# Patient Record
Sex: Male | Born: 1970
Health system: Southern US, Community
[De-identification: ages and names within clinical notes are randomized; demographics above are authoritative.]

## PROBLEM LIST (undated history)

## (undated) DIAGNOSIS — Z923 Personal history of irradiation: Secondary | ICD-10-CM

## (undated) DIAGNOSIS — C7951 Secondary malignant neoplasm of bone: Secondary | ICD-10-CM

## (undated) DIAGNOSIS — R200 Anesthesia of skin: Secondary | ICD-10-CM

## (undated) DIAGNOSIS — Z9221 Personal history of antineoplastic chemotherapy: Secondary | ICD-10-CM

## (undated) DIAGNOSIS — C9000 Multiple myeloma not having achieved remission: Secondary | ICD-10-CM

## (undated) HISTORY — PX: ANTERIOR CRUCIATE LIGAMENT REPAIR: SHX115

---

## 2001-03-02 ENCOUNTER — Encounter: Payer: Self-pay | Admitting: General Practice

## 2001-03-02 ENCOUNTER — Encounter: Admission: RE | Admit: 2001-03-02 | Discharge: 2001-03-02 | Payer: Self-pay | Admitting: General Practice

## 2007-06-12 ENCOUNTER — Emergency Department (HOSPITAL_COMMUNITY): Admission: EM | Admit: 2007-06-12 | Discharge: 2007-06-12 | Payer: Self-pay | Admitting: Emergency Medicine

## 2008-07-05 ENCOUNTER — Ambulatory Visit: Admission: RE | Admit: 2008-07-05 | Discharge: 2008-08-12 | Payer: Self-pay | Admitting: Orthopaedic Surgery

## 2015-06-23 ENCOUNTER — Encounter (HOSPITAL_COMMUNITY): Payer: Self-pay | Admitting: *Deleted

## 2015-06-23 ENCOUNTER — Emergency Department (HOSPITAL_COMMUNITY): Payer: Self-pay

## 2015-06-23 ENCOUNTER — Inpatient Hospital Stay (HOSPITAL_COMMUNITY)
Admission: EM | Admit: 2015-06-23 | Discharge: 2015-06-28 | DRG: 478 | Disposition: A | Discharge status: Home / Self Care | Payer: Self-pay | Attending: Internal Medicine | Admitting: Internal Medicine

## 2015-06-23 DIAGNOSIS — M545 Low back pain, unspecified: Secondary | ICD-10-CM

## 2015-06-23 DIAGNOSIS — M8458XA Pathological fracture in neoplastic disease, other specified site, initial encounter for fracture: Secondary | ICD-10-CM | POA: Diagnosis present

## 2015-06-23 DIAGNOSIS — M549 Dorsalgia, unspecified: Secondary | ICD-10-CM

## 2015-06-23 DIAGNOSIS — Z79899 Other long term (current) drug therapy: Secondary | ICD-10-CM

## 2015-06-23 DIAGNOSIS — D630 Anemia in neoplastic disease: Secondary | ICD-10-CM | POA: Diagnosis present

## 2015-06-23 DIAGNOSIS — D649 Anemia, unspecified: Secondary | ICD-10-CM | POA: Diagnosis present

## 2015-06-23 DIAGNOSIS — R270 Ataxia, unspecified: Secondary | ICD-10-CM | POA: Diagnosis present

## 2015-06-23 DIAGNOSIS — T380X5A Adverse effect of glucocorticoids and synthetic analogues, initial encounter: Secondary | ICD-10-CM | POA: Diagnosis present

## 2015-06-23 DIAGNOSIS — G9520 Unspecified cord compression: Secondary | ICD-10-CM | POA: Diagnosis present

## 2015-06-23 DIAGNOSIS — D72829 Elevated white blood cell count, unspecified: Secondary | ICD-10-CM | POA: Diagnosis present

## 2015-06-23 DIAGNOSIS — C7951 Secondary malignant neoplasm of bone: Principal | ICD-10-CM | POA: Diagnosis present

## 2015-06-23 DIAGNOSIS — D492 Neoplasm of unspecified behavior of bone, soft tissue, and skin: Secondary | ICD-10-CM | POA: Diagnosis present

## 2015-06-23 DIAGNOSIS — K5900 Constipation, unspecified: Secondary | ICD-10-CM | POA: Diagnosis not present

## 2015-06-23 DIAGNOSIS — D638 Anemia in other chronic diseases classified elsewhere: Secondary | ICD-10-CM | POA: Diagnosis present

## 2015-06-23 DIAGNOSIS — M4804 Spinal stenosis, thoracic region: Secondary | ICD-10-CM | POA: Diagnosis present

## 2015-06-23 DIAGNOSIS — Z87891 Personal history of nicotine dependence: Secondary | ICD-10-CM

## 2015-06-23 MED ORDER — GABAPENTIN 300 MG PO CAPS
300.0000 mg | ORAL_CAPSULE | Freq: Once | ORAL | Status: AC
  Administered 2015-06-23: 300 mg via ORAL
  Filled 2015-06-23: qty 1
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
## 2015-06-23 NOTE — ED Notes (Signed)
Pt states he is NOT claustrophobic
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
## 2015-06-23 NOTE — ED Notes (Signed)
Pt returned from MRI °

## 2015-06-24 ENCOUNTER — Emergency Department (HOSPITAL_COMMUNITY): Payer: Self-pay

## 2015-06-24 ENCOUNTER — Observation Stay (HOSPITAL_COMMUNITY): Payer: Self-pay

## 2015-06-24 ENCOUNTER — Emergency Department (HOSPITAL_COMMUNITY): Payer: No Typology Code available for payment source

## 2015-06-24 ENCOUNTER — Encounter (HOSPITAL_COMMUNITY): Payer: Self-pay | Admitting: Family Medicine

## 2015-06-24 DIAGNOSIS — D630 Anemia in neoplastic disease: Secondary | ICD-10-CM | POA: Diagnosis present

## 2015-06-24 DIAGNOSIS — D72829 Elevated white blood cell count, unspecified: Secondary | ICD-10-CM | POA: Diagnosis present

## 2015-06-24 DIAGNOSIS — C7951 Secondary malignant neoplasm of bone: Secondary | ICD-10-CM | POA: Diagnosis present

## 2015-06-24 DIAGNOSIS — D492 Neoplasm of unspecified behavior of bone, soft tissue, and skin: Secondary | ICD-10-CM

## 2015-06-24 LAB — COMPREHENSIVE METABOLIC PANEL
ALBUMIN: 4.2 g/dL (ref 3.5–5.0)
ALK PHOS: 40 U/L (ref 38–126)
ALT: 18 U/L (ref 17–63)
ANION GAP: 8 (ref 5–15)
AST: 24 U/L (ref 15–41)
BUN: 13 mg/dL (ref 6–20)
CALCIUM: 9.9 mg/dL (ref 8.9–10.3)
CO2: 23 mmol/L (ref 22–32)
Chloride: 109 mmol/L (ref 101–111)
Creatinine, Ser: 0.96 mg/dL (ref 0.61–1.24)
GFR calc Af Amer: >60 mL/min (ref >60)
GFR calc non Af Amer: >60 mL/min (ref >60)
GLUCOSE: 99 mg/dL (ref 65–99)
Potassium: 4.1 mmol/L (ref 3.5–5.1)
SODIUM: 140 mmol/L (ref 135–145)
Total Bilirubin: 1 mg/dL (ref 0.3–1.2)
Total Protein: 6.7 g/dL (ref 6.5–8.1)

## 2015-06-24 LAB — APTT: APTT: 28 s (ref 24–37)

## 2015-06-24 LAB — CBC WITH DIFFERENTIAL/PLATELET
BASOS PCT: 0 %
Basophils Absolute: 0 10*3/uL (ref 0.0–0.1)
Eosinophils Absolute: 0 10*3/uL (ref 0.0–0.7)
Eosinophils Relative: 0 %
HEMATOCRIT: 31.3 % — AB (ref 39.0–52.0)
Hemoglobin: 10.6 g/dL — ABNORMAL LOW (ref 13.0–17.0)
Lymphocytes Relative: 19 %
Lymphs Abs: 2.3 10*3/uL (ref 0.7–4.0)
MCH: 30.5 pg (ref 26.0–34.0)
MCHC: 33.9 g/dL (ref 30.0–36.0)
MCV: 90.2 fL (ref 78.0–100.0)
MONO ABS: 1.3 10*3/uL — AB (ref 0.1–1.0)
MONOS PCT: 10 %
NEUTROS ABS: 9 10*3/uL — AB (ref 1.7–7.7)
Neutrophils Relative %: 71 %
Platelets: 215 10*3/uL (ref 150–400)
RBC: 3.47 MIL/uL — ABNORMAL LOW (ref 4.22–5.81)
RDW: 13.6 % (ref 11.5–15.5)
WBC: 12.6 10*3/uL — ABNORMAL HIGH (ref 4.0–10.5)

## 2015-06-24 LAB — IRON AND TIBC
IRON: 116 ug/dL (ref 45–182)
SATURATION RATIOS: 45 % — AB (ref 17.9–39.5)
TIBC: 256 ug/dL (ref 250–450)
UIBC: 140 ug/dL

## 2015-06-24 LAB — FERRITIN: FERRITIN: 307 ng/mL (ref 24–336)

## 2015-06-24 LAB — PROTIME-INR
INR: 1.14 (ref 0.00–1.49)
Prothrombin Time: 14.8 seconds (ref 11.6–15.2)

## 2015-06-24 LAB — RETICULOCYTES
RBC.: 3.3 MIL/uL — ABNORMAL LOW (ref 4.22–5.81)
RETIC CT PCT: 1.2 % (ref 0.4–3.1)
Retic Count, Absolute: 39.6 10*3/uL (ref 19.0–186.0)

## 2015-06-24 LAB — LACTATE DEHYDROGENASE: LDH: 187 U/L (ref 98–192)

## 2015-06-24 LAB — PSA: PSA: 0.78 ng/mL (ref 0.00–4.00)

## 2015-06-24 LAB — ABO/RH: ABO/RH(D): A POS

## 2015-06-24 MED ORDER — DIATRIZOATE MEGLUMINE & SODIUM 66-10 % PO SOLN
ORAL | Status: AC
  Filled 2015-06-24: qty 30

## 2015-06-24 MED ORDER — OXYCODONE HCL 5 MG PO TABS
5.0000 mg | ORAL_TABLET | ORAL | Status: DC | PRN
  Administered 2015-06-24 (×2): 5 mg via ORAL
  Filled 2015-06-24 (×3): qty 1

## 2015-06-24 MED ORDER — DEXAMETHASONE SODIUM PHOSPHATE 4 MG/ML IJ SOLN
4.0000 mg | Freq: Four times a day (QID) | INTRAMUSCULAR | Status: DC
  Administered 2015-06-24 – 2015-06-28 (×15): 4 mg via INTRAVENOUS
  Filled 2015-06-24 (×16): qty 1

## 2015-06-24 MED ORDER — ACETAMINOPHEN 650 MG RE SUPP: 650.0000 mg | Freq: Four times a day (QID) | RECTAL | Status: DC | PRN

## 2015-06-24 MED ORDER — GADOBENATE DIMEGLUMINE 529 MG/ML IV SOLN
20.0000 mL | Freq: Once | INTRAVENOUS | Status: AC | PRN
  Administered 2015-06-24: 20 mL via INTRAVENOUS

## 2015-06-24 MED ORDER — ENOXAPARIN SODIUM 40 MG/0.4ML ~~LOC~~ SOLN
40.0000 mg | SUBCUTANEOUS | Status: DC
  Administered 2015-06-26 – 2015-06-28 (×3): 40 mg via SUBCUTANEOUS
  Filled 2015-06-24 (×3): qty 0.4

## 2015-06-24 MED ORDER — ACETAMINOPHEN 325 MG PO TABS: 650.0000 mg | ORAL_TABLET | Freq: Four times a day (QID) | ORAL | Status: DC | PRN

## 2015-06-24 MED ORDER — HYDROMORPHONE HCL 1 MG/ML IJ SOLN
1.0000 mg | INTRAMUSCULAR | Status: AC | PRN
  Administered 2015-06-24: 1 mg via INTRAVENOUS
  Filled 2015-06-24: qty 1

## 2015-06-24 MED ORDER — ONDANSETRON HCL 4 MG/2ML IJ SOLN: 4.0000 mg | Freq: Three times a day (TID) | INTRAMUSCULAR | Status: AC | PRN

## 2015-06-24 MED ORDER — IOPAMIDOL (ISOVUE-300) INJECTION 61%
INTRAVENOUS | Status: AC
  Administered 2015-06-24: 100 mL
  Filled 2015-06-24: qty 100

## 2015-06-24 MED ORDER — DEXAMETHASONE SODIUM PHOSPHATE 4 MG/ML IJ SOLN
4.0000 mg | Freq: Two times a day (BID) | INTRAMUSCULAR | Status: DC
  Administered 2015-06-24: 4 mg via INTRAVENOUS
  Filled 2015-06-24: qty 1

## 2015-06-24 MED ORDER — IBUPROFEN 200 MG PO TABS: 600.0000 mg | ORAL_TABLET | Freq: Four times a day (QID) | ORAL | Status: DC | PRN

## 2015-06-24 MED ORDER — SENNOSIDES-DOCUSATE SODIUM 8.6-50 MG PO TABS
1.0000 | ORAL_TABLET | Freq: Every evening | ORAL | Status: DC | PRN
  Administered 2015-06-27: 1 via ORAL
  Filled 2015-06-24: qty 1

## 2015-06-24 NOTE — Progress Notes (Addendum)
Patient ID: Bradley Hunt, male   DOB: Apr 07, 1970, 45 y.o.   MRN: [id number]  PROGRESS NOTE    Bradley Hunt  OFV:[id number] DOB: 04-Nov-1970 DOA: 06/23/2015  PCP: Barbette Merino, MD   Brief Narrative:  45 y.o. male with no signficant past medical history who presented to [facility] with back pain, ataxia and leg numbness for past 1 month prior to this admission. Patient has been seen by primary care physician who prescribed NSAIDs and exercise but the pain has gotten progressively worse to the point where patient could not ambulate normally and it interfered with his sleep. Patient reported numbness in the trunk area, abdomen and has had fecal incontinence. His primary care physician subsequently prescribed Decadron and referred him to ER for evaluation.  Findings on MRI suggestive of osseous metastatic disease with pathologic fracture in T6. Neurosurgery consulted by ED physician. We also place consult for interventional radiology if there is a possibility to do bone marrow biopsy to establish primary diagnosis.  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 - Will order SPEP, UPEP - Based on MRI multiple metastatic lesions in the bones with pathologic fracture in T6 area  - Because of the pain and because of neurological compromise specifically numbness in the truncal area and reports of fecal incontinence we will use Decadron 4 mg IV every 12 hours - Appreciate neurosurgery consult and recommendations - Obtain consult with interventional radiology to see if bone marrow biopsy possible - Continue pain management efforts - Addendum 1:45 pm - spoke with dr. Kathyrn Sheriff, fair degree of spinal cord compression so laminectomy better and no need for IR so canceled consult for IR bone marrow  biopsy; also increased decadron to Q 6 hours regimen    DVT prophylaxis: Lovenox subcutaneous Code Status: full code  Family Communication: Family not at the bedside Disposition Plan: Home once patient evaluated by neurosurgery and physical therapy   Consultants:   Neurosurgery   Procedures:   None   Antimicrobials:   None    Subjective: Other than pain patient feels pretty okay. His pain is relatively controlled with current analgesia.  Objective: Filed Vitals:   06/24/15 0530 06/24/15 0545 06/24/15 0615 06/24/15 0651  BP: 125/84 125/86 148/97 137/85  Pulse: 54 56 58 57  Temp:    97.8 F (36.6 C)  TempSrc:    Oral  Resp:      SpO2: 100% 100% 100% 100%   No intake or output data in the 24 hours ending 06/24/15 0909 There were no vitals filed for this visit.  Examination:  General exam: Appears calm and comfortable  Respiratory system: Clear to auscultation. Respiratory effort normal. Cardiovascular system: S1 & S2 heard, RRR. No JVD, murmurs, rubs, gallops or clicks. No pedal edema. Gastrointestinal system: Abdomen is nondistended, soft and nontender. No organomegaly or masses felt. Normal bowel sounds heard. Central nervous system: Alert and oriented. No focal neurological deficits. Able to lift upper and lower extremities, no sensation loss in UE or LE; he does have numbness in the trunk area, abdomen. Extremities: Symmetric 5 x 5 power. Skin: No rashes, lesions or ulcers Psychiatry: Judgement and insight appear normal. Mood & affect appropriate.   Data Reviewed: I have personally reviewed following labs and imaging studies  CBC:  Recent Labs Lab 06/24/15 0437  WBC 12.6*  NEUTROABS 9.0*  HGB 10.6*  HCT 31.3*  MCV 90.2  PLT 509   Basic Metabolic Panel:  Recent Labs Lab 06/24/15 0437  NA 140  K 4.1  CL 109  CO2 23  GLUCOSE 99  BUN 13  CREATININE 0.96  CALCIUM 9.9   GFR: CrCl cannot be calculated (Unknown ideal weight.). Liver Function  Tests:  Recent Labs Lab 06/24/15 0437  AST 24  ALT 18  ALKPHOS 40  BILITOT 1.0  PROT 6.7  ALBUMIN 4.2   No results for input(s): LIPASE, AMYLASE in the last 168 hours. No results for input(s): AMMONIA in the last 168 hours. Coagulation Profile: No results for input(s): INR, PROTIME in the last 168 hours. Cardiac Enzymes: No results for input(s): CKTOTAL, CKMB, CKMBINDEX, TROPONINI in the last 168 hours. BNP (last 3 results) No results for input(s): PROBNP in the last 8760 hours. HbA1C: No results for input(s): HGBA1C in the last 72 hours. CBG: No results for input(s): GLUCAP in the last 168 hours. Lipid Profile: No results for input(s): CHOL, HDL, LDLCALC, TRIG, CHOLHDL, LDLDIRECT in the last 72 hours. Thyroid Function Tests: No results for input(s): TSH, T4TOTAL, FREET4, T3FREE, THYROIDAB in the last 72 hours. Anemia Panel: No results for input(s): VITAMINB12, FOLATE, FERRITIN, TIBC, IRON, RETICCTPCT in the last 72 hours. Urine analysis: No results found for: COLORURINE, APPEARANCEUR, LABSPEC, PHURINE, GLUCOSEU, HGBUR, BILIRUBINUR, KETONESUR, PROTEINUR, UROBILINOGEN, NITRITE, LEUKOCYTESUR Sepsis Labs: _0 (procalcitonin:4,lacticidven:4)   )No results found for this or any previous visit (from the past 240 hour(s)).    Radiology Studies: Mr Thoracic Spine W Wo Contrast 06/24/2015  ADDENDUM REPORT: 06/24/2015 07:56 ADDENDUM: Upon further review, there is a probable additional discrete lesion within the right posterior aspect of the T1 vertebral body measuring approximately 15 mm (series 3, image 7). Additional lesion within the right superior aspect of the T11 vertebral body measures approximately 17 mm (series 3, image 7). Probable tiny 7 mm lesion within the posterior aspect of T8 (series 3, image 8). No associated pathologic fracture or extra osseous extension of tumor with these additional lesions. In fact, heterogeneous marrow signal intensity seen on axial merge  sequences is highly concerning for diffuse osseous metastases even though these are not discretely delineated on STIR and post-contrast sequences. Probable involvement of the bilateral ribs noted. Benign hemangioma and with mild chronic height loss noted at T4.   06/24/2015  1. Abnormal appearance of the T6 vertebral body, highly suspicious for possible osseous metastasis. Associated pathologic fracture with up to 30% height loss. There is associated abnormal soft tissue density within the ventral epidural space, concerning for epidural tumor. Associated fairly severe canal stenosis. Question mild edema within the thoracic spinal cord related to compression at this level. 2. Question additional lesion involving the T9 vertebral body as above. No other definite focal lesions within the thoracic spine. Electronically Signed: By: Jeannine Boga M.D. On: 06/24/2015 03:41   Mr Lumbar Spine W Wo Contrast 06/24/2015  1. Focal osseous lesion with abnormal enhancement involving the right pedicle of L3, suspicious for possible osseous metastasis given the findings in the thoracic spine. Question additional focal lesion within the right iliac wing as above. No pathologic fracture. 2. Right subarticular disc protrusion at L4-5, impinging upon the right L5 nerve root in the right lateral recess. 3. Shallow central disc protrusion at L5-S1 without stenosis or neural impingement.     Scheduled Meds: . diatrizoate meglumine-sodium      . enoxaparin (LOVENOX) injection  40 mg Subcutaneous Q24H  . iopamidol       Continuous Infusions:      Time spent: 25 minutes  Greater than 50% of the time spent on counseling and coordinating the care.   Leisa Lenz, MD [facility] Pager [phone]  If 7PM-7AM, please contact night-coverage [email or web address] Password [email or web address] 06/24/2015, 9:09 AM

## 2015-06-24 NOTE — Consult Note (Signed)
CC:  Chief Complaint  Patient presents with  . Back Pain    HPI: Bradley Hunt is a 45 y.o. male sent to the ED by his PCP after recent outpatient MRI demonstrated T6 bony lesion with spinal cord compression. His history begins about two months ago when he began experiencing some "tightness" around his chest just below the nipple level, with decreased sensation below this area down into the abdomen and legs. Over the last two weeks he says symptoms have significantly worsened. He began to have difficulty with his balance, unable to walk in a straight line. He does report some back pain, although it is not exacerbated by movement nor is it his primary complaint. He reports two episodes in which he felt urgency to defecate but couldn't hold it. He does not report any urinary/bladder symptoms of urgency or frequency. He has no significant medical history.  PMH: History reviewed. No pertinent past medical history.  PSH: Past Surgical History  Procedure Laterality Date  . Anterior cruciate ligament repair      SH: Social History  Substance Use Topics  . Smoking status: Former Research scientist (life sciences)  . Smokeless tobacco: None  . Alcohol Use: No    MEDS: Prior to Admission medications   Medication Sig Start Date End Date Taking? Authorizing Provider  dexamethasone (DECADRON) 2 MG tablet Take 6 mg by mouth daily.   Yes Historical Provider, MD  ibuprofen (ADVIL,MOTRIN) 200 MG tablet Take 200-400 mg by mouth every 6 (six) hours as needed for moderate pain.   Yes Historical Provider, MD    ALLERGY: No Known Allergies  ROS: ROS  NEUROLOGIC EXAM: Awake, alert, oriented Memory and concentration grossly intact Speech fluent, appropriate CN grossly intact Motor exam: Upper Extremities Deltoid Bicep Tricep Grip  Right 5/5 5/5 5/5 5/5  Left 5/5 5/5 5/5 5/5   Lower Extremity IP Quad PF DF EHL  Right 5/5 5/5 5/5 5/5 5/5  Left 4+/5 5/5 5/5 5/5 5/5   Sensation decreased to LT below T6  bilaterally  IMGAING: MRI T spine demonstrates mass involving the body of T6 with extension into the ventral epidural space and resultant severe spinal cord compression with bright T2 signal change within the cord.  CT chest also demonstrates lytic appearance of the T6 vertebral body with possible extension into the posterior elements.  IMPRESSION: - 45 y.o. 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 The likely diagnosis of metastatic disease was discussed. I did recommend surgical decompression given the degree of compression and the need for diagnosis. Risks of surgery were discussed including spinal cord injury leading to worsening weakness/paralysis, bladder dysfunction, bleeding, infection, csf leak. The patient appeared to understand our discussion and is willing to proceed. All questions were answered.

## 2015-06-24 NOTE — H&P (Signed)
History and Physical  Patient Name: Bradley Hunt     [id number]    DOB: [birth year]    DOA: 06/23/2015 PCP: Barbette Merino, MD   Patient coming from: Home  Chief Complaint: Ataxia and back pain  HPI: Bradley Hunt is a 45 y.o. male with no signficant past medical history who presents with back pain and ataxia and leg numbness.  The patient was in his usual health until about 1 month ago when he developed upper back pain, radiating around and "tight all across my chest" associated with some numbness, difficulty falling asleep.  He saw his PCP who prescribed NSAIDs and exercise (he has a history of some back pain that has been treated with injections in the past), but the pain was still severe, and in fact progressed.  Now over the last two weeks, he has had ataxia, his co-workers have noticed he "walks like I'm drunk", he has developed leg weakness and numbness to the trunk bilaterally and across the abdomen, anterior and inner thighs, and he has had fecal incontinence.  His PCP prescribed dexamethasone yesterday and today told him to go to the ER for an MRI.  ED course: -Afebrile, hemodynamically stable -Na 140, K 4.1, Cr 0.96, WBC 12.6K, Hgb 10.6 normocytic -An MRI of the thoracic and lumbar spine showed T6 compression fracture with height loss and severe canal stenosis, multiple foci of abnormal enhancement concerning for osseous metastasis  The case was discussed with Dr. Kathyrn Sheriff who recommended Medicine admission for cancer of unknown primary workup.     Review of Systems:  Pt complains of back pain, leg weakness, ataxia, fecal incontinence, numbness. Pt denies any weight loss, cough, night sweats.  All other systems negative except as just noted or noted in the history of present illness.    History reviewed. No pertinent past medical history.  Past Surgical History  Procedure Laterality Date  . Anterior cruciate ligament repair      Social History: Patient lives with his  wife and three children ages 38, 86, and 3.  The patient walks unassisted.  He is a Administrator.  He was born in [place], moved here at age 52.  Former smoker.    No Known Allergies  Family history: family history is negative for Cancer.  Prior to Admission medications   Medication Sig Start Date End Date Taking? Authorizing Provider  dexamethasone (DECADRON) 2 MG tablet Take 6 mg by mouth daily.   Yes Historical Provider, MD  ibuprofen (ADVIL,MOTRIN) 200 MG tablet Take 200-400 mg by mouth every 6 (six) hours as needed for moderate pain.   Yes Historical Provider, MD       Physical Exam: BP 148/97 mmHg  Pulse 58  Temp(Src) 98.3 F (36.8 C) (Oral)  Resp 18  SpO2 100% General appearance: Well-developed, adult male, alert and in no acute distress.   Eyes: Anicteric, conjunctiva pink, lids and lashes normal.     ENT: No nasal deformity, discharge, or epistaxis.  OP moist without lesions.  No thyromegaly. Skin: Warm and dry.  No suspicious rashes or lesions. Cardiac: RRR, nl S1-S2, no murmurs appreciated.  Respiratory: Normal respiratory rate and rhythm.  CTAB without rales or wheezes. Abdomen: Abdomen soft without rigidity.  No TTP. No ascites, distension.   MSK: No deformities or effusions. Neuro: Cranial nerves normal.  Numbness to light touch over abdomen, anterior thighs.  Patellar reflexes 2_ and symmetric.  Sensorium intact and responding to questions, attention normal.  Speech is fluent.  Upper extremity  strength 5/5 and symmetric, lower extremity strength feels same to me but patient feels weakness.   Psych: Behavior appropriate.  Affect normal.  No evidence of aural or visual hallucinations or delusions.       Labs on Admission:  I have personally reviewed following labs and imaging studies: CBC:  Recent Labs Lab 06/24/15 0437  WBC 12.6*  NEUTROABS 9.0*  HGB 10.6*  HCT 31.3*  MCV 90.2  PLT 123456   Basic Metabolic Panel:  Recent Labs Lab 06/24/15 0437  NA 140  K  4.1  CL 109  CO2 23  GLUCOSE 99  BUN 13  CREATININE 0.96  CALCIUM 9.9   GFR: CrCl cannot be calculated (Unknown ideal weight.). Liver Function Tests:  Recent Labs Lab 06/24/15 0437  AST 24  ALT 18  ALKPHOS 40  BILITOT 1.0  PROT 6.7  ALBUMIN 4.2    Radiological Exams on Admission: Personally reviewed: Mr Thoracic Spine W Wo Contrast  06/24/2015  CLINICAL DATA:  Initial evaluation for acute back pain. EXAM: MRI THORACIC SPINE WITHOUT AND WITH CONTRAST TECHNIQUE: Multiplanar and multiecho pulse sequences of the thoracic spine were obtained without and with intravenous contrast. CONTRAST:  13mL MULTIHANCE GADOBENATE DIMEGLUMINE 529 MG/ML IV SOLN COMPARISON:  None available. FINDINGS: Vertebral bodies are normally aligned with preservation of the normal thoracic kyphosis. No listhesis or malalignment. There is abnormal height loss with heterogeneity and involving the T6 vertebral body. Abnormal posterior convex bowing of the posterior vertebral body contour more with abnormal soft tissue density within the ventral epidural space. This enhances mildly on post gadolinium sequence. Finding suspicious for possible osseous metastasis with associated pathologic fracture. There is associated 30% height loss. Abnormal soft tissue density within the ventral epidural space abuts the thoracic spinal cord and results in moderate to severe canal stenosis. Thecal sac measures approximately 8-9 mm in AP diameter at this level. Probable extension of tumor and to the posterior elements of T6, most evident within the right pedicle and lamina. Extension of tumor into the T5-6 neural foramina, particularly on the right (series 3, image 7). There is suggestion of possible additional ovoid lesion within the T9 vertebral body (series 3, image 11). This measures approximately 15 mm and spans the entirety of the vertebral body. No other definite osseous lesions identified. Possible hemangioma noted within the T4  vertebral body. Question of faint edema/myelomalacia within the thoracic spinal cord at the level of T6 related to compression (Series 3, image 10). Signal intensity within the thoracic spinal cord is otherwise normal. Paraspinous soft tissues demonstrate no other acute abnormality. Partially visualized lungs are clear. No significant degenerative changes within the thoracic spine. No focal disc herniation. No other significant stenosis. IMPRESSION: 1. Abnormal appearance of the T6 vertebral body, highly suspicious for possible osseous metastasis. Associated pathologic fracture with up to 30% height loss. There is associated abnormal soft tissue density within the ventral epidural space, concerning for epidural tumor. Associated fairly severe canal stenosis. Question mild edema within the thoracic spinal cord related to compression at this level. 2. Question additional lesion involving the T9 vertebral body as above. No other definite focal lesions within the thoracic spine. Electronically Signed   By: Jeannine Boga M.D.   On: 06/24/2015 03:41   Mr Lumbar Spine W Wo Contrast  06/24/2015  CLINICAL DATA:  Initial evaluation for acute back pain. EXAM: MRI LUMBAR SPINE WITHOUT AND WITH CONTRAST TECHNIQUE: Multiplanar and multiecho pulse sequences of the lumbar spine were obtained without and with  intravenous contrast. CONTRAST:  20 cc of MultiHance. COMPARISON:  Prior study from 06/18/2011. FINDINGS: Vertebral bodies are normally aligned with preservation of the normal lumbar lordosis. Vertebral body heights are well preserved. No fracture or malalignment. There is abnormal STIR signal intensity with enhancement involving the right pedicle of L3 (series 3, image 4), concerning for possible focal osseous lesion. This is not seen on previous MRI from 2013. Possible additional lesion within the right iliac wing adjacent to the SI joint (series 4, image 34). No other focal osseous lesions. Signal intensity within  the vertebral body bone marrow otherwise within normal limits. Vertebral body heights are preserved. No acute or chronic fracture. Degenerative endplate Schmorl's node present at the superior endplate of L5. Conus medullaris terminates at the L2 level. Signal intensity within the visualized cord is normal. Nerve roots of the cauda equina within normal limits. Paraspinous soft tissues demonstrate no acute abnormality. L1-2: No significant disc bulge or focal disc herniation. No stenosis. L2-3:  No disc herniation or significant disc bulge.  No stenosis. L3-4: No disc herniation are significant disc bulge. No significant stenosis. L4-5: Right subarticular disc protrusion encroaching upon the right lateral recess, impinging upon the transiting L5 nerve root (series 14, image 29). Moderate lateral recess stenosis with mild canal narrowing. Foramina remain widely patent. L5-S1: Shallow central disc protrusion with associated annular fissure. No significant stenosis or neural impingement. Foramina remain patent. IMPRESSION: 1. Focal osseous lesion with abnormal enhancement involving the right pedicle of L3, suspicious for possible osseous metastasis given the findings in the thoracic spine. Question additional focal lesion within the right iliac wing as above. No pathologic fracture. 2. Right subarticular disc protrusion at L4-5, impinging upon the right L5 nerve root in the right lateral recess. 3. Shallow central disc protrusion at L5-S1 without stenosis or neural impingement. Electronically Signed   By: Jeannine Boga M.D.   On: 06/24/2015 03:53       Assessment/Plan 1. Vertebral compression fracture with concern for bony metastasis: No family history of cancer and no localizing symptoms.  Anemia non-specific.    -CT chest abdomen and pelvis with IV contrast ordered -PSA ordered -Consult to Neurosurgery -Ibuprofen, acetaminophen, oxycodone, or hydromorphone PRN for pain   2. Leukocytosis:    Presumed reactive.  3. Anemia, unspecified type:  -Check iron studies, reticulocytes, SPEP    DVT prophylaxis: Lovenox  Code Status: FULL  Family Communication: None present  Disposition Plan: Anticipate evaluation by Neurosurgery today and disposition based on their plan.  CT imaging and lab work up pending. Consults called: Neurosurgery Admission status: Observation for now, med surg bed   Medical decision making: Patient seen at 6:20 AM on 06/24/2015.  The patient was discussed with Arlean Hopping, PA-C. What exists of the patient's chart was reviewed in depth.  Clinical condition: stable.        Edwin Dada [facility] Pager [phone]

## 2015-06-25 ENCOUNTER — Inpatient Hospital Stay (HOSPITAL_COMMUNITY): Payer: No Typology Code available for payment source

## 2015-06-25 ENCOUNTER — Inpatient Hospital Stay (HOSPITAL_COMMUNITY): Payer: Self-pay | Admitting: Anesthesiology

## 2015-06-25 ENCOUNTER — Encounter (HOSPITAL_COMMUNITY)
Admission: EM | Disposition: A | Discharge status: Home / Self Care | Payer: Self-pay | Source: Home / Self Care | Attending: Internal Medicine

## 2015-06-25 ENCOUNTER — Inpatient Hospital Stay (HOSPITAL_COMMUNITY): Payer: No Typology Code available for payment source | Admitting: Anesthesiology

## 2015-06-25 DIAGNOSIS — M8448XS Pathological fracture, other site, sequela: Secondary | ICD-10-CM

## 2015-06-25 DIAGNOSIS — G9520 Unspecified cord compression: Secondary | ICD-10-CM

## 2015-06-25 DIAGNOSIS — C9000 Multiple myeloma not having achieved remission: Secondary | ICD-10-CM

## 2015-06-25 HISTORY — PX: LAMINECTOMY: SHX219

## 2015-06-25 HISTORY — DX: Multiple myeloma not having achieved remission: C90.00

## 2015-06-25 LAB — POCT I-STAT 4, (NA,K, GLUC, HGB,HCT)
GLUCOSE: 123 mg/dL — AB (ref 65–99)
HEMATOCRIT: 25 % — AB (ref 39.0–52.0)
Hemoglobin: 8.5 g/dL — ABNORMAL LOW (ref 13.0–17.0)
POTASSIUM: 4.2 mmol/L (ref 3.5–5.1)
Sodium: 142 mmol/L (ref 135–145)

## 2015-06-25 LAB — SURGICAL PCR SCREEN
MRSA, PCR: NEGATIVE
STAPHYLOCOCCUS AUREUS: POSITIVE — AB

## 2015-06-25 LAB — PREPARE RBC (CROSSMATCH)

## 2015-06-25 SURGERY — LUMBAR LAMINECTOMY FOR TUMOR
Anesthesia: General | Site: Back

## 2015-06-25 MED ORDER — LACTATED RINGERS IV SOLN
INTRAVENOUS | Status: DC | PRN
  Administered 2015-06-25 (×3): via INTRAVENOUS

## 2015-06-25 MED ORDER — OXYCODONE HCL 5 MG PO TABS: 5.0000 mg | ORAL_TABLET | Freq: Once | ORAL | Status: DC | PRN

## 2015-06-25 MED ORDER — SUGAMMADEX SODIUM 500 MG/5ML IV SOLN
INTRAVENOUS | Status: AC
  Filled 2015-06-25: qty 5

## 2015-06-25 MED ORDER — MIDAZOLAM HCL 2 MG/2ML IJ SOLN
INTRAMUSCULAR | Status: DC | PRN
  Administered 2015-06-25: 2 mg via INTRAVENOUS

## 2015-06-25 MED ORDER — BUPIVACAINE HCL 0.5 % IJ SOLN
INTRAMUSCULAR | Status: DC | PRN
  Administered 2015-06-25: 2.5 mL

## 2015-06-25 MED ORDER — ONDANSETRON HCL 4 MG/2ML IJ SOLN
INTRAMUSCULAR | Status: AC
  Filled 2015-06-25: qty 2

## 2015-06-25 MED ORDER — CHLORHEXIDINE GLUCONATE CLOTH 2 % EX PADS
6.0000 | MEDICATED_PAD | Freq: Every day | CUTANEOUS | Status: DC
Start: 2015-06-25 — End: 2015-06-28
  Administered 2015-06-26 – 2015-06-28 (×3): 6 via TOPICAL

## 2015-06-25 MED ORDER — CEFAZOLIN SODIUM-DEXTROSE 2-4 GM/100ML-% IV SOLN
INTRAVENOUS | Status: AC
  Administered 2015-06-25: 2 g via INTRAVENOUS
  Filled 2015-06-25: qty 100

## 2015-06-25 MED ORDER — FENTANYL CITRATE (PF) 250 MCG/5ML IJ SOLN
INTRAMUSCULAR | Status: AC
  Filled 2015-06-25: qty 5

## 2015-06-25 MED ORDER — 0.9 % SODIUM CHLORIDE (POUR BTL) OPTIME
TOPICAL | Status: DC | PRN
  Administered 2015-06-25: 1000 mL

## 2015-06-25 MED ORDER — DEXAMETHASONE SODIUM PHOSPHATE 10 MG/ML IJ SOLN
INTRAMUSCULAR | Status: AC
  Filled 2015-06-25: qty 1

## 2015-06-25 MED ORDER — ONDANSETRON HCL 4 MG/2ML IJ SOLN: 4.0000 mg | Freq: Once | INTRAMUSCULAR | Status: DC | PRN

## 2015-06-25 MED ORDER — LIDOCAINE HCL (CARDIAC) 20 MG/ML IV SOLN
INTRAVENOUS | Status: DC | PRN
  Administered 2015-06-25: 40 mg via INTRATRACHEAL

## 2015-06-25 MED ORDER — ROCURONIUM BROMIDE 100 MG/10ML IV SOLN
INTRAVENOUS | Status: DC | PRN
  Administered 2015-06-25 (×2): 50 mg via INTRAVENOUS
  Administered 2015-06-25: 25 mg via INTRAVENOUS
  Administered 2015-06-25: 50 mg via INTRAVENOUS

## 2015-06-25 MED ORDER — THROMBIN 20000 UNITS EX SOLR
CUTANEOUS | Status: DC | PRN
  Administered 2015-06-25: 20 mL via TOPICAL

## 2015-06-25 MED ORDER — OXYCODONE HCL 5 MG/5ML PO SOLN: 5.0000 mg | Freq: Once | ORAL | Status: DC | PRN

## 2015-06-25 MED ORDER — PROPOFOL 10 MG/ML IV BOLUS
INTRAVENOUS | Status: DC | PRN
  Administered 2015-06-25: 200 mg via INTRAVENOUS

## 2015-06-25 MED ORDER — MORPHINE SULFATE (PF) 2 MG/ML IV SOLN
2.0000 mg | INTRAVENOUS | Status: DC | PRN
  Administered 2015-06-25 (×2): 4 mg via INTRAVENOUS
  Administered 2015-06-25: 2 mg via INTRAVENOUS
  Administered 2015-06-26 (×4): 4 mg via INTRAVENOUS
  Administered 2015-06-27 (×2): 2 mg via INTRAVENOUS
  Filled 2015-06-25 (×7): qty 2
  Filled 2015-06-25 (×2): qty 1

## 2015-06-25 MED ORDER — LIDOCAINE 2% (20 MG/ML) 5 ML SYRINGE
INTRAMUSCULAR | Status: AC
  Filled 2015-06-25: qty 10

## 2015-06-25 MED ORDER — DEXAMETHASONE SODIUM PHOSPHATE 10 MG/ML IJ SOLN
INTRAMUSCULAR | Status: DC | PRN
  Administered 2015-06-25: 10 mg via INTRAVENOUS

## 2015-06-25 MED ORDER — PROPOFOL 10 MG/ML IV BOLUS
INTRAVENOUS | Status: AC
  Filled 2015-06-25: qty 20

## 2015-06-25 MED ORDER — SODIUM CHLORIDE 0.9 % IV SOLN
INTRAVENOUS | Status: DC | PRN
  Administered 2015-06-25: 14:00:00 via INTRAVENOUS

## 2015-06-25 MED ORDER — FENTANYL CITRATE (PF) 250 MCG/5ML IJ SOLN
INTRAMUSCULAR | Status: DC | PRN
  Administered 2015-06-25 (×2): 50 ug via INTRAVENOUS
  Administered 2015-06-25: 100 ug via INTRAVENOUS
  Administered 2015-06-25: 200 ug via INTRAVENOUS
  Administered 2015-06-25: 150 ug via INTRAVENOUS
  Administered 2015-06-25: 100 ug via INTRAVENOUS
  Administered 2015-06-25 (×2): 50 ug via INTRAVENOUS
  Administered 2015-06-25 (×2): 100 ug via INTRAVENOUS

## 2015-06-25 MED ORDER — ROCURONIUM BROMIDE 50 MG/5ML IV SOLN
INTRAVENOUS | Status: AC
  Filled 2015-06-25: qty 2

## 2015-06-25 MED ORDER — THROMBIN 5000 UNITS EX SOLR
OROMUCOSAL | Status: DC | PRN
  Administered 2015-06-25: 5 mL via TOPICAL

## 2015-06-25 MED ORDER — SUGAMMADEX SODIUM 500 MG/5ML IV SOLN
INTRAVENOUS | Status: DC | PRN
  Administered 2015-06-25: 400 mg via INTRAVENOUS

## 2015-06-25 MED ORDER — BACITRACIN 50000 UNITS IM SOLR
INTRAMUSCULAR | Status: DC | PRN
  Administered 2015-06-25: 500 mL

## 2015-06-25 MED ORDER — ROCURONIUM BROMIDE 50 MG/5ML IV SOLN
INTRAVENOUS | Status: AC
  Filled 2015-06-25: qty 1

## 2015-06-25 MED ORDER — LIDOCAINE-EPINEPHRINE 1 %-1:100000 IJ SOLN
INTRAMUSCULAR | Status: DC | PRN
  Administered 2015-06-25: 2.5 mL via INTRADERMAL

## 2015-06-25 MED ORDER — ONDANSETRON HCL 4 MG/2ML IJ SOLN
INTRAMUSCULAR | Status: DC | PRN
  Administered 2015-06-25: 4 mg via INTRAVENOUS

## 2015-06-25 MED ORDER — MUPIROCIN 2 % EX OINT
1.0000 | TOPICAL_OINTMENT | Freq: Two times a day (BID) | CUTANEOUS | Status: DC
Start: 2015-06-25 — End: 2015-06-28
  Administered 2015-06-25 – 2015-06-28 (×5): 1 via NASAL
  Filled 2015-06-25: qty 22

## 2015-06-25 MED ORDER — MIDAZOLAM HCL 2 MG/2ML IJ SOLN
INTRAMUSCULAR | Status: AC
  Filled 2015-06-25: qty 2

## 2015-06-25 MED ORDER — HYDROMORPHONE HCL 1 MG/ML IJ SOLN: 0.2500 mg | INTRAMUSCULAR | Status: DC | PRN

## 2015-06-25 SURGICAL SUPPLY — 74 items
APL SKNCLS STERI-STRIP NONHPOA (GAUZE/BANDAGES/DRESSINGS)
BAG DECANTER FOR FLEXI CONT (MISCELLANEOUS) ×3 IMPLANT
BENZOIN TINCTURE PRP APPL 2/3 (GAUZE/BANDAGES/DRESSINGS) IMPLANT
BLADE SURG 11 STRL SS (BLADE) ×3 IMPLANT
BLADE ULTRA TIP 2M (BLADE) IMPLANT
BRUSH SCRUB EZ 1% IODOPHOR (MISCELLANEOUS) ×3 IMPLANT
BUR ACRON 5.0MM COATED (BURR) IMPLANT
BUR MATCHSTICK NEURO 3.0 LAGG (BURR) IMPLANT
CANISTER SUCT 3000ML PPV (MISCELLANEOUS) ×3 IMPLANT
CLIP TI MEDIUM 6 (CLIP) IMPLANT
CLOSURE WOUND 1/4X4 (GAUZE/BANDAGES/DRESSINGS)
COVER MAYO STAND STRL (DRAPES) IMPLANT
DRAPE C-ARM 42X72 X-RAY (DRAPES) ×4 IMPLANT
DRAPE LAPAROTOMY 100X72X124 (DRAPES) IMPLANT
DRAPE MICROSCOPE LEICA (MISCELLANEOUS) ×2 IMPLANT
DRAPE POUCH INSTRU U-SHP 10X18 (DRAPES) ×3 IMPLANT
DRSG EMULSION OIL 3X3 NADH (GAUZE/BANDAGES/DRESSINGS) IMPLANT
DRSG OPSITE POSTOP 4X8 (GAUZE/BANDAGES/DRESSINGS) ×2 IMPLANT
ELECT REM PT RETURN 9FT ADLT (ELECTROSURGICAL) ×3
ELECTRODE REM PT RTRN 9FT ADLT (ELECTROSURGICAL) ×1 IMPLANT
GAUZE SPONGE 4X4 12PLY STRL (GAUZE/BANDAGES/DRESSINGS) IMPLANT
GAUZE SPONGE 4X4 16PLY XRAY LF (GAUZE/BANDAGES/DRESSINGS) IMPLANT
GLOVE BIO SURGEON STRL SZ 6.5 (GLOVE) ×1 IMPLANT
GLOVE BIO SURGEON STRL SZ7 (GLOVE) ×4 IMPLANT
GLOVE BIO SURGEONS STRL SZ 6.5 (GLOVE) ×1
GLOVE BIOGEL PI IND STRL 7.5 (GLOVE) ×1 IMPLANT
GLOVE BIOGEL PI INDICATOR 7.5 (GLOVE) ×2
GLOVE ECLIPSE 7.0 STRL STRAW (GLOVE) ×5 IMPLANT
GLOVE ECLIPSE 7.5 STRL STRAW (GLOVE) ×2 IMPLANT
GLOVE EXAM NITRILE LRG STRL (GLOVE) IMPLANT
GLOVE EXAM NITRILE MD LF STRL (GLOVE) IMPLANT
GLOVE EXAM NITRILE XL STR (GLOVE) IMPLANT
GLOVE EXAM NITRILE XS STR PU (GLOVE) IMPLANT
GLOVE INDICATOR 8.0 STRL GRN (GLOVE) ×2 IMPLANT
GOWN STRL REUS W/ TWL LRG LVL3 (GOWN DISPOSABLE) IMPLANT
GOWN STRL REUS W/ TWL XL LVL3 (GOWN DISPOSABLE) IMPLANT
GOWN STRL REUS W/TWL 2XL LVL3 (GOWN DISPOSABLE) IMPLANT
GOWN STRL REUS W/TWL LRG LVL3 (GOWN DISPOSABLE) ×6
GOWN STRL REUS W/TWL XL LVL3 (GOWN DISPOSABLE) ×3
HEMOSTAT POWDER KIT SURGIFOAM (HEMOSTASIS) ×3 IMPLANT
HEMOSTAT SURGICEL 2X14 (HEMOSTASIS) IMPLANT
KIT BASIN OR (CUSTOM PROCEDURE TRAY) ×3 IMPLANT
KIT ROOM TURNOVER OR (KITS) ×3 IMPLANT
LIQUID BAND (GAUZE/BANDAGES/DRESSINGS) ×2 IMPLANT
NDL SPNL 18GX3.5 QUINCKE PK (NEEDLE) ×2 IMPLANT
NDL SPNL 20GX3.5 QUINCKE YW (NEEDLE) IMPLANT
NDL SPNL 22GX3.5 QUINCKE BK (NEEDLE) IMPLANT
NEEDLE HYPO 22GX1.5 SAFETY (NEEDLE) ×3 IMPLANT
NEEDLE SPNL 18GX3.5 QUINCKE PK (NEEDLE) ×6 IMPLANT
NEEDLE SPNL 20GX3.5 QUINCKE YW (NEEDLE) ×3 IMPLANT
NEEDLE SPNL 22GX3.5 QUINCKE BK (NEEDLE) ×3 IMPLANT
NS IRRIG 1000ML POUR BTL (IV SOLUTION) ×3 IMPLANT
PACK LAMINECTOMY NEURO (CUSTOM PROCEDURE TRAY) ×3 IMPLANT
PAD ARMBOARD 7.5X6 YLW CONV (MISCELLANEOUS) ×9 IMPLANT
PATTIES SURGICAL .25X.25 (GAUZE/BANDAGES/DRESSINGS) IMPLANT
PATTIES SURGICAL .5 X3 (DISPOSABLE) ×3 IMPLANT
PATTIES SURGICAL 1/4 X 3 (GAUZE/BANDAGES/DRESSINGS) ×3 IMPLANT
RUBBERBAND STERILE (MISCELLANEOUS) ×4 IMPLANT
SEALER BIPOLAR AQUA 2.3 (INSTRUMENTS) ×2 IMPLANT
SPECIMEN JAR SMALL (MISCELLANEOUS) ×4 IMPLANT
SPONGE LAP 4X18 X RAY DECT (DISPOSABLE) IMPLANT
SPONGE NEURO XRAY DETECT 1X3 (DISPOSABLE) IMPLANT
SPONGE SURGIFOAM ABS GEL 100 (HEMOSTASIS) ×3 IMPLANT
STRIP CLOSURE SKIN 1/4X4 (GAUZE/BANDAGES/DRESSINGS) IMPLANT
SUT PROLENE 6 0 BV (SUTURE) IMPLANT
SUT VIC AB 0 CT1 18XCR BRD8 (SUTURE) ×1 IMPLANT
SUT VIC AB 0 CT1 8-18 (SUTURE) ×9
SUT VIC AB 2-0 CP2 18 (SUTURE) ×1 IMPLANT
SUT VICRYL 3-0 RB1 18 ABS (SUTURE) ×4 IMPLANT
TIP SONASTAR STD MISONIX 1.9 (TRAY / TRAY PROCEDURE) IMPLANT
TOWEL OR 17X24 6PK STRL BLUE (TOWEL DISPOSABLE) ×3 IMPLANT
TOWEL OR 17X26 10 PK STRL BLUE (TOWEL DISPOSABLE) ×3 IMPLANT
TRAY FOLEY W/METER SILVER 16FR (SET/KITS/TRAYS/PACK) IMPLANT
WATER STERILE IRR 1000ML POUR (IV SOLUTION) ×3 IMPLANT

## 2015-06-25 NOTE — Anesthesia Preprocedure Evaluation (Addendum)
Anesthesia Evaluation   Patient awake    Reviewed: Allergy & Precautions, NPO status , Patient's Chart, lab work & pertinent test results  Airway Mallampati: III  TM Distance: >3 FB Neck ROM: Full    Dental  (+) Teeth Intact, Dental Advisory Given   Pulmonary former smoker,    breath sounds clear to auscultation       Cardiovascular  Rhythm:Regular Rate:Normal     Neuro/Psych    GI/Hepatic   Endo/Other    Renal/GU      Musculoskeletal   Abdominal   Peds  Hematology   Anesthesia Other Findings   Reproductive/Obstetrics                           Anesthesia Physical Anesthesia Plan  ASA: III  Anesthesia Plan: General   Post-op Pain Management:    Induction: Intravenous  Airway Management Planned: Oral ETT  Additional Equipment: Arterial line  Intra-op Plan:   Post-operative Plan:   Informed Consent: I have reviewed the patients History and Physical, chart, labs and discussed the procedure including the risks, benefits and alternatives for the proposed anesthesia with the patient or authorized representative who has indicated his/her understanding and acceptance.   Dental advisory given  Plan Discussed with: CRNA and Anesthesiologist  Anesthesia Plan Comments:         Anesthesia Quick Evaluation

## 2015-06-25 NOTE — Transfer of Care (Signed)
Immediate Anesthesia Transfer of Care Note  Patient: [facility]  Procedure(s) Performed: Procedure(s): Thoracic six LAMINECTOMY RESECTION FOR TUMOR (N/A)  Patient Location: PACU  Anesthesia Type:General  Level of Consciousness: awake  Airway & Oxygen Therapy: Patient Spontanous Breathing and Patient connected to nasal cannula oxygen  Post-op Assessment: Report given to RN and Post -op Vital signs reviewed and stable  Post vital signs: Reviewed and stable  Last Vitals:  Filed Vitals:   06/25/15 1014 06/25/15 1432  BP: 130/86 125/81  Pulse: 77 74  Temp: 36.7 C 36.4 C  Resp: 16 10    Last Pain:  Filed Vitals:   06/25/15 1439  PainSc: 0-No pain         Complications: No apparent anesthesia complications

## 2015-06-25 NOTE — Progress Notes (Signed)
PT Cancellation Note  Patient Details Name: Bradley Hunt MRN: [id number] DOB: Dec 03, 1970   Cancelled Treatment:    Reason Eval/Treat Not Completed: Patient not medically ready, just returned from surgery, will evaluate tomorrow.    [place], [place] 06/25/2015, 3:45 PM

## 2015-06-25 NOTE — Progress Notes (Signed)
Pt for  24 urine collection will end at 1000 on a container with ice, also for back surgery with consent, NPO midnight, CHG and PCR done, given 1x pain meds oxycodone before midnight and relieved, no s/s of distress noted.

## 2015-06-25 NOTE — Anesthesia Procedure Notes (Signed)
Procedure Name: Intubation Date/Time: 06/25/2015 11:29 AM Performed by: Marinda Elk A Pre-anesthesia Checklist: Patient identified, Emergency Drugs available, Suction available, Patient being monitored and Timeout performed Patient Re-evaluated:Patient Re-evaluated prior to inductionOxygen Delivery Method: Circle System Utilized and Circle system utilized Preoxygenation: Pre-oxygenation with 100% oxygen Intubation Type: IV induction Ventilation: Mask ventilation without difficulty Laryngoscope Size: Mac and 3 Grade View: Grade III Tube type: Oral Tube size: 8.0 mm Number of attempts: 1 Airway Equipment and Method: Stylet Placement Confirmation: ETT inserted through vocal cords under direct vision,  positive ETCO2 and breath sounds checked- equal and bilateral Secured at: 22 cm Tube secured with: Tape Dental Injury: Teeth and Oropharynx as per pre-operative assessment

## 2015-06-25 NOTE — Anesthesia Postprocedure Evaluation (Signed)
Anesthesia Post Note  Patient: Bradley Hunt  Procedure(s) Performed: Procedure(s) (LRB): Thoracic six LAMINECTOMY RESECTION FOR TUMOR (N/A)  Patient location during evaluation: PACU Anesthesia Type: General Level of consciousness: awake, awake and alert and oriented Pain management: pain level controlled Vital Signs Assessment: post-procedure vital signs reviewed and stable Respiratory status: spontaneous breathing, respiratory function stable and nonlabored ventilation Cardiovascular status: blood pressure returned to baseline Postop Assessment: no headache Anesthetic complications: no    Last Vitals:  Filed Vitals:   06/25/15 1515 06/25/15 1537  BP:  120/71  Pulse: 69 66  Temp:  36.8 C  Resp: 10 12    Last Pain:  Filed Vitals:   06/25/15 1609  PainSc: 8                  Rakesha Dalporto COKER

## 2015-06-25 NOTE — Progress Notes (Addendum)
Patient ID: Bradley Hunt, male   DOB: [birth year], 45 y.o.   MRN: [id number]  PROGRESS NOTE    Bradley Hunt  [id number] DOB: 1970-11-05 DOA: 06/23/2015  PCP: Barbette Merino, MD   Brief Narrative:  45 y.o. male with no signficant past medical history who presented to [facility] with back pain, ataxia and leg numbness for past 1 month prior to this admission. Patient has been seen by primary care physician who prescribed NSAIDs and exercise but the pain has gotten progressively worse to the point where patient could not ambulate normally and it interfered with his sleep. Patient reported numbness in the trunk area, abdomen and has had fecal incontinence. His primary care physician subsequently prescribed Decadron and referred him to ER for evaluation.  Findings on MRI suggestive of osseous metastatic disease with pathologic fracture in T6. Neurosurgery has seen the pt in consultation.   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 - SPEP, UPEP - pending  - PSA WNL - Based on MRI multiple metastatic lesions in the bones with pathologic fracture in T6 area  - Seen by neurosurgery and due to significant cord compression Decadron started 06/24/2015 4 mg IV every 6 hours - Neurosurgery plans T6 laminectomy, bilateral transpediuclar approach for decompression. Biopsy can be obtained that way. - Appreciate their recommendations - Continue pain management efforts - Physical therapy once patient able to participate    DVT prophylaxis: Lovenox subcutaneous Code Status: full code  Family Communication: Family not at the bedside Disposition Plan: Home likely by 5/30 depending on if we have a diagnosis and if pain is better and if pt can ambulate     Consultants:   Neurosurgery   Procedures:   None   Antimicrobials:   None    Subjective: Says pain is little better this am.   Objective: Filed Vitals:   06/24/15 1050 06/24/15 1521 06/24/15 2142 06/25/15 0610  BP:  140/88 127/68 114/80  Pulse:  58 71 58  Temp:  98 F (36.7 C) 98.4 F (36.9 C) 98.1 F (36.7 C)  TempSrc:   Oral Oral  Resp:  18 16 16   Height: 6' 3"  (1.905 m)     Weight: 100.245 kg (221 lb)     SpO2:  100% 100% 100%    Intake/Output Summary (Last 24 hours) at 06/25/15 0756 Last data filed at 06/25/15 0600  Gross per 24 hour  Intake    480 ml  Output    400 ml  Net     80 ml   Filed Weights   06/24/15 1050  Weight: 100.245 kg (221 lb)    Examination:  General exam: Appears in no acute distress  Respiratory system: Respiratory effort normal. No wheezing Cardiovascular system: S1 & S2 appreciated, Rate controlled. No JVD Gastrointestinal system: (+) BS, non tender abdomen  Central nervous system: numbness in the trunk area, abdomen but per pt better Extremities: No edema, palpable pulses. Skin: warm and dry skin  Psychiatry: Mood & affect appropriate.   Data Reviewed: I have personally reviewed following labs and imaging studies  CBC:  Recent Labs Lab 06/24/15 0437  WBC 12.6*  NEUTROABS 9.0*  HGB 10.6*  HCT 31.3*  MCV 90.2  PLT 035   Basic Metabolic Panel:  Recent Labs Lab 06/24/15  0437  NA 140  K 4.1  CL 109  CO2 23  GLUCOSE 99  BUN 13  CREATININE 0.96  CALCIUM 9.9   GFR: Estimated Creatinine Clearance: 116.1 mL/min (by C-G formula based on Cr of 0.96). Liver Function Tests:  Recent Labs Lab 06/24/15 0437  AST 24  ALT 18  ALKPHOS 40  BILITOT 1.0  PROT 6.7  ALBUMIN 4.2   No results for input(s): LIPASE, AMYLASE in the last 168 hours. No results for input(s): AMMONIA in the last 168 hours. Coagulation Profile:  Recent Labs Lab 06/24/15 1541  INR 1.14   Cardiac Enzymes: No results for input(s):  CKTOTAL, CKMB, CKMBINDEX, TROPONINI in the last 168 hours. BNP (last 3 results) No results for input(s): PROBNP in the last 8760 hours. HbA1C: No results for input(s): HGBA1C in the last 72 hours. CBG: No results for input(s): GLUCAP in the last 168 hours. Lipid Profile: No results for input(s): CHOL, HDL, LDLCALC, TRIG, CHOLHDL, LDLDIRECT in the last 72 hours. Thyroid Function Tests: No results for input(s): TSH, T4TOTAL, FREET4, T3FREE, THYROIDAB in the last 72 hours. Anemia Panel:  Recent Labs  06/24/15 1159  FERRITIN 307  TIBC 256  IRON 116  RETICCTPCT 1.2   Urine analysis: No results found for: COLORURINE, APPEARANCEUR, LABSPEC, PHURINE, GLUCOSEU, HGBUR, BILIRUBINUR, KETONESUR, PROTEINUR, UROBILINOGEN, NITRITE, LEUKOCYTESUR Sepsis Labs: @LABRCNTIP (procalcitonin:4,lacticidven:4)  Recent Results (from the past 240 hour(s))  Surgical pcr screen     Status: Abnormal   Collection Time: 06/24/15 11:53 PM  Result Value Ref Range Status   MRSA, PCR NEGATIVE NEGATIVE Final   Staphylococcus aureus POSITIVE (A) NEGATIVE Final    Comment:        The Xpert SA Assay (FDA approved for NASAL specimens in patients over 59 years of age), is one component of a comprehensive surveillance program.  Test performance has been validated by [facility] for patients greater than or equal to 39 year old. It is not intended to diagnose infection nor to guide or monitor treatment.       Radiology Studies: Mr Thoracic Spine W Wo Contrast 06/24/2015  ADDENDUM REPORT: 06/24/2015 07:56 ADDENDUM: Upon further review, there is a probable additional discrete lesion within the right posterior aspect of the T1 vertebral body measuring approximately 15 mm (series 3, image 7). Additional lesion within the right superior aspect of the T11 vertebral body measures approximately 17 mm (series 3, image 7). Probable tiny 7 mm lesion within the posterior aspect of T8 (series 3, image 8). No associated  pathologic fracture or extra osseous extension of tumor with these additional lesions. In fact, heterogeneous marrow signal intensity seen on axial merge sequences is highly concerning for diffuse osseous metastases even though these are not discretely delineated on STIR and post-contrast sequences. Probable involvement of the bilateral ribs noted. Benign hemangioma and with mild chronic height loss noted at T4.   06/24/2015  1. Abnormal appearance of the T6 vertebral body, highly suspicious for possible osseous metastasis. Associated pathologic fracture with up to 30% height loss. There is associated abnormal soft tissue density within the ventral epidural space, concerning for epidural tumor. Associated fairly severe canal stenosis. Question mild edema within the thoracic spinal cord related to compression at this level. 2. Question additional lesion involving the T9 vertebral body as above. No other definite focal lesions within the thoracic spine. Electronically Signed: By: Jeannine Boga M.D. On: 06/24/2015 03:41   Mr Lumbar Spine W Wo Contrast 06/24/2015  1. Focal osseous lesion with  abnormal enhancement involving the right pedicle of L3, suspicious for possible osseous metastasis given the findings in the thoracic spine. Question additional focal lesion within the right iliac wing as above. No pathologic fracture. 2. Right subarticular disc protrusion at L4-5, impinging upon the right L5 nerve root in the right lateral recess. 3. Shallow central disc protrusion at L5-S1 without stenosis or neural impingement.     Scheduled Meds: . Chlorhexidine Gluconate Cloth  6 each Topical Daily  . dexamethasone  4 mg Intravenous Q6H  . enoxaparin (LOVENOX) injection  40 mg Subcutaneous Q24H  . mupirocin ointment  1 application Nasal BID   Continuous Infusions:    LOS: 1 day   Time spent: 15 minutes  Greater than 50% of the time spent on counseling and coordinating the care.   Leisa Lenz,  MD [facility] Pager [phone]  If 7PM-7AM, please contact night-coverage [email or web address] Password [email or web address] 06/25/2015, 7:56 AM

## 2015-06-25 NOTE — Progress Notes (Signed)
No issues overnight. Pt has no new c/o.  EXAM:  BP 130/86 mmHg  Pulse 77  Temp(Src) 98.1 F (36.7 C) (Oral)  Resp 16  Ht 6\' 3"  (1.905 m)  Wt 100.245 kg (221 lb)  BMI 27.62 kg/m2  SpO2 100%  Awake, alert, oriented  Speech fluent, appropriate  CN grossly intact  5/5 BUE/BLE   IMPRESSION:  45 y.o. male with osseous lesions, likely mets with no known primary. Has significant spinal cord compression with myelopathy, no symptoms of instability to necessitate instrumented stabilization.  PLAN: - Proceed with T6 laminectomy, resection of tumor for decompression/diagnosis  I have reviewed the procedure in detail with the patient. Risks and benefits were reviewed. All questions were answered.

## 2015-06-25 NOTE — Op Note (Signed)
PREOP DIAGNOSIS:  1. Thoracic myelopathy 2. T6 tumor with spinal stenosis  POSTOP DIAGNOSIS: Same  PROCEDURE: 1. T6 laminectomy, bilateral transpedicular approach for resection of tumor, decompression of thecal sac 2. Use of microscope for microdissection  SURGEON: Dr. Consuella Lose, MD  ASSISTANT: Dr. Jovita Gamma, MD  ANESTHESIA: General Endotracheal  EBL: 1200cc  SPECIMENS: T6 tumor for permanent pathology  DRAINS: None  COMPLICATIONS: none immediate  CONDITION: Stable to PCAU  HISTORY: Bradley Hunt is a 45 y.o. male who initially presented to the emergency department after outpatient MRI demonstrated multiple bony lesions including one at T6 with spinal cord compression. He has been having difficulty walking for several weeks with a sensory level at approximately T6. With these findings, surgical decompression and biopsy was indicated. Risks and benefits were discussed in detail. After all questions were answered, consent was obtained.  PROCEDURE IN DETAIL: After informed consent was obtained and witnessed, the patient was brought to the operating room. After induction of general anesthesia, the patient was positioned on the operative table in the prone position with all pressure points meticulously padded. The skin of the low back was then prepped and draped in the usual sterile fashion.  Under fluoroscopy, the correct levels were identified and marked out on the skin, and after timeout was conducted, the skin was infiltrated with local anesthetic. Skin incision was then made sharply and Bovie electrocautery was used to dissect the subcutaneous tissue until the thoracodorsal fascia was identified. The fascia was then incised using Bovie electrocautery and the lamina at the T5, T6, and T7 levels was identified and dissection was carried out in the subperiosteal plane. Self-retaining retractor was then placed, and intraoperative fluoro was used to confirm we were at the  correct levels.  Using a high-speed drill, laminectomy at T6 was completed. The ligamentum flavum was then identified and removed. Combination of drill and rongeurs was used to remove the T6 inferior articulating process bilaterally. The pedicles at T6 were then drilled down flush with the vertebral body. The right > left pedicle was noted to be infiltrated with soft, tan-purple tumor which was collected and sent for permanent pathology.  After removal of the pedicles, the ventral epidural space was decompressed using a combination of ball tip dissectors and downgoing currettes. Good decompression in the ventral space was confirmed with a long ball-tip dissector.  Hemostasis was then secured using a combination of morcellized Gelfoam and thrombin and bipolar electrocautery. The wound is irrigated with copious amounts of antibiotic saline irrigation.Self-retaining retractor was then removed, and the wound is closed in layers using a combination of interrupted 0 Vicryl and 3-0 Vicryl stitches. The skin was closed using standard skin glue.  At the end of the case all sponge, needle, cottonoid, and instrument counts were correct. The patient was then transferred to the stretcher and taken to the postanesthesia care unit in stable hemodynamic condition.

## 2015-06-26 ENCOUNTER — Encounter (HOSPITAL_COMMUNITY): Payer: Self-pay | Admitting: Neurosurgery

## 2015-06-26 MED ORDER — HYDROCODONE-ACETAMINOPHEN 5-325 MG PO TABS
1.0000 | ORAL_TABLET | ORAL | Status: DC | PRN
  Administered 2015-06-26: 2 via ORAL
  Administered 2015-06-26 – 2015-06-27 (×5): 1 via ORAL
  Administered 2015-06-28: 2 via ORAL
  Filled 2015-06-26: qty 2
  Filled 2015-06-26 (×2): qty 1
  Filled 2015-06-26: qty 2
  Filled 2015-06-26: qty 1
  Filled 2015-06-26: qty 2
  Filled 2015-06-26: qty 1

## 2015-06-26 NOTE — Progress Notes (Signed)
Filed Vitals:   06/25/15 1537 06/25/15 2114 06/26/15 0018 06/26/15 0520  BP: 120/71 101/52 118/60 107/59  Pulse: 66 69 64 64  Temp: 98.2 F (36.8 C) 98.9 F (37.2 C) 98 F (36.7 C) 98.6 F (37 C)  TempSrc: Oral Oral  Oral  Resp: 12 16 18 16   Height:      Weight:      SpO2: 99% 100% 100% 100%    CBC  Recent Labs  06/24/15 0437 06/25/15 1333  WBC 12.6*  --   HGB 10.6* 8.5*  HCT 31.3* 25.0*  PLT 215  --    BMET  Recent Labs  06/24/15 0437 06/25/15 1333  NA 140 142  K 4.1 4.2  CL 109  --   CO2 23  --   GLUCOSE 99 123*  BUN 13  --   CREATININE 0.96  --   CALCIUM 9.9  --     Patient with pain in the midthoracic region, similar to prior to surgery, but without other complaints. Strength in lower extremities is good. Patient's nurse reports that the dressing is clean and dry. Pathology pending. To be seen by PT and OT.  Plan: Continue to progress through postoperative recovery. Awaiting pathology which will guide recommendation for further treatment.  Hosie Spangle, MD 06/26/2015, 10:48 AM

## 2015-06-26 NOTE — Evaluation (Signed)
Occupational Therapy Evaluation Patient Details Name: Treyvaughn Deruyter MRN: [id number] DOB: 06-Apr-1970 Today's Date: 06/26/2015    History of Present Illness Harwell Torian is a 45 y.o. male who initially presented to the emergency department after outpatient MRI demonstrated multiple bony lesions including one at T6 with spinal cord compression. He has been having difficulty walking for several weeks with a sensory level at approximately T6. With these findings, surgical decompression and biopsy was indicated.    Clinical Impression   This 45 yo male admitted and underwent above presents to acute OT with deficits below (see OT problem list) thus affecting his PLOF of independent with basic and IADLs. He will benefit from acute OT without need for follow up.    Follow Up Recommendations  No OT follow up    Equipment Recommendations  3 in 1 bedside comode;Other (comment) (RW--tall)       Precautions / Restrictions Precautions Precautions: Fall;Back Precaution Booklet Issued: Yes (comment) Precaution Comments: reviewed precautions as guidelines Restrictions Weight Bearing Restrictions: No      Mobility Bed Mobility Overal bed mobility: Needs Assistance Bed Mobility: Rolling;Sidelying to Sit Rolling: Min assist Sidelying to sit: Min guard       General bed mobility comments: VCs for sequencing and technique  Transfers Overall transfer level: Needs assistance Equipment used: Rolling walker (2 wheeled) Transfers: Sit to/from Stand Sit to Stand: Min guard              Balance Overall balance assessment: Needs assistance Sitting-balance support: Feet supported;No upper extremity supported Sitting balance-Leahy Scale: Good     Standing balance support: Bilateral upper extremity supported Standing balance-Leahy Scale: Poor Standing balance comment: reliant on RW at this time                            ADL Overall ADL's : Needs  assistance/impaired Eating/Feeding: Independent;Sitting   Grooming: Set up;Sitting   Upper Body Bathing: Set up;Sitting   Lower Body Bathing: Minimal assistance (min guard sit<>stand)   Upper Body Dressing : Set up;Sitting   Lower Body Dressing: Moderate assistance (min guard sit<>stand)   Toilet Transfer: Min guard;Ambulation;RW (bed>out door and down hall>sit in recliner behind him)   Toileting- Water quality scientist and Hygiene: Min guard;Sit to/from stand                         Pertinent Vitals/Pain Pain Assessment: 0-10 Pain Score: 8  Pain Location: mid to upper back Pain Descriptors / Indicators: Tightness Pain Intervention(s): Monitored during session;Repositioned     Hand Dominance Right   Extremity/Trunk Assessment Upper Extremity Assessment Upper Extremity Assessment: Overall WFL for tasks assessed           Communication Communication Communication: No difficulties;Prefers language other than [place] (from [place] but [place] good, speak slowly, basic language)   Cognition Arousal/Alertness: Awake/alert Behavior During Therapy: WFL for tasks assessed/performed Overall Cognitive Status: Within Functional Limits for tasks assessed                                Home Living Family/patient expects to be discharged to:: Private residence Living Arrangements: Spouse/significant other;Children Available Help at Discharge: Family;Available 24 hours/day Type of Home: Apartment Home Access: Level entry     Home Layout: One level     Bathroom Shower/Tub: Teacher, early years/pre: Standard     Home  Equipment: None          Prior Functioning/Environment Level of Independence: Independent        Comments: truck driver    OT Diagnosis: Generalized weakness;Acute pain   OT Problem List: Decreased range of motion;Impaired balance (sitting and/or standing);Decreased knowledge of use of DME or AE;Decreased knowledge of  precautions;Pain   OT Treatment/Interventions: Self-care/ADL training;Patient/family education;Balance training;DME and/or AE instruction    OT Goals(Current goals can be found in the care plan section) Acute Rehab OT Goals Patient Stated Goal: to work on walking OT Goal Formulation: With patient Time For Goal Achievement: 07/03/15 Potential to Achieve Goals: Good  OT Frequency: Min 2X/week           Co-evaluation PT/OT/SLP Co-Evaluation/Treatment: Yes Reason for Co-Treatment: For patient/therapist safety PT goals addressed during session: Mobility/safety with mobility;Balance;Proper use of DME OT goals addressed during session: ADL's and self-care;Strengthening/ROM      End of Session Equipment Utilized During Treatment: Gait belt;Rolling walker Nurse Communication:  (NT mobility staus; pt requests pain meds and nurse in to give during session)  Activity Tolerance: Patient tolerated treatment well Patient left: in chair;with call bell/phone within reach;with family/visitor present   Time: 1020-1055 OT Time Calculation (min): 35 min Charges:  OT General Charges $OT Visit: 1 Procedure OT Evaluation $OT Eval Moderate Complexity: 1 Procedure  Almon Register [id number] 06/26/2015, 11:06 AM

## 2015-06-26 NOTE — Evaluation (Signed)
Physical Therapy Evaluation Patient Details Name: Bradley Hunt MRN: [id number] DOB: July 13, 1970 Today's Date: 06/26/2015   History of Present Illness  Bradley Hunt is a 45 y.o. male who initially presented to the emergency department after outpatient MRI demonstrated multiple bony lesions including one at T6 with spinal cord compression. He has been having difficulty walking for several weeks with a sensory level at approximately T6. With these findings, surgical decompression and biopsy was indicated.   Clinical Impression  Pt admitted with above diagnosis. Pt currently with functional limitations due to the deficits listed below (see PT Problem List). Pt ambulated 100' with RW and min A. Pt will benefit from skilled PT to increase their independence and safety with mobility to allow discharge to the venue listed below.       Follow Up Recommendations No PT follow up    Equipment Recommendations  Rolling walker with 5" wheels    Recommendations for Other Services       Precautions / Restrictions Precautions Precautions: Fall;Back Precaution Booklet Issued: Yes (comment) Precaution Comments: reviewed precautions as guidelines Restrictions Weight Bearing Restrictions: No      Mobility  Bed Mobility Overal bed mobility: Needs Assistance Bed Mobility: Rolling;Sidelying to Sit Rolling: Min assist Sidelying to sit: Min guard       General bed mobility comments: VCs for sequencing and technique, increased time needed and use of rail  Transfers Overall transfer level: Needs assistance Equipment used: Rolling walker (2 wheeled) Transfers: Sit to/from Stand Sit to Stand: Min guard         General transfer comment: vc's for hand placement and min-guard for safety. Pt dizzy with position changes but improved within 2 mins of sitting then standing.   Ambulation/Gait Ambulation/Gait assistance: Min assist Ambulation Distance (Feet): 100 Feet Assistive device: Rolling walker  (2 wheeled) Gait Pattern/deviations: Step-through pattern Gait velocity: decreased, guarded Gait velocity interpretation: Below normal speed for age/gender General Gait Details: pt did not have ataxic pattern that he had been having before surgery. RW helpful for stability and pain control, but gait improved with increased distance  Stairs            Wheelchair Mobility    Modified Rankin (Stroke Patients Only)       Balance Overall balance assessment: Needs assistance Sitting-balance support: Feet supported Sitting balance-Leahy Scale: Good     Standing balance support: Bilateral upper extremity supported Standing balance-Leahy Scale: Poor Standing balance comment: currently reliant on RW                             Pertinent Vitals/Pain Pain Assessment: 0-10 Pain Score: 8  Pain Location: mid to upper back and tightness around chest Pain Descriptors / Indicators: Tightness Pain Intervention(s): RN gave pain meds during session;Limited activity within patient's tolerance;Monitored during session    Home Living Family/patient expects to be discharged to:: Private residence Living Arrangements: Spouse/significant other;Children Available Help at Discharge: Family;Available 24 hours/day Type of Home: Apartment Home Access: Level entry     Home Layout: One level Home Equipment: None      Prior Function Level of Independence: Independent         Comments: truck driver     Hand Dominance   Dominant Hand: Right    Extremity/Trunk Assessment   Upper Extremity Assessment: Defer to OT evaluation           Lower Extremity Assessment: RLE deficits/detail;LLE deficits/detail RLE Deficits / Details: pt  has tightness RLE > LLE (difficulty crossing right ankle over left knee) but strength equal bilaterally and only slightly decreased from his normal 4+/5 throughout. Pt reports sensory changes bottom of bilateral feet LLE Deficits / Details: see  REL note  Cervical / Trunk Assessment: Normal  Communication   Communication: No difficulties;Prefers language other than English  Cognition Arousal/Alertness: Awake/alert Behavior During Therapy: WFL for tasks assessed/performed Overall Cognitive Status: Within Functional Limits for tasks assessed                      General Comments General comments (skin integrity, edema, etc.): discussed proper seating and activity level upon return home    Exercises General Exercises - Lower Extremity Ankle Circles/Pumps: AROM;Both;15 reps;Seated      Assessment/Plan    PT Assessment Patient needs continued PT services  PT Diagnosis Difficulty walking;Acute pain;Abnormality of gait   PT Problem List Decreased strength;Decreased activity tolerance;Decreased mobility;Decreased balance;Decreased knowledge of use of DME;Decreased knowledge of precautions;Pain;Impaired sensation  PT Treatment Interventions DME instruction;Gait training;Functional mobility training;Therapeutic activities;Therapeutic exercise;Balance training;Patient/family education   PT Goals (Current goals can be found in the Care Plan section) Acute Rehab PT Goals Patient Stated Goal: to work on walking PT Goal Formulation: With patient Time For Goal Achievement: 07/03/15 Potential to Achieve Goals: Good    Frequency Min 5X/week   Barriers to discharge        Co-evaluation PT/OT/SLP Co-Evaluation/Treatment: Yes Reason for Co-Treatment: For patient/therapist safety PT goals addressed during session: Mobility/safety with mobility;Balance;Proper use of DME OT goals addressed during session: ADL's and self-care;Strengthening/ROM       End of Session Equipment Utilized During Treatment: Gait belt Activity Tolerance: Patient tolerated treatment well Patient left: in chair;with family/visitor present;with call bell/phone within reach Nurse Communication: Mobility status;Other (comment) (ambulate again today)          Time: [id number] PT Time Calculation (min) (ACUTE ONLY): 35 min   Charges:   PT Evaluation $PT Eval Moderate Complexity: 1 Procedure     PT G Codes:       Leighton Roach, PT  Acute Rehab Services  [place], [place] 06/26/2015, 11:17 AM

## 2015-06-26 NOTE — Progress Notes (Signed)
Patient ID: Jayan Raymundo, male   DOB: August 20, 1970, 45 y.o.   MRN: [id number]  PROGRESS NOTE    Braylan Faul  YME:[id number] DOB: 09-Nov-1970 DOA: 06/23/2015  PCP: Barbette Merino, MD   Brief Narrative:  45 y.o. male with no signficant past medical history who presented to [facility] with back pain, ataxia and leg numbness for past 1 month prior to this admission. Patient has been seen by primary care physician who prescribed NSAIDs and exercise but the pain has gotten progressively worse to the point where patient could not ambulate normally and it interfered with his sleep. Patient reported numbness in the trunk area, abdomen and has had fecal incontinence. His primary care physician subsequently prescribed Decadron and referred him to ER for evaluation.  Findings on MRI suggestive of osseous metastatic disease with pathologic fracture in T6. Neurosurgery has seen the pt in consultation. Pt is s/p T6 laminectomy, bilateral transpedicular approach for resection of tumor, decompression of thecal sac on 06/25/2015.  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 Kathyrn Sheriff   Procedures:  T6 laminectomy, bilateral transpedicular approach for resection of tumor, decompression of thecal sac - 06/25/2015  Antimicrobials:   None      Subjective: Pain is bearable but says pain med dont last long enough.  Objective: Filed Vitals:   06/25/15 1537 06/25/15 2114 06/26/15 0018 06/26/15 0520  BP: 120/71 101/52 118/60 107/59  Pulse: 66 69 64 64  Temp: 98.2 F (36.8 C) 98.9 F (37.2 C) 98 F (36.7 C) 98.6 F (37 C)  TempSrc: Oral Oral  Oral  Resp: 12 16 18 16   Height:      Weight:      SpO2: 99% 100% 100% 100%    Intake/Output Summary (Last 24 hours) at 06/26/15 1015 Last data filed at 06/26/15 0900  Gross per 24 hour  Intake   3135 ml  Output   3275 ml  Net   -140 ml   Filed Weights   06/24/15 1050  Weight: 100.245 kg (221 lb)    Examination:  General exam: Calm, no distress   Respiratory system: Respiratory effort normal, no wheezing . Cardiovascular system: S1 & S2 (+), Rate controlled.  Gastrointestinal system: (+) BS, non tender  Central nervous system: numbness  along truncal and abdominal area  Extremities: no edema, palpable pulses  Skin: warm, dry Psychiatry: Normal mood and behavior   Data Reviewed: I have personally reviewed following labs and imaging studies  CBC:  Recent Labs Lab 06/24/15 0437 06/25/15 1333  WBC 12.6*  --   NEUTROABS 9.0*  --   HGB 10.6* 8.5*  HCT 31.3* 25.0*  MCV 90.2  --   PLT 215  --    Basic Metabolic Panel:  Recent Labs Lab 06/24/15 0437 06/25/15 1333  NA 140 142  K 4.1 4.2  CL 109  --   CO2  23  --   GLUCOSE 99 123*  BUN 13  --   CREATININE 0.96  --   CALCIUM 9.9  --    GFR: Estimated Creatinine Clearance: 116.1 mL/min (by C-G formula based on Cr of 0.96). Liver Function Tests:  Recent Labs Lab 06/24/15 0437  AST 24  ALT 18  ALKPHOS 40  BILITOT 1.0  PROT 6.7  ALBUMIN 4.2   No results for input(s): LIPASE, AMYLASE in the last 168 hours. No results for input(s): AMMONIA in the last 168 hours. Coagulation Profile:  Recent Labs Lab 06/24/15 1541  INR 1.14   Cardiac Enzymes: No results for input(s): CKTOTAL, CKMB,  CKMBINDEX, TROPONINI in the last 168 hours. BNP (last 3 results) No results for input(s): PROBNP in the last 8760 hours. HbA1C: No results for input(s): HGBA1C in the last 72 hours. CBG: No results for input(s): GLUCAP in the last 168 hours. Lipid Profile: No results for input(s): CHOL, HDL, LDLCALC, TRIG, CHOLHDL, LDLDIRECT in the last 72 hours. Thyroid Function Tests: No results for input(s): TSH, T4TOTAL, FREET4, T3FREE, THYROIDAB in the last 72 hours. Anemia Panel:  Recent Labs  06/24/15 1159  FERRITIN 307  TIBC 256  IRON 116  RETICCTPCT 1.2   Urine analysis: No results found for: COLORURINE, APPEARANCEUR, LABSPEC, PHURINE, GLUCOSEU, HGBUR, BILIRUBINUR, KETONESUR, PROTEINUR, UROBILINOGEN, NITRITE, LEUKOCYTESUR Sepsis Labs: @LABRCNTIP (procalcitonin:4,lacticidven:4)   Recent Results (from the past 240 hour(s))  Surgical pcr screen     Status: Abnormal   Collection Time: 06/24/15 11:53 PM  Result Value Ref Range Status   MRSA, PCR NEGATIVE NEGATIVE Final   Staphylococcus aureus POSITIVE (A) NEGATIVE Final      Radiology Studies: Ct Chest W Contrast 06/24/2015  1. Multiple lytic lesions involving the thoracolumbar spine and pelvis as can be seen with multiple myeloma versus metastatic disease. Destructive bone lesion in the T6 vertebral body with impression on the thecal sac and approximately 15% height loss. Electronically Signed   By: Kathreen Devoid   On: 06/24/2015 13:12   Mr Thoracic Spine W Wo Contrast 06/24/2015  ADDENDUM REPORT: 06/24/2015 07:56 ADDENDUM: Upon further review, there is a probable additional discrete lesion within the right posterior aspect of the T1 vertebral body measuring approximately 15 mm (series 3, image 7). Additional lesion within the right superior aspect of the T11 vertebral body measures approximately 17 mm (series 3, image 7). Probable tiny 7 mm lesion within the posterior aspect of T8 (series 3, image 8). No associated pathologic fracture or  extra osseous extension of tumor with these additional lesions. In fact, heterogeneous marrow signal intensity seen on axial merge sequences is highly concerning for diffuse osseous metastases even though these are not discretely delineated on STIR and post-contrast sequences. Probable involvement of the bilateral ribs noted. Benign hemangioma and with mild chronic height loss noted at T4. Electronically Signed   By: Jeannine Boga M.D.   On: 06/24/2015 07:56 06/24/2015  1. Abnormal appearance of the T6 vertebral body, highly suspicious for possible osseous metastasis. Associated pathologic fracture with up to 30% height loss. There is associated abnormal soft tissue density within the ventral epidural space, concerning for epidural tumor. Associated fairly severe canal stenosis. Question mild edema within the thoracic spinal cord related to compression at this level. 2. Question additional lesion involving the T9 vertebral body as above. No other definite focal lesions within the thoracic spine. Electronically Signed: By: Jeannine Boga M.D. On: 06/24/2015 03:41   Mr Lumbar Spine W Wo Contrast 06/24/2015  1. Focal osseous lesion with abnormal enhancement involving the right pedicle of L3, suspicious for possible osseous metastasis given the findings in the thoracic spine. Question additional focal lesion within the right iliac wing as above. No pathologic fracture. 2. Right subarticular disc protrusion at L4-5, impinging upon the right L5 nerve root in the right lateral recess. 3. Shallow central disc protrusion at L5-S1 without stenosis or neural impingement. Electronically Signed   By: Jeannine Boga M.D.   On: 06/24/2015 03:53   Ct Abdomen Pelvis W Contrast 06/24/2015  1. Multiple lytic lesions involving the thoracolumbar spine and pelvis as can be seen with multiple myeloma versus metastatic disease. Destructive bone lesion in the T6 vertebral body with impression on the thecal sac and  approximately 15% height loss. Electronically Signed   By: Kathreen Devoid   On: 06/24/2015 13:12   Dg C-arm 1-60 Min-no Report 06/25/2015  CLINICAL DATA: T6 Laminectomy C-ARM 1-60 MINUTES Fluoroscopy was utilized by the requesting physician.  No radiographic interpretation.      Scheduled Meds: . Chlorhexidine Gluconate Cloth  6 each Topical Daily  . dexamethasone  4 mg Intravenous Q6H  . enoxaparin (LOVENOX) injection  40 mg Subcutaneous Q24H  . mupirocin ointment  1 application Nasal BID   Continuous Infusions:    LOS: 2 days    Time spent: 15 minutes  Greater than 50% of the time spent on counseling and coordinating the care.   Leisa Lenz, MD [facility] Pager [phone]  If 7PM-7AM, please contact night-coverage [email or web address] Password [email or web address] 06/26/2015, 10:15 AM

## 2015-06-27 LAB — PROTEIN ELECTROPHORESIS, SERUM
A/G Ratio: 1.6 (ref 0.7–1.7)
ALBUMIN ELP: 3.8 g/dL (ref 2.9–4.4)
ALPHA-1-GLOBULIN: 0.2 g/dL (ref 0.0–0.4)
Alpha-2-Globulin: 0.7 g/dL (ref 0.4–1.0)
BETA GLOBULIN: 0.8 g/dL (ref 0.7–1.3)
GAMMA GLOBULIN: 0.6 g/dL (ref 0.4–1.8)
Globulin, Total: 2.4 g/dL (ref 2.2–3.9)
M-Spike, %: 0.2 g/dL — ABNORMAL HIGH
Total Protein ELP: 6.2 g/dL (ref 6.0–8.5)

## 2015-06-27 NOTE — Progress Notes (Signed)
Occupational Therapy Treatment and Discharge Patient Details Name: Bradley Hunt MRN: [id number] DOB: 11/15/1970 Today's Date: 06/27/2015    History of present illness Bradley Hunt is a 45 y.o. male who initially presented to the emergency department after outpatient MRI demonstrated multiple bony lesions including one at T6 with spinal cord compression. He has been having difficulty walking for several weeks with a sensory level at approximately T6. With these findings, surgical decompression and biopsy was indicated.    OT comments  This 44 yo male admitted and underwent above presents to acute OT with all education completed with pt and wife. Wife will A him prn for basic ADLs. No further OT needed, we will sign off.  Follow Up Recommendations  No OT follow up    Equipment Recommendations  3 in 1 bedside comode;Other (comment) (tall RW)       Precautions / Restrictions Precautions Precautions: Fall;Back Precaution Comments: reviewed precautions as guidelines Restrictions Weight Bearing Restrictions: No       Mobility Bed Mobility Overal bed mobility: Needs Assistance Bed Mobility: Rolling;Sidelying to Sit Rolling: Supervision Sidelying to sit: Supervision       General bed mobility comments: VCs for technique without use of rail and HOB flat  Transfers Overall transfer level: Needs assistance Equipment used: Rolling walker (2 wheeled) Transfers: Sit to/from Stand Sit to Stand: Supervision         General transfer comment: Pt ambulated entire unit with RW and S        ADL Overall ADL's : Needs assistance/impaired       Grooming Details (indicate cue type and reason): educated pt on safer way to brush teeth than trying to bend over the sink due to this may irritate his sx site--use a cup to spit into             Lower Body Dressing: Minimal assistance (needs A to don socks (was not interested in sock aid--wife will A) was interested in reacher for  doffing socks and picking items up off of floor)   Toilet Transfer: Supervision/safety;Ambulation;BSC (over toilet)   Toileting- Clothing Manipulation and Hygiene: Supervision/safety;Sit to/from stand   Tub/ Shower Transfer: Supervision/safety;Ambulation;Rolling walker;3 in 1                      Cognition   Behavior During Therapy: WFL for tasks assessed/performed Overall Cognitive Status: Within Functional Limits for tasks assessed                                    Pertinent Vitals/ Pain       Pain Assessment: 0-10 Pain Score: 7  ("but not that bad") Pain Location: mid to upper back tightness around chest and feet to half way up calves Pain Descriptors / Indicators: Tightness Pain Intervention(s): Monitored during session;Repositioned         Frequency Min 2X/week     Progress Toward Goals  OT Goals(current goals can now be found in the care plan section)  Progress towards OT goals: Goals met/education completed, patient discharged from [facility] Discharge plan remains appropriate       End of Session Equipment Utilized During Treatment: Rolling walker   Activity Tolerance Patient tolerated treatment well   Patient Left in chair;with call bell/phone within reach;with family/visitor present           Time: [id number] OT Time Calculation (min):  41 min  Charges: OT General Charges $OT Visit: 1 Procedure OT Treatments $Self Care/Home Management : 38-52 mins  Almon Register [phone] 06/27/2015, 12:22 PM

## 2015-06-27 NOTE — Progress Notes (Signed)
No issues overnight. Pt reports improvement in LE "tightness" Still has some numbness around his ches/abd. Walking better.  EXAM:  BP 142/89 mmHg  Pulse 72  Temp(Src) 98 F (36.7 C) (Oral)  Resp 16  Ht 6\' 3"  (1.905 m)  Wt 100.245 kg (221 lb)  BMI 27.62 kg/m2  SpO2 100%  Awake, alert, oriented  Speech fluent, appropriate  CN grossly intact  5/5 BUE/BLE   IMPRESSION:  45 y.o. male s/p thoracic lami for decompression/biopsy of tumor, doing well  PLAN: - Cont PT/OT - Await biopsy results for further treatment

## 2015-06-27 NOTE — Progress Notes (Addendum)
Patient ID: Cortland Crehan, male   DOB: [birth year], 45 y.o.   MRN: [id number]  PROGRESS NOTE    Lytle Malburg  [id number] DOB: August 20, 1970 DOA: 06/23/2015  PCP: Barbette Merino, MD   Brief Narrative:  45 y.o. male with no signficant past medical history who presented to [facility] with back pain, ataxia and leg numbness for past 1 month prior to this admission. Patient has been seen by primary care physician who prescribed NSAIDs and exercise but the pain has gotten progressively worse to the point where patient could not ambulate normally and it interfered with his sleep. Patient reported numbness in the trunk area, abdomen and has had fecal incontinence. His primary care physician subsequently prescribed Decadron and referred him to ER for evaluation.  Findings on MRI suggestive of osseous metastatic disease with pathologic fracture in T6. Neurosurgery has seen the pt in consultation. Pt is s/p T6 laminectomy, bilateral transpedicular approach for resection of tumor, decompression of thecal sac on 06/25/2015.  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 He still requires intermitted IV doses of morphine. - Continue decadron 4 mg IV Q 6 hours  - Physical therapy - no PT follow up required; rolling walker ordered - Also, called new pt coordinator for pt to be able to follow up in cancer center once diagnosis establish, left message about pt information  and my call back numebr    Anemia of chronic disease - Likely from malignancy - Hemoglobin 8.5 - Check CBC in am    DVT prophylaxis: Lovenox subcutaneous Code Status: full code  Family Communication: Family not at the bedside Disposition Plan: Home once pain better controlled.   Consultants:   Neurosurgery, Dr. Francena Hanly with new pt coordinator in cancer center - left message about pt information -   Procedures:   T6 laminectomy, bilateral transpedicular approach for resection of tumor, decompression of thecal sac - 06/25/2015  Antimicrobials:   None    Subjective: Pt reports being weak and having still lots of pain with trying to ambulate.   Objective: Filed Vitals:   06/26/15 0520 06/26/15 2032 06/27/15 0604 06/27/15 1300  BP: 107/59 127/65 116/74 142/89  Pulse: 64 71 66 72  Temp: 98.6 F (37 C) 98.5 F (36.9 C) 99 F (37.2 C) 98 F (36.7 C)  TempSrc: Oral Oral Oral Oral  Resp: _0 Height:      Weight:      SpO2: 100% 99% 100% 100%    Intake/Output Summary (Last 24 hours) at 06/27/15 1451 Last data filed at 06/27/15 5643  Gross per 24 hour  Intake    480 ml  Output   1300 ml  Net   -820 ml   Filed Weights   06/24/15 1050  Weight: 100.245 kg (221 lb)    Examination:  General exam: No acute distress  Respiratory system: No wheezing or rhonchi. Cardiovascular system: S1 & S2 appreciated, RRR Gastrointestinal system: (+) BS, non tender, non  distended  Central nervous system: improving numbness along truncal and abdominal area otherwise no focal findings Extremities: no LE edema, palpable pulses  Skin: no lesions or ulcers  Psychiatry: Normal mood, no agitation and no restlessness    Data Reviewed: I have personally reviewed following labs and imaging studies  CBC:  Recent Labs Lab 06/24/15 0437 06/25/15 1333  WBC 12.6*  --   NEUTROABS 9.0*  --   HGB 10.6* 8.5*  HCT 31.3* 25.0*  MCV 90.2  --   PLT 215  --    Basic  Metabolic Panel:  Recent Labs Lab 06/24/15 0437 06/25/15 1333  NA 140 142  K 4.1 4.2  CL 109  --   CO2 23  --   GLUCOSE 99 123*  BUN 13  --   CREATININE 0.96  --   CALCIUM 9.9  --    GFR: Estimated Creatinine Clearance: 116.1 mL/min (by C-G formula based on Cr of 0.96). Liver Function Tests:  Recent Labs Lab 06/24/15 0437  AST 24  ALT 18  ALKPHOS 40  BILITOT 1.0  PROT 6.7  ALBUMIN 4.2   No results for input(s): LIPASE, AMYLASE in the last 168 hours. No results for input(s): AMMONIA in the last 168 hours. Coagulation Profile:  Recent Labs Lab 06/24/15 1541  INR 1.14   Cardiac Enzymes: No results for input(s): CKTOTAL, CKMB, CKMBINDEX, TROPONINI in the last 168 hours. BNP (last 3 results) No results for input(s): PROBNP in the last 8760 hours. HbA1C: No results for input(s): HGBA1C in the last 72 hours. CBG: No results for input(s): GLUCAP in the last 168 hours. Lipid Profile: No results for input(s): CHOL, HDL, LDLCALC, TRIG, CHOLHDL, LDLDIRECT in the last 72 hours. Thyroid Function Tests: No results for input(s): TSH, T4TOTAL, FREET4, T3FREE, THYROIDAB in the last 72 hours. Anemia Panel: No results for input(s): VITAMINB12, FOLATE, FERRITIN, TIBC, IRON, RETICCTPCT in the last 72 hours. Urine analysis: No results found for: COLORURINE, APPEARANCEUR, [place], [place], GLUCOSEU, HGBUR, BILIRUBINUR, KETONESUR, PROTEINUR, UROBILINOGEN, NITRITE, LEUKOCYTESUR Sepsis Labs: _0 (procalcitonin:4,lacticidven:4)   Recent Results (from the past 240 hour(s))  Surgical pcr screen     Status: Abnormal   Collection Time: 06/24/15 11:53 PM  Result Value Ref Range Status   MRSA, PCR NEGATIVE NEGATIVE Final   Staphylococcus aureus POSITIVE (A) NEGATIVE Final      Radiology Studies: Ct Chest W Contrast 06/24/2015  1. Multiple lytic lesions involving the thoracolumbar spine and pelvis as can be seen with multiple myeloma versus metastatic disease. Destructive bone  lesion in the T6 vertebral body with impression on the thecal sac and approximately 15% height loss. Electronically Signed   By: Kathreen Devoid   On: 06/24/2015 13:12   Mr Thoracic Spine W Wo Contrast 06/24/2015  ADDENDUM REPORT: 06/24/2015 07:56 ADDENDUM: Upon further review, there is a probable additional discrete lesion within the right posterior aspect of the T1 vertebral body measuring approximately 15 mm (series 3, image 7). Additional lesion within the right superior aspect of the T11 vertebral body measures approximately 17 mm (series 3, image 7). Probable tiny 7 mm lesion within the posterior aspect of T8 (series 3, image 8). No associated pathologic fracture or extra osseous extension of tumor with these additional lesions. In fact, heterogeneous marrow signal intensity seen on axial merge sequences is highly concerning for diffuse osseous metastases even though these are not discretely delineated on STIR and post-contrast sequences. Probable involvement of the bilateral ribs noted. Benign hemangioma and with mild chronic height  loss noted at T4. Electronically Signed   By: Jeannine Boga M.D.   On: 06/24/2015 07:56 06/24/2015  1. Abnormal appearance of the T6 vertebral body, highly suspicious for possible osseous metastasis. Associated pathologic fracture with up to 30% height loss. There is associated abnormal soft tissue density within the ventral epidural space, concerning for epidural tumor. Associated fairly severe canal stenosis. Question mild edema within the thoracic spinal cord related to compression at this level. 2. Question additional lesion involving the T9 vertebral body as above. No other definite focal lesions within the thoracic spine. Electronically Signed: By: Jeannine Boga M.D. On: 06/24/2015 03:41   Mr Lumbar Spine W Wo Contrast 06/24/2015   1. Focal osseous lesion with abnormal enhancement involving the right pedicle of L3, suspicious for possible osseous metastasis  given the findings in the thoracic spine. Question additional focal lesion within the right iliac wing as above. No pathologic fracture. 2. Right subarticular disc protrusion at L4-5, impinging upon the right L5 nerve root in the right lateral recess. 3. Shallow central disc protrusion at L5-S1 without stenosis or neural impingement. Electronically Signed   By: Jeannine Boga M.D.   On: 06/24/2015 03:53   Ct Abdomen Pelvis W Contrast 06/24/2015  1. Multiple lytic lesions involving the thoracolumbar spine and pelvis as can be seen with multiple myeloma versus metastatic disease. Destructive bone lesion in the T6 vertebral body with impression on the thecal sac and approximately 15% height loss. Electronically Signed   By: Kathreen Devoid   On: 06/24/2015 13:12   Dg C-arm 1-60 Min-no Report 06/25/2015  CLINICAL DATA: T6 Laminectomy C-ARM 1-60 MINUTES Fluoroscopy was utilized by the requesting physician.  No radiographic interpretation.      Scheduled Meds: . Chlorhexidine Gluconate Cloth  6 each Topical Daily  . dexamethasone  4 mg Intravenous Q6H  . enoxaparin (LOVENOX) injection  40 mg Subcutaneous Q24H  . mupirocin ointment  1 application Nasal BID   Continuous Infusions:    LOS: 3 days    Time spent: 15 minutes  Greater than 50% of the time spent on counseling and coordinating the care.   Leisa Lenz, MD [facility] Pager [phone]  If 7PM-7AM, please contact night-coverage [email or web address] Password [email or web address] 06/27/2015, 2:51 PM

## 2015-06-27 NOTE — Progress Notes (Signed)
Physical Therapy Treatment Patient Details Name: Bradley Hunt MRN: [id number] DOB: [birth year] Today's Date: 06/27/2015    History of Present Illness Bradley Hunt is a 45 y.o. male who initially presented to the emergency department after outpatient MRI demonstrated multiple bony lesions including one at T6 with spinal cord compression. He has been having difficulty walking for several weeks with a sensory level at approximately T6. With these findings, surgical decompression and biopsy was indicated.     PT Comments    Pt presents with improved mobility.  Will see for one more visit to practice bed mobility, recalling spinal precautions and possible progression of gait without device.    Follow Up Recommendations  No PT follow up     Equipment Recommendations  Rolling walker with 5" wheels    Recommendations for Other Services       Precautions / Restrictions Precautions Precautions: Fall;Back Precaution Booklet Issued: Yes (comment) Precaution Comments: reviewed precautions as guidelines, pt able to recall 1/3 required cues for recall.  Pt reports he knows them.   Restrictions Weight Bearing Restrictions: No    Mobility  Bed Mobility Overal bed mobility: Needs Assistance Bed Mobility: Rolling;Sidelying to Sit Rolling: Supervision Sidelying to sit: Supervision       General bed mobility comments: VCs for technique without use of rail and HOB flat.  Cues to log roll and avoid twisting.    Transfers Overall transfer level: Modified independent Equipment used: Rolling walker (2 wheeled) Transfers: Sit to/from Stand Sit to Stand: Modified independent (Device/Increase time)         General transfer comment: Pt performed with good technique, no cues needed.    Ambulation/Gait Ambulation/Gait assistance: Modified independent (Device/Increase time) Ambulation Distance (Feet): 600 Feet Assistive device: Rolling walker (2 wheeled) Gait Pattern/deviations: Step-through  pattern;WFL(Within Functional Limits) Gait velocity: decreased, guarded   General Gait Details: Improved pattern noted, no cues needed.     Stairs Stairs:  (Pt reports he does not have stairs.  )          Wheelchair Mobility    Modified Rankin (Stroke Patients Only)       Balance     Sitting balance-Leahy Scale: Good       Standing balance-Leahy Scale: Good                      Cognition Arousal/Alertness: Awake/alert Behavior During Therapy: WFL for tasks assessed/performed Overall Cognitive Status: Within Functional Limits for tasks assessed                      Exercises      General Comments        Pertinent Vitals/Pain Pain Assessment: 0-10 Pain Score: 5  Pain Location: generalized back and leg pain Pain Descriptors / Indicators: Tightness Pain Intervention(s): Monitored during session;Repositioned    Home Living                      Prior Function            PT Goals (current goals can now be found in the care plan section) Acute Rehab PT Goals Patient Stated Goal: to work on walking Potential to Achieve Goals: Good    Frequency  Min 5X/week    PT Plan Current plan remains appropriate    Co-evaluation             End of Session Equipment Utilized During Treatment: Gait belt Activity Tolerance: Patient tolerated  treatment well Patient left: in chair;with family/visitor present;with call bell/phone within reach     Time: 1240-1255 PT Time Calculation (min) (ACUTE ONLY): 15 min  Charges:  $Gait Training: 8-22 mins                    G Codes:      Cristela Blue 07/14/15, 1:07 PM  Governor Rooks, PTA pager [phone]

## 2015-06-28 DIAGNOSIS — C7951 Secondary malignant neoplasm of bone: Principal | ICD-10-CM

## 2015-06-28 DIAGNOSIS — D649 Anemia, unspecified: Secondary | ICD-10-CM

## 2015-06-28 DIAGNOSIS — K5900 Constipation, unspecified: Secondary | ICD-10-CM

## 2015-06-28 DIAGNOSIS — D72829 Elevated white blood cell count, unspecified: Secondary | ICD-10-CM

## 2015-06-28 LAB — UIFE/LIGHT CHAINS/TP QN, 24-HR UR
% BETA, Urine: 1.7 %
ALPHA 1 URINE: 0.2 %
Albumin, U: 2.7 %
Alpha 2, Urine: 0.9 %
Free Kappa/Lambda Ratio: <0.01 — ABNORMAL LOW (ref 2.04–10.37)
Free Lt Chn Excr Rate: 25.2 mg/L — ABNORMAL HIGH (ref 1.35–24.19)
GAMMA GLOBULIN URINE: 94.5 %
M-SPIKE %, URINE: 91.2 % — AB
TOTAL PROTEIN, URINE-UPE24: 341.9 mg/dL
Time: 24 hours
VOLUME, URINE-UPE24: 2500 mL

## 2015-06-28 LAB — BASIC METABOLIC PANEL
ANION GAP: 5 (ref 5–15)
BUN: 16 mg/dL (ref 6–20)
CO2: 29 mmol/L (ref 22–32)
Calcium: 9.5 mg/dL (ref 8.9–10.3)
Chloride: 103 mmol/L (ref 101–111)
Creatinine, Ser: 0.86 mg/dL (ref 0.61–1.24)
GFR calc non Af Amer: >60 mL/min (ref >60)
Glucose, Bld: 122 mg/dL — ABNORMAL HIGH (ref 65–99)
Potassium: 4.7 mmol/L (ref 3.5–5.1)
SODIUM: 137 mmol/L (ref 135–145)

## 2015-06-28 LAB — TYPE AND SCREEN
ABO/RH(D): A POS
ANTIBODY SCREEN: NEGATIVE
UNIT DIVISION: 0
Unit division: 0

## 2015-06-28 LAB — CBC
HCT: 30 % — ABNORMAL LOW (ref 39.0–52.0)
Hemoglobin: 9.9 g/dL — ABNORMAL LOW (ref 13.0–17.0)
MCH: 29.8 pg (ref 26.0–34.0)
MCHC: 33 g/dL (ref 30.0–36.0)
MCV: 90.4 fL (ref 78.0–100.0)
PLATELETS: 232 10*3/uL (ref 150–400)
RBC: 3.32 MIL/uL — ABNORMAL LOW (ref 4.22–5.81)
RDW: 13.9 % (ref 11.5–15.5)
WBC: 12.8 10*3/uL — AB (ref 4.0–10.5)

## 2015-06-28 MED ORDER — HYDROCODONE-ACETAMINOPHEN 5-325 MG PO TABS: 1.0000 | ORAL_TABLET | ORAL | Status: DC | PRN

## 2015-06-28 MED ORDER — DEXAMETHASONE 4 MG PO TABS: 4.0000 mg | ORAL_TABLET | Freq: Four times a day (QID) | ORAL | Status: DC

## 2015-06-28 MED ORDER — SENNOSIDES-DOCUSATE SODIUM 8.6-50 MG PO TABS: 1.0000 | ORAL_TABLET | Freq: Every evening | ORAL | Status: DC | PRN

## 2015-06-28 MED ORDER — BISACODYL 10 MG RE SUPP: 10.0000 mg | Freq: Once | RECTAL | Status: DC

## 2015-06-28 MED ORDER — DEXAMETHASONE 4 MG PO TABS: ORAL_TABLET | ORAL | Status: DC

## 2015-06-28 NOTE — Progress Notes (Signed)
Physical Therapy Treatment Patient Details Name: Bradley Hunt MRN: [id number] DOB: [birth year] Today's Date: 06/28/2015    History of Present Illness Bradley Hunt is a 45 y.o. male who initially presented to the emergency department after outpatient MRI demonstrated multiple bony lesions including one at T6 with spinal cord compression. He has been having difficulty walking for several weeks with a sensory level at approximately T6. With these findings, surgical decompression and biopsy was indicated.     PT Comments    Pt performed bed mobility with decreased assist and able to demonstrate correct technique after cueing.  Pt ambulated without AD and required min guard assistance.  Will inform supervising PT of pt progress and need to d/c from services at this time.    Follow Up Recommendations  No PT follow up     Equipment Recommendations  Rolling walker with 5" wheels    Recommendations for Other Services       Precautions / Restrictions Precautions Precautions: Fall;Back Precaution Booklet Issued: Yes (comment) Precaution Comments: reviewed precautions as guidelines, pt able to recall 2/3 remains to require cues for no twisting, but quickly reports oh yeah  Restrictions Weight Bearing Restrictions: No    Mobility  Bed Mobility Overal bed mobility: Needs Assistance Bed Mobility: Rolling;Sidelying to Sit Rolling: Supervision Sidelying to sit: Supervision       General bed mobility comments: VCs for technique without use of rail and HOB flat.  Cues to log roll and avoid twisting.  Pt performed x1 with cueing and then again without cueing and able to demonstrate correct technique without twisting.    Transfers Overall transfer level: Modified independent Equipment used: Rolling walker (2 wheeled) Transfers: Sit to/from Stand Sit to Stand: Modified independent (Device/Increase time)         General transfer comment: Pt performed with good technique, no cues needed.     Ambulation/Gait Ambulation/Gait assistance: Min guard Ambulation Distance (Feet): 150 Feet Assistive device: None Gait Pattern/deviations: Step-through pattern;WFL(Within Functional Limits) Gait velocity: decreased, guarded   General Gait Details: Cues for increasing B stride length and increased reciprocal armswing to improve balance.   Stairs            Wheelchair Mobility    Modified Rankin (Stroke Patients Only)       Balance                                    Cognition Arousal/Alertness: Awake/alert Behavior During Therapy: WFL for tasks assessed/performed Overall Cognitive Status: Within Functional Limits for tasks assessed                      Exercises      General Comments        Pertinent Vitals/Pain Pain Assessment: 0-10 Pain Score: 5  Pain Location: generalized leg and back pain.   Pain Descriptors / Indicators: Tightness Pain Intervention(s): Monitored during session;Repositioned    Home Living                      Prior Function            PT Goals (current goals can now be found in the care plan section) Acute Rehab PT Goals Patient Stated Goal: to work on walking Potential to Achieve Goals: Good Progress towards PT goals: Progressing toward goals    Frequency  Min 5X/week    PT Plan Current  plan remains appropriate    Co-evaluation             End of Session Equipment Utilized During Treatment: Gait belt Activity Tolerance: Patient tolerated treatment well Patient left: in chair;with family/visitor present;with call bell/phone within reach     Time: 1154-1204 PT Time Calculation (min) (ACUTE ONLY): 10 min  Charges:  $Therapeutic Activity: 8-22 mins                    G Codes:      Cristela Blue 07-10-15, 12:12 PM Governor Rooks, PTA pager [phone]

## 2015-06-28 NOTE — Progress Notes (Signed)
No issues overnight. No significant c/o x constipation.  EXAM:  BP 124/75 mmHg  Pulse 63  Temp(Src) 97.9 F (36.6 C) (Oral)  Resp 18  Ht 6\' 3"  (1.905 m)  Wt 100.245 kg (221 lb)  BMI 27.62 kg/m2  SpO2 100%  Awake, alert, oriented  Speech fluent, appropriate  CN grossly intact  5/5 BUE/BLE  Wound c/d/i  IMPRESSION:  45 y.o. male s/p thoracic tumor decompression. Pathology pending.  PLAN: - Is stable for d/c from neurosurgical standpoint - Further treatment pending pathology results - Will need f/u in my office in 2 weeks.

## 2015-06-28 NOTE — Progress Notes (Signed)
PROGRESS NOTE    Bradley Hunt  TKW:[id number] DOB: 1970/08/14 DOA: 06/23/2015 PCP: Barbette Merino, MD    Brief Narrative:  45 y.o. male with no signficant past medical history who presented to [facility] with back pain, ataxia and leg numbness for past 1 month prior to this admission. Patient has been seen by primary care physician who prescribed NSAIDs and exercise but the pain has gotten progressively worse to the point where patient could not ambulate normally and it interfered with his sleep. Patient reported numbness in the trunk area, abdomen and has had fecal incontinence. His primary care physician subsequently prescribed Decadron and referred him to ER for evaluation.  Findings on MRI suggestive of osseous metastatic disease with pathologic fracture in T6. Neurosurgery has seen the pt in consultation. Pt is s/p T6 laminectomy, bilateral transpedicular approach for resection of tumor, decompression of thecal sac on 06/25/2015.  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 He still requires intermitted IV doses of morphine. -Continue decadron 4 mg IV Q 6 hours . Spoke with neurosurgery, will taper over one week. -Physical therapy - no PT follow up required; rolling walker ordered -Patient will need to follow up with neurosurgery, Dr. Kathyrn Sheriff, in 2 weeks. -Pending results of pathology, patient will need to follow up with oncology.  Anemia of chronic disease -Likely from  malignancy -Hemoglobin 9.9 -Continue to monitor CBC  Constipation -Likely secondary to pain meds -Will give senokot and prune juice -Continue to monitor  DVT Prophylaxis  Lovenox  Code Status: Full  Family Communication: None at bedside.  Disposition Plan: Admitted.   Consultants Neurosurgery, Dr. Kathyrn Sheriff  Procedures  T6 laminectomy, bilateral transpedicular approach for resection of tumor, decompression of thecal sac - 06/25/2015  Antibiotics   Anti-infectives    Start     Dose/Rate Route Frequency Ordered Stop   06/25/15 1215  bacitracin 50,000 Units in sodium chloride irrigation 0.9 % 500 mL irrigation  Status:  Discontinued       As needed 06/25/15 1238 06/25/15 1426   06/25/15 1032  ceFAZolin (ANCEF) 2-4 GM/100ML-% IVPB    Comments:  Marinda Elk   : cabinet override      06/25/15 1032 06/25/15 1132      Subjective:   [name] seen and examined today.  Patient states he still has pain however feels it is getting mildly better. Patient does complain of constipation this morning. Denies any chest pain, shortness of breath, abdominal pain, nausea or vomiting.  Objective:   Filed Vitals:   06/27/15 0604 06/27/15 1300 06/27/15 2019 06/28/15 0500  BP: 116/74 142/89 136/73 124/75  Pulse: 66 72 72 63  Temp: 99 F (37.2 C) 98 F (36.7 C) 97.8 F (36.6 C) 97.9 F (36.6 C)  TempSrc: Oral Oral Oral Oral  Resp: 16 16 18 18   Height:      Weight:      SpO2: 100% 100% 100% 100%    Intake/Output Summary (Last 24 hours) at 06/28/15 1357 Last data filed at 06/28/15 0800  Gross per 24 hour  Intake    240 ml  Output      0 ml  Net    240 ml   Filed Weights   06/24/15 1050  Weight: 100.245 kg (221 lb)    Exam  General: Well developed, well nourished, NAD, appears stated age  69: NCAT,mucous membranes moist.   Cardiovascular: S1 S2 auscultated, no murmurs, RRR  Respiratory: Clear to auscultation bilaterally   Abdomen: Soft, nontender,  nondistended, + bowel sounds  Extremities: warm dry without cyanosis clubbing or edema  Neuro: AAOx3, nonfocal  Psych: Normal affect and demeanor   Data Reviewed: I have personally reviewed following labs and imaging studies  CBC:  Recent Labs Lab 06/24/15 0437 06/25/15 1333 06/28/15 0539  WBC 12.6*  --  12.8*  NEUTROABS 9.0*  --   --   HGB 10.6* 8.5* 9.9*  HCT 31.3* 25.0* 30.0*  MCV 90.2  --  90.4  PLT 215  --  226   Basic Metabolic Panel:  Recent Labs Lab 06/24/15 0437 06/25/15 1333 06/28/15 0539  NA 140 142 137  K 4.1 4.2 4.7  CL 109  --  103  CO2 23  --  29  GLUCOSE 99 123* 122*  BUN 13  --  16  CREATININE 0.96  --  0.86  CALCIUM 9.9  --  9.5   GFR: Estimated Creatinine Clearance: 129.6 mL/min (by C-G formula based on Cr of 0.86). Liver Function Tests:  Recent Labs Lab 06/24/15 0437  AST 24  ALT 18  ALKPHOS 40  BILITOT 1.0  PROT 6.7  ALBUMIN 4.2   No results for input(s): LIPASE, AMYLASE in the last 168 hours. No results for input(s): AMMONIA in the last 168 hours. Coagulation Profile:  Recent Labs Lab 06/24/15 1541  INR 1.14   Cardiac Enzymes: No results for input(s): CKTOTAL, CKMB, CKMBINDEX, TROPONINI in the last 168 hours. BNP (last 3 results) No results for input(s): PROBNP in the last 8760 hours. HbA1C: No results for input(s): HGBA1C in the last 72 hours. CBG: No results for input(s): GLUCAP in the last 168 hours. Lipid Profile: No results for input(s): CHOL, HDL, LDLCALC, TRIG, CHOLHDL, LDLDIRECT in the last 72 hours. Thyroid Function Tests: No results for input(s): TSH, T4TOTAL, FREET4, T3FREE, THYROIDAB in the last 72 hours. Anemia Panel: No results for input(s): VITAMINB12, FOLATE, FERRITIN, TIBC, IRON, RETICCTPCT in the last 72 hours. Urine analysis: No results found for: COLORURINE, APPEARANCEUR, LABSPEC, PHURINE, GLUCOSEU, HGBUR, BILIRUBINUR, KETONESUR, PROTEINUR, UROBILINOGEN, NITRITE, LEUKOCYTESUR Sepsis  Labs: @LABRCNTIP (procalcitonin:4,lacticidven:4)  ) Recent Results (from the past 240 hour(s))  Surgical pcr screen     Status: Abnormal   Collection Time: 06/24/15 11:53 PM  Result Value Ref Range Status   MRSA, PCR NEGATIVE NEGATIVE Final   Staphylococcus aureus POSITIVE (A) NEGATIVE Final    Comment:        The Xpert SA Assay (FDA approved for NASAL specimens in patients over 21 years of age), is one component of a comprehensive surveillance program.  Test performance has been validated by [facility] for patients greater than or equal to 39 year old. It is not intended to diagnose infection nor to guide or monitor treatment.       Radiology Studies: No results found.   Scheduled Meds: . Chlorhexidine Gluconate Cloth  6 each Topical Daily  . dexamethasone  4 mg Intravenous Q6H  . enoxaparin (LOVENOX) injection  40 mg Subcutaneous Q24H  . mupirocin ointment  1 application Nasal BID   Continuous  Infusions:    LOS: 4 days   Time Spent in minutes   30 minutes  Kaitelyn Jamison D.O. on 06/28/2015 at 1:57 PM  Between 7am to 7pm - Pager - [phone]  After 7pm go to [email or web address] - password [email or web address]  And look for the night coverage person covering for me after hours  [facility] Office  [phone]

## 2015-06-28 NOTE — Discharge Instructions (Signed)

## 2015-06-28 NOTE — Discharge Summary (Signed)
Physician Discharge Summary  Bradley Hunt YKD:[id number] DOB: 11-Dec-1970 DOA: 06/23/2015  PCP: Barbette Merino, MD  Admit date: 06/23/2015 Discharge date: 06/28/2015  Time spent: 45 minutes  Recommendations for Outpatient Follow-up:  Patient will be discharged to home.  Patient will need to follow up with primary care provider within one week of discharge, repeat CBC in one week. Follow up with Dr. Kathyrn Sheriff, neurosurgery in 2 weeks. Patient should continue medications as prescribed.  Patient should follow a regular diet.   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 Myrene Buddy Divante Hunt is a 45 y.o. male with no signficant past medical history who presents with back pain and ataxia and leg numbness.  The patient was in his usual health until about 1 month ago when he developed upper back pain, radiating around and "tight all across my chest" associated with some numbness, difficulty falling asleep. He saw his PCP who prescribed NSAIDs and exercise (he has a history of some back pain that has been treated with injections in the past), but the pain was still severe, and in fact progressed.  Now over the last two weeks, he has had ataxia, his co-workers have noticed he "walks like I'm drunk", he has developed leg weakness and numbness to the trunk bilaterally and across the abdomen, anterior and inner thighs, and he has had fecal incontinence. 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 He still requires intermitted IV doses of morphine. -Placed on decadron 4 mg IV Q 6 hours . Spoke with neurosurgery, will taper over one week. -Physical therapy - no PT follow up required; rolling walker ordered -Patient will need to follow up with neurosurgery, Dr. Kathyrn Sheriff, in 2 weeks. -Pending results of pathology, patient will need to follow up with oncology.  Anemia of chronic disease -Likely from malignancy -Hemoglobin 9.9  Constipation -Likely secondary to pain meds -Resolved  Leukocytosis -Likely secondary to steroids -Repeat CBC in 1 week  Consultants Neurosurgery, Dr. Kathyrn Sheriff  Procedures  T6 laminectomy, bilateral transpedicular approach for resection of tumor, decompression of thecal sac - 06/25/2015  Discharge Exam: Filed Vitals:   06/27/15 2019 06/28/15 0500  BP: 136/73 124/75  Pulse: 72 63  Temp: 97.8 F (36.6 C) 97.9 F (36.6 C)  Resp: 18 18   Exam  General: Well developed, well nourished, NAD, appears stated age  HEENT: NCAT,mucous membranes moist.   Cardiovascular: S1 S2 auscultated, no murmurs, RRR  Respiratory: Clear to auscultation bilaterally  Abdomen: Soft, nontender, nondistended, + bowel sounds  Extremities: warm dry without cyanosis clubbing or edema  Neuro: AAOx3, nonfocal  Psych: Normal affect and demeanor  Discharge Instructions      Discharge Instructions    Discharge instructions  Complete by:  As directed   Patient will be discharged to home.  Patient will need to follow up with primary care provider within one week of discharge, repeat CBC in one  week. Follow up with Dr. Kathyrn Sheriff, neurosurgery in 2 weeks. Patient should continue medications as prescribed.  Patient should follow a regular diet.            Medication List    STOP taking these medications        ibuprofen 200 MG tablet  Commonly known as:  ADVIL,MOTRIN      TAKE these medications        dexamethasone 4 MG tablet  Commonly known as:  DECADRON  Take one tablet four times per day for 2 days, then take one tablet three times per day for 2 days, then take one tablet twice daily for 2 days, then take one tablet daily for 2 days.     HYDROcodone-acetaminophen 5-325 MG tablet  Commonly known as:  NORCO/VICODIN  Take 1-2 tablets by mouth every 4 (four) hours as needed for moderate pain.     senna-docusate 8.6-50 MG tablet  Commonly known as:  Senokot-S  Take 1 tablet by mouth at bedtime as needed for mild constipation.       No Known Allergies Follow-up Information    Follow up with NUNDKUMAR, NEELESH, C, MD In 2 weeks.   Specialty:  Neurosurgery   Contact information:   [address] [place] [address] [phone]       Schedule an appointment as soon as possible for a visit with Barbette Merino, MD.   Specialty:  Internal Medicine   Why:  Hospital follow up   Contact information:   [place] [place] [address] [phone]       Follow up with Eilleen Kempf., MD. Schedule an appointment as soon as possible for a visit in 2 weeks.   Specialty:  Oncology   Contact information:   [address] [place] [address] [phone]        The results of significant diagnostics from this hospitalization (including imaging, microbiology, ancillary and laboratory) are listed below for reference.    Significant Diagnostic Studies: Ct Chest W Contrast  06/24/2015  CLINICAL DATA:  Chest tightness from the chest to the lower legs. Back pain. EXAM: CT CHEST, ABDOMEN AND PELVIS WITH CONTRAST TECHNIQUE: Multidetector CT  imaging of the chest, abdomen and pelvis was performed following the standard protocol with IV contrast. CONTRAST:  100 mL 1 ISOVUE-300 IOPAMIDOL (ISOVUE-300) INJECTION 61% COMPARISON:  None. FINDINGS: CT CHEST FINDINGS Mediastinum/Lymph Nodes: No masses or pathologically enlarged lymph nodes identified on this un-enhanced exam. Normal heart size. No pericardial effusion. Normal thoracic aorta. Lungs/Pleura: No focal consolidation, pleural effusion or pneumothorax. Musculoskeletal: Destructive bone lesion in the T6 vertebral body with impression on the thecal sac and approximately 15% height loss. The bone lesion extends into pedicles bilaterally. There is a bone lesion in the T1 vertebral body. There is a left T4 vertebral body lesion extending into the left pedicle. There is a left T4 spinous process bone lesion. There is a small bone lesion in the left T3 pedicle. There is a small bone lesion in the inferior T10 vertebral body. There is a lytic bone lesion in the T11 vertebral body and right T11 pedicle. There is a small bone lesion in the left inferior T11 facet. There is a bone lesion in the left T12 facet. Other: Mild retroareolar fibroglandular  tissue as can be seen with mild gynecomastia. CT ABDOMEN PELVIS FINDINGS Hepatobiliary: Normal liver.  Normal gallbladder. Pancreas: Normal. Spleen: Normal. Adrenals/Urinary Tract: No evidence of urolithiasis or hydronephrosis. No definite mass visualized on this un-enhanced exam. Stomach/Bowel: No evidence of obstruction, inflammatory process, or abnormal fluid collections. Normal appendix. Vascular/Lymphatic: No pathologically enlarged lymph nodes. No evidence of abdominal aortic aneurysm. Reproductive: No mass or other significant abnormality. Other: None. Musculoskeletal: There is a possible left L1 vertebral body lesion extending into the pedicle. There is a right L3 vertebral body lesion extending into the pedicle and facet. There is a small bone lesion in the  right superior acetabulum. There is a bone lesion in the left anteromedial acetabulum. There is a small bone lesion in the left ilium. IMPRESSION: 1. Multiple lytic lesions involving the thoracolumbar spine and pelvis as can be seen with multiple myeloma versus metastatic disease. Destructive bone lesion in the T6 vertebral body with impression on the thecal sac and approximately 15% height loss. Electronically Signed   By: Kathreen Devoid   On: 06/24/2015 13:12   Mr Thoracic Spine W Wo Contrast  06/24/2015  ADDENDUM REPORT: 06/24/2015 07:56 ADDENDUM: Upon further review, there is a probable additional discrete lesion within the right posterior aspect of the T1 vertebral body measuring approximately 15 mm (series 3, image 7). Additional lesion within the right superior aspect of the T11 vertebral body measures approximately 17 mm (series 3, image 7). Probable tiny 7 mm lesion within the posterior aspect of T8 (series 3, image 8). No associated pathologic fracture or extra osseous extension of tumor with these additional lesions. In fact, heterogeneous marrow signal intensity seen on axial merge sequences is highly concerning for diffuse osseous metastases even though these are not discretely delineated on STIR and post-contrast sequences. Probable involvement of the bilateral ribs noted. Benign hemangioma and with mild chronic height loss noted at T4. Electronically Signed   By: Jeannine Boga M.D.   On: 06/24/2015 07:56  06/24/2015  CLINICAL DATA:  Initial evaluation for acute back pain. EXAM: MRI THORACIC SPINE WITHOUT AND WITH CONTRAST TECHNIQUE: Multiplanar and multiecho pulse sequences of the thoracic spine were obtained without and with intravenous contrast. CONTRAST:  28m MULTIHANCE GADOBENATE DIMEGLUMINE 529 MG/ML IV SOLN COMPARISON:  None available. FINDINGS: Vertebral bodies are normally aligned with preservation of the normal thoracic kyphosis. No listhesis or malalignment. There is abnormal  height loss with heterogeneity and involving the T6 vertebral body. Abnormal posterior convex bowing of the posterior vertebral body contour more with abnormal soft tissue density within the ventral epidural space. This enhances mildly on post gadolinium sequence. Finding suspicious for possible osseous metastasis with associated pathologic fracture. There is associated 30% height loss. Abnormal soft tissue density within the ventral epidural space abuts the thoracic spinal cord and results in moderate to severe canal stenosis. Thecal sac measures approximately 8-9 mm in AP diameter at this level. Probable extension of tumor and to the posterior elements of T6, most evident within the right pedicle and lamina. Extension of tumor into the T5-6 neural foramina, particularly on the right (series 3, image 7). There is suggestion of possible additional ovoid lesion within the T9 vertebral body (series 3, image 11). This measures approximately 15 mm and spans the entirety of the vertebral body. No other definite osseous lesions identified. Possible hemangioma noted within the T4 vertebral body. Question of faint edema/myelomalacia within the thoracic spinal cord at the level of T6 related to compression (Series 3, image 10).  Signal intensity within the thoracic spinal cord is otherwise normal. Paraspinous soft tissues demonstrate no other acute abnormality. Partially visualized lungs are clear. No significant degenerative changes within the thoracic spine. No focal disc herniation. No other significant stenosis. IMPRESSION: 1. Abnormal appearance of the T6 vertebral body, highly suspicious for possible osseous metastasis. Associated pathologic fracture with up to 30% height loss. There is associated abnormal soft tissue density within the ventral epidural space, concerning for epidural tumor. Associated fairly severe canal stenosis. Question mild edema within the thoracic spinal cord related to compression at this level.  2. Question additional lesion involving the T9 vertebral body as above. No other definite focal lesions within the thoracic spine. Electronically Signed: By: Jeannine Boga M.D. On: 06/24/2015 03:41   Mr Lumbar Spine W Wo Contrast  06/24/2015  CLINICAL DATA:  Initial evaluation for acute back pain. EXAM: MRI LUMBAR SPINE WITHOUT AND WITH CONTRAST TECHNIQUE: Multiplanar and multiecho pulse sequences of the lumbar spine were obtained without and with intravenous contrast. CONTRAST:  20 cc of MultiHance. COMPARISON:  Prior study from 06/18/2011. FINDINGS: Vertebral bodies are normally aligned with preservation of the normal lumbar lordosis. Vertebral body heights are well preserved. No fracture or malalignment. There is abnormal STIR signal intensity with enhancement involving the right pedicle of L3 (series 3, image 4), concerning for possible focal osseous lesion. This is not seen on previous MRI from 2013. Possible additional lesion within the right iliac wing adjacent to the SI joint (series 4, image 34). No other focal osseous lesions. Signal intensity within the vertebral body bone marrow otherwise within normal limits. Vertebral body heights are preserved. No acute or chronic fracture. Degenerative endplate Schmorl's node present at the superior endplate of L5. Conus medullaris terminates at the L2 level. Signal intensity within the visualized cord is normal. Nerve roots of the cauda equina within normal limits. Paraspinous soft tissues demonstrate no acute abnormality. L1-2: No significant disc bulge or focal disc herniation. No stenosis. L2-3:  No disc herniation or significant disc bulge.  No stenosis. L3-4: No disc herniation are significant disc bulge. No significant stenosis. L4-5: Right subarticular disc protrusion encroaching upon the right lateral recess, impinging upon the transiting L5 nerve root (series 14, image 29). Moderate lateral recess stenosis with mild canal narrowing. Foramina  remain widely patent. L5-S1: Shallow central disc protrusion with associated annular fissure. No significant stenosis or neural impingement. Foramina remain patent. IMPRESSION: 1. Focal osseous lesion with abnormal enhancement involving the right pedicle of L3, suspicious for possible osseous metastasis given the findings in the thoracic spine. Question additional focal lesion within the right iliac wing as above. No pathologic fracture. 2. Right subarticular disc protrusion at L4-5, impinging upon the right L5 nerve root in the right lateral recess. 3. Shallow central disc protrusion at L5-S1 without stenosis or neural impingement. Electronically Signed   By: Jeannine Boga M.D.   On: 06/24/2015 03:53   Ct Abdomen Pelvis W Contrast  06/24/2015  CLINICAL DATA:  Chest tightness from the chest to the lower legs. Back pain. EXAM: CT CHEST, ABDOMEN AND PELVIS WITH CONTRAST TECHNIQUE: Multidetector CT imaging of the chest, abdomen and pelvis was performed following the standard protocol with IV contrast. CONTRAST:  100 mL 1 ISOVUE-300 IOPAMIDOL (ISOVUE-300) INJECTION 61% COMPARISON:  None. FINDINGS: CT CHEST FINDINGS Mediastinum/Lymph Nodes: No masses or pathologically enlarged lymph nodes identified on this un-enhanced exam. Normal heart size. No pericardial effusion. Normal thoracic aorta. Lungs/Pleura: No focal consolidation, pleural effusion or pneumothorax. Musculoskeletal:  Destructive bone lesion in the T6 vertebral body with impression on the thecal sac and approximately 15% height loss. The bone lesion extends into pedicles bilaterally. There is a bone lesion in the T1 vertebral body. There is a left T4 vertebral body lesion extending into the left pedicle. There is a left T4 spinous process bone lesion. There is a small bone lesion in the left T3 pedicle. There is a small bone lesion in the inferior T10 vertebral body. There is a lytic bone lesion in the T11 vertebral body and right T11 pedicle. There  is a small bone lesion in the left inferior T11 facet. There is a bone lesion in the left T12 facet. Other: Mild retroareolar fibroglandular tissue as can be seen with mild gynecomastia. CT ABDOMEN PELVIS FINDINGS Hepatobiliary: Normal liver.  Normal gallbladder. Pancreas: Normal. Spleen: Normal. Adrenals/Urinary Tract: No evidence of urolithiasis or hydronephrosis. No definite mass visualized on this un-enhanced exam. Stomach/Bowel: No evidence of obstruction, inflammatory process, or abnormal fluid collections. Normal appendix. Vascular/Lymphatic: No pathologically enlarged lymph nodes. No evidence of abdominal aortic aneurysm. Reproductive: No mass or other significant abnormality. Other: None. Musculoskeletal: There is a possible left L1 vertebral body lesion extending into the pedicle. There is a right L3 vertebral body lesion extending into the pedicle and facet. There is a small bone lesion in the right superior acetabulum. There is a bone lesion in the left anteromedial acetabulum. There is a small bone lesion in the left ilium. IMPRESSION: 1. Multiple lytic lesions involving the thoracolumbar spine and pelvis as can be seen with multiple myeloma versus metastatic disease. Destructive bone lesion in the T6 vertebral body with impression on the thecal sac and approximately 15% height loss. Electronically Signed   By: Kathreen Devoid   On: 06/24/2015 13:12   Dg C-arm 1-60 Min-no Report  06/25/2015  CLINICAL DATA: T6 Laminectomy C-ARM 1-60 MINUTES Fluoroscopy was utilized by the requesting physician.  No radiographic interpretation.    Microbiology: Recent Results (from the past 240 hour(s))  Surgical pcr screen     Status: Abnormal   Collection Time: 06/24/15 11:53 PM  Result Value Ref Range Status   MRSA, PCR NEGATIVE NEGATIVE Final   Staphylococcus aureus POSITIVE (A) NEGATIVE Final    Comment:        The Xpert SA Assay (FDA approved for NASAL specimens in patients over 10 years of age), is one  component of a comprehensive surveillance program.  Test performance has been validated by [facility] for patients greater than or equal to 103 year old. It is not intended to diagnose infection nor to guide or monitor treatment.      Labs: Basic Metabolic Panel:  Recent Labs Lab 06/24/15 0437 06/25/15 1333 06/28/15 0539  NA 140 142 137  K 4.1 4.2 4.7  CL 109  --  103  CO2 23  --  29  GLUCOSE 99 123* 122*  BUN 13  --  16  CREATININE 0.96  --  0.86  CALCIUM 9.9  --  9.5   Liver Function Tests:  Recent Labs Lab 06/24/15 0437  AST 24  ALT 18  ALKPHOS 40  BILITOT 1.0  PROT 6.7  ALBUMIN 4.2   No results for input(s): LIPASE, AMYLASE in the last 168 hours. No results for input(s): AMMONIA in the last 168 hours. CBC:  Recent Labs Lab 06/24/15 0437 06/25/15 1333 06/28/15 0539  WBC 12.6*  --  12.8*  NEUTROABS 9.0*  --   --  HGB 10.6* 8.5* 9.9*  HCT 31.3* 25.0* 30.0*  MCV 90.2  --  90.4  PLT 215  --  232   Cardiac Enzymes: No results for input(s): CKTOTAL, CKMB, CKMBINDEX, TROPONINI in the last 168 hours. BNP: BNP (last 3 results) No results for input(s): BNP in the last 8760 hours.  ProBNP (last 3 results) No results for input(s): PROBNP in the last 8760 hours.  CBG: No results for input(s): GLUCAP in the last 168 hours.     SignedCristal Ford  [facility] 06/28/2015, 3:14 PM

## 2015-06-29 ENCOUNTER — Telehealth: Payer: Self-pay | Admitting: Hematology

## 2015-06-29 NOTE — Telephone Encounter (Signed)
Lt mess regarding new pt appt.  °

## 2015-07-14 ENCOUNTER — Telehealth: Payer: Self-pay | Admitting: *Deleted

## 2015-07-14 NOTE — Telephone Encounter (Signed)
Per HIM I have sacheduled appts. She will contact patient

## 2015-07-18 ENCOUNTER — Encounter: Payer: Self-pay | Admitting: Hematology and Oncology

## 2015-07-18 ENCOUNTER — Telehealth: Payer: Self-pay | Admitting: Hematology and Oncology

## 2015-07-18 ENCOUNTER — Ambulatory Visit (HOSPITAL_BASED_OUTPATIENT_CLINIC_OR_DEPARTMENT_OTHER): Payer: Self-pay | Admitting: Hematology and Oncology

## 2015-07-18 VITALS — BP 147/86 | HR 77 | Temp 98.0°F | Resp 18 | Ht 75.0 in | Wt 223.6 lb

## 2015-07-18 DIAGNOSIS — G9529 Other cord compression: Secondary | ICD-10-CM

## 2015-07-18 DIAGNOSIS — C7951 Secondary malignant neoplasm of bone: Secondary | ICD-10-CM | POA: Insufficient documentation

## 2015-07-18 DIAGNOSIS — C9001 Multiple myeloma in remission: Secondary | ICD-10-CM | POA: Insufficient documentation

## 2015-07-18 DIAGNOSIS — C9002 Multiple myeloma in relapse: Secondary | ICD-10-CM | POA: Insufficient documentation

## 2015-07-18 DIAGNOSIS — G9520 Unspecified cord compression: Secondary | ICD-10-CM

## 2015-07-18 DIAGNOSIS — C9000 Multiple myeloma not having achieved remission: Secondary | ICD-10-CM

## 2015-07-18 DIAGNOSIS — D630 Anemia in neoplastic disease: Secondary | ICD-10-CM

## 2015-07-18 NOTE — Assessment & Plan Note (Signed)
This is likely anemia of chronic disease. The patient denies recent history of bleeding such as epistaxis, hematuria or hematochezia. He is asymptomatic from the anemia. We will observe for now.  He does not require transfusion now.   

## 2015-07-18 NOTE — Assessment & Plan Note (Signed)
I reinforced the importance of complete blood work, 24-hour urine study, skeletal survey, staging bone marrow aspirate and biopsy before we can proceed with treatment. The patient has no medical insurance and I will get him assistance to complete application for medical insurance. I will order sedated bone marrow biopsy on Thursday. I will see him back next week to review test results with plan to start him on Velcade-based therapy on 07/31/2015. I will get him chemotherapy education class.

## 2015-07-18 NOTE — Progress Notes (Signed)
Met with patient in my office with financial concerns whom was referred by Cameo(RN). Patient is uninsured. Patient has not been able to work in a month and does not receive any disability from the job. Reviewed notes on hospital side from recent admission. Patient was screened and seen by financial counselor and was found ineligible for Medicaid but was given a Warehouse manager. Asked patient if he received the application and he states yes but had not completed yet. I went over the application again with the patient and explained the documents needed. Also advised patient proof of income would be needed to apply for drug replacement assistance as well for treatment. Advised patient he would automatically receive a 55% discount for being uninsured but the [facility] FAA would determine if he is eligible for more coverage. Patient verbalized understanding. Advised patient he may leave the documents up front with Ms. Wilma on Thurs 6/22 when he comes for lab. She will give them to me when I come in. Gave patient another application. Patient has my card for any additional financial questions or concerns.

## 2015-07-18 NOTE — Progress Notes (Signed)
Bradley Hunt CONSULT NOTE  Patient Care Team: [name], MD as PCP - General (Internal Medicine)  CHIEF COMPLAINTS/PURPOSE OF CONSULTATION:  Multiple myeloma  HISTORY OF PRESENTING ILLNESS:  Bradley Hunt 45 y.o. male is here because of recent diagnosis of multiple myeloma. He presented with gait ataxia and severe back pain recently and was hospitalized after presentation with minor cord compression require surgical intervention I review his records extensively and summarized as follows:   Multiple myeloma not having achieved remission ([place])   06/23/2015 - 06/28/2015 Hospital Admission The patient was admitted to the hospital due to gait ataxia and back pain. He was subsequently found to have cord compression underwent surgery and was discharged home   06/24/2015 Imaging Abnormal appearance of the T6 vertebral body, highly suspicious for possible osseous metastasis. Associated pathologic fracture withup to 30% height loss. There is associated abnormal soft tissue density within the ventral epidural space,   06/24/2015 Imaging MRI lumbar: Focal osseous lesion with abnormal enhancement involving the right pedicle of L3, suspicious for possible osseous metastasisgiven the findings in the thoracic spine. Question additional focal lesion within the right iliac wing as above.     06/25/2015 Pathology Results Accession: [id number] bone biopsy come from plasma cell neoplasm.   06/25/2015 Surgery He had T6 laminectomy, bilateral transpedicular approach for resection of tumor, decompression of thecal sac and microdissection   Currently, he has minor back pain and tolerated ibuprofen well Patient denies history of recurrent infection or atypical infections such as shingles of meningitis. Denies chills, night sweats, anorexia or abnormal weight loss. He complained of paresthesia with sensation of tightness and tingling sensation in the lower extremities. He denies recent falls. He denies  weakness or incontinence  MEDICAL HISTORY:  History reviewed. No pertinent past medical history.  SURGICAL HISTORY: Past Surgical History  Procedure Laterality Date  . Anterior cruciate ligament repair    . Laminectomy N/A 06/25/2015    Procedure: Thoracic six LAMINECTOMY RESECTION FOR TUMOR;  Surgeon: Consuella Lose, MD;  Location: [place];  Service: Neurosurgery;  Laterality: N/A;    SOCIAL HISTORY: Social History   Social History  . Marital Status: Single    Spouse Name: N/A  . Number of Children: N/A  . Years of Education: N/A   Occupational History  . Not on file.   Social History Main Topics  . Smoking status: Former Research scientist (life sciences)  . Smokeless tobacco: Never Used  . Alcohol Use: No  . Drug Use: No  . Sexual Activity: Yes     Comment: friend Amadou next of kin. 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 Not on file   Social History Narrative    FAMILY HISTORY: Family History  Problem Relation Age of Onset  . Cancer Neg Hx     ALLERGIES:  has No Known Allergies.  MEDICATIONS:  Current Outpatient Prescriptions  Medication Sig Dispense Refill  . HYDROcodone-acetaminophen (NORCO/VICODIN) 5-325 MG tablet Take 1-2 tablets by mouth every 4 (four) hours as needed for moderate pain. 30 tablet 0  . ibuprofen (ADVIL,MOTRIN) 800 MG tablet Take 800 mg by mouth every 8 (eight) hours as needed.    . senna-docusate (SENOKOT-S) 8.6-50 MG tablet Take 1 tablet by mouth at bedtime as needed for mild constipation. 30 tablet 0   No current facility-administered medications for this visit.    REVIEW OF SYSTEMS:   Eyes: Denies blurriness of vision, double vision or watery eyes Ears, nose, mouth, throat,  and face: Denies mucositis or sore throat Respiratory: Denies cough, dyspnea or wheezes Cardiovascular: Denies palpitation, chest discomfort or lower extremity swelling Gastrointestinal:  Denies nausea, heartburn or change in bowel habits Skin: Denies  abnormal skin rashes Lymphatics: Denies new lymphadenopathy or easy bruising Behavioral/Psych: Mood is stable, no new changes  All other systems were reviewed with the patient and are negative.  PHYSICAL EXAMINATION: ECOG PERFORMANCE STATUS: 1 - Symptomatic but completely ambulatory  Filed Vitals:   07/18/15 1531  BP: 147/86  Pulse: 77  Temp: 98 F (36.7 C)  Resp: 18   Filed Weights   07/18/15 1531  Weight: 223 lb 9.6 oz (101.424 kg)    GENERAL:alert, no distress and comfortable SKIN: skin color, texture, turgor are normal, no rashes or significant lesions EYES: normal, conjunctiva are pink and non-injected, sclera clear OROPHARYNX:no exudate, no erythema and lips, buccal mucosa, and tongue normal  NECK: supple, thyroid normal size, non-tender, without nodularity LYMPH:  no palpable lymphadenopathy in the cervical, axillary or inguinal LUNGS: clear to auscultation and percussion with normal breathing effort HEART: regular rate & rhythm and no murmurs and no lower extremity edema ABDOMEN:abdomen soft, non-tender and normal bowel sounds Musculoskeletal:no cyanosis of digits and no clubbing . Well-healed surgical scar on his back PSYCH: alert & oriented x 3 with fluent speech NEURO: No focal neurological deficit. He has very mild gait ataxia. He has mild sensory deficit affecting both legs  LABORATORY DATA:  I have reviewed the data as listed Lab Results  Component Value Date   WBC 12.8* 06/28/2015   HGB 9.9* 06/28/2015   HCT 30.0* 06/28/2015   MCV 90.4 06/28/2015   PLT 232 06/28/2015    RADIOGRAPHIC STUDIES:I reviewed imaging study with the patient I have personally reviewed the radiological images as listed and agreed with the findings in the report. Ct Chest W Contrast  06/24/2015  CLINICAL DATA:  Chest tightness from the chest to the lower legs. Back pain. EXAM: CT CHEST, ABDOMEN AND PELVIS WITH CONTRAST TECHNIQUE: Multidetector CT imaging of the chest, abdomen and  pelvis was performed following the standard protocol with IV contrast. CONTRAST:  100 mL 1 ISOVUE-300 IOPAMIDOL (ISOVUE-300) INJECTION 61% COMPARISON:  None. FINDINGS: CT CHEST FINDINGS Mediastinum/Lymph Nodes: No masses or pathologically enlarged lymph nodes identified on this un-enhanced exam. Normal heart size. No pericardial effusion. Normal thoracic aorta. Lungs/Pleura: No focal consolidation, pleural effusion or pneumothorax. Musculoskeletal: Destructive bone lesion in the T6 vertebral body with impression on the thecal sac and approximately 15% height loss. The bone lesion extends into pedicles bilaterally. There is a bone lesion in the T1 vertebral body. There is a left T4 vertebral body lesion extending into the left pedicle. There is a left T4 spinous process bone lesion. There is a small bone lesion in the left T3 pedicle. There is a small bone lesion in the inferior T10 vertebral body. There is a lytic bone lesion in the T11 vertebral body and right T11 pedicle. There is a small bone lesion in the left inferior T11 facet. There is a bone lesion in the left T12 facet. Other: Mild retroareolar fibroglandular tissue as can be seen with mild gynecomastia. CT ABDOMEN PELVIS FINDINGS Hepatobiliary: Normal liver.  Normal gallbladder. Pancreas: Normal. Spleen: Normal. Adrenals/Urinary Tract: No evidence of urolithiasis or hydronephrosis. No definite mass visualized on this un-enhanced exam. Stomach/Bowel: No evidence of obstruction, inflammatory process, or abnormal fluid collections. Normal appendix. Vascular/Lymphatic: No pathologically enlarged lymph nodes. No evidence of abdominal aortic aneurysm. Reproductive:  No mass or other significant abnormality. Other: None. Musculoskeletal: There is a possible left L1 vertebral body lesion extending into the pedicle. There is a right L3 vertebral body lesion extending into the pedicle and facet. There is a small bone lesion in the right superior acetabulum. There is  a bone lesion in the left anteromedial acetabulum. There is a small bone lesion in the left ilium. IMPRESSION: 1. Multiple lytic lesions involving the thoracolumbar spine and pelvis as can be seen with multiple myeloma versus metastatic disease. Destructive bone lesion in the T6 vertebral body with impression on the thecal sac and approximately 15% height loss. Electronically Signed   By: Kathreen Devoid   On: 06/24/2015 13:12   Mr Thoracic Spine W Wo Contrast  06/24/2015  ADDENDUM REPORT: 06/24/2015 07:56 ADDENDUM: Upon further review, there is a probable additional discrete lesion within the right posterior aspect of the T1 vertebral body measuring approximately 15 mm (series 3, image 7). Additional lesion within the right superior aspect of the T11 vertebral body measures approximately 17 mm (series 3, image 7). Probable tiny 7 mm lesion within the posterior aspect of T8 (series 3, image 8). No associated pathologic fracture or extra osseous extension of tumor with these additional lesions. In fact, heterogeneous marrow signal intensity seen on axial merge sequences is highly concerning for diffuse osseous metastases even though these are not discretely delineated on STIR and post-contrast sequences. Probable involvement of the bilateral ribs noted. Benign hemangioma and with mild chronic height loss noted at T4. Electronically Signed   By: Jeannine Boga M.D.   On: 06/24/2015 07:56  06/24/2015  CLINICAL DATA:  Initial evaluation for acute back pain. EXAM: MRI THORACIC SPINE WITHOUT AND WITH CONTRAST TECHNIQUE: Multiplanar and multiecho pulse sequences of the thoracic spine were obtained without and with intravenous contrast. CONTRAST:  54m MULTIHANCE GADOBENATE DIMEGLUMINE 529 MG/ML IV SOLN COMPARISON:  None available. FINDINGS: Vertebral bodies are normally aligned with preservation of the normal thoracic kyphosis. No listhesis or malalignment. There is abnormal height loss with heterogeneity and  involving the T6 vertebral body. Abnormal posterior convex bowing of the posterior vertebral body contour more with abnormal soft tissue density within the ventral epidural space. This enhances mildly on post gadolinium sequence. Finding suspicious for possible osseous metastasis with associated pathologic fracture. There is associated 30% height loss. Abnormal soft tissue density within the ventral epidural space abuts the thoracic spinal cord and results in moderate to severe canal stenosis. Thecal sac measures approximately 8-9 mm in AP diameter at this level. Probable extension of tumor and to the posterior elements of T6, most evident within the right pedicle and lamina. Extension of tumor into the T5-6 neural foramina, particularly on the right (series 3, image 7). There is suggestion of possible additional ovoid lesion within the T9 vertebral body (series 3, image 11). This measures approximately 15 mm and spans the entirety of the vertebral body. No other definite osseous lesions identified. Possible hemangioma noted within the T4 vertebral body. Question of faint edema/myelomalacia within the thoracic spinal cord at the level of T6 related to compression (Series 3, image 10). Signal intensity within the thoracic spinal cord is otherwise normal. Paraspinous soft tissues demonstrate no other acute abnormality. Partially visualized lungs are clear. No significant degenerative changes within the thoracic spine. No focal disc herniation. No other significant stenosis. IMPRESSION: 1. Abnormal appearance of the T6 vertebral body, highly suspicious for possible osseous metastasis. Associated pathologic fracture with up to 30% height loss. There  is associated abnormal soft tissue density within the ventral epidural space, concerning for epidural tumor. Associated fairly severe canal stenosis. Question mild edema within the thoracic spinal cord related to compression at this level. 2. Question additional lesion  involving the T9 vertebral body as above. No other definite focal lesions within the thoracic spine. Electronically Signed: By: Jeannine Boga M.D. On: 06/24/2015 03:41   Mr Lumbar Spine W Wo Contrast  06/24/2015  CLINICAL DATA:  Initial evaluation for acute back pain. EXAM: MRI LUMBAR SPINE WITHOUT AND WITH CONTRAST TECHNIQUE: Multiplanar and multiecho pulse sequences of the lumbar spine were obtained without and with intravenous contrast. CONTRAST:  20 cc of MultiHance. COMPARISON:  Prior study from 06/18/2011. FINDINGS: Vertebral bodies are normally aligned with preservation of the normal lumbar lordosis. Vertebral body heights are well preserved. No fracture or malalignment. There is abnormal STIR signal intensity with enhancement involving the right pedicle of L3 (series 3, image 4), concerning for possible focal osseous lesion. This is not seen on previous MRI from 2013. Possible additional lesion within the right iliac wing adjacent to the SI joint (series 4, image 34). No other focal osseous lesions. Signal intensity within the vertebral body bone marrow otherwise within normal limits. Vertebral body heights are preserved. No acute or chronic fracture. Degenerative endplate Schmorl's node present at the superior endplate of L5. Conus medullaris terminates at the L2 level. Signal intensity within the visualized cord is normal. Nerve roots of the cauda equina within normal limits. Paraspinous soft tissues demonstrate no acute abnormality. L1-2: No significant disc bulge or focal disc herniation. No stenosis. L2-3:  No disc herniation or significant disc bulge.  No stenosis. L3-4: No disc herniation are significant disc bulge. No significant stenosis. L4-5: Right subarticular disc protrusion encroaching upon the right lateral recess, impinging upon the transiting L5 nerve root (series 14, image 29). Moderate lateral recess stenosis with mild canal narrowing. Foramina remain widely patent. L5-S1:  Shallow central disc protrusion with associated annular fissure. No significant stenosis or neural impingement. Foramina remain patent. IMPRESSION: 1. Focal osseous lesion with abnormal enhancement involving the right pedicle of L3, suspicious for possible osseous metastasis given the findings in the thoracic spine. Question additional focal lesion within the right iliac wing as above. No pathologic fracture. 2. Right subarticular disc protrusion at L4-5, impinging upon the right L5 nerve root in the right lateral recess. 3. Shallow central disc protrusion at L5-S1 without stenosis or neural impingement. Electronically Signed   By: Jeannine Boga M.D.   On: 06/24/2015 03:53   Ct Abdomen Pelvis W Contrast  06/24/2015  CLINICAL DATA:  Chest tightness from the chest to the lower legs. Back pain. EXAM: CT CHEST, ABDOMEN AND PELVIS WITH CONTRAST TECHNIQUE: Multidetector CT imaging of the chest, abdomen and pelvis was performed following the standard protocol with IV contrast. CONTRAST:  100 mL 1 ISOVUE-300 IOPAMIDOL (ISOVUE-300) INJECTION 61% COMPARISON:  None. FINDINGS: CT CHEST FINDINGS Mediastinum/Lymph Nodes: No masses or pathologically enlarged lymph nodes identified on this un-enhanced exam. Normal heart size. No pericardial effusion. Normal thoracic aorta. Lungs/Pleura: No focal consolidation, pleural effusion or pneumothorax. Musculoskeletal: Destructive bone lesion in the T6 vertebral body with impression on the thecal sac and approximately 15% height loss. The bone lesion extends into pedicles bilaterally. There is a bone lesion in the T1 vertebral body. There is a left T4 vertebral body lesion extending into the left pedicle. There is a left T4 spinous process bone lesion. There is a small bone lesion  in the left T3 pedicle. There is a small bone lesion in the inferior T10 vertebral body. There is a lytic bone lesion in the T11 vertebral body and right T11 pedicle. There is a small bone lesion in the  left inferior T11 facet. There is a bone lesion in the left T12 facet. Other: Mild retroareolar fibroglandular tissue as can be seen with mild gynecomastia. CT ABDOMEN PELVIS FINDINGS Hepatobiliary: Normal liver.  Normal gallbladder. Pancreas: Normal. Spleen: Normal. Adrenals/Urinary Tract: No evidence of urolithiasis or hydronephrosis. No definite mass visualized on this un-enhanced exam. Stomach/Bowel: No evidence of obstruction, inflammatory process, or abnormal fluid collections. Normal appendix. Vascular/Lymphatic: No pathologically enlarged lymph nodes. No evidence of abdominal aortic aneurysm. Reproductive: No mass or other significant abnormality. Other: None. Musculoskeletal: There is a possible left L1 vertebral body lesion extending into the pedicle. There is a right L3 vertebral body lesion extending into the pedicle and facet. There is a small bone lesion in the right superior acetabulum. There is a bone lesion in the left anteromedial acetabulum. There is a small bone lesion in the left ilium. IMPRESSION: 1. Multiple lytic lesions involving the thoracolumbar spine and pelvis as can be seen with multiple myeloma versus metastatic disease. Destructive bone lesion in the T6 vertebral body with impression on the thecal sac and approximately 15% height loss. Electronically Signed   By: Kathreen Devoid   On: 06/24/2015 13:12   Dg C-arm 1-60 Min-no Report  06/25/2015  CLINICAL DATA: T6 Laminectomy C-ARM 1-60 MINUTES Fluoroscopy was utilized by the requesting physician.  No radiographic interpretation.    ASSESSMENT & PLAN:   Multiple myeloma not having achieved remission ([place]) I reinforced the importance of complete blood work, 24-hour urine study, skeletal survey, staging bone marrow aspirate and biopsy before we can proceed with treatment. The patient has no medical insurance and I will get him assistance to complete application for medical insurance. I will order sedated bone marrow biopsy on  Thursday. I will see him back next week to review test results with plan to start him on Velcade-based therapy on 07/31/2015. I will get him chemotherapy education class.  Anemia in neoplastic disease This is likely anemia of chronic disease. The patient denies recent history of bleeding such as epistaxis, hematuria or hematochezia. He is asymptomatic from the anemia. We will observe for now.  He does not require transfusion now.    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 He is relatively asymptomatic apart from some paresthesia which I suspect is related to minor cord compression at the time of diagnosis. He would benefit from possible radiation treatment The patient is overwhelmed with medical information I am recommending return evaluation next week for further discussion of plan of care     Orders Placed This Encounter  Procedures  . DG Bone Survey Met    Standing Status: Future     Number of Occurrences:      Standing Expiration Date: 09/16/2016    Order Specific Question:  Reason for Exam (SYMPTOM  OR DIAGNOSIS REQUIRED)    Answer:  staging myeloma    Order Specific Question:  Preferred imaging location?    Answer:  [facility]  . UPEP/UIFE/Light Chains/TP, 24-Hr Ur    Standing Status: Future     Number of Occurrences:      Standing Expiration Date: 08/21/2016    All questions were answered. The patient knows to call the  clinic with any problems, questions or concerns. I spent 55 minutes counseling the patient face to face. The total time spent in the appointment was 60 minutes and more than 50% was on counseling.     [facility], [place], MD 07/18/2015 4:26 PM

## 2015-07-18 NOTE — Telephone Encounter (Signed)
Gave pt cal & avs °

## 2015-07-18 NOTE — Assessment & Plan Note (Signed)
He has healed well from recent surgery. He is relatively asymptomatic apart from some paresthesia which I suspect is related to minor cord compression at the time of diagnosis. He would benefit from possible radiation treatment The patient is overwhelmed with medical information I am recommending return evaluation next week for further discussion of plan of care

## 2015-07-18 NOTE — Telephone Encounter (Signed)
cld & sspoke to pt and adv Dr Alvy Bimler wanted to know if patient could come in @3  instead of 3:15-pt agreeded

## 2015-07-20 ENCOUNTER — Other Ambulatory Visit: Payer: Self-pay

## 2015-07-20 ENCOUNTER — Encounter: Payer: Self-pay | Admitting: Hematology and Oncology

## 2015-07-20 ENCOUNTER — Encounter (HOSPITAL_COMMUNITY)
Admission: RE | Admit: 2015-07-20 | Discharge: 2015-07-20 | Disposition: A | Discharge status: Home / Self Care | Payer: Self-pay | Source: Ambulatory Visit | Attending: Hematology and Oncology | Admitting: Hematology and Oncology

## 2015-07-20 ENCOUNTER — Encounter (HOSPITAL_COMMUNITY): Payer: Self-pay

## 2015-07-20 ENCOUNTER — Telehealth: Payer: Self-pay | Admitting: *Deleted

## 2015-07-20 ENCOUNTER — Ambulatory Visit (HOSPITAL_COMMUNITY)
Admission: RE | Admit: 2015-07-20 | Discharge: 2015-07-20 | Disposition: A | Discharge status: Home / Self Care | Payer: No Typology Code available for payment source | Source: Ambulatory Visit | Attending: Hematology and Oncology | Admitting: Hematology and Oncology

## 2015-07-20 VITALS — BP 147/93 | HR 77 | Temp 98.1°F | Resp 22

## 2015-07-20 DIAGNOSIS — R938 Abnormal findings on diagnostic imaging of other specified body structures: Secondary | ICD-10-CM | POA: Insufficient documentation

## 2015-07-20 DIAGNOSIS — C9000 Multiple myeloma not having achieved remission: Secondary | ICD-10-CM

## 2015-07-20 DIAGNOSIS — D649 Anemia, unspecified: Secondary | ICD-10-CM | POA: Insufficient documentation

## 2015-07-20 LAB — CBC WITH DIFFERENTIAL/PLATELET
BASOS ABS: 0 10*3/uL (ref 0.0–0.1)
Basophils Relative: 0 %
Eosinophils Absolute: 0.1 10*3/uL (ref 0.0–0.7)
Eosinophils Relative: 2 %
HEMATOCRIT: 28.9 % — AB (ref 39.0–52.0)
HEMOGLOBIN: 10.1 g/dL — AB (ref 13.0–17.0)
LYMPHS PCT: 29 %
Lymphs Abs: 1.8 10*3/uL (ref 0.7–4.0)
MCH: 31.3 pg (ref 26.0–34.0)
MCHC: 34.9 g/dL (ref 30.0–36.0)
MCV: 89.5 fL (ref 78.0–100.0)
Monocytes Absolute: 0.5 10*3/uL (ref 0.1–1.0)
Monocytes Relative: 7 %
NEUTROS ABS: 3.8 10*3/uL (ref 1.7–7.7)
NEUTROS PCT: 62 %
Platelets: 162 10*3/uL (ref 150–400)
RBC: 3.23 MIL/uL — AB (ref 4.22–5.81)
RDW: 14.2 % (ref 11.5–15.5)
WBC: 6.2 10*3/uL (ref 4.0–10.5)

## 2015-07-20 LAB — COMPREHENSIVE METABOLIC PANEL
ALBUMIN: 4.4 g/dL (ref 3.5–5.0)
ALT: 21 U/L (ref 17–63)
AST: 22 U/L (ref 15–41)
Alkaline Phosphatase: 50 U/L (ref 38–126)
Anion gap: 6 (ref 5–15)
BILIRUBIN TOTAL: 1.5 mg/dL — AB (ref 0.3–1.2)
BUN: 23 mg/dL — AB (ref 6–20)
CHLORIDE: 110 mmol/L (ref 101–111)
CO2: 24 mmol/L (ref 22–32)
CREATININE: 1.64 mg/dL — AB (ref 0.61–1.24)
Calcium: 9.2 mg/dL (ref 8.9–10.3)
GFR calc Af Amer: 56 mL/min — ABNORMAL LOW (ref >60)
GFR, EST NON AFRICAN AMERICAN: 49 mL/min — AB (ref >60)
GLUCOSE: 105 mg/dL — AB (ref 65–99)
Potassium: 4 mmol/L (ref 3.5–5.1)
Sodium: 140 mmol/L (ref 135–145)
TOTAL PROTEIN: 6.6 g/dL (ref 6.5–8.1)

## 2015-07-20 LAB — BONE MARROW EXAM

## 2015-07-20 LAB — URIC ACID: URIC ACID, SERUM: 7.2 mg/dL (ref 4.4–7.6)

## 2015-07-20 MED ORDER — FENTANYL CITRATE (PF) 100 MCG/2ML IJ SOLN
INTRAMUSCULAR | Status: AC | PRN
  Administered 2015-07-20 (×2): 25 ug via INTRAVENOUS

## 2015-07-20 MED ORDER — MIDAZOLAM HCL 5 MG/5ML IJ SOLN
INTRAMUSCULAR | Status: AC | PRN
  Administered 2015-07-20 (×3): 1 mg via INTRAVENOUS
  Administered 2015-07-20: 2 mg via INTRAVENOUS

## 2015-07-20 MED ORDER — SODIUM CHLORIDE 0.9 % IV SOLN
250.0000 mL | INTRAVENOUS | Status: DC
  Administered 2015-07-20: 250 mL via INTRAVENOUS

## 2015-07-20 MED ORDER — MIDAZOLAM HCL 2 MG/2ML IJ SOLN
INTRAMUSCULAR | Status: AC | PRN
  Administered 2015-07-20: 1 mg via INTRAVENOUS

## 2015-07-20 MED ORDER — FENTANYL CITRATE (PF) 100 MCG/2ML IJ SOLN
100.0000 ug | Freq: Once | INTRAMUSCULAR | Status: DC
  Filled 2015-07-20: qty 2

## 2015-07-20 MED ORDER — MIDAZOLAM HCL 5 MG/ML IJ SOLN
10.0000 mg | Freq: Once | INTRAMUSCULAR | Status: DC
  Filled 2015-07-20: qty 2

## 2015-07-20 NOTE — Sedation Documentation (Addendum)
Patient rolled supine. Pressure was held manually to bx site while patient was prone.He was unable to roll over even w help due to sedation immediatley after procedure

## 2015-07-20 NOTE — Progress Notes (Signed)
Patient brought [employer] [employer] [facility] FAA and some supporting documents. Additional documentation needed. Will hold for 1 week pending other info, then send to business office to be processed. Income verification also given to Raquel for possible drug replacement for Velcade.

## 2015-07-20 NOTE — Sedation Documentation (Addendum)
BX site C, D, I

## 2015-07-20 NOTE — Sedation Documentation (Addendum)
Asst up to void. Still unsteady on feet. BX site remains C, D, I

## 2015-07-20 NOTE — Discharge Instructions (Signed)
Bone Marrow Aspiration and Bone Marrow Biopsy, Care After Refer to this sheet in the next few weeks. These instructions provide you with information about caring for yourself after your procedure. Your health care provider may also give you more specific instructions. Your treatment has been planned according to current medical practices, but problems sometimes occur. Call your health care provider if you have any problems or questions after your procedure. WHAT TO EXPECT AFTER THE PROCEDURE After your procedure, it is common to have:  Soreness or tenderness around the puncture site.  Bruising. HOME CARE INSTRUCTIONS  Take medicines only as directed by your health care provider.  Follow your health care provider's instructions about:  May showr tomorrow and remove dressing.  If bleeding occurs apply pressure x 15 minutes, if continues , call MD on call.  Check your puncture site every day for signs of infection. Watch for:  Redness, swelling, or pain.  Fluid, blood, or pus.  Return to your normal activities as directed by your health care provider.  Keep all follow-up visits as directed by your health care provider. This is important. SEEK MEDICAL CARE IF:  You have a fever.  You have uncontrollable bleeding.  You have redness, swelling, or pain at the site of your puncture.  You have fluid, blood, or pus coming from your puncture site.   This information is not intended to replace advice given to you by your health care provider. Make sure you discuss any questions you have with your health care provider.   Document Released: 08/03/2004 Document Revised: 05/31/2014 Document Reviewed: 01/05/2014 Elsevier Interactive Patient Education 2016 [place] Anesthesia, Adult, Care After Refer to this sheet in the next few weeks. These instructions provide you with information on caring for yourself after your procedure.  Your health care provider may also give you more specific instructions. Your treatment has been planned according to current medical practices, but problems sometimes occur. Call your health care provider if you have any problems or questions after your procedure. WHAT TO EXPECT AFTER THE PROCEDURE After the procedure, it is typical to experience:  Sleepiness.  Nausea and vomiting. HOME CARE INSTRUCTIONS  For the first 24 hours after general anesthesia:  Have a responsible person with you.  Do not drive a car. If you are alone, do not take public transportation.  Do not drink alcohol.  Do not take medicine that has not been prescribed by your health care provider.  Do not sign important papers or make important decisions.  You may resume a normal diet and activities as directed by your health care provider.  If you have questions or problems that seem related to general anesthesia, call the hospital and ask for the anesthetist or anesthesiologist on call. SEEK MEDICAL CARE IF:  You have nausea and vomiting that continue the day after anesthesia.  You develop a rash. SEEK IMMEDIATE MEDICAL CARE IF:   You have difficulty breathing.  You have chest pain.  You have any allergic problems.   This information is not intended to replace advice given to you by your health care provider. Make sure you discuss any questions you have with your health care provider.   Document Released: 04/22/2000 Document Revised: 02/04/2014 Document Reviewed:  05/15/2011 Elsevier Interactive Patient Education [employer].

## 2015-07-20 NOTE — Sedation Documentation (Addendum)
MD at bedside. 

## 2015-07-20 NOTE — Procedures (Signed)
Brief examination was performed. ENT: adequate airway clearance Heart: regular rate and rhythm.No Murmurs Lungs: clear to auscultation, no wheezes, normal respiratory effort  American Society of Anesthesiologists ASA scale 1  Mallampati Score of 2  Bone Marrow Biopsy and Aspiration Procedure Note   Informed consent was obtained and potential risks including bleeding, infection and pain were reviewed with the patient. I verified that the patient has been fasting since midnight.  The patient's name, date of birth, identification, consent and allergies were verified prior to the start of procedure and time out was performed.  A total of 6 mg of IV Versed and 50 mcg of IV fentanyl were given.  The right posterior iliac crest was chosen as the site of biopsy.  The skin was prepped with Betadine solution.   8 cc of 1% lidocaine was used to provide local anaesthesia.   10 cc of bone marrow aspirate was obtained followed by 1 inch biopsy.   The procedure was tolerated well and there were no complications.  The patient was stable at the end of the procedure.  Specimens sent for flow cytometry, cytogenetics and additional studies.  

## 2015-07-20 NOTE — Telephone Encounter (Signed)
Instructed pt to drink more water,  Informed him of Dr. Calton Dach message.  He verbalized understanding.

## 2015-07-20 NOTE — Sedation Documentation (Addendum)
Ambulated in room. Steady on feet w/o help/ Spoke w Joy in rad. OK to do Bone Survey now.  BX SITE REMAINS c, d, i

## 2015-07-20 NOTE — Telephone Encounter (Signed)
-----   Message from [name], MD sent at 07/20/2015 10:12 AM EDT ----- Regarding: drink more water Labs showed mild dehydration Please encourage more PO intake  ----- Message -----    From: Lab In [place]: 07/20/2015   8:06 AM      To: [name], MD

## 2015-07-21 LAB — BETA 2 MICROGLOBULIN, SERUM: BETA 2 MICROGLOBULIN: 3.1 mg/L — AB (ref 0.6–2.4)

## 2015-07-21 LAB — KAPPA/LAMBDA LIGHT CHAINS
KAPPA FREE LGHT CHN: 8.1 mg/L (ref 3.3–19.4)
KAPPA, LAMDA LIGHT CHAIN RATIO: 0.01 — AB (ref 0.26–1.65)
LAMDA FREE LIGHT CHAINS: 1152.2 mg/L — AB (ref 5.7–26.3)

## 2015-07-21 LAB — VITAMIN D 25 HYDROXY (VIT D DEFICIENCY, FRACTURES): Vit D, 25-Hydroxy: 17.7 ng/mL — ABNORMAL LOW (ref 30.0–100.0)

## 2015-07-24 ENCOUNTER — Encounter: Payer: Self-pay | Admitting: Hematology and Oncology

## 2015-07-24 NOTE — Progress Notes (Signed)
possible asst for velcade. I left for dr. Lanice Schwab to sign and the pateint will sign at 07/27/15 visit.

## 2015-07-25 ENCOUNTER — Encounter: Payer: Self-pay | Admitting: Hematology and Oncology

## 2015-07-25 LAB — UPEP/UIFE/LIGHT CHAINS/TP, 24-HR UR
% BETA, URINE: 3.8 %
ALBUMIN, U: 12.1 %
ALPHA 1 URINE: 0.7 %
ALPHA-2-GLOBULIN, U: 2.1 %
Free Kappa Lt Chains,Ur: 15.1 mg/L (ref 1.35–24.19)
Free Lambda Lt Chains,Ur: 7060 mg/L — ABNORMAL HIGH (ref 0.24–6.66)
GAMMA GLOBULIN URINE: 81.3 %
Kappa/Lambda Ratio,U: <0.01 — ABNORMAL LOW (ref 2.04–10.37)
M-SPIKE, %: 78.7 % — ABNORMAL HIGH
M-Spike, mg/24 hr: 1828 mg/24 hr — ABNORMAL HIGH
PROTEIN UR: 101 mg/dL
Prot,24hr calculated: 2323 mg/24 hr — ABNORMAL HIGH (ref 30–150)

## 2015-07-25 LAB — MULTIPLE MYELOMA PANEL, SERUM
ALBUMIN/GLOB SERPL: 1.8 — AB (ref 0.7–1.7)
Albumin SerPl Elph-Mcnc: 3.9 g/dL (ref 2.9–4.4)
Alpha 1: 0.2 g/dL (ref 0.0–0.4)
Alpha2 Glob SerPl Elph-Mcnc: 0.5 g/dL (ref 0.4–1.0)
B-Globulin SerPl Elph-Mcnc: 0.9 g/dL (ref 0.7–1.3)
Gamma Glob SerPl Elph-Mcnc: 0.6 g/dL (ref 0.4–1.8)
Globulin, Total: 2.2 g/dL (ref 2.2–3.9)
IGM, SERUM: 11 mg/dL — AB (ref 20–172)
IgA: 17 mg/dL — ABNORMAL LOW (ref 90–386)
IgG (Immunoglobin G), Serum: 511 mg/dL — ABNORMAL LOW (ref 700–1600)
M Protein SerPl Elph-Mcnc: 0.2 g/dL — ABNORMAL HIGH
TOTAL PROTEIN ELP: 6.1 g/dL (ref 6.0–8.5)

## 2015-07-25 NOTE — Progress Notes (Signed)
Patient came in on 07/24/15 to discuss [place] and wasn't able to get additional documentation. Advised patient I would send over to business office what I had and if any additional information is needed, they will send him correspondence. Placed application in interoffice mail. Patient verbalized understanding. Also advised patient income information was given to Raquel for drug replacement. She will need him to sign application on Thurs when he comes in.Patient has my card for any additional financial questions or concerns.

## 2015-07-27 ENCOUNTER — Encounter: Payer: Self-pay | Admitting: *Deleted

## 2015-07-27 ENCOUNTER — Encounter: Payer: Self-pay | Admitting: Hematology and Oncology

## 2015-07-27 ENCOUNTER — Other Ambulatory Visit: Payer: Self-pay

## 2015-07-27 ENCOUNTER — Ambulatory Visit (HOSPITAL_BASED_OUTPATIENT_CLINIC_OR_DEPARTMENT_OTHER): Payer: Self-pay | Admitting: Hematology and Oncology

## 2015-07-27 ENCOUNTER — Telehealth: Payer: Self-pay | Admitting: Hematology and Oncology

## 2015-07-27 VITALS — BP 122/80 | HR 69 | Temp 98.1°F | Resp 18 | Ht 75.0 in | Wt 220.4 lb

## 2015-07-27 DIAGNOSIS — G9520 Unspecified cord compression: Secondary | ICD-10-CM

## 2015-07-27 DIAGNOSIS — R7989 Other specified abnormal findings of blood chemistry: Secondary | ICD-10-CM

## 2015-07-27 DIAGNOSIS — C7951 Secondary malignant neoplasm of bone: Secondary | ICD-10-CM

## 2015-07-27 DIAGNOSIS — C9000 Multiple myeloma not having achieved remission: Secondary | ICD-10-CM

## 2015-07-27 DIAGNOSIS — D630 Anemia in neoplastic disease: Secondary | ICD-10-CM

## 2015-07-27 DIAGNOSIS — R748 Abnormal levels of other serum enzymes: Secondary | ICD-10-CM

## 2015-07-27 DIAGNOSIS — G9529 Other cord compression: Secondary | ICD-10-CM

## 2015-07-27 LAB — TISSUE HYBRIDIZATION (BONE MARROW)-NCBH

## 2015-07-27 LAB — CHROMOSOME ANALYSIS, BONE MARROW

## 2015-07-27 MED ORDER — HYDROCODONE-ACETAMINOPHEN 5-325 MG PO TABS
1.0000 | ORAL_TABLET | Freq: Four times a day (QID) | ORAL | Status: DC | PRN
Start: 2015-07-27 — End: 2015-09-11

## 2015-07-27 MED ORDER — ONDANSETRON HCL 8 MG PO TABS: 8.0000 mg | ORAL_TABLET | Freq: Three times a day (TID) | ORAL | Status: DC | PRN

## 2015-07-27 MED ORDER — VITAMIN D 50 MCG (2000 UT) PO TABS: 2000.0000 [IU] | ORAL_TABLET | Freq: Every day | ORAL | Status: DC

## 2015-07-27 MED ORDER — DEXAMETHASONE 4 MG PO TABS: ORAL_TABLET | ORAL | Status: DC

## 2015-07-27 MED ORDER — ACYCLOVIR 400 MG PO TABS: 400.0000 mg | ORAL_TABLET | Freq: Two times a day (BID) | ORAL | Status: DC

## 2015-07-27 MED ORDER — PROCHLORPERAZINE MALEATE 10 MG PO TABS: 10.0000 mg | ORAL_TABLET | Freq: Four times a day (QID) | ORAL | Status: DC | PRN

## 2015-07-27 MED FILL — HYDROCODON-APAP 5-325: 5-325 | 22 days supply | Qty: 90 | Fill #0

## 2015-07-27 MED FILL — ACYCLOVIR 400 MG TABLET: 400 | 30 days supply | Qty: 60 | Fill #0

## 2015-07-27 MED FILL — DEXAMETHASONE 4 MG TABLET: 4 | 28 days supply | Qty: 40 | Fill #0

## 2015-07-27 MED FILL — ONDANSETRON HCL 8 MG TABLET: 8 | 10 days supply | Qty: 30 | Fill #0

## 2015-07-27 NOTE — Progress Notes (Signed)
possible asst for velcade. I left for dr. Lanice Schwab to sign and the pateint will sign at 07/27/15 visit.-faxed [phone]

## 2015-07-27 NOTE — Telephone Encounter (Signed)
Gave patient appointment schedule for July including 7/12 appointment with Dr. Lisbeth Renshaw.

## 2015-07-28 DIAGNOSIS — R7989 Other specified abnormal findings of blood chemistry: Secondary | ICD-10-CM | POA: Insufficient documentation

## 2015-07-28 NOTE — Progress Notes (Signed)
[facility] OFFICE PROGRESS NOTE  Patient Care Team: [name], MD as PCP - General (Internal Medicine)  SUMMARY OF ONCOLOGIC HISTORY:   Multiple myeloma not having achieved remission ([place])   06/23/2015 - 06/28/2015 Hospital Admission The patient was admitted to the hospital due to gait ataxia and back pain. He was subsequently found to have cord compression underwent surgery and was discharged home   06/24/2015 Imaging Abnormal appearance of the T6 vertebral body, highly suspicious for possible osseous metastasis. Associated pathologic fracture withup to 30% height loss. There is associated abnormal soft tissue density within the ventral epidural space,   06/24/2015 Imaging MRI lumbar: Focal osseous lesion with abnormal enhancement involving the right pedicle of L3, suspicious for possible osseous metastasisgiven the findings in the thoracic spine. Question additional focal lesion within the right iliac wing as above.     06/25/2015 Pathology Results Accession: [id number] bone biopsy come from plasma cell neoplasm.   06/25/2015 Surgery He had T6 laminectomy, bilateral transpedicular approach for resection of tumor, decompression of thecal sac and microdissection   07/20/2015 Bone Marrow Biopsy BM biopsy showed 50% involvement    INTERVAL HISTORY: Please see below for problem oriented charting. He returns to review test results. He complained of bilateral lower extremity numbness. Denies weakness. He complained of persistent pain on his back. He has some mild reduced appetite. Denies nausea or constipation  REVIEW OF SYSTEMS:   Constitutional: Denies fevers, chills or abnormal weight loss Eyes: Denies blurriness of vision Ears, nose, mouth, throat, and face: Denies mucositis or sore throat Respiratory: Denies cough, dyspnea or wheezes Cardiovascular: Denies palpitation, chest discomfort or lower extremity swelling Gastrointestinal:  Denies nausea, heartburn or change in  bowel habits Skin: Denies abnormal skin rashes Lymphatics: Denies new lymphadenopathy or easy bruising Neurological:Denies numbness, tingling or new weaknesses Behavioral/Psych: Mood is stable, no new changes  All other systems were reviewed with the patient and are negative.  I have reviewed the past medical history, past surgical history, social history and family history with the patient and they are unchanged from previous note.  ALLERGIES:  has No Known Allergies.  MEDICATIONS:  Current Outpatient Prescriptions  Medication Sig Dispense Refill  . HYDROcodone-acetaminophen (NORCO/VICODIN) 5-325 MG tablet Take 1 tablet by mouth every 6 (six) hours as needed for moderate pain. 90 tablet 0  . ibuprofen (ADVIL,MOTRIN) 800 MG tablet Take 800 mg by mouth every 8 (eight) hours as needed.    . senna-docusate (SENOKOT-S) 8.6-50 MG tablet Take 1 tablet by mouth at bedtime as needed for mild constipation. 30 tablet 0  . acyclovir (ZOVIRAX) 400 MG tablet Take 1 tablet (400 mg total) by mouth 2 (two) times daily. 60 tablet 3  . Cholecalciferol (VITAMIN D) 2000 units tablet Take 1 tablet (2,000 Units total) by mouth daily. 30 tablet 11  . dexamethasone (DECADRON) 4 MG tablet Take 10 tablets (40 mg) every week on Mondays 40 tablet 3  . ondansetron (ZOFRAN) 8 MG tablet Take 1 tablet (8 mg total) by mouth every 8 (eight) hours as needed (Nausea or vomiting). 30 tablet 1  . prochlorperazine (COMPAZINE) 10 MG tablet Take 1 tablet (10 mg total) by mouth every 6 (six) hours as needed (Nausea or vomiting). 30 tablet 1   No current facility-administered medications for this visit.    PHYSICAL EXAMINATION: ECOG PERFORMANCE STATUS: 1 - Symptomatic but completely ambulatory  Filed Vitals:   07/27/15 1354  BP: 122/80  Pulse: 69  Temp: 98.1 F (36.7 C)  Resp: 18   Filed Weights   07/27/15 1354  Weight: 220 lb 6.4 oz (99.973 kg)    GENERAL:alert, no distress and comfortable SKIN: skin color,  texture, turgor are normal, no rashes or significant lesions EYES: normal, Conjunctiva are pink and non-injected, sclera clear OROPHARYNX:no exudate, no erythema and lips, buccal mucosa, and tongue normal  NECK: supple, thyroid normal size, non-tender, without nodularity LYMPH:  no palpable lymphadenopathy in the cervical, axillary or inguinal LUNGS: clear to auscultation and percussion with normal breathing effort HEART: regular rate & rhythm and no murmurs and no lower extremity edema ABDOMEN:abdomen soft, non-tender and normal bowel sounds Musculoskeletal:no cyanosis of digits and no clubbing  NEURO: alert & oriented x 3 with fluent speech, no focal motor/sensory deficits  LABORATORY DATA:  I have reviewed the data as listed    Component Value Date/Time   NA 140 07/20/2015 0755   K 4.0 07/20/2015 0755   CL 110 07/20/2015 0755   CO2 24 07/20/2015 0755   GLUCOSE 105* 07/20/2015 0755   BUN 23* 07/20/2015 0755   CREATININE 1.64* 07/20/2015 0755   CALCIUM 9.2 07/20/2015 0755   PROT 6.6 07/20/2015 0755   ALBUMIN 4.4 07/20/2015 0755   AST 22 07/20/2015 0755   ALT 21 07/20/2015 0755   ALKPHOS 50 07/20/2015 0755   BILITOT 1.5* 07/20/2015 0755   GFRNONAA 49* 07/20/2015 0755   GFRAA 56* 07/20/2015 0755    No results found for: SPEP, UPEP  Lab Results  Component Value Date   WBC 6.2 07/20/2015   NEUTROABS 3.8 07/20/2015   HGB 10.1* 07/20/2015   HCT 28.9* 07/20/2015   MCV 89.5 07/20/2015   PLT 162 07/20/2015      Chemistry      Component Value Date/Time   NA 140 07/20/2015 0755   K 4.0 07/20/2015 0755   CL 110 07/20/2015 0755   CO2 24 07/20/2015 0755   BUN 23* 07/20/2015 0755   CREATININE 1.64* 07/20/2015 0755      Component Value Date/Time   CALCIUM 9.2 07/20/2015 0755   ALKPHOS 50 07/20/2015 0755   AST 22 07/20/2015 0755   ALT 21 07/20/2015 0755   BILITOT 1.5* 07/20/2015 0755       RADIOGRAPHIC STUDIES:I reviewed the skeletal survey I have personally reviewed  the radiological images as listed and agreed with the findings in the report.    ASSESSMENT & PLAN:  Multiple myeloma not having achieved remission ([place]) This patient had limited educational level tostand the scope of his disease. He understand that the disease is noncurable and treatment is generally palliative. However, conventional standard chemotherapy have high response rates with high chances of inducing remission. He would be a prime candidate to undergo autologous bone marrow transplant after induction chemotherapy. I recommend palliative radiation therapy to recent surgical site to reduce the risk of local disease progression causing cord compression. The patient is poor with no insurance coverage. He does not know how to navigate the system and does not have a Education officer, museum to help him sign up for Medicaid. I am not able to give him triple therapy. He will not be able to get lenalidomide in his situation with no insurance coverage whatsoever. I will start him on weekly dexamethasone along with Velcade on days 1, 4, 8 and 11. I will cover him with acyclovir. He is instructed to take vitamin D supplement. I will get social worker to help him apply for disability and insurance coverage. I will see him back  prior to week to therapy for assessment of toxicity. Due to poor sulcal circumstances, he does not have a dentist to get dental clearance prior to the Zometa.  Anemia in neoplastic disease This is likely anemia of chronic disease. The patient denies recent history of bleeding such as epistaxis, hematuria or hematochezia. He is asymptomatic from the anemia. We will observe for now.  He does not require transfusion now.    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 Clinically, he is relatively asymptomatic. He has some altered sensation down his legs but nothing to suggest imminent  cord compression. He has no appreciable weakness in both lower extremities  Elevated serum creatinine He has recent elevated serum creatinine due to dehydration. I recommend increased oral fluid intake as tolerated.   Orders Placed This Encounter  Procedures  . CBC with Differential    Standing Status: Standing     Number of Occurrences: 20     Standing Expiration Date: 07/27/2016  . Comprehensive metabolic panel    Standing Status: Standing     Number of Occurrences: 20     Standing Expiration Date: 07/27/2016  . Ambulatory referral to Radiation Oncology    Referral Priority:  Routine    Referral Type:  Consultation    Referral Reason:  Specialty Services Required    Requested Specialty:  Radiation Oncology    Number of Visits Requested:  1   All questions were answered. The patient knows to call the clinic with any problems, questions or concerns. No barriers to learning was detected. I spent 25 minutes counseling the patient face to face. The total time spent in the appointment was 40 minutes and more than 50% was on counseling and review of test results     [facility], [place], MD 07/28/2015 7:47 AM

## 2015-07-28 NOTE — Assessment & Plan Note (Signed)
This is likely anemia of chronic disease. The patient denies recent history of bleeding such as epistaxis, hematuria or hematochezia. He is asymptomatic from the anemia. We will observe for now.  He does not require transfusion now.   

## 2015-07-28 NOTE — Assessment & Plan Note (Signed)
I will consult radiation oncology for palliative radiation therapy. Clinically, he is relatively asymptomatic. He has some altered sensation down his legs but nothing to suggest imminent cord compression. He has no appreciable weakness in both lower extremities

## 2015-07-28 NOTE — Assessment & Plan Note (Signed)
He has recent elevated serum creatinine due to dehydration. I recommend increased oral fluid intake as tolerated.

## 2015-07-28 NOTE — Assessment & Plan Note (Signed)
This patient had limited educational level tostand the scope of his disease. He understand that the disease is noncurable and treatment is generally palliative. However, conventional standard chemotherapy have high response rates with high chances of inducing remission. He would be a prime candidate to undergo autologous bone marrow transplant after induction chemotherapy. I recommend palliative radiation therapy to recent surgical site to reduce the risk of local disease progression causing cord compression. The patient is poor with no insurance coverage. He does not know how to navigate the system and does not have a Education officer, museum to help him sign up for Medicaid. I am not able to give him triple therapy. He will not be able to get lenalidomide in his situation with no insurance coverage whatsoever. I will start him on weekly dexamethasone along with Velcade on days 1, 4, 8 and 11. I will cover him with acyclovir. He is instructed to take vitamin D supplement. I will get social worker to help him apply for disability and insurance coverage. I will see him back prior to week to therapy for assessment of toxicity. Due to poor sulcal circumstances, he does not have a dentist to get dental clearance prior to the Zometa.

## 2015-07-31 ENCOUNTER — Encounter: Payer: Self-pay | Admitting: Hematology and Oncology

## 2015-07-31 ENCOUNTER — Ambulatory Visit (HOSPITAL_BASED_OUTPATIENT_CLINIC_OR_DEPARTMENT_OTHER): Payer: Medicaid Other

## 2015-07-31 VITALS — BP 138/95 | HR 79 | Temp 99.4°F | Resp 18

## 2015-07-31 DIAGNOSIS — Z5112 Encounter for antineoplastic immunotherapy: Secondary | ICD-10-CM

## 2015-07-31 DIAGNOSIS — C9000 Multiple myeloma not having achieved remission: Secondary | ICD-10-CM | POA: Diagnosis not present

## 2015-07-31 MED ORDER — PROCHLORPERAZINE MALEATE 10 MG PO TABS
ORAL_TABLET | ORAL | Status: AC
  Filled 2015-07-31: qty 1

## 2015-07-31 MED ORDER — PROCHLORPERAZINE MALEATE 10 MG PO TABS
10.0000 mg | ORAL_TABLET | Freq: Once | ORAL | Status: AC
  Administered 2015-07-31: 10 mg via ORAL

## 2015-07-31 MED ORDER — BORTEZOMIB CHEMO SQ INJECTION 3.5 MG (2.5MG/ML)
1.3000 mg/m2 | Freq: Once | INTRAMUSCULAR | Status: AC
  Administered 2015-07-31: 3 mg via SUBCUTANEOUS
  Filled 2015-07-31: qty 3

## 2015-07-31 NOTE — Progress Notes (Signed)
Per Dr Alvy Bimler, Mazie to treat with temp of 99.3

## 2015-07-31 NOTE — Progress Notes (Signed)
velcade  approved. left letter for dr. Alvy Hunt to fill out-sent to medical recds

## 2015-07-31 NOTE — Patient Instructions (Signed)
[facility] Discharge Instructions for Patients Receiving Chemotherapy  Today you received the following chemotherapy agents:  Velcade  To help prevent nausea and vomiting after your treatment, we encourage you to take your nausea medication as prescribed.   If you develop nausea and vomiting that is not controlled by your nausea medication, call the clinic.   BELOW ARE SYMPTOMS THAT SHOULD BE REPORTED IMMEDIATELY:  *FEVER GREATER THAN 100.5 F  *CHILLS WITH OR WITHOUT FEVER  NAUSEA AND VOMITING THAT IS NOT CONTROLLED WITH YOUR NAUSEA MEDICATION  *UNUSUAL SHORTNESS OF BREATH  *UNUSUAL BRUISING OR BLEEDING  TENDERNESS IN MOUTH AND THROAT WITH OR WITHOUT PRESENCE OF ULCERS  *URINARY PROBLEMS  *BOWEL PROBLEMS  UNUSUAL RASH Items with * indicate a potential emergency and should be followed up as soon as possible.  Feel free to call the clinic you have any questions or concerns. The clinic phone number is [phone].  Please show the CHEMO ALERT CARD at check-in to the Emergency Department and triage nurse.     Bortezomib injection What is this medicine? BORTEZOMIB (bor TEZ oh mib) is a medicine that targets proteins in cancer cells and stops the cancer cells from growing. It is used to treat multiple myeloma and mantle-cell lymphoma. This medicine may be used for other purposes; ask your health care provider or pharmacist if you have questions. What should I tell my health care provider before I take this medicine? They need to know if you have any of these conditions: -diabetes -heart disease -irregular heartbeat -liver disease -on hemodialysis -low blood counts, like low white blood cells, platelets, or hemoglobin -peripheral neuropathy -taking medicine for blood pressure -an unusual or allergic reaction to bortezomib, mannitol, boron, other medicines, foods, dyes, or preservatives -pregnant or trying to get pregnant -breast-feeding How should I  use this medicine? This medicine is for injection into a vein or for injection under the skin. It is given by a health care professional in a hospital or clinic setting. Talk to your pediatrician regarding the use of this medicine in children. Special care may be needed. Overdosage: If you think you have taken too much of this medicine contact a poison control center or emergency room at once. NOTE: This medicine is only for you. Do not share this medicine with others. What if I miss a dose? It is important not to miss your dose. Call your doctor or health care professional if you are unable to keep an appointment. What may interact with this medicine? This medicine may interact with the following medications: -ketoconazole -rifampin -ritonavir -St. John's Wort This list may not describe all possible interactions. Give your health care provider a list of all the medicines, herbs, non-prescription drugs, or dietary supplements you use. Also tell them if you smoke, drink alcohol, or use illegal drugs. Some items may interact with your medicine. What should I watch for while using this medicine? Visit your doctor for checks on your progress. This drug may make you feel generally unwell. This is not uncommon, as chemotherapy can affect healthy cells as well as cancer cells. Report any side effects. Continue your course of treatment even though you feel ill unless your doctor tells you to stop. You may get drowsy or dizzy. Do not drive, use machinery, or do anything that needs mental alertness until you know how this medicine affects you. Do not stand or sit up quickly, especially if you are an older patient. This reduces the risk of dizzy or   fainting spells. In some cases, you may be given additional medicines to help with side effects. Follow all directions for their use. Call your doctor or health care professional for advice if you get a fever, chills or sore throat, or other symptoms of a cold or  flu. Do not treat yourself. This drug decreases your body's ability to fight infections. Try to avoid being around people who are sick. This medicine may increase your risk to bruise or bleed. Call your doctor or health care professional if you notice any unusual bleeding. You may need blood work done while you are taking this medicine. In some patients, this medicine may cause a serious brain infection that may cause death. If you have any problems seeing, thinking, speaking, walking, or standing, tell your doctor right away. If you cannot reach your doctor, urgently seek other source of medical care. Do not become pregnant while taking this medicine. Women should inform their doctor if they wish to become pregnant or think they might be pregnant. There is a potential for serious side effects to an unborn child. Talk to your health care professional or pharmacist for more information. Do not breast-feed an infant while taking this medicine. Check with your doctor or health care professional if you get an attack of severe diarrhea, nausea and vomiting, or if you sweat a lot. The loss of too much body fluid can make it dangerous for you to take this medicine. What side effects may I notice from receiving this medicine? Side effects that you should report to your doctor or health care professional as soon as possible: -allergic reactions like skin rash, itching or hives, swelling of the face, lips, or tongue -breathing problems -changes in hearing -changes in vision -fast, irregular heartbeat -feeling faint or lightheaded, falls -pain, tingling, numbness in the hands or feet -right upper belly pain -seizures -swelling of the ankles, feet, hands -unusual bleeding or bruising -unusually weak or tired -vomiting -yellowing of the eyes or skin Side effects that usually do not require medical attention (report to your doctor or health care professional if they continue or are bothersome): -changes in  emotions or moods -constipation -diarrhea -loss of appetite -headache -irritation at site where injected -nausea This list may not describe all possible side effects. Call your doctor for medical advice about side effects. You may report side effects to FDA at 1-800-FDA-1088. Where should I keep my medicine? This drug is given in a hospital or clinic and will not be stored at home. NOTE: This sheet is a summary. It may not cover all possible information. If you have questions about this medicine, talk to your doctor, pharmacist, or health care provider.    2016, Elsevier/[employer] Standard. (2014-03-15 14:47:04)  

## 2015-08-02 ENCOUNTER — Encounter: Payer: Self-pay | Admitting: *Deleted

## 2015-08-03 ENCOUNTER — Ambulatory Visit (HOSPITAL_BASED_OUTPATIENT_CLINIC_OR_DEPARTMENT_OTHER): Payer: Medicaid Other

## 2015-08-03 VITALS — BP 126/81 | HR 81 | Temp 97.6°F | Resp 18

## 2015-08-03 DIAGNOSIS — Z5111 Encounter for antineoplastic chemotherapy: Secondary | ICD-10-CM | POA: Diagnosis not present

## 2015-08-03 DIAGNOSIS — C9000 Multiple myeloma not having achieved remission: Secondary | ICD-10-CM

## 2015-08-03 MED ORDER — BORTEZOMIB CHEMO SQ INJECTION 3.5 MG (2.5MG/ML)
1.3000 mg/m2 | Freq: Once | INTRAMUSCULAR | Status: AC
  Administered 2015-08-03: 3 mg via SUBCUTANEOUS
  Filled 2015-08-03: qty 3

## 2015-08-03 MED ORDER — PROCHLORPERAZINE MALEATE 10 MG PO TABS
ORAL_TABLET | ORAL | Status: AC
  Filled 2015-08-03: qty 1

## 2015-08-03 MED ORDER — PROCHLORPERAZINE MALEATE 10 MG PO TABS
10.0000 mg | ORAL_TABLET | Freq: Once | ORAL | Status: AC
  Administered 2015-08-03: 10 mg via ORAL

## 2015-08-03 NOTE — Patient Instructions (Signed)
Bortezomib injection What is this medicine? BORTEZOMIB (bor TEZ oh mib) is a medicine that targets proteins in cancer cells and stops the cancer cells from growing. It is used to treat multiple myeloma and mantle-cell lymphoma. This medicine may be used for other purposes; ask your health care provider or pharmacist if you have questions. What should I tell my health care provider before I take this medicine? They need to know if you have any of these conditions: -diabetes -heart disease -irregular heartbeat -liver disease -on hemodialysis -low blood counts, like low white blood cells, platelets, or hemoglobin -peripheral neuropathy -taking medicine for blood pressure -an unusual or allergic reaction to bortezomib, mannitol, boron, other medicines, foods, dyes, or preservatives -pregnant or trying to get pregnant -breast-feeding How should I use this medicine? This medicine is for injection into a vein or for injection under the skin. It is given by a health care professional in a hospital or clinic setting. Talk to your pediatrician regarding the use of this medicine in children. Special care may be needed. Overdosage: If you think you have taken too much of this medicine contact a poison control center or emergency room at once. NOTE: This medicine is only for you. Do not share this medicine with others. What if I miss a dose? It is important not to miss your dose. Call your doctor or health care professional if you are unable to keep an appointment. What may interact with this medicine? This medicine may interact with the following medications: -ketoconazole -rifampin -ritonavir -St. John's Wort This list may not describe all possible interactions. Give your health care provider a list of all the medicines, herbs, non-prescription drugs, or dietary supplements you use. Also tell them if you smoke, drink alcohol, or use illegal drugs. Some items may interact with your medicine. What  should I watch for while using this medicine? Visit your doctor for checks on your progress. This drug may make you feel generally unwell. This is not uncommon, as chemotherapy can affect healthy cells as well as cancer cells. Report any side effects. Continue your course of treatment even though you feel ill unless your doctor tells you to stop. You may get drowsy or dizzy. Do not drive, use machinery, or do anything that needs mental alertness until you know how this medicine affects you. Do not stand or sit up quickly, especially if you are an older patient. This reduces the risk of dizzy or fainting spells. In some cases, you may be given additional medicines to help with side effects. Follow all directions for their use. Call your doctor or health care professional for advice if you get a fever, chills or sore throat, or other symptoms of a cold or flu. Do not treat yourself. This drug decreases your body's ability to fight infections. Try to avoid being around people who are sick. This medicine may increase your risk to bruise or bleed. Call your doctor or health care professional if you notice any unusual bleeding. You may need blood work done while you are taking this medicine. In some patients, this medicine may cause a serious brain infection that may cause death. If you have any problems seeing, thinking, speaking, walking, or standing, tell your doctor right away. If you cannot reach your doctor, urgently seek other source of medical care. Do not become pregnant while taking this medicine. Women should inform their doctor if they wish to become pregnant or think they might be pregnant. There is a potential for serious  side effects to an unborn child. Talk to your health care professional or pharmacist for more information. Do not breast-feed an infant while taking this medicine. Check with your doctor or health care professional if you get an attack of severe diarrhea, nausea and vomiting, or if  you sweat a lot. The loss of too much body fluid can make it dangerous for you to take this medicine. What side effects may I notice from receiving this medicine? Side effects that you should report to your doctor or health care professional as soon as possible: -allergic reactions like skin rash, itching or hives, swelling of the face, lips, or tongue -breathing problems -changes in hearing -changes in vision -fast, irregular heartbeat -feeling faint or lightheaded, falls -pain, tingling, numbness in the hands or feet -right upper belly pain -seizures -swelling of the ankles, feet, hands -unusual bleeding or bruising -unusually weak or tired -vomiting -yellowing of the eyes or skin Side effects that usually do not require medical attention (report to your doctor or health care professional if they continue or are bothersome): -changes in emotions or moods -constipation -diarrhea -loss of appetite -headache -irritation at site where injected -nausea This list may not describe all possible side effects. Call your doctor for medical advice about side effects. You may report side effects to FDA at 1-800-FDA-1088. Where should I keep my medicine? This drug is given in a hospital or clinic and will not be stored at home. NOTE: This sheet is a summary. It may not cover all possible information. If you have questions about this medicine, talk to your doctor, pharmacist, or health care provider.    2016, Elsevier/Gold Standard. (2014-03-15 14:47:04)

## 2015-08-03 NOTE — Progress Notes (Signed)
Per Dr. Alvy Bimler OK for Velcade injection today with labs from 07/20/15 and CRT 1.64

## 2015-08-07 ENCOUNTER — Ambulatory Visit (HOSPITAL_BASED_OUTPATIENT_CLINIC_OR_DEPARTMENT_OTHER): Payer: Medicaid Other

## 2015-08-07 ENCOUNTER — Telehealth: Payer: Self-pay | Admitting: Hematology and Oncology

## 2015-08-07 ENCOUNTER — Encounter: Payer: Self-pay | Admitting: Hematology and Oncology

## 2015-08-07 ENCOUNTER — Ambulatory Visit (HOSPITAL_BASED_OUTPATIENT_CLINIC_OR_DEPARTMENT_OTHER): Payer: Medicaid Other | Admitting: Hematology and Oncology

## 2015-08-07 ENCOUNTER — Other Ambulatory Visit (HOSPITAL_BASED_OUTPATIENT_CLINIC_OR_DEPARTMENT_OTHER): Payer: Medicaid Other

## 2015-08-07 VITALS — BP 132/74 | HR 88 | Temp 98.4°F | Resp 18 | Wt 221.7 lb

## 2015-08-07 DIAGNOSIS — Z5112 Encounter for antineoplastic immunotherapy: Secondary | ICD-10-CM

## 2015-08-07 DIAGNOSIS — C9000 Multiple myeloma not having achieved remission: Secondary | ICD-10-CM | POA: Diagnosis not present

## 2015-08-07 DIAGNOSIS — D61810 Antineoplastic chemotherapy induced pancytopenia: Secondary | ICD-10-CM

## 2015-08-07 DIAGNOSIS — T451X5A Adverse effect of antineoplastic and immunosuppressive drugs, initial encounter: Secondary | ICD-10-CM

## 2015-08-07 DIAGNOSIS — G9520 Unspecified cord compression: Secondary | ICD-10-CM | POA: Diagnosis not present

## 2015-08-07 DIAGNOSIS — C7951 Secondary malignant neoplasm of bone: Secondary | ICD-10-CM

## 2015-08-07 DIAGNOSIS — G9529 Other cord compression: Secondary | ICD-10-CM

## 2015-08-07 LAB — CBC WITH DIFFERENTIAL/PLATELET
BASO%: 0.3 % (ref 0.0–2.0)
Basophils Absolute: 0 10*3/uL (ref 0.0–0.1)
EOS ABS: 0 10*3/uL (ref 0.0–0.5)
EOS%: 0.3 % (ref 0.0–7.0)
HEMATOCRIT: 28.9 % — AB (ref 38.4–49.9)
HEMOGLOBIN: 10 g/dL — AB (ref 13.0–17.1)
LYMPH#: 1 10*3/uL (ref 0.9–3.3)
LYMPH%: 29.7 % (ref 14.0–49.0)
MCH: 30.7 pg (ref 27.2–33.4)
MCHC: 34.6 g/dL (ref 32.0–36.0)
MCV: 88.7 fL (ref 79.3–98.0)
MONO#: 0.1 10*3/uL (ref 0.1–0.9)
MONO%: 2.1 % (ref 0.0–14.0)
NEUT#: 2.2 10*3/uL (ref 1.5–6.5)
NEUT%: 67.6 % (ref 39.0–75.0)
PLATELETS: 289 10*3/uL (ref 140–400)
RBC: 3.26 10*6/uL — ABNORMAL LOW (ref 4.20–5.82)
RDW: 12.9 % (ref 11.0–14.6)
WBC: 3.3 10*3/uL — ABNORMAL LOW (ref 4.0–10.3)
nRBC: 0 % (ref 0–0)

## 2015-08-07 LAB — COMPREHENSIVE METABOLIC PANEL
ALBUMIN: 4.2 g/dL (ref 3.5–5.0)
ALK PHOS: 53 U/L (ref 40–150)
ALT: 15 U/L (ref 0–55)
ANION GAP: 9 meq/L (ref 3–11)
AST: 15 U/L (ref 5–34)
BUN: 11.6 mg/dL (ref 7.0–26.0)
CALCIUM: 9.9 mg/dL (ref 8.4–10.4)
CHLORIDE: 107 meq/L (ref 98–109)
CO2: 26 mEq/L (ref 22–29)
CREATININE: 1.2 mg/dL (ref 0.7–1.3)
EGFR: 74 mL/min/{1.73_m2} — ABNORMAL LOW (ref >90)
Glucose: 105 mg/dl (ref 70–140)
POTASSIUM: 4.2 meq/L (ref 3.5–5.1)
Sodium: 142 mEq/L (ref 136–145)
Total Bilirubin: 0.55 mg/dL (ref 0.20–1.20)
Total Protein: 7.1 g/dL (ref 6.4–8.3)

## 2015-08-07 LAB — URIC ACID: URIC ACID, SERUM: 6.8 mg/dL (ref 2.6–7.4)

## 2015-08-07 MED ORDER — PROCHLORPERAZINE MALEATE 10 MG PO TABS
10.0000 mg | ORAL_TABLET | Freq: Once | ORAL | Status: AC
Start: 2015-08-07 — End: 2015-08-07
  Administered 2015-08-07: 10 mg via ORAL

## 2015-08-07 MED ORDER — PROCHLORPERAZINE MALEATE 10 MG PO TABS
ORAL_TABLET | ORAL | Status: AC
  Filled 2015-08-07: qty 1

## 2015-08-07 MED ORDER — BORTEZOMIB CHEMO SQ INJECTION 3.5 MG (2.5MG/ML)
1.3000 mg/m2 | Freq: Once | INTRAMUSCULAR | Status: AC
  Administered 2015-08-07: 3 mg via SUBCUTANEOUS
  Filled 2015-08-07: qty 3

## 2015-08-07 NOTE — Patient Instructions (Signed)
[facility] Discharge Instructions for Patients Receiving Chemotherapy  Today you received the following chemotherapy agents Velcade  To help prevent nausea and vomiting after your treatment, we encourage you to take your nausea medication    If you develop nausea and vomiting that is not controlled by your nausea medication, call the clinic.   BELOW ARE SYMPTOMS THAT SHOULD BE REPORTED IMMEDIATELY:  *FEVER GREATER THAN 100.5 F  *CHILLS WITH OR WITHOUT FEVER  NAUSEA AND VOMITING THAT IS NOT CONTROLLED WITH YOUR NAUSEA MEDICATION  *UNUSUAL SHORTNESS OF BREATH  *UNUSUAL BRUISING OR BLEEDING  TENDERNESS IN MOUTH AND THROAT WITH OR WITHOUT PRESENCE OF ULCERS  *URINARY PROBLEMS  *BOWEL PROBLEMS  UNUSUAL RASH Items with * indicate a potential emergency and should be followed up as soon as possible.  Feel free to call the clinic you have any questions or concerns. The clinic phone number is [phone].  Please show the CHEMO ALERT CARD at check-in to the Emergency Department and triage nurse.   

## 2015-08-07 NOTE — Assessment & Plan Note (Signed)
This patient had limited educational level tostand the scope of his disease. He understand that the disease is noncurable and treatment is generally palliative. However, conventional standard chemotherapy have high response rates with high chances of inducing remission. He would be a prime candidate to undergo autologous bone marrow transplant after induction chemotherapy. I recommend palliative radiation therapy to recent surgical site to reduce the risk of local disease progression causing cord compression. The patient is poor with no insurance coverage. He does not know how to navigate the system and does not have a Education officer, museum to help him sign up for Medicaid. I am not able to give him triple therapy. He will not be able to get lenalidomide in his situation with no insurance coverage whatsoever. He was started on weekly pulsed dose dexamethasone 40 mg every Mondays along with Velcade on days 1, 4, 8 and 11. He is on acyclovir for antimicrobial prophylaxis He is instructed to take vitamin D supplement. I will get social worker to help him apply for disability and insurance coverage. I will see him back in 2 weeks for assessment of toxicity. Due to poor social circumstances, he does not have a dentist to get dental clearance prior to the Zometa. So far, he tolerated treatment well without any side effects. He has appointment to see radiation oncologist to discuss radiation treatment

## 2015-08-07 NOTE — Telephone Encounter (Signed)
Gave patient avs report and appointments for July and August. Dates per 7/10 pof.

## 2015-08-07 NOTE — Progress Notes (Signed)
[facility] OFFICE PROGRESS NOTE  Patient Care Team: [name], MD as PCP - General (Internal Medicine)  SUMMARY OF ONCOLOGIC HISTORY:   Multiple myeloma not having achieved remission ([place])   06/23/2015 - 06/28/2015 Hospital Admission The patient was admitted to the hospital due to gait ataxia and back pain. He was subsequently found to have cord compression underwent surgery and was discharged home   06/24/2015 Imaging Abnormal appearance of the T6 vertebral body, highly suspicious for possible osseous metastasis. Associated pathologic fracture withup to 30% height loss. There is associated abnormal soft tissue density within the ventral epidural space,   06/24/2015 Imaging MRI lumbar: Focal osseous lesion with abnormal enhancement involving the right pedicle of L3, suspicious for possible osseous metastasisgiven the findings in the thoracic spine. Question additional focal lesion within the right iliac wing as above.     06/25/2015 Pathology Results Accession: [id number] bone biopsy come from plasma cell neoplasm.   06/25/2015 Surgery He had T6 laminectomy, bilateral transpedicular approach for resection of tumor, decompression of thecal sac and microdissection   07/20/2015 Bone Marrow Biopsy BM biopsy showed 50% involvement; Cytogenetics 46XY, positive for 13q-   07/31/2015 -  Chemotherapy He started Velcade and Dex    INTERVAL HISTORY: Please see below for problem oriented charting. He is seen prior to week 2 of treatment. His back pain is under control. He had mild nausea after chemotherapy last week but no vomiting. Denies constipation Denies new focal neurological deficit He has minor skin irritation but no rash after recent treatment He complained of sensation of tightness in his lower extremities Denies recent incontinence The patient denies any recent signs or symptoms of bleeding such as spontaneous epistaxis, hematuria or hematochezia. No recent  infection  REVIEW OF SYSTEMS:   Constitutional: Denies fevers, chills or abnormal weight loss Eyes: Denies blurriness of vision Ears, nose, mouth, throat, and face: Denies mucositis or sore throat Respiratory: Denies cough, dyspnea or wheezes Cardiovascular: Denies palpitation, chest discomfort or lower extremity swelling Skin: Denies abnormal skin rashes Lymphatics: Denies new lymphadenopathy or easy bruising Behavioral/Psych: Mood is stable, no new changes  All other systems were reviewed with the patient and are negative.  I have reviewed the past medical history, past surgical history, social history and family history with the patient and they are unchanged from previous note.  ALLERGIES:  has No Known Allergies.  MEDICATIONS:  Current Outpatient Prescriptions  Medication Sig Dispense Refill  . acyclovir (ZOVIRAX) 400 MG tablet Take 1 tablet (400 mg total) by mouth 2 (two) times daily. 60 tablet 3  . Cholecalciferol (VITAMIN D) 2000 units tablet Take 1 tablet (2,000 Units total) by mouth daily. 30 tablet 11  . dexamethasone (DECADRON) 4 MG tablet Take 10 tablets (40 mg) every week on Mondays 40 tablet 3  . HYDROcodone-acetaminophen (NORCO/VICODIN) 5-325 MG tablet Take 1 tablet by mouth every 6 (six) hours as needed for moderate pain. 90 tablet 0  . ondansetron (ZOFRAN) 8 MG tablet Take 1 tablet (8 mg total) by mouth every 8 (eight) hours as needed (Nausea or vomiting). 30 tablet 1  . senna-docusate (SENOKOT-S) 8.6-50 MG tablet Take 1 tablet by mouth at bedtime as needed for mild constipation. 30 tablet 0  . prochlorperazine (COMPAZINE) 10 MG tablet Take 1 tablet (10 mg total) by mouth every 6 (six) hours as needed (Nausea or vomiting). (Patient not taking: Reported on 08/07/2015) 30 tablet 1   No current facility-administered medications for this visit.   Facility-Administered  Medications Ordered in Other Visits  Medication Dose Route Frequency Provider Last Rate Last Dose  .  bortezomib SQ (VELCADE) chemo injection 3 mg  1.3 mg/m2 (Treatment Plan Actual) Subcutaneous Once [name], MD      . prochlorperazine (COMPAZINE) tablet 10 mg  10 mg Oral Once [name], MD        PHYSICAL EXAMINATION: ECOG PERFORMANCE STATUS: 1 - Symptomatic but completely ambulatory  Filed Vitals:   08/07/15 1227  BP: 132/74  Pulse: 88  Temp: 98.4 F (36.9 C)  Resp: 18   Filed Weights   08/07/15 1227  Weight: 221 lb 11.2 oz (100.562 kg)    GENERAL:alert, no distress and comfortable SKIN: skin color, texture, turgor are normal, no rashes or significant lesions EYES: normal, Conjunctiva are pink and non-injected, sclera clear OROPHARYNX:no exudate, no erythema and lips, buccal mucosa, and tongue normal  NECK: supple, thyroid normal size, non-tender, without nodularity LYMPH:  no palpable lymphadenopathy in the cervical, axillary or inguinal LUNGS: clear to auscultation and percussion with normal breathing effort HEART: regular rate & rhythm and no murmurs and no lower extremity edema ABDOMEN:abdomen soft, non-tender and normal bowel sounds Musculoskeletal:no cyanosis of digits and no clubbing  NEURO: alert & oriented x 3 with fluent speech, no focal motor/sensory deficits  LABORATORY DATA:  I have reviewed the data as listed    Component Value Date/Time   NA 142 08/07/2015 1212   NA 140 07/20/2015 0755   K 4.2 08/07/2015 1212   K 4.0 07/20/2015 0755   CL 110 07/20/2015 0755   CO2 26 08/07/2015 1212   CO2 24 07/20/2015 0755   GLUCOSE 105 08/07/2015 1212   GLUCOSE 105* 07/20/2015 0755   BUN 11.6 08/07/2015 1212   BUN 23* 07/20/2015 0755   CREATININE 1.2 08/07/2015 1212   CREATININE 1.64* 07/20/2015 0755   CALCIUM 9.9 08/07/2015 1212   CALCIUM 9.2 07/20/2015 0755   PROT 7.1 08/07/2015 1212   PROT 6.6 07/20/2015 0755   ALBUMIN 4.2 08/07/2015 1212   ALBUMIN 4.4 07/20/2015 0755   AST 15 08/07/2015 1212   AST 22 07/20/2015 0755   ALT 15 08/07/2015 1212   ALT 21  07/20/2015 0755   ALKPHOS 53 08/07/2015 1212   ALKPHOS 50 07/20/2015 0755   BILITOT 0.55 08/07/2015 1212   BILITOT 1.5* 07/20/2015 0755   GFRNONAA 49* 07/20/2015 0755   GFRAA 56* 07/20/2015 0755    No results found for: SPEP, UPEP  Lab Results  Component Value Date   WBC 3.3* 08/07/2015   NEUTROABS 2.2 08/07/2015   HGB 10.0* 08/07/2015   HCT 28.9* 08/07/2015   MCV 88.7 08/07/2015   PLT 289 08/07/2015      Chemistry      Component Value Date/Time   NA 142 08/07/2015 1212   NA 140 07/20/2015 0755   K 4.2 08/07/2015 1212   K 4.0 07/20/2015 0755   CL 110 07/20/2015 0755   CO2 26 08/07/2015 1212   CO2 24 07/20/2015 0755   BUN 11.6 08/07/2015 1212   BUN 23* 07/20/2015 0755   CREATININE 1.2 08/07/2015 1212   CREATININE 1.64* 07/20/2015 0755      Component Value Date/Time   CALCIUM 9.9 08/07/2015 1212   CALCIUM 9.2 07/20/2015 0755   ALKPHOS 53 08/07/2015 1212   ALKPHOS 50 07/20/2015 0755   AST 15 08/07/2015 1212   AST 22 07/20/2015 0755   ALT 15 08/07/2015 1212   ALT 21 07/20/2015 0755   BILITOT 0.55  08/07/2015 1212   BILITOT 1.5* 07/20/2015 0755     ASSESSMENT & PLAN:  Multiple myeloma not having achieved remission ([place]) This patient had limited educational level tostand the scope of his disease. He understand that the disease is noncurable and treatment is generally palliative. However, conventional standard chemotherapy have high response rates with high chances of inducing remission. He would be a prime candidate to undergo autologous bone marrow transplant after induction chemotherapy. I recommend palliative radiation therapy to recent surgical site to reduce the risk of local disease progression causing cord compression. The patient is poor with no insurance coverage. He does not know how to navigate the system and does not have a Education officer, museum to help him sign up for Medicaid. I am not able to give him triple therapy. He will not be able to get lenalidomide in  his situation with no insurance coverage whatsoever. He was started on weekly pulsed dose dexamethasone 40 mg every Mondays along with Velcade on days 1, 4, 8 and 11. He is on acyclovir for antimicrobial prophylaxis He is instructed to take vitamin D supplement. I will get social worker to help him apply for disability and insurance coverage. I will see him back in 2 weeks for assessment of toxicity. Due to poor social circumstances, he does not have a dentist to get dental clearance prior to the Zometa. So far, he tolerated treatment well without any side effects. He has appointment to see radiation oncologist to discuss radiation treatment  Pancytopenia due to antineoplastic chemotherapy [facility]) This is likely due to recent treatment. The patient denies recent history of bleeding such as epistaxis, hematuria or hematochezia. He is asymptomatic from the low platelet count. I will observe for now.  I will continue the chemotherapy at current dose without dosage adjustment.  If the thrombocytopenia gets progressive worse in the future, I might have to delay his treatment or adjust the chemotherapy dose.    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 Clinically, he is relatively asymptomatic apart from sensation of tightness. He has some altered sensation down his legs but nothing to suggest imminent cord compression. He has no appreciable weakness in both lower extremities     No orders of the defined types were placed in this encounter.   All questions were answered. The patient knows to call the clinic with any problems, questions or concerns. No barriers to learning was detected. I spent 20 minutes counseling the patient face to face. The total time spent in the appointment was 25 minutes and more than 50% was on counseling and review of test results     [facility], Malika Demario, MD 08/07/2015 1:40  PM

## 2015-08-07 NOTE — Assessment & Plan Note (Signed)
This is likely due to recent treatment. The patient denies recent history of bleeding such as epistaxis, hematuria or hematochezia. He is asymptomatic from the low platelet count. I will observe for now.  I will continue the chemotherapy at current dose without dosage adjustment.  If the thrombocytopenia gets progressive worse in the future, I might have to delay his treatment or adjust the chemotherapy dose.

## 2015-08-07 NOTE — Assessment & Plan Note (Signed)
I have consulted radiation oncology for palliative radiation therapy. Clinically, he is relatively asymptomatic apart from sensation of tightness. He has some altered sensation down his legs but nothing to suggest imminent cord compression. He has no appreciable weakness in both lower extremities

## 2015-08-07 NOTE — Progress Notes (Signed)
Met with patient whom had financial concern regarding Revlimid and not having insurance. Reviewed billing notes and saw patient's application for CAFA was put on hold due to it possibly being an accident related injury. Advised patient to meet back with me on Wednesday when he comes in while I do research and see how I may be able to assist. Applied online for assistance through [place] on patient's behalf. Awaiting response from them to see if he is eligible for free medication. Patient has my card for any additional questions or concerns.

## 2015-08-08 LAB — VITAMIN D 25 HYDROXY (VIT D DEFICIENCY, FRACTURES): Vitamin D, 25-Hydroxy: 25.4 ng/mL — ABNORMAL LOW (ref 30.0–100.0)

## 2015-08-08 LAB — BETA 2 MICROGLOBULIN, SERUM: BETA 2: 2.4 mg/L (ref 0.6–2.4)

## 2015-08-08 LAB — KAPPA/LAMBDA LIGHT CHAINS
IG KAPPA FREE LIGHT CHAIN: 9 mg/L (ref 3.3–19.4)
IG LAMBDA FREE LIGHT CHAIN: 440.8 mg/L — AB (ref 5.7–26.3)
Kappa/Lambda FluidC Ratio: 0.02 — ABNORMAL LOW (ref 0.26–1.65)

## 2015-08-08 NOTE — Progress Notes (Signed)
Histology and Location of Primary Cancer:  Multiple myeloma with Bone  L3  Sites of Visceral and Bony Metastatic Disease: L3  Location(s) of Symptomatic Metastases: tightness altered sensations down his legs, cord compression  Past/Anticipated chemotherapy by medical oncology, if any:  08/07/15:Dr. Gorsuch,MD referral for palliative radiation, started dexamethasone  As well as Velcade ,f/u 2 weeks   PROCEDURE: 06/25/15: 1. T6 laminectomy, bilateral transpedicular approach for resection of tumor, decompression of thecal sac 2. Use of microscope for microdissection  SURGEON: Dr. Consuella Lose, MD  If Spine Met(s), symptoms, if any, include:  Bowel/Bladder retention or incontinence    Numbness or weakness in extremities lower extremity's  weakness  Current Decadron regimen, if applicable: 40 mg (10 tablets) every Monday                                                  Pain on a scale of 0-10 FE:ETOL pain  Mid 4/10   At times, scar on back well healed from surgery Ambulatory status? Walker? Wheelchair?: ambulatory   SAFETY ISSUES:  Prior radiation? No  Pacemaker/ICD? NO  Is the patient on methotrexate? 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.- SEE COMMENT.PERIPHERAL BLOOD:- NORMOCYTIC-NORMOCHROMIC ANEMIA  Diagnosis 06/25/15: Soft tissue mass, simple excision, Thoracic six tumor- PLASMA CELL NEOPLASM  Married, 3 children, no family hx cancer, former cigarette smoker, 1/2ppd x 8 years,quit 199, no smokeless tobacco use, no alcohol, no illicit drugs BP 915/50 mmHg  Pulse 84  Temp(Src) 98.3 F (36.8 C) (Oral)  Resp 16  Ht 6' 3"  (1.905 m)  Wt 225 lb 11.2 oz (102.377 kg)  BMI 28.21 kg/m2  SpO2 100%  Wt Readings from Last 3 Encounters:  08/09/15 225 lb 11.2 oz (102.377 kg)  08/07/15 221 lb 11.2 oz (100.562 kg)  07/27/15 220 lb 6.4 oz (99.973 kg)     Allergies:NKA

## 2015-08-09 ENCOUNTER — Encounter: Payer: Self-pay | Admitting: Radiation Oncology

## 2015-08-09 ENCOUNTER — Ambulatory Visit
Admission: RE | Admit: 2015-08-09 | Discharge: 2015-08-09 | Disposition: A | Discharge status: Home / Self Care | Payer: Self-pay | Source: Ambulatory Visit | Attending: Radiation Oncology | Admitting: Radiation Oncology

## 2015-08-09 ENCOUNTER — Encounter: Payer: Self-pay | Admitting: Hematology and Oncology

## 2015-08-09 VITALS — BP 130/73 | HR 84 | Temp 98.3°F | Resp 16 | Ht 75.0 in | Wt 225.7 lb

## 2015-08-09 DIAGNOSIS — C9000 Multiple myeloma not having achieved remission: Secondary | ICD-10-CM

## 2015-08-09 DIAGNOSIS — D492 Neoplasm of unspecified behavior of bone, soft tissue, and skin: Secondary | ICD-10-CM

## 2015-08-09 DIAGNOSIS — G9529 Other cord compression: Secondary | ICD-10-CM

## 2015-08-09 DIAGNOSIS — Z510 Encounter for antineoplastic radiation therapy: Secondary | ICD-10-CM | POA: Insufficient documentation

## 2015-08-09 DIAGNOSIS — C7951 Secondary malignant neoplasm of bone: Secondary | ICD-10-CM

## 2015-08-09 HISTORY — DX: Secondary malignant neoplasm of bone: C79.51

## 2015-08-09 HISTORY — DX: Multiple myeloma not having achieved remission: C90.00

## 2015-08-09 LAB — MULTIPLE MYELOMA PANEL, SERUM
ALBUMIN SERPL ELPH-MCNC: 3.9 g/dL (ref 2.9–4.4)
ALPHA2 GLOB SERPL ELPH-MCNC: 0.7 g/dL (ref 0.4–1.0)
Albumin/Glob SerPl: 1.6 (ref 0.7–1.7)
Alpha 1: 0.3 g/dL (ref 0.0–0.4)
B-GLOBULIN SERPL ELPH-MCNC: 1 g/dL (ref 0.7–1.3)
Gamma Glob SerPl Elph-Mcnc: 0.5 g/dL (ref 0.4–1.8)
Globulin, Total: 2.5 g/dL (ref 2.2–3.9)
IGG (IMMUNOGLOBIN G), SERUM: 560 mg/dL — AB (ref 700–1600)
IGM (IMMUNOGLOBIN M), SRM: 12 mg/dL — AB (ref 20–172)
IgA, Qn, Serum: 17 mg/dL — ABNORMAL LOW (ref 90–386)
M PROTEIN SERPL ELPH-MCNC: 0.2 g/dL — AB
TOTAL PROTEIN: 6.4 g/dL (ref 6.0–8.5)

## 2015-08-09 NOTE — Progress Notes (Signed)
Met with patient in my office to give him Medicaid application. Advised patient that a denial letter may be needed to apply for additional services such as free medication assistance through [place]. Advised him that even though application has already been submitted, this letter can be requested. Patient verbalized understanding and will bring application back to me tomorrow to fax. Also gave patient booklet on how to apply for disability. Advised patient that he may apply online or at the [place] office listed on the book and the phone number is also on the book. Patient was very appreciative and verbalized understanding.

## 2015-08-09 NOTE — Progress Notes (Signed)
Please see the Nurse Progress Note in the MD Initial Consult Encounter for this patient. 
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
## 2015-08-10 ENCOUNTER — Ambulatory Visit (HOSPITAL_BASED_OUTPATIENT_CLINIC_OR_DEPARTMENT_OTHER): Payer: Medicaid Other

## 2015-08-10 ENCOUNTER — Encounter: Payer: Self-pay | Admitting: Hematology and Oncology

## 2015-08-10 VITALS — BP 121/68 | HR 68 | Temp 98.6°F | Resp 17

## 2015-08-10 DIAGNOSIS — C9000 Multiple myeloma not having achieved remission: Secondary | ICD-10-CM | POA: Diagnosis not present

## 2015-08-10 DIAGNOSIS — Z5112 Encounter for antineoplastic immunotherapy: Secondary | ICD-10-CM

## 2015-08-10 MED ORDER — PROCHLORPERAZINE MALEATE 10 MG PO TABS
10.0000 mg | ORAL_TABLET | Freq: Once | ORAL | Status: AC
Start: 2015-08-10 — End: 2015-08-10
  Administered 2015-08-10: 10 mg via ORAL

## 2015-08-10 MED ORDER — BORTEZOMIB CHEMO SQ INJECTION 3.5 MG (2.5MG/ML)
1.3000 mg/m2 | Freq: Once | INTRAMUSCULAR | Status: AC
  Administered 2015-08-10: 3 mg via SUBCUTANEOUS
  Filled 2015-08-10: qty 3

## 2015-08-10 MED ORDER — PROCHLORPERAZINE MALEATE 10 MG PO TABS
ORAL_TABLET | ORAL | Status: AC
  Filled 2015-08-10: qty 1

## 2015-08-10 NOTE — Progress Notes (Signed)
Pt repots feeling congested and fatigue since yesterday. Dr. Alvy Bimler aware and pt educated to take an over the counter allergy medicine like claritin per Dr. Alvy Bimler, and to call clinic if symptoms worsen. Pt verbalizes understanding.

## 2015-08-10 NOTE — Progress Notes (Signed)
Received call from [employer] rep on yesterday inquiring about application submitted for drug replacement for patient. Answered questions and was told they would get back with me and patient with decision. Patient came in today to bring Medicaid application. Faxed to [place]. Fax received ok per confirmation sheet. Advised patient to be sure to comply with the [employer] rep should they contact him. Patient verbalized understanding and has my card for any additional financial questions or concerns.

## 2015-08-10 NOTE — Patient Instructions (Signed)
[facility] Discharge Instructions for Patients Receiving Chemotherapy  Today you received the following chemotherapy agents Velcade. To help prevent nausea and vomiting after your treatment, we encourage you to take your nausea medication as directed.  If you develop nausea and vomiting that is not controlled by your nausea medication, call the clinic.   BELOW ARE SYMPTOMS THAT SHOULD BE REPORTED IMMEDIATELY:  *FEVER GREATER THAN 100.5 F  *CHILLS WITH OR WITHOUT FEVER  NAUSEA AND VOMITING THAT IS NOT CONTROLLED WITH YOUR NAUSEA MEDICATION  *UNUSUAL SHORTNESS OF BREATH  *UNUSUAL BRUISING OR BLEEDING  TENDERNESS IN MOUTH AND THROAT WITH OR WITHOUT PRESENCE OF ULCERS  *URINARY PROBLEMS  *BOWEL PROBLEMS  UNUSUAL RASH Items with * indicate a potential emergency and should be followed up as soon as possible.  Feel free to call the clinic you have any questions or concerns. The clinic phone number is [phone].  Please show the CHEMO ALERT CARD at check-in to the Emergency Department and triage nurse.    

## 2015-08-11 ENCOUNTER — Telehealth: Payer: Self-pay | Admitting: *Deleted

## 2015-08-11 MED ORDER — LENALIDOMIDE 10 MG PO CAPS: 10.0000 mg | ORAL_CAPSULE | Freq: Every day | ORAL | Status: DC

## 2015-08-11 NOTE — Telephone Encounter (Signed)
Pt enrolled in [place] and Consented pt over the phone regarding risks of birth defects, reading him the Revlimid consent form.  Pt verbalized understanding and agreed to do surveys as requested.  Rx for Revlimid given to [place] in [place].

## 2015-08-14 ENCOUNTER — Telehealth: Payer: Self-pay | Admitting: Pharmacist

## 2015-08-14 ENCOUNTER — Encounter: Payer: Self-pay | Admitting: Hematology and Oncology

## 2015-08-14 ENCOUNTER — Ambulatory Visit
Admission: RE | Admit: 2015-08-14 | Discharge: 2015-08-14 | Disposition: A | Discharge status: Home / Self Care | Payer: Self-pay | Source: Ambulatory Visit | Attending: Radiation Oncology | Admitting: Radiation Oncology

## 2015-08-14 NOTE — Progress Notes (Signed)
Received email from [facility] to have RN enroll patient in [facility] on 7/14. Cameo completed and brought me RX. I emailed Debby back to confirm this has been complete. [facility] Fax received ok per confirmation sheet. Reported everything to Raquel and gave her rx and fax confirmation sheets. This will cover Revlimid for the patient.

## 2015-08-14 NOTE — Telephone Encounter (Signed)
Received Approval for Free Drug Notification for Revlimid on Oral Oncology office desk. This approval expires on 02/14/2016. Approval requested a prescription for Revlimid be faxed to the designated specialty pharmacy at [phone].   Upon receipt of the Revlimid Rx, the pharmacy will contact the patient for drug delivery. A copy of this approval letter was given to Raquel and Dr. Calton Dach RN. Shauna and Raquel have been handling this prescription and his financial case.  Raul Del, PharmD, BCPS, [facility] [phone]

## 2015-08-15 ENCOUNTER — Encounter: Payer: Self-pay | Admitting: Hematology and Oncology

## 2015-08-15 ENCOUNTER — Other Ambulatory Visit: Payer: Self-pay | Admitting: Hematology and Oncology

## 2015-08-15 ENCOUNTER — Ambulatory Visit
Admission: RE | Admit: 2015-08-15 | Discharge: 2015-08-15 | Disposition: A | Discharge status: Home / Self Care | Payer: Self-pay | Source: Ambulatory Visit | Attending: Radiation Oncology | Admitting: Radiation Oncology

## 2015-08-15 ENCOUNTER — Telehealth: Payer: Self-pay | Admitting: *Deleted

## 2015-08-15 DIAGNOSIS — C9000 Multiple myeloma not having achieved remission: Secondary | ICD-10-CM

## 2015-08-15 MED ORDER — LENALIDOMIDE 10 MG PO CAPS: ORAL_CAPSULE | ORAL | Status: DC

## 2015-08-15 NOTE — Progress Notes (Signed)
per [employer] revlimid approved for 6 months free  expires 02/14/16. I sent to medical recrds

## 2015-08-15 NOTE — Telephone Encounter (Signed)
Faxed patient medication list to [facility]

## 2015-08-15 NOTE — Telephone Encounter (Signed)
I printed and signed it for Tammi I signed one last week for Cameo, too He should start on day 1 of next cycle

## 2015-08-16 ENCOUNTER — Telehealth: Payer: Self-pay | Admitting: *Deleted

## 2015-08-16 ENCOUNTER — Encounter: Payer: Self-pay | Admitting: Pharmacist

## 2015-08-16 ENCOUNTER — Ambulatory Visit
Admission: RE | Admit: 2015-08-16 | Discharge: 2015-08-16 | Disposition: A | Discharge status: Home / Self Care | Payer: Self-pay | Source: Ambulatory Visit | Attending: Radiation Oncology | Admitting: Radiation Oncology

## 2015-08-16 NOTE — Telephone Encounter (Signed)
Pt came in with Revlimid. Was unsure when to start taking. Reviewed with pharmacist, Eliezer Lofts. Dr Alvy Bimler note indicates start with next cycle, which is July 24. Pt notified to start 7/24, take as directed. Pt verbalized understanding. [facility] reviewed all instructions with patient when they arranged delivery.

## 2015-08-16 NOTE — Progress Notes (Signed)
Sharlynn Oliphant, RN received shipment of Revlimid from [facility]. She provided drug to pt in the lobby today. Plan is to begin Revlimid ~ 08/21/15. Pt declined counseling from oral chemo navigator today. We will f/u at end of the month to determine compliance, tolerance, etc. Kennith Center, Pharm.D., CPP 08/16/2015@2 :[facility]

## 2015-08-17 ENCOUNTER — Ambulatory Visit
Admission: RE | Admit: 2015-08-17 | Discharge: 2015-08-17 | Disposition: A | Discharge status: Home / Self Care | Payer: No Typology Code available for payment source | Source: Ambulatory Visit | Attending: Radiation Oncology | Admitting: Radiation Oncology

## 2015-08-17 ENCOUNTER — Ambulatory Visit
Admission: RE | Admit: 2015-08-17 | Discharge: 2015-08-17 | Disposition: A | Discharge status: Home / Self Care | Payer: Self-pay | Source: Ambulatory Visit | Attending: Radiation Oncology | Admitting: Radiation Oncology

## 2015-08-17 ENCOUNTER — Encounter: Payer: Self-pay | Admitting: Radiation Oncology

## 2015-08-17 VITALS — BP 124/64 | HR 75 | Temp 98.4°F | Ht 75.0 in | Wt 222.4 lb

## 2015-08-17 DIAGNOSIS — C9000 Multiple myeloma not having achieved remission: Secondary | ICD-10-CM

## 2015-08-17 MED ORDER — DEXAMETHASONE 4 MG PO TABS: 4.0000 mg | ORAL_TABLET | Freq: Two times a day (BID) | ORAL | Status: DC

## 2015-08-17 MED FILL — DEXAMETHASONE 4 MG TABLET: 4 | 30 days supply | Qty: 60 | Fill #0

## 2015-08-17 NOTE — Progress Notes (Signed)
Department of Radiation Oncology  Phone:  [phone] Fax:        [phone]  Weekly Treatment Note    Name: Bradley Hunt Date: 08/17/2015 MRN: [id number] DOB: 09-15-1970   Diagnosis:     ICD-9-CM ICD-10-CM   1. Multiple myeloma not having achieved remission ([place]) 203.00 C90.00      Current dose: 10 Gy  Current fraction:4   MEDICATIONS: Current Outpatient Prescriptions  Medication Sig Dispense Refill  . acyclovir (ZOVIRAX) 400 MG tablet Take 1 tablet (400 mg total) by mouth 2 (two) times daily. 60 tablet 3  . Cholecalciferol (VITAMIN D) 2000 units tablet Take 1 tablet (2,000 Units total) by mouth daily. 30 tablet 11  . dexamethasone (DECADRON) 4 MG tablet Take 10 tablets (40 mg) every week on Mondays 40 tablet 3  . HYDROcodone-acetaminophen (NORCO/VICODIN) 5-325 MG tablet Take 1 tablet by mouth every 6 (six) hours as needed for moderate pain. 90 tablet 0  . ondansetron (ZOFRAN) 8 MG tablet Take 1 tablet (8 mg total) by mouth every 8 (eight) hours as needed (Nausea or vomiting). 30 tablet 1  . dexamethasone (DECADRON) 4 MG tablet Take 1 tablet (4 mg total) by mouth 2 (two) times daily. 60 tablet 1  . lenalidomide (REVLIMID) 10 MG capsule Take 1 capsule (10 mg total) by mouth daily. For 14 days on and then 7 days off each cycle. (Patient not taking: Reported on 08/17/2015) 14 capsule 0  . lenalidomide (REVLIMID) 10 MG capsule Take 1 capsule daily for 14 days, off 7 days (Patient not taking: Reported on 08/17/2015) 14 capsule 9  . prochlorperazine (COMPAZINE) 10 MG tablet Take 1 tablet (10 mg total) by mouth every 6 (six) hours as needed (Nausea or vomiting). (Patient not taking: Reported on 08/07/2015) 30 tablet 1  . senna-docusate (SENOKOT-S) 8.6-50 MG tablet Take 1 tablet by mouth at bedtime as needed for mild constipation. (Patient not taking: Reported on 08/09/2015) 30 tablet 0   No current facility-administered medications for this encounter.     ALLERGIES: Review of  patient's allergies indicates no known allergies.   LABORATORY DATA:  Lab Results  Component Value Date   WBC 3.3* 08/07/2015   HGB 10.0* 08/07/2015   HCT 28.9* 08/07/2015   MCV 88.7 08/07/2015   PLT 289 08/07/2015   Lab Results  Component Value Date   NA 142 08/07/2015   K 4.2 08/07/2015   CL 110 07/20/2015   CO2 26 08/07/2015   Lab Results  Component Value Date   ALT 15 08/07/2015   AST 15 08/07/2015   ALKPHOS 53 08/07/2015   BILITOT 0.55 08/07/2015     NARRATIVE: Bradley Hunt was seen today for A work in visit due to tightness in his abdominal region extending to the level of the nipple and down to his feet. The patient reports that he is been doing pretty well up until the last 2 days when he started noticing increasing abdominal tightness, and a feeling of heaviness of his lower extremities. He denies any difficulty with maintaining continence of bowel or bladder, and states that he continues to take 40 mg of dexamethasone on Mondays in anticipation of his velcade. He denies any shortness of breath or sternal chest pain. He denies any weakness in his upper extremities. He has not had any falls, blurred vision or double vision. No other complaints or verbalized.  PHYSICAL EXAMINATION: height is 6' 3"  (1.905 m) and weight is 222 lb 6.4 oz (100.88 kg). His oral temperature  is 98.4 F (36.9 C). His blood pressure is 124/64 and his pulse is 75. His oxygen saturation is 99%.     In general this is a well appearing African male in no acute distress. He's alert and oriented x4 and appropriate throughout the examination. Cardiopulmonary assessment is negative for acute distress and he exhibits normal effort. Light touch sensation is noted over the anterior thorax, upper extremities biliaterally, and lower abdomen, and lower extremities bilaterally. His walking is evaluated and without foot drop. He is walking much more carefully and slowly than last week.  ASSESSMENT: multiple myeloma  not having achieved remission with disease in the thoracic spine concerning for edema of the tumor along the spinal cord.  PLAN: I have reviewed his case with Dr. Tammi Klippel in Dr. Ida Rogue absence, and given his symptoms, Dr. Tammi Klippel has recommended proceeding with a scheduled course of steroids. We discussed the side effect profile of steroids and have recommended PPI prophylaxis. He states agreement. A new prescription for Dexamethasone 4 mg BID is provided, and I will contact Dr. Alvy Bimler to confirm her thoghts on the use of 40 mg he typically takes of Dexamethasone each Monday in leiu of his Velcade. He is given precautions to call if his symptoms acutely change. Otherwise he will proceed with treatment and come to be seen by Dr. Lisbeth Renshaw tomorrow for under treatment assessment.     Bradley Hunt, PAC

## 2015-08-17 NOTE — Progress Notes (Addendum)
Bradley Hunt has completed 4 fractions to his T spine.  He reports feeling a "tightness" that starts in his abdomen and is going all the way down his legs.  He said this started 2 days ago.  He said he has had it before but it not as bad.  He reports having nausea and takes Zofran prn.  He denies having problems urinating or with his bowels.  He had chemotherapy last week.  He reports having fatigue.  He denies having trouble swallowing or a sore throat.  He is taking decadron once a week.  BP 124/64 mmHg  Pulse 75  Temp(Src) 98.4 F (36.9 C) (Oral)  Ht 6\' 3"  (1.905 m)  Wt 222 lb 6.4 oz (100.88 kg)  BMI 27.80 kg/m2  SpO2 99%   Wt Readings from Last 3 Encounters:  08/17/15 222 lb 6.4 oz (100.88 kg)  08/09/15 225 lb 11.2 oz (102.377 kg)  08/07/15 221 lb 11.2 oz (100.562 kg)

## 2015-08-18 ENCOUNTER — Telehealth: Payer: Self-pay | Admitting: *Deleted

## 2015-08-18 ENCOUNTER — Encounter: Payer: Self-pay | Admitting: Radiation Oncology

## 2015-08-18 ENCOUNTER — Ambulatory Visit
Admission: RE | Admit: 2015-08-18 | Discharge: 2015-08-18 | Disposition: A | Discharge status: Home / Self Care | Payer: Self-pay | Source: Ambulatory Visit | Attending: Radiation Oncology | Admitting: Radiation Oncology

## 2015-08-18 ENCOUNTER — Ambulatory Visit
Admission: RE | Admit: 2015-08-18 | Discharge: 2015-08-18 | Disposition: A | Discharge status: Home / Self Care | Payer: No Typology Code available for payment source | Source: Ambulatory Visit | Attending: Radiation Oncology | Admitting: Radiation Oncology

## 2015-08-18 VITALS — BP 128/81 | HR 85 | Temp 98.7°F | Resp 20 | Wt 221.9 lb

## 2015-08-18 DIAGNOSIS — C7951 Secondary malignant neoplasm of bone: Secondary | ICD-10-CM

## 2015-08-18 MED FILL — OMEPRAZOLE DR 20 MG CAPSULE: 20 | 30 days supply | Qty: 30 | Fill #0

## 2015-08-18 NOTE — Progress Notes (Signed)
Weekly rad txs t spine 5/10 completed, post sim done, sonafine cream given, patient is aware to take decadron 4mg  bid now not the 40mg  on Mondays, his tightness in adomen to  his legs are improving since he started decadron 4mg  last night, he is asking for rx prilosec it is too respective over the counter 4/10 scale  Today better from yesterdays pain 7/10 from site of operation on  Mid back, getting fatigued BP 128/81 mmHg  Pulse 85  Temp(Src) 98.7 F (37.1 C) (Oral)  Resp 20  Wt 221 lb 14.4 oz (100.653 kg) 10:04 AM Wt Readings from Last 3 Encounters:  08/18/15 221 lb 14.4 oz (100.653 kg)  08/17/15 222 lb 6.4 oz (100.88 kg)  08/09/15 225 lb 11.2 oz (102.377 kg)

## 2015-08-18 NOTE — Telephone Encounter (Addendum)
[place] out patint, [phone], left vm for prilosec(omeprazole) take 1 20mg  tablet daily as directed, 30 tabs 1 refill, can call our office for any questions,pharmacy top call patient, , I called and informed patient just called in rx wait 1-2 hours 10:42 AM

## 2015-08-19 NOTE — Progress Notes (Signed)
  Radiation Oncology         [phone]) [phone] ________________________________  Name: Bradley Hunt MRN: [id number]  Date: 08/09/2015  DOB: January 17, 1971  SIMULATION AND TREATMENT PLANNING NOTE  DIAGNOSIS:     ICD-9-CM ICD-10-CM   1. Multiple myeloma not having achieved remission ([place]) 203.00 C90.00      Site:  Approximately T5-T10  NARRATIVE:  The patient was brought to the [facility].  Identity was confirmed.  All relevant records and images related to the planned course of therapy were reviewed.   Written consent to proceed with treatment was confirmed which was freely given after reviewing the details related to the planned course of therapy had been reviewed with the patient.  Then, the patient was set-up in a stable reproducible  supine position for radiation therapy.  CT images were obtained.  Surface markings were placed.     The CT images were loaded into the planning software.  Then the target and avoidance structures were contoured.  Treatment planning then occurred.  The radiation prescription was entered and confirmed.  A total of 2 complex treatment devices were fabricated which relate to the designed radiation treatment fields. Each of these customized fields/ complex treatment devices will be used on a daily basis during the radiation course. I have requested : 3D Simulation  I have requested a DVH of the following structures: Target volume, lungs, heart, spinal cord.   PLAN:  The patient will receive 25 Gy in 10 fractions.  ________________________________   Jodelle Gross, MD, PhD

## 2015-08-19 NOTE — Progress Notes (Signed)
Department of Radiation Oncology  Phone:  [phone] Fax:        [phone]  Weekly Treatment Note    Name: Bradley Hunt Date: 08/19/2015 MRN: [id number] DOB: March 21, 1970   Diagnosis:     ICD-9-CM ICD-10-CM   1. Bony metastasis ([place]) 198.5 C79.51      Current dose: 12.5 Gy  Current fraction: 10   MEDICATIONS: Current Outpatient Prescriptions  Medication Sig Dispense Refill  . acyclovir (ZOVIRAX) 400 MG tablet Take 1 tablet (400 mg total) by mouth 2 (two) times daily. 60 tablet 3  . Cholecalciferol (VITAMIN D) 2000 units tablet Take 1 tablet (2,000 Units total) by mouth daily. 30 tablet 11  . dexamethasone (DECADRON) 4 MG tablet Take 10 tablets (40 mg) every week on Mondays 40 tablet 3  . dexamethasone (DECADRON) 4 MG tablet Take 1 tablet (4 mg total) by mouth 2 (two) times daily. 60 tablet 1  . HYDROcodone-acetaminophen (NORCO/VICODIN) 5-325 MG tablet Take 1 tablet by mouth every 6 (six) hours as needed for moderate pain. 90 tablet 0  . lenalidomide (REVLIMID) 10 MG capsule Take 1 capsule (10 mg total) by mouth daily. For 14 days on and then 7 days off each cycle. (Patient not taking: Reported on 08/17/2015) 14 capsule 0  . lenalidomide (REVLIMID) 10 MG capsule Take 1 capsule daily for 14 days, off 7 days (Patient not taking: Reported on 08/17/2015) 14 capsule 9  . ondansetron (ZOFRAN) 8 MG tablet Take 1 tablet (8 mg total) by mouth every 8 (eight) hours as needed (Nausea or vomiting). 30 tablet 1  . prochlorperazine (COMPAZINE) 10 MG tablet Take 1 tablet (10 mg total) by mouth every 6 (six) hours as needed (Nausea or vomiting). (Patient not taking: Reported on 08/07/2015) 30 tablet 1  . senna-docusate (SENOKOT-S) 8.6-50 MG tablet Take 1 tablet by mouth at bedtime as needed for mild constipation. (Patient not taking: Reported on 08/09/2015) 30 tablet 0   No current facility-administered medications for this encounter.     ALLERGIES: Review of patient's allergies indicates  no known allergies.   LABORATORY DATA:  Lab Results  Component Value Date   WBC 3.3* 08/07/2015   HGB 10.0* 08/07/2015   HCT 28.9* 08/07/2015   MCV 88.7 08/07/2015   PLT 289 08/07/2015   Lab Results  Component Value Date   NA 142 08/07/2015   K 4.2 08/07/2015   CL 110 07/20/2015   CO2 26 08/07/2015   Lab Results  Component Value Date   ALT 15 08/07/2015   AST 15 08/07/2015   ALKPHOS 53 08/07/2015   BILITOT 0.55 08/07/2015     NARRATIVE: Khayree Barish was seen today for weekly treatment management. The chart was checked and the patient's films were reviewed.  Weekly rad txs t spine 5/10 completed, post sim done, sonafine cream given, patient is aware to take decadron 4mg  bid now not the 40mg  on Mondays, his tightness in adomen to  his legs are improving since he started decadron 4mg  last night, he is asking for rx prilosec it is too respective over the counter 4/10 scale  Today better from yesterdays pain 7/10 from site of operation on  Mid back, getting fatigued BP 128/81 mmHg  Pulse 85  Temp(Src) 98.7 F (37.1 C) (Oral)  Resp 20  Wt 221 lb 14.4 oz (100.653 kg) 12:14 PM Wt Readings from Last 3 Encounters:  08/18/15 221 lb 14.4 oz (100.653 kg)  08/17/15 222 lb 6.4 oz (100.88 kg)  08/09/15 225 lb  11.2 oz (102.377 kg)    PHYSICAL EXAMINATION: weight is 221 lb 14.4 oz (100.653 kg). His oral temperature is 98.7 F (37.1 C). His blood pressure is 128/81 and his pulse is 85. His respiration is 20.        ASSESSMENT: The patient is doing satisfactorily with treatment.  The patient states that he has begun feeling better after beginning Decadron. He is having some discomfort still in the upper back at the surgical site.  PLAN: We will continue with the patient's radiation treatment as planned.

## 2015-08-21 ENCOUNTER — Ambulatory Visit
Admission: RE | Admit: 2015-08-21 | Discharge: 2015-08-21 | Disposition: A | Discharge status: Home / Self Care | Payer: Self-pay | Source: Ambulatory Visit | Attending: Radiation Oncology | Admitting: Radiation Oncology

## 2015-08-21 ENCOUNTER — Encounter: Payer: Self-pay | Admitting: Hematology and Oncology

## 2015-08-21 ENCOUNTER — Ambulatory Visit (HOSPITAL_BASED_OUTPATIENT_CLINIC_OR_DEPARTMENT_OTHER): Payer: Medicaid Other

## 2015-08-21 ENCOUNTER — Telehealth: Payer: Self-pay | Admitting: Hematology and Oncology

## 2015-08-21 ENCOUNTER — Ambulatory Visit (HOSPITAL_BASED_OUTPATIENT_CLINIC_OR_DEPARTMENT_OTHER): Payer: Medicaid Other | Admitting: Hematology and Oncology

## 2015-08-21 ENCOUNTER — Telehealth: Payer: Self-pay | Admitting: Pharmacist

## 2015-08-21 ENCOUNTER — Other Ambulatory Visit (HOSPITAL_BASED_OUTPATIENT_CLINIC_OR_DEPARTMENT_OTHER): Payer: Medicaid Other

## 2015-08-21 VITALS — BP 128/68 | HR 78 | Temp 98.1°F | Resp 18 | Ht 75.0 in | Wt 224.8 lb

## 2015-08-21 DIAGNOSIS — Z5112 Encounter for antineoplastic immunotherapy: Secondary | ICD-10-CM | POA: Diagnosis not present

## 2015-08-21 DIAGNOSIS — C9000 Multiple myeloma not having achieved remission: Secondary | ICD-10-CM

## 2015-08-21 DIAGNOSIS — D630 Anemia in neoplastic disease: Secondary | ICD-10-CM | POA: Diagnosis not present

## 2015-08-21 DIAGNOSIS — G9529 Other cord compression: Secondary | ICD-10-CM

## 2015-08-21 DIAGNOSIS — C7951 Secondary malignant neoplasm of bone: Secondary | ICD-10-CM

## 2015-08-21 DIAGNOSIS — G9520 Unspecified cord compression: Secondary | ICD-10-CM | POA: Diagnosis not present

## 2015-08-21 LAB — COMPREHENSIVE METABOLIC PANEL
ALBUMIN: 3.9 g/dL (ref 3.5–5.0)
ALK PHOS: 44 U/L (ref 40–150)
ALT: 18 U/L (ref 0–55)
ANION GAP: 8 meq/L (ref 3–11)
AST: 11 U/L (ref 5–34)
BILIRUBIN TOTAL: 0.73 mg/dL (ref 0.20–1.20)
BUN: 15.9 mg/dL (ref 7.0–26.0)
CO2: 24 mEq/L (ref 22–29)
Calcium: 9.2 mg/dL (ref 8.4–10.4)
Chloride: 109 mEq/L (ref 98–109)
Creatinine: 1 mg/dL (ref 0.7–1.3)
Glucose: 120 mg/dl (ref 70–140)
POTASSIUM: 3.6 meq/L (ref 3.5–5.1)
Sodium: 141 mEq/L (ref 136–145)
TOTAL PROTEIN: 6.2 g/dL — AB (ref 6.4–8.3)

## 2015-08-21 LAB — CBC WITH DIFFERENTIAL/PLATELET
BASO%: 0 % (ref 0.0–2.0)
BASOS ABS: 0 10*3/uL (ref 0.0–0.1)
EOS ABS: 0 10*3/uL (ref 0.0–0.5)
EOS%: 0 % (ref 0.0–7.0)
HEMATOCRIT: 29.8 % — AB (ref 38.4–49.9)
HEMOGLOBIN: 10.4 g/dL — AB (ref 13.0–17.1)
LYMPH#: 0.9 10*3/uL (ref 0.9–3.3)
LYMPH%: 7.7 % — ABNORMAL LOW (ref 14.0–49.0)
MCH: 30.8 pg (ref 27.2–33.4)
MCHC: 34.9 g/dL (ref 32.0–36.0)
MCV: 88.2 fL (ref 79.3–98.0)
MONO#: 0.8 10*3/uL (ref 0.1–0.9)
MONO%: 6.6 % (ref 0.0–14.0)
NEUT%: 85.7 % — ABNORMAL HIGH (ref 39.0–75.0)
NEUTROS ABS: 9.9 10*3/uL — AB (ref 1.5–6.5)
Platelets: 241 10*3/uL (ref 140–400)
RBC: 3.38 10*6/uL — ABNORMAL LOW (ref 4.20–5.82)
RDW: 13.7 % (ref 11.0–14.6)
WBC: 11.6 10*3/uL — AB (ref 4.0–10.3)

## 2015-08-21 MED ORDER — PROCHLORPERAZINE MALEATE 10 MG PO TABS
10.0000 mg | ORAL_TABLET | Freq: Once | ORAL | Status: AC
  Administered 2015-08-21: 10 mg via ORAL

## 2015-08-21 MED ORDER — BORTEZOMIB CHEMO SQ INJECTION 3.5 MG (2.5MG/ML)
1.3000 mg/m2 | Freq: Once | INTRAMUSCULAR | Status: AC
  Administered 2015-08-21: 3 mg via SUBCUTANEOUS
  Filled 2015-08-21: qty 3

## 2015-08-21 MED ORDER — PROCHLORPERAZINE MALEATE 10 MG PO TABS
ORAL_TABLET | ORAL | Status: AC
  Filled 2015-08-21: qty 1

## 2015-08-21 NOTE — Progress Notes (Signed)
[facility] OFFICE PROGRESS NOTE  Patient Care Team: [name], MD as PCP - General (Internal Medicine)  SUMMARY OF ONCOLOGIC HISTORY:   Multiple myeloma not having achieved remission ([place])   06/23/2015 - 06/28/2015 Hospital Admission    The patient was admitted to the hospital due to gait ataxia and back pain. He was subsequently found to have cord compression underwent surgery and was discharged home     06/24/2015 Imaging    Abnormal appearance of the T6 vertebral body, highly suspicious for possible osseous metastasis. Associated pathologic fracture withup to 30% height loss. There is associated abnormal soft tissue density within the ventral epidural space,     06/24/2015 Imaging    MRI lumbar: Focal osseous lesion with abnormal enhancement involving the right pedicle of L3, suspicious for possible osseous metastasisgiven the findings in the thoracic spine. Question additional focal lesion within the right iliac wing as above.       06/25/2015 Pathology Results    Accession: [id number] bone biopsy come from plasma cell neoplasm.     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 He is seen prior to cycles 2 of Velcade He is started on radiation. He has persistent back pain No focal neurological deficits. He complained of tightness and numbness around his waist No worsening neuropathy No recent infection  REVIEW OF SYSTEMS:   Constitutional: Denies fevers, chills or abnormal weight loss Eyes: Denies blurriness of vision Ears, nose, mouth, throat, and face: Denies mucositis or sore throat Respiratory: Denies cough,  dyspnea or wheezes Cardiovascular: Denies palpitation, chest discomfort or lower extremity swelling Gastrointestinal:  Denies nausea, heartburn or change in bowel habits Skin: Denies abnormal skin rashes Lymphatics: Denies new lymphadenopathy or easy bruising Behavioral/Psych: Mood is stable, no new changes  All other systems were reviewed with the patient and are negative.  I have reviewed the past medical history, past surgical history, social history and family history with the patient and they are unchanged from previous note.  ALLERGIES:  has No Known Allergies.  MEDICATIONS:  Current Outpatient Prescriptions  Medication Sig Dispense Refill  . acyclovir (ZOVIRAX) 400 MG tablet Take 1 tablet (400 mg total) by mouth 2 (two) times daily. 60 tablet 3  . cholecalciferol (VITAMIN D) 1000 units tablet Take 1,000 Units by mouth daily.    Marland Kitchen dexamethasone (DECADRON) 4 MG tablet Take 1 tablet (4 mg total) by mouth 2 (two) times daily. 60 tablet 1  . HYDROcodone-acetaminophen (NORCO/VICODIN) 5-325 MG tablet Take 1 tablet by mouth every 6 (six) hours as needed for moderate pain. 90 tablet 0  . lenalidomide (REVLIMID) 10 MG capsule Take 1 capsule (10 mg total) by mouth daily. For 14 days on and then 7 days off each cycle. 14 capsule 0  . ondansetron (ZOFRAN) 8 MG tablet Take 1 tablet (8 mg total) by mouth every 8 (eight) hours as needed (Nausea or vomiting). 30 tablet 1  . senna-docusate (SENOKOT-S) 8.6-50 MG tablet Take 1 tablet by mouth at bedtime as needed for mild constipation. 30 tablet 0   No current facility-administered medications for this visit.  PHYSICAL EXAMINATION: ECOG PERFORMANCE STATUS: 1 - Symptomatic but completely ambulatory  Vitals:   08/21/15 1208  BP: 128/68  Pulse: 78  Resp: 18  Temp: 98.1 F (36.7 C)   Filed Weights   08/21/15 1208  Weight: 224 lb 12.8 oz (102 kg)    GENERAL:alert, no distress and comfortable SKIN: skin color, texture, turgor are normal,  no rashes or significant lesions EYES: normal, Conjunctiva are pink and non-injected, sclera clear OROPHARYNX:no exudate, no erythema and lips, buccal mucosa, and tongue normal  NECK: supple, thyroid normal size, non-tender, without nodularity LYMPH:  no palpable lymphadenopathy in the cervical, axillary or inguinal LUNGS: clear to auscultation and percussion with normal breathing effort HEART: regular rate & rhythm and no murmurs and no lower extremity edema ABDOMEN:abdomen soft, non-tender and normal bowel sounds Musculoskeletal:no cyanosis of digits and no clubbing  NEURO: alert & oriented x 3 with fluent speech, no focal motor/sensory deficits  LABORATORY DATA:  I have reviewed the data as listed    Component Value Date/Time   NA 141 08/21/2015 1135   K 3.6 08/21/2015 1135   CL 110 07/20/2015 0755   CO2 24 08/21/2015 1135   GLUCOSE 120 08/21/2015 1135   BUN 15.9 08/21/2015 1135   CREATININE 1.0 08/21/2015 1135   CALCIUM 9.2 08/21/2015 1135   PROT 6.2 (L) 08/21/2015 1135   ALBUMIN 3.9 08/21/2015 1135   AST 11 08/21/2015 1135   ALT 18 08/21/2015 1135   ALKPHOS 44 08/21/2015 1135   BILITOT 0.73 08/21/2015 1135   GFRNONAA 49 (L) 07/20/2015 0755   GFRAA 56 (L) 07/20/2015 0755    No results found for: SPEP, UPEP  Lab Results  Component Value Date   WBC 11.6 (H) 08/21/2015   NEUTROABS 9.9 (H) 08/21/2015   HGB 10.4 (L) 08/21/2015   HCT 29.8 (L) 08/21/2015   MCV 88.2 08/21/2015   PLT 241 08/21/2015      Chemistry      Component Value Date/Time   NA 141 08/21/2015 1135   K 3.6 08/21/2015 1135   CL 110 07/20/2015 0755   CO2 24 08/21/2015 1135   BUN 15.9 08/21/2015 1135   CREATININE 1.0 08/21/2015 1135      Component Value Date/Time   CALCIUM 9.2 08/21/2015 1135   ALKPHOS 44 08/21/2015 1135   AST 11 08/21/2015 1135   ALT 18 08/21/2015 1135   BILITOT 0.73 08/21/2015 1135      ASSESSMENT & PLAN:  Multiple myeloma not having achieved remission ([place]) This patient  had limited educational level to understand the scope of his disease. He understand that the disease is noncurable and treatment is generally palliative. However, conventional standard chemotherapy have high response rates with high chances of inducing remission. He would be a prime candidate to undergo autologous bone marrow transplant after induction chemotherapy. I recommend palliative radiation therapy to recent surgical site to reduce the risk of local disease progression causing cord compression. This was started The patient is poor with no insurance coverage. I am able to get him assistance to get Revlimid for free We will start addition of Revlimid on 08/21/15, to be taken on days 1-14 every cycle of 21 days The risks, benefits and side-effects of Revlimid are discussed and he agreed to proceed He needs to take aspirin for DVT prophylaxis He will continue Velcade on days 1, 4, 8 and 11 every 21 days He is on acyclovir for antimicrobial prophylaxis He is instructed to take vitamin D supplement. I will  get social worker to help him apply for disability and insurance coverage. I will see him back in 2-3 weeks for assessment of toxicity. Due to poor social circumstances, he does not have a dentist to get dental clearance prior to the Zometa. So far, he tolerated treatment well without any side effects.  Anemia in neoplastic disease This is likely anemia of chronic disease. The patient denies recent history of bleeding such as epistaxis, hematuria or hematochezia. He is asymptomatic from the anemia. We will observe for now.  He does not require transfusion now.    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 Clinically, he is relatively asymptomatic apart from sensation of tightness. He has some altered sensation down his legs but nothing to suggest imminent cord compression. He has no  appreciable weakness in both lower extremities He will continue radiation along with dexamethasone 4 mg BID    Bony metastasis ([place]) He has persistent back pain I recommend he continues percocet as needed I warn him risk of constipation and we discussed narcotic refill policy    Orders Placed This Encounter  Procedures  . Kappa/lambda light chains    Standing Status:   Future    Standing Expiration Date:   09/24/2016  . Multiple Myeloma Panel (SPEP&IFE w/QIG)    Standing Status:   Future    Standing Expiration Date:   09/24/2016   All questions were answered. The patient knows to call the clinic with any problems, questions or concerns. No barriers to learning was detected. I spent 25 minutes counseling the patient face to face. The total time spent in the appointment was 40 minutes and more than 50% was on counseling and review of test results     [facility] [place], MD 08/21/2015 4:03 PM

## 2015-08-21 NOTE — Telephone Encounter (Signed)
Pt will get updated sched at next apt

## 2015-08-21 NOTE — Telephone Encounter (Signed)
error 

## 2015-08-21 NOTE — Patient Instructions (Signed)
[facility] Discharge Instructions for Patients Receiving Chemotherapy  Today you received the following chemotherapy agents Velcade. To help prevent nausea and vomiting after your treatment, we encourage you to take your nausea medication as directed.  If you develop nausea and vomiting that is not controlled by your nausea medication, call the clinic.   BELOW ARE SYMPTOMS THAT SHOULD BE REPORTED IMMEDIATELY:  *FEVER GREATER THAN 100.5 F  *CHILLS WITH OR WITHOUT FEVER  NAUSEA AND VOMITING THAT IS NOT CONTROLLED WITH YOUR NAUSEA MEDICATION  *UNUSUAL SHORTNESS OF BREATH  *UNUSUAL BRUISING OR BLEEDING  TENDERNESS IN MOUTH AND THROAT WITH OR WITHOUT PRESENCE OF ULCERS  *URINARY PROBLEMS  *BOWEL PROBLEMS  UNUSUAL RASH Items with * indicate a potential emergency and should be followed up as soon as possible.  Feel free to call the clinic you have any questions or concerns. The clinic phone number is [phone].  Please show the CHEMO ALERT CARD at check-in to the Emergency Department and triage nurse.    

## 2015-08-22 ENCOUNTER — Telehealth: Payer: Self-pay | Admitting: Hematology and Oncology

## 2015-08-22 ENCOUNTER — Ambulatory Visit
Admission: RE | Admit: 2015-08-22 | Discharge: 2015-08-22 | Disposition: A | Discharge status: Home / Self Care | Payer: Self-pay | Source: Ambulatory Visit | Attending: Radiation Oncology | Admitting: Radiation Oncology

## 2015-08-22 MED FILL — ACYCLOVIR 400 MG TABLET: 400 | 30 days supply | Qty: 60 | Fill #1

## 2015-08-22 NOTE — Assessment & Plan Note (Signed)
This is likely anemia of chronic disease. The patient denies recent history of bleeding such as epistaxis, hematuria or hematochezia. He is asymptomatic from the anemia. We will observe for now.  He does not require transfusion now.   

## 2015-08-22 NOTE — Assessment & Plan Note (Signed)
I have consulted radiation oncology for palliative radiation therapy. Clinically, he is relatively asymptomatic apart from sensation of tightness. He has some altered sensation down his legs but nothing to suggest imminent cord compression. He has no appreciable weakness in both lower extremities He will continue radiation along with dexamethasone 4 mg BID

## 2015-08-22 NOTE — Assessment & Plan Note (Signed)
He has persistent back pain I recommend he continues percocet as needed I warn him risk of constipation and we discussed narcotic refill policy

## 2015-08-22 NOTE — Telephone Encounter (Signed)
Patient stopped by for schedule today and was given avs report and appointments for July and August. Appointments already on schedule per 7/24 pof.

## 2015-08-22 NOTE — Assessment & Plan Note (Addendum)
This patient had limited educational level to understand the scope of his disease. He understand that the disease is noncurable and treatment is generally palliative. However, conventional standard chemotherapy have high response rates with high chances of inducing remission. He would be a prime candidate to undergo autologous bone marrow transplant after induction chemotherapy. I recommend palliative radiation therapy to recent surgical site to reduce the risk of local disease progression causing cord compression. This was started The patient is poor with no insurance coverage. I am able to get him assistance to get Revlimid for free We will start addition of Revlimid on 08/21/15, to be taken on days 1-14 every cycle of 21 days The risks, benefits and side-effects of Revlimid are discussed and he agreed to proceed He needs to take aspirin for DVT prophylaxis He will continue Velcade on days 1, 4, 8 and 11 every 21 days He is on acyclovir for antimicrobial prophylaxis He is instructed to take vitamin D supplement. I will get social worker to help him apply for disability and insurance coverage. I will see him back in 2-3 weeks for assessment of toxicity. Due to poor social circumstances, he does not have a dentist to get dental clearance prior to the Zometa. So far, he tolerated treatment well without any side effects.

## 2015-08-23 ENCOUNTER — Ambulatory Visit
Admission: RE | Admit: 2015-08-23 | Discharge: 2015-08-23 | Disposition: A | Discharge status: Home / Self Care | Payer: Self-pay | Source: Ambulatory Visit | Attending: Radiation Oncology | Admitting: Radiation Oncology

## 2015-08-24 ENCOUNTER — Ambulatory Visit (HOSPITAL_BASED_OUTPATIENT_CLINIC_OR_DEPARTMENT_OTHER): Payer: Medicaid Other

## 2015-08-24 ENCOUNTER — Ambulatory Visit
Admission: RE | Admit: 2015-08-24 | Discharge: 2015-08-24 | Disposition: A | Discharge status: Home / Self Care | Payer: Self-pay | Source: Ambulatory Visit | Attending: Radiation Oncology | Admitting: Radiation Oncology

## 2015-08-24 VITALS — BP 131/81 | HR 64 | Temp 98.5°F | Resp 17

## 2015-08-24 DIAGNOSIS — Z5112 Encounter for antineoplastic immunotherapy: Secondary | ICD-10-CM

## 2015-08-24 DIAGNOSIS — C9000 Multiple myeloma not having achieved remission: Secondary | ICD-10-CM | POA: Diagnosis not present

## 2015-08-24 MED ORDER — DEXAMETHASONE 4 MG PO TABS
ORAL_TABLET | ORAL | Status: AC
  Filled 2015-08-24: qty 10

## 2015-08-24 MED ORDER — PROCHLORPERAZINE MALEATE 10 MG PO TABS
10.0000 mg | ORAL_TABLET | Freq: Once | ORAL | Status: AC
  Administered 2015-08-24: 10 mg via ORAL

## 2015-08-24 MED ORDER — BORTEZOMIB CHEMO SQ INJECTION 3.5 MG (2.5MG/ML)
1.3000 mg/m2 | Freq: Once | INTRAMUSCULAR | Status: AC
  Administered 2015-08-24: 3 mg via SUBCUTANEOUS
  Filled 2015-08-24: qty 3

## 2015-08-24 MED ORDER — DEXAMETHASONE 4 MG PO TABS
40.0000 mg | ORAL_TABLET | Freq: Once | ORAL | Status: AC
  Administered 2015-08-24: 4 mg via ORAL

## 2015-08-24 MED ORDER — PROCHLORPERAZINE MALEATE 10 MG PO TABS
ORAL_TABLET | ORAL | Status: AC
  Filled 2015-08-24: qty 1

## 2015-08-24 NOTE — Patient Instructions (Signed)
[facility] Discharge Instructions for Patients Receiving Chemotherapy  Today you received the following chemotherapy agents:  Velcade  To help prevent nausea and vomiting after your treatment, we encourage you to take your nausea medication as prescribed.   If you develop nausea and vomiting that is not controlled by your nausea medication, call the clinic.   BELOW ARE SYMPTOMS THAT SHOULD BE REPORTED IMMEDIATELY:  *FEVER GREATER THAN 100.5 F  *CHILLS WITH OR WITHOUT FEVER  NAUSEA AND VOMITING THAT IS NOT CONTROLLED WITH YOUR NAUSEA MEDICATION  *UNUSUAL SHORTNESS OF BREATH  *UNUSUAL BRUISING OR BLEEDING  TENDERNESS IN MOUTH AND THROAT WITH OR WITHOUT PRESENCE OF ULCERS  *URINARY PROBLEMS  *BOWEL PROBLEMS  UNUSUAL RASH Items with * indicate a potential emergency and should be followed up as soon as possible.  Feel free to call the clinic you have any questions or concerns. The clinic phone number is [phone].  Please show the CHEMO ALERT CARD at check-in to the Emergency Department and triage nurse.   

## 2015-08-25 ENCOUNTER — Ambulatory Visit
Admission: RE | Admit: 2015-08-25 | Discharge: 2015-08-25 | Disposition: A | Discharge status: Home / Self Care | Payer: No Typology Code available for payment source | Source: Ambulatory Visit | Attending: Radiation Oncology | Admitting: Radiation Oncology

## 2015-08-25 ENCOUNTER — Ambulatory Visit
Admission: RE | Admit: 2015-08-25 | Discharge: 2015-08-25 | Disposition: A | Discharge status: Home / Self Care | Payer: Self-pay | Source: Ambulatory Visit | Attending: Radiation Oncology | Admitting: Radiation Oncology

## 2015-08-25 ENCOUNTER — Encounter: Payer: Self-pay | Admitting: Radiation Oncology

## 2015-08-25 VITALS — BP 119/76 | HR 71 | Temp 97.9°F | Ht 75.0 in | Wt 223.0 lb

## 2015-08-25 DIAGNOSIS — C7951 Secondary malignant neoplasm of bone: Secondary | ICD-10-CM

## 2015-08-25 DIAGNOSIS — C9000 Multiple myeloma not having achieved remission: Secondary | ICD-10-CM

## 2015-08-25 MED ORDER — SUCRALFATE 1 G PO TABS: 1.0000 g | ORAL_TABLET | Freq: Three times a day (TID) | ORAL | 2 refills | Status: DC

## 2015-08-25 MED FILL — SUCRALFATE 1 GM TABLET: 1 | 30 days supply | Qty: 120 | Fill #0

## 2015-08-25 NOTE — Progress Notes (Signed)
Bradley Hunt completes XRT Today.  His main concern is difficulty swallowing food (studk in esophagus), with intermittent burning in the esophageal region.  Decadron Twice daily.  No signs of thrush upon oral inspection. Continues on chemotherapy - Velcade

## 2015-08-25 NOTE — Progress Notes (Signed)
Department of Radiation Oncology  Phone:  [phone] Fax:        [phone]  Weekly Treatment Note    Name: Bradley Hunt Date: 08/25/2015 MRN: [id number] DOB: [birth year]   Diagnosis:     ICD-9-CM ICD-10-CM   1. Multiple myeloma not having achieved remission ([place]) 203.00 C90.00   2. Bony metastasis ([facility]) 198.5 C79.51      Current dose: 25 Gy  Current fraction: 10   MEDICATIONS: Current Outpatient Prescriptions  Medication Sig Dispense Refill  . acyclovir (ZOVIRAX) 400 MG tablet Take 1 tablet (400 mg total) by mouth 2 (two) times daily. 60 tablet 3  . cholecalciferol (VITAMIN D) 1000 units tablet Take 1,000 Units by mouth daily.    Marland Kitchen dexamethasone (DECADRON) 4 MG tablet Take 1 tablet (4 mg total) by mouth 2 (two) times daily. 60 tablet 1  . HYDROcodone-acetaminophen (NORCO/VICODIN) 5-325 MG tablet Take 1 tablet by mouth every 6 (six) hours as needed for moderate pain. 90 tablet 0  . lenalidomide (REVLIMID) 10 MG capsule Take 1 capsule (10 mg total) by mouth daily. For 14 days on and then 7 days off each cycle. 14 capsule 0  . ondansetron (ZOFRAN) 8 MG tablet Take 1 tablet (8 mg total) by mouth every 8 (eight) hours as needed (Nausea or vomiting). 30 tablet 1  . senna-docusate (SENOKOT-S) 8.6-50 MG tablet Take 1 tablet by mouth at bedtime as needed for mild constipation. 30 tablet 0  . sucralfate (CARAFATE) 1 g tablet Take 1 tablet (1 g total) by mouth 4 (four) times daily -  with meals and at bedtime. 5 min before meals for radiation induced esophagitis 120 tablet 2   No current facility-administered medications for this encounter.      ALLERGIES: Review of patient's allergies indicates no known allergies.   LABORATORY DATA:  Lab Results  Component Value Date   WBC 11.6 (H) 08/21/2015   HGB 10.4 (L) 08/21/2015   HCT 29.8 (L) 08/21/2015   MCV 88.2 08/21/2015   PLT 241 08/21/2015   Lab Results  Component Value Date   NA 141 08/21/2015   K 3.6 08/21/2015   CL 110 07/20/2015   CO2 24 08/21/2015   Lab Results  Component Value Date   ALT 18 08/21/2015   AST 11 08/21/2015   ALKPHOS 44 08/21/2015   BILITOT 0.73 08/21/2015     NARRATIVE: Bradley Hunt was seen today for weekly treatment management. The chart was checked and the patient's films were reviewed.   Bradley Hunt completes XRT Today.  His main concern is difficulty swallowing food (stuckk in esophagus), with intermittent burning in the esophageal region.  Decadron Twice daily.  No signs of thrush upon oral inspection. Continues on chemotherapy - Velcade. The patient describes that he has some tightness in his legs.    BP 119/76 (BP Location: Right Arm, Patient Position: Sitting, Cuff Size: Normal)   Pulse 71   Temp 97.9 F (36.6 C)   Ht 6' 3"  (1.905 m)   Wt 223 lb (101.2 kg)   SpO2 100%   BMI 27.87 kg/m  11:35 AM Wt Readings from Last 3 Encounters:  08/25/15 223 lb (101.2 kg)  08/21/15 224 lb 12.8 oz (102 kg)  08/18/15 221 lb 14.4 oz (100.7 kg)    PHYSICAL EXAMINATION: height is 6' 3"  (1.905 m) and weight is 223 lb (101.2 kg). His temperature is 97.9 F (36.6 C). His blood pressure is 119/76 and his pulse is 71. His oxygen saturation  is 100%.      No thrush on exam.   ASSESSMENT: The patient is doing satisfactorily with treatment.    PLAN: The patient will follow up with Dr. Lisbeth Renshaw in one month. I will prescribe Carafate to the patient to help treat his symptomatic escophogeal pain.  I have also counseled the patient to take his Decadron one mg daily as a taper, and will resume to his normal dose next week.        This document serves as a record of services personally performed by Tyler Pita, MD. It was created on his behalf by Truddie Hidden, a trained medical scribe. The creation of this record is based on the scribe's personal observations and the provider's statements to them. This document has been checked and approved by the attending provider.

## 2015-08-28 ENCOUNTER — Other Ambulatory Visit (HOSPITAL_BASED_OUTPATIENT_CLINIC_OR_DEPARTMENT_OTHER): Payer: Medicaid Other

## 2015-08-28 ENCOUNTER — Other Ambulatory Visit: Payer: Self-pay | Admitting: Hematology and Oncology

## 2015-08-28 ENCOUNTER — Ambulatory Visit (HOSPITAL_BASED_OUTPATIENT_CLINIC_OR_DEPARTMENT_OTHER): Payer: Medicaid Other

## 2015-08-28 VITALS — BP 115/66 | HR 74 | Temp 98.0°F | Resp 16

## 2015-08-28 DIAGNOSIS — C9000 Multiple myeloma not having achieved remission: Secondary | ICD-10-CM

## 2015-08-28 DIAGNOSIS — Z5112 Encounter for antineoplastic immunotherapy: Secondary | ICD-10-CM | POA: Diagnosis not present

## 2015-08-28 LAB — COMPREHENSIVE METABOLIC PANEL
ALBUMIN: 3.4 g/dL — AB (ref 3.5–5.0)
ALK PHOS: 39 U/L — AB (ref 40–150)
ALT: 21 U/L (ref 0–55)
AST: 10 U/L (ref 5–34)
Anion Gap: 9 mEq/L (ref 3–11)
BUN: 22.1 mg/dL (ref 7.0–26.0)
CALCIUM: 9.1 mg/dL (ref 8.4–10.4)
CO2: 24 mEq/L (ref 22–29)
CREATININE: 1 mg/dL (ref 0.7–1.3)
Chloride: 107 mEq/L (ref 98–109)
EGFR: 89 mL/min/{1.73_m2} — ABNORMAL LOW (ref >90)
Glucose: 106 mg/dl (ref 70–140)
Potassium: 3.3 mEq/L — ABNORMAL LOW (ref 3.5–5.1)
Sodium: 140 mEq/L (ref 136–145)
TOTAL PROTEIN: 5.6 g/dL — AB (ref 6.4–8.3)
Total Bilirubin: 0.69 mg/dL (ref 0.20–1.20)

## 2015-08-28 LAB — CBC WITH DIFFERENTIAL/PLATELET
BASO%: 0.2 % (ref 0.0–2.0)
BASOS ABS: 0 10*3/uL (ref 0.0–0.1)
EOS%: 4.4 % (ref 0.0–7.0)
Eosinophils Absolute: 0.3 10*3/uL (ref 0.0–0.5)
HEMATOCRIT: 32.6 % — AB (ref 38.4–49.9)
HEMOGLOBIN: 11 g/dL — AB (ref 13.0–17.1)
LYMPH#: 0.8 10*3/uL — AB (ref 0.9–3.3)
LYMPH%: 12.9 % — ABNORMAL LOW (ref 14.0–49.0)
MCH: 31.1 pg (ref 27.2–33.4)
MCHC: 33.9 g/dL (ref 32.0–36.0)
MCV: 91.7 fL (ref 79.3–98.0)
MONO#: 0.7 10*3/uL (ref 0.1–0.9)
MONO%: 11.4 % (ref 0.0–14.0)
NEUT#: 4.3 10*3/uL (ref 1.5–6.5)
NEUT%: 71.1 % (ref 39.0–75.0)
Platelets: 223 10*3/uL (ref 140–400)
RBC: 3.55 10*6/uL — ABNORMAL LOW (ref 4.20–5.82)
RDW: 15.3 % — AB (ref 11.0–14.6)
WBC: 6 10*3/uL (ref 4.0–10.3)

## 2015-08-28 MED ORDER — BORTEZOMIB CHEMO SQ INJECTION 3.5 MG (2.5MG/ML)
1.3000 mg/m2 | Freq: Once | INTRAMUSCULAR | Status: AC
  Administered 2015-08-28: 3 mg via SUBCUTANEOUS
  Filled 2015-08-28: qty 3

## 2015-08-28 MED ORDER — PROCHLORPERAZINE MALEATE 10 MG PO TABS
ORAL_TABLET | ORAL | Status: AC
  Filled 2015-08-28: qty 1

## 2015-08-28 MED ORDER — PROCHLORPERAZINE MALEATE 10 MG PO TABS
10.0000 mg | ORAL_TABLET | Freq: Once | ORAL | Status: AC
  Administered 2015-08-28: 10 mg via ORAL

## 2015-08-28 NOTE — Patient Instructions (Signed)
[facility] Discharge Instructions for Patients Receiving Chemotherapy  Today you received the following chemotherapy agents:  Velcade  To help prevent nausea and vomiting after your treatment, we encourage you to take your nausea medication as prescribed.   If you develop nausea and vomiting that is not controlled by your nausea medication, call the clinic.   BELOW ARE SYMPTOMS THAT SHOULD BE REPORTED IMMEDIATELY:  *FEVER GREATER THAN 100.5 F  *CHILLS WITH OR WITHOUT FEVER  NAUSEA AND VOMITING THAT IS NOT CONTROLLED WITH YOUR NAUSEA MEDICATION  *UNUSUAL SHORTNESS OF BREATH  *UNUSUAL BRUISING OR BLEEDING  TENDERNESS IN MOUTH AND THROAT WITH OR WITHOUT PRESENCE OF ULCERS  *URINARY PROBLEMS  *BOWEL PROBLEMS  UNUSUAL RASH Items with * indicate a potential emergency and should be followed up as soon as possible.  Feel free to call the clinic you have any questions or concerns. The clinic phone number is [phone].  Please show the CHEMO ALERT CARD at check-in to the Emergency Department and triage nurse.   

## 2015-08-29 ENCOUNTER — Other Ambulatory Visit: Payer: Self-pay | Admitting: *Deleted

## 2015-08-29 LAB — KAPPA/LAMBDA LIGHT CHAINS
IG KAPPA FREE LIGHT CHAIN: 7.1 mg/L (ref 3.3–19.4)
Ig Lambda Free Light Chain: 88.7 mg/L — ABNORMAL HIGH (ref 5.7–26.3)
Kappa/Lambda FluidC Ratio: 0.08 — ABNORMAL LOW (ref 0.26–1.65)

## 2015-08-29 MED ORDER — LENALIDOMIDE 10 MG PO CAPS: 10.0000 mg | ORAL_CAPSULE | Freq: Every day | ORAL | 0 refills | Status: DC

## 2015-08-30 LAB — MULTIPLE MYELOMA PANEL, SERUM
Albumin SerPl Elph-Mcnc: 3.3 g/dL (ref 2.9–4.4)
Albumin/Glob SerPl: 1.8 — ABNORMAL HIGH (ref 0.7–1.7)
Alpha 1: 0.2 g/dL (ref 0.0–0.4)
Alpha2 Glob SerPl Elph-Mcnc: 0.7 g/dL (ref 0.4–1.0)
B-Globulin SerPl Elph-Mcnc: 0.7 g/dL (ref 0.7–1.3)
Gamma Glob SerPl Elph-Mcnc: 0.4 g/dL (ref 0.4–1.8)
Globulin, Total: 1.9 g/dL — ABNORMAL LOW (ref 2.2–3.9)
IGG (IMMUNOGLOBIN G), SERUM: 477 mg/dL — AB (ref 700–1600)
IGM (IMMUNOGLOBIN M), SRM: 16 mg/dL — AB (ref 20–172)
IgA, Qn, Serum: 15 mg/dL — ABNORMAL LOW (ref 90–386)
TOTAL PROTEIN: 5.2 g/dL — AB (ref 6.0–8.5)

## 2015-08-31 ENCOUNTER — Ambulatory Visit (HOSPITAL_BASED_OUTPATIENT_CLINIC_OR_DEPARTMENT_OTHER): Payer: Medicaid Other

## 2015-08-31 ENCOUNTER — Encounter: Payer: Self-pay | Admitting: Hematology and Oncology

## 2015-08-31 VITALS — BP 127/77 | HR 66 | Temp 98.4°F | Resp 20

## 2015-08-31 DIAGNOSIS — C9000 Multiple myeloma not having achieved remission: Secondary | ICD-10-CM | POA: Diagnosis not present

## 2015-08-31 DIAGNOSIS — Z5112 Encounter for antineoplastic immunotherapy: Secondary | ICD-10-CM | POA: Diagnosis not present

## 2015-08-31 MED ORDER — BORTEZOMIB CHEMO SQ INJECTION 3.5 MG (2.5MG/ML)
1.3000 mg/m2 | Freq: Once | INTRAMUSCULAR | Status: AC
  Administered 2015-08-31: 3 mg via SUBCUTANEOUS
  Filled 2015-08-31: qty 3

## 2015-08-31 MED ORDER — PROCHLORPERAZINE MALEATE 10 MG PO TABS
10.0000 mg | ORAL_TABLET | Freq: Once | ORAL | Status: AC
  Administered 2015-08-31: 10 mg via ORAL

## 2015-08-31 MED ORDER — PROCHLORPERAZINE MALEATE 10 MG PO TABS
ORAL_TABLET | ORAL | Status: AC
  Filled 2015-08-31: qty 1

## 2015-08-31 NOTE — Patient Instructions (Signed)
[facility] Discharge Instructions for Patients Receiving Chemotherapy  Today you received the following chemotherapy agents Velcade  To help prevent nausea and vomiting after your treatment, we encourage you to take your nausea medication    If you develop nausea and vomiting that is not controlled by your nausea medication, call the clinic.   BELOW ARE SYMPTOMS THAT SHOULD BE REPORTED IMMEDIATELY:  *FEVER GREATER THAN 100.5 F  *CHILLS WITH OR WITHOUT FEVER  NAUSEA AND VOMITING THAT IS NOT CONTROLLED WITH YOUR NAUSEA MEDICATION  *UNUSUAL SHORTNESS OF BREATH  *UNUSUAL BRUISING OR BLEEDING  TENDERNESS IN MOUTH AND THROAT WITH OR WITHOUT PRESENCE OF ULCERS  *URINARY PROBLEMS  *BOWEL PROBLEMS  UNUSUAL RASH Items with * indicate a potential emergency and should be followed up as soon as possible.  Feel free to call the clinic you have any questions or concerns. The clinic phone number is [phone].  Please show the CHEMO ALERT CARD at check-in to the Emergency Department and triage nurse.   

## 2015-08-31 NOTE — Progress Notes (Unsigned)
Patient came in to follow up on status of Medicaid. Gave patient rep form to sign giving permission for hospital financial counselor to inquire about his Medicaid through [place]. Patient signed form and I emailed to [employer].

## 2015-09-05 ENCOUNTER — Encounter (HOSPITAL_COMMUNITY): Payer: Self-pay

## 2015-09-11 ENCOUNTER — Ambulatory Visit (HOSPITAL_BASED_OUTPATIENT_CLINIC_OR_DEPARTMENT_OTHER): Payer: Medicaid Other | Admitting: Hematology and Oncology

## 2015-09-11 ENCOUNTER — Encounter: Payer: Self-pay | Admitting: Hematology and Oncology

## 2015-09-11 ENCOUNTER — Ambulatory Visit (HOSPITAL_BASED_OUTPATIENT_CLINIC_OR_DEPARTMENT_OTHER): Payer: Medicaid Other

## 2015-09-11 ENCOUNTER — Other Ambulatory Visit (HOSPITAL_BASED_OUTPATIENT_CLINIC_OR_DEPARTMENT_OTHER): Payer: Medicaid Other

## 2015-09-11 VITALS — BP 133/73 | HR 82 | Temp 98.1°F | Resp 18 | Ht 75.0 in | Wt 230.0 lb

## 2015-09-11 DIAGNOSIS — R2243 Localized swelling, mass and lump, lower limb, bilateral: Secondary | ICD-10-CM | POA: Diagnosis not present

## 2015-09-11 DIAGNOSIS — G9520 Unspecified cord compression: Secondary | ICD-10-CM | POA: Diagnosis not present

## 2015-09-11 DIAGNOSIS — D630 Anemia in neoplastic disease: Secondary | ICD-10-CM

## 2015-09-11 DIAGNOSIS — C9000 Multiple myeloma not having achieved remission: Secondary | ICD-10-CM

## 2015-09-11 DIAGNOSIS — Z5112 Encounter for antineoplastic immunotherapy: Secondary | ICD-10-CM | POA: Diagnosis not present

## 2015-09-11 DIAGNOSIS — G9529 Other cord compression: Secondary | ICD-10-CM

## 2015-09-11 DIAGNOSIS — G893 Neoplasm related pain (acute) (chronic): Secondary | ICD-10-CM

## 2015-09-11 DIAGNOSIS — C7951 Secondary malignant neoplasm of bone: Secondary | ICD-10-CM

## 2015-09-11 LAB — CBC WITH DIFFERENTIAL/PLATELET
BASO%: 0.4 % (ref 0.0–2.0)
Basophils Absolute: 0 10*3/uL (ref 0.0–0.1)
EOS ABS: 0.2 10*3/uL (ref 0.0–0.5)
EOS%: 3.6 % (ref 0.0–7.0)
HEMATOCRIT: 34.6 % — AB (ref 38.4–49.9)
HGB: 11.4 g/dL — ABNORMAL LOW (ref 13.0–17.1)
LYMPH#: 0.9 10*3/uL (ref 0.9–3.3)
LYMPH%: 17.7 % (ref 14.0–49.0)
MCH: 30.8 pg (ref 27.2–33.4)
MCHC: 33 g/dL (ref 32.0–36.0)
MCV: 93.4 fL (ref 79.3–98.0)
MONO#: 0.8 10*3/uL (ref 0.1–0.9)
MONO%: 16.3 % — ABNORMAL HIGH (ref 0.0–14.0)
NEUT%: 62 % (ref 39.0–75.0)
NEUTROS ABS: 3.1 10*3/uL (ref 1.5–6.5)
PLATELETS: 217 10*3/uL (ref 140–400)
RBC: 3.7 10*6/uL — AB (ref 4.20–5.82)
RDW: 15.1 % — ABNORMAL HIGH (ref 11.0–14.6)
WBC: 5 10*3/uL (ref 4.0–10.3)

## 2015-09-11 LAB — COMPREHENSIVE METABOLIC PANEL
ALBUMIN: 3.5 g/dL (ref 3.5–5.0)
ALK PHOS: 33 U/L — AB (ref 40–150)
ALT: 19 U/L (ref 0–55)
AST: 11 U/L (ref 5–34)
Anion Gap: 8 mEq/L (ref 3–11)
BUN: 18.2 mg/dL (ref 7.0–26.0)
CALCIUM: 9 mg/dL (ref 8.4–10.4)
CHLORIDE: 109 meq/L (ref 98–109)
CO2: 23 mEq/L (ref 22–29)
Creatinine: 1 mg/dL (ref 0.7–1.3)
Glucose: 111 mg/dl (ref 70–140)
POTASSIUM: 3.8 meq/L (ref 3.5–5.1)
SODIUM: 140 meq/L (ref 136–145)
Total Bilirubin: 0.75 mg/dL (ref 0.20–1.20)
Total Protein: 5.8 g/dL — ABNORMAL LOW (ref 6.4–8.3)

## 2015-09-11 MED ORDER — PROCHLORPERAZINE MALEATE 10 MG PO TABS
10.0000 mg | ORAL_TABLET | Freq: Once | ORAL | Status: AC
  Administered 2015-09-11: 10 mg via ORAL

## 2015-09-11 MED ORDER — PROCHLORPERAZINE MALEATE 10 MG PO TABS
ORAL_TABLET | ORAL | Status: AC
  Filled 2015-09-11: qty 1

## 2015-09-11 MED ORDER — HYDROCODONE-ACETAMINOPHEN 5-325 MG PO TABS: 1.0000 | ORAL_TABLET | Freq: Four times a day (QID) | ORAL | 0 refills | Status: DC | PRN

## 2015-09-11 MED ORDER — BORTEZOMIB CHEMO SQ INJECTION 3.5 MG (2.5MG/ML)
1.3000 mg/m2 | Freq: Once | INTRAMUSCULAR | Status: AC
  Administered 2015-09-11: 3 mg via SUBCUTANEOUS
  Filled 2015-09-11: qty 3

## 2015-09-11 MED FILL — HYDROCODON-APAP 5-325: 5-325 | 22 days supply | Qty: 90 | Fill #0

## 2015-09-11 NOTE — Assessment & Plan Note (Signed)
He has cancer associated pain. This is improving I refill his prescription today and we discussed narcotic refill policy

## 2015-09-11 NOTE — Assessment & Plan Note (Signed)
This is related to fluid retention from recent increased doses of dexamethasone. I reassured the patient. I will try to wean him off dexamethasone slowly over the next few weeks

## 2015-09-11 NOTE — Assessment & Plan Note (Signed)
He has completed palliative radiation therapy. Clinically, he is relatively asymptomatic apart from sensation of tightness. He has some altered sensation down his legs but nothing to suggest imminent cord compression. He has no appreciable weakness in both lower extremities I will start to reduce dexamethasone to pulsed dose 20 mg once a week

## 2015-09-11 NOTE — Progress Notes (Signed)
[facility] OFFICE PROGRESS NOTE  Patient Care Team: [name], MD as PCP - General (Internal Medicine)  SUMMARY OF ONCOLOGIC HISTORY:   Multiple myeloma not having achieved remission ([place])   06/23/2015 - 06/28/2015 Hospital Admission    The patient was admitted to the hospital due to gait ataxia and back pain. He was subsequently found to have cord compression underwent surgery and was discharged home     06/24/2015 Imaging    Abnormal appearance of the T6 vertebral body, highly suspicious for possible osseous metastasis. Associated pathologic fracture withup to 30% height loss. There is associated abnormal soft tissue density within the ventral epidural space,     06/24/2015 Imaging    MRI lumbar: Focal osseous lesion with abnormal enhancement involving the right pedicle of L3, suspicious for possible osseous metastasisgiven the findings in the thoracic spine. Question additional focal lesion within the right iliac wing as above.       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 He returns for follow-up. He tolerated radiation therapy well. The tightness and numbness down his waist is improving. He had no recent fall or new neurological deficit. He has some leg swelling and has gained a lot of weight since we increased the dose of dexamethasone. The patient denies any recent signs or symptoms of bleeding such as spontaneous epistaxis, hematuria or hematochezia. His back pain is improving and he is taking less pain  medicine  REVIEW OF SYSTEMS:   Constitutional: Denies fevers, chills or abnormal weight loss Eyes: Denies blurriness of vision Ears, nose, mouth, throat, and face: Denies mucositis or sore throat Respiratory: Denies cough, dyspnea or wheezes Cardiovascular: Denies palpitation, chest discomfort  Gastrointestinal:  Denies nausea, heartburn or change in bowel habits Skin: Denies abnormal skin rashes Lymphatics: Denies new lymphadenopathy or easy bruising Neurological:Denies numbness, tingling or new weaknesses Behavioral/Psych: Mood is stable, no new changes  All other systems were reviewed with the patient and are negative.  I have reviewed the past medical history, past surgical history, social history and family history with the patient and they are unchanged from previous note.  ALLERGIES:  has No Known Allergies.  MEDICATIONS:  Current Outpatient Prescriptions  Medication Sig Dispense Refill  . acyclovir (ZOVIRAX) 400 MG tablet Take 1 tablet (400 mg total) by mouth 2 (two) times daily. 60 tablet 3  . cholecalciferol (VITAMIN D) 1000 units tablet Take 1,000 Units by mouth daily.    Marland Kitchen dexamethasone (DECADRON) 4 MG tablet Take 1 tablet (4 mg total) by mouth 2 (two) times daily. 60 tablet 1  . HYDROcodone-acetaminophen (NORCO/VICODIN) 5-325 MG tablet Take 1 tablet by mouth every 6 (six) hours as needed for moderate pain. 90 tablet 0  . lenalidomide (REVLIMID) 10 MG capsule Take 1 capsule (10 mg total) by mouth daily. For 14 days on and then 7 days off each cycle. 14 capsule 0  . ondansetron (ZOFRAN) 8 MG tablet Take 1 tablet (8 mg total) by mouth every 8 (eight) hours  as needed (Nausea or vomiting). 30 tablet 1  . senna-docusate (SENOKOT-S) 8.6-50 MG tablet Take 1 tablet by mouth at bedtime as needed for mild constipation. 30 tablet 0  . sucralfate (CARAFATE) 1 g tablet Take 1 tablet (1 g total) by mouth 4 (four) times daily -  with meals and at bedtime. 5 min before meals for radiation  induced esophagitis 120 tablet 2   No current facility-administered medications for this visit.    Facility-Administered Medications Ordered in Other Visits  Medication Dose Route Frequency Provider Last Rate Last Dose  . bortezomib SQ (VELCADE) chemo injection 3 mg  1.3 mg/m2 (Treatment Plan Recorded) Subcutaneous Once [name], MD        PHYSICAL EXAMINATION: ECOG PERFORMANCE STATUS: 1 - Symptomatic but completely ambulatory  Vitals:   09/11/15 1140  BP: 133/73  Pulse: 82  Resp: 18  Temp: 98.1 F (36.7 C)   Filed Weights   09/11/15 1140  Weight: 230 lb (104.3 kg)    GENERAL:alert, no distress and comfortable SKIN: skin color, texture, turgor are normal, no rashes or significant lesions EYES: normal, Conjunctiva are pink and non-injected, sclera clear OROPHARYNX:no exudate, no erythema and lips, buccal mucosa, and tongue normal  NECK: supple, thyroid normal size, non-tender, without nodularity LYMPH:  no palpable lymphadenopathy in the cervical, axillary or inguinal LUNGS: clear to auscultation and percussion with normal breathing effort HEART: regular rate & rhythm and no murmurs with mild lower extremity edema ABDOMEN:abdomen soft, non-tender and normal bowel sounds Musculoskeletal:no cyanosis of digits and no clubbing  NEURO: alert & oriented x 3 with fluent speech, no focal motor/sensory deficits  LABORATORY DATA:  I have reviewed the data as listed    Component Value Date/Time   NA 140 09/11/2015 1130   K 3.8 09/11/2015 1130   CL 110 07/20/2015 0755   CO2 23 09/11/2015 1130   GLUCOSE 111 09/11/2015 1130   BUN 18.2 09/11/2015 1130   CREATININE 1.0 09/11/2015 1130   CALCIUM 9.0 09/11/2015 1130   PROT 5.8 (L) 09/11/2015 1130   ALBUMIN 3.5 09/11/2015 1130   AST 11 09/11/2015 1130   ALT 19 09/11/2015 1130   ALKPHOS 33 (L) 09/11/2015 1130   BILITOT 0.75 09/11/2015 1130   GFRNONAA 49 (L) 07/20/2015 0755   GFRAA 56 (L) 07/20/2015 0755    No results found  for: SPEP, UPEP  Lab Results  Component Value Date   WBC 5.0 09/11/2015   NEUTROABS 3.1 09/11/2015   HGB 11.4 (L) 09/11/2015   HCT 34.6 (L) 09/11/2015   MCV 93.4 09/11/2015   PLT 217 09/11/2015      Chemistry      Component Value Date/Time   NA 140 09/11/2015 1130   K 3.8 09/11/2015 1130   CL 110 07/20/2015 0755   CO2 23 09/11/2015 1130   BUN 18.2 09/11/2015 1130   CREATININE 1.0 09/11/2015 1130      Component Value Date/Time   CALCIUM 9.0 09/11/2015 1130   ALKPHOS 33 (L) 09/11/2015 1130   AST 11 09/11/2015 1130   ALT 19 09/11/2015 1130   BILITOT 0.75 09/11/2015 1130      ASSESSMENT & PLAN:  Multiple myeloma not having achieved remission [facility]) He has completed recent radiation treatment. He tolerates chemotherapy well without major side effects. Recent myeloma panel showed improved disease control. He will complete cycle 3 treatment this month and I will recheck myeloma panel again before I see him back next month. I'm not able to refer him  for bone marrow transplant evaluation until the patient has obtained insurance coverage. He will continue calcium with vitamin D He will take aspirin for DVT prophylaxis He has not been prescribed Zometa due to inability to get dental clearance due to lack of insurance I will reduce dexamethasone to 20 mg once a week on Tuesdays  Anemia in neoplastic disease This is likely anemia of chronic disease. The patient denies recent history of bleeding such as epistaxis, hematuria or hematochezia. He is asymptomatic from the anemia. We will observe for now.  He does not require transfusion now.    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 Clinically, he is relatively asymptomatic apart from sensation of tightness. He has some altered sensation down his legs but nothing to suggest imminent cord compression. He has no appreciable weakness in both lower  extremities I will start to reduce dexamethasone to pulsed dose 20 mg once a week    Cancer associated pain He has cancer associated pain. This is improving I refill his prescription today and we discussed narcotic refill policy  Localized swelling of both lower legs This is related to fluid retention from recent increased doses of dexamethasone. I reassured the patient. I will try to wean him off dexamethasone slowly over the next few weeks   Orders Placed This Encounter  Procedures  . Kappa/lambda light chains    Standing Status:   Future    Standing Expiration Date:   10/15/2016  . Multiple Myeloma Panel (SPEP&IFE w/QIG)    Standing Status:   Future    Standing Expiration Date:   10/15/2016   All questions were answered. The patient knows to call the clinic with any problems, questions or concerns. No barriers to learning was detected. I spent 25 minutes counseling the patient face to face. The total time spent in the appointment was 30 minutes and more than 50% was on counseling and review of test results     [facility], [place], MD 09/11/2015 12:57 PM

## 2015-09-11 NOTE — Assessment & Plan Note (Signed)
He has completed recent radiation treatment. He tolerates chemotherapy well without major side effects. Recent myeloma panel showed improved disease control. He will complete cycle 3 treatment this month and I will recheck myeloma panel again before I see him back next month. I'm not able to refer him for bone marrow transplant evaluation until the patient has obtained insurance coverage. He will continue calcium with vitamin D He will take aspirin for DVT prophylaxis He has not been prescribed Zometa due to inability to get dental clearance due to lack of insurance I will reduce dexamethasone to 20 mg once a week on Tuesdays

## 2015-09-11 NOTE — Assessment & Plan Note (Signed)
This is likely anemia of chronic disease. The patient denies recent history of bleeding such as epistaxis, hematuria or hematochezia. He is asymptomatic from the anemia. We will observe for now.  He does not require transfusion now.   

## 2015-09-11 NOTE — Progress Notes (Signed)
RN came over to check status of insurance for patient. Advised her I would contact [facility] on hospital side to find out if a status is update is available on pending Medicaid. Called [name]([facility]) on hospital side to get an update on Medicaid. [name] states she would send an email to DSS to inquire due to it being difficult to reach someone via telephone over there. She will let me know what she finds out. Called patient to follow up if he has received a letter from [place] concerning Medicaid. Patient states he has not and was going to come and see me when he is done with infusion. Will advise RN and doctor of status when provided to me.

## 2015-09-11 NOTE — Patient Instructions (Signed)
[facility] Discharge Instructions for Patients Receiving Chemotherapy  Today you received the following chemotherapy agents:  Velcade  To help prevent nausea and vomiting after your treatment, we encourage you to take your nausea medication as ordered per MD.   If you develop nausea and vomiting that is not controlled by your nausea medication, call the clinic.   BELOW ARE SYMPTOMS THAT SHOULD BE REPORTED IMMEDIATELY:  *FEVER GREATER THAN 100.5 F  *CHILLS WITH OR WITHOUT FEVER  NAUSEA AND VOMITING THAT IS NOT CONTROLLED WITH YOUR NAUSEA MEDICATION  *UNUSUAL SHORTNESS OF BREATH  *UNUSUAL BRUISING OR BLEEDING  TENDERNESS IN MOUTH AND THROAT WITH OR WITHOUT PRESENCE OF ULCERS  *URINARY PROBLEMS  *BOWEL PROBLEMS  UNUSUAL RASH Items with * indicate a potential emergency and should be followed up as soon as possible.  Feel free to call the clinic you have any questions or concerns. The clinic phone number is [phone].  Please show the CHEMO ALERT CARD at check-in to the Emergency Department and triage nurse.   

## 2015-09-12 ENCOUNTER — Encounter: Payer: Self-pay | Admitting: *Deleted

## 2015-09-12 NOTE — Progress Notes (Signed)
[employer] sent message that patients Revlimid will be filled by [facility].

## 2015-09-14 ENCOUNTER — Ambulatory Visit (HOSPITAL_BASED_OUTPATIENT_CLINIC_OR_DEPARTMENT_OTHER): Payer: Medicaid Other

## 2015-09-14 ENCOUNTER — Telehealth: Payer: Self-pay | Admitting: Hematology and Oncology

## 2015-09-14 VITALS — BP 126/73 | HR 73 | Temp 98.5°F | Resp 18

## 2015-09-14 DIAGNOSIS — Z5112 Encounter for antineoplastic immunotherapy: Secondary | ICD-10-CM

## 2015-09-14 DIAGNOSIS — C9000 Multiple myeloma not having achieved remission: Secondary | ICD-10-CM

## 2015-09-14 MED ORDER — PROCHLORPERAZINE MALEATE 10 MG PO TABS
10.0000 mg | ORAL_TABLET | Freq: Once | ORAL | Status: AC
  Administered 2015-09-14: 10 mg via ORAL

## 2015-09-14 MED ORDER — PROCHLORPERAZINE MALEATE 10 MG PO TABS
ORAL_TABLET | ORAL | Status: AC
  Filled 2015-09-14: qty 1

## 2015-09-14 MED ORDER — BORTEZOMIB CHEMO SQ INJECTION 3.5 MG (2.5MG/ML)
1.3000 mg/m2 | Freq: Once | INTRAMUSCULAR | Status: AC
  Administered 2015-09-14: 3 mg via SUBCUTANEOUS
  Filled 2015-09-14: qty 3

## 2015-09-14 NOTE — Telephone Encounter (Signed)
CALLED PT [facility]M [facility]. L/M. APPT LTR AND SCHD MAILED.

## 2015-09-14 NOTE — Patient Instructions (Signed)
[facility] Discharge Instructions for Patients Receiving Chemotherapy  Today you received the following chemotherapy agents:  Velcade  To help prevent nausea and vomiting after your treatment, we encourage you to take your nausea medication as ordered per MD.   If you develop nausea and vomiting that is not controlled by your nausea medication, call the clinic.   BELOW ARE SYMPTOMS THAT SHOULD BE REPORTED IMMEDIATELY:  *FEVER GREATER THAN 100.5 F  *CHILLS WITH OR WITHOUT FEVER  NAUSEA AND VOMITING THAT IS NOT CONTROLLED WITH YOUR NAUSEA MEDICATION  *UNUSUAL SHORTNESS OF BREATH  *UNUSUAL BRUISING OR BLEEDING  TENDERNESS IN MOUTH AND THROAT WITH OR WITHOUT PRESENCE OF ULCERS  *URINARY PROBLEMS  *BOWEL PROBLEMS  UNUSUAL RASH Items with * indicate a potential emergency and should be followed up as soon as possible.  Feel free to call the clinic you have any questions or concerns. The clinic phone number is [phone].  Please show the CHEMO ALERT CARD at check-in to the Emergency Department and triage nurse.   

## 2015-09-18 ENCOUNTER — Encounter: Payer: Self-pay | Admitting: Hematology and Oncology

## 2015-09-18 ENCOUNTER — Ambulatory Visit (HOSPITAL_BASED_OUTPATIENT_CLINIC_OR_DEPARTMENT_OTHER): Payer: Medicaid Other

## 2015-09-18 ENCOUNTER — Other Ambulatory Visit (HOSPITAL_BASED_OUTPATIENT_CLINIC_OR_DEPARTMENT_OTHER): Payer: Medicaid Other

## 2015-09-18 VITALS — BP 133/93 | HR 105 | Temp 98.4°F | Resp 18

## 2015-09-18 DIAGNOSIS — Z5112 Encounter for antineoplastic immunotherapy: Secondary | ICD-10-CM

## 2015-09-18 DIAGNOSIS — C9000 Multiple myeloma not having achieved remission: Secondary | ICD-10-CM

## 2015-09-18 LAB — CBC WITH DIFFERENTIAL/PLATELET
BASO%: 0.7 % (ref 0.0–2.0)
BASOS ABS: 0 10*3/uL (ref 0.0–0.1)
EOS%: 6.1 % (ref 0.0–7.0)
Eosinophils Absolute: 0.3 10*3/uL (ref 0.0–0.5)
HCT: 37.7 % — ABNORMAL LOW (ref 38.4–49.9)
HEMOGLOBIN: 12.4 g/dL — AB (ref 13.0–17.1)
LYMPH%: 12.6 % — AB (ref 14.0–49.0)
MCH: 30.5 pg (ref 27.2–33.4)
MCHC: 32.8 g/dL (ref 32.0–36.0)
MCV: 93.1 fL (ref 79.3–98.0)
MONO#: 0.4 10*3/uL (ref 0.1–0.9)
MONO%: 7.8 % (ref 0.0–14.0)
NEUT%: 72.8 % (ref 39.0–75.0)
NEUTROS ABS: 3.3 10*3/uL (ref 1.5–6.5)
Platelets: 173 10*3/uL (ref 140–400)
RBC: 4.05 10*6/uL — AB (ref 4.20–5.82)
RDW: 14.7 % — AB (ref 11.0–14.6)
WBC: 4.5 10*3/uL (ref 4.0–10.3)
lymph#: 0.6 10*3/uL — ABNORMAL LOW (ref 0.9–3.3)

## 2015-09-18 LAB — COMPREHENSIVE METABOLIC PANEL
ALBUMIN: 3.5 g/dL (ref 3.5–5.0)
ALK PHOS: 46 U/L (ref 40–150)
ALT: 23 U/L (ref 0–55)
ANION GAP: 8 meq/L (ref 3–11)
AST: 15 U/L (ref 5–34)
BILIRUBIN TOTAL: 0.68 mg/dL (ref 0.20–1.20)
BUN: 9 mg/dL (ref 7.0–26.0)
CO2: 26 meq/L (ref 22–29)
CREATININE: 1.1 mg/dL (ref 0.7–1.3)
Calcium: 9.2 mg/dL (ref 8.4–10.4)
Chloride: 106 mEq/L (ref 98–109)
EGFR: 83 mL/min/{1.73_m2} — ABNORMAL LOW (ref >90)
Glucose: 82 mg/dl (ref 70–140)
Potassium: 3.5 mEq/L (ref 3.5–5.1)
Sodium: 140 mEq/L (ref 136–145)
TOTAL PROTEIN: 6.4 g/dL (ref 6.4–8.3)

## 2015-09-18 MED ORDER — PROCHLORPERAZINE MALEATE 10 MG PO TABS
10.0000 mg | ORAL_TABLET | Freq: Once | ORAL | Status: AC
  Administered 2015-09-18: 10 mg via ORAL

## 2015-09-18 MED ORDER — BORTEZOMIB CHEMO SQ INJECTION 3.5 MG (2.5MG/ML)
1.3000 mg/m2 | Freq: Once | INTRAMUSCULAR | Status: AC
  Administered 2015-09-18: 3 mg via SUBCUTANEOUS
  Filled 2015-09-18: qty 3

## 2015-09-18 MED ORDER — PROCHLORPERAZINE MALEATE 10 MG PO TABS
ORAL_TABLET | ORAL | Status: AC
  Filled 2015-09-18: qty 1

## 2015-09-18 NOTE — Progress Notes (Signed)
Patient states he received a letter from DSS(he left in his car) regarding his Medicaid and information he needs to submit. Patient asked if he can go to DSS to turn in information. Advised him that he could and his caseworker's name and number should be on the letter to be sure the documents get to the right person. Patient verbalized understanding.

## 2015-09-18 NOTE — Progress Notes (Signed)
Pt complaint of sinus drainage, HA, and fever symptoms over the weekend. Dr. Alvy Bimler called and okay to administer tx today. No fever upon assessment. Also complained of pain at injection site from last week. Another RN Elmyra Ricks) to perform administration for pt comfort. Pt tolerated tx well. No additional symptoms experienced. Pt encouraged to take tylenol at home for symptom management of HA. AVS printed. Pt verbalized understanding.

## 2015-09-18 NOTE — Patient Instructions (Signed)
[facility] Discharge [facility] for Patients Receiving Chemotherapy  Today you received the following chemotherapy agents:  Velcade  To help prevent nausea and vomiting after your treatment, we encourage you to take your nausea medication as ordered per MD.   If you develop nausea and vomiting that is not controlled by your nausea medication, call the clinic.   BELOW ARE SYMPTOMS THAT SHOULD BE REPORTED IMMEDIATELY:  *FEVER GREATER THAN 100.5 F  *CHILLS WITH OR WITHOUT FEVER  NAUSEA AND VOMITING THAT IS NOT CONTROLLED WITH YOUR NAUSEA MEDICATION  *UNUSUAL SHORTNESS OF BREATH  *UNUSUAL BRUISING OR BLEEDING  TENDERNESS IN MOUTH AND THROAT WITH OR WITHOUT PRESENCE OF ULCERS  *URINARY PROBLEMS  *BOWEL PROBLEMS  UNUSUAL RASH Items with * indicate a potential emergency and should be followed up as soon as possible.  Feel free to call the clinic you have any questions or concerns. The clinic phone number is [phone].  Please show the CHEMO ALERT CARD at check-in to the Emergency Department and triage nurse.   

## 2015-09-20 MED FILL — ACYCLOVIR 400 MG TABLET: 400 | 30 days supply | Qty: 60 | Fill #2

## 2015-09-21 ENCOUNTER — Ambulatory Visit (HOSPITAL_BASED_OUTPATIENT_CLINIC_OR_DEPARTMENT_OTHER): Payer: Medicaid Other

## 2015-09-21 VITALS — BP 123/84 | HR 93 | Temp 98.5°F | Resp 17

## 2015-09-21 DIAGNOSIS — C9000 Multiple myeloma not having achieved remission: Secondary | ICD-10-CM | POA: Diagnosis not present

## 2015-09-21 DIAGNOSIS — Z5111 Encounter for antineoplastic chemotherapy: Secondary | ICD-10-CM | POA: Diagnosis present

## 2015-09-21 MED ORDER — PROCHLORPERAZINE MALEATE 10 MG PO TABS
10.0000 mg | ORAL_TABLET | Freq: Once | ORAL | Status: AC
  Administered 2015-09-21: 10 mg via ORAL

## 2015-09-21 MED ORDER — BORTEZOMIB CHEMO SQ INJECTION 3.5 MG (2.5MG/ML)
1.3000 mg/m2 | Freq: Once | INTRAMUSCULAR | Status: AC
  Administered 2015-09-21: 3 mg via SUBCUTANEOUS
  Filled 2015-09-21: qty 3

## 2015-09-21 MED ORDER — PROCHLORPERAZINE MALEATE 10 MG PO TABS
ORAL_TABLET | ORAL | Status: AC
  Filled 2015-09-21: qty 1

## 2015-09-21 NOTE — Patient Instructions (Signed)
[facility] Discharge Instructions for Patients Receiving Chemotherapy  Today you received the following chemotherapy agents:  Velcade  To help prevent nausea and vomiting after your treatment, we encourage you to take your nausea medication as ordered per MD.   If you develop nausea and vomiting that is not controlled by your nausea medication, call the clinic.   BELOW ARE SYMPTOMS THAT SHOULD BE REPORTED IMMEDIATELY:  *FEVER GREATER THAN 100.5 F  *CHILLS WITH OR WITHOUT FEVER  NAUSEA AND VOMITING THAT IS NOT CONTROLLED WITH YOUR NAUSEA MEDICATION  *UNUSUAL SHORTNESS OF BREATH  *UNUSUAL BRUISING OR BLEEDING  TENDERNESS IN MOUTH AND THROAT WITH OR WITHOUT PRESENCE OF ULCERS  *URINARY PROBLEMS  *BOWEL PROBLEMS  UNUSUAL RASH Items with * indicate a potential emergency and should be followed up as soon as possible.  Feel free to call the clinic you have any questions or concerns. The clinic phone number is [phone].  Please show the CHEMO ALERT CARD at check-in to the Emergency Department and triage nurse.   

## 2015-09-26 ENCOUNTER — Other Ambulatory Visit: Payer: Self-pay | Admitting: *Deleted

## 2015-09-26 MED ORDER — LENALIDOMIDE 10 MG PO CAPS: 10.0000 mg | ORAL_CAPSULE | Freq: Every day | ORAL | 0 refills | Status: DC

## 2015-09-29 ENCOUNTER — Other Ambulatory Visit: Payer: Self-pay | Admitting: *Deleted

## 2015-10-03 ENCOUNTER — Ambulatory Visit
Admission: RE | Admit: 2015-10-03 | Discharge: 2015-10-03 | Disposition: A | Discharge status: Home / Self Care | Payer: Medicaid Other | Source: Ambulatory Visit | Attending: Radiation Oncology | Admitting: Radiation Oncology

## 2015-10-03 ENCOUNTER — Encounter: Payer: Self-pay | Admitting: Radiation Oncology

## 2015-10-03 ENCOUNTER — Encounter: Payer: Self-pay | Admitting: *Deleted

## 2015-10-03 ENCOUNTER — Ambulatory Visit (HOSPITAL_BASED_OUTPATIENT_CLINIC_OR_DEPARTMENT_OTHER): Payer: Medicaid Other

## 2015-10-03 ENCOUNTER — Other Ambulatory Visit (HOSPITAL_BASED_OUTPATIENT_CLINIC_OR_DEPARTMENT_OTHER): Payer: Medicaid Other

## 2015-10-03 ENCOUNTER — Other Ambulatory Visit: Payer: Self-pay | Admitting: Hematology and Oncology

## 2015-10-03 VITALS — BP 137/89 | HR 82 | Temp 98.1°F | Ht 75.0 in | Wt 225.6 lb

## 2015-10-03 DIAGNOSIS — M5384 Other specified dorsopathies, thoracic region: Secondary | ICD-10-CM | POA: Diagnosis present

## 2015-10-03 DIAGNOSIS — Z5112 Encounter for antineoplastic immunotherapy: Secondary | ICD-10-CM

## 2015-10-03 DIAGNOSIS — C9000 Multiple myeloma not having achieved remission: Secondary | ICD-10-CM | POA: Insufficient documentation

## 2015-10-03 DIAGNOSIS — C7951 Secondary malignant neoplasm of bone: Secondary | ICD-10-CM

## 2015-10-03 DIAGNOSIS — G9529 Other cord compression: Secondary | ICD-10-CM

## 2015-10-03 LAB — CBC WITH DIFFERENTIAL/PLATELET
BASO%: 1.4 % (ref 0.0–2.0)
BASOS ABS: 0.1 10*3/uL (ref 0.0–0.1)
EOS ABS: 0.2 10*3/uL (ref 0.0–0.5)
EOS%: 2.9 % (ref 0.0–7.0)
HEMATOCRIT: 38.4 % (ref 38.4–49.9)
HEMOGLOBIN: 12.5 g/dL — AB (ref 13.0–17.1)
LYMPH#: 0.9 10*3/uL (ref 0.9–3.3)
LYMPH%: 15.8 % (ref 14.0–49.0)
MCH: 29.3 pg (ref 27.2–33.4)
MCHC: 32.6 g/dL (ref 32.0–36.0)
MCV: 90 fL (ref 79.3–98.0)
MONO#: 0.5 10*3/uL (ref 0.1–0.9)
MONO%: 8.1 % (ref 0.0–14.0)
NEUT#: 4 10*3/uL (ref 1.5–6.5)
NEUT%: 71.8 % (ref 39.0–75.0)
Platelets: 329 10*3/uL (ref 140–400)
RBC: 4.27 10*6/uL (ref 4.20–5.82)
RDW: 15 % — AB (ref 11.0–14.6)
WBC: 5.6 10*3/uL (ref 4.0–10.3)

## 2015-10-03 LAB — COMPREHENSIVE METABOLIC PANEL
ALBUMIN: 3.7 g/dL (ref 3.5–5.0)
ALK PHOS: 53 U/L (ref 40–150)
ALT: 16 U/L (ref 0–55)
AST: 16 U/L (ref 5–34)
Anion Gap: 9 mEq/L (ref 3–11)
BILIRUBIN TOTAL: 0.49 mg/dL (ref 0.20–1.20)
BUN: 14.2 mg/dL (ref 7.0–26.0)
CALCIUM: 9.6 mg/dL (ref 8.4–10.4)
CO2: 24 mEq/L (ref 22–29)
Chloride: 109 mEq/L (ref 98–109)
Creatinine: 1 mg/dL (ref 0.7–1.3)
EGFR: 88 mL/min/{1.73_m2} — AB (ref >90)
Glucose: 95 mg/dl (ref 70–140)
POTASSIUM: 4 meq/L (ref 3.5–5.1)
Sodium: 142 mEq/L (ref 136–145)
TOTAL PROTEIN: 6.8 g/dL (ref 6.4–8.3)

## 2015-10-03 MED ORDER — BORTEZOMIB CHEMO SQ INJECTION 3.5 MG (2.5MG/ML)
1.3000 mg/m2 | Freq: Once | INTRAMUSCULAR | Status: AC
  Administered 2015-10-03: 3 mg via SUBCUTANEOUS
  Filled 2015-10-03: qty 3

## 2015-10-03 MED ORDER — PROCHLORPERAZINE MALEATE 10 MG PO TABS
ORAL_TABLET | ORAL | Status: AC
  Filled 2015-10-03: qty 1

## 2015-10-03 MED ORDER — PROCHLORPERAZINE MALEATE 10 MG PO TABS
10.0000 mg | ORAL_TABLET | Freq: Once | ORAL | Status: AC
  Administered 2015-10-03: 10 mg via ORAL

## 2015-10-03 NOTE — Progress Notes (Addendum)
Bradley Hunt reports that he continues on Decadron 20 mg po once weekly. He reports tightness in abdomen. Intermittent heaviness in lower extremities and numbness of his feet. He reports napping during the day.   BP 137/89 (BP Location: Right Arm, Patient Position: Sitting, Cuff Size: Normal)   Pulse 82   Temp 98.1 F (36.7 C) (Oral)   Ht 6\' 3"  (1.905 m)   Wt 225 lb 9.6 oz (102.3 kg)   BMI 28.20 kg/m    Wt Readings from Last 3 Encounters:  10/03/15 225 lb 9.6 oz (102.3 kg)  09/11/15 230 lb (104.3 kg)  08/25/15 223 lb (101.2 kg)

## 2015-10-03 NOTE — Progress Notes (Signed)
[place] for Mr. Bachtell he had not signed in for today.  Asked at the front desk to be called and send Mr. Angle down to the Radiation oncology area.  Called his home and left a message there was not answer left a message for him to call back to [phone].

## 2015-10-03 NOTE — Progress Notes (Signed)
Radiation Oncology         [phone]) [phone] ________________________________  Name: Bradley Hunt MRN: [id number]  Date: 10/03/2015  DOB: [birth year]  Post Treatment Note  CC: Barbette Merino, MD  [name], MD  Diagnosis:   Stage IIA Multiple Myeloma not having achieved remission with disease in the thoracic spine.  Interval Since Last Radiation:  5 weeks   08/14/15-08/25/15: 25 Gy to the thoracic spine T8-11 in 10 fractions  Narrative:  The patient returns today for routine follow-up. The patient was found during the first week of treatment to have intermittent weakness and decreased sensation in his bilateral lower extremities and burning sensation along the T11 dermatome and hence was started on Dexamethasone 4 mg BID. He otherwise did well and has noticed pain improvement since completing treatment.                           On review of systems, the patient states he is doing well overall. He continues at times to have episodes of heaviness in his lower extremities and a pulling cramping sensation in his lower abdomen. He states these episodes are becoming less and less frequent. He is taking hydrocodone when this occurs, but he states that he is not hesitate pain medicine in a few days. He is pleased with this. He is in the process of being reassessed to go back to work, and continues with systemic therapy with Dr.Gorsuch. He denies any progressive back pain, loss of control of bowel or bladder function, no nurse tingling of his extremities. He denies any rectal pain. He is eating well and states normal bowel and bladder activity. No other complaints or verbalized.  ALLERGIES:  has No Known Allergies.  Meds: Current Outpatient Prescriptions  Medication Sig Dispense Refill  . acyclovir (ZOVIRAX) 400 MG tablet Take 1 tablet (400 mg total) by mouth 2 (two) times daily. 60 tablet 3  . cholecalciferol (VITAMIN D) 1000 units tablet Take 1,000 Units by mouth daily.    Marland Kitchen dexamethasone (DECADRON) 4  MG tablet Take 1 tablet (4 mg total) by mouth 2 (two) times daily. (Patient taking differently: Take 4 mg by mouth once a week. ) 60 tablet 1  . HYDROcodone-acetaminophen (NORCO/VICODIN) 5-325 MG tablet Take 1 tablet by mouth every 6 (six) hours as needed for moderate pain. 90 tablet 0  . lenalidomide (REVLIMID) 10 MG capsule Take 1 capsule (10 mg total) by mouth daily. For 14 days on and then 7 days off each cycle. (Patient not taking: Reported on 10/03/2015) 14 capsule 0  . ondansetron (ZOFRAN) 8 MG tablet Take 1 tablet (8 mg total) by mouth every 8 (eight) hours as needed (Nausea or vomiting). (Patient not taking: Reported on 10/03/2015) 30 tablet 1  . senna-docusate (SENOKOT-S) 8.6-50 MG tablet Take 1 tablet by mouth at bedtime as needed for mild constipation. (Patient not taking: Reported on 10/03/2015) 30 tablet 0   No current facility-administered medications for this encounter.     Physical Findings:  height is 6' 3"  (1.905 m) and weight is 225 lb 9.6 oz (102.3 kg). His oral temperature is 98.1 F (36.7 C). His blood pressure is 137/89 and his pulse is 82.  In general this is a well appearing African male in no acute distress. He's alert and oriented x4 and appropriate throughout the examination. Cardiopulmonary assessment is negative for acute distress and he exhibits normal effort. Is anterior and posterior skin is intact without evidence of desquamation.  A well-healed laminectomy scar is noted along the T5 through 6 region.  Lab Findings: Lab Results  Component Value Date   WBC 4.5 09/18/2015   HGB 12.4 (L) 09/18/2015   HCT 37.7 (L) 09/18/2015   MCV 93.1 09/18/2015   PLT 173 09/18/2015     Radiographic Findings: No results found.  Impression/Plan: 1. Stage IIA Multiple Myeloma not having achieved remission. The patient continues systemic therapy under the care of Dr. Alvy Bimler. We will plan to be available as needed moving forward for his care. 2. Thoracic metastases with symptoms of  intermittent cord compression. The patient has completed treatment and did not require any surgical decompression during treatment. He did well with radiotherapy and tolerated dexamethasone. Now that he is off daily dexamethasone, he will continue to be followed by Dr.Gorsuch. We will be happy to see him back in the future if he has additional needs for radiotherapy.     Carola Rhine, PAC

## 2015-10-03 NOTE — Patient Instructions (Signed)
[facility] Discharge Instructions for Patients Receiving Chemotherapy  Today you received the following chemotherapy agents:  Velcade  To help prevent nausea and vomiting after your treatment, we encourage you to take your nausea medication as ordered per MD.   If you develop nausea and vomiting that is not controlled by your nausea medication, call the clinic.   BELOW ARE SYMPTOMS THAT SHOULD BE REPORTED IMMEDIATELY:  *FEVER GREATER THAN 100.5 F  *CHILLS WITH OR WITHOUT FEVER  NAUSEA AND VOMITING THAT IS NOT CONTROLLED WITH YOUR NAUSEA MEDICATION  *UNUSUAL SHORTNESS OF BREATH  *UNUSUAL BRUISING OR BLEEDING  TENDERNESS IN MOUTH AND THROAT WITH OR WITHOUT PRESENCE OF ULCERS  *URINARY PROBLEMS  *BOWEL PROBLEMS  UNUSUAL RASH Items with * indicate a potential emergency and should be followed up as soon as possible.  Feel free to call the clinic you have any questions or concerns. The clinic phone number is [phone].  Please show the CHEMO ALERT CARD at check-in to the Emergency Department and triage nurse.   

## 2015-10-04 LAB — KAPPA/LAMBDA LIGHT CHAINS
IG KAPPA FREE LIGHT CHAIN: 8.7 mg/L (ref 3.3–19.4)
IG LAMBDA FREE LIGHT CHAIN: 170.1 mg/L — AB (ref 5.7–26.3)
KAPPA/LAMBDA FLC RATIO: 0.05 — AB (ref 0.26–1.65)

## 2015-10-04 NOTE — Progress Notes (Signed)
  Radiation Oncology         [phone] ________________________________  Name: Bradley Hunt MRN: [id number]  Date: 08/25/2015  DOB: [birth year]  End of Treatment Note  Diagnosis:   Multiple myeloma     Indication for treatment::  palliative       Radiation treatment dates:   08/14/2015 through 08/25/2015  Site/dose:   The patient was treated to the T5-T10 vertebral bodies. The patient was treated with a 2 field 3-D conformal technique.  Narrative: The patient tolerated radiation treatment relatively well.     Plan: The patient has completed radiation treatment. The patient will return to radiation oncology clinic for routine followup in one month. I advised the patient to call or return sooner if they have any questions or concerns related to their recovery or treatment. ________________________________  Jodelle Gross, M.D., Ph.D.

## 2015-10-06 ENCOUNTER — Ambulatory Visit (HOSPITAL_BASED_OUTPATIENT_CLINIC_OR_DEPARTMENT_OTHER): Payer: Medicaid Other

## 2015-10-06 ENCOUNTER — Other Ambulatory Visit: Payer: Self-pay

## 2015-10-06 VITALS — BP 131/89 | HR 80 | Temp 98.6°F | Resp 18

## 2015-10-06 DIAGNOSIS — C9000 Multiple myeloma not having achieved remission: Secondary | ICD-10-CM

## 2015-10-06 DIAGNOSIS — Z5112 Encounter for antineoplastic immunotherapy: Secondary | ICD-10-CM

## 2015-10-06 LAB — MULTIPLE MYELOMA PANEL, SERUM
Albumin SerPl Elph-Mcnc: 3.7 g/dL (ref 2.9–4.4)
Albumin/Glob SerPl: 1.5 (ref 0.7–1.7)
Alpha 1: 0.2 g/dL (ref 0.0–0.4)
Alpha2 Glob SerPl Elph-Mcnc: 0.8 g/dL (ref 0.4–1.0)
B-Globulin SerPl Elph-Mcnc: 1 g/dL (ref 0.7–1.3)
Gamma Glob SerPl Elph-Mcnc: 0.5 g/dL (ref 0.4–1.8)
Globulin, Total: 2.5 g/dL (ref 2.2–3.9)
IGA/IMMUNOGLOBULIN A, SERUM: 27 mg/dL — AB (ref 90–386)
IGG (IMMUNOGLOBIN G), SERUM: 563 mg/dL — AB (ref 700–1600)
IGM (IMMUNOGLOBIN M), SRM: 31 mg/dL (ref 20–172)
TOTAL PROTEIN: 6.2 g/dL (ref 6.0–8.5)

## 2015-10-06 MED ORDER — PROCHLORPERAZINE MALEATE 10 MG PO TABS
ORAL_TABLET | ORAL | Status: AC
Start: 2015-10-06 — End: 2015-10-06
  Filled 2015-10-06: qty 1

## 2015-10-06 MED ORDER — PROCHLORPERAZINE MALEATE 10 MG PO TABS
10.0000 mg | ORAL_TABLET | Freq: Once | ORAL | Status: AC
  Administered 2015-10-06: 10 mg via ORAL

## 2015-10-06 MED ORDER — BORTEZOMIB CHEMO SQ INJECTION 3.5 MG (2.5MG/ML)
1.3000 mg/m2 | Freq: Once | INTRAMUSCULAR | Status: AC
  Administered 2015-10-06: 3 mg via SUBCUTANEOUS
  Filled 2015-10-06: qty 3

## 2015-10-06 NOTE — Patient Instructions (Signed)
[facility] Discharge [facility] for Patients Receiving Chemotherapy  Today you received the following chemotherapy agents:  Velcade  To help prevent nausea and vomiting after your treatment, we encourage you to take your nausea medication as ordered per MD.   If you develop nausea and vomiting that is not controlled by your nausea medication, call the clinic.   BELOW ARE SYMPTOMS THAT SHOULD BE REPORTED IMMEDIATELY:  *FEVER GREATER THAN 100.5 F  *CHILLS WITH OR WITHOUT FEVER  NAUSEA AND VOMITING THAT IS NOT CONTROLLED WITH YOUR NAUSEA MEDICATION  *UNUSUAL SHORTNESS OF BREATH  *UNUSUAL BRUISING OR BLEEDING  TENDERNESS IN MOUTH AND THROAT WITH OR WITHOUT PRESENCE OF ULCERS  *URINARY PROBLEMS  *BOWEL PROBLEMS  UNUSUAL RASH Items with * indicate a potential emergency and should be followed up as soon as possible.  Feel free to call the clinic you have any questions or concerns. The clinic phone number is [phone].  Please show the CHEMO ALERT CARD at check-in to the Emergency Department and triage nurse.   

## 2015-10-10 ENCOUNTER — Ambulatory Visit (HOSPITAL_BASED_OUTPATIENT_CLINIC_OR_DEPARTMENT_OTHER): Payer: Medicaid Other

## 2015-10-10 ENCOUNTER — Ambulatory Visit (HOSPITAL_BASED_OUTPATIENT_CLINIC_OR_DEPARTMENT_OTHER): Payer: Medicaid Other | Admitting: Hematology and Oncology

## 2015-10-10 ENCOUNTER — Encounter: Payer: Self-pay | Admitting: Hematology and Oncology

## 2015-10-10 ENCOUNTER — Other Ambulatory Visit (HOSPITAL_BASED_OUTPATIENT_CLINIC_OR_DEPARTMENT_OTHER): Payer: Medicaid Other

## 2015-10-10 ENCOUNTER — Telehealth: Payer: Self-pay | Admitting: *Deleted

## 2015-10-10 VITALS — BP 132/81 | HR 69 | Temp 98.4°F | Resp 18 | Wt 225.1 lb

## 2015-10-10 DIAGNOSIS — C7951 Secondary malignant neoplasm of bone: Secondary | ICD-10-CM

## 2015-10-10 DIAGNOSIS — G9520 Unspecified cord compression: Secondary | ICD-10-CM | POA: Diagnosis not present

## 2015-10-10 DIAGNOSIS — D72819 Decreased white blood cell count, unspecified: Secondary | ICD-10-CM | POA: Diagnosis not present

## 2015-10-10 DIAGNOSIS — G893 Neoplasm related pain (acute) (chronic): Secondary | ICD-10-CM | POA: Diagnosis not present

## 2015-10-10 DIAGNOSIS — Z5112 Encounter for antineoplastic immunotherapy: Secondary | ICD-10-CM | POA: Diagnosis present

## 2015-10-10 DIAGNOSIS — G9529 Other cord compression: Secondary | ICD-10-CM

## 2015-10-10 DIAGNOSIS — C9000 Multiple myeloma not having achieved remission: Secondary | ICD-10-CM

## 2015-10-10 DIAGNOSIS — D702 Other drug-induced agranulocytosis: Secondary | ICD-10-CM | POA: Insufficient documentation

## 2015-10-10 LAB — CBC WITH DIFFERENTIAL/PLATELET
BASO%: 1.2 % (ref 0.0–2.0)
Basophils Absolute: 0 10*3/uL (ref 0.0–0.1)
EOS ABS: 0.1 10*3/uL (ref 0.0–0.5)
EOS%: 2.3 % (ref 0.0–7.0)
HCT: 40.1 % (ref 38.4–49.9)
HEMOGLOBIN: 13.3 g/dL (ref 13.0–17.1)
LYMPH%: 30.6 % (ref 14.0–49.0)
MCH: 29.8 pg (ref 27.2–33.4)
MCHC: 33.1 g/dL (ref 32.0–36.0)
MCV: 90 fL (ref 79.3–98.0)
MONO#: 0.4 10*3/uL (ref 0.1–0.9)
MONO%: 11.8 % (ref 0.0–14.0)
NEUT%: 54.1 % (ref 39.0–75.0)
NEUTROS ABS: 1.9 10*3/uL (ref 1.5–6.5)
PLATELETS: 224 10*3/uL (ref 140–400)
RBC: 4.46 10*6/uL (ref 4.20–5.82)
RDW: 15 % — AB (ref 11.0–14.6)
WBC: 3.5 10*3/uL — AB (ref 4.0–10.3)
lymph#: 1.1 10*3/uL (ref 0.9–3.3)

## 2015-10-10 LAB — COMPREHENSIVE METABOLIC PANEL
ALBUMIN: 3.6 g/dL (ref 3.5–5.0)
ALK PHOS: 55 U/L (ref 40–150)
ALT: 23 U/L (ref 0–55)
AST: 17 U/L (ref 5–34)
Anion Gap: 8 mEq/L (ref 3–11)
BILIRUBIN TOTAL: 0.57 mg/dL (ref 0.20–1.20)
BUN: 9.2 mg/dL (ref 7.0–26.0)
CO2: 25 mEq/L (ref 22–29)
CREATININE: 1.1 mg/dL (ref 0.7–1.3)
Calcium: 9.2 mg/dL (ref 8.4–10.4)
Chloride: 110 mEq/L — ABNORMAL HIGH (ref 98–109)
EGFR: 83 mL/min/{1.73_m2} — AB (ref >90)
GLUCOSE: 89 mg/dL (ref 70–140)
Potassium: 3.7 mEq/L (ref 3.5–5.1)
SODIUM: 143 meq/L (ref 136–145)
TOTAL PROTEIN: 6.3 g/dL — AB (ref 6.4–8.3)

## 2015-10-10 MED ORDER — PROCHLORPERAZINE MALEATE 10 MG PO TABS
10.0000 mg | ORAL_TABLET | Freq: Once | ORAL | Status: AC
  Administered 2015-10-10: 10 mg via ORAL

## 2015-10-10 MED ORDER — BORTEZOMIB CHEMO SQ INJECTION 3.5 MG (2.5MG/ML)
1.3000 mg/m2 | Freq: Once | INTRAMUSCULAR | Status: AC
  Administered 2015-10-10: 3 mg via SUBCUTANEOUS
  Filled 2015-10-10: qty 3

## 2015-10-10 MED ORDER — PROCHLORPERAZINE MALEATE 10 MG PO TABS
ORAL_TABLET | ORAL | Status: AC
  Filled 2015-10-10: qty 1

## 2015-10-10 NOTE — Assessment & Plan Note (Signed)
This is likely due to recent treatment. The patient denies recent history of fevers, cough, chills, diarrhea or dysuria. He is asymptomatic from the leukopenia. I will observe for now.  I will continue the chemotherapy at current dose without dosage adjustment.  If the leukopenia gets progressive worse in the future, I might have to delay his treatment or adjust the chemotherapy dose. 

## 2015-10-10 NOTE — Assessment & Plan Note (Signed)
He has completed palliative radiation therapy. Clinically, he is relatively asymptomatic apart from sensation of tightness. He has some altered sensation down his legs but nothing to suggest imminent cord compression. He has no appreciable weakness in both lower extremities I will start to reduce dexamethasone to pulsed dose 16 mg once a week

## 2015-10-10 NOTE — Assessment & Plan Note (Signed)
He has completed recent radiation treatment. He tolerates chemotherapy well without major side effects. Recent myeloma panel showed improved disease control. However, the patient misunderstood instruction and did not refilled Revlimid recently I reinforced the importance of him taking Revlimid, weekly dexamethasone and Velcade as scheduled He will complete cycle 4 treatment this month and I will recheck myeloma panel again before I see him back next month. I'm not able to refer him for bone marrow transplant evaluation until the patient has obtained insurance coverage. He will continue calcium with vitamin D He will take aspirin for DVT prophylaxis He has not been prescribed Zometa due to inability to get dental clearance due to lack of insurance I will reduce dexamethasone to 16 mg once a week on Tuesdays

## 2015-10-10 NOTE — Assessment & Plan Note (Signed)
He has cancer associated pain. This is described as tightness around his waist This is improving I refill his prescription today and we discussed narcotic refill policy

## 2015-10-10 NOTE — Telephone Encounter (Signed)
Per Dr. Alvy Bimler pt had stopped taking his Revlimid, but she did not want him to stop it.  He needs to resume as soon as possible.  He will need refill.  Last refill was sent on 8/29 to [facility].  Called pharmacy to check status of Revlimid refill.  S/w Glenard Haring who says she will need to have someone from their "[facility]" call Nurse back about Revlimid.  I gave her my direct phone number.

## 2015-10-10 NOTE — Progress Notes (Signed)
[facility] OFFICE PROGRESS NOTE  Patient Care Team: [name], MD as PCP - General (Internal Medicine)  SUMMARY OF ONCOLOGIC HISTORY:   Multiple myeloma not having achieved remission (English)   06/23/2015 - 06/28/2015 Hospital Admission    The patient was admitted to the hospital due to gait ataxia and back pain. He was subsequently found to have cord compression underwent surgery and was discharged home      06/24/2015 Imaging    Abnormal appearance of the T6 vertebral body, highly suspicious for possible osseous metastasis. Associated pathologic fracture withup to 30% height loss. There is associated abnormal soft tissue density within the ventral epidural space,      06/24/2015 Imaging    MRI lumbar: Focal osseous lesion with abnormal enhancement involving the right pedicle of L3, suspicious for possible osseous metastasisgiven the findings in the thoracic spine. Question additional focal lesion within the right iliac wing as above.        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 He is seen prior to treatment. He continues to have intermittent chest tightness around his waist radiating down to the legs. It is stable. He takes medicine periodically. He denies neuropathy. No nausea or vomiting. He recently felt he might have low-grade fever but he did not document his temperature. He is noncompliant and has not refilled his prescription Revlimid. He spoke with his caseworker recently and he still does not have  medical insurance despite filling up paperwork  REVIEW OF SYSTEMS:   Eyes: Denies blurriness of vision Ears, nose, mouth, throat, and face: Denies mucositis or sore throat Respiratory: Denies cough, dyspnea or wheezes Cardiovascular: Denies palpitation, chest discomfort or lower extremity swelling Gastrointestinal:  Denies nausea, heartburn or change in bowel habits Skin: Denies abnormal skin rashes Lymphatics: Denies new lymphadenopathy or easy bruising Neurological:Denies numbness, tingling or new weaknesses Behavioral/Psych: Mood is stable, no new changes  All other systems were reviewed with the patient and are negative.  I have reviewed the past medical history, past surgical history, social history and family history with the patient and they are unchanged from previous note.  ALLERGIES:  has No Known Allergies.  MEDICATIONS:  Current Outpatient Prescriptions  Medication Sig Dispense Refill  . acyclovir (ZOVIRAX) 400 MG tablet Take 1 tablet (400 mg total) by mouth 2 (two) times daily. 60 tablet 3  . cholecalciferol (VITAMIN D) 1000 units tablet Take 1,000 Units by mouth daily.    Marland Kitchen dexamethasone (DECADRON) 4 MG tablet Take 1 tablet (4 mg total) by mouth 2 (two) times daily. (Patient taking differently: Take 4 mg by mouth once a week. ) 60 tablet 1  . HYDROcodone-acetaminophen (NORCO/VICODIN) 5-325 MG tablet Take 1 tablet by mouth every 6 (six) hours as needed for moderate pain. 90 tablet 0  . ondansetron (ZOFRAN) 8 MG tablet Take 1 tablet (8 mg total) by mouth every 8 (eight) hours as needed (Nausea or vomiting). 30 tablet 1  . senna-docusate (SENOKOT-S) 8.6-50  MG tablet Take 1 tablet by mouth at bedtime as needed for mild constipation. 30 tablet 0  . lenalidomide (REVLIMID) 10 MG capsule Take 1 capsule (10 mg total) by mouth daily. For 14 days on and then 7 days off each cycle. (Patient not taking: Reported on 10/10/2015) 14 capsule 0   No current facility-administered medications  for this visit.     PHYSICAL EXAMINATION: ECOG PERFORMANCE STATUS: 1 - Symptomatic but completely ambulatory  Vitals:   10/10/15 1013  BP: 132/81  Pulse: 69  Resp: 18  Temp: 98.4 F (36.9 C)   Filed Weights   10/10/15 1013  Weight: 225 lb 1.6 oz (102.1 kg)    GENERAL:alert, no distress and comfortable SKIN: skin color, texture, turgor are normal, no rashes or significant lesions EYES: normal, Conjunctiva are pink and non-injected, sclera clear OROPHARYNX:no exudate, no erythema and lips, buccal mucosa, and tongue normal  NECK: supple, thyroid normal size, non-tender, without nodularity LYMPH:  no palpable lymphadenopathy in the cervical, axillary or inguinal LUNGS: clear to auscultation and percussion with normal breathing effort HEART: regular rate & rhythm and no murmurs and no lower extremity edema ABDOMEN:abdomen soft, non-tender and normal bowel sounds Musculoskeletal:no cyanosis of digits and no clubbing  NEURO: alert & oriented x 3 with fluent speech, no focal motor/sensory deficits  LABORATORY DATA:  I have reviewed the data as listed    Component Value Date/Time   NA 143 10/10/2015 0953   K 3.7 10/10/2015 0953   CL 110 07/20/2015 0755   CO2 25 10/10/2015 0953   GLUCOSE 89 10/10/2015 0953   BUN 9.2 10/10/2015 0953   CREATININE 1.1 10/10/2015 0953   CALCIUM 9.2 10/10/2015 0953   PROT 6.3 (L) 10/10/2015 0953   ALBUMIN 3.6 10/10/2015 0953   AST 17 10/10/2015 0953   ALT 23 10/10/2015 0953   ALKPHOS 55 10/10/2015 0953   BILITOT 0.57 10/10/2015 0953   GFRNONAA 49 (L) 07/20/2015 0755   GFRAA 56 (L) 07/20/2015 0755    No results found for: SPEP, UPEP  Lab Results  Component Value Date   WBC 3.5 (L) 10/10/2015   NEUTROABS 1.9 10/10/2015   HGB 13.3 10/10/2015   HCT 40.1 10/10/2015   MCV 90.0 10/10/2015   PLT 224 10/10/2015      Chemistry      Component Value Date/Time   NA 143 10/10/2015 0953   K 3.7 10/10/2015 0953   CL 110 07/20/2015 0755   CO2  25 10/10/2015 0953   BUN 9.2 10/10/2015 0953   CREATININE 1.1 10/10/2015 0953      Component Value Date/Time   CALCIUM 9.2 10/10/2015 0953   ALKPHOS 55 10/10/2015 0953   AST 17 10/10/2015 0953   ALT 23 10/10/2015 0953   BILITOT 0.57 10/10/2015 0953     ASSESSMENT & PLAN:  Multiple myeloma not having achieved remission ([place]) He has completed recent radiation treatment. He tolerates chemotherapy well without major side effects. Recent myeloma panel showed improved disease control. However, the patient misunderstood instruction and did not refilled Revlimid recently I reinforced the importance of him taking Revlimid, weekly dexamethasone and Velcade as scheduled He will complete cycle 4 treatment this month and I will recheck myeloma panel again before I see him back next month. I'm not able to refer him for bone marrow transplant evaluation until the patient has obtained insurance coverage. He will continue calcium with vitamin D He will take aspirin for DVT prophylaxis He has not been prescribed Zometa due  to inability to get dental clearance due to lack of insurance I will reduce dexamethasone to 16 mg once a week on Tuesdays  Cancer associated pain He has cancer associated pain. 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 Clinically, he is relatively asymptomatic apart from sensation of tightness. He has some altered sensation down his legs but nothing to suggest imminent cord compression. He has no appreciable weakness in both lower extremities I will start to reduce dexamethasone to pulsed dose 16 mg once a week  Drug-induced leukopenia (HCC) This is likely due to recent treatment. The patient denies recent history of fevers, cough, chills, diarrhea or dysuria.  He is asymptomatic from the leukopenia. I will observe for now.  I will continue the chemotherapy at current dose without dosage adjustment.  If the leukopenia gets progressive worse in the future, I might have to delay his treatment or adjust the chemotherapy dose.     Orders Placed This Encounter  Procedures  . Kappa/lambda light chains    Standing Status:   Future    Standing Expiration Date:   11/13/2016  . Multiple Myeloma Panel (SPEP&IFE w/QIG)    Standing Status:   Future    Standing Expiration Date:   11/13/2016   All questions were answered. The patient knows to call the clinic with any problems, questions or concerns. No barriers to learning was detected. I spent 25 minutes counseling the patient face to face. The total time spent in the appointment was 30 minutes and more than 50% was on counseling and review of test results     [facility], [place], MD 10/10/2015 1:43 PM

## 2015-10-10 NOTE — Patient Instructions (Signed)
[facility] Discharge Instructions for Patients Receiving Chemotherapy  Today you received the following chemotherapy agents Velcade. To help prevent nausea and vomiting after your treatment, we encourage you to take your nausea medication as directed.  If you develop nausea and vomiting that is not controlled by your nausea medication, call the clinic.   BELOW ARE SYMPTOMS THAT SHOULD BE REPORTED IMMEDIATELY:  *FEVER GREATER THAN 100.5 F  *CHILLS WITH OR WITHOUT FEVER  NAUSEA AND VOMITING THAT IS NOT CONTROLLED WITH YOUR NAUSEA MEDICATION  *UNUSUAL SHORTNESS OF BREATH  *UNUSUAL BRUISING OR BLEEDING  TENDERNESS IN MOUTH AND THROAT WITH OR WITHOUT PRESENCE OF ULCERS  *URINARY PROBLEMS  *BOWEL PROBLEMS  UNUSUAL RASH Items with * indicate a potential emergency and should be followed up as soon as possible.  Feel free to call the clinic you have any questions or concerns. The clinic phone number is [phone].  Please show the CHEMO ALERT CARD at check-in to the Emergency Department and triage nurse.    

## 2015-10-11 MED ORDER — LENALIDOMIDE 10 MG PO CAPS: 10.0000 mg | ORAL_CAPSULE | Freq: Every day | ORAL | 0 refills | Status: DC

## 2015-10-11 NOTE — Telephone Encounter (Signed)
[place] [facility] again to f/u on Rx for Revlimid.  S/w Bradley Hunt.  He says he does not see Rx was received by them.  Informed him it was sent electronically on 8/29 and it is noted in our system as "receipt confirmed by pharmacy" same day.  Bradley Hunt says that was in the "midst of their transisiton" and he cannot see it.  He suggests we resend Rx.  I resent Revlimid electronically and will f/u tomorrow to make sure they got it.

## 2015-10-12 MED FILL — DEXAMETHASONE 4 MG TABLET: 4 | 30 days supply | Qty: 60 | Fill #1

## 2015-10-13 ENCOUNTER — Telehealth: Payer: Self-pay | Admitting: *Deleted

## 2015-10-13 ENCOUNTER — Other Ambulatory Visit: Payer: Self-pay

## 2015-10-13 ENCOUNTER — Telehealth: Payer: Self-pay | Admitting: Hematology and Oncology

## 2015-10-13 ENCOUNTER — Ambulatory Visit (HOSPITAL_BASED_OUTPATIENT_CLINIC_OR_DEPARTMENT_OTHER): Payer: Medicaid Other

## 2015-10-13 VITALS — BP 138/92 | HR 72 | Temp 97.7°F | Resp 18

## 2015-10-13 DIAGNOSIS — C9000 Multiple myeloma not having achieved remission: Secondary | ICD-10-CM

## 2015-10-13 DIAGNOSIS — Z5112 Encounter for antineoplastic immunotherapy: Secondary | ICD-10-CM

## 2015-10-13 MED ORDER — PROCHLORPERAZINE MALEATE 10 MG PO TABS
10.0000 mg | ORAL_TABLET | Freq: Once | ORAL | Status: AC
  Administered 2015-10-13: 10 mg via ORAL

## 2015-10-13 MED ORDER — BORTEZOMIB CHEMO SQ INJECTION 3.5 MG (2.5MG/ML)
1.3000 mg/m2 | Freq: Once | INTRAMUSCULAR | Status: AC
  Administered 2015-10-13: 3 mg via SUBCUTANEOUS
  Filled 2015-10-13: qty 3

## 2015-10-13 MED ORDER — PROCHLORPERAZINE MALEATE 10 MG PO TABS
ORAL_TABLET | ORAL | Status: AC
  Filled 2015-10-13: qty 1

## 2015-10-13 NOTE — Telephone Encounter (Signed)
[facility] to f/u on Rx sent for Revlimid.   I was given a alternate phone number for the team that deals w/ Revlimid as [phone].  S/w Fara Olden and he confirmed they received Rx for Revlimid and it should be shipped out on Monday for delivery on Tuesday.  They will be contacting pt to arrange delivery.

## 2015-10-13 NOTE — Telephone Encounter (Signed)
Per the LOS I have scheduled appts and notified the scheduler 

## 2015-10-13 NOTE — Telephone Encounter (Signed)
Avs report and appt schd given per 10/10/15 los.

## 2015-10-13 NOTE — Patient Instructions (Signed)
[facility] Discharge Instructions for Patients Receiving Chemotherapy  Today you received the following chemotherapy agents Velcade. To help prevent nausea and vomiting after your treatment, we encourage you to take your nausea medication as directed.  If you develop nausea and vomiting that is not controlled by your nausea medication, call the clinic.   BELOW ARE SYMPTOMS THAT SHOULD BE REPORTED IMMEDIATELY:  *FEVER GREATER THAN 100.5 F  *CHILLS WITH OR WITHOUT FEVER  NAUSEA AND VOMITING THAT IS NOT CONTROLLED WITH YOUR NAUSEA MEDICATION  *UNUSUAL SHORTNESS OF BREATH  *UNUSUAL BRUISING OR BLEEDING  TENDERNESS IN MOUTH AND THROAT WITH OR WITHOUT PRESENCE OF ULCERS  *URINARY PROBLEMS  *BOWEL PROBLEMS  UNUSUAL RASH Items with * indicate a potential emergency and should be followed up as soon as possible.  Feel free to call the clinic you have any questions or concerns. The clinic phone number is [phone].  Please show the CHEMO ALERT CARD at check-in to the Emergency Department and triage nurse.    

## 2015-10-13 NOTE — Progress Notes (Signed)
Patient has compazine as pre-med.  He states it does not make him sleepy and he is fine to drive.

## 2015-10-19 MED FILL — ACYCLOVIR 400 MG TABLET: 400 | 30 days supply | Qty: 60 | Fill #3

## 2015-10-24 ENCOUNTER — Other Ambulatory Visit (HOSPITAL_BASED_OUTPATIENT_CLINIC_OR_DEPARTMENT_OTHER): Payer: Medicaid Other

## 2015-10-24 ENCOUNTER — Ambulatory Visit (HOSPITAL_BASED_OUTPATIENT_CLINIC_OR_DEPARTMENT_OTHER): Payer: Medicaid Other

## 2015-10-24 VITALS — BP 145/84 | HR 70 | Temp 97.8°F | Resp 18

## 2015-10-24 DIAGNOSIS — C9000 Multiple myeloma not having achieved remission: Secondary | ICD-10-CM

## 2015-10-24 DIAGNOSIS — Z5112 Encounter for antineoplastic immunotherapy: Secondary | ICD-10-CM | POA: Diagnosis present

## 2015-10-24 LAB — COMPREHENSIVE METABOLIC PANEL
ALBUMIN: 3.8 g/dL (ref 3.5–5.0)
ALK PHOS: 58 U/L (ref 40–150)
ALT: 16 U/L (ref 0–55)
AST: 15 U/L (ref 5–34)
Anion Gap: 9 mEq/L (ref 3–11)
BILIRUBIN TOTAL: 0.49 mg/dL (ref 0.20–1.20)
BUN: 9.9 mg/dL (ref 7.0–26.0)
CALCIUM: 9.2 mg/dL (ref 8.4–10.4)
CO2: 26 mEq/L (ref 22–29)
CREATININE: 1 mg/dL (ref 0.7–1.3)
Chloride: 108 mEq/L (ref 98–109)
EGFR: >90 mL/min/{1.73_m2} (ref >90)
GLUCOSE: 83 mg/dL (ref 70–140)
POTASSIUM: 3.8 meq/L (ref 3.5–5.1)
SODIUM: 143 meq/L (ref 136–145)
TOTAL PROTEIN: 6.5 g/dL (ref 6.4–8.3)

## 2015-10-24 LAB — CBC WITH DIFFERENTIAL/PLATELET
BASO%: 0.7 % (ref 0.0–2.0)
BASOS ABS: 0 10*3/uL (ref 0.0–0.1)
EOS%: 7.4 % — AB (ref 0.0–7.0)
Eosinophils Absolute: 0.3 10*3/uL (ref 0.0–0.5)
HEMATOCRIT: 37.4 % — AB (ref 38.4–49.9)
HEMOGLOBIN: 12.6 g/dL — AB (ref 13.0–17.1)
LYMPH#: 1.4 10*3/uL (ref 0.9–3.3)
LYMPH%: 29.5 % (ref 14.0–49.0)
MCH: 29.6 pg (ref 27.2–33.4)
MCHC: 33.7 g/dL (ref 32.0–36.0)
MCV: 88 fL (ref 79.3–98.0)
MONO#: 0.5 10*3/uL (ref 0.1–0.9)
MONO%: 10 % (ref 0.0–14.0)
NEUT%: 52.4 % (ref 39.0–75.0)
NEUTROS ABS: 2.4 10*3/uL (ref 1.5–6.5)
Platelets: 224 10*3/uL (ref 140–400)
RBC: 4.25 10*6/uL (ref 4.20–5.82)
RDW: 14 % (ref 11.0–14.6)
WBC: 4.6 10*3/uL (ref 4.0–10.3)

## 2015-10-24 MED ORDER — PROCHLORPERAZINE MALEATE 10 MG PO TABS
ORAL_TABLET | ORAL | Status: AC
  Filled 2015-10-24: qty 1

## 2015-10-24 MED ORDER — BORTEZOMIB CHEMO SQ INJECTION 3.5 MG (2.5MG/ML)
1.3000 mg/m2 | Freq: Once | INTRAMUSCULAR | Status: AC
  Administered 2015-10-24: 3 mg via SUBCUTANEOUS
  Filled 2015-10-24: qty 3

## 2015-10-24 MED ORDER — PROCHLORPERAZINE MALEATE 10 MG PO TABS
10.0000 mg | ORAL_TABLET | Freq: Once | ORAL | Status: AC
  Administered 2015-10-24: 10 mg via ORAL

## 2015-10-24 NOTE — Patient Instructions (Signed)
[facility] Discharge Instructions for Patients Receiving Chemotherapy  Today you received the following chemotherapy agents, Velcade  To help prevent nausea and vomiting after your treatment, we encourage you to take your nausea medication as directed.   If you develop nausea and vomiting that is not controlled by your nausea medication, call the clinic.   BELOW ARE SYMPTOMS THAT SHOULD BE REPORTED IMMEDIATELY:  *FEVER GREATER THAN 100.5 F  *CHILLS WITH OR WITHOUT FEVER  NAUSEA AND VOMITING THAT IS NOT CONTROLLED WITH YOUR NAUSEA MEDICATION  *UNUSUAL SHORTNESS OF BREATH  *UNUSUAL BRUISING OR BLEEDING  TENDERNESS IN MOUTH AND THROAT WITH OR WITHOUT PRESENCE OF ULCERS  *URINARY PROBLEMS  *BOWEL PROBLEMS  UNUSUAL RASH Items with * indicate a potential emergency and should be followed up as soon as possible.  Feel free to call the clinic you have any questions or concerns. The clinic phone number is [phone].  Please show the [place] at check-in to the Emergency Department and triage nurse.

## 2015-10-25 MED FILL — OMEPRAZOLE DR 20 MG CAPSULE: 20 | 30 days supply | Qty: 30 | Fill #1

## 2015-10-27 ENCOUNTER — Ambulatory Visit (HOSPITAL_BASED_OUTPATIENT_CLINIC_OR_DEPARTMENT_OTHER): Payer: Medicaid Other

## 2015-10-27 VITALS — BP 115/72 | HR 86 | Temp 98.7°F | Resp 18

## 2015-10-27 DIAGNOSIS — Z5112 Encounter for antineoplastic immunotherapy: Secondary | ICD-10-CM

## 2015-10-27 DIAGNOSIS — C9000 Multiple myeloma not having achieved remission: Secondary | ICD-10-CM

## 2015-10-27 MED ORDER — PROCHLORPERAZINE MALEATE 10 MG PO TABS
10.0000 mg | ORAL_TABLET | Freq: Once | ORAL | Status: AC
  Administered 2015-10-27: 10 mg via ORAL

## 2015-10-27 MED ORDER — PROCHLORPERAZINE MALEATE 10 MG PO TABS
ORAL_TABLET | ORAL | Status: AC
  Filled 2015-10-27: qty 1

## 2015-10-27 MED ORDER — BORTEZOMIB CHEMO SQ INJECTION 3.5 MG (2.5MG/ML)
1.3000 mg/m2 | Freq: Once | INTRAMUSCULAR | Status: AC
  Administered 2015-10-27: 3 mg via SUBCUTANEOUS
  Filled 2015-10-27: qty 3

## 2015-10-27 NOTE — Patient Instructions (Signed)
[facility] Discharge Instructions for Patients Receiving Chemotherapy  Today you received the following chemotherapy agents Velcade. To help prevent nausea and vomiting after your treatment, we encourage you to take your nausea medication as directed.  If you develop nausea and vomiting that is not controlled by your nausea medication, call the clinic.   BELOW ARE SYMPTOMS THAT SHOULD BE REPORTED IMMEDIATELY:  *FEVER GREATER THAN 100.5 F  *CHILLS WITH OR WITHOUT FEVER  NAUSEA AND VOMITING THAT IS NOT CONTROLLED WITH YOUR NAUSEA MEDICATION  *UNUSUAL SHORTNESS OF BREATH  *UNUSUAL BRUISING OR BLEEDING  TENDERNESS IN MOUTH AND THROAT WITH OR WITHOUT PRESENCE OF ULCERS  *URINARY PROBLEMS  *BOWEL PROBLEMS  UNUSUAL RASH Items with * indicate a potential emergency and should be followed up as soon as possible.  Feel free to call the clinic you have any questions or concerns. The clinic phone number is [phone].  Please show the CHEMO ALERT CARD at check-in to the Emergency Department and triage nurse.    

## 2015-10-31 ENCOUNTER — Other Ambulatory Visit: Payer: Self-pay | Admitting: *Deleted

## 2015-10-31 ENCOUNTER — Ambulatory Visit (HOSPITAL_BASED_OUTPATIENT_CLINIC_OR_DEPARTMENT_OTHER): Payer: Medicaid Other

## 2015-10-31 ENCOUNTER — Encounter: Payer: Self-pay | Admitting: *Deleted

## 2015-10-31 ENCOUNTER — Other Ambulatory Visit (HOSPITAL_BASED_OUTPATIENT_CLINIC_OR_DEPARTMENT_OTHER): Payer: Medicaid Other

## 2015-10-31 VITALS — BP 140/80 | HR 85 | Temp 98.1°F | Resp 18

## 2015-10-31 DIAGNOSIS — Z5112 Encounter for antineoplastic immunotherapy: Secondary | ICD-10-CM | POA: Diagnosis not present

## 2015-10-31 DIAGNOSIS — C9000 Multiple myeloma not having achieved remission: Secondary | ICD-10-CM | POA: Diagnosis not present

## 2015-10-31 LAB — COMPREHENSIVE METABOLIC PANEL
ALK PHOS: 54 U/L (ref 40–150)
ALT: 22 U/L (ref 0–55)
ANION GAP: 9 meq/L (ref 3–11)
AST: 16 U/L (ref 5–34)
Albumin: 3.7 g/dL (ref 3.5–5.0)
BILIRUBIN TOTAL: 0.6 mg/dL (ref 0.20–1.20)
BUN: 10.3 mg/dL (ref 7.0–26.0)
CO2: 26 meq/L (ref 22–29)
Calcium: 9.2 mg/dL (ref 8.4–10.4)
Chloride: 107 mEq/L (ref 98–109)
Creatinine: 1 mg/dL (ref 0.7–1.3)
EGFR: 88 mL/min/{1.73_m2} — AB (ref >90)
GLUCOSE: 79 mg/dL (ref 70–140)
POTASSIUM: 3.7 meq/L (ref 3.5–5.1)
SODIUM: 142 meq/L (ref 136–145)
TOTAL PROTEIN: 6.3 g/dL — AB (ref 6.4–8.3)

## 2015-10-31 LAB — CBC WITH DIFFERENTIAL/PLATELET
BASO%: 0.5 % (ref 0.0–2.0)
Basophils Absolute: 0 10*3/uL (ref 0.0–0.1)
EOS ABS: 0.2 10*3/uL (ref 0.0–0.5)
EOS%: 5.1 % (ref 0.0–7.0)
HCT: 39 % (ref 38.4–49.9)
HGB: 12.7 g/dL — ABNORMAL LOW (ref 13.0–17.1)
LYMPH%: 19.2 % (ref 14.0–49.0)
MCH: 29.2 pg (ref 27.2–33.4)
MCHC: 32.7 g/dL (ref 32.0–36.0)
MCV: 89.3 fL (ref 79.3–98.0)
MONO#: 0.5 10*3/uL (ref 0.1–0.9)
MONO%: 12 % (ref 0.0–14.0)
NEUT%: 63.2 % (ref 39.0–75.0)
NEUTROS ABS: 2.5 10*3/uL (ref 1.5–6.5)
PLATELETS: 181 10*3/uL (ref 140–400)
RBC: 4.36 10*6/uL (ref 4.20–5.82)
RDW: 15.1 % — ABNORMAL HIGH (ref 11.0–14.6)
WBC: 4 10*3/uL (ref 4.0–10.3)
lymph#: 0.8 10*3/uL — ABNORMAL LOW (ref 0.9–3.3)

## 2015-10-31 MED ORDER — BORTEZOMIB CHEMO SQ INJECTION 3.5 MG (2.5MG/ML)
1.3000 mg/m2 | Freq: Once | INTRAMUSCULAR | Status: AC
  Administered 2015-10-31: 3 mg via SUBCUTANEOUS
  Filled 2015-10-31: qty 3

## 2015-10-31 MED ORDER — PROCHLORPERAZINE MALEATE 10 MG PO TABS
ORAL_TABLET | ORAL | Status: AC
  Filled 2015-10-31: qty 1

## 2015-10-31 MED ORDER — PROCHLORPERAZINE MALEATE 10 MG PO TABS
10.0000 mg | ORAL_TABLET | Freq: Once | ORAL | Status: AC
  Administered 2015-10-31: 10 mg via ORAL

## 2015-10-31 NOTE — Progress Notes (Signed)
Patient complaining of mild constipation that is not being resolved with Sennacot-S.  Per Tammi, RN instructed to advise patient to also add Miralax daily and continue to take the Sennacot per box instructions and to increase fluid intake.  Also complained of some memory loss that only happened for a brief period of time and resolved on its own.  No concerns or worries at this point.

## 2015-10-31 NOTE — Patient Instructions (Signed)
[facility] Discharge Instructions for Patients Receiving Chemotherapy  Today you received the following chemotherapy agents Velcade. To help prevent nausea and vomiting after your treatment, we encourage you to take your nausea medication as directed.  If you develop nausea and vomiting that is not controlled by your nausea medication, call the clinic.   BELOW ARE SYMPTOMS THAT SHOULD BE REPORTED IMMEDIATELY:  *FEVER GREATER THAN 100.5 F  *CHILLS WITH OR WITHOUT FEVER  NAUSEA AND VOMITING THAT IS NOT CONTROLLED WITH YOUR NAUSEA MEDICATION  *UNUSUAL SHORTNESS OF BREATH  *UNUSUAL BRUISING OR BLEEDING  TENDERNESS IN MOUTH AND THROAT WITH OR WITHOUT PRESENCE OF ULCERS  *URINARY PROBLEMS  *BOWEL PROBLEMS  UNUSUAL RASH Items with * indicate a potential emergency and should be followed up as soon as possible.  Feel free to call the clinic you have any questions or concerns. The clinic phone number is [phone].  Please show the CHEMO ALERT CARD at check-in to the Emergency Department and triage nurse.    

## 2015-11-01 ENCOUNTER — Other Ambulatory Visit: Payer: Self-pay | Admitting: Hematology and Oncology

## 2015-11-01 LAB — KAPPA/LAMBDA LIGHT CHAINS
IG KAPPA FREE LIGHT CHAIN: 10.1 mg/L (ref 3.3–19.4)
IG LAMBDA FREE LIGHT CHAIN: 47.7 mg/L — AB (ref 5.7–26.3)
Kappa/Lambda FluidC Ratio: 0.21 — ABNORMAL LOW (ref 0.26–1.65)

## 2015-11-02 LAB — MULTIPLE MYELOMA PANEL, SERUM
ALBUMIN SERPL ELPH-MCNC: 3.6 g/dL (ref 2.9–4.4)
ALBUMIN/GLOB SERPL: 1.6 (ref 0.7–1.7)
ALPHA 1: 0.2 g/dL (ref 0.0–0.4)
ALPHA2 GLOB SERPL ELPH-MCNC: 0.7 g/dL (ref 0.4–1.0)
B-GLOBULIN SERPL ELPH-MCNC: 0.9 g/dL (ref 0.7–1.3)
Gamma Glob SerPl Elph-Mcnc: 0.5 g/dL (ref 0.4–1.8)
Globulin, Total: 2.3 g/dL (ref 2.2–3.9)
IGA/IMMUNOGLOBULIN A, SERUM: 21 mg/dL — AB (ref 90–386)
IGM (IMMUNOGLOBIN M), SRM: 27 mg/dL (ref 20–172)
IgG, Qn, Serum: 496 mg/dL — ABNORMAL LOW (ref 700–1600)
TOTAL PROTEIN: 5.9 g/dL — AB (ref 6.0–8.5)

## 2015-11-03 ENCOUNTER — Telehealth: Payer: Self-pay | Admitting: *Deleted

## 2015-11-03 ENCOUNTER — Ambulatory Visit (HOSPITAL_BASED_OUTPATIENT_CLINIC_OR_DEPARTMENT_OTHER): Payer: Medicaid Other | Admitting: Hematology and Oncology

## 2015-11-03 ENCOUNTER — Encounter: Payer: Self-pay | Admitting: Hematology and Oncology

## 2015-11-03 ENCOUNTER — Ambulatory Visit (HOSPITAL_BASED_OUTPATIENT_CLINIC_OR_DEPARTMENT_OTHER): Payer: Medicaid Other

## 2015-11-03 VITALS — BP 133/77 | HR 70 | Temp 98.4°F | Resp 18 | Wt 225.3 lb

## 2015-11-03 DIAGNOSIS — Z5112 Encounter for antineoplastic immunotherapy: Secondary | ICD-10-CM | POA: Diagnosis not present

## 2015-11-03 DIAGNOSIS — C9000 Multiple myeloma not having achieved remission: Secondary | ICD-10-CM

## 2015-11-03 DIAGNOSIS — K5909 Other constipation: Secondary | ICD-10-CM | POA: Diagnosis not present

## 2015-11-03 DIAGNOSIS — D630 Anemia in neoplastic disease: Secondary | ICD-10-CM

## 2015-11-03 DIAGNOSIS — G893 Neoplasm related pain (acute) (chronic): Secondary | ICD-10-CM

## 2015-11-03 MED ORDER — PROCHLORPERAZINE MALEATE 10 MG PO TABS
ORAL_TABLET | ORAL | Status: AC
  Filled 2015-11-03: qty 1

## 2015-11-03 MED ORDER — BORTEZOMIB CHEMO SQ INJECTION 3.5 MG (2.5MG/ML)
1.3000 mg/m2 | Freq: Once | INTRAMUSCULAR | Status: AC
Start: 2015-11-03 — End: 2015-11-03
  Administered 2015-11-03: 3 mg via SUBCUTANEOUS
  Filled 2015-11-03: qty 3

## 2015-11-03 MED ORDER — PROCHLORPERAZINE MALEATE 10 MG PO TABS
10.0000 mg | ORAL_TABLET | Freq: Once | ORAL | Status: AC
  Administered 2015-11-03: 10 mg via ORAL

## 2015-11-03 NOTE — Telephone Encounter (Signed)
Pt notified to keep appt with Dr Alvy Bimler. 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

## 2015-11-03 NOTE — Assessment & Plan Note (Signed)
This is likely anemia of chronic disease. The patient denies recent history of bleeding such as epistaxis, hematuria or hematochezia. He is asymptomatic from the anemia. We will observe for now.  He does not require transfusion now.   

## 2015-11-03 NOTE — Assessment & Plan Note (Addendum)
His recent blood work shows he has almost achieved complete remission with current treatment. He will continue acyclovir.  We will discontinue dexamethasone He is not able to get dental clearance and so he was not prescribed Zometa I will stop his treatment after this cycle and refer him to [facility] transplant team for discussion regarding autologous stem cell transplant

## 2015-11-03 NOTE — Assessment & Plan Note (Signed)
This has improved and he has not needed pain medication recently

## 2015-11-03 NOTE — Assessment & Plan Note (Signed)
He has chronic constipation and is not taking laxatives regularly. I recommend over-the-counter laxatives

## 2015-11-03 NOTE — Telephone Encounter (Signed)
Left message for patient to call us.  Needs to keep appt as scheduled with Dr Alvy Bimler.  RN called [facility], [facility] and [facility] BMT depts. Insurance is required prior to being scheduled.

## 2015-11-03 NOTE — Patient Instructions (Signed)
[facility] Discharge Instructions for Patients Receiving Chemotherapy  Today you received the following chemotherapy agents Velcade. To help prevent nausea and vomiting after your treatment, we encourage you to take your nausea medication as directed.  If you develop nausea and vomiting that is not controlled by your nausea medication, call the clinic.   BELOW ARE SYMPTOMS THAT SHOULD BE REPORTED IMMEDIATELY:  *FEVER GREATER THAN 100.5 F  *CHILLS WITH OR WITHOUT FEVER  NAUSEA AND VOMITING THAT IS NOT CONTROLLED WITH YOUR NAUSEA MEDICATION  *UNUSUAL SHORTNESS OF BREATH  *UNUSUAL BRUISING OR BLEEDING  TENDERNESS IN MOUTH AND THROAT WITH OR WITHOUT PRESENCE OF ULCERS  *URINARY PROBLEMS  *BOWEL PROBLEMS  UNUSUAL RASH Items with * indicate a potential emergency and should be followed up as soon as possible.  Feel free to call the clinic you have any questions or concerns. The clinic phone number is [phone].  Please show the CHEMO ALERT CARD at check-in to the Emergency Department and triage nurse.    

## 2015-11-03 NOTE — Progress Notes (Signed)
[facility] OFFICE PROGRESS NOTE  Patient Care Team: [name], MD as PCP - General (Internal Medicine)  SUMMARY OF ONCOLOGIC HISTORY:   Multiple myeloma not having achieved remission ([place])   06/23/2015 - 06/28/2015 Hospital Admission    The patient was admitted to the hospital due to gait ataxia and back pain. He was subsequently found to have cord compression underwent surgery and was discharged home      06/24/2015 Imaging    Abnormal appearance of the T6 vertebral body, highly suspicious for possible osseous metastasis. Associated pathologic fracture withup to 30% height loss. There is associated abnormal soft tissue density within the ventral epidural space,      06/24/2015 Imaging    MRI lumbar: Focal osseous lesion with abnormal enhancement involving the right pedicle of L3, suspicious for possible osseous metastasisgiven the findings in the thoracic spine. Question additional focal lesion within the right iliac wing as above.        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 Zometa is not given due to inability to get dental clearance       INTERVAL HISTORY: Please see below for problem oriented charting. He returns for further follow-up. He denies neuropathy. The tightness around his waist has improved. He has not taken any pain medicine. He has mild constipation. No nausea.  REVIEW OF SYSTEMS:   Constitutional: Denies fevers, chills or abnormal weight loss Eyes: Denies blurriness of vision Ears, nose, mouth, throat, and face: Denies mucositis or sore  throat Respiratory: Denies cough, dyspnea or wheezes Cardiovascular: Denies palpitation, chest discomfort or lower extremity swelling Gastrointestinal:  Denies nausea, heartburn or change in bowel habits Skin: Denies abnormal skin rashes Lymphatics: Denies new lymphadenopathy or easy bruising Neurological:Denies numbness, tingling or new weaknesses Behavioral/Psych: Mood is stable, no new changes  All other systems were reviewed with the patient and are negative.  I have reviewed the past medical history, past surgical history, social history and family history with the patient and they are unchanged from previous note.  ALLERGIES:  has No Known Allergies.  MEDICATIONS:  Current Outpatient Prescriptions  Medication Sig Dispense Refill  . acyclovir (ZOVIRAX) 400 MG tablet Take 1 tablet (400 mg total) by mouth 2 (two) times daily. 60 tablet 3  . cholecalciferol (VITAMIN D) 1000 units tablet Take 1,000 Units by mouth daily.    Marland Kitchen dexamethasone (DECADRON) 4 MG tablet Take 1 tablet (4 mg total) by mouth 2 (two) times daily. (Patient taking differently: Take 4 mg by mouth once a week. ) 60 tablet 1  . HYDROcodone-acetaminophen (NORCO/VICODIN) 5-325 MG tablet Take 1 tablet by mouth every 6 (six) hours as needed for moderate pain. 90 tablet 0  . lenalidomide (REVLIMID) 10 MG capsule Take 1 capsule (10 mg total) by mouth daily. For 14 days on and then 7 days off each cycle. 14 capsule 0  . ondansetron (ZOFRAN) 8 MG tablet Take 1 tablet (8 mg total) by mouth every 8 (eight) hours as needed (Nausea or vomiting). 30 tablet 1  . senna-docusate (SENOKOT-S) 8.6-50  MG tablet Take 1 tablet by mouth at bedtime as needed for mild constipation. 30 tablet 0   No current facility-administered medications for this visit.     PHYSICAL EXAMINATION: ECOG PERFORMANCE STATUS: 1 - Symptomatic but completely ambulatory  Vitals:   11/03/15 1034  BP: 133/77  Pulse: 70  Resp: 18  Temp: 98.4 F (36.9 C)   Filed  Weights   11/03/15 1034  Weight: 225 lb 4.8 oz (102.2 kg)    GENERAL:alert, no distress and comfortable SKIN: skin color, texture, turgor are normal, no rashes or significant lesions EYES: normal, Conjunctiva are pink and non-injected, sclera clear Musculoskeletal:no cyanosis of digits and no clubbing  NEURO: alert & oriented x 3 with fluent speech, no focal motor/sensory deficits  LABORATORY DATA:  I have reviewed the data as listed    Component Value Date/Time   NA 142 10/31/2015 0838   K 3.7 10/31/2015 0838   CL 110 07/20/2015 0755   CO2 26 10/31/2015 0838   GLUCOSE 79 10/31/2015 0838   BUN 10.3 10/31/2015 0838   CREATININE 1.0 10/31/2015 0838   CALCIUM 9.2 10/31/2015 0838   PROT 6.3 (L) 10/31/2015 0838   PROT 5.9 (L) 10/31/2015 0838   ALBUMIN 3.7 10/31/2015 0838   AST 16 10/31/2015 0838   ALT 22 10/31/2015 0838   ALKPHOS 54 10/31/2015 0838   BILITOT 0.60 10/31/2015 0838   GFRNONAA 49 (L) 07/20/2015 0755   GFRAA 56 (L) 07/20/2015 0755    No results found for: SPEP, UPEP  Lab Results  Component Value Date   WBC 4.0 10/31/2015   NEUTROABS 2.5 10/31/2015   HGB 12.7 (L) 10/31/2015   HCT 39.0 10/31/2015   MCV 89.3 10/31/2015   PLT 181 10/31/2015      Chemistry      Component Value Date/Time   NA 142 10/31/2015 0838   K 3.7 10/31/2015 0838   CL 110 07/20/2015 0755   CO2 26 10/31/2015 0838   BUN 10.3 10/31/2015 0838   CREATININE 1.0 10/31/2015 0838      Component Value Date/Time   CALCIUM 9.2 10/31/2015 0838   ALKPHOS 54 10/31/2015 0838   AST 16 10/31/2015 0838   ALT 22 10/31/2015 0838   BILITOT 0.60 10/31/2015 0838      ASSESSMENT & PLAN:  Multiple myeloma not having achieved remission ([facility] [place]) His recent blood work shows he has almost achieved complete remission with current treatment. He will continue acyclovir.  We will discontinue dexamethasone He is not able to get dental clearance and so he was not prescribed Zometa I will stop his treatment  after this cycle and refer him to [facility] transplant team for discussion regarding autologous stem cell transplant  Anemia in neoplastic disease This is likely anemia of chronic disease. The patient denies recent history of bleeding such as epistaxis, hematuria or hematochezia. He is asymptomatic from the anemia. We will observe for now.  He does not require transfusion now.    Cancer associated pain This has improved and he has not needed pain medication recently  Constipation, chronic He has chronic constipation and is not taking laxatives regularly. I recommend over-the-counter laxatives   Orders Placed This Encounter  Procedures  . Kappa/lambda light chains    Standing Status:   Future    Standing Expiration Date:   12/07/2016  . Multiple Myeloma Panel (SPEP&IFE w/QIG)    Standing Status:   Future    Standing Expiration Date:   12/07/2016   All questions  were answered. The patient knows to call the clinic with any problems, questions or concerns. No barriers to learning was detected. I spent 15 minutes counseling the patient face to face. The total time spent in the appointment was 20 minutes and more than 50% was on counseling and review of test results     [name], MD 11/03/2015 11:09 AM

## 2015-11-09 ENCOUNTER — Encounter: Payer: Self-pay | Admitting: Hematology and Oncology

## 2015-11-09 NOTE — Progress Notes (Unsigned)
Fax confirmation sheet received from [employer].

## 2015-11-09 NOTE — Progress Notes (Signed)
Faxed flowsheets, letter from physician, and reimbursement form to [employer]. Pending transmission status.

## 2015-11-10 ENCOUNTER — Encounter: Payer: Self-pay | Admitting: Hematology and Oncology

## 2015-11-10 ENCOUNTER — Telehealth: Payer: Self-pay | Admitting: *Deleted

## 2015-11-10 NOTE — Progress Notes (Signed)
Received call from [employer] reimbursement(Lawanda) stating the flowsheets that were faxed cut the name off and needed to be re-faxed. She also states because there were so many dates of service requested, she had to escalate to her manager and would require an explanation of why this was just now being requested. Advised Darlena and she informed me to re-fax the flowsheets and try faxing them upside down. I faxed to [employer]. I also advised Darlena that I forwarded the voicemail to her so that she may contact them with the explanation of the delay. Lawanda with [employer] also informed me that she would send a shipment out on Monday 11/13/15 for his treatment on Tuesday 11/14/15 and it will be received on Tuesday 11/14/15.

## 2015-11-10 NOTE — Telephone Encounter (Signed)
Refill request from [facility] for Revlimid.   Revlimid on Hold now per Dr. Alvy Bimler.  Waiting for referral to BMT for pt.[facility] to notify of Revlimid on hold right now.  Will send new Rx if resumed.    Called and waited over 10 minutes to speak w/ someone.  So I faxed Rx informing of med on hold.

## 2015-11-14 ENCOUNTER — Ambulatory Visit: Payer: Self-pay

## 2015-11-17 ENCOUNTER — Ambulatory Visit: Payer: Self-pay

## 2015-11-20 ENCOUNTER — Other Ambulatory Visit: Payer: Self-pay | Admitting: Hematology and Oncology

## 2015-11-20 DIAGNOSIS — C9000 Multiple myeloma not having achieved remission: Secondary | ICD-10-CM

## 2015-11-20 MED FILL — ACYCLOVIR 400 MG TABLET: 400 | 30 days supply | Qty: 60 | Fill #0

## 2015-11-21 ENCOUNTER — Ambulatory Visit: Payer: Self-pay

## 2015-11-24 ENCOUNTER — Ambulatory Visit: Payer: Self-pay

## 2015-12-05 ENCOUNTER — Encounter: Payer: Self-pay | Admitting: Hematology and Oncology

## 2015-12-05 ENCOUNTER — Telehealth: Payer: Self-pay | Admitting: Hematology and Oncology

## 2015-12-05 ENCOUNTER — Ambulatory Visit (HOSPITAL_BASED_OUTPATIENT_CLINIC_OR_DEPARTMENT_OTHER): Payer: Medicaid Other | Admitting: Hematology and Oncology

## 2015-12-05 ENCOUNTER — Other Ambulatory Visit (HOSPITAL_BASED_OUTPATIENT_CLINIC_OR_DEPARTMENT_OTHER): Payer: Medicaid Other

## 2015-12-05 ENCOUNTER — Other Ambulatory Visit: Payer: Self-pay | Admitting: *Deleted

## 2015-12-05 VITALS — BP 137/81 | HR 74 | Temp 98.4°F | Resp 18 | Ht 75.0 in | Wt 224.3 lb

## 2015-12-05 DIAGNOSIS — G9520 Unspecified cord compression: Secondary | ICD-10-CM

## 2015-12-05 DIAGNOSIS — C9000 Multiple myeloma not having achieved remission: Secondary | ICD-10-CM

## 2015-12-05 DIAGNOSIS — G893 Neoplasm related pain (acute) (chronic): Secondary | ICD-10-CM | POA: Diagnosis not present

## 2015-12-05 DIAGNOSIS — G620 Drug-induced polyneuropathy: Secondary | ICD-10-CM | POA: Insufficient documentation

## 2015-12-05 DIAGNOSIS — C7951 Secondary malignant neoplasm of bone: Secondary | ICD-10-CM

## 2015-12-05 DIAGNOSIS — G9529 Other cord compression: Secondary | ICD-10-CM

## 2015-12-05 DIAGNOSIS — T451X5A Adverse effect of antineoplastic and immunosuppressive drugs, initial encounter: Secondary | ICD-10-CM

## 2015-12-05 LAB — COMPREHENSIVE METABOLIC PANEL
ALBUMIN: 3.9 g/dL (ref 3.5–5.0)
ALT: 13 U/L (ref 0–55)
AST: 14 U/L (ref 5–34)
Alkaline Phosphatase: 56 U/L (ref 40–150)
Anion Gap: 9 mEq/L (ref 3–11)
BILIRUBIN TOTAL: 0.95 mg/dL (ref 0.20–1.20)
BUN: 12.8 mg/dL (ref 7.0–26.0)
CALCIUM: 9.5 mg/dL (ref 8.4–10.4)
CHLORIDE: 108 meq/L (ref 98–109)
CO2: 25 mEq/L (ref 22–29)
CREATININE: 1 mg/dL (ref 0.7–1.3)
EGFR: >90 mL/min/{1.73_m2} (ref >90)
Glucose: 98 mg/dl (ref 70–140)
Potassium: 3.6 mEq/L (ref 3.5–5.1)
Sodium: 142 mEq/L (ref 136–145)
TOTAL PROTEIN: 6.5 g/dL (ref 6.4–8.3)

## 2015-12-05 LAB — CBC WITH DIFFERENTIAL/PLATELET
BASO%: 0.6 % (ref 0.0–2.0)
Basophils Absolute: 0 10*3/uL (ref 0.0–0.1)
EOS%: 2.9 % (ref 0.0–7.0)
Eosinophils Absolute: 0.1 10*3/uL (ref 0.0–0.5)
HEMATOCRIT: 38.1 % — AB (ref 38.4–49.9)
HEMOGLOBIN: 13.2 g/dL (ref 13.0–17.1)
LYMPH#: 1.3 10*3/uL (ref 0.9–3.3)
LYMPH%: 37.3 % (ref 14.0–49.0)
MCH: 29.1 pg (ref 27.2–33.4)
MCHC: 34.6 g/dL (ref 32.0–36.0)
MCV: 84.1 fL (ref 79.3–98.0)
MONO#: 0.4 10*3/uL (ref 0.1–0.9)
MONO%: 11.4 % (ref 0.0–14.0)
NEUT%: 47.8 % (ref 39.0–75.0)
NEUTROS ABS: 1.6 10*3/uL (ref 1.5–6.5)
Platelets: 210 10*3/uL (ref 140–400)
RBC: 4.53 10*6/uL (ref 4.20–5.82)
RDW: 13.4 % (ref 11.0–14.6)
WBC: 3.4 10*3/uL — AB (ref 4.0–10.3)

## 2015-12-05 MED ORDER — GABAPENTIN 300 MG PO CAPS: 300.0000 mg | ORAL_CAPSULE | Freq: Three times a day (TID) | ORAL | 6 refills | Status: DC

## 2015-12-05 MED ORDER — LENALIDOMIDE 15 MG PO CAPS: 15.0000 mg | ORAL_CAPSULE | Freq: Every day | ORAL | 0 refills | Status: DC

## 2015-12-05 MED ORDER — HYDROCODONE-ACETAMINOPHEN 5-325 MG PO TABS: 1.0000 | ORAL_TABLET | Freq: Four times a day (QID) | ORAL | 0 refills | Status: DC | PRN

## 2015-12-05 MED FILL — HYDROCODON-APAP 5-325: 5-325 | 22 days supply | Qty: 90 | Fill #0

## 2015-12-05 MED FILL — GABAPENTIN 300 MG CAPSULE: 300 | 30 days supply | Qty: 90 | Fill #0

## 2015-12-05 NOTE — Telephone Encounter (Signed)
Gave patient avs report and appointments for November.  °

## 2015-12-05 NOTE — Progress Notes (Signed)
Received call from [place] to follow up on status of patient's Medicaid. Advised her that the last time I checked with the financial counselor on the hospital side, it was in review in [place]. Advised her that once we send the application over, it is out of our hands because they follow up with the patient directly. She called back and said the patient told the doctor that it was something that she didn't fill out correctly and would I reach out to the patient to follow up on this. I called patient and left a voicemail to return my call. In the meanwhile, I went over to the hospital side to ask Elta Guadeloupe whom also works for [employer] [facility] if he could review notes to determine if patient had been denied. After going through several notes which took quite some time, he was able to see where the patient was denied for Medicaid due to not being determined disabled. He explained that the patient would have to appeal the decision within 60 days of when the letter was sent. I also talked with [name](hospital side Development worker, community) who had been working with the patient as well. I updated her on the status based on the information Elta Guadeloupe could find. Elta Guadeloupe shared with [place] again from what he could tell had happened but the detailed information as far as what records were sent, he could not see. I agreed to continue to try and reach the patient to find out additional inforamation.  When returning to my desk, I attempted to reach the patient again and he answered. I advised him that I was checking to see how he was doing and on the status of his Medicaid. Patient states he could be better and was ready to have the stem cell transplant. He said that his Medicaid was denied last month and the same day he received the denial letter, he called his caseworker to appeal it. He also states he was denied for Disability and appealed that one also. He states he spoke with his caseworker at [place] today and he would receive a letter advising him of  details for the hearing and she would be there also on his behalf. Patient states the disability was denied because he doesn't think they understood what the doctor was saying because he was done with chemotherapy but it wasn't clear that he has to have the stem cell transplant. Patient is waiting for appeal details for the disability as well.  Asked patient if any information was needed from the [place] that he was made aware of and he said no. Advised patient to keep me updated with any new information he receives. Patient verbalized understanding and has my card for any additional financial questions or concerns.

## 2015-12-05 NOTE — Assessment & Plan Note (Signed)
He has completed palliative radiation therapy. Clinically, he is relatively asymptomatic apart from sensation of tightness. He has some altered sensation down his legs but nothing to suggest imminent cord compression. He has no appreciable weakness in both lower extremities I will repeat MRI for evaluation

## 2015-12-05 NOTE — Progress Notes (Signed)
[facility] OFFICE PROGRESS NOTE  Patient Care Team: [name], MD as PCP - General (Internal Medicine)  SUMMARY OF ONCOLOGIC HISTORY:   Multiple myeloma not having achieved remission ([place])   06/23/2015 - 06/28/2015 Hospital Admission    The patient was admitted to the hospital due to gait ataxia and back pain. He was subsequently found to have cord compression underwent surgery and was discharged home      06/24/2015 Imaging    Abnormal appearance of the T6 vertebral body, highly suspicious for possible osseous metastasis. Associated pathologic fracture withup to 30% height loss. There is associated abnormal soft tissue density within the ventral epidural space,      06/24/2015 Imaging    MRI lumbar: Focal osseous lesion with abnormal enhancement involving the right pedicle of L3, suspicious for possible osseous metastasisgiven the findings in the thoracic spine. Question additional focal lesion within the right iliac wing as above.        06/25/2015 Pathology Results    Accession: [id number] bone biopsy come from plasma cell neoplasm.      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 Zometa is not given due to inability to get dental clearance       INTERVAL HISTORY: Please see below for problem oriented charting. He returns for follow-up. Unfortunately, he is not able to get insurance coverage for his treatment He is not able to see dentist and is not able to see transplant physician He complained of peripheral neuropathy causing burning sensation in his legs He continues to have tightness but no new neurological deficit He denies recent infection He continues to  have persistent back pain, stable  REVIEW OF SYSTEMS:   Constitutional: Denies fevers, chills or abnormal weight loss Eyes: Denies blurriness of vision Ears, nose, mouth, throat, and face: Denies mucositis or sore throat Respiratory: Denies cough, dyspnea or wheezes Cardiovascular: Denies palpitation, chest discomfort or lower extremity swelling Gastrointestinal:  Denies nausea, heartburn or change in bowel habits Skin: Denies abnormal skin rashes Lymphatics: Denies new lymphadenopathy or easy bruising Behavioral/Psych: Mood is stable, no new changes  All other systems were reviewed with the patient and are negative.  I have reviewed the past medical history, past surgical history, social history and family history with the patient and they are unchanged from previous note.  ALLERGIES:  has No Known Allergies.  MEDICATIONS:  Current Outpatient Prescriptions  Medication Sig Dispense Refill  . acyclovir (ZOVIRAX) 400 MG tablet TAKE 1 TABLET BY MOUTH 2 TIMES DAILY 60 tablet 3  . cholecalciferol (VITAMIN D) 1000 units tablet Take 1,000 Units by mouth daily.    Marland Kitchen dexamethasone (DECADRON) 4 MG tablet Take 1 tablet (4 mg total) by mouth 2 (two) times daily. (Patient taking differently: Take 4 mg by mouth once a week. ) 60 tablet 1  . HYDROcodone-acetaminophen (NORCO/VICODIN) 5-325 MG tablet Take 1 tablet by mouth every 6 (six) hours as needed for moderate pain. 90 tablet 0  . ondansetron (ZOFRAN) 8 MG tablet Take 1 tablet (8 mg total) by mouth every 8 (eight) hours as needed (Nausea or vomiting). 30 tablet 1  . senna-docusate (SENOKOT-S) 8.6-50 MG tablet Take 1  tablet by mouth at bedtime as needed for mild constipation. 30 tablet 0  . gabapentin (NEURONTIN) 300 MG capsule Take 1 capsule (300 mg total) by mouth 3 (three) times daily. 90 capsule 6  . lenalidomide (REVLIMID) 15 MG capsule Take 1 capsule (15 mg total) by mouth daily. 21 capsule 0   No current facility-administered medications for  this visit.     PHYSICAL EXAMINATION: ECOG PERFORMANCE STATUS: 1 - Symptomatic but completely ambulatory  Vitals:   12/05/15 0923  BP: 137/81  Pulse: 74  Resp: 18  Temp: 98.4 F (36.9 C)   Filed Weights   12/05/15 0923  Weight: 224 lb 4.8 oz (101.7 kg)    GENERAL:alert, no distress and comfortable SKIN: skin color, texture, turgor are normal, no rashes or significant lesions EYES: normal, Conjunctiva are pink and non-injected, sclera clear OROPHARYNX:no exudate, no erythema and lips, buccal mucosa, and tongue normal  NECK: supple, thyroid normal size, non-tender, without nodularity LYMPH:  no palpable lymphadenopathy in the cervical, axillary or inguinal LUNGS: clear to auscultation and percussion with normal breathing effort HEART: regular rate & rhythm and no murmurs and no lower extremity edema ABDOMEN:abdomen soft, non-tender and normal bowel sounds Musculoskeletal:no cyanosis of digits and no clubbing  NEURO: alert & oriented x 3 with fluent speech, no focal motor/sensory deficits  LABORATORY DATA:  I have reviewed the data as listed    Component Value Date/Time   NA 142 12/05/2015 0903   K 3.6 12/05/2015 0903   CL 110 07/20/2015 0755   CO2 25 12/05/2015 0903   GLUCOSE 98 12/05/2015 0903   BUN 12.8 12/05/2015 0903   CREATININE 1.0 12/05/2015 0903   CALCIUM 9.5 12/05/2015 0903   PROT 6.5 12/05/2015 0903   ALBUMIN 3.9 12/05/2015 0903   AST 14 12/05/2015 0903   ALT 13 12/05/2015 0903   ALKPHOS 56 12/05/2015 0903   BILITOT 0.95 12/05/2015 0903   GFRNONAA 49 (L) 07/20/2015 0755   GFRAA 56 (L) 07/20/2015 0755    No results found for: SPEP, UPEP  Lab Results  Component Value Date   WBC 3.4 (L) 12/05/2015   NEUTROABS 1.6 12/05/2015   HGB 13.2 12/05/2015   HCT 38.1 (L) 12/05/2015   MCV 84.1 12/05/2015   PLT 210 12/05/2015      Chemistry      Component Value Date/Time   NA 142 12/05/2015 0903   K 3.6 12/05/2015 0903   CL 110 07/20/2015 0755   CO2 25  12/05/2015 0903   BUN 12.8 12/05/2015 0903   CREATININE 1.0 12/05/2015 0903      Component Value Date/Time   CALCIUM 9.5 12/05/2015 0903   ALKPHOS 56 12/05/2015 0903   AST 14 12/05/2015 0903   ALT 13 12/05/2015 0903   BILITOT 0.95 12/05/2015 0903      ASSESSMENT & PLAN:  Multiple myeloma not having achieved remission ([place]) Unfortunately, due to lack of insurance, I cannot refer him to transplant centers I will enlist the help of our financial navigator to help him apply for some form of insurance I will switch him to Revlimid maintenance treatment at 15 mg, days 1-21, rest 7 days. He will continue calcium, vitamin D, acyclovir and aspirin therapy Due to lack of insurance, he was not able to get dental clearance. 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 Clinically, he is relatively asymptomatic apart from sensation of tightness. He has some altered sensation down his legs but nothing to suggest imminent cord compression. 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 MR Lumbar Spine W Contrast    Standing Status:   Future    Standing Expiration Date:   12/04/2016    Order Specific Question:   If indicated for the ordered procedure, I authorize the administration of contrast media per Radiology protocol    Answer:   Yes    Order Specific Question:   Reason for Exam (SYMPTOM  OR  DIAGNOSIS REQUIRED)    Answer:   myeloma, compression fractures, back pain    Order Specific Question:   Preferred imaging location?    Answer:   [facility] (table limit-350 lbs)    Order Specific Question:   What is the patient's sedation requirement?    Answer:   No Sedation    Order Specific Question:   Does the patient have a pacemaker or implanted devices?    Answer:   No  . MR Thoracic Spine W Contrast    Standing Status:   Future    Standing Expiration Date:   12/04/2016    Order Specific Question:   If indicated for the ordered procedure, I authorize the administration of contrast media per Radiology protocol    Answer:   Yes    Order Specific Question:   Reason for Exam (SYMPTOM  OR DIAGNOSIS REQUIRED)    Answer:   myeloma, compression fractures, back pain    Order Specific Question:   Preferred imaging location?    Answer:   [facility] (table limit-350 lbs)    Order Specific Question:   What is the patient's sedation requirement?    Answer:   No Sedation    Order Specific Question:   Does the patient have a pacemaker or implanted devices?    Answer:   No   All questions were answered. The patient knows to call the clinic with any problems, questions or concerns. No barriers to learning was detected. I spent 25 minutes counseling the patient face to face. The total time spent in the appointment was 40 minutes and more than 50% was on counseling and review of test results     [name], MD 12/05/2015 12:01 PM

## 2015-12-05 NOTE — Assessment & Plan Note (Signed)
This is stable I refilled his prescriptions for pain medication today

## 2015-12-05 NOTE — Assessment & Plan Note (Signed)
He has severe peripheral neuropathy from Velcade I would discontinue Velcade I will start him on gabapentin for neuropathic pain and reassess in 2 weeks

## 2015-12-05 NOTE — Assessment & Plan Note (Signed)
Unfortunately, due to lack of insurance, I cannot refer him to transplant centers I will enlist the help of our financial navigator to help him apply for some form of insurance I will switch him to Revlimid maintenance treatment at 15 mg, days 1-21, rest 7 days. He will continue calcium, vitamin D, acyclovir and aspirin therapy Due to lack of insurance, he was not able to get dental clearance. I will hold off giving him Zometa With the persistent back pain, I will order MRI to evaluate further I will see him back in 2 weeks

## 2015-12-06 LAB — KAPPA/LAMBDA LIGHT CHAINS
Ig Kappa Free Light Chain: 7.2 mg/L (ref 3.3–19.4)
Ig Lambda Free Light Chain: 125.6 mg/L — ABNORMAL HIGH (ref 5.7–26.3)
Kappa/Lambda FluidC Ratio: 0.06 — ABNORMAL LOW (ref 0.26–1.65)

## 2015-12-07 ENCOUNTER — Other Ambulatory Visit: Payer: Self-pay | Admitting: Hematology and Oncology

## 2015-12-07 ENCOUNTER — Encounter: Payer: Self-pay | Admitting: Hematology and Oncology

## 2015-12-07 DIAGNOSIS — C9000 Multiple myeloma not having achieved remission: Secondary | ICD-10-CM

## 2015-12-07 DIAGNOSIS — C7951 Secondary malignant neoplasm of bone: Secondary | ICD-10-CM

## 2015-12-07 NOTE — Progress Notes (Signed)
Received letter from [place] [employer] inquiring if patient still needs assistance for Velcade. Obtained answer and signature from physician. Faxed back to [facility] Reimbursement. Fax received ok per confirmation sheet.

## 2015-12-08 LAB — MULTIPLE MYELOMA PANEL, SERUM
ALBUMIN/GLOB SERPL: 2.2 — AB (ref 0.7–1.7)
ALPHA2 GLOB SERPL ELPH-MCNC: 0.6 g/dL (ref 0.4–1.0)
Albumin SerPl Elph-Mcnc: 4.2 g/dL (ref 2.9–4.4)
Alpha 1: 0.1 g/dL (ref 0.0–0.4)
B-GLOBULIN SERPL ELPH-MCNC: 0.9 g/dL (ref 0.7–1.3)
GAMMA GLOB SERPL ELPH-MCNC: 0.5 g/dL (ref 0.4–1.8)
GLOBULIN, TOTAL: 2 g/dL — AB (ref 2.2–3.9)
IGG (IMMUNOGLOBIN G), SERUM: 595 mg/dL — AB (ref 700–1600)
IgA, Qn, Serum: 27 mg/dL — ABNORMAL LOW (ref 90–386)
IgM, Qn, Serum: 25 mg/dL (ref 20–172)
TOTAL PROTEIN: 6.2 g/dL (ref 6.0–8.5)

## 2015-12-18 ENCOUNTER — Ambulatory Visit (HOSPITAL_COMMUNITY)
Admission: RE | Admit: 2015-12-18 | Discharge: 2015-12-18 | Disposition: A | Discharge status: Home / Self Care | Payer: Medicaid Other | Source: Ambulatory Visit | Attending: Hematology and Oncology | Admitting: Hematology and Oncology

## 2015-12-18 DIAGNOSIS — C7951 Secondary malignant neoplasm of bone: Secondary | ICD-10-CM

## 2015-12-18 DIAGNOSIS — C9000 Multiple myeloma not having achieved remission: Secondary | ICD-10-CM

## 2015-12-18 MED ORDER — GADOBENATE DIMEGLUMINE 529 MG/ML IV SOLN
20.0000 mL | Freq: Once | INTRAVENOUS | Status: AC | PRN
  Administered 2015-12-18: 20 mL via INTRAVENOUS

## 2015-12-19 ENCOUNTER — Other Ambulatory Visit (HOSPITAL_BASED_OUTPATIENT_CLINIC_OR_DEPARTMENT_OTHER): Payer: Medicaid Other

## 2015-12-19 ENCOUNTER — Telehealth: Payer: Self-pay | Admitting: Hematology and Oncology

## 2015-12-19 ENCOUNTER — Encounter: Payer: Self-pay | Admitting: Hematology and Oncology

## 2015-12-19 ENCOUNTER — Ambulatory Visit (HOSPITAL_BASED_OUTPATIENT_CLINIC_OR_DEPARTMENT_OTHER): Payer: Medicaid Other | Admitting: Hematology and Oncology

## 2015-12-19 VITALS — BP 134/78 | HR 71 | Temp 98.1°F | Resp 18 | Ht 75.0 in | Wt 224.2 lb

## 2015-12-19 DIAGNOSIS — T451X5A Adverse effect of antineoplastic and immunosuppressive drugs, initial encounter: Secondary | ICD-10-CM

## 2015-12-19 DIAGNOSIS — C9000 Multiple myeloma not having achieved remission: Secondary | ICD-10-CM

## 2015-12-19 DIAGNOSIS — G620 Drug-induced polyneuropathy: Secondary | ICD-10-CM | POA: Diagnosis not present

## 2015-12-19 DIAGNOSIS — D61810 Antineoplastic chemotherapy induced pancytopenia: Secondary | ICD-10-CM

## 2015-12-19 DIAGNOSIS — G893 Neoplasm related pain (acute) (chronic): Secondary | ICD-10-CM | POA: Diagnosis not present

## 2015-12-19 DIAGNOSIS — K5909 Other constipation: Secondary | ICD-10-CM

## 2015-12-19 LAB — CBC WITH DIFFERENTIAL/PLATELET
BASO%: 0.5 % (ref 0.0–2.0)
BASOS ABS: 0 10*3/uL (ref 0.0–0.1)
EOS ABS: 0.2 10*3/uL (ref 0.0–0.5)
EOS%: 5.2 % (ref 0.0–7.0)
HCT: 40 % (ref 38.4–49.9)
HGB: 12.9 g/dL — ABNORMAL LOW (ref 13.0–17.1)
LYMPH%: 21.8 % (ref 14.0–49.0)
MCH: 28.2 pg (ref 27.2–33.4)
MCHC: 32.2 g/dL (ref 32.0–36.0)
MCV: 87.7 fL (ref 79.3–98.0)
MONO#: 0.4 10*3/uL (ref 0.1–0.9)
MONO%: 11.7 % (ref 0.0–14.0)
NEUT#: 2.3 10*3/uL (ref 1.5–6.5)
NEUT%: 60.8 % (ref 39.0–75.0)
Platelets: 198 10*3/uL (ref 140–400)
RBC: 4.56 10*6/uL (ref 4.20–5.82)
RDW: 14.3 % (ref 11.0–14.6)
WBC: 3.8 10*3/uL — AB (ref 4.0–10.3)
lymph#: 0.8 10*3/uL — ABNORMAL LOW (ref 0.9–3.3)

## 2015-12-19 LAB — COMPREHENSIVE METABOLIC PANEL
ALK PHOS: 50 U/L (ref 40–150)
ALT: 12 U/L (ref 0–55)
AST: 13 U/L (ref 5–34)
Albumin: 4 g/dL (ref 3.5–5.0)
Anion Gap: 7 mEq/L (ref 3–11)
BUN: 11.4 mg/dL (ref 7.0–26.0)
CHLORIDE: 105 meq/L (ref 98–109)
CO2: 29 meq/L (ref 22–29)
Calcium: 9.5 mg/dL (ref 8.4–10.4)
Creatinine: 0.9 mg/dL (ref 0.7–1.3)
GLUCOSE: 90 mg/dL (ref 70–140)
POTASSIUM: 3.7 meq/L (ref 3.5–5.1)
SODIUM: 141 meq/L (ref 136–145)
Total Bilirubin: 0.83 mg/dL (ref 0.20–1.20)
Total Protein: 6.6 g/dL (ref 6.4–8.3)

## 2015-12-19 MED ORDER — GABAPENTIN 300 MG PO CAPS: 600.0000 mg | ORAL_CAPSULE | Freq: Three times a day (TID) | ORAL | 6 refills | Status: DC

## 2015-12-19 MED ORDER — HYDROCODONE-ACETAMINOPHEN 5-325 MG PO TABS: 1.0000 | ORAL_TABLET | Freq: Three times a day (TID) | ORAL | 0 refills | Status: DC | PRN

## 2015-12-19 MED FILL — GABAPENTIN 300 MG CAPSULE: 300 | 15 days supply | Qty: 90 | Fill #0

## 2015-12-19 NOTE — Assessment & Plan Note (Signed)
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

## 2015-12-19 NOTE — Assessment & Plan Note (Signed)
Unfortunately, due to lack of insurance, I cannot refer him to transplant centers I will enlist the help of our financial navigator to help him apply for some form of insurance For now, he will continue Revlimid maintenance treatment at 15 mg, days 1-21, rest 7 days. He will continue calcium, vitamin D, acyclovir and aspirin therapy Due to lack of insurance, he was not able to get dental clearance. I will hold off giving him Zometa His back pain is stable I will get his MRI scan review at the next [facility] oncology tumor board I will see him back next month for toxicity review

## 2015-12-19 NOTE — Telephone Encounter (Signed)
Appointments scheduled per 12/19/15 los. A copy of the AVS report and appointment schedule, was given to patient, per 12/19/15 los. °

## 2015-12-19 NOTE — Assessment & Plan Note (Signed)
This is likely due to recent treatment. The patient denies recent history of fevers, cough, chills, diarrhea or dysuria. He is asymptomatic from the leukopenia and anemia. I will observe for now.  I will continue the chemotherapy at current dose without dosage adjustment.  If the leukopenia gets progressive worse in the future, I might have to delay his treatment or adjust the chemotherapy dose.

## 2015-12-19 NOTE — Progress Notes (Signed)
[place] OFFICE PROGRESS NOTE  Patient Care Team: [name], MD as PCP - General (Internal Medicine)  SUMMARY OF ONCOLOGIC HISTORY:   Multiple myeloma not having achieved remission ([place])   06/23/2015 - 06/28/2015 Hospital Admission    The patient was admitted to the hospital due to gait ataxia and back pain. He was subsequently found to have cord compression underwent surgery and was discharged home      06/24/2015 Imaging    Abnormal appearance of the T6 vertebral body, highly suspicious for possible osseous metastasis. Associated pathologic fracture withup to 30% height loss. There is associated abnormal soft tissue density within the ventral epidural space,      06/24/2015 Imaging    MRI lumbar: Focal osseous lesion with abnormal enhancement involving the right pedicle of L3, suspicious for possible osseous metastasisgiven the findings in the thoracic spine. Question additional focal lesion within the right iliac wing as above.        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 Zometa is not given due to inability to get dental clearance      12/14/2015 -  Chemotherapy    He is started on maintenance treatment with Revlimid only      12/18/2015 Imaging    MRI thoracic and lumbar spine showed numerous enhancing foci throughout the thoracic and lumbar spine with several new small foci in the lumbar spine in comparison with prior MRI compatible with metastatic disease. Stable loss of height of the T3, T4, and T6 vertebral bodies and new postsurgical changes related  to T6 laminectomy. No significant epidural disease or evidence for cord compression. No abnormal enhancement of the spinal cord or cauda equina.       INTERVAL HISTORY: Please see below for problem oriented charting. He returns for follow-up. He continues to have back pain and neuropathic pain. He has some mild constipation. Denies recent fever or chills. No recent new neurological deficit or weakness. He is still not able to get Medicaid application and the funds to help him pay for Revlimid is running out  REVIEW OF SYSTEMS:   Constitutional: Denies fevers, chills or abnormal weight loss Eyes: Denies blurriness of vision Ears, nose, mouth, throat, and face: Denies mucositis or sore throat Respiratory: Denies cough, dyspnea or wheezes Cardiovascular: Denies palpitation, chest discomfort or lower extremity swelling Gastrointestinal:  Denies nausea, heartburn or change in bowel habits Skin: Denies abnormal skin rashes Lymphatics: Denies new lymphadenopathy or easy bruising Neurological:Denies numbness, tingling or new weaknesses Behavioral/Psych: Mood is stable, no new changes  All other systems were reviewed with the patient and are negative.  I have reviewed the past medical history, past surgical history, social history and family history with the patient and they are unchanged from previous note.  ALLERGIES:  has No Known Allergies.  MEDICATIONS:  Current Outpatient Prescriptions  Medication Sig Dispense Refill  . acyclovir (ZOVIRAX) 400 MG tablet TAKE 1 TABLET BY MOUTH 2 TIMES DAILY 60 tablet 3  . cholecalciferol (VITAMIN D) 1000 units tablet Take 1,000 Units by mouth daily.    Marland Kitchen  dexamethasone (DECADRON) 4 MG tablet Take 1 tablet (4 mg total) by mouth 2 (two) times daily. (Patient taking differently: Take 4 mg by mouth once a week. ) 60 tablet 1  . gabapentin (NEURONTIN) 300 MG capsule Take 2 capsules (600 mg total) by mouth 3 (three) times daily. 90 capsule 6  .  HYDROcodone-acetaminophen (NORCO/VICODIN) 5-325 MG tablet Take 1 tablet by mouth 3 (three) times daily as needed for moderate pain. 90 tablet 0  . lenalidomide (REVLIMID) 15 MG capsule Take 1 capsule (15 mg total) by mouth daily. Take for 21 days, then 7 days off 21 capsule 0  . senna-docusate (SENOKOT-S) 8.6-50 MG tablet Take 1 tablet by mouth at bedtime as needed for mild constipation. 30 tablet 0  . ondansetron (ZOFRAN) 8 MG tablet Take 1 tablet (8 mg total) by mouth every 8 (eight) hours as needed (Nausea or vomiting). (Patient not taking: Reported on 12/19/2015) 30 tablet 1   No current facility-administered medications for this visit.     PHYSICAL EXAMINATION: ECOG PERFORMANCE STATUS: 1 - Symptomatic but completely ambulatory  Vitals:   12/19/15 1006  BP: 134/78  Pulse: 71  Resp: 18  Temp: 98.1 F (36.7 C)   Filed Weights   12/19/15 1006  Weight: 224 lb 3.2 oz (101.7 kg)    GENERAL:alert, no distress and comfortable SKIN: skin color, texture, turgor are normal, no rashes or significant lesions EYES: normal, Conjunctiva are pink and non-injected, sclera clear OROPHARYNX:no exudate, no erythema and lips, buccal mucosa, and tongue normal  NECK: supple, thyroid normal size, non-tender, without nodularity LYMPH:  no palpable lymphadenopathy in the cervical, axillary or inguinal LUNGS: clear to auscultation and percussion with normal breathing effort HEART: regular rate & rhythm and no murmurs and no lower extremity edema ABDOMEN:abdomen soft, non-tender and normal bowel sounds Musculoskeletal:no cyanosis of digits and no clubbing  NEURO: alert & oriented x 3 with fluent speech, no focal motor/sensory deficits  LABORATORY DATA:  I have reviewed the data as listed    Component Value Date/Time   NA 141 12/19/2015 1114   K 3.7 12/19/2015 1114   CL 110 07/20/2015 0755   CO2 29 12/19/2015 1114   GLUCOSE 90 12/19/2015 1114   BUN 11.4 12/19/2015 1114   CREATININE 0.9 12/19/2015  1114   CALCIUM 9.5 12/19/2015 1114   PROT 6.6 12/19/2015 1114   ALBUMIN 4.0 12/19/2015 1114   AST 13 12/19/2015 1114   ALT 12 12/19/2015 1114   ALKPHOS 50 12/19/2015 1114   BILITOT 0.83 12/19/2015 1114   GFRNONAA 49 (L) 07/20/2015 0755   GFRAA 56 (L) 07/20/2015 0755    No results found for: SPEP, UPEP  Lab Results  Component Value Date   WBC 3.8 (L) 12/19/2015   NEUTROABS 2.3 12/19/2015   HGB 12.9 (L) 12/19/2015   HCT 40.0 12/19/2015   MCV 87.7 12/19/2015   PLT 198 12/19/2015      Chemistry      Component Value Date/Time   NA 141 12/19/2015 1114   K 3.7 12/19/2015 1114   CL 110 07/20/2015 0755   CO2 29 12/19/2015 1114   BUN 11.4 12/19/2015 1114   CREATININE 0.9 12/19/2015 1114      Component Value Date/Time   CALCIUM 9.5 12/19/2015 1114   ALKPHOS 50 12/19/2015 1114   AST 13 12/19/2015 1114   ALT 12 12/19/2015 1114   BILITOT 0.83 12/19/2015 1114       RADIOGRAPHIC STUDIES: I have personally reviewed the radiological images  as listed and agreed with the findings in the report. Mr Thoracic Spine W Wo Contrast  Result Date: 12/18/2015 CLINICAL DATA:  45 y/o M; myeloma with back pain and history of compression fractures for follow-up. EXAM: MRI THORACIC AND LUMBAR SPINE WITHOUT AND WITH CONTRAST TECHNIQUE: Multiplanar and multiecho pulse sequences of the thoracic and lumbar spine were obtained without and with intravenous contrast. CONTRAST:  101m MULTIHANCE GADOBENATE DIMEGLUMINE 529 MG/ML IV SOLN COMPARISON:  06/24/2015 CT of chest, abdomen and pelvis. 06/23/2015 lumbar spine MRI. FINDINGS: MRI THORACIC SPINE FINDINGS Alignment: Normal thoracic kyphosis with slight exaggeration at T6 due to vertebral body compression deformity. No listhesis. Vertebrae: There are diffuse enhancing osseous metastasis throughout the thoracic spine and visualized ribs with the largest lesions at the T4, T6, and T10 levels. Status post T6 laminectomy. There is T3 superior endplate deformity,  mild loss of height of the T4 vertebral body, moderate anterior compression deformity at T6, unchanged in comparison with prior CT. T6 metastasis slightly widened but right pedicle with effacement of right anterior thecal sac. Otherwise no significant epidural disease. No cord compression. Pedicle expansion of the T6 vertebral body also partially effaces the T6-7 neural foramina bilaterally. The left T9 superior facet metastasis partially effaces the left T8-9 neural foramen. Cord:  No abnormal cord signal or enhancement. Paraspinal and other soft tissues: Postsurgical changes in the paraspinal muscles and overlying soft tissues of the T6 level without a discrete fluid collection. Disc levels: No high-grade canal stenosis or foraminal narrowing. MRI LUMBAR SPINE FINDINGS Segmentation:  Standard. Alignment:  Physiologic. Vertebrae: Right L3 pedicle enhancing lesion likely metastasis is stable. In comparison with the prior study there are several new subcentimeter enhancing foci throughout the lumbar spine and visualized sacrum likely representing metastatic disease. There is no loss of vertebral body height or evidence for fracture. Conus medullaris: Extends to the L1-2 level and appears normal. No abnormal enhancement of colon is or cauda equina. Paraspinal and other soft tissues: Negative. Disc levels: L1-2: No significant disc displacement, foraminal narrowing, or canal stenosis. L2-3: No significant disc displacement, foraminal narrowing, or canal stenosis. L3-4: Small disc bulge without significant foraminal narrowing or canal stenosis. L4-5: Moderate disc bulge and right subarticular disc protrusion. Mild right-greater-than-left foraminal narrowing. Effacement of lateral recesses with contact upon bilateral descending L5 nerve roots greater on the right. Mild-to-moderate canal stenosis. L5-S1: Disc bulge eccentric to the right, small central protrusion, and central annular fissure. Moderate right and mild left  foraminal narrowing. No significant canal stenosis. IMPRESSION: 1. Numerous enhancing foci throughout the thoracic and lumbar spine with several new small foci in the lumbar spine in comparison with prior MRI compatible with metastatic disease. 2. Stable loss of height of the T3, T4, and T6 vertebral bodies and new postsurgical changes related to T6 laminectomy. 3. No significant epidural disease or evidence for cord compression. 4. No abnormal enhancement of the spinal cord or cauda equina. Electronically Signed   By: LKristine GarbeM.D.   On: 12/18/2015 14:35   Mr Lumbar Spine W Wo Contrast  Result Date: 12/18/2015 CLINICAL DATA:  45y/o M; myeloma with back pain and history of compression fractures for follow-up. EXAM: MRI THORACIC AND LUMBAR SPINE WITHOUT AND WITH CONTRAST TECHNIQUE: Multiplanar and multiecho pulse sequences of the thoracic and lumbar spine were obtained without and with intravenous contrast. CONTRAST:  255mMULTIHANCE GADOBENATE DIMEGLUMINE 529 MG/ML IV SOLN COMPARISON:  06/24/2015 CT of chest, abdomen and pelvis. 06/23/2015 lumbar spine MRI. FINDINGS: MRI THORACIC  SPINE FINDINGS Alignment: Normal thoracic kyphosis with slight exaggeration at T6 due to vertebral body compression deformity. No listhesis. Vertebrae: There are diffuse enhancing osseous metastasis throughout the thoracic spine and visualized ribs with the largest lesions at the T4, T6, and T10 levels. Status post T6 laminectomy. There is T3 superior endplate deformity, mild loss of height of the T4 vertebral body, moderate anterior compression deformity at T6, unchanged in comparison with prior CT. T6 metastasis slightly widened but right pedicle with effacement of right anterior thecal sac. Otherwise no significant epidural disease. No cord compression. Pedicle expansion of the T6 vertebral body also partially effaces the T6-7 neural foramina bilaterally. The left T9 superior facet metastasis partially effaces the  left T8-9 neural foramen. Cord:  No abnormal cord signal or enhancement. Paraspinal and other soft tissues: Postsurgical changes in the paraspinal muscles and overlying soft tissues of the T6 level without a discrete fluid collection. Disc levels: No high-grade canal stenosis or foraminal narrowing. MRI LUMBAR SPINE FINDINGS Segmentation:  Standard. Alignment:  Physiologic. Vertebrae: Right L3 pedicle enhancing lesion likely metastasis is stable. In comparison with the prior study there are several new subcentimeter enhancing foci throughout the lumbar spine and visualized sacrum likely representing metastatic disease. There is no loss of vertebral body height or evidence for fracture. Conus medullaris: Extends to the L1-2 level and appears normal. No abnormal enhancement of colon is or cauda equina. Paraspinal and other soft tissues: Negative. Disc levels: L1-2: No significant disc displacement, foraminal narrowing, or canal stenosis. L2-3: No significant disc displacement, foraminal narrowing, or canal stenosis. L3-4: Small disc bulge without significant foraminal narrowing or canal stenosis. L4-5: Moderate disc bulge and right subarticular disc protrusion. Mild right-greater-than-left foraminal narrowing. Effacement of lateral recesses with contact upon bilateral descending L5 nerve roots greater on the right. Mild-to-moderate canal stenosis. L5-S1: Disc bulge eccentric to the right, small central protrusion, and central annular fissure. Moderate right and mild left foraminal narrowing. No significant canal stenosis. IMPRESSION: 1. Numerous enhancing foci throughout the thoracic and lumbar spine with several new small foci in the lumbar spine in comparison with prior MRI compatible with metastatic disease. 2. Stable loss of height of the T3, T4, and T6 vertebral bodies and new postsurgical changes related to T6 laminectomy. 3. No significant epidural disease or evidence for cord compression. 4. No abnormal  enhancement of the spinal cord or cauda equina. Electronically Signed   By: Kristine Garbe M.D.   On: 12/18/2015 14:35     ASSESSMENT & PLAN:  Multiple myeloma not having achieved remission ([place]) Unfortunately, due to lack of insurance, I cannot refer him to transplant centers I will enlist the help of our financial navigator to help him apply for some form of insurance For now, he will continue Revlimid maintenance treatment at 15 mg, days 1-21, rest 7 days. He will continue calcium, vitamin D, acyclovir and aspirin therapy Due to lack of insurance, he was not able to get dental clearance. I will hold off giving him Zometa His back pain is stable I will get his MRI scan review at the next CNS oncology tumor board I will see him back next month for toxicity review  Pancytopenia due to antineoplastic chemotherapy [facility]) This is likely due to recent treatment. The patient denies recent history of fevers, cough, chills, diarrhea or dysuria. He is asymptomatic from the leukopenia and anemia. I will observe for now.  I will continue the chemotherapy at current dose without dosage adjustment.  If the leukopenia  gets progressive worse in the future, I might have to delay his treatment or adjust the chemotherapy dose.    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 I recommend over-the-counter laxatives   Orders Placed This Encounter  Procedures  . Kappa/lambda light chains    Standing Status:   Future    Standing Expiration Date:   01/22/2017  . Multiple Myeloma Panel (SPEP&IFE w/QIG)     Standing Status:   Future    Standing Expiration Date:   01/22/2017   All questions were answered. The patient knows to call the clinic with any problems, questions or concerns. No barriers to learning was detected. I spent 25 minutes counseling the patient face to face. The total time spent in the appointment was 30 minutes and more than 50% was on counseling and review of test results     [name], MD 12/19/2015 12:52 PM

## 2015-12-19 NOTE — Assessment & Plan Note (Signed)
This is stable I refilled his prescriptions for pain medication today We discussed narcotic refill policy

## 2015-12-19 NOTE — Assessment & Plan Note (Signed)
He has chronic constipation and is not taking laxatives regularly. I recommend over-the-counter laxatives

## 2015-12-25 MED FILL — ACYCLOVIR 400 MG TABLET: 400 | 30 days supply | Qty: 60 | Fill #1

## 2016-01-03 ENCOUNTER — Other Ambulatory Visit: Payer: Self-pay | Admitting: *Deleted

## 2016-01-03 MED ORDER — LENALIDOMIDE 15 MG PO CAPS: 15.0000 mg | ORAL_CAPSULE | Freq: Every day | ORAL | 0 refills | Status: DC

## 2016-01-11 ENCOUNTER — Encounter: Payer: Self-pay | Admitting: Hematology and Oncology

## 2016-01-11 ENCOUNTER — Ambulatory Visit (HOSPITAL_BASED_OUTPATIENT_CLINIC_OR_DEPARTMENT_OTHER): Payer: Medicaid Other | Admitting: Hematology and Oncology

## 2016-01-11 ENCOUNTER — Telehealth: Payer: Self-pay | Admitting: Hematology and Oncology

## 2016-01-11 ENCOUNTER — Other Ambulatory Visit (HOSPITAL_BASED_OUTPATIENT_CLINIC_OR_DEPARTMENT_OTHER): Payer: Medicaid Other

## 2016-01-11 VITALS — BP 143/94 | HR 66 | Temp 98.2°F | Resp 18 | Ht 75.0 in | Wt 223.7 lb

## 2016-01-11 DIAGNOSIS — G9529 Other cord compression: Secondary | ICD-10-CM

## 2016-01-11 DIAGNOSIS — G620 Drug-induced polyneuropathy: Secondary | ICD-10-CM | POA: Diagnosis not present

## 2016-01-11 DIAGNOSIS — G893 Neoplasm related pain (acute) (chronic): Secondary | ICD-10-CM | POA: Diagnosis not present

## 2016-01-11 DIAGNOSIS — C9000 Multiple myeloma not having achieved remission: Secondary | ICD-10-CM

## 2016-01-11 DIAGNOSIS — K5909 Other constipation: Secondary | ICD-10-CM | POA: Diagnosis not present

## 2016-01-11 DIAGNOSIS — T451X5A Adverse effect of antineoplastic and immunosuppressive drugs, initial encounter: Secondary | ICD-10-CM

## 2016-01-11 DIAGNOSIS — C7951 Secondary malignant neoplasm of bone: Secondary | ICD-10-CM

## 2016-01-11 DIAGNOSIS — G9520 Unspecified cord compression: Secondary | ICD-10-CM | POA: Diagnosis not present

## 2016-01-11 LAB — CBC WITH DIFFERENTIAL/PLATELET
BASO%: 1.6 % (ref 0.0–2.0)
BASOS ABS: 0.1 10*3/uL (ref 0.0–0.1)
EOS%: 2.8 % (ref 0.0–7.0)
Eosinophils Absolute: 0.1 10*3/uL (ref 0.0–0.5)
HCT: 36.9 % — ABNORMAL LOW (ref 38.4–49.9)
HEMOGLOBIN: 12.8 g/dL — AB (ref 13.0–17.1)
LYMPH#: 1.8 10*3/uL (ref 0.9–3.3)
LYMPH%: 35.9 % (ref 14.0–49.0)
MCH: 29.4 pg (ref 27.2–33.4)
MCHC: 34.7 g/dL (ref 32.0–36.0)
MCV: 84.6 fL (ref 79.3–98.0)
MONO#: 0.6 10*3/uL (ref 0.1–0.9)
MONO%: 11.4 % (ref 0.0–14.0)
NEUT#: 2.4 10*3/uL (ref 1.5–6.5)
NEUT%: 48.3 % (ref 39.0–75.0)
Platelets: 231 10*3/uL (ref 140–400)
RBC: 4.36 10*6/uL (ref 4.20–5.82)
RDW: 13.5 % (ref 11.0–14.6)
WBC: 5 10*3/uL (ref 4.0–10.3)

## 2016-01-11 LAB — COMPREHENSIVE METABOLIC PANEL
ALT: 18 U/L (ref 0–55)
AST: 15 U/L (ref 5–34)
Albumin: 3.8 g/dL (ref 3.5–5.0)
Alkaline Phosphatase: 54 U/L (ref 40–150)
Anion Gap: 8 mEq/L (ref 3–11)
BUN: 12.3 mg/dL (ref 7.0–26.0)
CHLORIDE: 108 meq/L (ref 98–109)
CO2: 26 mEq/L (ref 22–29)
CREATININE: 1 mg/dL (ref 0.7–1.3)
Calcium: 9.4 mg/dL (ref 8.4–10.4)
EGFR: 86 mL/min/{1.73_m2} — ABNORMAL LOW (ref >90)
GLUCOSE: 88 mg/dL (ref 70–140)
POTASSIUM: 3.4 meq/L — AB (ref 3.5–5.1)
SODIUM: 142 meq/L (ref 136–145)
Total Bilirubin: 0.87 mg/dL (ref 0.20–1.20)
Total Protein: 6.7 g/dL (ref 6.4–8.3)

## 2016-01-11 MED ORDER — HYDROCODONE-ACETAMINOPHEN 5-325 MG PO TABS: 1.0000 | ORAL_TABLET | Freq: Three times a day (TID) | ORAL | 0 refills | Status: DC | PRN

## 2016-01-11 NOTE — Assessment & Plan Note (Signed)
He has completed palliative radiation therapy. Clinically, he is relatively asymptomatic apart from sensation of tightness. He has some altered sensation down his legs but nothing to suggest imminent cord compression. He has no appreciable weakness in both lower extremities His recent MRI is reviewed with the patient. His case was discussed at the most recent CNS oncology tumor board. Continue close observation and monitoring.

## 2016-01-11 NOTE — Assessment & Plan Note (Signed)
Unfortunately, due to lack of insurance, I cannot refer him to transplant centers I will enlist the help of our financial navigator to help him apply for some form of insurance For now, he will continue Revlimid maintenance treatment at 15 mg, days 1-21, rest 7 days. He will continue calcium, vitamin D, acyclovir and aspirin therapy Due to lack of insurance, he was not able to get dental clearance. I will hold off giving him Zometa His back pain is stable MRI is stable

## 2016-01-11 NOTE — Progress Notes (Signed)
[place] OFFICE PROGRESS NOTE  Patient Care Team: [name], MD as PCP - General (Internal Medicine)  SUMMARY OF ONCOLOGIC HISTORY:   Multiple myeloma not having achieved remission ([place])   06/23/2015 - 06/28/2015 Hospital Admission    The patient was admitted to the hospital due to gait ataxia and back pain. He was subsequently found to have cord compression underwent surgery and was discharged home      06/24/2015 Imaging    Abnormal appearance of the T6 vertebral body, highly suspicious for possible osseous metastasis. Associated pathologic fracture withup to 30% height loss. There is associated abnormal soft tissue density within the ventral epidural space,      06/24/2015 Imaging    MRI lumbar: Focal osseous lesion with abnormal enhancement involving the right pedicle of L3, suspicious for possible osseous metastasisgiven the findings in the thoracic spine. Question additional focal lesion within the right iliac wing as above.        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 Zometa is not given due to inability to get dental clearance      12/14/2015 -  Chemotherapy    He is started on maintenance treatment with Revlimid only      12/18/2015 Imaging    MRI thoracic and lumbar spine showed numerous enhancing foci throughout the thoracic and lumbar spine with several new small foci in the lumbar spine in comparison with prior MRI compatible with metastatic disease. Stable loss of height of the T3, T4, and T6 vertebral bodies and new postsurgical changes related  to T6 laminectomy. No significant epidural disease or evidence for cord compression. No abnormal enhancement of the spinal cord or cauda equina.       INTERVAL HISTORY: Please see below for problem oriented charting. He returns for further follow-up. He denies recent infection. He continues to have peripheral neuropathy pain and persistent back pain. He takes narcotic prescription twice a day. He has chronic constipation. He has not seen a dentist yet. Disability application is still pending. He denies new neurological deficits He is compliant taking Revlimid as prescribed  REVIEW OF SYSTEMS:   Constitutional: Denies fevers, chills or abnormal weight loss Eyes: Denies blurriness of vision Ears, nose, mouth, throat, and face: Denies mucositis or sore throat Respiratory: Denies cough, dyspnea or wheezes Cardiovascular: Denies palpitation, chest discomfort or lower extremity swelling Gastrointestinal:  Denies nausea, heartburn or change in bowel habits Skin: Denies abnormal skin rashes Lymphatics: Denies new lymphadenopathy or easy bruising Behavioral/Psych: Mood is stable, no new changes  All other systems were reviewed with the patient and are negative.  I have reviewed the past medical history, past surgical history, social history and family history with the patient and they are unchanged from previous note.  ALLERGIES:  has No Known Allergies.  MEDICATIONS:  Current Outpatient Prescriptions  Medication Sig Dispense Refill  . acyclovir (ZOVIRAX) 400 MG tablet TAKE 1 TABLET BY MOUTH 2 TIMES DAILY 60 tablet 3  . cholecalciferol (VITAMIN D) 1000 units tablet Take 1,000 Units by mouth daily.    Marland Kitchen  gabapentin (NEURONTIN) 300 MG capsule Take 2 capsules (600 mg total) by mouth 3 (three) times daily. 90 capsule 6  . HYDROcodone-acetaminophen (NORCO/VICODIN) 5-325 MG tablet Take 1 tablet by mouth 3 (three) times daily as needed for moderate pain. 90 tablet 0  . lenalidomide (REVLIMID)  15 MG capsule Take 1 capsule (15 mg total) by mouth daily. Take for 21 days, then 7 days off 21 capsule 0  . ondansetron (ZOFRAN) 8 MG tablet Take 1 tablet (8 mg total) by mouth every 8 (eight) hours as needed (Nausea or vomiting). 30 tablet 1  . senna-docusate (SENOKOT-S) 8.6-50 MG tablet Take 1 tablet by mouth at bedtime as needed for mild constipation. 30 tablet 0   No current facility-administered medications for this visit.     PHYSICAL EXAMINATION: ECOG PERFORMANCE STATUS: 1 - Symptomatic but completely ambulatory  Vitals:   01/11/16 1255  BP: (!) 143/94  Pulse: 66  Resp: 18  Temp: 98.2 F (36.8 C)   Filed Weights   01/11/16 1255  Weight: 223 lb 11.2 oz (101.5 kg)    GENERAL:alert, no distress and comfortable SKIN: skin color, texture, turgor are normal, no rashes or significant lesions EYES: normal, Conjunctiva are pink and non-injected, sclera clear OROPHARYNX:no exudate, no erythema and lips, buccal mucosa, and tongue normal  NECK: supple, thyroid normal size, non-tender, without nodularity LYMPH:  no palpable lymphadenopathy in the cervical, axillary or inguinal LUNGS: clear to auscultation and percussion with normal breathing effort HEART: regular rate & rhythm and no murmurs and no lower extremity edema ABDOMEN:abdomen soft, non-tender and normal bowel sounds Musculoskeletal:no cyanosis of digits and no clubbing  NEURO: alert & oriented x 3 with fluent speech, no focal motor/sensory deficits  LABORATORY DATA:  I have reviewed the data as listed    Component Value Date/Time   NA 142 01/11/2016 1243   K 3.4 (L) 01/11/2016 1243   CL 110 07/20/2015 0755   CO2 26 01/11/2016 1243   GLUCOSE 88 01/11/2016 1243   BUN 12.3 01/11/2016 1243   CREATININE 1.0 01/11/2016 1243   CALCIUM 9.4 01/11/2016 1243   PROT 6.7 01/11/2016 1243   ALBUMIN 3.8 01/11/2016 1243   AST 15 01/11/2016 1243   ALT 18 01/11/2016 1243   ALKPHOS 54 01/11/2016 1243   BILITOT 0.87 01/11/2016  1243   GFRNONAA 49 (L) 07/20/2015 0755   GFRAA 56 (L) 07/20/2015 0755    No results found for: SPEP, UPEP  Lab Results  Component Value Date   WBC 5.0 01/11/2016   NEUTROABS 2.4 01/11/2016   HGB 12.8 (L) 01/11/2016   HCT 36.9 (L) 01/11/2016   MCV 84.6 01/11/2016   PLT 231 01/11/2016      Chemistry      Component Value Date/Time   NA 142 01/11/2016 1243   K 3.4 (L) 01/11/2016 1243   CL 110 07/20/2015 0755   CO2 26 01/11/2016 1243   BUN 12.3 01/11/2016 1243   CREATININE 1.0 01/11/2016 1243      Component Value Date/Time   CALCIUM 9.4 01/11/2016 1243   ALKPHOS 54 01/11/2016 1243   AST 15 01/11/2016 1243   ALT 18 01/11/2016 1243   BILITOT 0.87 01/11/2016 1243       RADIOGRAPHIC STUDIES: I have personally reviewed the radiological images as listed and agreed with the findings in the report. Mr Thoracic Spine W Wo Contrast  Result Date: 12/18/2015 CLINICAL DATA:  45 y/o M; myeloma with back pain and history of compression fractures for follow-up.  EXAM: MRI THORACIC AND LUMBAR SPINE WITHOUT AND WITH CONTRAST TECHNIQUE: Multiplanar and multiecho pulse sequences of the thoracic and lumbar spine were obtained without and with intravenous contrast. CONTRAST:  70m MULTIHANCE GADOBENATE DIMEGLUMINE 529 MG/ML IV SOLN COMPARISON:  06/24/2015 CT of chest, abdomen and pelvis. 06/23/2015 lumbar spine MRI. FINDINGS: MRI THORACIC SPINE FINDINGS Alignment: Normal thoracic kyphosis with slight exaggeration at T6 due to vertebral body compression deformity. No listhesis. Vertebrae: There are diffuse enhancing osseous metastasis throughout the thoracic spine and visualized ribs with the largest lesions at the T4, T6, and T10 levels. Status post T6 laminectomy. There is T3 superior endplate deformity, mild loss of height of the T4 vertebral body, moderate anterior compression deformity at T6, unchanged in comparison with prior CT. T6 metastasis slightly widened but right pedicle with effacement of  right anterior thecal sac. Otherwise no significant epidural disease. No cord compression. Pedicle expansion of the T6 vertebral body also partially effaces the T6-7 neural foramina bilaterally. The left T9 superior facet metastasis partially effaces the left T8-9 neural foramen. Cord:  No abnormal cord signal or enhancement. Paraspinal and other soft tissues: Postsurgical changes in the paraspinal muscles and overlying soft tissues of the T6 level without a discrete fluid collection. Disc levels: No high-grade canal stenosis or foraminal narrowing. MRI LUMBAR SPINE FINDINGS Segmentation:  Standard. Alignment:  Physiologic. Vertebrae: Right L3 pedicle enhancing lesion likely metastasis is stable. In comparison with the prior study there are several new subcentimeter enhancing foci throughout the lumbar spine and visualized sacrum likely representing metastatic disease. There is no loss of vertebral body height or evidence for fracture. Conus medullaris: Extends to the L1-2 level and appears normal. No abnormal enhancement of colon is or cauda equina. Paraspinal and other soft tissues: Negative. Disc levels: L1-2: No significant disc displacement, foraminal narrowing, or canal stenosis. L2-3: No significant disc displacement, foraminal narrowing, or canal stenosis. L3-4: Small disc bulge without significant foraminal narrowing or canal stenosis. L4-5: Moderate disc bulge and right subarticular disc protrusion. Mild right-greater-than-left foraminal narrowing. Effacement of lateral recesses with contact upon bilateral descending L5 nerve roots greater on the right. Mild-to-moderate canal stenosis. L5-S1: Disc bulge eccentric to the right, small central protrusion, and central annular fissure. Moderate right and mild left foraminal narrowing. No significant canal stenosis. IMPRESSION: 1. Numerous enhancing foci throughout the thoracic and lumbar spine with several new small foci in the lumbar spine in comparison with  prior MRI compatible with metastatic disease. 2. Stable loss of height of the T3, T4, and T6 vertebral bodies and new postsurgical changes related to T6 laminectomy. 3. No significant epidural disease or evidence for cord compression. 4. No abnormal enhancement of the spinal cord or cauda equina. Electronically Signed   By: LKristine GarbeM.D.   On: 12/18/2015 14:35   Mr Lumbar Spine W Wo Contrast  Result Date: 12/18/2015 CLINICAL DATA:  45y/o M; myeloma with back pain and history of compression fractures for follow-up. EXAM: MRI THORACIC AND LUMBAR SPINE WITHOUT AND WITH CONTRAST TECHNIQUE: Multiplanar and multiecho pulse sequences of the thoracic and lumbar spine were obtained without and with intravenous contrast. CONTRAST:  251mMULTIHANCE GADOBENATE DIMEGLUMINE 529 MG/ML IV SOLN COMPARISON:  06/24/2015 CT of chest, abdomen and pelvis. 06/23/2015 lumbar spine MRI. FINDINGS: MRI THORACIC SPINE FINDINGS Alignment: Normal thoracic kyphosis with slight exaggeration at T6 due to vertebral body compression deformity. No listhesis. Vertebrae: There are diffuse enhancing osseous metastasis throughout the thoracic spine and visualized ribs with the largest lesions  at the T4, T6, and T10 levels. Status post T6 laminectomy. There is T3 superior endplate deformity, mild loss of height of the T4 vertebral body, moderate anterior compression deformity at T6, unchanged in comparison with prior CT. T6 metastasis slightly widened but right pedicle with effacement of right anterior thecal sac. Otherwise no significant epidural disease. No cord compression. Pedicle expansion of the T6 vertebral body also partially effaces the T6-7 neural foramina bilaterally. The left T9 superior facet metastasis partially effaces the left T8-9 neural foramen. Cord:  No abnormal cord signal or enhancement. Paraspinal and other soft tissues: Postsurgical changes in the paraspinal muscles and overlying soft tissues of the T6 level  without a discrete fluid collection. Disc levels: No high-grade canal stenosis or foraminal narrowing. MRI LUMBAR SPINE FINDINGS Segmentation:  Standard. Alignment:  Physiologic. Vertebrae: Right L3 pedicle enhancing lesion likely metastasis is stable. In comparison with the prior study there are several new subcentimeter enhancing foci throughout the lumbar spine and visualized sacrum likely representing metastatic disease. There is no loss of vertebral body height or evidence for fracture. Conus medullaris: Extends to the L1-2 level and appears normal. No abnormal enhancement of colon is or cauda equina. Paraspinal and other soft tissues: Negative. Disc levels: L1-2: No significant disc displacement, foraminal narrowing, or canal stenosis. L2-3: No significant disc displacement, foraminal narrowing, or canal stenosis. L3-4: Small disc bulge without significant foraminal narrowing or canal stenosis. L4-5: Moderate disc bulge and right subarticular disc protrusion. Mild right-greater-than-left foraminal narrowing. Effacement of lateral recesses with contact upon bilateral descending L5 nerve roots greater on the right. Mild-to-moderate canal stenosis. L5-S1: Disc bulge eccentric to the right, small central protrusion, and central annular fissure. Moderate right and mild left foraminal narrowing. No significant canal stenosis. IMPRESSION: 1. Numerous enhancing foci throughout the thoracic and lumbar spine with several new small foci in the lumbar spine in comparison with prior MRI compatible with metastatic disease. 2. Stable loss of height of the T3, T4, and T6 vertebral bodies and new postsurgical changes related to T6 laminectomy. 3. No significant epidural disease or evidence for cord compression. 4. No abnormal enhancement of the spinal cord or cauda equina. Electronically Signed   By: Kristine Garbe M.D.   On: 12/18/2015 14:35    ASSESSMENT & PLAN:  Multiple myeloma not having achieved remission  ([place]) Unfortunately, due to lack of insurance, I cannot refer him to transplant centers I will enlist the help of our financial navigator to help him apply for some form of insurance For now, he will continue Revlimid maintenance treatment at 15 mg, days 1-21, rest 7 days. He will continue calcium, vitamin D, acyclovir and aspirin therapy Due to lack of insurance, he was not able to get dental clearance. 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 Clinically, he is relatively asymptomatic apart from sensation of tightness. He has some altered sensation down his legs but nothing to suggest imminent cord compression. He has no appreciable weakness in both lower extremities His recent MRI is reviewed with the patient. His case was discussed at the most recent CNS oncology tumor board. Continue close observation and monitoring.  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 Continue the same dose for now  Cancer associated pain This  is stable I refilled his prescriptions for pain medication today We discussed narcotic refill policy  Constipation, chronic He has chronic constipation and is not taking laxatives regularly. I recommend over-the-counter laxatives   Orders Placed This Encounter  Procedures  . Kappa/lambda light chains    Standing Status:   Future    Standing Expiration Date:   02/14/2017  . Multiple Myeloma Panel (SPEP&IFE w/QIG)    Standing Status:   Future    Standing Expiration Date:   02/14/2017   All questions were answered. The patient knows to call the clinic with any problems, questions or concerns. No barriers to learning was detected. I spent 20 minutes counseling the patient face to face.  The total time spent in the appointment was 30 minutes and more than 50% was on counseling and review of test results     [name], MD 01/11/2016 1:50 PM

## 2016-01-11 NOTE — Assessment & Plan Note (Addendum)
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 Continue the same dose for now 

## 2016-01-11 NOTE — Telephone Encounter (Signed)
Appointments scheduled per 01/11/16 los. Patient was given a copy of the AVS report and appointment schedule, per 01/11/16 los. °

## 2016-01-11 NOTE — Assessment & Plan Note (Signed)
He has chronic constipation and is not taking laxatives regularly. I recommend over-the-counter laxatives

## 2016-01-11 NOTE — Assessment & Plan Note (Signed)
This is stable I refilled his prescriptions for pain medication today We discussed narcotic refill policy

## 2016-01-13 LAB — KAPPA/LAMBDA LIGHT CHAINS
Ig Kappa Free Light Chain: 10 mg/L (ref 3.3–19.4)
Ig Lambda Free Light Chain: 160.8 mg/L — ABNORMAL HIGH (ref 5.7–26.3)
KAPPA/LAMBDA FLC RATIO: 0.06 — AB (ref 0.26–1.65)

## 2016-01-16 LAB — MULTIPLE MYELOMA PANEL, SERUM
ALBUMIN SERPL ELPH-MCNC: 3.9 g/dL (ref 2.9–4.4)
ALBUMIN/GLOB SERPL: 1.8 — AB (ref 0.7–1.7)
ALPHA 1: 0.2 g/dL (ref 0.0–0.4)
ALPHA2 GLOB SERPL ELPH-MCNC: 0.6 g/dL (ref 0.4–1.0)
B-GLOBULIN SERPL ELPH-MCNC: 0.8 g/dL (ref 0.7–1.3)
GAMMA GLOB SERPL ELPH-MCNC: 0.5 g/dL (ref 0.4–1.8)
GLOBULIN, TOTAL: 2.2 g/dL (ref 2.2–3.9)
IGA/IMMUNOGLOBULIN A, SERUM: 53 mg/dL — AB (ref 90–386)
IGG (IMMUNOGLOBIN G), SERUM: 736 mg/dL (ref 700–1600)
IgM, Qn, Serum: 32 mg/dL (ref 20–172)
Total Protein: 6.1 g/dL (ref 6.0–8.5)

## 2016-01-17 ENCOUNTER — Telehealth: Payer: Self-pay | Admitting: Hematology and Oncology

## 2016-01-17 NOTE — Telephone Encounter (Signed)
I spoke with the patient over the telephone. His serum light chain has started to go up. I warned him potential signs of disease progression. If the repeat set of blood work next month confirm disease progression, I will consider switching him to other formal treatment. He understands the implication of the abnormal test results.-

## 2016-01-19 ENCOUNTER — Telehealth: Payer: Self-pay | Admitting: Pharmacist

## 2016-01-19 NOTE — Telephone Encounter (Signed)
Oral Chemotherapy Pharmacist Encounter  Received notification from [place] Patient Assistance Program that it was time to re-enroll patient into their program for 2018 to continue to receive free Revlomid from the manufacturer. Application completed and faxed to [employer] P[employer] at [phone]  This encounter will continue to be updated until final determination.  Oral Oncology Clinic will continue to follow.   Johny Drilling, PharmD, BCPS, BCOP 01/19/2016  3:02 PM Oral Oncology Clinic [phone]

## 2016-01-23 MED FILL — HYDROCODON-APAP 5-325: 5-325 | 30 days supply | Qty: 90 | Fill #0

## 2016-01-23 MED FILL — ACYCLOVIR 400 MG TABLET: 400 | 30 days supply | Qty: 60 | Fill #2

## 2016-01-24 ENCOUNTER — Encounter: Payer: Self-pay | Admitting: Hematology and Oncology

## 2016-01-24 NOTE — Progress Notes (Signed)
Patient came in office today to bring Medicaid and Social Security denial letters. Called [name](Financial Resource Specialist counselor on hospital side to explain to patient the denial and appeal process. She  throroughly explained to the patient what to expect and what is needed from him. I will have physician write letter exlpaining his situation for him to take with him to hearing. I will contact patient when letter is ready for him to pick up. Patient verbalized understanding and has my card for any additional financial questions or concerns.

## 2016-01-31 ENCOUNTER — Other Ambulatory Visit: Payer: Self-pay | Admitting: *Deleted

## 2016-01-31 MED ORDER — LENALIDOMIDE 15 MG PO CAPS: 15.0000 mg | ORAL_CAPSULE | Freq: Every day | ORAL | 0 refills | Status: DC

## 2016-02-12 ENCOUNTER — Other Ambulatory Visit (HOSPITAL_BASED_OUTPATIENT_CLINIC_OR_DEPARTMENT_OTHER): Payer: Medicaid Other

## 2016-02-12 DIAGNOSIS — C9000 Multiple myeloma not having achieved remission: Secondary | ICD-10-CM

## 2016-02-12 LAB — COMPREHENSIVE METABOLIC PANEL
ALT: 22 U/L (ref 0–55)
AST: 18 U/L (ref 5–34)
Albumin: 4 g/dL (ref 3.5–5.0)
Alkaline Phosphatase: 53 U/L (ref 40–150)
Anion Gap: 9 mEq/L (ref 3–11)
BUN: 18.5 mg/dL (ref 7.0–26.0)
CHLORIDE: 107 meq/L (ref 98–109)
CO2: 26 mEq/L (ref 22–29)
Calcium: 9.8 mg/dL (ref 8.4–10.4)
Creatinine: 1 mg/dL (ref 0.7–1.3)
EGFR: >90 mL/min/{1.73_m2} (ref >90)
GLUCOSE: 75 mg/dL (ref 70–140)
POTASSIUM: 3.9 meq/L (ref 3.5–5.1)
SODIUM: 142 meq/L (ref 136–145)
Total Bilirubin: 0.7 mg/dL (ref 0.20–1.20)
Total Protein: 6.7 g/dL (ref 6.4–8.3)

## 2016-02-12 LAB — CBC WITH DIFFERENTIAL/PLATELET
BASO%: 1.9 % (ref 0.0–2.0)
Basophils Absolute: 0.1 10*3/uL (ref 0.0–0.1)
EOS%: 7.7 % — ABNORMAL HIGH (ref 0.0–7.0)
Eosinophils Absolute: 0.3 10*3/uL (ref 0.0–0.5)
HCT: 37.8 % — ABNORMAL LOW (ref 38.4–49.9)
HGB: 13 g/dL (ref 13.0–17.1)
LYMPH%: 27.4 % (ref 14.0–49.0)
MCH: 29.5 pg (ref 27.2–33.4)
MCHC: 34.4 g/dL (ref 32.0–36.0)
MCV: 85.7 fL (ref 79.3–98.0)
MONO#: 0.6 10*3/uL (ref 0.1–0.9)
MONO%: 13.8 % (ref 0.0–14.0)
NEUT#: 2 10*3/uL (ref 1.5–6.5)
NEUT%: 49.2 % (ref 39.0–75.0)
Platelets: 225 10*3/uL (ref 140–400)
RBC: 4.41 10*6/uL (ref 4.20–5.82)
RDW: 13.8 % (ref 11.0–14.6)
WBC: 4.1 10*3/uL (ref 4.0–10.3)
lymph#: 1.1 10*3/uL (ref 0.9–3.3)

## 2016-02-13 LAB — KAPPA/LAMBDA LIGHT CHAINS
IG LAMBDA FREE LIGHT CHAIN: 160.3 mg/L — AB (ref 5.7–26.3)
Ig Kappa Free Light Chain: 14.9 mg/L (ref 3.3–19.4)
Kappa/Lambda FluidC Ratio: 0.09 — ABNORMAL LOW (ref 0.26–1.65)

## 2016-02-14 LAB — MULTIPLE MYELOMA PANEL, SERUM
Albumin SerPl Elph-Mcnc: 3.8 g/dL (ref 2.9–4.4)
Albumin/Glob SerPl: 1.5 (ref 0.7–1.7)
Alpha 1: 0.2 g/dL (ref 0.0–0.4)
Alpha2 Glob SerPl Elph-Mcnc: 0.6 g/dL (ref 0.4–1.0)
B-GLOBULIN SERPL ELPH-MCNC: 0.9 g/dL (ref 0.7–1.3)
Gamma Glob SerPl Elph-Mcnc: 0.9 g/dL (ref 0.4–1.8)
Globulin, Total: 2.6 g/dL (ref 2.2–3.9)
IGM (IMMUNOGLOBIN M), SRM: 30 mg/dL (ref 20–172)
IgA, Qn, Serum: 82 mg/dL — ABNORMAL LOW (ref 90–386)
IgG, Qn, Serum: 893 mg/dL (ref 700–1600)
TOTAL PROTEIN: 6.4 g/dL (ref 6.0–8.5)

## 2016-02-16 ENCOUNTER — Telehealth: Payer: Self-pay

## 2016-02-16 NOTE — Telephone Encounter (Signed)
Received fax from [employer] Patient Support that patient has been approved thru 08/12/16 for assistance.  Thank you  Henreitta Leber, PharmD Oral Oncology Navigation Clinic

## 2016-02-19 ENCOUNTER — Encounter: Payer: Self-pay | Admitting: Hematology and Oncology

## 2016-02-19 ENCOUNTER — Other Ambulatory Visit: Payer: Self-pay | Admitting: *Deleted

## 2016-02-19 ENCOUNTER — Ambulatory Visit (HOSPITAL_BASED_OUTPATIENT_CLINIC_OR_DEPARTMENT_OTHER): Payer: Medicaid Other | Admitting: Hematology and Oncology

## 2016-02-19 ENCOUNTER — Telehealth: Payer: Self-pay | Admitting: Hematology and Oncology

## 2016-02-19 VITALS — BP 131/73 | HR 75 | Temp 98.1°F | Resp 18 | Ht 75.0 in | Wt 224.1 lb

## 2016-02-19 DIAGNOSIS — G9529 Other cord compression: Secondary | ICD-10-CM

## 2016-02-19 DIAGNOSIS — G893 Neoplasm related pain (acute) (chronic): Secondary | ICD-10-CM | POA: Diagnosis not present

## 2016-02-19 DIAGNOSIS — C9000 Multiple myeloma not having achieved remission: Secondary | ICD-10-CM

## 2016-02-19 DIAGNOSIS — G620 Drug-induced polyneuropathy: Secondary | ICD-10-CM | POA: Diagnosis not present

## 2016-02-19 DIAGNOSIS — G9520 Unspecified cord compression: Secondary | ICD-10-CM | POA: Diagnosis not present

## 2016-02-19 DIAGNOSIS — T451X5A Adverse effect of antineoplastic and immunosuppressive drugs, initial encounter: Secondary | ICD-10-CM

## 2016-02-19 DIAGNOSIS — C7951 Secondary malignant neoplasm of bone: Secondary | ICD-10-CM

## 2016-02-19 MED ORDER — LENALIDOMIDE 15 MG PO CAPS: 15.0000 mg | ORAL_CAPSULE | Freq: Every day | ORAL | 0 refills | Status: DC

## 2016-02-19 NOTE — Assessment & Plan Note (Signed)
This is stable He will continue pain medication as prescribed 

## 2016-02-19 NOTE — Assessment & Plan Note (Signed)
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 Continue the same dose for now 

## 2016-02-19 NOTE — Telephone Encounter (Signed)
Appointments scheduled per 1/22 LOS. Patient given AVS report and calendars with future scheduled appointments.  °

## 2016-02-19 NOTE — Progress Notes (Signed)
Received email from [facility] w/ [employer] of patient continued approval for Revlimid for another 6 months until 08/12/16 for drug replacement. Forwarded email to drug replacement communication team and Arbie Cookey in pharmacy. Copy placed to be scanned by HIM.

## 2016-02-19 NOTE — Telephone Encounter (Signed)
Oral Chemotherapy Pharmacist Encounter  Received fax from [employer] Patient Support that patient has been approved thru 08/12/16 for assistance.  [employer] Patient support phone number for questions [phone]. [phone] for patient support specialist Mellody Life.  Oral Oncology Clinic will sign off at this time. Please let us know if we can be of assistance in the future.  Johny Drilling, PharmD, BCPS, BCOP 02/19/2016  2:10 PM Oral Oncology Clinic [phone]

## 2016-02-19 NOTE — Assessment & Plan Note (Signed)
He has completed palliative radiation therapy. Clinically, he is relatively asymptomatic apart from sensation of tightness. He has some altered sensation down his legs but nothing to suggest imminent cord compression. He has no appreciable weakness in both lower extremities His recent MRI is reviewed with the patient. His case was discussed at the most recent CNS oncology tumor board. Continue close observation and monitoring. I plan to repeat MRI in 6 months, due May 2018

## 2016-02-19 NOTE — Progress Notes (Signed)
Patient came in today to bring updated letter for his Medicaid. Per letter decision was reversed, and patient is found eligible for disability Medicaid beginning May 2017.   I went to meet with [name](Financial Counselor) on hospital side to make sure I understood letter correctly. She confirmed and attempted to locate id number for patient but it was not available as of yet. [name] will let me know once this is done. I communicated via staff message to physician and advised I would follow up once number is available in system and or patient receives a card so that she may assist further.  Patient has my card for any additional financial questions or concerns. Copy of letter will be sent to HIM to be scanned.

## 2016-02-19 NOTE — Assessment & Plan Note (Addendum)
Unfortunately, due to lack of insurance, I cannot refer him to transplant centers The patient recently has Medicaid approval He will call me as soon as he had the information He would need dental clearance to proceed with Zometa once he has insurance His last set of blood work shows stable disease He tolerated chemotherapy well.  For now, he will continue Revlimid at 15 mg, days 1-21, rest 7 days. He will continue calcium, vitamin D, acyclovir and aspirin therapy If the next set of blood work showed no evidence of improvement, I plan to modify his treatment by adding Kyprolis The patient remain disabled from his cancer and not able to work. I wrote a letter to support that

## 2016-02-19 NOTE — Progress Notes (Signed)
[facility] OFF[facility] PROGRESS NOTE  Patient Care Team: [name], MD as PCP - General (Internal Medicine)  SUMMARY OF ONCOLOGIC HISTORY:   Multiple myeloma not having achieved remission ([place])   06/23/2015 - 06/28/2015 Hospital Admission    The patient was admitted to the hospital due to gait ataxia and back pain. He was subsequently found to have cord compression underwent surgery and was discharged home      06/24/2015 Imaging    Abnormal appearance of the T6 vertebral body, highly suspicious for possible osseous metastasis. Associated pathologic fracture withup to 30% height loss. There is associated abnormal soft tissue density within the ventral epidural space,      06/24/2015 Imaging    MRI lumbar: Focal osseous lesion with abnormal enhancement involving the right pedicle of L3, suspicious for possible osseous metastasisgiven the findings in the thoracic spine. Question additional focal lesion within the right iliac wing as above.        06/25/2015 Pathology Results    Accession: [id number] bone biopsy come from plasma cell neoplasm.      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 Zometa is not given due to inability to get dental clearance      12/14/2015 -  Chemotherapy    He is started on maintenance treatment with Revlimid only      12/18/2015 Imaging    MRI thoracic and lumbar spine showed numerous enhancing foci throughout the thoracic and lumbar spine with several new small foci in the lumbar spine in comparison with prior MRI compatible with metastatic disease. Stable loss of height of the T3, T4, and T6 vertebral bodies and new postsurgical changes related  to T6 laminectomy. No significant epidural disease or evidence for cord compression. No abnormal enhancement of the spinal cord or cauda equina.       INTERVAL HISTORY: Please see below for problem oriented charting. He returns today for further follow-up He denies recent infection He continues to have peripheral neuropathy affecting his lower extremities but denies new neurological deficit He brought with him several different letters and it appears that the patient finally has approval from Medicaid His cancer pain is stable with current prescription pain medicine  REVIEW OF SYSTEMS:   Constitutional: Denies fevers, chills or abnormal weight loss Eyes: Denies blurriness of vision Ears, nose, mouth, throat, and face: Denies mucositis or sore throat Respiratory: Denies cough, dyspnea or wheezes Cardiovascular: Denies palpitation, chest discomfort or lower extremity swelling Gastrointestinal:  Denies nausea, heartburn or change in bowel habits Skin: Denies abnormal skin rashes Lymphatics: Denies new lymphadenopathy or easy bruising Neurological:Denies numbness, tingling or new weaknesses Behavioral/Psych: Mood is stable, no new changes  All other systems were reviewed with the patient and are negative.  I have reviewed the past medical history, past surgical history, social history and family history with the patient and they are unchanged from previous note.  ALLERGIES:  has No Known Allergies.  MEDICATIONS:  Current Outpatient Prescriptions  Medication Sig Dispense Refill  . acyclovir (ZOVIRAX) 400 MG tablet TAKE 1 TABLET BY MOUTH 2 TIMES DAILY 60 tablet 3  . aspirin EC 81 MG tablet Take 81 mg by mouth  daily.    . cholecalciferol (VITAMIN D) 1000 units tablet Take 1,000 Units by mouth daily.    Marland Kitchen gabapentin (NEURONTIN) 300 MG capsule Take 2 capsules (600 mg total) by mouth 3 (three) times daily. 90 capsule 6  . HYDROcodone-acetaminophen (NORCO/VICODIN) 5-325 MG tablet Take 1  tablet by mouth 3 (three) times daily as needed for moderate pain. 90 tablet 0  . lenalidomide (REVLIMID) 15 MG capsule Take 1 capsule (15 mg total) by mouth daily. Take for 21 days, then 7 days off 21 capsule 0  . ondansetron (ZOFRAN) 8 MG tablet Take 1 tablet (8 mg total) by mouth every 8 (eight) hours as needed (Nausea or vomiting). 30 tablet 1  . senna-docusate (SENOKOT-S) 8.6-50 MG tablet Take 1 tablet by mouth at bedtime as needed for mild constipation. 30 tablet 0   No current facility-administered medications for this visit.     PHYSICAL EXAMINATION: ECOG PERFORMANCE STATUS: 1 - Symptomatic but completely ambulatory  Vitals:   02/19/16 1041  BP: 131/73  Pulse: 75  Resp: 18  Temp: 98.1 F (36.7 C)   Filed Weights   02/19/16 1041  Weight: 224 lb 1.6 oz (101.7 kg)    GENERAL:alert, no distress and comfortable SKIN: skin color, texture, turgor are normal, no rashes or significant lesions EYES: normal, Conjunctiva are pink and non-injected, sclera clear OROPHARYNX:no exudate, no erythema and lips, buccal mucosa, and tongue normal  NECK: supple, thyroid normal size, non-tender, without nodularity LYMPH:  no palpable lymphadenopathy in the cervical, axillary or inguinal LUNGS: clear to auscultation and percussion with normal breathing effort HEART: regular rate & rhythm and no murmurs and no lower extremity edema ABDOMEN:abdomen soft, non-tender and normal bowel sounds Musculoskeletal:no cyanosis of digits and no clubbing  NEURO: alert & oriented x 3 with fluent speech, no focal motor/sensory deficits  LABORATORY DATA:  I have reviewed the data as listed    Component Value Date/Time   NA 142 02/12/2016 1054   K 3.9 02/12/2016 1054   CL 110 07/20/2015 0755   CO2 26 02/12/2016 1054   GLUCOSE 75 02/12/2016 1054   BUN 18.5 02/12/2016 1054   CREATININE 1.0 02/12/2016 1054   CALCIUM 9.8 02/12/2016 1054   PROT 6.4 02/12/2016 1054   PROT 6.7 02/12/2016 1054   ALBUMIN 4.0  02/12/2016 1054   AST 18 02/12/2016 1054   ALT 22 02/12/2016 1054   ALKPHOS 53 02/12/2016 1054   BILITOT 0.70 02/12/2016 1054   GFRNONAA 49 (L) 07/20/2015 0755   GFRAA 56 (L) 07/20/2015 0755    No results found for: SPEP, UPEP  Lab Results  Component Value Date   WBC 4.1 02/12/2016   NEUTROABS 2.0 02/12/2016   HGB 13.0 02/12/2016   HCT 37.8 (L) 02/12/2016   MCV 85.7 02/12/2016   PLT 225 02/12/2016      Chemistry      Component Value Date/Time   NA 142 02/12/2016 1054   K 3.9 02/12/2016 1054   CL 110 07/20/2015 0755   CO2 26 02/12/2016 1054   BUN 18.5 02/12/2016 1054   CREATININE 1.0 02/12/2016 1054      Component Value Date/Time   CALCIUM 9.8 02/12/2016 1054   ALKPHOS 53 02/12/2016 1054   AST 18 02/12/2016 1054   ALT 22 02/12/2016 1054   BILITOT 0.70 02/12/2016 1054      ASSESSMENT & PLAN:  Multiple myeloma not having achieved remission ([place]) Unfortunately, due to lack of insurance, I cannot refer him to transplant centers  The patient recently has Medicaid approval He will call me as soon as he had the information He would need dental clearance to proceed with Zometa once he has insurance His last set of blood work shows stable disease He tolerated chemotherapy well.  For now, he will continue Revlimid at 15 mg, days 1-21, rest 7 days. He will continue calcium, vitamin D, acyclovir and aspirin therapy If the next set of blood work showed no evidence of improvement, I plan to modify his treatment by adding Kyprolis The patient remain disabled from his cancer and not able to work. 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 Clinically, he is relatively asymptomatic apart from sensation of tightness. He has some altered sensation down his legs but nothing to suggest imminent cord compression. He has no appreciable weakness in both lower extremities His recent MRI is reviewed with the patient. His case was discussed at the most recent CNS oncology tumor board. Continue close observation and monitoring. I plan to repeat MRI in 6 months, due May 2018   Orders Placed This Encounter  Procedures  . Kappa/lambda light chains    Standing Status:   Future    Standing Expiration Date:   03/25/2017  . Multiple Myeloma Panel (SPEP&IFE w/QIG)    Standing Status:   Future    Standing Expiration Date:   03/25/2017   All questions were answered. The patient knows to call the clinic with any problems, questions or concerns. No barriers to learning was detected. I spent 25 minutes counseling the patient face to face. The total time spent in the appointment was 30 minutes and more than 50% was on counseling and review of test results     [name], MD 02/19/2016 11:26 AM

## 2016-02-21 ENCOUNTER — Other Ambulatory Visit: Payer: Self-pay

## 2016-02-21 MED ORDER — LENALIDOMIDE 15 MG PO CAPS: 15.0000 mg | ORAL_CAPSULE | Freq: Every day | ORAL | 0 refills | Status: DC

## 2016-02-21 NOTE — Telephone Encounter (Signed)
Received call from [facility] requesting a new authorization number for Revlimid rx. New auth # obtained and resubmitted script electronically.

## 2016-02-22 MED FILL — ACYCLOVIR 400 MG TABLET: 400 | 30 days supply | Qty: 60 | Fill #3

## 2016-03-11 ENCOUNTER — Other Ambulatory Visit (HOSPITAL_BASED_OUTPATIENT_CLINIC_OR_DEPARTMENT_OTHER): Payer: Medicaid Other

## 2016-03-11 DIAGNOSIS — C9000 Multiple myeloma not having achieved remission: Secondary | ICD-10-CM | POA: Diagnosis present

## 2016-03-11 LAB — CBC WITH DIFFERENTIAL/PLATELET
BASO%: 0.2 % (ref 0.0–2.0)
Basophils Absolute: 0 10*3/uL (ref 0.0–0.1)
EOS ABS: 0.2 10*3/uL (ref 0.0–0.5)
EOS%: 4.5 % (ref 0.0–7.0)
HCT: 39.8 % (ref 38.4–49.9)
HGB: 13.6 g/dL (ref 13.0–17.1)
LYMPH%: 26.8 % (ref 14.0–49.0)
MCH: 30.4 pg (ref 27.2–33.4)
MCHC: 34.2 g/dL (ref 32.0–36.0)
MCV: 89.1 fL (ref 79.3–98.0)
MONO#: 0.6 10*3/uL (ref 0.1–0.9)
MONO%: 15.7 % — AB (ref 0.0–14.0)
NEUT%: 52.8 % (ref 39.0–75.0)
NEUTROS ABS: 1.9 10*3/uL (ref 1.5–6.5)
Platelets: 201 10*3/uL (ref 140–400)
RBC: 4.46 10*6/uL (ref 4.20–5.82)
RDW: 14.6 % (ref 11.0–14.6)
WBC: 3.6 10*3/uL — AB (ref 4.0–10.3)
lymph#: 1 10*3/uL (ref 0.9–3.3)

## 2016-03-11 LAB — COMPREHENSIVE METABOLIC PANEL
ALT: 19 U/L (ref 0–55)
ANION GAP: 7 meq/L (ref 3–11)
AST: 17 U/L (ref 5–34)
Albumin: 4 g/dL (ref 3.5–5.0)
Alkaline Phosphatase: 53 U/L (ref 40–150)
BUN: 15.4 mg/dL (ref 7.0–26.0)
CHLORIDE: 107 meq/L (ref 98–109)
CO2: 29 meq/L (ref 22–29)
Calcium: 9.7 mg/dL (ref 8.4–10.4)
Creatinine: 1.1 mg/dL (ref 0.7–1.3)
EGFR: 81 mL/min/{1.73_m2} — AB (ref >90)
GLUCOSE: 104 mg/dL (ref 70–140)
POTASSIUM: 3.8 meq/L (ref 3.5–5.1)
SODIUM: 142 meq/L (ref 136–145)
Total Bilirubin: 0.98 mg/dL (ref 0.20–1.20)
Total Protein: 6.9 g/dL (ref 6.4–8.3)

## 2016-03-12 LAB — KAPPA/LAMBDA LIGHT CHAINS
IG LAMBDA FREE LIGHT CHAIN: 148.6 mg/L — AB (ref 5.7–26.3)
Ig Kappa Free Light Chain: 15.7 mg/L (ref 3.3–19.4)
KAPPA/LAMBDA FLC RATIO: 0.11 — AB (ref 0.26–1.65)

## 2016-03-14 LAB — MULTIPLE MYELOMA PANEL, SERUM
ALBUMIN/GLOB SERPL: 1.5 (ref 0.7–1.7)
ALPHA 1: 0.2 g/dL (ref 0.0–0.4)
Albumin SerPl Elph-Mcnc: 3.8 g/dL (ref 2.9–4.4)
Alpha2 Glob SerPl Elph-Mcnc: 0.6 g/dL (ref 0.4–1.0)
B-Globulin SerPl Elph-Mcnc: 0.9 g/dL (ref 0.7–1.3)
Gamma Glob SerPl Elph-Mcnc: 0.9 g/dL (ref 0.4–1.8)
Globulin, Total: 2.7 g/dL (ref 2.2–3.9)
IGA/IMMUNOGLOBULIN A, SERUM: 74 mg/dL — AB (ref 90–386)
IGM (IMMUNOGLOBIN M), SRM: 36 mg/dL (ref 20–172)
IgG, Qn, Serum: 959 mg/dL (ref 700–1600)
Total Protein: 6.5 g/dL (ref 6.0–8.5)

## 2016-03-18 ENCOUNTER — Telehealth: Payer: Self-pay | Admitting: Hematology and Oncology

## 2016-03-18 ENCOUNTER — Telehealth: Payer: Self-pay | Admitting: *Deleted

## 2016-03-18 ENCOUNTER — Encounter: Payer: Self-pay | Admitting: Hematology and Oncology

## 2016-03-18 ENCOUNTER — Other Ambulatory Visit (HOSPITAL_COMMUNITY)
Admission: RE | Admit: 2016-03-18 | Discharge: 2016-03-18 | Disposition: A | Discharge status: Home / Self Care | Payer: Medicaid Other | Source: Ambulatory Visit | Attending: Hematology and Oncology | Admitting: Hematology and Oncology

## 2016-03-18 ENCOUNTER — Other Ambulatory Visit: Payer: Self-pay | Admitting: *Deleted

## 2016-03-18 ENCOUNTER — Ambulatory Visit (HOSPITAL_BASED_OUTPATIENT_CLINIC_OR_DEPARTMENT_OTHER): Payer: Medicaid Other | Admitting: Hematology and Oncology

## 2016-03-18 ENCOUNTER — Ambulatory Visit: Payer: Medicaid Other

## 2016-03-18 DIAGNOSIS — G893 Neoplasm related pain (acute) (chronic): Secondary | ICD-10-CM

## 2016-03-18 DIAGNOSIS — C9000 Multiple myeloma not having achieved remission: Secondary | ICD-10-CM

## 2016-03-18 DIAGNOSIS — G620 Drug-induced polyneuropathy: Secondary | ICD-10-CM

## 2016-03-18 DIAGNOSIS — J101 Influenza due to other identified influenza virus with other respiratory manifestations: Secondary | ICD-10-CM

## 2016-03-18 DIAGNOSIS — C7951 Secondary malignant neoplasm of bone: Secondary | ICD-10-CM

## 2016-03-18 DIAGNOSIS — T451X5A Adverse effect of antineoplastic and immunosuppressive drugs, initial encounter: Secondary | ICD-10-CM

## 2016-03-18 DIAGNOSIS — G9520 Unspecified cord compression: Secondary | ICD-10-CM | POA: Diagnosis not present

## 2016-03-18 DIAGNOSIS — G9529 Other cord compression: Secondary | ICD-10-CM

## 2016-03-18 LAB — INFLUENZA PANEL BY PCR (TYPE A & B)
INFLAPCR: POSITIVE — AB
Influenza B By PCR: NEGATIVE

## 2016-03-18 MED ORDER — HYDROCODONE-ACETAMINOPHEN 5-325 MG PO TABS: 1.0000 | ORAL_TABLET | Freq: Three times a day (TID) | ORAL | 0 refills | Status: DC | PRN

## 2016-03-18 MED ORDER — HYDROCODONE-HOMATROPINE 5-1.5 MG/5ML PO SYRP
5.0000 mL | ORAL_SOLUTION | Freq: Four times a day (QID) | ORAL | 0 refills | Status: DC | PRN
Start: 2016-03-18 — End: 2016-05-10

## 2016-03-18 MED ORDER — ACYCLOVIR 400 MG PO TABS
400.0000 mg | ORAL_TABLET | Freq: Two times a day (BID) | ORAL | 11 refills | Status: DC
Start: 2016-03-18 — End: 2017-04-14

## 2016-03-18 MED ORDER — LENALIDOMIDE 20 MG PO CAPS: 20.0000 mg | ORAL_CAPSULE | Freq: Every day | ORAL | 0 refills | Status: DC

## 2016-03-18 MED ORDER — OSELTAMIVIR PHOSPHATE 75 MG PO CAPS: 75.0000 mg | ORAL_CAPSULE | Freq: Two times a day (BID) | ORAL | 0 refills | Status: DC

## 2016-03-18 MED FILL — HYDROCODON-APAP 5-325: 5-325 | 30 days supply | Qty: 90 | Fill #0

## 2016-03-18 MED FILL — ACYCLOVIR 400 MG TABLET: 400 | 30 days supply | Qty: 60 | Fill #0

## 2016-03-18 MED FILL — HYDROCODONE-HOMATROPINE SYR: 5-1.5 | 6 days supply | Qty: 120 | Fill #0

## 2016-03-18 NOTE — Assessment & Plan Note (Signed)
This is stable He will continue pain medication as prescribed I refill his prescription pain medicine today

## 2016-03-18 NOTE — Telephone Encounter (Signed)
-----   Message from [name], MD sent at 03/18/2016 12:34 PM EST ----- Regarding: positive for flu Please prescribe 75 mg BID PO tamiflu for 5 days, no refills ----- Message ----- From: Interface, Lab In [place] Sent: 03/18/2016  12:22 PM To: [name], MD

## 2016-03-18 NOTE — Assessment & Plan Note (Signed)
He has completed palliative radiation therapy. Clinically, he is relatively asymptomatic apart from sensation of tightness. He has some altered sensation down his legs but nothing to suggest imminent cord compression. He has no appreciable weakness in both lower extremities His recent MRI is reviewed with the patient. Continue close observation and monitoring. I plan to repeat MRI in 6 months, due May 2018

## 2016-03-18 NOTE — Telephone Encounter (Signed)
Appointments scheduled per 2/19 LOS. Patient given AVS report and calendars with future scheduled appointments. °

## 2016-03-18 NOTE — Progress Notes (Signed)
[facility] OFFICE PROGRESS NOTE  Patient Care Team: [name], MD as PCP - General (Internal Medicine)  SUMMARY OF ONCOLOGIC HISTORY:   Multiple myeloma not having achieved remission ([place])   06/23/2015 - 06/28/2015 Hospital Admission    The patient was admitted to the hospital due to gait ataxia and back pain. He was subsequently found to have cord compression underwent surgery and was discharged home      06/24/2015 Imaging    Abnormal appearance of the T6 vertebral body, highly suspicious for possible osseous metastasis. Associated pathologic fracture withup to 30% height loss. There is associated abnormal soft tissue density within the ventral epidural space,      06/24/2015 Imaging    MRI lumbar: Focal osseous lesion with abnormal enhancement involving the right pedicle of L3, suspicious for possible osseous metastasisgiven the findings in the thoracic spine. Question additional focal lesion within the right iliac wing as above.        06/25/2015 Pathology Results    Accession: [id number] bone biopsy come from plasma cell neoplasm.      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 Zometa is not given due to inability to get dental clearance      12/14/2015 -  Chemotherapy    He is started on maintenance treatment with Revlimid only      12/18/2015 Imaging    MRI thoracic and lumbar spine showed numerous enhancing foci throughout the thoracic and lumbar spine with several new small foci in the lumbar spine in comparison with prior MRI compatible with metastatic disease. Stable loss of height of the T3, T4, and T6 vertebral bodies and new postsurgical changes related  to T6 laminectomy. No significant epidural disease or evidence for cord compression. No abnormal enhancement of the spinal cord or cauda equina.       INTERVAL HISTORY: Please see below for problem oriented charting. He is seen today for further follow-up and review of test results. He has Medicaid approval. He continues to complain of intermittent back pain, well controlled with current prescription pain medicine and gabapentin. He complained of new onset of cough, nasal congestion, sinus drainage and mild fever.  Has mild sore throat. Appetite is stable.  No recent weight loss.  REVIEW OF SYSTEMS:   Eyes: Denies blurriness of vision Ears, nose, mouth, throat, and face: Denies mucositis  Cardiovascular: Denies palpitation, chest discomfort or lower extremity swelling Gastrointestinal:  Denies nausea, heartburn or change in bowel habits Skin: Denies abnormal skin rashes Lymphatics: Denies new lymphadenopathy or easy bruising Neurological:Denies numbness, tingling or new weaknesses Behavioral/Psych: Mood is stable, no new changes  All other systems were reviewed with the patient and are negative.  I have reviewed the past medical history, past surgical history, social history and family history with the patient and they are unchanged from previous note.  ALLERGIES:  has No Known Allergies.  MEDICATIONS:  Current Outpatient Prescriptions  Medication Sig Dispense Refill  . acyclovir (ZOVIRAX) 400 MG tablet Take 1 tablet (400 mg total) by mouth 2 (two) times daily. 60 tablet 11  . aspirin EC 81 MG tablet Take 81 mg by mouth daily.    Marland Kitchen  cholecalciferol (VITAMIN D) 1000 units tablet Take 1,000 Units by mouth daily.    Marland Kitchen gabapentin (NEURONTIN) 300 MG capsule Take 2 capsules (600 mg total) by mouth 3 (three) times daily. 90 capsule 6  . HYDROcodone-acetaminophen (NORCO/VICODIN) 5-325 MG tablet Take 1 tablet by mouth 3 (three) times daily as needed for moderate pain. 90 tablet 0  .  HYDROcodone-homatropine (HYCODAN) 5-1.5 MG/5ML syrup Take 5 mLs by mouth every 6 (six) hours as needed for cough. 120 mL 0  . lenalidomide (REVLIMID) 20 MG capsule Take 1 capsule (20 mg total) by mouth daily. Take for 21 days, then 7 days off 21 capsule 0  . ondansetron (ZOFRAN) 8 MG tablet Take 1 tablet (8 mg total) by mouth every 8 (eight) hours as needed (Nausea or vomiting). (Patient not taking: Reported on 03/18/2016) 30 tablet 1  . oseltamivir (TAMIFLU) 75 MG capsule Take 1 capsule (75 mg total) by mouth 2 (two) times daily. 10 capsule 0  . senna-docusate (SENOKOT-S) 8.6-50 MG tablet Take 1 tablet by mouth at bedtime as needed for mild constipation. (Patient not taking: Reported on 03/18/2016) 30 tablet 0   No current facility-administered medications for this visit.     PHYSICAL EXAMINATION: ECOG PERFORMANCE STATUS: 1 - Symptomatic but completely ambulatory  Vitals:   03/18/16 1032  BP: (!) 143/76  Pulse: 97  Resp: 18  Temp: 98.9 F (37.2 C)   Filed Weights   03/18/16 1032  Weight: 222 lb 6.4 oz (100.9 kg)    GENERAL:alert, no distress and comfortable SKIN: skin color, texture, turgor are normal, no rashes or significant lesions EYES: normal, Conjunctiva are pink and non-injected, sclera clear OROPHARYNX:no exudate, no erythema and lips, buccal mucosa, and tongue normal  NECK: supple, thyroid normal size, non-tender, without nodularity LYMPH:  no palpable lymphadenopathy in the cervical, axillary or inguinal LUNGS: clear to auscultation and percussion with normal breathing effort HEART: regular rate & rhythm and no murmurs and no lower extremity edema ABDOMEN:abdomen soft, non-tender and normal bowel sounds Musculoskeletal:no cyanosis of digits and no clubbing  NEURO: alert & oriented x 3 with fluent speech, no focal motor/sensory deficits  LABORATORY DATA:  I have reviewed the data as listed    Component Value Date/Time   NA 142 03/11/2016 1038   K 3.8 03/11/2016 1038    CL 110 07/20/2015 0755   CO2 29 03/11/2016 1038   GLUCOSE 104 03/11/2016 1038   BUN 15.4 03/11/2016 1038   CREATININE 1.1 03/11/2016 1038   CALCIUM 9.7 03/11/2016 1038   PROT 6.5 03/11/2016 1038   PROT 6.9 03/11/2016 1038   ALBUMIN 4.0 03/11/2016 1038   AST 17 03/11/2016 1038   ALT 19 03/11/2016 1038   ALKPHOS 53 03/11/2016 1038   BILITOT 0.98 03/11/2016 1038   GFRNONAA 49 (L) 07/20/2015 0755   GFRAA 56 (L) 07/20/2015 0755    No results found for: SPEP, UPEP  Lab Results  Component Value Date   WBC 3.6 (L) 03/11/2016   NEUTROABS 1.9 03/11/2016   HGB 13.6 03/11/2016   HCT 39.8 03/11/2016   MCV 89.1 03/11/2016   PLT 201 03/11/2016      Chemistry      Component Value Date/Time   NA 142 03/11/2016 1038   K 3.8 03/11/2016 1038   CL 110 07/20/2015 0755   CO2 29 03/11/2016 1038   BUN 15.4 03/11/2016 1038   CREATININE 1.1 03/11/2016 1038      Component Value Date/Time   CALCIUM 9.7 03/11/2016 1038  ALKPHOS 53 03/11/2016 1038   AST 17 03/11/2016 1038   ALT 19 03/11/2016 1038   BILITOT 0.98 03/11/2016 1038       ASSESSMENT & PLAN:  Multiple myeloma not having achieved remission [facility]) The patient recently has Medicaid approval He would need dental clearance to proceed with Zometa ; I have placed referral for him to see our medical dentist today His last set of blood work shows stable disease He tolerated chemotherapy well.  I plan to increase the dose of Revlimid to 20 mg, days 1-21, rest 7 days He will continue calcium, vitamin D, acyclovir and aspirin therapy I will refer him to [facility] for transplant evaluation  Influenza A with respiratory manifestations The patient presented with flulike illness. He was recommended droplet isolation. At the time of dictation, influenza panel came back positive for influenza A. He was prescribed prescription Tamiflu for treatment. 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 Continue the same dose for now  Spinal cord compression due to malignant neoplasm metastatic to spine [facility]) He has completed palliative radiation therapy. Clinically, he is relatively asymptomatic apart from sensation of tightness. He has some altered sensation down his legs but nothing to suggest imminent cord compression. He has no appreciable weakness in both lower extremities His recent MRI is reviewed with the patient. Continue close observation and monitoring. I plan to repeat MRI in 6 months, due May 2018   Orders Placed This Encounter  Procedures  . Influenza A and B    Standing Status:   Future    Standing Expiration Date:   03/18/2017  . Kappa/lambda light chains    Standing Status:   Future    Standing Expiration Date:   04/22/2017  . Multiple Myeloma Panel (SPEP&IFE w/QIG)    Standing Status:   Future    Standing Expiration Date:   04/22/2017  . Ambulatory referral to Dentistry    Referral Priority:   Routine    Referral Type:   Consultation    Referral Reason:   Specialty Services Required    Requested Specialty:   Dental General Practice    Number of Visits Requested:   1   All questions were answered. The patient knows to call the clinic with any problems, questions or concerns. No barriers to learning was detected. I spent 30 minutes counseling the patient face to face. The total time spent in the appointment was 40 minutes and more than 50% was on counseling and review of test results     [name], MD 03/18/2016 1:31 PM

## 2016-03-18 NOTE — Telephone Encounter (Signed)
Pt notified of results. Tamiflu to be sent to [place]

## 2016-03-18 NOTE — Assessment & Plan Note (Signed)
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 Continue the same dose for now 

## 2016-03-18 NOTE — Assessment & Plan Note (Signed)
The patient presented with flulike illness. He was recommended droplet isolation. At the time of dictation, influenza panel came back positive for influenza A. He was prescribed prescription Tamiflu for treatment. I also provided prescription of cough syrup to take as needed

## 2016-03-18 NOTE — Assessment & Plan Note (Signed)
The patient recently has Medicaid approval He would need dental clearance to proceed with Zometa ; I have placed referral for him to see our medical dentist today His last set of blood work shows stable disease He tolerated chemotherapy well.  I plan to increase the dose of Revlimid to 20 mg, days 1-21, rest 7 days He will continue calcium, vitamin D, acyclovir and aspirin therapy I will refer him to [facility] for transplant evaluation

## 2016-03-19 ENCOUNTER — Telehealth: Payer: Self-pay | Admitting: *Deleted

## 2016-03-19 ENCOUNTER — Other Ambulatory Visit: Payer: Self-pay | Admitting: *Deleted

## 2016-03-19 MED ORDER — LENALIDOMIDE 20 MG PO CAPS: 20.0000 mg | ORAL_CAPSULE | Freq: Every day | ORAL | 0 refills | Status: DC

## 2016-03-19 NOTE — Telephone Encounter (Signed)
Referral called to [facility]- Dr Norma Fredrickson for BMT for myeloma.  Appt made with Dr Norma Fredrickson on 3/21@ 0745  Pt notified

## 2016-03-20 ENCOUNTER — Ambulatory Visit (HOSPITAL_COMMUNITY): Payer: Self-pay | Admitting: Dentistry

## 2016-03-20 ENCOUNTER — Encounter (HOSPITAL_COMMUNITY): Payer: Self-pay | Admitting: Dentistry

## 2016-03-20 VITALS — BP 132/69 | HR 69 | Temp 98.3°F

## 2016-03-20 DIAGNOSIS — C9000 Multiple myeloma not having achieved remission: Secondary | ICD-10-CM

## 2016-03-20 DIAGNOSIS — Z01818 Encounter for other preprocedural examination: Secondary | ICD-10-CM

## 2016-03-20 DIAGNOSIS — K0530 Chronic periodontitis, unspecified: Secondary | ICD-10-CM

## 2016-03-20 DIAGNOSIS — M270 Developmental disorders of jaws: Secondary | ICD-10-CM

## 2016-03-20 DIAGNOSIS — K030 Excessive attrition of teeth: Secondary | ICD-10-CM

## 2016-03-20 DIAGNOSIS — C7951 Secondary malignant neoplasm of bone: Secondary | ICD-10-CM

## 2016-03-20 DIAGNOSIS — M264 Malocclusion, unspecified: Secondary | ICD-10-CM

## 2016-03-20 DIAGNOSIS — K083 Retained dental root: Secondary | ICD-10-CM

## 2016-03-20 DIAGNOSIS — K029 Dental caries, unspecified: Secondary | ICD-10-CM

## 2016-03-20 DIAGNOSIS — K06010 Localized gingival recession, unspecified: Secondary | ICD-10-CM

## 2016-03-20 DIAGNOSIS — K036 Deposits [accretions] on teeth: Secondary | ICD-10-CM

## 2016-03-20 DIAGNOSIS — K031 Abrasion of teeth: Secondary | ICD-10-CM

## 2016-03-20 DIAGNOSIS — K08409 Partial loss of teeth, unspecified cause, unspecified class: Secondary | ICD-10-CM

## 2016-03-20 DIAGNOSIS — K045 Chronic apical periodontitis: Secondary | ICD-10-CM

## 2016-03-20 NOTE — Patient Instructions (Signed)
Patient to follow-up with Dr. Benson Norway for oral surgery consultation for 04/01/2016. Patient is in the process of being referred for primary dental care with a dentist that takes the dental Medicaid insurance.  Dr. Enrique Sack

## 2016-03-20 NOTE — Progress Notes (Signed)
DENTAL CONSULTATION  Date of Consultation:  03/20/2016 Patient Name:   Shah Insley Date of Birth:   14-Oct-1970 Medical Record Number: [id number]  VITALS: BP 132/69 (BP Location: Left Arm)   Pulse 69   Temp 98.3 F (36.8 C) (Oral)   CHIEF COMPLAINT: Patient is referred by Dr. Alvy Bimler for a dental consultation.   HPI: Hassen Bruun is a 46 year old male with history of multiple myeloma and bone metastases. Patient is currently taking Revlimid maintenance therapy. Patient with anticipated use of IV Zometa. Patient is now seen as part of a pre-Zometa dental protocol examination.  Patient currently denies any acute toothaches, swellings, or abscesses-today.  Patient did have a history of upper right molar #1 and premolar #4 that were hurting him about 2-3 weeks ago. Patient describes sharp pain that lasted hours to days. Induration intensity of 6 out of 10 but is currently 0 out of 10 today. Patient has not seen a dentist "a long time".  Patient thinks was about 2007 when he had a dental cleaning. 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 Patient had a root canal on the lower left molar #18 approximately 2002. This tooth has since broken down and the root canal therapy is now exposed to the oral environment. Patient denies having any partial dentures.   PROBLEM LIST: Patient Active Problem List   Diagnosis Date Noted  . Influenza A with respiratory manifestations 03/18/2016  . Peripheral neuropathy due to chemotherapy ([place]) 12/05/2015  . Constipation, chronic 11/03/2015  . Drug-induced leukopenia ([place]) 10/10/2015  . Cancer associated pain 09/11/2015  . Localized swelling of both lower legs 09/11/2015  . Pancytopenia due to antineoplastic chemotherapy ([place]) 08/07/2015  . Elevated serum creatinine 07/28/2015  . Multiple myeloma not having achieved remission ([place]) 07/18/2015  . 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 Leukocytosis 06/24/2015  . 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 Bony metastasis ([place]) 06/24/2015    PMH: Past Medical History:  Diagnosis Date  . Bone metastases ([place])    T spine and L spine  . Multiple myeloma not having achieved remission ([place]) 06/25/15    PSH: Past Surgical History:  Procedure Laterality Date  . ANTERIOR CRUCIATE LIGAMENT REPAIR    . LAMINECTOMY N/A 06/25/2015   Procedure: Thoracic six LAMINECTOMY RESECTION FOR TUMOR;  Surgeon: Consuella Lose, MD;  Location: [place] [facility];  Service: Neurosurgery;  Laterality: N/A;    ALLERGIES: No Known Allergies  MEDICATIONS: Current Outpatient Prescriptions  Medication Sig Dispense Refill  . acyclovir (ZOVIRAX) 400 MG tablet Take 1 tablet (400 mg total) by mouth 2 (two) times daily. 60 tablet 11  . aspirin EC 81 MG tablet Take 81 mg by mouth daily.    . cholecalciferol (VITAMIN D) 1000 units tablet Take 1,000 Units by mouth daily.    Marland Kitchen gabapentin (NEURONTIN) 300 MG capsule Take 2 capsules (600 mg total) by mouth 3 (three) times daily. 90 capsule 6  . HYDROcodone-acetaminophen (NORCO/VICODIN) 5-325 MG tablet Take 1 tablet by mouth 3 (three) times daily as needed for moderate pain. 90 tablet 0  . HYDROcodone-homatropine (HYCODAN) 5-1.5 MG/5ML syrup Take 5 mLs by mouth every 6 (six) hours as needed for cough. 120 mL 0  . lenalidomide (REVLIMID) 20 MG capsule Take 1 capsule (20 mg total) by mouth daily. Take for 21 days, then 7 days off 21 capsule  0  . ondansetron (ZOFRAN) 8 MG tablet Take 1 tablet (8 mg total) by mouth every 8 (eight) hours as needed (Nausea or vomiting). (Patient not taking: Reported on 03/18/2016) 30 tablet 1  . oseltamivir (TAMIFLU) 75 MG capsule Take 1 capsule (75 mg total) by mouth 2 (two) times daily. 10 capsule 0  . senna-docusate (SENOKOT-S) 8.6-50 MG tablet Take 1 tablet by mouth at bedtime as needed for mild  constipation. (Patient not taking: Reported on 03/18/2016) 30 tablet 0   No current facility-administered medications for this visit.     LABS: Lab Results  Component Value Date   WBC 3.6 (L) 03/11/2016   HGB 13.6 03/11/2016   HCT 39.8 03/11/2016   MCV 89.1 03/11/2016   PLT 201 03/11/2016      Component Value Date/Time   NA 142 03/11/2016 1038   K 3.8 03/11/2016 1038   CL 110 07/20/2015 0755   CO2 29 03/11/2016 1038   GLUCOSE 104 03/11/2016 1038   BUN 15.4 03/11/2016 1038   CREATININE 1.1 03/11/2016 1038   CALCIUM 9.7 03/11/2016 1038   GFRNONAA 49 (L) 07/20/2015 0755   GFRAA 56 (L) 07/20/2015 0755   Lab Results  Component Value Date   INR 1.14 06/24/2015   No results found for: PTT  SOCIAL HISTORY: Social History   Social History  . Marital status: Single    Spouse name: N/A  . Number of children: N/A  . Years of education: N/A   Occupational History  . Not on file.   Social History Main Topics  . Smoking status: Former Research scientist (life sciences)  . Smokeless tobacco: Never Used  . Alcohol use No  . Drug use: No  . Sexual activity: Yes     Comment: friend Amadou next of kin. 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 Not on file   Social History Narrative  . No narrative on file    FAMILY HISTORY: Family History  Problem Relation Age of Onset  . Cancer Neg Hx     REVIEW OF SYSTEMS: Reviewed With the patient has per history of present illness.  Psych: The patient Has dental phobia.  DENTAL HISTORY: CHIEF COMPLAINT: Patient is referred by Dr. Alvy Bimler for a dental consultation.   HPI: Westly Hinnant is a 46 year old male with history of multiple myeloma and bone metastases. Patient is currently taking Revlimid maintenance therapy. Patient with anticipated use of IV Zometa. Patient is now seen as part of a pre-Zometa dental protocol examination.  Patient currently denies any acute toothaches, swellings, or abscesses-today.  Patient did have a history  of upper right molar #1 and premolar #4 that were hurting him about 2-3 weeks ago. Patient describes sharp pain that lasted hours to days. Induration intensity of 6 out of 10 but is currently 0 out of 10 today. Patient has not seen a dentist "a long time".  Patient thinks was about 2007 when he had a dental cleaning. 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 Patient had a root canal on the lower left molar #18 approximately 2002. This tooth has since broken down and the root canal therapy is now exposed to the oral environment. Patient denies having any partial dentures.   DENTAL EXAMINATION: GENERAL: Patient is a well-developed, well-nourished male in no acute distress.  HEAD AND NECK:There is no neck lymphadenopathy. The patient denies acute TMJ symptoms.  INTRAORAL EXAM: The patient has  normal saliva. The patient has a mid palatal torus. There is no evidence of oral abscess formation. Patient has evidence of bilateral cheek biting.  DENTITION: Patient is missing tooth numbers 2, 19, and 30. Tooth #18 is present as retained root segments. PERIODONTAL: The patient has chronic periodontitis with plaque and calculus accumulations, gingival recession, and incipient to moderate bone loss. DENTAL CARIES/SUBOPTIMAL RESTORATIONS: The patient has multiple dental caries noted . Extensive caries into the pulp is noted on tooth numbers 1 and 4.  ENDODONTIC:Patient with a history of acute pulpitis symptoms. Patient has periapical pathology and radiolucency associated with tooth numbers 1 and possibly #4. The patient has had a previous root canal therapy associated with tooth #18 and that is now exposed to the oral environment. The patient has multiple radiolucencies associated with the mandibular anterior teeth and premolars. 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  CROWN AND  BRIDGE: No crown or bridge restorations.  PROSTHODONTIC:No history of partial dentures.  OCCLUSION:Patient has a poor occlusal scheme secondary to multiple missing teeth, retained root segments, and lack replacement of all missing teeth with dental prostheses.   RADIOGRAPHIC INTERPRETATION: Orthopantogram was taken and supplemented with a full series of dental radiographs. Patient has missing tooth numbers 2, 19, and 30. There are retained root segments in the area tooth #18. There is a previous root canal therapy associated with the roots of tooth #18. There is a periapical radiolucency associated with the apex of tooth #1. There is incipient to moderate bone loss noted. Radiographic calculus is noted. 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  ASSESSMENTS: 1. Multiple Myeloma with bone metastases 2. Pre-Zometa dental protocol examination 3. Chronic apical periodontitis 4. Retained root segment 5. Dental caries 6. Chronic periodontitis of bone loss 7. Gingival recession 8. Accretions 9. Multiple missing teeth 10. Poor occlusal scheme and malocclusion 11. 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 I discussed the risks, benefits, and complications of various treatment options with the patient in relationship to his medical and dental conditions, anticipated Zometa therapy, and risk for drug-induced osteonecrosis of the jaw in the future.  We discussed various treatment options to include no treatment, multiple extractions with alveoloplasty, pre-prosthetic surgery as indicated, periodontal therapy, dental restorations, root canal therapy, crown and bridge therapy, implant therapy, and replacement of missing teeth as indicated.  We also discussed referral to an oral surgeon for the dental  extractions, referral to an endodontist for root canal therapy, and referral to a new primary dentist for the periodontal therapy and dental restorations and continued follow-up. The patient currently wishes to proceed with referral to Dr. Frederik Schmidt for oral surgical treatment. Patient is scheduled for consultation for 04/01/2016 to discuss extraction of tooth numbers 1, 4, 16, 17, 18, and 32. The patient also agrees to be referred to new primary dentist for initial periodontal therapy and dental restorations and continued general care needs. Referral is currently being coordinated. Patient refused referral for root canal therapy with an endodontist at this time. Patient hopefully will be able to start Zometa therapy approximately 4-6 weeks after the dental extraction procedures.   2. Discussion of findings with medical team and coordination of future medical and dental care as needed.  I spent in excess of  120 minutes during the conduct of this consultation and >50% of this time involved direct  face-to-face encounter for counseling and/or coordination of the patient's care.    Lenn Cal, DDS

## 2016-03-21 DIAGNOSIS — K036 Deposits [accretions] on teeth: Secondary | ICD-10-CM

## 2016-03-21 DIAGNOSIS — K029 Dental caries, unspecified: Secondary | ICD-10-CM

## 2016-03-21 DIAGNOSIS — K08409 Partial loss of teeth, unspecified cause, unspecified class: Secondary | ICD-10-CM

## 2016-03-21 DIAGNOSIS — K0530 Chronic periodontitis, unspecified: Secondary | ICD-10-CM | POA: Diagnosis not present

## 2016-03-21 DIAGNOSIS — K083 Retained dental root: Secondary | ICD-10-CM | POA: Diagnosis not present

## 2016-03-21 DIAGNOSIS — K045 Chronic apical periodontitis: Secondary | ICD-10-CM

## 2016-03-21 DIAGNOSIS — M264 Malocclusion, unspecified: Secondary | ICD-10-CM

## 2016-04-11 ENCOUNTER — Other Ambulatory Visit (HOSPITAL_BASED_OUTPATIENT_CLINIC_OR_DEPARTMENT_OTHER): Payer: Medicaid Other

## 2016-04-11 DIAGNOSIS — C9000 Multiple myeloma not having achieved remission: Secondary | ICD-10-CM | POA: Diagnosis not present

## 2016-04-11 LAB — COMPREHENSIVE METABOLIC PANEL
ALT: 17 U/L (ref 0–55)
AST: 14 U/L (ref 5–34)
Albumin: 3.9 g/dL (ref 3.5–5.0)
Alkaline Phosphatase: 53 U/L (ref 40–150)
Anion Gap: 7 mEq/L (ref 3–11)
BILIRUBIN TOTAL: 0.67 mg/dL (ref 0.20–1.20)
BUN: 13.7 mg/dL (ref 7.0–26.0)
CO2: 28 meq/L (ref 22–29)
Calcium: 9.6 mg/dL (ref 8.4–10.4)
Chloride: 107 mEq/L (ref 98–109)
Creatinine: 1 mg/dL (ref 0.7–1.3)
EGFR: 85 mL/min/{1.73_m2} — AB (ref >90)
GLUCOSE: 70 mg/dL (ref 70–140)
Potassium: 3.8 mEq/L (ref 3.5–5.1)
SODIUM: 141 meq/L (ref 136–145)
TOTAL PROTEIN: 6.8 g/dL (ref 6.4–8.3)

## 2016-04-11 LAB — CBC WITH DIFFERENTIAL/PLATELET
BASO%: 2 % (ref 0.0–2.0)
Basophils Absolute: 0.1 10*3/uL (ref 0.0–0.1)
EOS%: 8.5 % — AB (ref 0.0–7.0)
Eosinophils Absolute: 0.3 10*3/uL (ref 0.0–0.5)
HCT: 40.4 % (ref 38.4–49.9)
HGB: 13.6 g/dL (ref 13.0–17.1)
LYMPH%: 29.3 % (ref 14.0–49.0)
MCH: 30 pg (ref 27.2–33.4)
MCHC: 33.6 g/dL (ref 32.0–36.0)
MCV: 89.4 fL (ref 79.3–98.0)
MONO#: 0.3 10*3/uL (ref 0.1–0.9)
MONO%: 10.6 % (ref 0.0–14.0)
NEUT%: 49.6 % (ref 39.0–75.0)
NEUTROS ABS: 1.6 10*3/uL (ref 1.5–6.5)
Platelets: 186 10*3/uL (ref 140–400)
RBC: 4.52 10*6/uL (ref 4.20–5.82)
RDW: 14.4 % (ref 11.0–14.6)
WBC: 3.3 10*3/uL — ABNORMAL LOW (ref 4.0–10.3)
lymph#: 1 10*3/uL (ref 0.9–3.3)

## 2016-04-12 LAB — KAPPA/LAMBDA LIGHT CHAINS
IG LAMBDA FREE LIGHT CHAIN: 105.6 mg/L — AB (ref 5.7–26.3)
Ig Kappa Free Light Chain: 15.5 mg/L (ref 3.3–19.4)
KAPPA/LAMBDA FLC RATIO: 0.15 — AB (ref 0.26–1.65)

## 2016-04-15 LAB — MULTIPLE MYELOMA PANEL, SERUM
ALBUMIN SERPL ELPH-MCNC: 3.7 g/dL (ref 2.9–4.4)
ALPHA 1: 0.2 g/dL (ref 0.0–0.4)
ALPHA2 GLOB SERPL ELPH-MCNC: 0.7 g/dL (ref 0.4–1.0)
Albumin/Glob SerPl: 1.4 (ref 0.7–1.7)
B-GLOBULIN SERPL ELPH-MCNC: 0.9 g/dL (ref 0.7–1.3)
GAMMA GLOB SERPL ELPH-MCNC: 1 g/dL (ref 0.4–1.8)
GLOBULIN, TOTAL: 2.8 g/dL (ref 2.2–3.9)
IGG (IMMUNOGLOBIN G), SERUM: 985 mg/dL (ref 700–1600)
IgA, Qn, Serum: 80 mg/dL — ABNORMAL LOW (ref 90–386)
IgM, Qn, Serum: 35 mg/dL (ref 20–172)
TOTAL PROTEIN: 6.5 g/dL (ref 6.0–8.5)

## 2016-04-18 ENCOUNTER — Ambulatory Visit (HOSPITAL_BASED_OUTPATIENT_CLINIC_OR_DEPARTMENT_OTHER): Payer: Medicaid Other | Admitting: Hematology and Oncology

## 2016-04-18 ENCOUNTER — Telehealth: Payer: Self-pay | Admitting: Hematology and Oncology

## 2016-04-18 ENCOUNTER — Encounter: Payer: Self-pay | Admitting: Hematology and Oncology

## 2016-04-18 DIAGNOSIS — C9000 Multiple myeloma not having achieved remission: Secondary | ICD-10-CM | POA: Diagnosis present

## 2016-04-18 DIAGNOSIS — G620 Drug-induced polyneuropathy: Secondary | ICD-10-CM

## 2016-04-18 DIAGNOSIS — D702 Other drug-induced agranulocytosis: Secondary | ICD-10-CM | POA: Diagnosis not present

## 2016-04-18 DIAGNOSIS — T451X5A Adverse effect of antineoplastic and immunosuppressive drugs, initial encounter: Secondary | ICD-10-CM

## 2016-04-18 DIAGNOSIS — G893 Neoplasm related pain (acute) (chronic): Secondary | ICD-10-CM | POA: Diagnosis not present

## 2016-04-18 MED ORDER — CALCIUM CARBONATE ANTACID 500 MG PO CHEW: 1.0000 | CHEWABLE_TABLET | Freq: Two times a day (BID) | ORAL | 9 refills | Status: DC

## 2016-04-18 NOTE — Telephone Encounter (Signed)
Gave patient avs report and appointments for March. Per 3/22 los no rtn visit.

## 2016-04-18 NOTE — Assessment & Plan Note (Signed)
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 Continue the same dose for now

## 2016-04-18 NOTE — Assessment & Plan Note (Signed)
This is likely due to recent treatment. The patient denies recent history of fevers, cough, chills, diarrhea or dysuria. He is asymptomatic from the leukopenia. I will observe for now.  I will continue the chemotherapy at current dose without dosage adjustment.  If the leukopenia gets progressive worse in the future, I might have to delay his treatment or adjust the chemotherapy dose. 

## 2016-04-18 NOTE — Progress Notes (Signed)
[facility] OFFICE PROGRESS NOTE  Patient Care Team: Rometta [name], MD as PCP - General (Internal Medicine)  SUMMARY OF ONCOLOGIC HISTORY:   Multiple myeloma not having achieved remission (HCC)   06/23/2015 - 06/28/2015 Hospital Admission    The patient was admitted to the hospital due to gait ataxia and back pain. He was subsequently found to have cord compression underwent surgery and was discharged home      06/24/2015 Imaging    Abnormal appearance of the T6 vertebral body, highly suspicious for possible osseous metastasis. Associated pathologic fracture withup to 30% height loss. There is associated abnormal soft tissue density within the ventral epidural space,      06/24/2015 Imaging    MRI lumbar: Focal osseous lesion with abnormal enhancement involving the right pedicle of L3, suspicious for possible osseous metastasisgiven the findings in the thoracic spine. Question additional focal lesion within the right iliac wing as above.        06/25/2015 Pathology Results    Accession: [id number] bone biopsy come from plasma cell neoplasm.      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 Zometa is not given due to inability to get dental clearance      12/14/2015 -  Chemotherapy    He is started on maintenance treatment with Revlimid only      12/18/2015 Imaging    MRI thoracic and lumbar spine showed numerous enhancing foci throughout the thoracic and lumbar spine with several new small foci in the lumbar spine in comparison with prior MRI compatible with metastatic disease. Stable loss of height of the T3, T4, and T6 vertebral bodies and new postsurgical changes related  to T6 laminectomy. No significant epidural disease or evidence for cord compression. No abnormal enhancement of the spinal cord or cauda equina.       INTERVAL HISTORY: Please see below for problem oriented charting. He returns for further follow-up. He denies major pain He takes pain medicine regularly.  He denies worsening neuropathy. He has fully recovered from recent influenza infection.  No recent fever or chills. He had recent dental extraction 2 weeks ago.  His jaws are healing well. He saw transplant physician yesterday at [facility] with plan to proceed with bone marrow transplant in the near future  REVIEW OF SYSTEMS:   Constitutional: Denies fevers, chills or abnormal weight loss Eyes: Denies blurriness of vision Ears, nose, mouth, throat, and face: Denies mucositis or sore throat Respiratory: Denies cough, dyspnea or wheezes Cardiovascular: Denies palpitation, chest discomfort or lower extremity swelling Gastrointestinal:  Denies nausea, heartburn or change in bowel habits Skin: Denies abnormal skin rashes Lymphatics: Denies new lymphadenopathy or easy bruising Neurological:Denies numbness, tingling or new weaknesses Behavioral/Psych: Mood is stable, no new changes  All other systems were reviewed with the patient and are negative.  I have reviewed the past medical history, past surgical history, social history and family history with the patient and they are unchanged from previous note.  ALLERGIES:  has No Known Allergies.  MEDICATIONS:  Current Outpatient Prescriptions  Medication Sig Dispense Refill  . acyclovir (ZOVIRAX) 400 MG tablet Take 1 tablet (400 mg total)  by mouth 2 (two) times daily. 60 tablet 11  . aspirin EC 81 MG tablet Take 81 mg by mouth daily.    . calcium carbonate (TUMS) 500 MG chewable tablet Chew 1 tablet (200 mg of elemental calcium total) by mouth 2 (two) times daily. 60 tablet 9  . cholecalciferol (VITAMIN D) 1000  units tablet Take 1,000 Units by mouth daily.    Marland Kitchen gabapentin (NEURONTIN) 300 MG capsule Take 2 capsules (600 mg total) by mouth 3 (three) times daily. (Patient not taking: Reported on 03/20/2016) 90 capsule 6  . HYDROcodone-acetaminophen (NORCO/VICODIN) 5-325 MG tablet Take 1 tablet by mouth 3 (three) times daily as needed for moderate pain. 90 tablet 0  . HYDROcodone-homatropine (HYCODAN) 5-1.5 MG/5ML syrup Take 5 mLs by mouth every 6 (six) hours as needed for cough. (Patient not taking: Reported on 03/20/2016) 120 mL 0  . lenalidomide (REVLIMID) 20 MG capsule Take 1 capsule (20 mg total) by mouth daily. Take for 21 days, then 7 days off 21 capsule 0  . ondansetron (ZOFRAN) 8 MG tablet Take 1 tablet (8 mg total) by mouth every 8 (eight) hours as needed (Nausea or vomiting). (Patient not taking: Reported on 03/18/2016) 30 tablet 1  . senna-docusate (SENOKOT-S) 8.6-50 MG tablet Take 1 tablet by mouth at bedtime as needed for mild constipation. (Patient not taking: Reported on 03/18/2016) 30 tablet 0   No current facility-administered medications for this visit.     PHYSICAL EXAMINATION: ECOG PERFORMANCE STATUS: 1 - Symptomatic but completely ambulatory  Vitals:   04/18/16 1135  BP: 121/76  Pulse: 71  Resp: 18  Temp: 98.4 F (36.9 C)   Filed Weights   04/18/16 1135  Weight: 221 lb 8 oz (100.5 kg)    GENERAL:alert, no distress and comfortable SKIN: skin color, texture, turgor are normal, no rashes or significant lesions EYES: normal, Conjunctiva are pink and non-injected, sclera clear OROPHARYNX:no exudate, no erythema and lips, buccal mucosa, and tongue normal  NECK: supple, thyroid normal size, non-tender, without nodularity LYMPH:  no palpable lymphadenopathy in the cervical, axillary or inguinal LUNGS: clear to auscultation and percussion with normal breathing effort HEART: regular rate & rhythm and no murmurs and no lower extremity edema ABDOMEN:abdomen soft, non-tender and normal  bowel sounds Musculoskeletal:no cyanosis of digits and no clubbing  NEURO: alert & oriented x 3 with fluent speech, no focal motor/sensory deficits  LABORATORY DATA:  I have reviewed the data as listed    Component Value Date/Time   NA 141 04/11/2016 1104   K 3.8 04/11/2016 1104   CL 110 07/20/2015 0755   CO2 28 04/11/2016 1104   GLUCOSE 70 04/11/2016 1104   BUN 13.7 04/11/2016 1104   CREATININE 1.0 04/11/2016 1104   CALCIUM 9.6 04/11/2016 1104   PROT 6.8 04/11/2016 1104   PROT 6.5 04/11/2016 1104   ALBUMIN 3.9 04/11/2016 1104   AST 14 04/11/2016 1104   ALT 17 04/11/2016 1104   ALKPHOS 53 04/11/2016 1104   BILITOT 0.67 04/11/2016 1104   GFRNONAA 49 (L) 07/20/2015 0755   GFRAA 56 (L) 07/20/2015 0755    No results found for: SPEP, UPEP  Lab Results  Component Value Date   WBC 3.3 (L) 04/11/2016   NEUTROABS 1.6 04/11/2016   HGB 13.6 04/11/2016   HCT 40.4 04/11/2016   MCV 89.4 04/11/2016   PLT 186 04/11/2016      Chemistry      Component Value Date/Time   NA 141 04/11/2016 1104  K 3.8 04/11/2016 1104   CL 110 07/20/2015 0755   CO2 28 04/11/2016 1104   BUN 13.7 04/11/2016 1104   CREATININE 1.0 04/11/2016 1104      Component Value Date/Time   CALCIUM 9.6 04/11/2016 1104   ALKPHOS 53 04/11/2016 1104   AST 14 04/11/2016 1104   ALT 17 04/11/2016 1104   BILITOT 0.67 04/11/2016 1104       ASSESSMENT & PLAN:  Multiple myeloma not having achieved remission ([place]) I reviewed recent myeloma panel with him. His blood work show PGPR He has seen transplant team at [facility] with plan for bone marrow transplant soon. He had recent dental extraction and it appears to be healing well. I will start him on Zometa next week. I recommend vitamin D and calcium supplement. I plan to see him after his transplant is complete  The patient is aware that he will need maintenance treatment with Revlimid in the future.   Drug-induced leukopenia  ([place]) This is likely due to recent treatment. The patient denies recent history of fevers, cough, chills, diarrhea or dysuria. He is asymptomatic from the leukopenia. I will observe for now.  I will continue the chemotherapy at current dose without dosage adjustment.  If the leukopenia gets progressive worse in the future, I might have to delay his treatment or adjust the chemotherapy dose.    Cancer associated pain This is stable He will continue pain medication as prescribed  Peripheral neuropathy due to chemotherapy [facility]) He has severe peripheral neuropathy from Velcade The dose of gabapentin is increased and he complained of mild sedation. Continue the same dose for now   No orders of the defined types were placed in this encounter.  All questions were answered. The patient knows to call the clinic with any problems, questions or concerns. No barriers to learning was detected. I spent 25 minutes counseling the patient face to face. The total time spent in the appointment was 30 minutes and more than 50% was on counseling and review of test results     [name], MD 04/18/2016 1:43 PM

## 2016-04-18 NOTE — Assessment & Plan Note (Signed)
This is stable He will continue pain medication as prescribed

## 2016-04-18 NOTE — Assessment & Plan Note (Signed)
I reviewed recent myeloma panel with him. His blood work show PGPR He has seen transplant team at [facility] with plan for bone marrow transplant soon. He had recent dental extraction and it appears to be healing well. I will start him on Zometa next week. I recommend vitamin D and calcium supplement. I plan to see him after his transplant is complete  The patient is aware that he will need maintenance treatment with Revlimid in the future.

## 2016-04-26 ENCOUNTER — Ambulatory Visit (HOSPITAL_BASED_OUTPATIENT_CLINIC_OR_DEPARTMENT_OTHER): Payer: Medicaid Other

## 2016-04-26 VITALS — BP 128/78 | HR 69 | Temp 98.7°F | Resp 18

## 2016-04-26 DIAGNOSIS — C9000 Multiple myeloma not having achieved remission: Secondary | ICD-10-CM

## 2016-04-26 MED ORDER — SODIUM CHLORIDE 0.9 % IV SOLN
Freq: Once | INTRAVENOUS | Status: AC
  Administered 2016-04-26: 10:00:00 via INTRAVENOUS

## 2016-04-26 MED ORDER — ZOLEDRONIC ACID 4 MG/100ML IV SOLN
4.0000 mg | Freq: Once | INTRAVENOUS | Status: AC
  Administered 2016-04-26: 4 mg via INTRAVENOUS
  Filled 2016-04-26: qty 100

## 2016-04-26 MED FILL — ACYCLOVIR 400 MG TABLET: 400 | 30 days supply | Qty: 60 | Fill #1

## 2016-04-26 NOTE — Patient Instructions (Signed)

## 2016-04-29 ENCOUNTER — Telehealth: Payer: Self-pay

## 2016-04-29 NOTE — Telephone Encounter (Signed)
Patient left message. Called him back. He states that he received Zometa for the first time Friday. Starting Friday night he started having a fever, he did not check to see what it was, a headache, stomach pain, muscle tightness and pain, and fatigue. Fever was gone Saturday morning. But today he is still having a headache, muscle tightness with  pain, and fatigue. Headache gets better with Advil, then it comes back.

## 2016-04-29 NOTE — Telephone Encounter (Signed)
Given below message, verbalized understanding. 

## 2016-04-29 NOTE — Telephone Encounter (Signed)
I recommend Advil 400 mg TID PO with food for 2 days and claritin 10 mg daily for 3-5 days

## 2016-05-02 ENCOUNTER — Telehealth: Payer: Self-pay

## 2016-05-02 NOTE — Telephone Encounter (Signed)
Wells Guiles at [facility] called to say patient was seen today. They will proceed and patient will not need any further therapy before transplant.

## 2016-05-08 ENCOUNTER — Telehealth: Payer: Self-pay | Admitting: *Deleted

## 2016-05-08 NOTE — Telephone Encounter (Signed)
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 Dr Norma Fredrickson wants him to have more treatment before proceeding with BMT. Will call back when he gives guidelines.

## 2016-05-09 ENCOUNTER — Telehealth: Payer: Self-pay | Admitting: Hematology and Oncology

## 2016-05-09 ENCOUNTER — Other Ambulatory Visit: Payer: Self-pay | Admitting: Hematology and Oncology

## 2016-05-09 DIAGNOSIS — C9000 Multiple myeloma not having achieved remission: Secondary | ICD-10-CM

## 2016-05-09 NOTE — Telephone Encounter (Signed)
Called patient to inform him of next scheduled appointments. Also mailed out new schedule.

## 2016-05-09 NOTE — Progress Notes (Signed)
START ON PATHWAY REGIMEN - Multiple Myeloma     A cycle is every 28 days:     Dexamethasone      Cyclophosphamide      Carfilzomib      Carfilzomib      Carfilzomib   **Always confirm dose/schedule in your pharmacy ordering system**    Patient Characteristics: Relapsed / Refractory, All Lines of Therapy R-ISS Staging: III Disease Classification: Relapsed Line of Therapy: Second Line  Intent of Therapy: Non-Curative / Palliative Intent, Discussed with Patient

## 2016-05-10 ENCOUNTER — Encounter: Payer: Self-pay | Admitting: Hematology and Oncology

## 2016-05-10 ENCOUNTER — Ambulatory Visit (HOSPITAL_BASED_OUTPATIENT_CLINIC_OR_DEPARTMENT_OTHER): Payer: Medicaid Other | Admitting: Hematology and Oncology

## 2016-05-10 ENCOUNTER — Telehealth: Payer: Self-pay | Admitting: Hematology and Oncology

## 2016-05-10 VITALS — BP 140/69 | HR 89 | Temp 98.5°F | Resp 18 | Ht 75.0 in | Wt 220.7 lb

## 2016-05-10 DIAGNOSIS — C9000 Multiple myeloma not having achieved remission: Secondary | ICD-10-CM | POA: Diagnosis not present

## 2016-05-10 DIAGNOSIS — Z7189 Other specified counseling: Secondary | ICD-10-CM

## 2016-05-10 DIAGNOSIS — G620 Drug-induced polyneuropathy: Secondary | ICD-10-CM

## 2016-05-10 DIAGNOSIS — T451X5A Adverse effect of antineoplastic and immunosuppressive drugs, initial encounter: Secondary | ICD-10-CM

## 2016-05-10 DIAGNOSIS — G893 Neoplasm related pain (acute) (chronic): Secondary | ICD-10-CM

## 2016-05-10 MED ORDER — ONDANSETRON HCL 8 MG PO TABS: 8.0000 mg | ORAL_TABLET | Freq: Three times a day (TID) | ORAL | 1 refills | Status: DC | PRN

## 2016-05-10 MED ORDER — HYDROCODONE-ACETAMINOPHEN 5-325 MG PO TABS: 1.0000 | ORAL_TABLET | Freq: Three times a day (TID) | ORAL | 0 refills | Status: DC | PRN

## 2016-05-10 MED ORDER — PROCHLORPERAZINE MALEATE 10 MG PO TABS: 10.0000 mg | ORAL_TABLET | Freq: Four times a day (QID) | ORAL | 1 refills | Status: DC | PRN

## 2016-05-10 MED ORDER — LIDOCAINE-PRILOCAINE 2.5-2.5 % EX CREA: TOPICAL_CREAM | CUTANEOUS | 3 refills | Status: DC

## 2016-05-10 MED FILL — PROCHLORPERAZINE 10 MG TAB: 10 | 7 days supply | Qty: 30 | Fill #0

## 2016-05-10 MED FILL — LIDOCAINE-PRILOCAINE CREAM: 2.5-2.5 | 10 days supply | Qty: 30 | Fill #0

## 2016-05-10 MED FILL — HYDROCODON-APAP 5-325: 5-325 | 30 days supply | Qty: 90 | Fill #0

## 2016-05-10 MED FILL — ONDANSETRON HCL 8 MG TABLET: 8 | 10 days supply | Qty: 30 | Fill #0

## 2016-05-10 NOTE — Patient Instructions (Signed)
Carfilzomib injection What is this medicine? CARFILZOMIB (kar FILZ oh mib) targets a specific protein within cancer cells and stops the cancer cells from growing. It is used to treat multiple myeloma. This medicine may be used for other purposes; ask your health care provider or pharmacist if you have questions. COMMON BRAND NAME(S): KYPROLIS What should I tell my health care provider before I take this medicine? They need to know if you have any of these conditions: -heart disease -history of blood clots -irregular heartbeat -kidney disease -liver disease -lung or breathing disease -an unusual or allergic reaction to carfilzomib, or other medicines, foods, dyes, or preservatives -pregnant or trying to get pregnant -breast-feeding How should I use this medicine? This medicine is for injection or infusion into a vein. It is given by a health care professional in a hospital or clinic setting. Talk to your pediatrician regarding the use of this medicine in children. Special care may be needed. Overdosage: If you think you have taken too much of this medicine contact a poison control center or emergency room at once. NOTE: This medicine is only for you. Do not share this medicine with others. What if I miss a dose? It is important not to miss your dose. Call your doctor or health care professional if you are unable to keep an appointment. What may interact with this medicine? Interactions are not expected. Give your health care provider a list of all the medicines, herbs, non-prescription drugs, or dietary supplements you use. Also tell them if you smoke, drink alcohol, or use illegal drugs. Some items may interact with your medicine. This list may not describe all possible interactions. Give your health care provider a list of all the medicines, herbs, non-prescription drugs, or dietary supplements you use. Also tell them if you smoke, drink alcohol, or use illegal drugs. Some items may  interact with your medicine. What should I watch for while using this medicine? Your condition will be monitored carefully while you are receiving this medicine. Report any side effects. Continue your course of treatment even though you feel ill unless your doctor tells you to stop. You may need blood work done while you are taking this medicine. Do not become pregnant while taking this medicine or for at least 30 days after stopping it. Women should inform their doctor if they wish to become pregnant or think they might be pregnant. There is a potential for serious side effects to an unborn child. Men should not father a child while taking this medicine and for 90 days after stopping it. Talk to your health care professional or pharmacist for more information. Do not breast-feed an infant while taking this medicine. Check with your doctor or health care professional if you get an attack of severe diarrhea, nausea and vomiting, or if you sweat a lot. The loss of too much body fluid can make it dangerous for you to take this medicine. You may get dizzy. Do not drive, use machinery, or do anything that needs mental alertness until you know how this medicine affects you. Do not stand or sit up quickly, especially if you are an older patient. This reduces the risk of dizzy or fainting spells. What side effects may I notice from receiving this medicine? Side effects that you should report to your doctor or health care professional as soon as possible: -allergic reactions like skin rash, itching or hives, swelling of the face, lips, or tongue -confusion -dizziness -feeling faint or lightheaded -fever or chills -  palpitations -seizures -signs and symptoms of bleeding such as bloody or black, tarry stools; red or dark-brown urine; spitting up blood or brown material that looks like coffee grounds; red spots on the skin; unusual bruising or bleeding including from the eye, gums, or nose -signs and symptoms of  a blood clot such as breathing problems; changes in vision; chest pain; severe, sudden headache; pain, swelling, warmth in the leg; trouble speaking; sudden numbness or weakness of the face, arm or leg -signs and symptoms of kidney injury like trouble passing urine or change in the amount of urine -signs and symptoms of liver injury like dark yellow or brown urine; general ill feeling or flu-like symptoms; light-colored stools; loss of appetite; nausea; right upper belly pain; unusually weak or tired; yellowing of the eyes or skin Side effects that usually do not require medical attention (report to your doctor or health care professional if they continue or are bothersome): -back pain -cough -diarrhea -headache -muscle cramps -vomiting This list may not describe all possible side effects. Call your doctor for medical advice about side effects. You may report side effects to FDA at 1-800-FDA-1088. Where should I keep my medicine? This drug is given in a hospital or clinic and will not be stored at home. NOTE: This sheet is a summary. It may not cover all possible information. If you have questions about this medicine, talk to your doctor, pharmacist, or health care provider.  2018 Elsevier/Gold Standard (2015-02-16 13:39:23)  Cyclophosphamide injection What is this medicine? CYCLOPHOSPHAMIDE (sye kloe FOSS fa mide) is a chemotherapy drug. It slows the growth of cancer cells. This medicine is used to treat many types of cancer like lymphoma, myeloma, leukemia, breast cancer, and ovarian cancer, to name a few. This medicine may be used for other purposes; ask your health care provider or pharmacist if you have questions. COMMON BRAND NAME(S): Cytoxan, Neosar What should I tell my health care provider before I take this medicine? They need to know if you have any of these conditions: -blood disorders -history of other chemotherapy -infection -kidney disease -liver disease -recent or ongoing  radiation therapy -tumors in the bone marrow -an unusual or allergic reaction to cyclophosphamide, other chemotherapy, other medicines, foods, dyes, or preservatives -pregnant or trying to get pregnant -breast-feeding How should I use this medicine? This drug is usually given as an injection into a vein or muscle or by infusion into a vein. It is administered in a hospital or clinic by a specially trained health care professional. Talk to your pediatrician regarding the use of this medicine in children. Special care may be needed. Overdosage: If you think you have taken too much of this medicine contact a poison control center or emergency room at once. NOTE: This medicine is only for you. Do not share this medicine with others. What if I miss a dose? It is important not to miss your dose. Call your doctor or health care professional if you are unable to keep an appointment. What may interact with this medicine? This medicine may interact with the following medications: -amiodarone -amphotericin B -azathioprine -certain antiviral medicines for HIV or AIDS such as protease inhibitors (e.g., indinavir, ritonavir) and zidovudine -certain blood pressure medications such as benazepril, captopril, enalapril, fosinopril, lisinopril, moexipril, monopril, perindopril, quinapril, ramipril, trandolapril -certain cancer medications such as anthracyclines (e.g., daunorubicin, doxorubicin), busulfan, cytarabine, paclitaxel, pentostatin, tamoxifen, trastuzumab -certain diuretics such as chlorothiazide, chlorthalidone, hydrochlorothiazide, indapamide, metolazone -certain medicines that treat or prevent blood clots like warfarin -certain   muscle relaxants such as succinylcholine -cyclosporine -etanercept -indomethacin -medicines to increase blood counts like filgrastim, pegfilgrastim, sargramostim -medicines used as general anesthesia -metronidazole -natalizumab This list may not describe all possible  interactions. Give your health care provider a list of all the medicines, herbs, non-prescription drugs, or dietary supplements you use. Also tell them if you smoke, drink alcohol, or use illegal drugs. Some items may interact with your medicine. What should I watch for while using this medicine? Visit your doctor for checks on your progress. This drug may make you feel generally unwell. This is not uncommon, as chemotherapy can affect healthy cells as well as cancer cells. Report any side effects. Continue your course of treatment even though you feel ill unless your doctor tells you to stop. Drink water or other fluids as directed. Urinate often, even at night. In some cases, you may be given additional medicines to help with side effects. Follow all directions for their use. Call your doctor or health care professional for advice if you get a fever, chills or sore throat, or other symptoms of a cold or flu. Do not treat yourself. This drug decreases your body's ability to fight infections. Try to avoid being around people who are sick. This medicine may increase your risk to bruise or bleed. Call your doctor or health care professional if you notice any unusual bleeding. Be careful brushing and flossing your teeth or using a toothpick because you may get an infection or bleed more easily. If you have any dental work done, tell your dentist you are receiving this medicine. You may get drowsy or dizzy. Do not drive, use machinery, or do anything that needs mental alertness until you know how this medicine affects you. Do not become pregnant while taking this medicine or for 1 year after stopping it. Women should inform their doctor if they wish to become pregnant or think they might be pregnant. Men should not father a child while taking this medicine and for 4 months after stopping it. There is a potential for serious side effects to an unborn child. Talk to your health care professional or pharmacist for  more information. Do not breast-feed an infant while taking this medicine. This medicine may interfere with the ability to have a child. This medicine has caused ovarian failure in some women. This medicine has caused reduced sperm counts in some men. You should talk with your doctor or health care professional if you are concerned about your fertility. If you are going to have surgery, tell your doctor or health care professional that you have taken this medicine. What side effects may I notice from receiving this medicine? Side effects that you should report to your doctor or health care professional as soon as possible: -allergic reactions like skin rash, itching or hives, swelling of the face, lips, or tongue -low blood counts - this medicine may decrease the number of white blood cells, red blood cells and platelets. You may be at increased risk for infections and bleeding. -signs of infection - fever or chills, cough, sore throat, pain or difficulty passing urine -signs of decreased platelets or bleeding - bruising, pinpoint red spots on the skin, black, tarry stools, blood in the urine -signs of decreased red blood cells - unusually weak or tired, fainting spells, lightheadedness -breathing problems -dark urine -dizziness -palpitations -swelling of the ankles, feet, hands -trouble passing urine or change in the amount of urine -weight gain -yellowing of the eyes or skin Side effects that   usually do not require medical attention (report to your doctor or health care professional if they continue or are bothersome): -changes in nail or skin color -hair loss -missed menstrual periods -mouth sores -nausea, vomiting This list may not describe all possible side effects. Call your doctor for medical advice about side effects. You may report side effects to FDA at 1-800-FDA-1088. Where should I keep my medicine? This drug is given in a hospital or clinic and will not be stored at  home. NOTE: This sheet is a summary. It may not cover all possible information. If you have questions about this medicine, talk to your doctor, pharmacist, or health care provider.  2018 Elsevier/Gold Standard (2011-11-29 16:22:58)  

## 2016-05-10 NOTE — Assessment & Plan Note (Signed)
Unfortunately, recent bone marrow biopsy at [facility] show persistent disease, at least 20% plasma cells Per discussion with his transplant physician, we will proceed with second line treatment. The treatment decision is based on the publication below: We discussed the role of chemotherapy. The intent is for palliative.   Carfilzomib, cyclophosphamide, and dexamethasone in patients with newly diagnosed multiple myeloma: a multicenter, phase 2 study Claudie Leach, Erick Blinks Petrucci, Geronimo Running, [place], Velva Harman, [place], [employer], Luana Boc[place], [place], [place], [place], [place], Freida Busman [place], Pieter [place]  Blood 2014 [phone]; doi: [email or web address]   This multicenter, open-label phase 2 trial determined the safety and efficacy of carfilzomib, a novel and irreversible proteasome inhibitor, in combination with cyclophosphamide and dexamethasone (CCyd) in patients with newly diagnosed multiple myeloma (NDMM) ?47 years of age or who were ineligible for autologous stem cell transplantation. Patients (N = 58) received CCyd for up to 9 28-day cycles, followed by maintenance with carfilzomib until progression or intolerance.   After a median of 9 CCyd induction cycles (range 1-9), 95% of patients achieved at least a partial response, 71% achieved at least a very good partial response, 49% achieved at least a near complete response, and 20% achieved stringent complete response.  After a median follow-up of 18 months, the 2-year progression-free survival and overall survival rates were 76% and 87%, respectively. The most frequent grade 3 to 5 toxicities were neutropenia (20%), anemia (11%), and cardiopulmonary adverse events (7%). Peripheral neuropathy was limited  to grades 1 and 2 (9%). Fourteen percent of patients discontinued treatment because of adverse events, and 21% of patients required carfilzomib dose reductions. In summary, results showed high complete response rates and a good safety profile  After much discussion regarding the risks, benefit, side effects of treatment the patient agreed to proceed with the plan of care. I would get port placement I will order baseline echocardiogram in cardiac markers The plan would be to start him on first dose of treatment on April 24 I will see him prior to second week of treatment He would get myeloma panel drawn on a monthly basis

## 2016-05-10 NOTE — Assessment & Plan Note (Signed)
The patient is aware he has incurable disease and treatment is strictly palliative. 

## 2016-05-10 NOTE — Assessment & Plan Note (Signed)
This is stable He will continue pain medication as prescribed

## 2016-05-10 NOTE — Telephone Encounter (Signed)
Per 4/13 los. - no additional appts needed, just print schedule.

## 2016-05-10 NOTE — Progress Notes (Signed)
[place] OFFICE PROGRESS NOTE  Patient Care Team: [name], MD as PCP - General (Internal Medicine)  SUMMARY OF ONCOLOGIC HISTORY:   Multiple myeloma not having achieved remission ([place])   06/23/2015 - 06/28/2015 Hospital Admission    The patient was admitted to the hospital due to gait ataxia and back pain. He was subsequently found to have cord compression underwent surgery and was discharged home      06/24/2015 Imaging    Abnormal appearance of the T6 vertebral body, highly suspicious for possible osseous metastasis. Associated pathologic fracture withup to 30% height loss. There is associated abnormal soft tissue density within the ventral epidural space,      06/24/2015 Imaging    MRI lumbar: Focal osseous lesion with abnormal enhancement involving the right pedicle of L3, suspicious for possible osseous metastasisgiven the findings in the thoracic spine. Question additional focal lesion within the right iliac wing as above.        06/25/2015 Pathology Results    Accession: [id number] bone biopsy come from plasma cell neoplasm.      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 Zometa is not given due to inability to get dental clearance      12/14/2015 - 04/24/2016 Chemotherapy    He is started on maintenance treatment with Revlimid only      12/18/2015 Imaging    MRI thoracic and lumbar spine showed numerous enhancing foci throughout the thoracic and lumbar spine with several new small foci in the lumbar spine in comparison with prior MRI compatible with metastatic disease. Stable loss of height of the T3, T4, and T6 vertebral bodies and new postsurgical changes  related to T6 laminectomy. No significant epidural disease or evidence for cord compression. No abnormal enhancement of the spinal cord or cauda equina.      05/02/2016 Bone Marrow Biopsy    Outside bone marrow biopsy showed 20% myeloma involvement       INTERVAL HISTORY: Please see below for problem oriented charting. He returns today to review recommendation to switch treatment due to persistent myeloma cells in his bone marrow biopsy He continues to have chronic back pain and peripheral neuropathy, stable on current prescription pain medicine and gabapentin He complained of recent myalgias and arthralgias with Zometa He also complained of recent pain from bone marrow biopsy and requests future bone marrow biopsies to be done here He denies recent infection  REVIEW OF SYSTEMS:   Constitutional: Denies fevers, chills or abnormal weight loss Eyes: Denies blurriness of vision Ears, nose, mouth, throat, and face: Denies mucositis or sore throat Respiratory: Denies cough, dyspnea or wheezes Cardiovascular: Denies palpitation, chest discomfort or lower extremity swelling Gastrointestinal:  Denies nausea, heartburn or change in bowel habits Skin: Denies abnormal skin rashes Lymphatics: Denies new lymphadenopathy or easy bruising Neurological:Denies numbness, tingling or new weaknesses Behavioral/Psych: Mood is stable, no new changes  All other systems were reviewed with the patient and are negative.  I have reviewed the past medical history, past surgical history, social history and family history with the patient and they are unchanged from previous note.  ALLERGIES:  has No Known Allergies.  MEDICATIONS:  Current  Outpatient Prescriptions  Medication Sig Dispense Refill  . acyclovir (ZOVIRAX) 400 MG tablet Take 1 tablet (400 mg total) by mouth 2 (two) times daily. 60 tablet 11  . aspirin EC 81 MG tablet Take 81 mg by mouth daily.    . calcium carbonate (TUMS) 500 MG chewable tablet  Chew 1 tablet (200 mg of elemental calcium total) by mouth 2 (two) times daily. 60 tablet 9  . cholecalciferol (VITAMIN D) 1000 units tablet Take 1,000 Units by mouth daily.    Marland Kitchen gabapentin (NEURONTIN) 300 MG capsule Take 2 capsules (600 mg total) by mouth 3 (three) times daily. 90 capsule 6  . HYDROcodone-acetaminophen (NORCO/VICODIN) 5-325 MG tablet Take 1 tablet by mouth 3 (three) times daily as needed for moderate pain. 90 tablet 0  . ondansetron (ZOFRAN) 8 MG tablet Take by mouth.    . lidocaine-prilocaine (EMLA) cream Apply to affected area once 30 g 3  . ondansetron (ZOFRAN) 8 MG tablet Take 1 tablet (8 mg total) by mouth every 8 (eight) hours as needed for refractory nausea / vomiting. 30 tablet 1  . prochlorperazine (COMPAZINE) 10 MG tablet Take 1 tablet (10 mg total) by mouth every 6 (six) hours as needed (Nausea or vomiting). 30 tablet 1  . senna-docusate (SENOKOT-S) 8.6-50 MG tablet Take 1 tablet by mouth at bedtime as needed for mild constipation. (Patient not taking: Reported on 03/18/2016) 30 tablet 0   No current facility-administered medications for this visit.     PHYSICAL EXAMINATION: ECOG PERFORMANCE STATUS: 0 - Asymptomatic  Vitals:   05/10/16 1228  BP: 140/69  Pulse: 89  Resp: 18  Temp: 98.5 F (36.9 C)   Filed Weights   05/10/16 1228  Weight: 220 lb 11.2 oz (100.1 kg)    GENERAL:alert, no distress and comfortable SKIN: skin color, texture, turgor are normal, no rashes or significant lesions EYES: normal, Conjunctiva are pink and non-injected, sclera clear OROPHARYNX:no exudate, no erythema and lips, buccal mucosa, and tongue normal  NECK: supple, thyroid normal size, non-tender, without nodularity LYMPH:  no palpable lymphadenopathy in the cervical, axillary or inguinal LUNGS: clear to auscultation and percussion with normal breathing effort HEART: regular rate & rhythm and no murmurs and no lower extremity edema ABDOMEN:abdomen soft, non-tender and normal  bowel sounds Musculoskeletal:no cyanosis of digits and no clubbing  NEURO: alert & oriented x 3 with fluent speech, no focal motor/sensory deficits  LABORATORY DATA:  I have reviewed the data as listed    Component Value Date/Time   NA 141 04/11/2016 1104   K 3.8 04/11/2016 1104   CL 110 07/20/2015 0755   CO2 28 04/11/2016 1104   GLUCOSE 70 04/11/2016 1104   BUN 13.7 04/11/2016 1104   CREATININE 1.0 04/11/2016 1104   CALCIUM 9.6 04/11/2016 1104   PROT 6.8 04/11/2016 1104   PROT 6.5 04/11/2016 1104   ALBUMIN 3.9 04/11/2016 1104   AST 14 04/11/2016 1104   ALT 17 04/11/2016 1104   ALKPHOS 53 04/11/2016 1104   BILITOT 0.67 04/11/2016 1104   GFRNONAA 49 (L) 07/20/2015 0755   GFRAA 56 (L) 07/20/2015 0755    No results found for: SPEP, UPEP  Lab Results  Component Value Date   WBC 3.3 (L) 04/11/2016   NEUTROABS 1.6 04/11/2016   HGB 13.6 04/11/2016   HCT 40.4 04/11/2016   MCV 89.4 04/11/2016   PLT 186 04/11/2016      Chemistry      Component Value Date/Time   NA 141 04/11/2016  1104   K 3.8 04/11/2016 1104   CL 110 07/20/2015 0755   CO2 28 04/11/2016 1104   BUN 13.7 04/11/2016 1104   CREATININE 1.0 04/11/2016 1104      Component Value Date/Time   CALCIUM 9.6 04/11/2016 1104   ALKPHOS 53 04/11/2016 1104   AST 14 04/11/2016 1104   ALT 17 04/11/2016 1104   BILITOT 0.67 04/11/2016 1104      ASSESSMENT & PLAN:  Multiple myeloma not having achieved remission ([place]) Unfortunately, recent bone [facility] at [facility] show persistent disease, at least 20% plasma cells Per discussion with his transplant physician, we will proceed with second line treatment. The treatment decision is based on the publication below: We discussed the role of chemotherapy. The intent is for palliative.   Carfilzomib, cyclophosphamide, and dexamethasone in patients with newly diagnosed multiple myeloma: a multicenter, phase 2 study Claudie Leach, Erick Blinks Petrucci, Geronimo Running, Edgar Conticello, Velva Harman, [place] Magarotto, [employer], Luana Boccadifuoco, [place], [place], [place], [place], Freida Busman [place], Pieter Bushnell  Blood 2014 [phone]; doi: [employer]   This multicenter, open-label phase 2 trial determined the safety and efficacy of carfilzomib, a novel and irreversible proteasome inhibitor, in combination with cyclophosphamide and dexamethasone (CCyd) in patients with newly diagnosed multiple myeloma (NDMM) ?47 years of age or who were ineligible for autologous stem cell transplantation. Patients (N = 58) received CCyd for up to 9 28-day cycles, followed by maintenance with carfilzomib until progression or intolerance.   After a median of 9 CCyd induction cycles (range 1-9), 95% of patients achieved at least a partial response, 71% achieved at least a very good partial response, 49% achieved at least a near complete response, and 20% achieved stringent complete response.  After a median follow-up of 18 months, the 2-year progression-free survival and overall survival rates were 76% and 87%, respectively. The most frequent grade 3 to 5 toxicities were neutropenia (20%), anemia (11%), and cardiopulmonary adverse events (7%). Peripheral neuropathy was limited to grades 1 and 2 (9%). Fourteen percent of patients discontinued treatment because of adverse events, and 21% of patients required carfilzomib dose reductions. In summary, results showed high complete response rates and a good safety profile  After much discussion regarding the risks, benefit, side effects of treatment the patient agreed to proceed with the plan of care. 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 Continue the same dose for now  Goals of care, counseling/discussion The patient is aware he has incurable disease and treatment is strictly palliative.   Orders Placed This Encounter  Procedures  . IR FLUORO GUIDE PORT INSERTION RIGHT    Standing Status:   Future    Standing Expiration Date:   07/10/2017    Order Specific Question:   Reason for Exam (SYMPTOM  OR DIAGNOSIS REQUIRED)    Answer:   need port for chemo    Order Specific Question:  Preferred Imaging Location?    Answer:   [facility]  . CBC with Differential    Standing Status:   Standing    Number of Occurrences:   20    Standing Expiration Date:   05/11/2017  . Comprehensive metabolic panel    Standing Status:   Standing    Number of Occurrences:   20    Standing Expiration Date:   05/11/2017  . Troponin I    Standing Status:   Future    Standing Expiration Date:   05/10/2017  . BNP (Brain natriuretic peptide)    Standing Status:   Future    Standing Expiration Date:   05/10/2017  . ECHOCARDIOGRAM LIMITED    Standing Status:   Future    Standing Expiration Date:   08/09/2017    Order Specific Question:   Where should this test be performed    Answer:   [facility]    Order Specific Question:   Complete or Limited study?    Answer:   Limited    Order Specific Question:   Does the patient have a known history of hypersensitivity to Perflutren (Chief Technology Officer for echocardiograms - CHECK ALLERGIES)    Answer:   No    Order Specific Question:   ADMINISTER PERFLUTERN    Answer:   ADMINISTER PERFLUTREN     Order Specific Question:   Expected Date:    Answer:   1 week    Order Specific Question:   Reason for exam-Echo    Answer:   Chemo  V67.2 / Z09   All questions were answered. The patient knows to call the clinic with any problems, questions or concerns. No barriers to learning was detected. I spent 30 minutes counseling the patient face to face. The total time spent in the appointment was 40 minutes and more than 50% was on counseling and review of test results     [name], MD 05/10/2016 1:08 PM

## 2016-05-10 NOTE — Assessment & Plan Note (Signed)
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 Continue the same dose for now 

## 2016-05-15 ENCOUNTER — Other Ambulatory Visit: Payer: Self-pay | Admitting: Physician Assistant

## 2016-05-15 ENCOUNTER — Other Ambulatory Visit: Payer: Self-pay | Admitting: Hematology and Oncology

## 2016-05-15 ENCOUNTER — Ambulatory Visit (HOSPITAL_COMMUNITY)
Admission: RE | Admit: 2016-05-15 | Discharge: 2016-05-15 | Disposition: A | Discharge status: Home / Self Care | Payer: Medicaid Other | Source: Ambulatory Visit | Attending: Hematology and Oncology | Admitting: Hematology and Oncology

## 2016-05-15 DIAGNOSIS — I083 Combined rheumatic disorders of mitral, aortic and tricuspid valves: Secondary | ICD-10-CM | POA: Diagnosis not present

## 2016-05-15 DIAGNOSIS — C9000 Multiple myeloma not having achieved remission: Secondary | ICD-10-CM | POA: Insufficient documentation

## 2016-05-15 MED ORDER — PERFLUTREN LIPID MICROSPHERE: 1.0000 mL | INTRAVENOUS | Status: AC | PRN

## 2016-05-15 NOTE — Progress Notes (Signed)
  Echocardiogram 2D Echocardiogram has been performed.  Tresa Res 05/15/2016, 10:54 AM

## 2016-05-16 ENCOUNTER — Ambulatory Visit (HOSPITAL_COMMUNITY)
Admission: RE | Admit: 2016-05-16 | Discharge: 2016-05-16 | Disposition: A | Discharge status: Home / Self Care | Payer: Medicaid Other | Source: Ambulatory Visit | Attending: Hematology and Oncology | Admitting: Hematology and Oncology

## 2016-05-16 ENCOUNTER — Other Ambulatory Visit: Payer: Self-pay | Admitting: Hematology and Oncology

## 2016-05-16 ENCOUNTER — Encounter (HOSPITAL_COMMUNITY): Payer: Self-pay | Admitting: Interventional Radiology

## 2016-05-16 DIAGNOSIS — Z87891 Personal history of nicotine dependence: Secondary | ICD-10-CM | POA: Insufficient documentation

## 2016-05-16 DIAGNOSIS — C9000 Multiple myeloma not having achieved remission: Secondary | ICD-10-CM | POA: Insufficient documentation

## 2016-05-16 DIAGNOSIS — Z7982 Long term (current) use of aspirin: Secondary | ICD-10-CM | POA: Insufficient documentation

## 2016-05-16 DIAGNOSIS — C7951 Secondary malignant neoplasm of bone: Secondary | ICD-10-CM | POA: Insufficient documentation

## 2016-05-16 HISTORY — PX: IR US GUIDE VASC ACCESS RIGHT: IMG2390

## 2016-05-16 HISTORY — PX: IR FLUORO GUIDE PORT INSERTION RIGHT: IMG5741

## 2016-05-16 LAB — PROTIME-INR
INR: 1.01
Prothrombin Time: 13.3 seconds (ref 11.4–15.2)

## 2016-05-16 LAB — CBC
HEMATOCRIT: 38.4 % — AB (ref 39.0–52.0)
HEMOGLOBIN: 13 g/dL (ref 13.0–17.0)
MCH: 29.7 pg (ref 26.0–34.0)
MCHC: 33.9 g/dL (ref 30.0–36.0)
MCV: 87.9 fL (ref 78.0–100.0)
Platelets: 219 10*3/uL (ref 150–400)
RBC: 4.37 MIL/uL (ref 4.22–5.81)
RDW: 13.5 % (ref 11.5–15.5)
WBC: 4.4 10*3/uL (ref 4.0–10.5)

## 2016-05-16 LAB — APTT: aPTT: 32 seconds (ref 24–36)

## 2016-05-16 MED ORDER — MIDAZOLAM HCL 2 MG/2ML IJ SOLN
INTRAMUSCULAR | Status: AC | PRN
  Administered 2016-05-16: 0.5 mg via INTRAVENOUS
  Administered 2016-05-16: 1 mg via INTRAVENOUS
  Administered 2016-05-16 (×3): 0.5 mg via INTRAVENOUS
  Administered 2016-05-16: 1 mg via INTRAVENOUS

## 2016-05-16 MED ORDER — LIDOCAINE HCL 1 % IJ SOLN
INTRAMUSCULAR | Status: AC | PRN
  Administered 2016-05-16: 10 mL

## 2016-05-16 MED ORDER — SODIUM CHLORIDE 0.9 % IV SOLN: INTRAVENOUS | Status: DC

## 2016-05-16 MED ORDER — CEFAZOLIN SODIUM-DEXTROSE 2-4 GM/100ML-% IV SOLN
INTRAVENOUS | Status: AC
  Filled 2016-05-16: qty 100

## 2016-05-16 MED ORDER — CEFAZOLIN SODIUM-DEXTROSE 2-4 GM/100ML-% IV SOLN
2.0000 g | Freq: Once | INTRAVENOUS | Status: AC
  Administered 2016-05-16: 2 g via INTRAVENOUS

## 2016-05-16 MED ORDER — HEPARIN SOD (PORK) LOCK FLUSH 100 UNIT/ML IV SOLN
INTRAVENOUS | Status: AC
  Filled 2016-05-16: qty 5

## 2016-05-16 MED ORDER — FENTANYL CITRATE (PF) 100 MCG/2ML IJ SOLN
INTRAMUSCULAR | Status: AC
  Filled 2016-05-16: qty 4

## 2016-05-16 MED ORDER — MIDAZOLAM HCL 2 MG/2ML IJ SOLN
INTRAMUSCULAR | Status: AC
  Filled 2016-05-16: qty 4

## 2016-05-16 MED ORDER — LIDOCAINE HCL 1 % IJ SOLN
INTRAMUSCULAR | Status: AC
  Filled 2016-05-16: qty 20

## 2016-05-16 MED ORDER — FENTANYL CITRATE (PF) 100 MCG/2ML IJ SOLN
INTRAMUSCULAR | Status: AC | PRN
  Administered 2016-05-16: 50 ug via INTRAVENOUS
  Administered 2016-05-16 (×2): 25 ug via INTRAVENOUS

## 2016-05-16 MED ORDER — SODIUM CHLORIDE 0.9 % IV SOLN
INTRAVENOUS | Status: AC | PRN
  Administered 2016-05-16: 10 mL/h via INTRAVENOUS

## 2016-05-16 NOTE — Discharge Instructions (Addendum)
Implanted Port Home Guide °An implanted port is a type of central line that is placed under the skin. Central lines are used to provide IV access when treatment or nutrition needs to be given through a person’s veins. Implanted ports are used for long-term IV access. An implanted port may be placed because: °· You need IV medicine that would be irritating to the small veins in your hands or arms. °· You need long-term IV medicines, such as antibiotics. °· You need IV nutrition for a long period. °· You need frequent blood draws for lab tests. °· You need dialysis. °Implanted ports are usually placed in the chest area, but they can also be placed in the upper arm, the abdomen, or the leg. An implanted port has two main parts: °· Reservoir. The reservoir is round and will appear as a small, raised area under your skin. The reservoir is the part where a needle is inserted to give medicines or draw blood. °· Catheter. The catheter is a thin, flexible tube that extends from the reservoir. The catheter is placed into a large vein. Medicine that is inserted into the reservoir goes into the catheter and then into the vein. °How will I care for my incision site? °Do not get the incision site wet. Bathe or shower as directed by your health care provider. °How is my port accessed? °Special steps must be taken to access the port: °· Before the port is accessed, a numbing cream can be placed on the skin. This helps numb the skin over the port site. °· Your health care provider uses a sterile technique to access the port. °¨ Your health care provider must put on a mask and sterile gloves. °¨ The skin over your port is cleaned carefully with an antiseptic and allowed to dry. °¨ The port is gently pinched between sterile gloves, and a needle is inserted into the port. °· Only "non-coring" port needles should be used to access the port. Once the port is accessed, a blood return should be checked. This helps ensure that the port is  in the vein and is not clogged. °· If your port needs to remain accessed for a constant infusion, a clear (transparent) bandage will be placed over the needle site. The bandage and needle will need to be changed every week, or as directed by your health care provider. °· Keep the bandage covering the needle clean and dry. Do not get it wet. Follow your health care provider’s instructions on how to take a shower or bath while the port is accessed. °· If your port does not need to stay accessed, no bandage is needed over the port. °What is flushing? °Flushing helps keep the port from getting clogged. Follow your health care provider’s instructions on how and when to flush the port. Ports are usually flushed with saline solution or a medicine called heparin. The need for flushing will depend on how the port is used. °· If the port is used for intermittent medicines or blood draws, the port will need to be flushed: °¨ After medicines have been given. °¨ After blood has been drawn. °¨ As part of routine maintenance. °· If a constant infusion is running, the port may not need to be flushed. °How long will my port stay implanted? °The port can stay in for as long as your health care provider thinks it is needed. When it is time for the port to come out, surgery will be done to remove it.   The procedure is similar to the one performed when the port was put in. °When should I seek immediate medical care? °When you have an implanted port, you should seek immediate medical care if: °· You notice a bad smell coming from the incision site. °· You have swelling, redness, or drainage at the incision site. °· You have more swelling or pain at the port site or the surrounding area. °· You have a fever that is not controlled with medicine. °This information is not intended to replace advice given to you by your health care provider. Make sure you discuss any questions you have with your health care provider. °Document Released:  01/14/2005 Document Revised: 06/22/2015 Document Reviewed: 09/21/2012 °Elsevier Interactive Patient Education © 2017 [employer] °Moderate Conscious Sedation, Adult, Care After °These instructions provide you with information about caring for yourself after your procedure. Your health care provider may also give you more specific instructions. Your treatment has been planned according to current medical practices, but problems sometimes occur. Call your health care provider if you have any problems or questions after your procedure. °What can I expect after the procedure? °After your procedure, it is common: °· To feel sleepy for several hours. °· To feel clumsy and have poor balance for several hours. °· To have poor judgment for several hours. °· To vomit if you eat too soon. °Follow these instructions at home: °For at least 24 hours after the procedure:  ° °· Do not: °¨ Participate in activities where you could fall or become injured. °¨ Drive. °¨ Use heavy machinery. °¨ Drink alcohol. °¨ Take sleeping pills or medicines that cause drowsiness. °¨ Make important decisions or sign legal documents. °¨ Take care of children on your own. °· Rest. °Eating and drinking  °· Follow the diet recommended by your health care provider. °· If you vomit: °¨ Drink water, juice, or soup when you can drink without vomiting. °¨ Make sure you have little or no nausea before eating solid foods. °General instructions  °· Have a responsible adult stay with you until you are awake and alert. °· Take over-the-counter and prescription medicines only as told by your health care provider. °· If you smoke, do not smoke without supervision. °· Keep all follow-up visits as told by your health care provider. This is important. °Contact a health care provider if: °· You keep feeling nauseous or you keep vomiting. °· You feel light-headed. °· You develop a rash. °· You have a fever. °Get help right away if: °· You have trouble breathing. °This  information is not intended to replace advice given to you by your health care provider. Make sure you discuss any questions you have with your health care provider. °Document Released: 11/04/2012 Document Revised: 06/19/2015 Document Reviewed: 05/06/2015 °Elsevier Interactive Patient Education © 2017 [employer] ° °

## 2016-05-16 NOTE — H&P (Signed)
Chief Complaint: Patient was seen in consultation today for multiple myeloma  Referring Physician(s):  [name]  Supervising Physician: Marybelle Killings  Patient Status: [facility] - Out-pt  History of Present Illness: Bradley Hunt is a 46 y.o. male with past medical history of multiple myeloma now with persistent and metastatic disease who has plans for palliative chemotherapy.  IR consulted for Port-A-Cath placement at the request of Dr. Alvy Bimler.   Patient has been NPO.  He does not take blood thinners.  He has been in his usual state of health.   Past Medical History:  Diagnosis Date  . Bone metastases ([place])    T spine and L spine  . Multiple myeloma not having achieved remission ([place]) 06/25/15    Past Surgical History:  Procedure Laterality Date  . ANTERIOR CRUCIATE LIGAMENT REPAIR    . LAMINECTOMY N/A 06/25/2015   Procedure: Thoracic six LAMINECTOMY RESECTION FOR TUMOR;  Surgeon: Consuella Lose, MD;  Location: [facility];  Service: Neurosurgery;  Laterality: N/A;    Allergies: Patient has no known allergies.  Medications: Prior to Admission medications   Medication Sig Start Date End Date Taking? Authorizing Provider  acyclovir (ZOVIRAX) 400 MG tablet Take 1 tablet (400 mg total) by mouth 2 (two) times daily. 03/18/16  Yes [name], MD  aspirin EC 81 MG tablet Take 81 mg by mouth daily.   Yes Historical Provider, MD  calcium carbonate (TUMS) 500 MG chewable tablet Chew 1 tablet (200 mg of elemental calcium total) by mouth 2 (two) times daily. 04/18/16  Yes [name], MD  cholecalciferol (VITAMIN D) 1000 units tablet Take 1,000 Units by mouth daily.   Yes Historical Provider, MD  gabapentin (NEURONTIN) 300 MG capsule Take 2 capsules (600 mg total) by mouth 3 (three) times daily. Patient taking differently: Take 600 mg by mouth at bedtime as needed (nerve pain).  12/19/15  Yes [name], MD  HYDROcodone-acetaminophen (NORCO/VICODIN) 5-325 MG tablet Take 1 tablet by  mouth 3 (three) times daily as needed for moderate pain. 05/10/16  Yes [name], MD  senna-docusate (SENOKOT-S) 8.6-50 MG tablet Take 1 tablet by mouth at bedtime as needed for mild constipation. 06/28/15  Yes Maryann Mikhail, DO  lidocaine-prilocaine (EMLA) cream Apply to affected area once 05/10/16   [name], MD  ondansetron (ZOFRAN) 8 MG tablet Take 1 tablet (8 mg total) by mouth every 8 (eight) hours as needed for refractory nausea / vomiting. 05/10/16   [name], MD  prochlorperazine (COMPAZINE) 10 MG tablet Take 1 tablet (10 mg total) by mouth every 6 (six) hours as needed (Nausea or vomiting). 05/10/16   [name], MD     Family History  Problem Relation Age of Onset  . Cancer Neg Hx     Social History   Social History  . Marital status: Single    Spouse name: N/A  . Number of children: 1  . Years of education: N/A   Social History Main Topics  . Smoking status: Former Smoker    Quit date: 01/28/1997  . Smokeless tobacco: Never Used  . Alcohol use No  . Drug use: No  . Sexual activity: Yes     Comment: friend Amadou next of kin. 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 Not on file   Social History Narrative  . No narrative on file    Review of Systems  Constitutional: Negative for fatigue and fever.  Respiratory: Negative for cough and  shortness of breath.   Cardiovascular: Negative for chest pain.  Psychiatric/Behavioral: Negative for behavioral problems and confusion.    Vital Signs: BP (!) 131/96   Pulse 67   Resp 16   Ht 6' 2"  (1.88 m)   Wt 220 lb (99.8 kg)   SpO2 100%   BMI 28.25 kg/m   Physical Exam  Constitutional: He is oriented to person, place, and time. He appears well-developed.  Cardiovascular: Normal rate and regular rhythm.   Pulmonary/Chest: Effort normal and breath sounds normal. No respiratory distress.  Neurological: He is alert and oriented to person, place, and time.  Skin: Skin is warm.  Psychiatric: He has a  normal mood and affect. His behavior is normal. Judgment and thought content normal.  Nursing note and vitals reviewed.   Mallampati Score:  MD Evaluation Airway: WNL Heart: WNL Abdomen: WNL Chest/ Lungs: WNL ASA  Classification: 3 Mallampati/Airway Score: Two  Imaging: No results found.  Labs:  CBC:  Recent Labs  02/12/16 1054 03/11/16 1038 04/11/16 1104 05/16/16 0800  WBC 4.1 3.6* 3.3* 4.4  HGB 13.0 13.6 13.6 13.0  HCT 37.8* 39.8 40.4 38.4*  PLT 225 201 186 219    COAGS:  Recent Labs  06/24/15 1541 05/16/16 0800  INR 1.14 1.01  APTT 28 32    BMP:  Recent Labs  06/24/15 0437  06/28/15 0539 07/20/15 0755  01/11/16 1243 02/12/16 1054 03/11/16 1038 04/11/16 1104  NA 140  < > 137 140  < > 142 142 142 141  K 4.1  < > 4.7 4.0  < > 3.4* 3.9 3.8 3.8  CL 109  --  103 110  --   --   --   --   --   CO2 23  --  29 24  < > 26 26 29 28   GLUCOSE 99  < > 122* 105*  < > 88 75 104 70  BUN 13  --  16 23*  < > 12.3 18.5 15.4 13.7  CALCIUM 9.9  --  9.5 9.2  < > 9.4 9.8 9.7 9.6  CREATININE 0.96  --  0.86 1.64*  < > 1.0 1.0 1.1 1.0  GFRNONAA >60  --  >60 49*  --   --   --   --   --   GFRAA >60  --  >60 56*  --   --   --   --   --   < > = values in this interval not displayed.  LIVER FUNCTION TESTS:  Recent Labs  01/11/16 1243 02/12/16 1054  03/11/16 1038 04/11/16 1104  BILITOT 0.87 0.70  --  0.98 0.67  AST 15 18  --  17 14  ALT 18 22  --  19 17  ALKPHOS 54 53  --  53 53  PROT 6.7  6.1 6.7  < > 6.9  6.5 6.8  6.5  ALBUMIN 3.8 4.0  --  4.0 3.9  < > = values in this interval not displayed.  TUMOR MARKERS: No results for input(s): AFPTM, CEA, CA199, CHROMGRNA in the last 8760 hours.  Assessment and Plan: Patient with history of multiple myeloma presents for Port-A-Cath placement at the request of Dr. Alvy Bimler.  He has been NPO.  He does not take blood thinners.  He has been in his usual state of health.  Risks and Benefits discussed with the patient  including, but not limited to bleeding, infection, pneumothorax, or fibrin sheath development and need for  additional procedures. All of the patient's questions were answered, patient is agreeable to proceed. Consent signed and in chart.  Thank you for this interesting consult.  I greatly enjoyed meeting [facility] and look forward to participating in their care.  A copy of this report was sent to the requesting provider on this date.  Electronically Signed: Docia Barrier 05/16/2016, 8:58 AM   I spent a total of  30 Minutes   in face to face in clinical consultation, greater than 50% of which was counseling/coordinating care for multiple myeloma

## 2016-05-16 NOTE — Procedures (Signed)
RIJV PAC SVC RA EBL 0 Comp 0 

## 2016-05-21 ENCOUNTER — Ambulatory Visit: Payer: Medicaid Other

## 2016-05-21 ENCOUNTER — Ambulatory Visit (HOSPITAL_BASED_OUTPATIENT_CLINIC_OR_DEPARTMENT_OTHER): Payer: Medicaid Other

## 2016-05-21 ENCOUNTER — Other Ambulatory Visit (HOSPITAL_BASED_OUTPATIENT_CLINIC_OR_DEPARTMENT_OTHER): Payer: Medicaid Other

## 2016-05-21 ENCOUNTER — Other Ambulatory Visit: Payer: Self-pay | Admitting: Hematology and Oncology

## 2016-05-21 VITALS — BP 133/88 | HR 67 | Temp 98.1°F | Resp 18

## 2016-05-21 DIAGNOSIS — C9000 Multiple myeloma not having achieved remission: Secondary | ICD-10-CM

## 2016-05-21 DIAGNOSIS — Z5112 Encounter for antineoplastic immunotherapy: Secondary | ICD-10-CM | POA: Diagnosis not present

## 2016-05-21 DIAGNOSIS — Z5111 Encounter for antineoplastic chemotherapy: Secondary | ICD-10-CM

## 2016-05-21 LAB — COMPREHENSIVE METABOLIC PANEL
ALT: 18 U/L (ref 0–55)
AST: 16 U/L (ref 5–34)
Albumin: 3.9 g/dL (ref 3.5–5.0)
Alkaline Phosphatase: 55 U/L (ref 40–150)
Anion Gap: 6 mEq/L (ref 3–11)
BUN: 14.5 mg/dL (ref 7.0–26.0)
CHLORIDE: 109 meq/L (ref 98–109)
CO2: 26 mEq/L (ref 22–29)
CREATININE: 1 mg/dL (ref 0.7–1.3)
Calcium: 9.6 mg/dL (ref 8.4–10.4)
EGFR: >90 mL/min/{1.73_m2} (ref >90)
Glucose: 110 mg/dl (ref 70–140)
Potassium: 4 mEq/L (ref 3.5–5.1)
SODIUM: 142 meq/L (ref 136–145)
TOTAL PROTEIN: 6.7 g/dL (ref 6.4–8.3)
Total Bilirubin: 0.79 mg/dL (ref 0.20–1.20)

## 2016-05-21 LAB — CBC WITH DIFFERENTIAL/PLATELET
BASO%: 0.5 % (ref 0.0–2.0)
BASOS ABS: 0 10*3/uL (ref 0.0–0.1)
EOS%: 3 % (ref 0.0–7.0)
Eosinophils Absolute: 0.1 10*3/uL (ref 0.0–0.5)
HEMATOCRIT: 37.9 % — AB (ref 38.4–49.9)
HEMOGLOBIN: 13 g/dL (ref 13.0–17.1)
LYMPH#: 1.4 10*3/uL (ref 0.9–3.3)
LYMPH%: 35.4 % (ref 14.0–49.0)
MCH: 29.7 pg (ref 27.2–33.4)
MCHC: 34.3 g/dL (ref 32.0–36.0)
MCV: 86.7 fL (ref 79.3–98.0)
MONO#: 0.3 10*3/uL (ref 0.1–0.9)
MONO%: 7.3 % (ref 0.0–14.0)
NEUT%: 53.8 % (ref 39.0–75.0)
NEUTROS ABS: 2.1 10*3/uL (ref 1.5–6.5)
Platelets: 185 10*3/uL (ref 140–400)
RBC: 4.37 10*6/uL (ref 4.20–5.82)
RDW: 13.8 % (ref 11.0–14.6)
WBC: 4 10*3/uL (ref 4.0–10.3)

## 2016-05-21 MED ORDER — HEPARIN SOD (PORK) LOCK FLUSH 100 UNIT/ML IV SOLN
500.0000 [IU] | Freq: Once | INTRAVENOUS | Status: AC | PRN
  Administered 2016-05-21: 500 [IU]
  Filled 2016-05-21: qty 5

## 2016-05-21 MED ORDER — SODIUM CHLORIDE 0.9 % IV SOLN
300.0000 mg/m2 | Freq: Once | INTRAVENOUS | Status: AC
  Administered 2016-05-21: 700 mg via INTRAVENOUS
  Filled 2016-05-21: qty 35

## 2016-05-21 MED ORDER — SODIUM CHLORIDE 0.9% FLUSH
10.0000 mL | INTRAVENOUS | Status: DC | PRN
  Administered 2016-05-21: 10 mL
  Filled 2016-05-21: qty 10

## 2016-05-21 MED ORDER — DEXTROSE 5 % IV SOLN
20.0000 mg/m2 | Freq: Once | INTRAVENOUS | Status: AC
  Administered 2016-05-21: 44 mg via INTRAVENOUS
  Filled 2016-05-21: qty 22

## 2016-05-21 MED ORDER — DEXAMETHASONE SODIUM PHOSPHATE 10 MG/ML IJ SOLN
INTRAMUSCULAR | Status: AC
  Filled 2016-05-21: qty 1

## 2016-05-21 MED ORDER — DEXAMETHASONE SODIUM PHOSPHATE 100 MG/10ML IJ SOLN: 10.0000 mg | Freq: Once | INTRAMUSCULAR | Status: DC

## 2016-05-21 MED ORDER — SODIUM CHLORIDE 0.9 % IV SOLN
Freq: Once | INTRAVENOUS | Status: AC
  Administered 2016-05-21: 15:00:00 via INTRAVENOUS

## 2016-05-21 MED ORDER — PALONOSETRON HCL INJECTION 0.25 MG/5ML
INTRAVENOUS | Status: AC
  Filled 2016-05-21: qty 5

## 2016-05-21 MED ORDER — DEXAMETHASONE SODIUM PHOSPHATE 10 MG/ML IJ SOLN
10.0000 mg | Freq: Once | INTRAMUSCULAR | Status: AC
  Administered 2016-05-21: 10 mg via INTRAVENOUS

## 2016-05-21 MED ORDER — SODIUM CHLORIDE 0.9% FLUSH
10.0000 mL | Freq: Once | INTRAVENOUS | Status: AC
  Administered 2016-05-21: 10 mL
  Filled 2016-05-21: qty 10

## 2016-05-21 MED ORDER — DEXAMETHASONE 4 MG PO TABS: ORAL_TABLET | ORAL | 9 refills | Status: DC

## 2016-05-21 MED ORDER — PALONOSETRON HCL INJECTION 0.25 MG/5ML
0.2500 mg | Freq: Once | INTRAVENOUS | Status: AC
  Administered 2016-05-21: 0.25 mg via INTRAVENOUS

## 2016-05-21 MED FILL — DEXAMETHASONE 4 MG TABLET: 4 | 28 days supply | Qty: 20 | Fill #0

## 2016-05-21 NOTE — Patient Instructions (Signed)
[facility] Discharge Instructions for Patients Receiving Chemotherapy  Today you received the following chemotherapy agents: Cytoxan and Kyprolis   To help prevent nausea and vomiting after your treatment, we encourage you to take your nausea medication as directed.    If you develop nausea and vomiting that is not controlled by your nausea medication, call the clinic.   BELOW ARE SYMPTOMS THAT SHOULD BE REPORTED IMMEDIATELY:  *FEVER GREATER THAN 100.5 F  *CHILLS WITH OR WITHOUT FEVER  NAUSEA AND VOMITING THAT IS NOT CONTROLLED WITH YOUR NAUSEA MEDICATION  *UNUSUAL SHORTNESS OF BREATH  *UNUSUAL BRUISING OR BLEEDING  TENDERNESS IN MOUTH AND THROAT WITH OR WITHOUT PRESENCE OF ULCERS  *URINARY PROBLEMS  *BOWEL PROBLEMS  UNUSUAL RASH Items with * indicate a potential emergency and should be followed up as soon as possible.  Feel free to call the clinic you have any questions or concerns. The clinic phone number is [phone].  Please show the [facility] at check-in to the Emergency Department and triage nurse.  Cyclophosphamide injection (Cytoxan) What is this medicine? CYCLOPHOSPHAMIDE (sye kloe FOSS fa mide) is a chemotherapy drug. It slows the growth of cancer cells. This medicine is used to treat many types of cancer like lymphoma, myeloma, leukemia, breast cancer, and ovarian cancer, to name a few. This medicine may be used for other purposes; ask your health care provider or pharmacist if you have questions. COMMON BRAND NAME(S): Cytoxan, Neosar What should I tell my health care provider before I take this medicine? They need to know if you have any of these conditions: -blood disorders -history of other chemotherapy -infection -kidney disease -liver disease -recent or ongoing radiation therapy -tumors in the bone marrow -an unusual or allergic reaction to cyclophosphamide, other chemotherapy, other medicines, foods, dyes, or  preservatives -pregnant or trying to get pregnant -breast-feeding How should I use this medicine? This drug is usually given as an injection into a vein or muscle or by infusion into a vein. It is administered in a hospital or clinic by a specially trained health care professional. Talk to your pediatrician regarding the use of this medicine in children. Special care may be needed. Overdosage: If you think you have taken too much of this medicine contact a poison control center or emergency room at once. NOTE: This medicine is only for you. Do not share this medicine with others. What if I miss a dose? It is important not to miss your dose. Call your doctor or health care professional if you are unable to keep an appointment. What may interact with this medicine? This medicine may interact with the following medications: -amiodarone -amphotericin B -azathioprine -certain antiviral medicines for HIV or AIDS such as protease inhibitors (e.g., indinavir, ritonavir) and zidovudine -certain blood pressure medications such as benazepril, captopril, enalapril, fosinopril, lisinopril, moexipril, monopril, perindopril, quinapril, ramipril, trandolapril -certain cancer medications such as anthracyclines (e.g., daunorubicin, doxorubicin), busulfan, cytarabine, paclitaxel, pentostatin, tamoxifen, trastuzumab -certain diuretics such as chlorothiazide, chlorthalidone, hydrochlorothiazide, indapamide, metolazone -certain medicines that treat or prevent blood clots like warfarin -certain muscle relaxants such as succinylcholine -cyclosporine -etanercept -indomethacin -medicines to increase blood counts like filgrastim, pegfilgrastim, sargramostim -medicines used as general anesthesia -metronidazole -natalizumab This list may not describe all possible interactions. Give your health care provider a list of all the medicines, herbs, non-prescription drugs, or dietary supplements you use. Also tell them if  you smoke, drink alcohol, or use illegal drugs. Some items may interact with your medicine. What should  I watch for while using this medicine? Visit your doctor for checks on your progress. This drug may make you feel generally unwell. This is not uncommon, as chemotherapy can affect healthy cells as well as cancer cells. Report any side effects. Continue your course of treatment even though you feel ill unless your doctor tells you to stop. Drink water or other fluids as directed. Urinate often, even at night. In some cases, you may be given additional medicines to help with side effects. Follow all directions for their use. Call your doctor or health care professional for advice if you get a fever, chills or sore throat, or other symptoms of a cold or flu. Do not treat yourself. This drug decreases your body's ability to fight infections. Try to avoid being around people who are sick. This medicine may increase your risk to bruise or bleed. Call your doctor or health care professional if you notice any unusual bleeding. Be careful brushing and flossing your teeth or using a toothpick because you may get an infection or bleed more easily. If you have any dental work done, tell your dentist you are receiving this medicine. You may get drowsy or dizzy. Do not drive, use machinery, or do anything that needs mental alertness until you know how this medicine affects you. Do not become pregnant while taking this medicine or for 1 year after stopping it. Women should inform their doctor if they wish to become pregnant or think they might be pregnant. Men should not father a child while taking this medicine and for 4 months after stopping it. There is a potential for serious side effects to an unborn child. Talk to your health care professional or pharmacist for more information. Do not breast-feed an infant while taking this medicine. This medicine may interfere with the ability to have a child. This medicine  has caused ovarian failure in some women. This medicine has caused reduced sperm counts in some men. You should talk with your doctor or health care professional if you are concerned about your fertility. If you are going to have surgery, tell your doctor or health care professional that you have taken this medicine. What side effects may I notice from receiving this medicine? Side effects that you should report to your doctor or health care professional as soon as possible: -allergic reactions like skin rash, itching or hives, swelling of the face, lips, or tongue -low blood counts - this medicine may decrease the number of white blood cells, red blood cells and platelets. You may be at increased risk for infections and bleeding. -signs of infection - fever or chills, cough, sore throat, pain or difficulty passing urine -signs of decreased platelets or bleeding - bruising, pinpoint red spots on the skin, black, tarry stools, blood in the urine -signs of decreased red blood cells - unusually weak or tired, fainting spells, lightheadedness -breathing problems -dark urine -dizziness -palpitations -swelling of the ankles, feet, hands -trouble passing urine or change in the amount of urine -weight gain -yellowing of the eyes or skin Side effects that usually do not require medical attention (report to your doctor or health care professional if they continue or are bothersome): -changes in nail or skin color -hair loss -missed menstrual periods -mouth sores -nausea, vomiting This list may not describe all possible side effects. Call your doctor for medical advice about side effects. You may report side effects to FDA at 1-800-FDA-1088. Where should I keep my medicine? This drug is given in  a hospital or clinic and will not be stored at home. NOTE: This sheet is a summary. It may not cover all possible information. If you have questions about this medicine, talk to your doctor, pharmacist, or  health care provider.  2018 Elsevier/Gold Standard (2011-11-29 16:22:58)  Carfilzomib injection (Kyprolis) What is this medicine? CARFILZOMIB (kar FILZ oh mib) targets a specific protein within cancer cells and stops the cancer cells from growing. It is used to treat multiple myeloma. This medicine may be used for other purposes; ask your health care provider or pharmacist if you have questions. COMMON BRAND NAME(S): KYPROLIS What should I tell my health care provider before I take this medicine? They need to know if you have any of these conditions: -heart disease -history of blood clots -irregular heartbeat -kidney disease -liver disease -lung or breathing disease -an unusual or allergic reaction to carfilzomib, or other medicines, foods, dyes, or preservatives -pregnant or trying to get pregnant -breast-feeding How should I use this medicine? This medicine is for injection or infusion into a vein. It is given by a health care professional in a hospital or clinic setting. Talk to your pediatrician regarding the use of this medicine in children. Special care may be needed. Overdosage: If you think you have taken too much of this medicine contact a poison control center or emergency room at once. NOTE: This medicine is only for you. Do not share this medicine with others. What if I miss a dose? It is important not to miss your dose. Call your doctor or health care professional if you are unable to keep an appointment. What may interact with this medicine? Interactions are not expected. Give your health care provider a list of all the medicines, herbs, non-prescription drugs, or dietary supplements you use. Also tell them if you smoke, drink alcohol, or use illegal drugs. Some items may interact with your medicine. This list may not describe all possible interactions. Give your health care provider a list of all the medicines, herbs, non-prescription drugs, or dietary supplements you  use. Also tell them if you smoke, drink alcohol, or use illegal drugs. Some items may interact with your medicine. What should I watch for while using this medicine? Your condition will be monitored carefully while you are receiving this medicine. Report any side effects. Continue your course of treatment even though you feel ill unless your doctor tells you to stop. You may need blood work done while you are taking this medicine. Do not become pregnant while taking this medicine or for at least 30 days after stopping it. Women should inform their doctor if they wish to become pregnant or think they might be pregnant. There is a potential for serious side effects to an unborn child. Men should not father a child while taking this medicine and for 90 days after stopping it. Talk to your health care professional or pharmacist for more information. Do not breast-feed an infant while taking this medicine. Check with your doctor or health care professional if you get an attack of severe diarrhea, nausea and vomiting, or if you sweat a lot. The loss of too much body fluid can make it dangerous for you to take this medicine. You may get dizzy. Do not drive, use machinery, or do anything that needs mental alertness until you know how this medicine affects you. Do not stand or sit up quickly, especially if you are an older patient. This reduces the risk of dizzy or fainting spells. What side  effects may I notice from receiving this medicine? Side effects that you should report to your doctor or health care professional as soon as possible: -allergic reactions like skin rash, itching or hives, swelling of the face, lips, or tongue -confusion -dizziness -feeling faint or lightheaded -fever or chills -palpitations -seizures -signs and symptoms of bleeding such as bloody or black, tarry stools; red or dark-brown urine; spitting up blood or brown material that looks like coffee grounds; red spots on the skin;  unusual bruising or bleeding including from the eye, gums, or nose -signs and symptoms of a blood clot such as breathing problems; changes in vision; chest pain; severe, sudden headache; pain, swelling, warmth in the leg; trouble speaking; sudden numbness or weakness of the face, arm or leg -signs and symptoms of kidney injury like trouble passing urine or change in the amount of urine -signs and symptoms of liver injury like dark yellow or brown urine; general ill feeling or flu-like symptoms; light-colored stools; loss of appetite; nausea; right upper belly pain; unusually weak or tired; yellowing of the eyes or skin Side effects that usually do not require medical attention (report to your doctor or health care professional if they continue or are bothersome): -back pain -cough -diarrhea -headache -muscle cramps -vomiting This list may not describe all possible side effects. Call your doctor for medical advice about side effects. You may report side effects to FDA at 1-800-FDA-1088. Where should I keep my medicine? This drug is given in a hospital or clinic and will not be stored at home. NOTE: This sheet is a summary. It may not cover all possible information. If you have questions about this medicine, talk to your doctor, pharmacist, or health care provider.  2018 Elsevier/Gold Standard (2015-02-16 13:39:23)

## 2016-05-21 NOTE — Progress Notes (Signed)
Patient tolerated treatment well. Discharge instructions reviewed with patient, patient verbalized understanding. Patient stable upon discharge.

## 2016-05-22 ENCOUNTER — Telehealth: Payer: Self-pay

## 2016-05-22 ENCOUNTER — Ambulatory Visit (HOSPITAL_BASED_OUTPATIENT_CLINIC_OR_DEPARTMENT_OTHER): Payer: Medicaid Other

## 2016-05-22 VITALS — BP 150/85 | HR 62 | Temp 98.2°F | Resp 18

## 2016-05-22 DIAGNOSIS — Z5112 Encounter for antineoplastic immunotherapy: Secondary | ICD-10-CM | POA: Diagnosis not present

## 2016-05-22 DIAGNOSIS — C9000 Multiple myeloma not having achieved remission: Secondary | ICD-10-CM

## 2016-05-22 LAB — KAPPA/LAMBDA LIGHT CHAINS
IG KAPPA FREE LIGHT CHAIN: 13.2 mg/L (ref 3.3–19.4)
IG LAMBDA FREE LIGHT CHAIN: 119.3 mg/L — AB (ref 5.7–26.3)
KAPPA/LAMBDA FLC RATIO: 0.11 — AB (ref 0.26–1.65)

## 2016-05-22 LAB — TROPONIN I: Troponin I: <0.01 ng/mL (ref 0.00–0.04)

## 2016-05-22 LAB — BRAIN NATRIURETIC PEPTIDE: BNP: 12.6 pg/mL (ref 0.0–100.0)

## 2016-05-22 MED ORDER — CARFILZOMIB CHEMO INJECTION 60 MG
20.0000 mg/m2 | Freq: Once | INTRAVENOUS | Status: AC
  Administered 2016-05-22: 44 mg via INTRAVENOUS
  Filled 2016-05-22: qty 22

## 2016-05-22 MED ORDER — DEXAMETHASONE SODIUM PHOSPHATE 10 MG/ML IJ SOLN
10.0000 mg | Freq: Once | INTRAMUSCULAR | Status: AC
  Administered 2016-05-22: 10 mg via INTRAVENOUS

## 2016-05-22 MED ORDER — HEPARIN SOD (PORK) LOCK FLUSH 100 UNIT/ML IV SOLN
500.0000 [IU] | Freq: Once | INTRAVENOUS | Status: AC | PRN
  Administered 2016-05-22: 500 [IU]
  Filled 2016-05-22: qty 5

## 2016-05-22 MED ORDER — SODIUM CHLORIDE 0.9 % IV SOLN: Freq: Once | INTRAVENOUS | Status: AC

## 2016-05-22 MED ORDER — DEXAMETHASONE SODIUM PHOSPHATE 10 MG/ML IJ SOLN
INTRAMUSCULAR | Status: AC
  Filled 2016-05-22: qty 1

## 2016-05-22 MED ORDER — SODIUM CHLORIDE 0.9% FLUSH
10.0000 mL | INTRAVENOUS | Status: DC | PRN
  Administered 2016-05-22: 10 mL
  Filled 2016-05-22: qty 10

## 2016-05-22 MED ORDER — SODIUM CHLORIDE 0.9 % IV SOLN
Freq: Once | INTRAVENOUS | Status: AC
  Administered 2016-05-22: 12:00:00 via INTRAVENOUS

## 2016-05-22 NOTE — Telephone Encounter (Signed)
Called to see how patient was doing after treatment yesterday. States that he has a slight headache today and he has taken hydrocodone and he feels better. Complaining of heavy sensation in legs but that he has that at times. Instructed to call for any problems.

## 2016-05-22 NOTE — Telephone Encounter (Signed)
-----   Message from Beatriz Chancellor, RN sent at 05/21/2016  6:15 PM EDT ----- Regarding: Dr. Harlon Ditty Follow Up Call Patient of Dr. Alvy Bimler first time Kyprolis and Cytoxan, patient tolerated well.

## 2016-05-22 NOTE — Patient Instructions (Signed)
[facility] Discharge Instructions for Patients Receiving Chemotherapy  Today you received the following chemotherapy agents:Kyprolis   To help prevent nausea and vomiting after your treatment, we encourage you to take your nausea medication as directed.    If you develop nausea and vomiting that is not controlled by your nausea medication, call the clinic.   BELOW ARE SYMPTOMS THAT SHOULD BE REPORTED IMMEDIATELY:  *FEVER GREATER THAN 100.5 F  *CHILLS WITH OR WITHOUT FEVER  NAUSEA AND VOMITING THAT IS NOT CONTROLLED WITH YOUR NAUSEA MEDICATION  *UNUSUAL SHORTNESS OF BREATH  *UNUSUAL BRUISING OR BLEEDING  TENDERNESS IN MOUTH AND THROAT WITH OR WITHOUT PRESENCE OF ULCERS  *URINARY PROBLEMS  *BOWEL PROBLEMS  UNUSUAL RASH Items with * indicate a potential emergency and should be followed up as soon as possible.  Feel free to call the clinic you have any questions or concerns. The clinic phone number is [phone].  Please show the [place] at check-in to the Emergency Department and triage nurse.  Carfilzomib injection (Kyprolis) What is this medicine? CARFILZOMIB (kar FILZ oh mib) targets a specific protein within cancer cells and stops the cancer cells from growing. It is used to treat multiple myeloma. This medicine may be used for other purposes; ask your health care provider or pharmacist if you have questions. COMMON BRAND NAME(S): KYPROLIS What should I tell my health care provider before I take this medicine? They need to know if you have any of these conditions: -heart disease -history of blood clots -irregular heartbeat -kidney disease -liver disease -lung or breathing disease -an unusual or allergic reaction to carfilzomib, or other medicines, foods, dyes, or preservatives -pregnant or trying to get pregnant -breast-feeding How should I use this medicine? This medicine is for injection or infusion into a vein. It is given by a  health care professional in a hospital or clinic setting. Talk to your pediatrician regarding the use of this medicine in children. Special care may be needed. Overdosage: If you think you have taken too much of this medicine contact a poison control center or emergency room at once. NOTE: This medicine is only for you. Do not share this medicine with others. What if I miss a dose? It is important not to miss your dose. Call your doctor or health care professional if you are unable to keep an appointment. What may interact with this medicine? Interactions are not expected. Give your health care provider a list of all the medicines, herbs, non-prescription drugs, or dietary supplements you use. Also tell them if you smoke, drink alcohol, or use illegal drugs. Some items may interact with your medicine. This list may not describe all possible interactions. Give your health care provider a list of all the medicines, herbs, non-prescription drugs, or dietary supplements you use. Also tell them if you smoke, drink alcohol, or use illegal drugs. Some items may interact with your medicine. What should I watch for while using this medicine? Your condition will be monitored carefully while you are receiving this medicine. Report any side effects. Continue your course of treatment even though you feel ill unless your doctor tells you to stop. You may need blood work done while you are taking this medicine. Do not become pregnant while taking this medicine or for at least 30 days after stopping it. Women should inform their doctor if they wish to become pregnant or think they might be pregnant. There is a potential for serious side effects to an unborn  child. Men should not father a child while taking this medicine and for 90 days after stopping it. Talk to your health care professional or pharmacist for more information. Do not breast-feed an infant while taking this medicine. Check with your doctor or health  care professional if you get an attack of severe diarrhea, nausea and vomiting, or if you sweat a lot. The loss of too much body fluid can make it dangerous for you to take this medicine. You may get dizzy. Do not drive, use machinery, or do anything that needs mental alertness until you know how this medicine affects you. Do not stand or sit up quickly, especially if you are an older patient. This reduces the risk of dizzy or fainting spells. What side effects may I notice from receiving this medicine? Side effects that you should report to your doctor or health care professional as soon as possible: -allergic reactions like skin rash, itching or hives, swelling of the face, lips, or tongue -confusion -dizziness -feeling faint or lightheaded -fever or chills -palpitations -seizures -signs and symptoms of bleeding such as bloody or black, tarry stools; red or dark-brown urine; spitting up blood or brown material that looks like coffee grounds; red spots on the skin; unusual bruising or bleeding including from the eye, gums, or nose -signs and symptoms of a blood clot such as breathing problems; changes in vision; chest pain; severe, sudden headache; pain, swelling, warmth in the leg; trouble speaking; sudden numbness or weakness of the face, arm or leg -signs and symptoms of kidney injury like trouble passing urine or change in the amount of urine -signs and symptoms of liver injury like dark yellow or brown urine; general ill feeling or flu-like symptoms; light-colored stools; loss of appetite; nausea; right upper belly pain; unusually weak or tired; yellowing of the eyes or skin Side effects that usually do not require medical attention (report to your doctor or health care professional if they continue or are bothersome): -back pain -cough -diarrhea -headache -muscle cramps -vomiting This list may not describe all possible side effects. Call your doctor for medical advice about side effects.  You may report side effects to FDA at 1-800-FDA-1088. Where should I keep my medicine? This drug is given in a hospital or clinic and will not be stored at home. NOTE: This sheet is a summary. It may not cover all possible information. If you have questions about this medicine, talk to your doctor, pharmacist, or health care provider.  2018 Elsevier/Gold Standard (2015-02-16 13:39:23)

## 2016-05-27 LAB — MULTIPLE MYELOMA PANEL, SERUM
ALBUMIN/GLOB SERPL: 1.6 (ref 0.7–1.7)
ALPHA2 GLOB SERPL ELPH-MCNC: 0.6 g/dL (ref 0.4–1.0)
Albumin SerPl Elph-Mcnc: 3.9 g/dL (ref 2.9–4.4)
Alpha 1: 0.2 g/dL (ref 0.0–0.4)
B-GLOBULIN SERPL ELPH-MCNC: 0.9 g/dL (ref 0.7–1.3)
GAMMA GLOB SERPL ELPH-MCNC: 0.9 g/dL (ref 0.4–1.8)
GLOBULIN, TOTAL: 2.6 g/dL (ref 2.2–3.9)
IgA, Qn, Serum: 95 mg/dL (ref 90–386)
IgG, Qn, Serum: 1003 mg/dL (ref 700–1600)
IgM, Qn, Serum: 36 mg/dL (ref 20–172)
TOTAL PROTEIN: 6.5 g/dL (ref 6.0–8.5)

## 2016-05-28 ENCOUNTER — Ambulatory Visit (HOSPITAL_BASED_OUTPATIENT_CLINIC_OR_DEPARTMENT_OTHER): Payer: Medicaid Other | Admitting: Hematology and Oncology

## 2016-05-28 ENCOUNTER — Ambulatory Visit: Payer: Medicaid Other

## 2016-05-28 ENCOUNTER — Ambulatory Visit (HOSPITAL_BASED_OUTPATIENT_CLINIC_OR_DEPARTMENT_OTHER): Payer: Medicaid Other

## 2016-05-28 ENCOUNTER — Ambulatory Visit: Payer: Self-pay

## 2016-05-28 ENCOUNTER — Other Ambulatory Visit: Payer: Self-pay | Admitting: Medical Oncology

## 2016-05-28 ENCOUNTER — Telehealth: Payer: Self-pay | Admitting: Hematology and Oncology

## 2016-05-28 DIAGNOSIS — Z5111 Encounter for antineoplastic chemotherapy: Secondary | ICD-10-CM

## 2016-05-28 DIAGNOSIS — G620 Drug-induced polyneuropathy: Secondary | ICD-10-CM

## 2016-05-28 DIAGNOSIS — Z5112 Encounter for antineoplastic immunotherapy: Secondary | ICD-10-CM

## 2016-05-28 DIAGNOSIS — C9000 Multiple myeloma not having achieved remission: Secondary | ICD-10-CM

## 2016-05-28 DIAGNOSIS — T451X5A Adverse effect of antineoplastic and immunosuppressive drugs, initial encounter: Secondary | ICD-10-CM

## 2016-05-28 DIAGNOSIS — G893 Neoplasm related pain (acute) (chronic): Secondary | ICD-10-CM | POA: Diagnosis not present

## 2016-05-28 LAB — COMPREHENSIVE METABOLIC PANEL
ALBUMIN: 3.7 g/dL (ref 3.5–5.0)
ALT: 23 U/L (ref 0–55)
AST: 14 U/L (ref 5–34)
Alkaline Phosphatase: 44 U/L (ref 40–150)
Anion Gap: 7 mEq/L (ref 3–11)
BILIRUBIN TOTAL: 0.58 mg/dL (ref 0.20–1.20)
BUN: 29.7 mg/dL — AB (ref 7.0–26.0)
CALCIUM: 9.3 mg/dL (ref 8.4–10.4)
CHLORIDE: 112 meq/L — AB (ref 98–109)
CO2: 25 mEq/L (ref 22–29)
CREATININE: 1.1 mg/dL (ref 0.7–1.3)
EGFR: 81 mL/min/{1.73_m2} — ABNORMAL LOW (ref >90)
Glucose: 141 mg/dl — ABNORMAL HIGH (ref 70–140)
Potassium: 3.7 mEq/L (ref 3.5–5.1)
Sodium: 143 mEq/L (ref 136–145)
TOTAL PROTEIN: 6.2 g/dL — AB (ref 6.4–8.3)

## 2016-05-28 LAB — CBC WITH DIFFERENTIAL/PLATELET
BASO%: 0.3 % (ref 0.0–2.0)
Basophils Absolute: 0 10*3/uL (ref 0.0–0.1)
EOS%: 1.1 % (ref 0.0–7.0)
Eosinophils Absolute: 0.1 10*3/uL (ref 0.0–0.5)
HEMATOCRIT: 40.9 % (ref 38.4–49.9)
HEMOGLOBIN: 13.7 g/dL (ref 13.0–17.1)
LYMPH#: 2.3 10*3/uL (ref 0.9–3.3)
LYMPH%: 31.8 % (ref 14.0–49.0)
MCH: 30.5 pg (ref 27.2–33.4)
MCHC: 33.5 g/dL (ref 32.0–36.0)
MCV: 90.9 fL (ref 79.3–98.0)
MONO#: 0.6 10*3/uL (ref 0.1–0.9)
MONO%: 9 % (ref 0.0–14.0)
NEUT%: 57.8 % (ref 39.0–75.0)
NEUTROS ABS: 4.1 10*3/uL (ref 1.5–6.5)
PLATELETS: 174 10*3/uL (ref 140–400)
RBC: 4.5 10*6/uL (ref 4.20–5.82)
RDW: 15.4 % — AB (ref 11.0–14.6)
WBC: 7.2 10*3/uL (ref 4.0–10.3)

## 2016-05-28 MED ORDER — DEXAMETHASONE SODIUM PHOSPHATE 10 MG/ML IJ SOLN
INTRAMUSCULAR | Status: AC
  Filled 2016-05-28: qty 1

## 2016-05-28 MED ORDER — SODIUM CHLORIDE 0.9 % IV SOLN
300.0000 mg/m2 | Freq: Once | INTRAVENOUS | Status: AC
  Administered 2016-05-28: 700 mg via INTRAVENOUS
  Filled 2016-05-28: qty 35

## 2016-05-28 MED ORDER — HYDROCODONE-ACETAMINOPHEN 5-325 MG PO TABS: 1.0000 | ORAL_TABLET | Freq: Three times a day (TID) | ORAL | 0 refills | Status: DC | PRN

## 2016-05-28 MED ORDER — DEXTROSE 5 % IV SOLN
27.0000 mg/m2 | Freq: Once | INTRAVENOUS | Status: AC
  Administered 2016-05-28: 60 mg via INTRAVENOUS
  Filled 2016-05-28: qty 30

## 2016-05-28 MED ORDER — DEXAMETHASONE SODIUM PHOSPHATE 10 MG/ML IJ SOLN
10.0000 mg | Freq: Once | INTRAMUSCULAR | Status: AC
Start: 2016-05-28 — End: 2016-05-28
  Administered 2016-05-28: 10 mg via INTRAVENOUS

## 2016-05-28 MED ORDER — HEPARIN SOD (PORK) LOCK FLUSH 100 UNIT/ML IV SOLN
500.0000 [IU] | Freq: Once | INTRAVENOUS | Status: AC | PRN
  Administered 2016-05-28: 500 [IU]
  Filled 2016-05-28: qty 5

## 2016-05-28 MED ORDER — PALONOSETRON HCL INJECTION 0.25 MG/5ML
0.2500 mg | Freq: Once | INTRAVENOUS | Status: AC
  Administered 2016-05-28: 0.25 mg via INTRAVENOUS

## 2016-05-28 MED ORDER — PALONOSETRON HCL INJECTION 0.25 MG/5ML
INTRAVENOUS | Status: AC
  Filled 2016-05-28: qty 5

## 2016-05-28 MED ORDER — SODIUM CHLORIDE 0.9% FLUSH
10.0000 mL | INTRAVENOUS | Status: DC | PRN
  Administered 2016-05-28: 10 mL
  Filled 2016-05-28: qty 10

## 2016-05-28 MED ORDER — SODIUM CHLORIDE 0.9 % IV SOLN
Freq: Once | INTRAVENOUS | Status: AC
  Administered 2016-05-28: 13:00:00 via INTRAVENOUS

## 2016-05-28 NOTE — Telephone Encounter (Signed)
Scheduled appt per 5/1 los. Patient called back to treatment before scheduling finished - patient will pick up new schedule in treatment area. 5/29 f/u not scheduled due to availability of MD - sent message to MD

## 2016-05-28 NOTE — Patient Instructions (Signed)
[facility] Discharge Instructions for Patients Receiving Chemotherapy  Today you received the following chemotherapy agents Kyprolis and cytoxan To help prevent nausea and vomiting after your treatment, we encourage you to take your nausea medication as prescribwed  If you develop nausea and vomiting that is not controlled by your nausea medication, call the clinic.   BELOW ARE SYMPTOMS THAT SHOULD BE REPORTED IMMEDIATELY:  *FEVER GREATER THAN 100.5 F  *CHILLS WITH OR WITHOUT FEVER  NAUSEA AND VOMITING THAT IS NOT CONTROLLED WITH YOUR NAUSEA MEDICATION  *UNUSUAL SHORTNESS OF BREATH  *UNUSUAL BRUISING OR BLEEDING  TENDERNESS IN MOUTH AND THROAT WITH OR WITHOUT PRESENCE OF ULCERS  *URINARY PROBLEMS  *BOWEL PROBLEMS  UNUSUAL RASH Items with * indicate a potential emergency and should be followed up as soon as possible.  Feel free to call the clinic you have any questions or concerns. The clinic phone number is [phone].  Please show the [facility] at check-in to the Emergency Department and triage nurse.

## 2016-05-29 ENCOUNTER — Ambulatory Visit (HOSPITAL_BASED_OUTPATIENT_CLINIC_OR_DEPARTMENT_OTHER): Payer: Medicaid Other

## 2016-05-29 VITALS — BP 137/90 | HR 75 | Temp 98.0°F | Resp 18

## 2016-05-29 DIAGNOSIS — Z5112 Encounter for antineoplastic immunotherapy: Secondary | ICD-10-CM

## 2016-05-29 DIAGNOSIS — C9000 Multiple myeloma not having achieved remission: Secondary | ICD-10-CM | POA: Diagnosis not present

## 2016-05-29 MED ORDER — SODIUM CHLORIDE 0.9 % IV SOLN
Freq: Once | INTRAVENOUS | Status: AC
Start: 2016-05-29 — End: 2016-05-29
  Administered 2016-05-29: 11:00:00 via INTRAVENOUS

## 2016-05-29 MED ORDER — DEXAMETHASONE SODIUM PHOSPHATE 10 MG/ML IJ SOLN
10.0000 mg | Freq: Once | INTRAMUSCULAR | Status: AC
  Administered 2016-05-29: 10 mg via INTRAVENOUS

## 2016-05-29 MED ORDER — DEXAMETHASONE SODIUM PHOSPHATE 10 MG/ML IJ SOLN
INTRAMUSCULAR | Status: AC
  Filled 2016-05-29: qty 1

## 2016-05-29 MED ORDER — SODIUM CHLORIDE 0.9 % IV SOLN
Freq: Once | INTRAVENOUS | Status: AC
  Administered 2016-05-29: 10:00:00 via INTRAVENOUS

## 2016-05-29 MED ORDER — SODIUM CHLORIDE 0.9% FLUSH
10.0000 mL | INTRAVENOUS | Status: DC | PRN
  Administered 2016-05-29: 10 mL
  Filled 2016-05-29: qty 10

## 2016-05-29 MED ORDER — HEPARIN SOD (PORK) LOCK FLUSH 100 UNIT/ML IV SOLN
500.0000 [IU] | Freq: Once | INTRAVENOUS | Status: AC | PRN
  Administered 2016-05-29: 500 [IU]
  Filled 2016-05-29: qty 5

## 2016-05-29 MED ORDER — CARFILZOMIB CHEMO INJECTION 60 MG
27.0000 mg/m2 | Freq: Once | INTRAVENOUS | Status: AC
  Administered 2016-05-29: 60 mg via INTRAVENOUS
  Filled 2016-05-29: qty 30

## 2016-05-29 NOTE — Patient Instructions (Signed)
[facility] Discharge Instructions for Patients Receiving Chemotherapy  Today you received the following chemotherapy agents: Kyprolis. To help prevent nausea and vomiting after your treatment, we encourage you to take your nausea medication as prescribwed  If you develop nausea and vomiting that is not controlled by your nausea medication, call the clinic.   BELOW ARE SYMPTOMS THAT SHOULD BE REPORTED IMMEDIATELY:  *FEVER GREATER THAN 100.5 F  *CHILLS WITH OR WITHOUT FEVER  NAUSEA AND VOMITING THAT IS NOT CONTROLLED WITH YOUR NAUSEA MEDICATION  *UNUSUAL SHORTNESS OF BREATH  *UNUSUAL BRUISING OR BLEEDING  TENDERNESS IN MOUTH AND THROAT WITH OR WITHOUT PRESENCE OF ULCERS  *URINARY PROBLEMS  *BOWEL PROBLEMS  UNUSUAL RASH Items with * indicate a potential emergency and should be followed up as soon as possible.  Feel free to call the clinic you have any questions or concerns. The clinic phone number is [phone].  Please show the [place] [facility] at check-in to the Emergency Department and triage nurse.

## 2016-05-30 ENCOUNTER — Encounter: Payer: Self-pay | Admitting: Hematology and Oncology

## 2016-05-30 ENCOUNTER — Ambulatory Visit: Payer: Self-pay

## 2016-05-30 MED FILL — ACYCLOVIR 400 MG TABLET: 400 | 30 days supply | Qty: 60 | Fill #2

## 2016-05-30 NOTE — Assessment & Plan Note (Signed)
This is stable He will continue pain medication as prescribed

## 2016-05-30 NOTE — Assessment & Plan Note (Addendum)
He tolerated treatment well except for excessive fatigue He has gained some weight which I think is related to steroid related side effects We will proceed with treatment without dose adjustment I plan to repeat myeloma panel once a month He will continue calcium with vitamin D and acyclovir for antiviral prophylaxis We will continue Zometa every 3 months, next due in June 2018

## 2016-05-30 NOTE — Progress Notes (Signed)
[facility] OFFICE PROGRESS NOTE  Patient Care Team: [name], MD as PCP - General (Internal Medicine)  SUMMARY OF ONCOLOGIC HISTORY:   Multiple myeloma not having achieved remission ([place])   06/23/2015 - 06/28/2015 Hospital Admission    The patient was admitted to the hospital due to gait ataxia and back pain. He was subsequently found to have cord compression underwent surgery and was discharged home      06/24/2015 Imaging    Abnormal appearance of the T6 vertebral body, highly suspicious for possible osseous metastasis. Associated pathologic fracture withup to 30% height loss. There is associated abnormal soft tissue density within the ventral epidural space,      06/24/2015 Imaging    MRI lumbar: Focal osseous lesion with abnormal enhancement involving the right pedicle of L3, suspicious for possible osseous metastasisgiven the findings in the thoracic spine. Question additional focal lesion within the right iliac wing as above.        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 Zometa is not given due to inability to get dental clearance      12/14/2015 - 04/24/2016 Chemotherapy    He is started on maintenance treatment with Revlimid only      12/18/2015 Imaging    MRI thoracic and lumbar spine showed numerous enhancing foci throughout the thoracic and lumbar spine with several new small foci in the lumbar spine in comparison with prior MRI compatible with metastatic disease. Stable loss of height of the T3, T4, and T6 vertebral bodies and new postsurgical changes  related to T6 laminectomy. No significant epidural disease or evidence for cord compression. No abnormal enhancement of the spinal cord or cauda equina.      05/02/2016 Bone Marrow Biopsy    Outside bone marrow biopsy showed 20% myeloma involvement      05/16/2016 Procedure    Successful placement of a right internal jugular approach power injectable Port-A-Cath. The catheter is ready for immediate use.      05/21/2016 -  Chemotherapy    He received Kyprolis, Cytoxan and dexamethasone        INTERVAL HISTORY: Please see below for problem oriented charting. He returns for further follow-up He denies worsening neuropathy from treatment He has  significant fatigue with recent treatment Denies mouth sores, nausea or vomiting His back pain is a little worse recently, stable on current prescription pain medicine Denies recent infection  REVIEW OF SYSTEMS:   Constitutional: Denies fevers, chills or abnormal weight loss Eyes: Denies blurriness of vision Ears, nose, mouth, throat, and face: Denies mucositis or sore throat Respiratory: Denies cough, dyspnea or wheezes Cardiovascular: Denies palpitation, chest discomfort or lower extremity swelling Gastrointestinal:  Denies nausea, heartburn or change in bowel habits Skin: Denies abnormal skin rashes Lymphatics: Denies new lymphadenopathy or easy bruising Neurological:Denies numbness, tingling or new weaknesses Behavioral/Psych: Mood is stable, no new changes  All other systems were reviewed with the patient and are negative.  I have reviewed the past medical history, past surgical history, social history and family history with the  patient and they are unchanged from previous note.  ALLERGIES:  has No Known Allergies.  MEDICATIONS:  Current Outpatient Prescriptions  Medication Sig Dispense Refill  . acyclovir (ZOVIRAX) 400 MG tablet Take 1 tablet (400 mg total) by mouth 2 (two) times daily. 60 tablet 11  . calcium carbonate (TUMS)  500 MG chewable tablet Chew 1 tablet (200 mg of elemental calcium total) by mouth 2 (two) times daily. 60 tablet 9  . cholecalciferol (VITAMIN D) 1000 units tablet Take 1,000 Units by mouth daily.    Marland Kitchen dexamethasone (DECADRON) 4 MG tablet 20 mg weekly to take by mouth on Sundays 20 tablet 9  . gabapentin (NEURONTIN) 300 MG capsule Take 2 capsules (600 mg total) by mouth 3 (three) times daily. (Patient taking differently: Take 600 mg by mouth at bedtime as needed (nerve pain). ) 90 capsule 6  . lidocaine-prilocaine (EMLA) cream Apply to affected area once 30 g 3  . senna-docusate (SENOKOT-S) 8.6-50 MG tablet Take 1 tablet by mouth at bedtime as needed for mild constipation. 30 tablet 0  . HYDROcodone-acetaminophen (NORCO/VICODIN) 5-325 MG tablet Take 1 tablet by mouth 3 (three) times daily as needed for moderate pain. 90 tablet 0  . ondansetron (ZOFRAN) 8 MG tablet Take 1 tablet (8 mg total) by mouth every 8 (eight) hours as needed for refractory nausea / vomiting. (Patient not taking: Reported on 05/28/2016) 30 tablet 1  . prochlorperazine (COMPAZINE) 10 MG tablet Take 1 tablet (10 mg total) by mouth every 6 (six) hours as needed (Nausea or vomiting). (Patient not taking: Reported on 05/28/2016) 30 tablet 1   No current facility-administered medications for this visit.     PHYSICAL EXAMINATION: ECOG PERFORMANCE STATUS: 1 - Symptomatic but completely ambulatory  Vitals:   05/28/16 1056  BP: 115/72  Pulse: 92  Resp: 18  Temp: 98.1 F (36.7 C)   Filed Weights   05/28/16 1056  Weight: 226 lb 9.6 oz (102.8 kg)    GENERAL:alert, no distress and comfortable SKIN: skin color, texture, turgor are normal, no rashes or significant lesions EYES: normal, Conjunctiva are pink and non-injected, sclera clear OROPHARYNX:no exudate, no erythema and lips, buccal mucosa, and tongue normal  NECK: supple, thyroid normal size, non-tender, without nodularity LYMPH:  no palpable lymphadenopathy in the cervical,  axillary or inguinal LUNGS: clear to auscultation and percussion with normal breathing effort HEART: regular rate & rhythm and no murmurs and no lower extremity edema ABDOMEN:abdomen soft, non-tender and normal bowel sounds Musculoskeletal:no cyanosis of digits and no clubbing  NEURO: alert & oriented x 3 with fluent speech, no focal motor/sensory deficits  LABORATORY DATA:  I have reviewed the data as listed    Component Value Date/Time   NA 143 05/28/2016 1208   K 3.7 05/28/2016 1208   CL 110 07/20/2015 0755   CO2 25 05/28/2016 1208   GLUCOSE 141 (H) 05/28/2016 1208   BUN 29.7 (H) 05/28/2016 1208   CREATININE 1.1 05/28/2016 1208   CALCIUM 9.3 05/28/2016 1208   PROT 6.2 (L) 05/28/2016 1208   ALBUMIN 3.7 05/28/2016 1208   AST 14 05/28/2016 1208   ALT 23 05/28/2016 1208   ALKPHOS 44 05/28/2016 1208   BILITOT 0.58 05/28/2016 1208   GFRNONAA 49 (L) 07/20/2015 0755   GFRAA 56 (L) 07/20/2015 0755    No results found for: SPEP, UPEP  Lab Results  Component Value Date   WBC 7.2 05/28/2016   NEUTROABS 4.1 05/28/2016   HGB 13.7 05/28/2016   HCT  40.9 05/28/2016   MCV 90.9 05/28/2016   PLT 174 05/28/2016      Chemistry      Component Value Date/Time   NA 143 05/28/2016 1208   K 3.7 05/28/2016 1208   CL 110 07/20/2015 0755   CO2 25 05/28/2016 1208   BUN 29.7 (H) 05/28/2016 1208   CREATININE 1.1 05/28/2016 1208      Component Value Date/Time   CALCIUM 9.3 05/28/2016 1208   ALKPHOS 44 05/28/2016 1208   AST 14 05/28/2016 1208   ALT 23 05/28/2016 1208   BILITOT 0.58 05/28/2016 1208       RADIOGRAPHIC STUDIES: I have personally reviewed the radiological images as listed and agreed with the findings in the report. Ir US Guide Vasc Access Right  Result Date: 05/16/2016 INDICATION: Myeloma EXAM: IMPLANTED PORT A CATH PLACEMENT WITH ULTRASOUND AND FLUOROSCOPIC GUIDANCE MEDICATIONS: Ancef; The antibiotic was administered within an appropriate time interval prior to skin  puncture. ANESTHESIA/SEDATION: Moderate (conscious) sedation was employed during this procedure. A total of Versed 4 mg and Fentanyl 100 mcg was administered intravenously. Moderate Sedation Time: 30 minutes. The patient's level of consciousness and vital signs were monitored continuously by radiology nursing throughout the procedure under my direct supervision. FLUOROSCOPY TIME:  minutes, 42 seconds (4 mGy) COMPLICATIONS: None immediate. PROCEDURE: The procedure, risks, benefits, and alternatives were explained to the patient. Questions regarding the procedure were encouraged and answered. The patient understands and consents to the procedure. The right neck and chest were prepped with chlorhexidine in a sterile fashion, and a sterile drape was applied covering the operative field. Maximum barrier sterile technique with sterile gowns and gloves were used for the procedure. A timeout was performed prior to the initiation of the procedure. Local anesthesia was provided with 1% lidocaine with epinephrine. After creating a small venotomy incision, a micropuncture kit was utilized to access the internal jugular vein under direct, real-time ultrasound guidance. Ultrasound image documentation was performed. The microwire was kinked to measure appropriate catheter length. A subcutaneous port pocket was then created along the upper chest wall utilizing a combination of sharp and blunt dissection. The pocket was irrigated with sterile saline. A single lumen ISP power injectable port was chosen for placement. The 8 Fr catheter was tunneled from the port pocket site to the venotomy incision. The port was placed in the pocket. It was sewn in place with 2 2-0 Ethilon stitches. The external catheter was trimmed to appropriate length. At the venotomy, an 8 Fr peel-away sheath was placed over a guidewire under fluoroscopic guidance. The catheter was then placed through the sheath and the sheath was removed. Final catheter  positioning was confirmed and documented with a fluoroscopic spot radiograph. The port was accessed with a Huber needle, aspirated and flushed with heparinized saline. The venotomy site was closed with an interrupted 4-0 Vicryl suture. The port pocket incision was closed with interrupted 2-0 Vicryl suture and the skin was opposed with a running subcuticular 4-0 Vicryl suture. Dermabond and Steri-strips were applied to both incisions. Dressings were placed. The patient tolerated the procedure well without immediate post procedural complication. IMPRESSION: Successful placement of a right internal jugular approach power injectable Port-A-Cath. The catheter is ready for immediate use. Electronically Signed   By: Marybelle Killings M.D.   On: 05/16/2016 10:46   Ir Fluoro Guide Port Insertion Right  Result Date: 05/16/2016 INDICATION: Myeloma EXAM: IMPLANTED PORT A CATH PLACEMENT WITH ULTRASOUND AND FLUOROSCOPIC GUIDANCE MEDICATIONS: Ancef; The antibiotic was administered within  an appropriate time interval prior to skin puncture. ANESTHESIA/SEDATION: Moderate (conscious) sedation was employed during this procedure. A total of Versed 4 mg and Fentanyl 100 mcg was administered intravenously. Moderate Sedation Time: 30 minutes. The patient's level of consciousness and vital signs were monitored continuously by radiology nursing throughout the procedure under my direct supervision. FLUOROSCOPY TIME:  minutes, 42 seconds (4 mGy) COMPLICATIONS: None immediate. PROCEDURE: The procedure, risks, benefits, and alternatives were explained to the patient. Questions regarding the procedure were encouraged and answered. The patient understands and consents to the procedure. The right neck and chest were prepped with chlorhexidine in a sterile fashion, and a sterile drape was applied covering the operative field. Maximum barrier sterile technique with sterile gowns and gloves were used for the procedure. A timeout was performed prior to  the initiation of the procedure. Local anesthesia was provided with 1% lidocaine with epinephrine. After creating a small venotomy incision, a micropuncture kit was utilized to access the internal jugular vein under direct, real-time ultrasound guidance. Ultrasound image documentation was performed. The microwire was kinked to measure appropriate catheter length. A subcutaneous port pocket was then created along the upper chest wall utilizing a combination of sharp and blunt dissection. The pocket was irrigated with sterile saline. A single lumen ISP power injectable port was chosen for placement. The 8 Fr catheter was tunneled from the port pocket site to the venotomy incision. The port was placed in the pocket. It was sewn in place with 2 2-0 Ethilon stitches. The external catheter was trimmed to appropriate length. At the venotomy, an 8 Fr peel-away sheath was placed over a guidewire under fluoroscopic guidance. The catheter was then placed through the sheath and the sheath was removed. Final catheter positioning was confirmed and documented with a fluoroscopic spot radiograph. The port was accessed with a Huber needle, aspirated and flushed with heparinized saline. The venotomy site was closed with an interrupted 4-0 Vicryl suture. The port pocket incision was closed with interrupted 2-0 Vicryl suture and the skin was opposed with a running subcuticular 4-0 Vicryl suture. Dermabond and Steri-strips were applied to both incisions. Dressings were placed. The patient tolerated the procedure well without immediate post procedural complication. IMPRESSION: Successful placement of a right internal jugular approach power injectable Port-A-Cath. The catheter is ready for immediate use. Electronically Signed   By: Marybelle Killings M.D.   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  All questions were answered. The patient knows to call the clinic with any problems, questions or concerns. No barriers to learning was detected. I spent 15 minutes counseling the patient face to face. The total time spent in the appointment was 20 minutes and more than 50% was  on counseling and review of test results     [name], MD 05/30/2016 6:08 AM

## 2016-05-30 NOTE — Assessment & Plan Note (Signed)
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 Continue the same dose for now 

## 2016-06-04 ENCOUNTER — Ambulatory Visit: Payer: Medicaid Other

## 2016-06-04 ENCOUNTER — Ambulatory Visit (HOSPITAL_BASED_OUTPATIENT_CLINIC_OR_DEPARTMENT_OTHER): Payer: Medicaid Other

## 2016-06-04 ENCOUNTER — Other Ambulatory Visit (HOSPITAL_BASED_OUTPATIENT_CLINIC_OR_DEPARTMENT_OTHER): Payer: Medicaid Other

## 2016-06-04 VITALS — BP 136/87 | Temp 98.4°F | Resp 18

## 2016-06-04 DIAGNOSIS — Z5112 Encounter for antineoplastic immunotherapy: Secondary | ICD-10-CM | POA: Diagnosis not present

## 2016-06-04 DIAGNOSIS — Z5111 Encounter for antineoplastic chemotherapy: Secondary | ICD-10-CM | POA: Diagnosis not present

## 2016-06-04 DIAGNOSIS — C9000 Multiple myeloma not having achieved remission: Secondary | ICD-10-CM

## 2016-06-04 LAB — COMPREHENSIVE METABOLIC PANEL
ALT: 44 U/L (ref 0–55)
ANION GAP: 8 meq/L (ref 3–11)
AST: 19 U/L (ref 5–34)
Albumin: 3.7 g/dL (ref 3.5–5.0)
Alkaline Phosphatase: 46 U/L (ref 40–150)
BUN: 23.6 mg/dL (ref 7.0–26.0)
CALCIUM: 9.8 mg/dL (ref 8.4–10.4)
CHLORIDE: 106 meq/L (ref 98–109)
CO2: 27 meq/L (ref 22–29)
Creatinine: 0.9 mg/dL (ref 0.7–1.3)
EGFR: >90 mL/min/{1.73_m2} (ref >90)
Glucose: 105 mg/dl (ref 70–140)
POTASSIUM: 4.1 meq/L (ref 3.5–5.1)
Sodium: 142 mEq/L (ref 136–145)
Total Bilirubin: 0.92 mg/dL (ref 0.20–1.20)
Total Protein: 6.4 g/dL (ref 6.4–8.3)

## 2016-06-04 LAB — CBC WITH DIFFERENTIAL/PLATELET
BASO%: 0.6 % (ref 0.0–2.0)
BASOS ABS: 0 10*3/uL (ref 0.0–0.1)
EOS%: 1.9 % (ref 0.0–7.0)
Eosinophils Absolute: 0.1 10*3/uL (ref 0.0–0.5)
HEMATOCRIT: 39.8 % (ref 38.4–49.9)
HGB: 13.6 g/dL (ref 13.0–17.1)
LYMPH#: 1.7 10*3/uL (ref 0.9–3.3)
LYMPH%: 39.5 % (ref 14.0–49.0)
MCH: 30.5 pg (ref 27.2–33.4)
MCHC: 34.1 g/dL (ref 32.0–36.0)
MCV: 89.5 fL (ref 79.3–98.0)
MONO#: 0.5 10*3/uL (ref 0.1–0.9)
MONO%: 10.8 % (ref 0.0–14.0)
NEUT#: 2.1 10*3/uL (ref 1.5–6.5)
NEUT%: 47.2 % (ref 39.0–75.0)
PLATELETS: 167 10*3/uL (ref 140–400)
RBC: 4.45 10*6/uL (ref 4.20–5.82)
RDW: 15.2 % — ABNORMAL HIGH (ref 11.0–14.6)
WBC: 4.4 10*3/uL (ref 4.0–10.3)

## 2016-06-04 MED ORDER — SODIUM CHLORIDE 0.9 % IV SOLN
Freq: Once | INTRAVENOUS | Status: AC
  Administered 2016-06-04: 13:00:00 via INTRAVENOUS

## 2016-06-04 MED ORDER — PALONOSETRON HCL INJECTION 0.25 MG/5ML
0.2500 mg | Freq: Once | INTRAVENOUS | Status: AC
  Administered 2016-06-04: 0.25 mg via INTRAVENOUS

## 2016-06-04 MED ORDER — DEXAMETHASONE SODIUM PHOSPHATE 10 MG/ML IJ SOLN
INTRAMUSCULAR | Status: AC
  Filled 2016-06-04: qty 1

## 2016-06-04 MED ORDER — SODIUM CHLORIDE 0.9 % IV SOLN
300.0000 mg/m2 | Freq: Once | INTRAVENOUS | Status: AC
  Administered 2016-06-04: 700 mg via INTRAVENOUS
  Filled 2016-06-04: qty 35

## 2016-06-04 MED ORDER — SODIUM CHLORIDE 0.9% FLUSH
10.0000 mL | INTRAVENOUS | Status: DC | PRN
  Administered 2016-06-04: 10 mL
  Filled 2016-06-04: qty 10

## 2016-06-04 MED ORDER — CARFILZOMIB CHEMO INJECTION 60 MG
27.0000 mg/m2 | Freq: Once | INTRAVENOUS | Status: AC
  Administered 2016-06-04: 60 mg via INTRAVENOUS
  Filled 2016-06-04: qty 30

## 2016-06-04 MED ORDER — DEXAMETHASONE SODIUM PHOSPHATE 10 MG/ML IJ SOLN
10.0000 mg | Freq: Once | INTRAMUSCULAR | Status: AC
  Administered 2016-06-04: 10 mg via INTRAVENOUS

## 2016-06-04 MED ORDER — HEPARIN SOD (PORK) LOCK FLUSH 100 UNIT/ML IV SOLN
500.0000 [IU] | Freq: Once | INTRAVENOUS | Status: AC | PRN
  Administered 2016-06-04: 500 [IU]
  Filled 2016-06-04: qty 5

## 2016-06-04 MED ORDER — PALONOSETRON HCL INJECTION 0.25 MG/5ML
INTRAVENOUS | Status: AC
  Filled 2016-06-04: qty 5

## 2016-06-04 NOTE — Patient Instructions (Addendum)
[facility] Discharge Instructions for Patients Receiving Chemotherapy  Today you received the following chemotherapy agents: Kyprolis, Cytoxan. To help prevent nausea and vomiting after your treatment, we encourage you to take your nausea medication as prescribwed  If you develop nausea and vomiting that is not controlled by your nausea medication, call the clinic.   BELOW ARE SYMPTOMS THAT SHOULD BE REPORTED IMMEDIATELY:  *FEVER GREATER THAN 100.5 F  *CHILLS WITH OR WITHOUT FEVER  NAUSEA AND VOMITING THAT IS NOT CONTROLLED WITH YOUR NAUSEA MEDICATION  *UNUSUAL SHORTNESS OF BREATH  *UNUSUAL BRUISING OR BLEEDING  TENDERNESS IN MOUTH AND THROAT WITH OR WITHOUT PRESENCE OF ULCERS  *URINARY PROBLEMS  *BOWEL PROBLEMS  UNUSUAL RASH Items with * indicate a potential emergency and should be followed up as soon as possible.  Feel free to call the clinic you have any questions or concerns. The clinic phone number is [phone].  Please show the [place] at check-in to the Emergency Department and triage nurse.

## 2016-06-04 NOTE — Progress Notes (Signed)
Patient complained of worsening neuropathy in feet and also worsening fatigue. Notified MD and instructed to remind patient to follow medication regimen with gabapentin. MD previously dose-reduced treatment. OK to proceed with treatment today.  Wylene Simmer, BSN, RN 06/04/2016 12:28 PM

## 2016-06-04 NOTE — Patient Instructions (Signed)

## 2016-06-05 ENCOUNTER — Ambulatory Visit (HOSPITAL_BASED_OUTPATIENT_CLINIC_OR_DEPARTMENT_OTHER): Payer: Medicaid Other

## 2016-06-05 VITALS — BP 143/81 | HR 78 | Temp 98.1°F | Resp 16

## 2016-06-05 DIAGNOSIS — Z5112 Encounter for antineoplastic immunotherapy: Secondary | ICD-10-CM

## 2016-06-05 DIAGNOSIS — C9000 Multiple myeloma not having achieved remission: Secondary | ICD-10-CM | POA: Diagnosis not present

## 2016-06-05 MED ORDER — SODIUM CHLORIDE 0.9 % IV SOLN
Freq: Once | INTRAVENOUS | Status: AC
  Administered 2016-06-05: 11:00:00 via INTRAVENOUS

## 2016-06-05 MED ORDER — DEXAMETHASONE SODIUM PHOSPHATE 10 MG/ML IJ SOLN
10.0000 mg | Freq: Once | INTRAMUSCULAR | Status: AC
  Administered 2016-06-05: 10 mg via INTRAVENOUS

## 2016-06-05 MED ORDER — ANTICOAGULANT SODIUM CITRATE 4% (200MG/5ML) IV SOLN
5.0000 mL | Freq: Once | Status: AC
  Administered 2016-06-05: 5 mL
  Filled 2016-06-05: qty 5

## 2016-06-05 MED ORDER — ALTEPLASE 2 MG IJ SOLR
2.0000 mg | Freq: Once | INTRAMUSCULAR | Status: DC | PRN
  Filled 2016-06-05: qty 2

## 2016-06-05 MED ORDER — SODIUM CHLORIDE 0.9 % IV SOLN: Freq: Once | INTRAVENOUS | Status: DC

## 2016-06-05 MED ORDER — HEPARIN SOD (PORK) LOCK FLUSH 100 UNIT/ML IV SOLN
250.0000 [IU] | Freq: Once | INTRAVENOUS | Status: DC | PRN
  Filled 2016-06-05: qty 5

## 2016-06-05 MED ORDER — SODIUM CHLORIDE 0.9% FLUSH
10.0000 mL | INTRAVENOUS | Status: DC | PRN
  Administered 2016-06-05: 10 mL
  Filled 2016-06-05: qty 10

## 2016-06-05 MED ORDER — DEXTROSE 5 % IV SOLN
27.0000 mg/m2 | Freq: Once | INTRAVENOUS | Status: AC
  Administered 2016-06-05: 60 mg via INTRAVENOUS
  Filled 2016-06-05: qty 30

## 2016-06-05 MED ORDER — SODIUM CHLORIDE 0.9% FLUSH
3.0000 mL | Freq: Once | INTRAVENOUS | Status: DC | PRN
  Filled 2016-06-05: qty 10

## 2016-06-05 MED ORDER — HEPARIN SOD (PORK) LOCK FLUSH 100 UNIT/ML IV SOLN
500.0000 [IU] | Freq: Once | INTRAVENOUS | Status: DC | PRN
Start: 2016-06-05 — End: 2016-06-05
  Filled 2016-06-05: qty 5

## 2016-06-05 MED ORDER — DEXAMETHASONE SODIUM PHOSPHATE 10 MG/ML IJ SOLN
INTRAMUSCULAR | Status: AC
  Filled 2016-06-05: qty 1

## 2016-06-05 NOTE — Patient Instructions (Signed)
[facility] Discharge Instructions for Patients Receiving Chemotherapy  Today you received the following chemotherapy agents: Kyprolis   To help prevent nausea and vomiting after your treatment, we encourage you to take your nausea medication as directed.    If you develop nausea and vomiting that is not controlled by your nausea medication, call the clinic.   BELOW ARE SYMPTOMS THAT SHOULD BE REPORTED IMMEDIATELY:  *FEVER GREATER THAN 100.5 F  *CHILLS WITH OR WITHOUT FEVER  NAUSEA AND VOMITING THAT IS NOT CONTROLLED WITH YOUR NAUSEA MEDICATION  *UNUSUAL SHORTNESS OF BREATH  *UNUSUAL BRUISING OR BLEEDING  TENDERNESS IN MOUTH AND THROAT WITH OR WITHOUT PRESENCE OF ULCERS  *URINARY PROBLEMS  *BOWEL PROBLEMS  UNUSUAL RASH Items with * indicate a potential emergency and should be followed up as soon as possible.  Feel free to call the clinic you have any questions or concerns. The clinic phone number is [phone].  Please show the CHEMO ALERT CARD at check-in to the Emergency Department and triage nurse.   

## 2016-06-17 ENCOUNTER — Other Ambulatory Visit: Payer: Self-pay | Admitting: Hematology and Oncology

## 2016-06-18 ENCOUNTER — Other Ambulatory Visit (HOSPITAL_BASED_OUTPATIENT_CLINIC_OR_DEPARTMENT_OTHER): Payer: Medicaid Other

## 2016-06-18 ENCOUNTER — Ambulatory Visit (HOSPITAL_BASED_OUTPATIENT_CLINIC_OR_DEPARTMENT_OTHER): Payer: Medicaid Other

## 2016-06-18 VITALS — BP 142/76 | HR 74 | Temp 98.4°F | Resp 18

## 2016-06-18 DIAGNOSIS — C9000 Multiple myeloma not having achieved remission: Secondary | ICD-10-CM | POA: Diagnosis not present

## 2016-06-18 DIAGNOSIS — Z5112 Encounter for antineoplastic immunotherapy: Secondary | ICD-10-CM

## 2016-06-18 DIAGNOSIS — Z5111 Encounter for antineoplastic chemotherapy: Secondary | ICD-10-CM | POA: Diagnosis not present

## 2016-06-18 LAB — CBC WITH DIFFERENTIAL/PLATELET
BASO%: 0.6 % (ref 0.0–2.0)
Basophils Absolute: 0 10*3/uL (ref 0.0–0.1)
EOS%: 2.8 % (ref 0.0–7.0)
Eosinophils Absolute: 0.1 10*3/uL (ref 0.0–0.5)
HEMATOCRIT: 39.1 % (ref 38.4–49.9)
HEMOGLOBIN: 13.3 g/dL (ref 13.0–17.1)
LYMPH#: 1.2 10*3/uL (ref 0.9–3.3)
LYMPH%: 32.4 % (ref 14.0–49.0)
MCH: 30.5 pg (ref 27.2–33.4)
MCHC: 34 g/dL (ref 32.0–36.0)
MCV: 89.7 fL (ref 79.3–98.0)
MONO#: 0.3 10*3/uL (ref 0.1–0.9)
MONO%: 8 % (ref 0.0–14.0)
NEUT#: 2 10*3/uL (ref 1.5–6.5)
NEUT%: 56.2 % (ref 39.0–75.0)
PLATELETS: 215 10*3/uL (ref 140–400)
RBC: 4.36 10*6/uL (ref 4.20–5.82)
RDW: 13.9 % (ref 11.0–14.6)
WBC: 3.6 10*3/uL — ABNORMAL LOW (ref 4.0–10.3)

## 2016-06-18 LAB — COMPREHENSIVE METABOLIC PANEL
ALT: 23 U/L (ref 0–55)
AST: 18 U/L (ref 5–34)
Albumin: 3.9 g/dL (ref 3.5–5.0)
Alkaline Phosphatase: 43 U/L (ref 40–150)
Anion Gap: 8 mEq/L (ref 3–11)
BILIRUBIN TOTAL: 0.82 mg/dL (ref 0.20–1.20)
BUN: 19.3 mg/dL (ref 7.0–26.0)
CALCIUM: 9 mg/dL (ref 8.4–10.4)
CO2: 24 mEq/L (ref 22–29)
CREATININE: 1.1 mg/dL (ref 0.7–1.3)
Chloride: 111 mEq/L — ABNORMAL HIGH (ref 98–109)
EGFR: 78 mL/min/{1.73_m2} — ABNORMAL LOW (ref >90)
Glucose: 96 mg/dl (ref 70–140)
Potassium: 4 mEq/L (ref 3.5–5.1)
Sodium: 144 mEq/L (ref 136–145)
TOTAL PROTEIN: 6.4 g/dL (ref 6.4–8.3)

## 2016-06-18 MED ORDER — DEXAMETHASONE SODIUM PHOSPHATE 10 MG/ML IJ SOLN
10.0000 mg | Freq: Once | INTRAMUSCULAR | Status: AC
  Administered 2016-06-18: 10 mg via INTRAVENOUS

## 2016-06-18 MED ORDER — CARFILZOMIB CHEMO INJECTION 60 MG
27.0000 mg/m2 | Freq: Once | INTRAVENOUS | Status: AC
  Administered 2016-06-18: 60 mg via INTRAVENOUS
  Filled 2016-06-18: qty 30

## 2016-06-18 MED ORDER — PALONOSETRON HCL INJECTION 0.25 MG/5ML
0.2500 mg | Freq: Once | INTRAVENOUS | Status: AC
  Administered 2016-06-18: 0.25 mg via INTRAVENOUS

## 2016-06-18 MED ORDER — PALONOSETRON HCL INJECTION 0.25 MG/5ML
INTRAVENOUS | Status: AC
  Filled 2016-06-18: qty 5

## 2016-06-18 MED ORDER — ANTICOAGULANT SODIUM CITRATE 4% (200MG/5ML) IV SOLN
5.0000 mL | Freq: Once | Status: AC
  Administered 2016-06-18: 5 mL via INTRAVENOUS
  Filled 2016-06-18: qty 5

## 2016-06-18 MED ORDER — SODIUM CHLORIDE 0.9 % IV SOLN
Freq: Once | INTRAVENOUS | Status: AC
  Administered 2016-06-18: 11:00:00 via INTRAVENOUS

## 2016-06-18 MED ORDER — SODIUM CHLORIDE 0.9% FLUSH
10.0000 mL | INTRAVENOUS | Status: DC | PRN
  Administered 2016-06-18 (×2): 10 mL
  Filled 2016-06-18: qty 10

## 2016-06-18 MED ORDER — DEXAMETHASONE SODIUM PHOSPHATE 10 MG/ML IJ SOLN
INTRAMUSCULAR | Status: AC
  Filled 2016-06-18: qty 1

## 2016-06-18 MED ORDER — HEPARIN SOD (PORK) LOCK FLUSH 100 UNIT/ML IV SOLN
500.0000 [IU] | Freq: Once | INTRAVENOUS | Status: DC | PRN
  Filled 2016-06-18: qty 5

## 2016-06-18 MED ORDER — SODIUM CHLORIDE 0.9 % IV SOLN
300.0000 mg/m2 | Freq: Once | INTRAVENOUS | Status: AC
  Administered 2016-06-18: 700 mg via INTRAVENOUS
  Filled 2016-06-18: qty 35

## 2016-06-18 NOTE — Patient Instructions (Signed)
[facility] Discharge Instructions for Patients Receiving Chemotherapy  Today you received the following chemotherapy agents :  Kyprolis,  Cytoxan.  To help prevent nausea and vomiting after your treatment, we encourage you to take your nausea medication as prescribed.   If you develop nausea and vomiting that is not controlled by your nausea medication, call the clinic.   BELOW ARE SYMPTOMS THAT SHOULD BE REPORTED IMMEDIATELY:  *FEVER GREATER THAN 100.5 F  *CHILLS WITH OR WITHOUT FEVER  NAUSEA AND VOMITING THAT IS NOT CONTROLLED WITH YOUR NAUSEA MEDICATION  *UNUSUAL SHORTNESS OF BREATH  *UNUSUAL BRUISING OR BLEEDING  TENDERNESS IN MOUTH AND THROAT WITH OR WITHOUT PRESENCE OF ULCERS  *URINARY PROBLEMS  *BOWEL PROBLEMS  UNUSUAL RASH Items with * indicate a potential emergency and should be followed up as soon as possible.  Feel free to call the clinic you have any questions or concerns. The clinic phone number is [phone].  Please show the CHEMO ALERT CARD at check-in to the Emergency Department and triage nurse.   

## 2016-06-19 ENCOUNTER — Ambulatory Visit (HOSPITAL_BASED_OUTPATIENT_CLINIC_OR_DEPARTMENT_OTHER): Payer: Medicaid Other

## 2016-06-19 VITALS — BP 135/82 | HR 74 | Temp 98.2°F | Resp 16

## 2016-06-19 DIAGNOSIS — Z5112 Encounter for antineoplastic immunotherapy: Secondary | ICD-10-CM

## 2016-06-19 DIAGNOSIS — C9000 Multiple myeloma not having achieved remission: Secondary | ICD-10-CM

## 2016-06-19 LAB — KAPPA/LAMBDA LIGHT CHAINS
IG KAPPA FREE LIGHT CHAIN: 7.4 mg/L (ref 3.3–19.4)
Ig Lambda Free Light Chain: 45.2 mg/L — ABNORMAL HIGH (ref 5.7–26.3)
Kappa/Lambda FluidC Ratio: 0.16 — ABNORMAL LOW (ref 0.26–1.65)

## 2016-06-19 MED ORDER — DEXAMETHASONE SODIUM PHOSPHATE 10 MG/ML IJ SOLN
10.0000 mg | Freq: Once | INTRAMUSCULAR | Status: AC
  Administered 2016-06-19: 10 mg via INTRAVENOUS

## 2016-06-19 MED ORDER — SODIUM CHLORIDE 0.9 % IV SOLN
Freq: Once | INTRAVENOUS | Status: AC
  Administered 2016-06-19: 12:00:00 via INTRAVENOUS

## 2016-06-19 MED ORDER — SODIUM CHLORIDE 0.9% FLUSH
10.0000 mL | INTRAVENOUS | Status: DC | PRN
  Administered 2016-06-19: 10 mL
  Filled 2016-06-19: qty 10

## 2016-06-19 MED ORDER — DEXTROSE 5 % IV SOLN
27.0000 mg/m2 | Freq: Once | INTRAVENOUS | Status: AC
  Administered 2016-06-19: 60 mg via INTRAVENOUS
  Filled 2016-06-19: qty 30

## 2016-06-19 MED ORDER — ANTICOAGULANT SODIUM CITRATE 4% (200MG/5ML) IV SOLN
5.0000 mL | Freq: Once | Status: AC
  Administered 2016-06-19: 5 mL via INTRAVENOUS
  Filled 2016-06-19: qty 5

## 2016-06-19 MED ORDER — DEXAMETHASONE SODIUM PHOSPHATE 10 MG/ML IJ SOLN
INTRAMUSCULAR | Status: AC
  Filled 2016-06-19: qty 1

## 2016-06-19 MED FILL — HYDROCODON-APAP 5-325: 5-325 | 30 days supply | Qty: 90 | Fill #0

## 2016-06-19 NOTE — Progress Notes (Signed)
Patient had nausea last night and this am.  Took compazine last night.  Nausea now resolved.  Reviewed antiemetics with patient.  Also patient reports that his constipation resolved this am.  Otherwise no other changes.

## 2016-06-20 LAB — MULTIPLE MYELOMA PANEL, SERUM
ALBUMIN SERPL ELPH-MCNC: 3.7 g/dL (ref 2.9–4.4)
ALBUMIN/GLOB SERPL: 1.6 (ref 0.7–1.7)
ALPHA 1: 0.2 g/dL (ref 0.0–0.4)
ALPHA2 GLOB SERPL ELPH-MCNC: 0.5 g/dL (ref 0.4–1.0)
B-Globulin SerPl Elph-Mcnc: 0.9 g/dL (ref 0.7–1.3)
Gamma Glob SerPl Elph-Mcnc: 0.7 g/dL (ref 0.4–1.8)
Globulin, Total: 2.4 g/dL (ref 2.2–3.9)
IGA/IMMUNOGLOBULIN A, SERUM: 64 mg/dL — AB (ref 90–386)
IGG (IMMUNOGLOBIN G), SERUM: 786 mg/dL (ref 700–1600)
IGM (IMMUNOGLOBIN M), SRM: 22 mg/dL (ref 20–172)
TOTAL PROTEIN: 6.1 g/dL (ref 6.0–8.5)

## 2016-06-25 ENCOUNTER — Ambulatory Visit (HOSPITAL_BASED_OUTPATIENT_CLINIC_OR_DEPARTMENT_OTHER): Payer: Medicaid Other

## 2016-06-25 ENCOUNTER — Encounter: Payer: Self-pay | Admitting: *Deleted

## 2016-06-25 ENCOUNTER — Other Ambulatory Visit (HOSPITAL_BASED_OUTPATIENT_CLINIC_OR_DEPARTMENT_OTHER): Payer: Medicaid Other

## 2016-06-25 ENCOUNTER — Ambulatory Visit: Payer: Medicaid Other

## 2016-06-25 ENCOUNTER — Encounter: Payer: Self-pay | Admitting: Hematology and Oncology

## 2016-06-25 ENCOUNTER — Ambulatory Visit (HOSPITAL_BASED_OUTPATIENT_CLINIC_OR_DEPARTMENT_OTHER): Payer: Medicaid Other | Admitting: Hematology and Oncology

## 2016-06-25 VITALS — BP 131/91 | HR 67 | Temp 98.6°F | Resp 18 | Ht 74.0 in | Wt 226.6 lb

## 2016-06-25 DIAGNOSIS — C9000 Multiple myeloma not having achieved remission: Secondary | ICD-10-CM | POA: Diagnosis not present

## 2016-06-25 DIAGNOSIS — T451X5A Adverse effect of antineoplastic and immunosuppressive drugs, initial encounter: Secondary | ICD-10-CM

## 2016-06-25 DIAGNOSIS — Z5111 Encounter for antineoplastic chemotherapy: Secondary | ICD-10-CM

## 2016-06-25 DIAGNOSIS — D701 Agranulocytosis secondary to cancer chemotherapy: Secondary | ICD-10-CM | POA: Diagnosis not present

## 2016-06-25 DIAGNOSIS — G893 Neoplasm related pain (acute) (chronic): Secondary | ICD-10-CM

## 2016-06-25 DIAGNOSIS — Z5112 Encounter for antineoplastic immunotherapy: Secondary | ICD-10-CM

## 2016-06-25 DIAGNOSIS — G620 Drug-induced polyneuropathy: Secondary | ICD-10-CM | POA: Diagnosis not present

## 2016-06-25 DIAGNOSIS — D702 Other drug-induced agranulocytosis: Secondary | ICD-10-CM

## 2016-06-25 LAB — CBC WITH DIFFERENTIAL/PLATELET
BASO%: 0.6 % (ref 0.0–2.0)
Basophils Absolute: 0 10*3/uL (ref 0.0–0.1)
EOS%: 1.6 % (ref 0.0–7.0)
Eosinophils Absolute: 0.1 10*3/uL (ref 0.0–0.5)
HCT: 38.3 % — ABNORMAL LOW (ref 38.4–49.9)
HGB: 13.1 g/dL (ref 13.0–17.1)
LYMPH%: 37.7 % (ref 14.0–49.0)
MCH: 31 pg (ref 27.2–33.4)
MCHC: 34.2 g/dL (ref 32.0–36.0)
MCV: 90.8 fL (ref 79.3–98.0)
MONO#: 0.5 10*3/uL (ref 0.1–0.9)
MONO%: 12.2 % (ref 0.0–14.0)
NEUT#: 1.8 10*3/uL (ref 1.5–6.5)
NEUT%: 47.9 % (ref 39.0–75.0)
Platelets: 161 10*3/uL (ref 140–400)
RBC: 4.22 10*6/uL (ref 4.20–5.82)
RDW: 14.8 % — ABNORMAL HIGH (ref 11.0–14.6)
WBC: 3.7 10*3/uL — ABNORMAL LOW (ref 4.0–10.3)
lymph#: 1.4 10*3/uL (ref 0.9–3.3)

## 2016-06-25 LAB — COMPREHENSIVE METABOLIC PANEL
ALT: 17 U/L (ref 0–55)
ANION GAP: 9 meq/L (ref 3–11)
AST: 14 U/L (ref 5–34)
Albumin: 3.9 g/dL (ref 3.5–5.0)
Alkaline Phosphatase: 36 U/L — ABNORMAL LOW (ref 40–150)
BUN: 19 mg/dL (ref 7.0–26.0)
CO2: 24 meq/L (ref 22–29)
CREATININE: 0.9 mg/dL (ref 0.7–1.3)
Calcium: 9.2 mg/dL (ref 8.4–10.4)
Chloride: 109 mEq/L (ref 98–109)
EGFR: >90 mL/min/{1.73_m2} (ref >90)
Glucose: 89 mg/dl (ref 70–140)
POTASSIUM: 3.9 meq/L (ref 3.5–5.1)
Sodium: 142 mEq/L (ref 136–145)
Total Bilirubin: 0.93 mg/dL (ref 0.20–1.20)
Total Protein: 6.2 g/dL — ABNORMAL LOW (ref 6.4–8.3)

## 2016-06-25 MED ORDER — ANTICOAGULANT SODIUM CITRATE 4% (200MG/5ML) IV SOLN
5.0000 mL | Freq: Once | Status: AC
  Administered 2016-06-25: 5 mL
  Filled 2016-06-25: qty 5

## 2016-06-25 MED ORDER — PALONOSETRON HCL INJECTION 0.25 MG/5ML
0.2500 mg | Freq: Once | INTRAVENOUS | Status: AC
  Administered 2016-06-25: 0.25 mg via INTRAVENOUS

## 2016-06-25 MED ORDER — PALONOSETRON HCL INJECTION 0.25 MG/5ML
INTRAVENOUS | Status: AC
  Filled 2016-06-25: qty 5

## 2016-06-25 MED ORDER — DEXAMETHASONE SODIUM PHOSPHATE 10 MG/ML IJ SOLN
INTRAMUSCULAR | Status: AC
  Filled 2016-06-25: qty 1

## 2016-06-25 MED ORDER — SODIUM CHLORIDE 0.9 % IV SOLN
Freq: Once | INTRAVENOUS | Status: AC
  Administered 2016-06-25: 15:00:00 via INTRAVENOUS

## 2016-06-25 MED ORDER — SODIUM CHLORIDE 0.9% FLUSH
10.0000 mL | INTRAVENOUS | Status: DC | PRN
  Administered 2016-06-25: 10 mL
  Filled 2016-06-25: qty 10

## 2016-06-25 MED ORDER — SODIUM CHLORIDE 0.9 % IV SOLN
300.0000 mg/m2 | Freq: Once | INTRAVENOUS | Status: AC
  Administered 2016-06-25: 700 mg via INTRAVENOUS
  Filled 2016-06-25: qty 35

## 2016-06-25 MED ORDER — DEXAMETHASONE SODIUM PHOSPHATE 10 MG/ML IJ SOLN
10.0000 mg | Freq: Once | INTRAMUSCULAR | Status: AC
Start: 2016-06-25 — End: 2016-06-25
  Administered 2016-06-25: 10 mg via INTRAVENOUS

## 2016-06-25 MED ORDER — HEPARIN SOD (PORK) LOCK FLUSH 100 UNIT/ML IV SOLN
500.0000 [IU] | Freq: Once | INTRAVENOUS | Status: DC | PRN
  Filled 2016-06-25: qty 5

## 2016-06-25 MED ORDER — DEXTROSE 5 % IV SOLN
27.0000 mg/m2 | Freq: Once | INTRAVENOUS | Status: AC
  Administered 2016-06-25: 60 mg via INTRAVENOUS
  Filled 2016-06-25: qty 30

## 2016-06-25 MED ORDER — ANTICOAGULANT SODIUM CITRATE 4% (200MG/5ML) IV SOLN
5.0000 mL | Freq: Once | Status: DC
  Filled 2016-06-25: qty 5

## 2016-06-25 NOTE — Assessment & Plan Note (Signed)
This is likely due to recent treatment. The patient denies recent history of fevers, cough, chills, diarrhea or dysuria. He is asymptomatic from the leukopenia. I will observe for now.  I will continue the chemotherapy at current dose without dosage adjustment.  If the leukopenia gets progressive worse in the future, I might have to delay his treatment or adjust the chemotherapy dose. 

## 2016-06-25 NOTE — Progress Notes (Signed)
QI encounter 

## 2016-06-25 NOTE — Assessment & Plan Note (Signed)
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 Continue the same dose for now 

## 2016-06-25 NOTE — Assessment & Plan Note (Signed)
This is stable He will continue pain medication as prescribed

## 2016-06-25 NOTE — Progress Notes (Signed)
pt stated he took his oral decadron on Sunday.

## 2016-06-25 NOTE — Patient Instructions (Signed)

## 2016-06-25 NOTE — Progress Notes (Signed)
[facility] OFFICE PROGRESS NOTE  Patient Care Team: [name], MD as PCP - General (Internal Medicine)  SUMMARY OF ONCOLOGIC HISTORY:   Multiple myeloma not having achieved remission [facility])   06/23/2015 - 06/28/2015 Hospital Admission    The patient was admitted to the hospital due to gait ataxia and back pain. He was subsequently found to have cord compression underwent surgery and was discharged home      06/24/2015 Imaging    Abnormal appearance of the T6 vertebral body, highly suspicious for possible osseous metastasis. Associated pathologic fracture withup to 30% height loss. There is associated abnormal soft tissue density within the ventral epidural space,      06/24/2015 Imaging    MRI lumbar: Focal osseous lesion with abnormal enhancement involving the right pedicle of L3, suspicious for possible osseous metastasisgiven the findings in the thoracic spine. Question additional focal lesion within the right iliac wing as above.        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 Zometa is not given due to inability to get dental clearance      12/14/2015 - 04/24/2016 Chemotherapy    He is started on maintenance treatment with Revlimid only      12/18/2015 Imaging    MRI thoracic and lumbar spine showed numerous enhancing foci throughout the thoracic and lumbar spine with several new small foci in the lumbar spine in comparison with prior MRI compatible with metastatic disease. Stable loss of height of the T3, T4, and T6 vertebral bodies and new postsurgical  changes related to T6 laminectomy. No significant epidural disease or evidence for cord compression. No abnormal enhancement of the spinal cord or cauda equina.      05/02/2016 Bone Marrow Biopsy    Outside bone marrow biopsy showed 20% myeloma involvement      05/16/2016 Procedure    Successful placement of a right internal jugular approach power injectable Port-A-Cath. The catheter is ready for immediate use.      05/21/2016 -  Chemotherapy    He received Kyprolis, Cytoxan and dexamethasone        INTERVAL HISTORY: Please see below for problem oriented charting. He returns today for cycle 2, day 8 of treatment He complained of excessive fatigue He denies worsening pain He takes pain medicine regularly He denies recent infection No recent nausea, mucositis or constipation. He denies worsening peripheral neuropathy.  REVIEW OF SYSTEMS:   Constitutional: Denies fevers, chills or abnormal weight loss Eyes: Denies blurriness of vision Ears, nose, mouth, throat, and face: Denies mucositis or sore throat Respiratory: Denies cough, dyspnea or wheezes Cardiovascular: Denies palpitation, chest discomfort or lower extremity swelling Gastrointestinal:  Denies nausea, heartburn or change in bowel habits Skin: Denies abnormal skin rashes Lymphatics: Denies new lymphadenopathy or easy bruising Neurological:Denies numbness, tingling or new weaknesses Behavioral/Psych: Mood is stable, no new changes  All other systems were reviewed with the patient and are negative.  I have reviewed the past medical history, past surgical history, social history and family history with the patient and they  are unchanged from previous note.  ALLERGIES:  has No Known Allergies.  MEDICATIONS:  Current Outpatient Prescriptions  Medication Sig Dispense Refill  . acyclovir (ZOVIRAX) 400 MG tablet Take 1 tablet (400 mg total) by mouth 2 (two) times daily. 60 tablet 11  . calcium carbonate (TUMS) 500 MG  chewable tablet Chew 1 tablet (200 mg of elemental calcium total) by mouth 2 (two) times daily. 60 tablet 9  . cholecalciferol (VITAMIN D) 1000 units tablet Take 1,000 Units by mouth daily.    Marland Kitchen dexamethasone (DECADRON) 4 MG tablet 20 mg weekly to take by mouth on Sundays 20 tablet 9  . gabapentin (NEURONTIN) 300 MG capsule Take 2 capsules (600 mg total) by mouth 3 (three) times daily. (Patient taking differently: Take 600 mg by mouth at bedtime as needed (nerve pain). ) 90 capsule 6  . HYDROcodone-acetaminophen (NORCO/VICODIN) 5-325 MG tablet Take 1 tablet by mouth 3 (three) times daily as needed for moderate pain. 90 tablet 0  . lidocaine-prilocaine (EMLA) cream Apply to affected area once 30 g 3  . ondansetron (ZOFRAN) 8 MG tablet Take 1 tablet (8 mg total) by mouth every 8 (eight) hours as needed for refractory nausea / vomiting. 30 tablet 1  . prochlorperazine (COMPAZINE) 10 MG tablet Take 1 tablet (10 mg total) by mouth every 6 (six) hours as needed (Nausea or vomiting). 30 tablet 1  . senna-docusate (SENOKOT-S) 8.6-50 MG tablet Take 1 tablet by mouth at bedtime as needed for mild constipation. (Patient not taking: Reported on 06/25/2016) 30 tablet 0   No current facility-administered medications for this visit.    Facility-Administered Medications Ordered in Other Visits  Medication Dose Route Frequency Provider Last Rate Last Dose  . heparin lock flush 100 unit/mL  500 Units Intracatheter Once PRN Alvy Bimler, Corissa Oguinn, MD      . sodium chloride flush (NS) 0.9 % injection 10 mL  10 mL Intracatheter PRN Alvy Bimler, Lizette Pazos, MD   10 mL at 06/25/16 1708    PHYSICAL EXAMINATION: ECOG PERFORMANCE STATUS: 1 - Symptomatic but completely ambulatory  Vitals:   06/25/16 1405  BP: (!) 131/91  Pulse: 67  Resp: 18  Temp: 98.6 F (37 C)   Filed Weights   06/25/16 1405  Weight: 226 lb 9.6 oz (102.8 kg)    GENERAL:alert, no distress and comfortable SKIN: skin color, texture, turgor are normal, no rashes or  significant lesions EYES: normal, Conjunctiva are pink and non-injected, sclera clear OROPHARYNX:no exudate, no erythema and lips, buccal mucosa, and tongue normal  NECK: supple, thyroid normal size, non-tender, without nodularity LYMPH:  no palpable lymphadenopathy in the cervical, axillary or inguinal LUNGS: clear to auscultation and percussion with normal breathing effort HEART: regular rate & rhythm and no murmurs and no lower extremity edema ABDOMEN:abdomen soft, non-tender and normal bowel sounds Musculoskeletal:no cyanosis of digits and no clubbing  NEURO: alert & oriented x 3 with fluent speech, no focal motor/sensory deficits  LABORATORY DATA:  I have reviewed the data as listed    Component Value Date/Time   NA 142 06/25/2016 1319   K 3.9 06/25/2016 1319   CL 110 07/20/2015 0755   CO2 24 06/25/2016 1319   GLUCOSE 89 06/25/2016 1319   BUN 19.0 06/25/2016 1319   CREATININE 0.9 06/25/2016 1319   CALCIUM 9.2 06/25/2016 1319   PROT 6.2 (L) 06/25/2016 1319   ALBUMIN 3.9 06/25/2016 1319   AST 14 06/25/2016 1319   ALT 17 06/25/2016 1319   ALKPHOS 36 (L) 06/25/2016  1319   BILITOT 0.93 06/25/2016 1319   GFRNONAA 49 (L) 07/20/2015 0755   GFRAA 56 (L) 07/20/2015 0755    No results found for: SPEP, UPEP  Lab Results  Component Value Date   WBC 3.7 (L) 06/25/2016   NEUTROABS 1.8 06/25/2016   HGB 13.1 06/25/2016   HCT 38.3 (L) 06/25/2016   MCV 90.8 06/25/2016   PLT 161 06/25/2016      Chemistry      Component Value Date/Time   NA 142 06/25/2016 1319   K 3.9 06/25/2016 1319   CL 110 07/20/2015 0755   CO2 24 06/25/2016 1319   BUN 19.0 06/25/2016 1319   CREATININE 0.9 06/25/2016 1319      Component Value Date/Time   CALCIUM 9.2 06/25/2016 1319   ALKPHOS 36 (L) 06/25/2016 1319   AST 14 06/25/2016 1319   ALT 17 06/25/2016 1319   BILITOT 0.93 06/25/2016 1319      ASSESSMENT & PLAN:  Multiple myeloma not having achieved remission ([place]) He tolerated treatment well  except for excessive fatigue We will proceed with treatment without dose adjustment Recent repeat myeloma panel showed great response to Rx I would recommend repeat myeloma panel again in 1 month The patient was under the impression he need repeat bone marrow biopsy soon I have contacted his transplant physician to verify this The patient does not want bone marrow biopsy to be done at [facility].  He prefers a sedated bone marrow and I will try to arrange this to be done after cycle 2 of treatment He will continue calcium with vitamin D and acyclovir for antiviral prophylaxis We will continue Zometa every 3 months, next due in June 2018  Drug-induced leukopenia ([place]) This is likely due to recent treatment. The patient denies recent history of fevers, cough, chills, diarrhea or dysuria. He is asymptomatic from the leukopenia. I will observe for now.  I will continue the chemotherapy at current dose without dosage adjustment.  If the leukopenia gets progressive worse in the future, I might have to delay his treatment or adjust the chemotherapy dose.    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 CT BIOPSY    Standing Status:   Future    Standing Expiration Date:   07/30/2017    Order Specific Question:   Reason for Exam (SYMPTOM  OR DIAGNOSIS REQUIRED)    Answer:   bone marrow biopsy, myeloma staging    Order Specific Question:   Preferred imaging location?    Answer:   [facility]  . CT BONE MARROW BIOPSY & ASPIRATION    Standing Status:   Future    Standing Expiration Date:   09/25/2017    Order Specific Question:   Reason for Exam (SYMPTOM  OR DIAGNOSIS  REQUIRED)    Answer:   bone marrow for myeloma staging    Order Specific Question:   Preferred imaging location?    Answer:   [facility]    Order Specific Question:   Radiology Contrast Protocol - do NOT remove file path    Answer:   \\[email or web address]   All questions were answered. The patient knows to call the clinic with  any problems, questions or concerns. No barriers to learning was detected. I spent 25 minutes counseling the patient face to face. The total time spent in the appointment was 30 minutes and more than 50% was on counseling and review of test results     [name], MD 06/25/2016 5:13 PM

## 2016-06-25 NOTE — Assessment & Plan Note (Signed)
He tolerated treatment well except for excessive fatigue We will proceed with treatment without dose adjustment Recent repeat myeloma panel showed great response to Rx I would recommend repeat myeloma panel again in 1 month The patient was under the impression he need repeat bone marrow biopsy soon I have contacted his transplant physician to verify this The patient does not want bone marrow biopsy to be done at [facility]  He prefers a sedated bone marrow and I will try to arrange this to be done after cycle 2 of treatment He will continue calcium with vitamin D and acyclovir for antiviral prophylaxis We will continue Zometa every 3 months, next due in June 2018

## 2016-06-25 NOTE — Patient Instructions (Addendum)
[facility] Discharge Instructions for Patients Receiving Chemotherapy  Today you received the following chemotherapy agents CYTOXAN and KYPROLIS To help prevent nausea and vomiting after your treatment, we encourage you to take your nausea medication as prescribed.  ondansetron (ZOFRAN) 8 MG tablet 30 tablet 1 05/10/2016    Sig - Route: Take 1 tablet (8 mg total) by mouth every 8 (eight) hours as needed for refractory nausea / vomiting. - Oral    prochlorperazine (COMPAZINE) 10 MG tablet 30 tablet 1 05/10/2016    Sig - Route: Take 1 tablet (10 mg total) by mouth every 6 (six) hours as needed (Nausea or vomiting). - Oral     If you develop nausea and vomiting that is not controlled by your nausea medication, call the clinic.   BELOW ARE SYMPTOMS THAT SHOULD BE REPORTED IMMEDIATELY:  *FEVER GREATER THAN 100.5 F  *CHILLS WITH OR WITHOUT FEVER  NAUSEA AND VOMITING THAT IS NOT CONTROLLED WITH YOUR NAUSEA MEDICATION  *UNUSUAL SHORTNESS OF BREATH  *UNUSUAL BRUISING OR BLEEDING  TENDERNESS IN MOUTH AND THROAT WITH OR WITHOUT PRESENCE OF ULCERS  *URINARY PROBLEMS  *BOWEL PROBLEMS  UNUSUAL RASH Items with * indicate a potential emergency and should be followed up as soon as possible.  Feel free to call the clinic you have any questions or concerns. The clinic phone number is [phone].  Please show the [place] at check-in to the Emergency Department and triage nurse.

## 2016-06-26 ENCOUNTER — Ambulatory Visit (HOSPITAL_BASED_OUTPATIENT_CLINIC_OR_DEPARTMENT_OTHER): Payer: Medicaid Other

## 2016-06-26 VITALS — BP 146/90 | HR 61 | Temp 98.1°F | Resp 16

## 2016-06-26 DIAGNOSIS — C9000 Multiple myeloma not having achieved remission: Secondary | ICD-10-CM | POA: Diagnosis not present

## 2016-06-26 DIAGNOSIS — Z5112 Encounter for antineoplastic immunotherapy: Secondary | ICD-10-CM | POA: Diagnosis not present

## 2016-06-26 MED ORDER — SODIUM CHLORIDE 0.9% FLUSH
10.0000 mL | INTRAVENOUS | Status: DC | PRN
  Administered 2016-06-26: 10 mL
  Filled 2016-06-26: qty 10

## 2016-06-26 MED ORDER — DEXAMETHASONE SODIUM PHOSPHATE 10 MG/ML IJ SOLN
INTRAMUSCULAR | Status: AC
  Filled 2016-06-26: qty 1

## 2016-06-26 MED ORDER — SODIUM CHLORIDE 0.9 % IV SOLN
Freq: Once | INTRAVENOUS | Status: AC
  Administered 2016-06-26: 11:00:00 via INTRAVENOUS

## 2016-06-26 MED ORDER — DEXAMETHASONE SODIUM PHOSPHATE 10 MG/ML IJ SOLN
10.0000 mg | Freq: Once | INTRAMUSCULAR | Status: AC
  Administered 2016-06-26: 10 mg via INTRAVENOUS

## 2016-06-26 MED ORDER — ANTICOAGULANT SODIUM CITRATE 4% (200MG/5ML) IV SOLN
5.0000 mL | Freq: Once | Status: AC
  Administered 2016-06-26: 5 mL via INTRAVENOUS
  Filled 2016-06-26: qty 5

## 2016-06-26 MED ORDER — DEXTROSE 5 % IV SOLN
27.0000 mg/m2 | Freq: Once | INTRAVENOUS | Status: AC
  Administered 2016-06-26: 60 mg via INTRAVENOUS
  Filled 2016-06-26: qty 30

## 2016-06-26 NOTE — Patient Instructions (Signed)
[facility] Discharge Instructions for Patients Receiving Chemotherapy  Today you received the following chemotherapy agents carfilzomib (Kyprolis). To help prevent nausea and vomiting after your treatment, we encourage you to take your nausea medication as prescribed.  ondansetron (ZOFRAN) 8 MG tablet 30 tablet 1 05/10/2016    Sig - Route: Take 1 tablet (8 mg total) by mouth every 8 (eight) hours as needed for refractory nausea / vomiting. - Oral    prochlorperazine (COMPAZINE) 10 MG tablet 30 tablet 1 05/10/2016    Sig - Route: Take 1 tablet (10 mg total) by mouth every 6 (six) hours as needed (Nausea or vomiting). - Oral     If you develop nausea and vomiting that is not controlled by your nausea medication, call the clinic.   BELOW ARE SYMPTOMS THAT SHOULD BE REPORTED IMMEDIATELY:  *FEVER GREATER THAN 100.5 F  *CHILLS WITH OR WITHOUT FEVER  NAUSEA AND VOMITING THAT IS NOT CONTROLLED WITH YOUR NAUSEA MEDICATION  *UNUSUAL SHORTNESS OF BREATH  *UNUSUAL BRUISING OR BLEEDING  TENDERNESS IN MOUTH AND THROAT WITH OR WITHOUT PRESENCE OF ULCERS  *URINARY PROBLEMS  *BOWEL PROBLEMS  UNUSUAL RASH Items with * indicate a potential emergency and should be followed up as soon as possible.  Feel free to call the clinic you have any questions or concerns. The clinic phone number is [phone].  Please show the [facility] at check-in to the Emergency Department and triage nurse.

## 2016-06-28 ENCOUNTER — Telehealth: Payer: Self-pay | Admitting: Hematology and Oncology

## 2016-06-28 NOTE — Telephone Encounter (Signed)
Scheduled appt per 5/29 - left message that additional appts have been added - patient to get new schedule next visit.

## 2016-07-01 MED FILL — ACYCLOVIR 400 MG TABLET: 400 | 30 days supply | Qty: 60 | Fill #3

## 2016-07-02 ENCOUNTER — Ambulatory Visit: Payer: Medicaid Other

## 2016-07-02 ENCOUNTER — Other Ambulatory Visit (HOSPITAL_BASED_OUTPATIENT_CLINIC_OR_DEPARTMENT_OTHER): Payer: Medicaid Other

## 2016-07-02 ENCOUNTER — Ambulatory Visit (HOSPITAL_BASED_OUTPATIENT_CLINIC_OR_DEPARTMENT_OTHER): Payer: Medicaid Other

## 2016-07-02 VITALS — BP 135/91 | HR 67 | Temp 98.0°F | Resp 16

## 2016-07-02 DIAGNOSIS — Z5112 Encounter for antineoplastic immunotherapy: Secondary | ICD-10-CM

## 2016-07-02 DIAGNOSIS — C9000 Multiple myeloma not having achieved remission: Secondary | ICD-10-CM

## 2016-07-02 DIAGNOSIS — Z5111 Encounter for antineoplastic chemotherapy: Secondary | ICD-10-CM

## 2016-07-02 LAB — COMPREHENSIVE METABOLIC PANEL
ALT: 18 U/L (ref 0–55)
ANION GAP: 8 meq/L (ref 3–11)
AST: 12 U/L (ref 5–34)
Albumin: 3.7 g/dL (ref 3.5–5.0)
Alkaline Phosphatase: 38 U/L — ABNORMAL LOW (ref 40–150)
BILIRUBIN TOTAL: 0.98 mg/dL (ref 0.20–1.20)
BUN: 17.6 mg/dL (ref 7.0–26.0)
CALCIUM: 9.1 mg/dL (ref 8.4–10.4)
CO2: 25 mEq/L (ref 22–29)
CREATININE: 1.1 mg/dL (ref 0.7–1.3)
Chloride: 108 mEq/L (ref 98–109)
EGFR: 79 mL/min/{1.73_m2} — ABNORMAL LOW (ref >90)
Glucose: 110 mg/dl (ref 70–140)
Potassium: 3.5 mEq/L (ref 3.5–5.1)
Sodium: 142 mEq/L (ref 136–145)
TOTAL PROTEIN: 5.9 g/dL — AB (ref 6.4–8.3)

## 2016-07-02 LAB — CBC WITH DIFFERENTIAL/PLATELET
BASO%: 0.6 % (ref 0.0–2.0)
Basophils Absolute: 0 10*3/uL (ref 0.0–0.1)
EOS ABS: 0.1 10*3/uL (ref 0.0–0.5)
EOS%: 1.7 % (ref 0.0–7.0)
HEMATOCRIT: 38.5 % (ref 38.4–49.9)
HGB: 13.1 g/dL (ref 13.0–17.1)
LYMPH#: 1.5 10*3/uL (ref 0.9–3.3)
LYMPH%: 31.3 % (ref 14.0–49.0)
MCH: 31.2 pg (ref 27.2–33.4)
MCHC: 34.2 g/dL (ref 32.0–36.0)
MCV: 91.1 fL (ref 79.3–98.0)
MONO#: 0.6 10*3/uL (ref 0.1–0.9)
MONO%: 12 % (ref 0.0–14.0)
NEUT%: 54.4 % (ref 39.0–75.0)
NEUTROS ABS: 2.5 10*3/uL (ref 1.5–6.5)
PLATELETS: 157 10*3/uL (ref 140–400)
RBC: 4.22 10*6/uL (ref 4.20–5.82)
RDW: 15.1 % — ABNORMAL HIGH (ref 11.0–14.6)
WBC: 4.7 10*3/uL (ref 4.0–10.3)

## 2016-07-02 MED ORDER — SODIUM CHLORIDE 0.9 % IV SOLN
Freq: Once | INTRAVENOUS | Status: AC
Start: 2016-07-02 — End: 2016-07-02
  Administered 2016-07-02: 12:00:00 via INTRAVENOUS

## 2016-07-02 MED ORDER — DEXTROSE 5 % IV SOLN
27.0000 mg/m2 | Freq: Once | INTRAVENOUS | Status: AC
  Administered 2016-07-02: 60 mg via INTRAVENOUS
  Filled 2016-07-02: qty 30

## 2016-07-02 MED ORDER — SODIUM CHLORIDE 0.9 % IV SOLN
Freq: Once | INTRAVENOUS | Status: AC
  Administered 2016-07-02: 13:00:00 via INTRAVENOUS

## 2016-07-02 MED ORDER — PALONOSETRON HCL INJECTION 0.25 MG/5ML
0.2500 mg | Freq: Once | INTRAVENOUS | Status: AC
  Administered 2016-07-02: 0.25 mg via INTRAVENOUS

## 2016-07-02 MED ORDER — DEXAMETHASONE SODIUM PHOSPHATE 10 MG/ML IJ SOLN
INTRAMUSCULAR | Status: AC
  Filled 2016-07-02: qty 1

## 2016-07-02 MED ORDER — DEXAMETHASONE SODIUM PHOSPHATE 10 MG/ML IJ SOLN
10.0000 mg | Freq: Once | INTRAMUSCULAR | Status: AC
  Administered 2016-07-02: 10 mg via INTRAVENOUS

## 2016-07-02 MED ORDER — CYCLOPHOSPHAMIDE CHEMO INJECTION 1 GM
300.0000 mg/m2 | Freq: Once | INTRAMUSCULAR | Status: AC
  Administered 2016-07-02: 700 mg via INTRAVENOUS
  Filled 2016-07-02: qty 35

## 2016-07-02 MED ORDER — SODIUM CHLORIDE 0.9% FLUSH
10.0000 mL | INTRAVENOUS | Status: DC | PRN
  Administered 2016-07-02: 10 mL
  Filled 2016-07-02: qty 10

## 2016-07-02 MED ORDER — ANTICOAGULANT SODIUM CITRATE 4% (200MG/5ML) IV SOLN
5.0000 mL | Freq: Once | Status: AC
  Administered 2016-07-02: 5 mL via INTRAVENOUS
  Filled 2016-07-02: qty 5

## 2016-07-02 MED ORDER — PALONOSETRON HCL INJECTION 0.25 MG/5ML
INTRAVENOUS | Status: AC
  Filled 2016-07-02: qty 5

## 2016-07-02 NOTE — Patient Instructions (Signed)
[facility] Discharge Instructions for Patients Receiving Chemotherapy  Today you received the following chemotherapy agents Cytoxan and Kyprolis  To help prevent nausea and vomiting after your treatment, we encourage you to take your nausea medication as directed   If you develop nausea and vomiting that is not controlled by your nausea medication, call the clinic.   BELOW ARE SYMPTOMS THAT SHOULD BE REPORTED IMMEDIATELY:  *FEVER GREATER THAN 100.5 F  *CHILLS WITH OR WITHOUT FEVER  NAUSEA AND VOMITING THAT IS NOT CONTROLLED WITH YOUR NAUSEA MEDICATION  *UNUSUAL SHORTNESS OF BREATH  *UNUSUAL BRUISING OR BLEEDING  TENDERNESS IN MOUTH AND THROAT WITH OR WITHOUT PRESENCE OF ULCERS  *URINARY PROBLEMS  *BOWEL PROBLEMS  UNUSUAL RASH Items with * indicate a potential emergency and should be followed up as soon as possible.  Feel free to call the clinic you have any questions or concerns. The clinic phone number is [phone].  Please show the CHEMO ALERT CARD at check-in to the Emergency Department and triage nurse.   

## 2016-07-02 NOTE — Patient Instructions (Signed)

## 2016-07-03 ENCOUNTER — Ambulatory Visit (HOSPITAL_BASED_OUTPATIENT_CLINIC_OR_DEPARTMENT_OTHER): Payer: Medicaid Other

## 2016-07-03 VITALS — BP 141/92 | HR 66 | Temp 98.3°F | Resp 16

## 2016-07-03 DIAGNOSIS — Z5112 Encounter for antineoplastic immunotherapy: Secondary | ICD-10-CM

## 2016-07-03 DIAGNOSIS — C9000 Multiple myeloma not having achieved remission: Secondary | ICD-10-CM

## 2016-07-03 MED ORDER — DEXAMETHASONE SODIUM PHOSPHATE 10 MG/ML IJ SOLN
10.0000 mg | Freq: Once | INTRAMUSCULAR | Status: AC
  Administered 2016-07-03: 10 mg via INTRAVENOUS

## 2016-07-03 MED ORDER — DEXAMETHASONE SODIUM PHOSPHATE 10 MG/ML IJ SOLN
INTRAMUSCULAR | Status: AC
  Filled 2016-07-03: qty 1

## 2016-07-03 MED ORDER — SODIUM CHLORIDE 0.9% FLUSH
10.0000 mL | INTRAVENOUS | Status: DC | PRN
  Administered 2016-07-03: 10 mL
  Filled 2016-07-03: qty 10

## 2016-07-03 MED ORDER — SODIUM CHLORIDE 0.9 % IV SOLN
Freq: Once | INTRAVENOUS | Status: AC
  Administered 2016-07-03: 11:00:00 via INTRAVENOUS

## 2016-07-03 MED ORDER — ANTICOAGULANT SODIUM CITRATE 4% (200MG/5ML) IV SOLN
5.0000 mL | Freq: Once | Status: AC
  Administered 2016-07-03: 5 mL via INTRAVENOUS
  Filled 2016-07-03: qty 5

## 2016-07-03 MED ORDER — DEXTROSE 5 % IV SOLN
27.0000 mg/m2 | Freq: Once | INTRAVENOUS | Status: AC
  Administered 2016-07-03: 60 mg via INTRAVENOUS
  Filled 2016-07-03: qty 30

## 2016-07-03 NOTE — Patient Instructions (Signed)
[facility] Discharge Instructions for Patients Receiving Chemotherapy  Today you received the following chemotherapy agents: Kyprolis   To help prevent nausea and vomiting after your treatment, we encourage you to take your nausea medication as directed.    If you develop nausea and vomiting that is not controlled by your nausea medication, call the clinic.   BELOW ARE SYMPTOMS THAT SHOULD BE REPORTED IMMEDIATELY:  *FEVER GREATER THAN 100.5 F  *CHILLS WITH OR WITHOUT FEVER  NAUSEA AND VOMITING THAT IS NOT CONTROLLED WITH YOUR NAUSEA MEDICATION  *UNUSUAL SHORTNESS OF BREATH  *UNUSUAL BRUISING OR BLEEDING  TENDERNESS IN MOUTH AND THROAT WITH OR WITHOUT PRESENCE OF ULCERS  *URINARY PROBLEMS  *BOWEL PROBLEMS  UNUSUAL RASH Items with * indicate a potential emergency and should be followed up as soon as possible.  Feel free to call the clinic you have any questions or concerns. The clinic phone number is [phone].  Please show the CHEMO ALERT CARD at check-in to the Emergency Department and triage nurse.   

## 2016-07-05 ENCOUNTER — Other Ambulatory Visit: Payer: Self-pay | Admitting: Physician Assistant

## 2016-07-08 ENCOUNTER — Ambulatory Visit (HOSPITAL_COMMUNITY)
Admission: RE | Admit: 2016-07-08 | Discharge: 2016-07-08 | Disposition: A | Discharge status: Home / Self Care | Payer: Medicaid Other | Source: Ambulatory Visit | Attending: Hematology and Oncology | Admitting: Hematology and Oncology

## 2016-07-08 ENCOUNTER — Encounter (HOSPITAL_COMMUNITY): Payer: Self-pay

## 2016-07-08 DIAGNOSIS — Z79899 Other long term (current) drug therapy: Secondary | ICD-10-CM | POA: Insufficient documentation

## 2016-07-08 DIAGNOSIS — C9000 Multiple myeloma not having achieved remission: Secondary | ICD-10-CM | POA: Insufficient documentation

## 2016-07-08 DIAGNOSIS — D649 Anemia, unspecified: Secondary | ICD-10-CM | POA: Diagnosis not present

## 2016-07-08 HISTORY — DX: Personal history of irradiation: Z92.3

## 2016-07-08 HISTORY — DX: Anesthesia of skin: R20.0

## 2016-07-08 HISTORY — DX: Personal history of antineoplastic chemotherapy: Z92.21

## 2016-07-08 LAB — CBC
HEMATOCRIT: 36.4 % — AB (ref 39.0–52.0)
Hemoglobin: 12.7 g/dL — ABNORMAL LOW (ref 13.0–17.0)
MCH: 30.9 pg (ref 26.0–34.0)
MCHC: 34.9 g/dL (ref 30.0–36.0)
MCV: 88.6 fL (ref 78.0–100.0)
Platelets: 161 10*3/uL (ref 150–400)
RBC: 4.11 MIL/uL — AB (ref 4.22–5.81)
RDW: 14.1 % (ref 11.5–15.5)
WBC: 3.9 10*3/uL — AB (ref 4.0–10.5)

## 2016-07-08 LAB — PROTIME-INR
INR: 1
Prothrombin Time: 13.1 seconds (ref 11.4–15.2)

## 2016-07-08 LAB — APTT: aPTT: 27 seconds (ref 24–36)

## 2016-07-08 MED ORDER — FENTANYL CITRATE (PF) 100 MCG/2ML IJ SOLN
INTRAMUSCULAR | Status: AC | PRN
  Administered 2016-07-08 (×2): 50 ug via INTRAVENOUS
  Administered 2016-07-08: 25 ug via INTRAVENOUS

## 2016-07-08 MED ORDER — FENTANYL CITRATE (PF) 100 MCG/2ML IJ SOLN
INTRAMUSCULAR | Status: AC
  Filled 2016-07-08: qty 4

## 2016-07-08 MED ORDER — SODIUM CHLORIDE 0.9 % IV SOLN
INTRAVENOUS | Status: DC
  Administered 2016-07-08: 10:00:00 via INTRAVENOUS

## 2016-07-08 MED ORDER — MIDAZOLAM HCL 2 MG/2ML IJ SOLN
INTRAMUSCULAR | Status: AC | PRN
  Administered 2016-07-08 (×2): 1 mg via INTRAVENOUS
  Administered 2016-07-08: 0.5 mg via INTRAVENOUS

## 2016-07-08 MED ORDER — MIDAZOLAM HCL 2 MG/2ML IJ SOLN
INTRAMUSCULAR | Status: AC
  Filled 2016-07-08: qty 4

## 2016-07-08 MED ORDER — HEPARIN SOD (PORK) LOCK FLUSH 100 UNIT/ML IV SOLN
500.0000 [IU] | INTRAVENOUS | Status: AC | PRN
  Administered 2016-07-08: 500 [IU]
  Filled 2016-07-08: qty 5

## 2016-07-08 MED FILL — LIDOCAINE-PRILOCAINE CREAM: 2.5-2.5 | 10 days supply | Qty: 30 | Fill #1 | Status: TO

## 2016-07-08 NOTE — Procedures (Signed)
Interventional Radiology Procedure Note  Procedure: CT guided aspirate and core biopsy of right posterior iliac bone Complications: None Recommendations: - Bedrest supine x 1 hrs - OTC's PRN  Pain - Follow biopsy results  Signed,  Urban Naval S. Arrington Yohe, DO    

## 2016-07-08 NOTE — Discharge Instructions (Signed)
Moderate Conscious Sedation, Adult, Care After °These instructions provide you with information about caring for yourself after your procedure. Your health care provider may also give you more specific instructions. Your treatment has been planned according to current medical practices, but problems sometimes occur. Call your health care provider if you have any problems or questions after your procedure. °What can I expect after the procedure? °After your procedure, it is common: °· To feel sleepy for several hours. °· To feel clumsy and have poor balance for several hours. °· To have poor judgment for several hours. °· To vomit if you eat too soon. ° °Follow these instructions at home: °For at least 24 hours after the procedure: ° °· Do not: °? Participate in activities where you could fall or become injured. °? Drive. °? Use heavy machinery. °? Drink alcohol. °? Take sleeping pills or medicines that cause drowsiness. °? Make important decisions or sign legal documents. °? Take care of children on your own. °· Rest. °Eating and drinking °· Follow the diet recommended by your health care provider. °· If you vomit: °? Drink water, juice, or soup when you can drink without vomiting. °? Make sure you have little or no nausea before eating solid foods. °General instructions °· Have a responsible adult stay with you until you are awake and alert. °· Take over-the-counter and prescription medicines only as told by your health care provider. °· If you smoke, do not smoke without supervision. °· Keep all follow-up visits as told by your health care provider. This is important. °Contact a health care provider if: °· You keep feeling nauseous or you keep vomiting. °· You feel light-headed. °· You develop a rash. °· You have a fever. °Get help right away if: °· You have trouble breathing. °This information is not intended to replace advice given to you by your health care provider. Make sure you discuss any questions you have  with your health care provider. °Document Released: 11/04/2012 Document Revised: 06/19/2015 Document Reviewed: 05/06/2015 °Elsevier Interactive Patient Education © 2018 [employer] °Bone Marrow Aspiration and Bone Marrow Biopsy, Adult, Care After °This sheet gives you information about how to care for yourself after your procedure. Your health care provider may also give you more specific instructions. If you have problems or questions, contact your health care provider. °What can I expect after the procedure? °After the procedure, it is common to have: °· Mild pain and tenderness. °· Swelling. °· Bruising. ° °Follow these instructions at home: °· Take over-the-counter or prescription medicines only as told by your health care provider. °· Do not take baths, swim, or use a hot tub until your health care provider approves. Ask if you can take a shower or have a sponge bath. °· Follow instructions from your health care provider about how to take care of the puncture site. Make sure you: °? Wash your hands with soap and water before you change your bandage (dressing). If soap and water are not available, use hand sanitizer. °? Change your dressing as told by your health care provider. °· Check your puncture site every day for signs of infection. Check for: °? More redness, swelling, or pain. °? More fluid or blood. °? Warmth. °? Pus or a bad smell. °· Return to your normal activities as told by your health care provider. Ask your health care provider what activities are safe for you. °· Do not drive for 24 hours if you were given a medicine to help you relax (sedative). °·   Keep all follow-up visits as told by your health care provider. This is important. °Contact a health care provider if: °· You have more redness, swelling, or pain around the puncture site. °· You have more fluid or blood coming from the puncture site. °· Your puncture site feels warm to the touch. °· You have pus or a bad smell coming from the  puncture site. °· You have a fever. °· Your pain is not controlled with medicine. °This information is not intended to replace advice given to you by your health care provider. Make sure you discuss any questions you have with your health care provider. °Document Released: 08/03/2004 Document Revised: 08/04/2015 Document Reviewed: 06/28/2015 °Elsevier Interactive Patient Education © 2018 [employer] ° °

## 2016-07-08 NOTE — H&P (Signed)
Referring Physician(s): [name]  Supervising Physician: Corrie Mckusick  Patient Status:  [facility]  Chief Complaint:  "I'm having a bone biopsy"  Subjective: Patient familiar to IR service from prior Port-A-Cath placement on 05/16/16. He has a history of multiple myeloma and  is currently on chemotherapy. He presents again today for CT-guided bone marrow biopsy to assess response. He currently denies fever, headache, chest pain, dyspnea, cough, abdominal/back pain, nausea, vomiting or abnormal bleeding. Past Medical History:  Diagnosis Date  . 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 Multiple myeloma not having achieved remission ([place]) 06/25/15   Past Surgical History:  Procedure Laterality Date  . ANTERIOR CRUCIATE LIGAMENT REPAIR    . IR FLUORO GUIDE PORT INSERTION RIGHT  05/16/2016  . IR US GUIDE VASC ACCESS RIGHT  05/16/2016  . LAMINECTOMY N/A 06/25/2015   Procedure: Thoracic six LAMINECTOMY RESECTION FOR TUMOR;  Surgeon: Consuella Lose, MD;  Location: [facility];  Service: Neurosurgery;  Laterality: N/A;     Allergies: Patient has no known allergies.  Medications: Prior to Admission medications   Medication Sig Start Date End Date Taking? Authorizing Provider  acyclovir (ZOVIRAX) 400 MG tablet Take 1 tablet (400 mg total) by mouth 2 (two) times daily. 03/18/16   [name], MD  calcium carbonate (TUMS) 500 MG chewable tablet Chew 1 tablet (200 mg of elemental calcium total) by mouth 2 (two) times daily. 04/18/16   [name], MD  cholecalciferol (VITAMIN D) 1000 units tablet Take 1,000 Units by mouth daily.    [provider]  dexamethasone (DECADRON) 4 MG tablet 20 mg weekly to take by mouth on Sundays 05/21/16   [name], MD  gabapentin (NEURONTIN) 300 MG capsule Take 2 capsules (600 mg total) by mouth 3 (three) times daily. Patient taking differently: Take 600 mg by mouth at bedtime as needed (nerve pain).  12/19/15   [name], MD    HYDROcodone-acetaminophen (NORCO/VICODIN) 5-325 MG tablet Take 1 tablet by mouth 3 (three) times daily as needed for moderate pain. 05/28/16   [name], MD  lidocaine-prilocaine (EMLA) cream Apply to affected area once 05/10/16   [name], MD  ondansetron (ZOFRAN) 8 MG tablet Take 1 tablet (8 mg total) by mouth every 8 (eight) hours as needed for refractory nausea / vomiting. 05/10/16   [name], MD  prochlorperazine (COMPAZINE) 10 MG tablet Take 1 tablet (10 mg total) by mouth every 6 (six) hours as needed (Nausea or vomiting). 05/10/16   [name], MD  senna-docusate (SENOKOT-S) 8.6-50 MG tablet Take 1 tablet by mouth at bedtime as needed for mild constipation. Patient not taking: Reported on 06/25/2016 06/28/15   Cristal Ford, DO     Vital Signs: BP (!) 149/107 (BP Location: Right Arm)   Pulse 70   Temp 98.3 F (36.8 C) (Oral)   Resp 16   SpO2 100%   Physical Exam awake, alert. Chest clear to auscultation bilaterally. Clean, intact right chest wall Port-A-Cath; heart with regular rate and rhythm. Abdomen soft, positive bowel sounds, nontender. No lower extremity edema.  Imaging: No results found.  Labs:  CBC:  Recent Labs  06/04/16 1038 06/18/16 1006 06/25/16 1319 07/02/16 1051  WBC 4.4 3.6* 3.7* 4.7  HGB 13.6 13.3 13.1 13.1  HCT 39.8 39.1 38.3* 38.5  PLT 167 215 161 157    COAGS:  Recent Labs  05/16/16 0800  INR 1.01  APTT 32    BMP:  Recent  Labs  07/20/15 0755  06/04/16 1038 06/18/16 1006 06/25/16 1319 07/02/16 1051  NA 140  < > 142 144 142 142  K 4.0  < > 4.1 4.0 3.9 3.5  CL 110  --   --   --   --   --   CO2 24  < > 27 24 24 25   GLUCOSE 105*  < > 105 96 89 110  BUN 23*  < > 23.6 19.3 19.0 17.6  CALCIUM 9.2  < > 9.8 9.0 9.2 9.1  CREATININE 1.64*  < > 0.9 1.1 0.9 1.1  GFRNONAA 49*  --   --   --   --   --   GFRAA 56*  --   --   --   --   --   < > = values in this interval not displayed.  LIVER FUNCTION TESTS:  Recent Labs   06/04/16 1038 06/18/16 1006 06/18/16 1006 06/25/16 1319 07/02/16 1051  BILITOT 0.92 0.82  --  0.93 0.98  AST 19 18  --  14 12  ALT 44 23  --  17 18  ALKPHOS 46 43  --  36* 38*  PROT 6.4 6.4 6.1 6.2* 5.9*  ALBUMIN 3.7 3.9  --  3.9 3.7    Assessment and Plan: Pt with history of multiple myeloma ; currently on chemotherapy. He presents today for CT-guided bone marrow biopsy to assess response. Risks and benefits discussed with the patient/sister including, but not limited to bleeding, infection, damage to adjacent structures or low yield requiring additional tests. All of the patient's questions were answered, patient is agreeable to proceed. Consent signed and in chart.     Electronically Signed: D. Rowe Robert, PA-C 07/08/2016, 9:22 AM   I spent a total of 20 minutes at the the patient's bedside AND on the patient's hospital floor or unit, greater than 50% of which was counseling/coordinating care for CT-guided bone marrow biopsy

## 2016-07-11 ENCOUNTER — Telehealth: Payer: Self-pay | Admitting: *Deleted

## 2016-07-11 NOTE — Telephone Encounter (Signed)
-----  Message from [name], MD sent at 07/10/2016  5:10 PM EDT ----- Regarding: bone marrow biopsy pls fax a copy of bone marrow biopsy to [facility] ASAP and call Urban Gibson to alert her of the positive bone marrow result ----- Message ----- From: Interface, Lab In [facility] Sent: 07/10/2016   4:09 PM To: [name], MD

## 2016-07-11 NOTE — Telephone Encounter (Signed)
Bone marrow report faxed to [facility]. Notified Wells Guiles

## 2016-07-16 ENCOUNTER — Ambulatory Visit (HOSPITAL_BASED_OUTPATIENT_CLINIC_OR_DEPARTMENT_OTHER): Payer: Medicaid Other | Admitting: Hematology and Oncology

## 2016-07-16 ENCOUNTER — Ambulatory Visit (HOSPITAL_BASED_OUTPATIENT_CLINIC_OR_DEPARTMENT_OTHER): Payer: Medicaid Other

## 2016-07-16 ENCOUNTER — Other Ambulatory Visit (HOSPITAL_BASED_OUTPATIENT_CLINIC_OR_DEPARTMENT_OTHER): Payer: Medicaid Other

## 2016-07-16 ENCOUNTER — Ambulatory Visit: Payer: Medicaid Other

## 2016-07-16 DIAGNOSIS — D61818 Other pancytopenia: Secondary | ICD-10-CM

## 2016-07-16 DIAGNOSIS — C9000 Multiple myeloma not having achieved remission: Secondary | ICD-10-CM

## 2016-07-16 DIAGNOSIS — G893 Neoplasm related pain (acute) (chronic): Secondary | ICD-10-CM

## 2016-07-16 LAB — COMPREHENSIVE METABOLIC PANEL
ALBUMIN: 3.8 g/dL (ref 3.5–5.0)
ALK PHOS: 35 U/L — AB (ref 40–150)
ALT: 17 U/L (ref 0–55)
AST: 16 U/L (ref 5–34)
Anion Gap: 8 mEq/L (ref 3–11)
BILIRUBIN TOTAL: 0.82 mg/dL (ref 0.20–1.20)
BUN: 17.6 mg/dL (ref 7.0–26.0)
CO2: 24 meq/L (ref 22–29)
CREATININE: 1.1 mg/dL (ref 0.7–1.3)
Calcium: 9.2 mg/dL (ref 8.4–10.4)
Chloride: 110 mEq/L — ABNORMAL HIGH (ref 98–109)
EGFR: 80 mL/min/{1.73_m2} — ABNORMAL LOW (ref >90)
GLUCOSE: 127 mg/dL (ref 70–140)
Potassium: 3.8 mEq/L (ref 3.5–5.1)
SODIUM: 142 meq/L (ref 136–145)
TOTAL PROTEIN: 6.2 g/dL — AB (ref 6.4–8.3)

## 2016-07-16 LAB — CBC WITH DIFFERENTIAL/PLATELET
BASO%: 0.6 % (ref 0.0–2.0)
BASOS ABS: 0 10*3/uL (ref 0.0–0.1)
EOS ABS: 0.1 10*3/uL (ref 0.0–0.5)
EOS%: 2.9 % (ref 0.0–7.0)
HCT: 35.6 % — ABNORMAL LOW (ref 38.4–49.9)
HEMOGLOBIN: 12.3 g/dL — AB (ref 13.0–17.1)
LYMPH#: 1.3 10*3/uL (ref 0.9–3.3)
LYMPH%: 37.2 % (ref 14.0–49.0)
MCH: 31 pg (ref 27.2–33.4)
MCHC: 34.6 g/dL (ref 32.0–36.0)
MCV: 89.7 fL (ref 79.3–98.0)
MONO#: 0.3 10*3/uL (ref 0.1–0.9)
MONO%: 9 % (ref 0.0–14.0)
NEUT%: 50.3 % (ref 39.0–75.0)
NEUTROS ABS: 1.7 10*3/uL (ref 1.5–6.5)
NRBC: 0 % (ref 0–0)
Platelets: 183 10*3/uL (ref 140–400)
RBC: 3.97 10*6/uL — AB (ref 4.20–5.82)
RDW: 13.9 % (ref 11.0–14.6)
WBC: 3.4 10*3/uL — AB (ref 4.0–10.3)

## 2016-07-16 MED ORDER — ANTICOAGULANT SODIUM CITRATE 4% (200MG/5ML) IV SOLN
5.0000 mL | Freq: Once | Status: DC
  Filled 2016-07-16: qty 5

## 2016-07-16 MED ORDER — SODIUM CHLORIDE 0.9% FLUSH
10.0000 mL | Freq: Once | INTRAVENOUS | Status: AC
  Administered 2016-07-16: 10 mL
  Filled 2016-07-16: qty 10

## 2016-07-16 NOTE — Patient Instructions (Signed)
Implanted Port Home Guide An implanted port is a type of central line that is placed under the skin. Central lines are used to provide IV access when treatment or nutrition needs to be given through a person's veins. Implanted ports are used for long-term IV access. An implanted port may be placed because:  You need IV medicine that would be irritating to the small veins in your hands or arms.  You need long-term IV medicines, such as antibiotics.  You need IV nutrition for a long period.  You need frequent blood draws for lab tests.  You need dialysis.  Implanted ports are usually placed in the chest area, but they can also be placed in the upper arm, the abdomen, or the leg. An implanted port has two main parts:  Reservoir. The reservoir is round and will appear as a small, raised area under your skin. The reservoir is the part where a needle is inserted to give medicines or draw blood.  Catheter. The catheter is a thin, flexible tube that extends from the reservoir. The catheter is placed into a large vein. Medicine that is inserted into the reservoir goes into the catheter and then into the vein.  How will I care for my incision site? Do not get the incision site wet. Bathe or shower as directed by your health care provider. How is my port accessed? Special steps must be taken to access the port:  Before the port is accessed, a numbing cream can be placed on the skin. This helps numb the skin over the port site.  Your health care provider uses a sterile technique to access the port. ? Your health care provider must put on a mask and sterile gloves. ? The skin over your port is cleaned carefully with an antiseptic and allowed to dry. ? The port is gently pinched between sterile gloves, and a needle is inserted into the port.  Only "non-coring" port needles should be used to access the port. Once the port is accessed, a blood return should be checked. This helps ensure that the port  is in the vein and is not clogged.  If your port needs to remain accessed for a constant infusion, a clear (transparent) bandage will be placed over the needle site. The bandage and needle will need to be changed every week, or as directed by your health care provider.  Keep the bandage covering the needle clean and dry. Do not get it wet. Follow your health care provider's instructions on how to take a shower or bath while the port is accessed.  If your port does not need to stay accessed, no bandage is needed over the port.  What is flushing? Flushing helps keep the port from getting clogged. Follow your health care provider's instructions on how and when to flush the port. Ports are usually flushed with saline solution or a medicine called heparin. The need for flushing will depend on how the port is used.  If the port is used for intermittent medicines or blood draws, the port will need to be flushed: ? After medicines have been given. ? After blood has been drawn. ? As part of routine maintenance.  If a constant infusion is running, the port may not need to be flushed.  How long will my port stay implanted? The port can stay in for as long as your health care provider thinks it is needed. When it is time for the port to come out, surgery will be   done to remove it. The procedure is similar to the one performed when the port was put in. When should I seek immediate medical care? When you have an implanted port, you should seek immediate medical care if:  You notice a bad smell coming from the incision site.  You have swelling, redness, or drainage at the incision site.  You have more swelling or pain at the port site or the surrounding area.  You have a fever that is not controlled with medicine.  This information is not intended to replace advice given to you by your health care provider. Make sure you discuss any questions you have with your health care provider. Document  Released: 01/14/2005 Document Revised: 06/22/2015 Document Reviewed: 09/21/2012 Elsevier Interactive Patient Education  2017 [employer] Inc.  

## 2016-07-17 ENCOUNTER — Encounter: Payer: Self-pay | Admitting: Hematology and Oncology

## 2016-07-17 ENCOUNTER — Ambulatory Visit: Payer: Self-pay

## 2016-07-17 DIAGNOSIS — D61818 Other pancytopenia: Secondary | ICD-10-CM | POA: Insufficient documentation

## 2016-07-17 LAB — KAPPA/LAMBDA LIGHT CHAINS
Ig Kappa Free Light Chain: 6.3 mg/L (ref 3.3–19.4)
Ig Lambda Free Light Chain: 38.6 mg/L — ABNORMAL HIGH (ref 5.7–26.3)
Kappa/Lambda FluidC Ratio: 0.16 — ABNORMAL LOW (ref 0.26–1.65)

## 2016-07-17 NOTE — Assessment & Plan Note (Signed)
This is stable He will continue pain medication as prescribed

## 2016-07-17 NOTE — Assessment & Plan Note (Signed)
He has mild pancytopenia due to recent treatment He is not symptomatic I recommend observation only

## 2016-07-17 NOTE — Progress Notes (Signed)
[facility] OFFICE PROGRESS NOTE  Patient Care Team: [name], MD as PCP - General (Internal Medicine)  SUMMARY OF ONCOLOGIC HISTORY:   Multiple myeloma not having achieved remission [facility])   06/23/2015 - 06/28/2015 Hospital Admission    The patient was admitted to the hospital due to gait ataxia and back pain. He was subsequently found to have cord compression underwent surgery and was discharged home      06/24/2015 Imaging    Abnormal appearance of the T6 vertebral body, highly suspicious for possible osseous metastasis. Associated pathologic fracture withup to 30% height loss. There is associated abnormal soft tissue density within the ventral epidural space,      06/24/2015 Imaging    MRI lumbar: Focal osseous lesion with abnormal enhancement involving the right pedicle of L3, suspicious for possible osseous metastasisgiven the findings in the thoracic spine. Question additional focal lesion within the right iliac wing as above.        06/25/2015 Pathology Results    Accession: [id number] bone biopsy come from plasma cell neoplasm.      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 Zometa is not given due to inability to get dental clearance      12/14/2015 - 04/24/2016 Chemotherapy    He is started on maintenance treatment with Revlimid only      12/18/2015 Imaging    MRI thoracic and lumbar spine showed numerous enhancing foci throughout the thoracic and lumbar spine with several new small foci in the lumbar spine in comparison with prior MRI compatible with metastatic disease. Stable loss of height of the T3, T4, and T6 vertebral bodies and new postsurgical  changes related to T6 laminectomy. No significant epidural disease or evidence for cord compression. No abnormal enhancement of the spinal cord or cauda equina.      05/02/2016 Bone Marrow Biopsy    Outside bone marrow biopsy showed 20% myeloma involvement      05/16/2016 Procedure    Successful placement of a right internal jugular approach power injectable Port-A-Cath. The catheter is ready for immediate use.      05/21/2016 - 07/03/2016 Chemotherapy    He received Kyprolis, Cytoxan and dexamethasone       07/08/2016 Procedure    Status post CT-guided bone marrow biopsy, with tissue specimen sent to pathology for complete histopathologic analysis      07/08/2016 Bone Marrow Biopsy    Bone Marrow, Aspirate,Biopsy, and Clot BONE MARROW: - MILDLY HYPERCELLULAR MARROW (60%) WITH PLASMA CELL NEOPLASM - SEE COMMENT PERIPHERAL BLOOD: - NORMOCYTIC ANEMIA Diagnosis Note The marrow is hypercellular with lambda-restricted plasma cells consistent with persistence of the patient's previously diagnosed plasma cell neoplasm. The plasma cells comprise approximately 10-15% of the total marrow cellularity, are enlarged, and arranged in clusters.       INTERVAL HISTORY: Please see below for problem oriented charting. He returns for further follow-up He complained of excessive fatigue since recent chemo His back pain about the same He denies progression of peripheral neuropathy Denies recent infection  REVIEW OF SYSTEMS:   Constitutional: Denies fevers, chills or abnormal weight loss Eyes: Denies blurriness of vision Ears, nose, mouth, throat, and  face: Denies mucositis or sore throat Respiratory: Denies cough, dyspnea or wheezes Cardiovascular: Denies palpitation, chest discomfort or lower extremity swelling Gastrointestinal:  Denies nausea, heartburn or change in bowel habits Skin: Denies abnormal skin rashes Lymphatics: Denies new lymphadenopathy or easy bruising Neurological:Denies  numbness, tingling or new weaknesses Behavioral/Psych: Mood is stable, no new changes  All other systems were reviewed with the patient and are negative.  I have reviewed the past medical history, past surgical history, social history and family history with the patient and they are unchanged from previous note.  ALLERGIES:  has No Known Allergies.  MEDICATIONS:  Current Outpatient Prescriptions  Medication Sig Dispense Refill  . acyclovir (ZOVIRAX) 400 MG tablet Take 1 tablet (400 mg total) by mouth 2 (two) times daily. 60 tablet 11  . calcium carbonate (TUMS) 500 MG chewable tablet Chew 1 tablet (200 mg of elemental calcium total) by mouth 2 (two) times daily. 60 tablet 9  . cholecalciferol (VITAMIN D) 1000 units tablet Take 1,000 Units by mouth daily.    Marland Kitchen gabapentin (NEURONTIN) 300 MG capsule Take 2 capsules (600 mg total) by mouth 3 (three) times daily. (Patient taking differently: Take 600 mg by mouth at bedtime as needed (nerve pain). ) 90 capsule 6  . HYDROcodone-acetaminophen (NORCO/VICODIN) 5-325 MG tablet Take 1 tablet by mouth 3 (three) times daily as needed for moderate pain. 90 tablet 0  . lidocaine-prilocaine (EMLA) cream Apply to affected area once 30 g 3  . ondansetron (ZOFRAN) 8 MG tablet Take 1 tablet (8 mg total) by mouth every 8 (eight) hours as needed for refractory nausea / vomiting. 30 tablet 1  . prochlorperazine (COMPAZINE) 10 MG tablet Take 1 tablet (10 mg total) by mouth every 6 (six) hours as needed (Nausea or vomiting). 30 tablet 1  . senna-docusate (SENOKOT-S) 8.6-50 MG tablet Take 1 tablet by mouth at bedtime as needed for mild constipation. 30 tablet 0   No current facility-administered medications for this visit.     PHYSICAL EXAMINATION: ECOG PERFORMANCE STATUS: 1 - Symptomatic but completely ambulatory  Vitals:   07/16/16 1205  BP: (!) 142/85  Pulse: 73  Resp: 18  Temp: 98.6 F (37 C)   Filed Weights   07/16/16 1205  Weight: 226 lb 8 oz (102.7  kg)    GENERAL:alert, no distress and comfortable SKIN: skin color, texture, turgor are normal, no rashes or significant lesions EYES: normal, Conjunctiva are pink and non-injected, sclera clear OROPHARYNX:no exudate, no erythema and lips, buccal mucosa, and tongue normal  NECK: supple, thyroid normal size, non-tender, without nodularity LYMPH:  no palpable lymphadenopathy in the cervical, axillary or inguinal LUNGS: clear to auscultation and percussion with normal breathing effort HEART: regular rate & rhythm and no murmurs and no lower extremity edema ABDOMEN:abdomen soft, non-tender and normal bowel sounds Musculoskeletal:no cyanosis of digits and no clubbing  NEURO: alert & oriented x 3 with fluent speech, no focal motor/sensory deficits  LABORATORY DATA:  I have reviewed the data as listed    Component Value Date/Time   NA 142 07/16/2016 1120   K 3.8 07/16/2016 1120   CL 110 07/20/2015 0755   CO2 24 07/16/2016 1120   GLUCOSE 127 07/16/2016 1120   BUN 17.6 07/16/2016 1120   CREATININE 1.1 07/16/2016 1120   CALCIUM 9.2 07/16/2016 1120   PROT 6.2 (L) 07/16/2016 1120   ALBUMIN 3.8 07/16/2016 1120   AST 16 07/16/2016 1120   ALT 17 07/16/2016 1120   ALKPHOS 35 (L) 07/16/2016  1120   BILITOT 0.82 07/16/2016 1120   GFRNONAA 49 (L) 07/20/2015 0755   GFRAA 56 (L) 07/20/2015 0755    No results found for: SPEP, UPEP  Lab Results  Component Value Date   WBC 3.4 (L) 07/16/2016   NEUTROABS 1.7 07/16/2016   HGB 12.3 (L) 07/16/2016   HCT 35.6 (L) 07/16/2016   MCV 89.7 07/16/2016   PLT 183 07/16/2016      Chemistry      Component Value Date/Time   NA 142 07/16/2016 1120   K 3.8 07/16/2016 1120   CL 110 07/20/2015 0755   CO2 24 07/16/2016 1120   BUN 17.6 07/16/2016 1120   CREATININE 1.1 07/16/2016 1120      Component Value Date/Time   CALCIUM 9.2 07/16/2016 1120   ALKPHOS 35 (L) 07/16/2016 1120   AST 16 07/16/2016 1120   ALT 17 07/16/2016 1120   BILITOT 0.82  07/16/2016 1120       RADIOGRAPHIC STUDIES: I have personally reviewed the radiological images as listed and agreed with the findings in the report. Ct Biopsy  Result Date: 07/08/2016 INDICATION: 46 year old male with a history of multiple myeloma. He has been referred for bone marrow biopsy EXAM: CT BONE MARROW BIOPSY AND ASPIRATION; CT BIOPSY MEDICATIONS: None. ANESTHESIA/SEDATION: Moderate (conscious) sedation was employed during this procedure. A total of Versed 2.5 mg and Fentanyl 125 mcg was administered intravenously. Moderate Sedation Time: 20 minutes. The patient's level of consciousness and vital signs were monitored continuously by radiology nursing throughout the procedure under my direct supervision. FLUOROSCOPY TIME:  CT COMPLICATIONS: None PROCEDURE: The procedure risks, benefits, and alternatives were explained to the patient. Questions regarding the procedure were encouraged and answered. The patient understands and consents to the procedure. Scout CT of the pelvis was performed for surgical planning purposes. The posterior pelvis was prepped with chlorhexidinein a sterile fashion, and a sterile drape was applied covering the operative field. A sterile gown and sterile gloves were used for the procedure. Local anesthesia was provided with 1% Lidocaine. We targeted the right posterior iliac bone for biopsy. The skin and subcutaneous tissues were infiltrated with 1% lidocaine without epinephrine. A small stab incision was made with an 11 blade scalpel, and an 11 gauge Murphy needle was advanced with CT guidance to the posterior cortex. Manual forced was used to advance the needle through the posterior cortex and the stylet was removed. A bone marrow aspirate was retrieved and passed to a cytotechnologist in the room. The Murphy needle was then advanced without the stylet for a core biopsy. The core biopsy was retrieved and also passed to a cytotechnologist. Multiple passes were attempted with  the [facility] needle for adequate sample. Manual pressure was used for hemostasis and a sterile dressing was placed. No complications were encountered no significant blood loss was encountered. Patient tolerated the procedure well and remained hemodynamically stable throughout. IMPRESSION: Status post CT-guided bone marrow biopsy, with tissue specimen sent to pathology for complete histopathologic analysis Signed, Dulcy Fanny. Earleen Newport, DO [facility] Electronically Signed   By: Corrie Mckusick D.O.   On: 07/08/2016 12:26   Ct Bone Marrow Biopsy & Aspiration  Result Date: 07/08/2016 INDICATION: 46 year old male with a history of multiple myeloma. He has been referred for bone marrow biopsy EXAM: CT BONE MARROW BIOPSY AND ASPIRATION; CT BIOPSY MEDICATIONS: None. ANESTHESIA/SEDATION: Moderate (conscious) sedation was employed during this procedure. A total of Versed 2.5 mg and Fentanyl 125 mcg was administered  intravenously. Moderate Sedation Time: 20 minutes. The patient's level of consciousness and vital signs were monitored continuously by radiology nursing throughout the procedure under my direct supervision. FLUOROSCOPY TIME:  CT COMPLICATIONS: None PROCEDURE: The procedure risks, benefits, and alternatives were explained to the patient. Questions regarding the procedure were encouraged and answered. The patient understands and consents to the procedure. Scout CT of the pelvis was performed for surgical planning purposes. The posterior pelvis was prepped with chlorhexidinein a sterile fashion, and a sterile drape was applied covering the operative field. A sterile gown and sterile gloves were used for the procedure. Local anesthesia was provided with 1% Lidocaine. We targeted the right posterior iliac bone for biopsy. The skin and subcutaneous tissues were infiltrated with 1% lidocaine without epinephrine. A small stab incision was made with an 11 blade scalpel,  and an 11 gauge Murphy needle was advanced with CT guidance to the posterior cortex. Manual forced was used to advance the needle through the posterior cortex and the stylet was removed. A bone marrow aspirate was retrieved and passed to a cytotechnologist in the room. The Murphy needle was then advanced without the stylet for a core biopsy. The core biopsy was retrieved and also passed to a cytotechnologist. Multiple passes were attempted with the [facility] needle for adequate sample. Manual pressure was used for hemostasis and a sterile dressing was placed. No complications were encountered no significant blood loss was encountered. Patient tolerated the procedure well and remained hemodynamically stable throughout. IMPRESSION: Status post CT-guided bone marrow biopsy, with tissue specimen sent to pathology for complete histopathologic analysis Signed, Dulcy Fanny. Earleen Newport, DO [facility] Electronically Signed   By: Corrie Mckusick D.O.   On: 07/08/2016 12:26    ASSESSMENT & PLAN:  Multiple myeloma not having achieved remission ([place]) Recent PET CT scan done at [facility] and blood work show he is in near complete remission Bone marrow biopsy shows some residual plasma cells I have been in contact with physician at [facility] We will discontinue chemotherapy and he will proceed with autologous stem cell transplant I will see him back at the transplant to discuss maintenance treatmented   Pancytopenia, acquired [facility]) He has mild pancytopenia due to recent treatment He is not symptomatic I recommend observation only  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  All questions were answered. The patient knows to call the clinic with any problems, questions or concerns. No barriers to learning was  detected. I spent 15 minutes counseling the patient face to face. The total time spent in the appointment was 20 minutes and more than 50% was on counseling and review of test results     [name], MD 07/17/2016 4:41 PM

## 2016-07-17 NOTE — Assessment & Plan Note (Signed)
Recent PET CT scan done at [facility] and blood work show he is in near complete remission Bone marrow biopsy shows some residual plasma cells I have been in contact with physician at [facility] We will discontinue chemotherapy and he will proceed with autologous stem cell transplant I will see him back at the transplant to discuss maintenance treatmented  

## 2016-07-18 LAB — MULTIPLE MYELOMA PANEL, SERUM
ALBUMIN/GLOB SERPL: 1.8 — AB (ref 0.7–1.7)
ALPHA 1: 0.2 g/dL (ref 0.0–0.4)
Albumin SerPl Elph-Mcnc: 3.7 g/dL (ref 2.9–4.4)
Alpha2 Glob SerPl Elph-Mcnc: 0.5 g/dL (ref 0.4–1.0)
B-Globulin SerPl Elph-Mcnc: 0.9 g/dL (ref 0.7–1.3)
Gamma Glob SerPl Elph-Mcnc: 0.6 g/dL (ref 0.4–1.8)
Globulin, Total: 2.1 g/dL — ABNORMAL LOW (ref 2.2–3.9)
IGA/IMMUNOGLOBULIN A, SERUM: 37 mg/dL — AB (ref 90–386)
IGM (IMMUNOGLOBIN M), SRM: 18 mg/dL — AB (ref 20–172)
IgG, Qn, Serum: 714 mg/dL (ref 700–1600)
Total Protein: 5.8 g/dL — ABNORMAL LOW (ref 6.0–8.5)

## 2016-07-23 ENCOUNTER — Ambulatory Visit: Payer: Self-pay

## 2016-07-23 ENCOUNTER — Other Ambulatory Visit: Payer: Self-pay

## 2016-07-24 ENCOUNTER — Ambulatory Visit: Payer: Self-pay

## 2016-07-24 ENCOUNTER — Encounter (HOSPITAL_COMMUNITY): Payer: Self-pay

## 2016-07-25 MED FILL — ACYCLOVIR 400 MG TABLET: 400 | 30 days supply | Qty: 60 | Fill #4

## 2016-08-01 ENCOUNTER — Other Ambulatory Visit: Payer: Self-pay

## 2016-08-01 ENCOUNTER — Ambulatory Visit: Payer: Self-pay

## 2016-08-02 ENCOUNTER — Ambulatory Visit: Payer: Self-pay

## 2016-08-02 LAB — CHROMOSOME ANALYSIS, BONE MARROW

## 2016-08-02 LAB — TISSUE HYBRIDIZATION (BONE MARROW)-NCBH

## 2016-08-28 ENCOUNTER — Telehealth: Payer: Self-pay | Admitting: *Deleted

## 2016-08-28 NOTE — Telephone Encounter (Signed)
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 He will need labs twice a week beginning week of 8/6. And will need to be see Dr Alvy Bimler any day between 8/3 and 8/15. Will fax over lab orders etc.

## 2016-08-30 ENCOUNTER — Telehealth: Payer: Self-pay

## 2016-08-30 ENCOUNTER — Other Ambulatory Visit: Payer: Self-pay | Admitting: Hematology and Oncology

## 2016-08-30 DIAGNOSIS — C9000 Multiple myeloma not having achieved remission: Secondary | ICD-10-CM

## 2016-08-30 NOTE — Telephone Encounter (Signed)
Spoke with patient about up coming appointment on 8/10.

## 2016-09-05 ENCOUNTER — Ambulatory Visit (HOSPITAL_BASED_OUTPATIENT_CLINIC_OR_DEPARTMENT_OTHER): Payer: Medicaid Other

## 2016-09-05 ENCOUNTER — Telehealth: Payer: Self-pay | Admitting: *Deleted

## 2016-09-05 DIAGNOSIS — C9000 Multiple myeloma not having achieved remission: Secondary | ICD-10-CM | POA: Diagnosis not present

## 2016-09-05 MED ORDER — SODIUM CHLORIDE 0.9% FLUSH
10.0000 mL | Freq: Once | INTRAVENOUS | Status: AC
  Administered 2016-09-05: 10 mL
  Filled 2016-09-05: qty 10

## 2016-09-05 MED ORDER — HEPARIN SOD (PORK) LOCK FLUSH 100 UNIT/ML IV SOLN
500.0000 [IU] | Freq: Once | INTRAVENOUS | Status: AC
  Administered 2016-09-05: 500 [IU]
  Filled 2016-09-05: qty 5

## 2016-09-05 NOTE — Telephone Encounter (Signed)
TC from patient stating that he had a BMT last week @ [facility]. He has a port that is currently accessed for daily IV antibiotics.  Today is the 7th day he has had the needle in. He is asking if we can de-access his port and re-access this afternoon.  He does have an appt for labs tomorrow and Dr. Alvy Bimler. He wanted to know if he should wait until then.  Advised patient the protocol is for the needle and dressing to be changed every 7 days. He is agreeable to come in this afternoon. Urgent LOS sent for appt in [facility] 1 @ 4pm.  Flush nurse, Porsche made aware.

## 2016-09-06 ENCOUNTER — Telehealth: Payer: Self-pay | Admitting: Hematology and Oncology

## 2016-09-06 ENCOUNTER — Other Ambulatory Visit (HOSPITAL_BASED_OUTPATIENT_CLINIC_OR_DEPARTMENT_OTHER): Payer: Medicaid Other

## 2016-09-06 ENCOUNTER — Ambulatory Visit (HOSPITAL_BASED_OUTPATIENT_CLINIC_OR_DEPARTMENT_OTHER): Payer: Medicaid Other | Admitting: Hematology and Oncology

## 2016-09-06 DIAGNOSIS — C9000 Multiple myeloma not having achieved remission: Secondary | ICD-10-CM

## 2016-09-06 DIAGNOSIS — R7881 Bacteremia: Secondary | ICD-10-CM | POA: Insufficient documentation

## 2016-09-06 DIAGNOSIS — B9620 Unspecified Escherichia coli [E. coli] as the cause of diseases classified elsewhere: Secondary | ICD-10-CM | POA: Insufficient documentation

## 2016-09-06 DIAGNOSIS — D61818 Other pancytopenia: Secondary | ICD-10-CM

## 2016-09-06 DIAGNOSIS — G893 Neoplasm related pain (acute) (chronic): Secondary | ICD-10-CM

## 2016-09-06 LAB — COMPREHENSIVE METABOLIC PANEL
ALT: 43 U/L (ref 0–55)
AST: 34 U/L (ref 5–34)
Albumin: 3.5 g/dL (ref 3.5–5.0)
Alkaline Phosphatase: 44 U/L (ref 40–150)
Anion Gap: 6 mEq/L (ref 3–11)
BUN: 9.6 mg/dL (ref 7.0–26.0)
CO2: 27 meq/L (ref 22–29)
CREATININE: 0.8 mg/dL (ref 0.7–1.3)
Calcium: 9.3 mg/dL (ref 8.4–10.4)
Chloride: 107 mEq/L (ref 98–109)
EGFR: >90 mL/min/{1.73_m2} (ref >90)
Glucose: 137 mg/dl (ref 70–140)
Potassium: 3.7 mEq/L (ref 3.5–5.1)
SODIUM: 141 meq/L (ref 136–145)
Total Bilirubin: 0.48 mg/dL (ref 0.20–1.20)
Total Protein: 5.9 g/dL — ABNORMAL LOW (ref 6.4–8.3)

## 2016-09-06 LAB — MAGNESIUM: Magnesium: 2.5 mg/dl (ref 1.5–2.5)

## 2016-09-06 LAB — CBC WITH DIFFERENTIAL/PLATELET
BASO%: 0.9 % (ref 0.0–2.0)
Basophils Absolute: 0 10*3/uL (ref 0.0–0.1)
EOS%: 2.6 % (ref 0.0–7.0)
Eosinophils Absolute: 0.1 10*3/uL (ref 0.0–0.5)
HCT: 32.9 % — ABNORMAL LOW (ref 38.4–49.9)
HGB: 11.2 g/dL — ABNORMAL LOW (ref 13.0–17.1)
LYMPH#: 0.6 10*3/uL — AB (ref 0.9–3.3)
LYMPH%: 21.5 % (ref 14.0–49.0)
MCH: 31.7 pg (ref 27.2–33.4)
MCHC: 33.9 g/dL (ref 32.0–36.0)
MCV: 93.4 fL (ref 79.3–98.0)
MONO#: 0.8 10*3/uL (ref 0.1–0.9)
MONO%: 27.1 % — ABNORMAL HIGH (ref 0.0–14.0)
NEUT#: 1.4 10*3/uL — ABNORMAL LOW (ref 1.5–6.5)
NEUT%: 47.9 % (ref 39.0–75.0)
Platelets: 186 10*3/uL (ref 140–400)
RBC: 3.52 10*6/uL — AB (ref 4.20–5.82)
RDW: 13.9 % (ref 11.0–14.6)
WBC: 2.9 10*3/uL — ABNORMAL LOW (ref 4.0–10.3)

## 2016-09-06 NOTE — Assessment & Plan Note (Signed)
He has recent E. coli bacteremia He is receiving IV antibiotic treatment We will continue the same as directed by his physician at [facility]

## 2016-09-06 NOTE — Assessment & Plan Note (Signed)
He denies significant pain He is completely wean off his pain medicine since transplant Continue observation only

## 2016-09-06 NOTE — Progress Notes (Signed)
[place] OFFICE PROGRESS NOTE  Patient Care Team: [name], MD as PCP - General (Internal Medicine)  SUMMARY OF ONCOLOGIC HISTORY:   Multiple myeloma not having achieved remission [facility])   06/23/2015 - 06/28/2015 Hospital Admission    The patient was admitted to the hospital due to gait ataxia and back pain. He was subsequently found to have cord compression underwent surgery and was discharged home      06/24/2015 Imaging    Abnormal appearance of the T6 vertebral body, highly suspicious for possible osseous metastasis. Associated pathologic fracture withup to 30% height loss. There is associated abnormal soft tissue density within the ventral epidural space,      06/24/2015 Imaging    MRI lumbar: Focal osseous lesion with abnormal enhancement involving the right pedicle of L3, suspicious for possible osseous metastasisgiven the findings in the thoracic spine. Question additional focal lesion within the right iliac wing as above.        06/25/2015 Pathology Results    Accession: [id number] bone biopsy come from plasma cell neoplasm.      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 Zometa is not given due to inability to get dental clearance      12/14/2015 - 04/24/2016 Chemotherapy    He is started on maintenance treatment with Revlimid only      12/18/2015 Imaging    MRI thoracic and lumbar spine showed numerous enhancing foci throughout the thoracic and lumbar spine with several new small foci in the lumbar spine in comparison with prior MRI compatible with metastatic disease. Stable loss of height of the T3, T4, and T6 vertebral bodies and new postsurgical  changes related to T6 laminectomy. No significant epidural disease or evidence for cord compression. No abnormal enhancement of the spinal cord or cauda equina.      05/02/2016 Bone Marrow Biopsy    Outside bone marrow biopsy showed 20% myeloma involvement      05/16/2016 Procedure    Successful placement of a right internal jugular approach power injectable Port-A-Cath. The catheter is ready for immediate use.      05/21/2016 - 07/03/2016 Chemotherapy    He received Kyprolis, Cytoxan and dexamethasone       07/08/2016 Procedure    Status post CT-guided bone marrow biopsy, with tissue specimen sent to pathology for complete histopathologic analysis      07/08/2016 Bone Marrow Biopsy    Bone Marrow, Aspirate,Biopsy, and Clot BONE MARROW: - MILDLY HYPERCELLULAR MARROW (60%) WITH PLASMA CELL NEOPLASM - SEE COMMENT PERIPHERAL BLOOD: - NORMOCYTIC ANEMIA Diagnosis Note The marrow is hypercellular with lambda-restricted plasma cells consistent with persistence of the patient's previously diagnosed plasma cell neoplasm. The plasma cells comprise approximately 10-15% of the total marrow cellularity, are enlarged, and arranged in clusters.      08/14/2016 Miscellaneous    He received conditioning treatment with melphalan      08/15/2016 Bone Marrow Transplant    He received autologous stem cell transplant      08/24/2016 - 08/29/2016 Hospital Admission    His post-transplant course was complicated by E-Coli bacteremia       INTERVAL HISTORY: Please see below for  problem oriented charting. He returns for further follow-up He is doing well He is fully recover from majority of side effects from recent bone marrow transplant Denies recent fever or chills No nausea or vomiting He denies pain or peripheral neuropathy No new neurological deficit  REVIEW OF SYSTEMS:   Constitutional: Denies fevers, chills or abnormal weight loss Eyes: Denies blurriness of vision Ears, nose, mouth, throat,  and face: Denies mucositis or sore throat Respiratory: Denies cough, dyspnea or wheezes Cardiovascular: Denies palpitation, chest discomfort or lower extremity swelling Gastrointestinal:  Denies nausea, heartburn or change in bowel habits Skin: Denies abnormal skin rashes Lymphatics: Denies new lymphadenopathy or easy bruising Neurological:Denies numbness, tingling or new weaknesses Behavioral/Psych: Mood is stable, no new changes  All other systems were reviewed with the patient and are negative.  I have reviewed the past medical history, past surgical history, social history and family history with the patient and they are unchanged from previous note.  ALLERGIES:  has No Known Allergies.  MEDICATIONS:  Current Outpatient Prescriptions  Medication Sig Dispense Refill  . acyclovir (ZOVIRAX) 400 MG tablet Take 1 tablet (400 mg total) by mouth 2 (two) times daily. 60 tablet 11  . cholecalciferol (VITAMIN D) 1000 units tablet Take 1,000 Units by mouth daily.    Marland Kitchen lidocaine-prilocaine (EMLA) cream Apply to affected area once (Patient not taking: Reported on 09/06/2016) 30 g 3   No current facility-administered medications for this visit.     PHYSICAL EXAMINATION: ECOG PERFORMANCE STATUS: 1 - Symptomatic but completely ambulatory  Vitals:   09/06/16 1020  BP: 125/76  Pulse: 93  Resp: 18  Temp: 99 F (37.2 C)  SpO2: 100%   Filed Weights   09/06/16 1020  Weight: 221 lb 12.8 oz (100.6 kg)    GENERAL:alert, no distress and comfortable SKIN: skin color, texture, turgor are normal, no rashes or significant lesions EYES: normal, Conjunctiva are pink and non-injected, sclera clear OROPHARYNX:no exudate, no erythema and lips, buccal mucosa, and tongue normal  NECK: supple, thyroid normal size, non-tender, without nodularity LYMPH:  no palpable lymphadenopathy in the cervical, axillary or inguinal LUNGS: clear to auscultation and percussion with normal breathing effort HEART: regular  rate & rhythm and no murmurs and no lower extremity edema ABDOMEN:abdomen soft, non-tender and normal bowel sounds Musculoskeletal:no cyanosis of digits and no clubbing  NEURO: alert & oriented x 3 with fluent speech, no focal motor/sensory deficits  LABORATORY DATA:  I have reviewed the data as listed    Component Value Date/Time   NA 141 09/06/2016 1000   K 3.7 09/06/2016 1000   CL 110 07/20/2015 0755   CO2 27 09/06/2016 1000   GLUCOSE 137 09/06/2016 1000   BUN 9.6 09/06/2016 1000   CREATININE 0.8 09/06/2016 1000   CALCIUM 9.3 09/06/2016 1000   PROT 5.9 (L) 09/06/2016 1000   ALBUMIN 3.5 09/06/2016 1000   AST 34 09/06/2016 1000   ALT 43 09/06/2016 1000   ALKPHOS 44 09/06/2016 1000   BILITOT 0.48 09/06/2016 1000   GFRNONAA 49 (L) 07/20/2015 0755   GFRAA 56 (L) 07/20/2015 0755    No results found for: SPEP, UPEP  Lab Results  Component Value Date   WBC 2.9 (L) 09/06/2016   NEUTROABS 1.4 (L) 09/06/2016   HGB 11.2 (L) 09/06/2016   HCT 32.9 (L) 09/06/2016   MCV 93.4 09/06/2016   PLT 186 09/06/2016      Chemistry      Component Value Date/Time   NA 141 09/06/2016  1000   K 3.7 09/06/2016 1000   CL 110 07/20/2015 0755   CO2 27 09/06/2016 1000   BUN 9.6 09/06/2016 1000   CREATININE 0.8 09/06/2016 1000      Component Value Date/Time   CALCIUM 9.3 09/06/2016 1000   ALKPHOS 44 09/06/2016 1000   AST 34 09/06/2016 1000   ALT 43 09/06/2016 1000   BILITOT 0.48 09/06/2016 1000       ASSESSMENT & PLAN:  Multiple myeloma not having achieved remission ([place]) He is fully engrafted He has appointment to return back to [facility] next week for 30 days post transplant visit I will wait for further instruction related to the timing of maintenance treatment and the choice of chemotherapy  Pancytopenia, acquired ([place]) This is due to recent transplant He is not symptomatic No transfusion support is required He will continue antimicrobial therapy as directed  E coli  bacteremia He has recent E. coli bacteremia He is receiving IV antibiotic treatment We will continue the same as directed by his physician at [place] associated pain He denies significant pain He is completely wean off his pain medicine since transplant Continue observation only   No orders of the defined types were placed in this encounter.  All questions were answered. The patient knows to call the clinic with any problems, questions or concerns. No barriers to learning was detected. I spent 20 minutes counseling the patient face to face. The total time spent in the appointment was 25 minutes and more than 50% was on counseling and review of test results     [name], MD 09/06/2016 6:05 PM

## 2016-09-06 NOTE — Assessment & Plan Note (Signed)
This is due to recent transplant He is not symptomatic No transfusion support is required He will continue antimicrobial therapy as directed

## 2016-09-06 NOTE — Telephone Encounter (Signed)
Spoke with patient about possibly calling him about new appts.

## 2016-09-06 NOTE — Assessment & Plan Note (Signed)
He is fully engrafted He has appointment to return back to [facility] next week for 30 days post transplant visit I will wait for further instruction related to the timing of maintenance treatment and the choice of chemotherapy

## 2016-09-10 MED FILL — GABAPENTIN 300 MG CAPSULE: 300 | 30 days supply | Qty: 90 | Fill #1

## 2016-09-13 ENCOUNTER — Telehealth: Payer: Self-pay

## 2016-09-13 ENCOUNTER — Other Ambulatory Visit (HOSPITAL_BASED_OUTPATIENT_CLINIC_OR_DEPARTMENT_OTHER): Payer: Medicaid Other

## 2016-09-13 DIAGNOSIS — C9000 Multiple myeloma not having achieved remission: Secondary | ICD-10-CM

## 2016-09-13 LAB — CBC WITH DIFFERENTIAL/PLATELET
BASO%: 1.1 % (ref 0.0–2.0)
Basophils Absolute: 0 10*3/uL (ref 0.0–0.1)
EOS ABS: 0.3 10*3/uL (ref 0.0–0.5)
EOS%: 8.3 % — ABNORMAL HIGH (ref 0.0–7.0)
HCT: 35.4 % — ABNORMAL LOW (ref 38.4–49.9)
HGB: 12.1 g/dL — ABNORMAL LOW (ref 13.0–17.1)
LYMPH#: 0.7 10*3/uL — AB (ref 0.9–3.3)
LYMPH%: 19.6 % (ref 14.0–49.0)
MCH: 32.3 pg (ref 27.2–33.4)
MCHC: 34.2 g/dL (ref 32.0–36.0)
MCV: 94.4 fL (ref 79.3–98.0)
MONO#: 0.8 10*3/uL (ref 0.1–0.9)
MONO%: 21.4 % — ABNORMAL HIGH (ref 0.0–14.0)
NEUT#: 1.7 10*3/uL (ref 1.5–6.5)
NEUT%: 49.6 % (ref 39.0–75.0)
PLATELETS: 214 10*3/uL (ref 140–400)
RBC: 3.75 10*6/uL — AB (ref 4.20–5.82)
RDW: 15.2 % — ABNORMAL HIGH (ref 11.0–14.6)
WBC: 3.5 10*3/uL — ABNORMAL LOW (ref 4.0–10.3)

## 2016-09-13 LAB — MAGNESIUM: Magnesium: 2.4 mg/dl (ref 1.5–2.5)

## 2016-09-13 LAB — COMPREHENSIVE METABOLIC PANEL
ALT: 53 U/L (ref 0–55)
ANION GAP: 6 meq/L (ref 3–11)
AST: 38 U/L — ABNORMAL HIGH (ref 5–34)
Albumin: 3.7 g/dL (ref 3.5–5.0)
Alkaline Phosphatase: 40 U/L (ref 40–150)
BUN: 9.7 mg/dL (ref 7.0–26.0)
CHLORIDE: 108 meq/L (ref 98–109)
CO2: 28 meq/L (ref 22–29)
Calcium: 9.7 mg/dL (ref 8.4–10.4)
Creatinine: 0.9 mg/dL (ref 0.7–1.3)
Glucose: 63 mg/dl — ABNORMAL LOW (ref 70–140)
Potassium: 3.7 mEq/L (ref 3.5–5.1)
Sodium: 142 mEq/L (ref 136–145)
Total Bilirubin: 0.71 mg/dL (ref 0.20–1.20)
Total Protein: 6.2 g/dL — ABNORMAL LOW (ref 6.4–8.3)

## 2016-09-13 NOTE — Telephone Encounter (Signed)
-----   Message from [name], MD sent at 09/13/2016 12:19 PM EDT ----- Regarding: labs OK Labs ok, let hiim know Fax results to [facility] ----- Message ----- From: Interface, Lab In Three Zero One Sent: 09/13/2016  11:46 AM To: [name], MD

## 2016-09-13 NOTE — Telephone Encounter (Signed)
Called patient with below message and faxed copy of labs to [facility].

## 2016-09-20 ENCOUNTER — Other Ambulatory Visit (HOSPITAL_BASED_OUTPATIENT_CLINIC_OR_DEPARTMENT_OTHER): Payer: Medicaid Other

## 2016-09-20 DIAGNOSIS — C9000 Multiple myeloma not having achieved remission: Secondary | ICD-10-CM | POA: Diagnosis present

## 2016-09-20 LAB — COMPREHENSIVE METABOLIC PANEL
ALT: 30 U/L (ref 0–55)
AST: 25 U/L (ref 5–34)
Albumin: 3.7 g/dL (ref 3.5–5.0)
Alkaline Phosphatase: 36 U/L — ABNORMAL LOW (ref 40–150)
Anion Gap: 6 mEq/L (ref 3–11)
BUN: 12.7 mg/dL (ref 7.0–26.0)
CALCIUM: 9.6 mg/dL (ref 8.4–10.4)
CHLORIDE: 109 meq/L (ref 98–109)
CO2: 27 mEq/L (ref 22–29)
CREATININE: 0.9 mg/dL (ref 0.7–1.3)
EGFR: >90 mL/min/{1.73_m2} (ref >90)
GLUCOSE: 75 mg/dL (ref 70–140)
POTASSIUM: 3.6 meq/L (ref 3.5–5.1)
SODIUM: 142 meq/L (ref 136–145)
Total Bilirubin: 0.62 mg/dL (ref 0.20–1.20)
Total Protein: 6.2 g/dL — ABNORMAL LOW (ref 6.4–8.3)

## 2016-09-20 LAB — CBC WITH DIFFERENTIAL/PLATELET
BASO%: 2.4 % — AB (ref 0.0–2.0)
BASOS ABS: 0.1 10*3/uL (ref 0.0–0.1)
EOS%: 14.9 % — AB (ref 0.0–7.0)
Eosinophils Absolute: 0.5 10*3/uL (ref 0.0–0.5)
HEMATOCRIT: 35.2 % — AB (ref 38.4–49.9)
HEMOGLOBIN: 12 g/dL — AB (ref 13.0–17.1)
LYMPH#: 0.7 10*3/uL — AB (ref 0.9–3.3)
LYMPH%: 22.9 % (ref 14.0–49.0)
MCH: 32.1 pg (ref 27.2–33.4)
MCHC: 34 g/dL (ref 32.0–36.0)
MCV: 94.4 fL (ref 79.3–98.0)
MONO#: 0.7 10*3/uL (ref 0.1–0.9)
MONO%: 21.1 % — AB (ref 0.0–14.0)
NEUT#: 1.2 10*3/uL — ABNORMAL LOW (ref 1.5–6.5)
NEUT%: 38.7 % — AB (ref 39.0–75.0)
Platelets: 191 10*3/uL (ref 140–400)
RBC: 3.73 10*6/uL — ABNORMAL LOW (ref 4.20–5.82)
RDW: 15 % — ABNORMAL HIGH (ref 11.0–14.6)
WBC: 3.2 10*3/uL — ABNORMAL LOW (ref 4.0–10.3)

## 2016-09-20 LAB — MAGNESIUM: Magnesium: 2 mg/dl (ref 1.5–2.5)

## 2016-11-25 ENCOUNTER — Other Ambulatory Visit: Payer: Self-pay | Admitting: Hematology and Oncology

## 2016-11-25 ENCOUNTER — Telehealth: Payer: Self-pay

## 2016-11-25 DIAGNOSIS — C9000 Multiple myeloma not having achieved remission: Secondary | ICD-10-CM

## 2016-11-25 NOTE — Telephone Encounter (Signed)
Called with below message. He will get bone marrow biopsy at [facility] without sedation per [place].  We can cancel the one ordered at [facility].

## 2016-11-25 NOTE — Telephone Encounter (Signed)
-----  Message from [name], MD sent at 11/25/2016  2:57 PM EDT ----- Regarding: RE: BONE MARROW BIOPSY Thanks, Vivien Rota, I will get back to you Hassan Rowan, please note. Please call [name] to see if that is acceptable? ----- Message ----- From: Roosvelt Maser Sent: 11/25/2016   2:23 PM To: [name], MD Subject: BONE MARROW BIOPSY                              Dr. Alvy Bimler,  I called this patient to schedule his bone marrow biopsy and our 1st available appointment is 12/04/16 and he said to let you know because he wants this before the 6th.  I don't have anything before the 6th?  Is it ok to do this on the 7th?  Thank you, [facility] Radiology scheduler

## 2016-11-25 NOTE — Telephone Encounter (Signed)
Called [place] back with below message.

## 2016-11-25 NOTE — Telephone Encounter (Signed)
I placed order for CT guided bone marrow biopsy, asking for 11/5. No guarantee it can be done by then

## 2016-11-25 NOTE — Telephone Encounter (Signed)
Bradley Hunt with [facility] BMT called and left message. Patient came in today for bone marrow with biopsy. He wants to come in to [facility] or [facility] for bone marrow with biopsy under sedation. It needs to be performed no later than 11/6. Bradley Hunt ask if Dr. Alvy Bimler can assist with this. Please call back and she will fax over info.

## 2016-12-24 ENCOUNTER — Telehealth: Payer: Self-pay | Admitting: Hematology and Oncology

## 2016-12-24 ENCOUNTER — Ambulatory Visit (HOSPITAL_BASED_OUTPATIENT_CLINIC_OR_DEPARTMENT_OTHER): Payer: Medicaid Other | Admitting: Hematology and Oncology

## 2016-12-24 VITALS — BP 126/81 | HR 81 | Temp 98.0°F | Resp 20 | Ht 74.0 in | Wt 231.5 lb

## 2016-12-24 DIAGNOSIS — G893 Neoplasm related pain (acute) (chronic): Secondary | ICD-10-CM | POA: Diagnosis not present

## 2016-12-24 DIAGNOSIS — Z7189 Other specified counseling: Secondary | ICD-10-CM

## 2016-12-24 DIAGNOSIS — C9000 Multiple myeloma not having achieved remission: Secondary | ICD-10-CM | POA: Diagnosis not present

## 2016-12-24 MED ORDER — POMALIDOMIDE 3 MG PO CAPS: 3.0000 mg | ORAL_CAPSULE | Freq: Every day | ORAL | 11 refills | Status: DC

## 2016-12-24 NOTE — Progress Notes (Signed)
DISCONTINUE ON PATHWAY REGIMEN - Multiple Myeloma     A cycle is every 28 days:     Dexamethasone      Cyclophosphamide      Carfilzomib      Carfilzomib      Carfilzomib   **Always confirm dose/schedule in your pharmacy ordering system**    REASON: Disease Progression PRIOR TREATMENT: [id number]: KCd (Carfilzomib 20/36 mg/m2 + Cyclophosphamide PO 500 mg + Dexamethasone PO 40 mg) q28 Days Until Progression or Unacceptable Toxicity TREATMENT RESPONSE: Progressive Disease (PD)  START ON PATHWAY REGIMEN - Multiple Myeloma and Other Plasma Cell Dyscrasias     A cycle is every 28 days:     Daratumumab      Pomalidomide      Dexamethasone      Dexamethasone   **Always confirm dose/schedule in your pharmacy ordering system**    Patient Characteristics: Relapsed / Refractory, All Lines of Therapy R-ISS Staging: III Disease Classification: Relapsed Line of Therapy: Second Line Intent of Therapy: Non-Curative / Palliative Intent, Discussed with Patient

## 2016-12-24 NOTE — Telephone Encounter (Signed)
Gave patient avs report and appointments for December  °

## 2016-12-26 ENCOUNTER — Telehealth: Payer: Self-pay | Admitting: Pharmacist

## 2016-12-26 ENCOUNTER — Other Ambulatory Visit: Payer: Self-pay | Admitting: Hematology and Oncology

## 2016-12-26 DIAGNOSIS — C9000 Multiple myeloma not having achieved remission: Secondary | ICD-10-CM

## 2016-12-26 MED ORDER — POMALIDOMIDE 3 MG PO CAPS: 3.0000 mg | ORAL_CAPSULE | Freq: Every day | ORAL | 0 refills | Status: DC

## 2016-12-26 NOTE — Telephone Encounter (Signed)
Oral Oncology Pharmacist Encounter  Received new prescription for Pomalyst (pomalidomide) for the treatment of relapsed multiple myeloma in conjunction with daratumumab and dexamethasone, planned duration until diease progression or unacceptable toxicity. Pomalyst will be given at 36m by mouth once daily for 21 days on, 7 days off, repeat every 28 days. Dexamethasone will be given at 230monce weekly. Daratumumab will be infused at 16 mg/kg once weekly x 8, then every 2 weeks x 8, then every 4 weeks until discontinuation.  [place]  Current medication list in Epic reviewed, no DDIs with regimen identified. Noted patient on aspirin for thromboprophylaxis. Patient continues on bactrim and acyclovir for infection prophylaxis [facility].  Pres[place] and [place] as Pomalyst is a limited distribution medication.  Oral Oncology Clinic will continue to follow for insurance authorization, copayment issues, initial counseling and start date.  JeJohny DrillingPharmD, BCPS, BCOP 12/26/2016 8:43 AM Oral Oncology Clinic [phone]

## 2016-12-26 NOTE — Telephone Encounter (Signed)
Oral Oncology Pharmacist Encounter  LVM for patient with update about Pomalyst prescription to [facility] (ph: [phone]). Provided phone number to dispensing pharmacy and Oral Oncology Clinic on voicemail.  Oral Oncology Clinic will continue to follow.  Johny Drilling, PharmD, BCPS, BCOP 12/26/2016 9:11 AM Oral Oncology Clinic [phone]

## 2016-12-27 ENCOUNTER — Encounter: Payer: Self-pay | Admitting: Hematology and Oncology

## 2016-12-27 NOTE — Assessment & Plan Note (Signed)
The patient is aware he has incurable disease and treatment is strictly palliative. 

## 2016-12-27 NOTE — Assessment & Plan Note (Signed)
He had persistent cancer back pain He felt better while on oxycodone pain medicine We discussed chronic pain management and narcotic refill policy He is reminded about the risk of sedation, nausea and constipation

## 2016-12-27 NOTE — Assessment & Plan Note (Signed)
Unfortunately, recent PET/CT scan and bone marrow biopsy performed at [facility] revealed myeloma relapse I recommend starting him on new therapy. He is mildly symptomatic with back/neck pain Recommend low-dose dexamethasone daily for now Due to scheduling issue and patient preference, ultimately, we agreed to start him on treatment on January 06, 2017 I plan to switch him over to dexamethasone, Pomalyst and daratumumab. This is based on recent FDA approval and publications as follows  We discussed the role of treatment is strictly palliative  Daratumumab plus pomalidomide and dexamethasone in relapsed and/or refractory multiple myeloma Jacklynn Barnacle, Clare Charon, Angeline Slim, Ellis Parents, York Pellant, [place] Ifthikharuddin, Pernell Dupre. Theresia Majors, Rhetta Mura Comenzo, Jianping Wang, Kerri Nottage, Audry Riles, Nushmia ZDelories Heinz, Lynetta Mare and Almyra Free  Blood 2017 :[id number]; doi: [email or web address]   Daratumumab plus pomalidomide/dexamethasone (pom-dex) was evaluated in patients with relapsed/refractory multiple myeloma with ?2 prior lines of therapy, and who were refractory to their last treatment.   Patients received daratumumab 16 mg/kg at the recommended dosing schedule, pomalidomide 4 mg daily for 21 days of each 28-day cycle, and dexamethasone 40 mg weekly. Safety was the primary endpoint.   Overall response rate (ORR) and minimal residual disease (MRD) by next-generation sequencing were secondary endpoints. Patients (N = 103) received a median (range) of 4 (1-13) prior therapies; 76% received ?3 prior therapies. The safety profile of daratumumab plus pom-dex was similar to that of pom-dex alone, with the exception of daratumumab-specific infusion-related reactions (50%) and a higher incidence of neutropenia, although without an increase in infection rate.  Common grade ?3 adverse events were neutropenia (78%), anemia (28%), and leukopenia (24%). ORR was 60% and was generally consistent across subgroups (58% in double-refractory patients).   Among patients with a complete response or better, 29% were MRD-negative at a threshold of 10-5. Among the 62 responders, median duration of response was not estimable (NE; 95% CI, 13.6-NE). At a median follow-up of 13.1 months, the median progression-free survival was 8.8 (95% CI, 4.6-15.4) months and median overall survival was 17.5 (95% CI, 13.3-NE) months. The estimated 4-monthsurvival rate was 66% (95% CI, 55.6-74.8). Aside from increased neutropenia, the safety profile of daratumumab plus pom-dex was consistent with that of the individual therapies. Deep, durable responses were observed in heavily treated patients  Infusion reactions are common side effects.  Some of the short term side-effects included, though not limited to, risk of fatigue, weight loss, tumor lysis syndrome, risk of allergic reactions, pancytopenia, risk of blood clots, life-threatening infections, need for transfusions of blood products, admission to hospital for various reasons, and risks of death.   The patient is aware that the response rates discussed earlier is not guaranteed.    After a long discussion, patient made an informed decision to proceed with the prescribed plan of care.   I will enlist the help of pharmacist for insurance prior authorization and payment assistant for Pomalyst Starting January 05, 2017, he will start high-dose pulsed dexamethasone at 20 mg every Sundays He will get daratumumab weekly every Mondays and I will see him on the second week of treatment for toxicity review He is reminded to take calcium with vitamin D supplement We will resume Zometa after dental clearance He is reminded to take antimicrobial prophylaxis [facility] reminded to take aspirin for DVT  prophylaxis

## 2016-12-27 NOTE — Progress Notes (Signed)
[place] OFFICE PROGRESS NOTE  Patient Care Team: [name], MD as PCP - General (Internal Medicine)  SUMMARY OF ONCOLOGIC HISTORY:   Multiple myeloma not having achieved remission [facility])   06/23/2015 - 06/28/2015 Hospital Admission    The patient was admitted to the hospital due to gait ataxia and back pain. He was subsequently found to have cord compression underwent surgery and was discharged home      06/24/2015 Imaging    Abnormal appearance of the T6 vertebral body, highly suspicious for possible osseous metastasis. Associated pathologic fracture withup to 30% height loss. There is associated abnormal soft tissue density within the ventral epidural space,      06/24/2015 Imaging    MRI lumbar: Focal osseous lesion with abnormal enhancement involving the right pedicle of L3, suspicious for possible osseous metastasisgiven the findings in the thoracic spine. Question additional focal lesion within the right iliac wing as above.        06/25/2015 Pathology Results    Accession: [id number] bone biopsy come from plasma cell neoplasm.      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 Zometa is not given due to inability to get dental clearance      12/14/2015 - 04/24/2016 Chemotherapy    He is started on maintenance treatment with Revlimid only      12/18/2015 Imaging    MRI thoracic and lumbar spine showed numerous enhancing foci throughout the thoracic and lumbar spine with several new small foci in the lumbar spine in comparison with prior MRI compatible with metastatic disease. Stable loss of height of the T3, T4, and T6 vertebral bodies and new postsurgical  changes related to T6 laminectomy. No significant epidural disease or evidence for cord compression. No abnormal enhancement of the spinal cord or cauda equina.      05/02/2016 Bone Marrow Biopsy    Outside bone marrow biopsy showed 20% myeloma involvement      05/16/2016 Procedure    Successful placement of a right internal jugular approach power injectable Port-A-Cath. The catheter is ready for immediate use.      05/21/2016 - 07/03/2016 Chemotherapy    He received Kyprolis, Cytoxan and dexamethasone       07/08/2016 Procedure    Status post CT-guided bone marrow biopsy, with tissue specimen sent to pathology for complete histopathologic analysis      07/08/2016 Bone Marrow Biopsy    Bone Marrow, Aspirate,Biopsy, and Clot BONE MARROW: - MILDLY HYPERCELLULAR MARROW (60%) WITH PLASMA CELL NEOPLASM - SEE COMMENT PERIPHERAL BLOOD: - NORMOCYTIC ANEMIA Diagnosis Note The marrow is hypercellular with lambda-restricted plasma cells consistent with persistence of the patient's previously diagnosed plasma cell neoplasm. The plasma cells comprise approximately 10-15% of the total marrow cellularity, are enlarged, and arranged in clusters.      08/14/2016 Miscellaneous    He received conditioning treatment with melphalan      08/15/2016 Bone Marrow Transplant    He received autologous stem cell transplant      08/24/2016 - 08/29/2016 Hospital Admission    His post-transplant course was complicated by E-Coli bacteremia      11/26/2016 PET scan    PET  CT at [facility] 1. Technically limited study due to soft tissue uptake. 2. New hypermetabolic uptake at C7 spinous process and left proximal femur that is of questionable significance in absence of underlying CT correlate. Further assessment with whole body bone scan may be considered. 2. Redemonstrated nonhypermetabolic multifocal lucent and sclerotic lesions throughout the spine which are similar to prior.       12/02/2016 Bone Marrow Biopsy     He had repeat bone marrow biopsy at [facility] An immunohistochemical stain for CD138 is performed on the bone marrow core biopsy demonstrates increased plasma cells with focal clustering (10-20% overall), which are monotypic for lambda light chain by in situ hybridization.       INTERVAL HISTORY: Please see below for problem oriented charting. He returns for further follow-up. Recent evaluation at [facility] review myeloma relapse He is mildly symptomatic with new onset of lower neck pain and back pain It is not debilitating He was prescribed oxycodone with adequate pain relief He denies new neurological deficits Denies recent worsening peripheral neuropathy No recent infection.  REVIEW OF SYSTEMS:   Constitutional: Denies fevers, chills or abnormal weight loss Eyes: Denies blurriness of vision Ears, nose, mouth, throat, and face: Denies mucositis or sore throat Respiratory: Denies cough, dyspnea or wheezes Cardiovascular: Denies palpitation, chest discomfort or lower extremity swelling Gastrointestinal:  Denies nausea, heartburn or change in bowel habits Skin: Denies abnormal skin rashes Lymphatics: Denies new lymphadenopathy or easy bruising Neurological:Denies numbness, tingling or new weaknesses Behavioral/Psych: Mood is stable, no new changes  All other systems were reviewed with the patient and are negative.  I have reviewed the past medical history, past surgical history, social history and family history with the patient and they are unchanged from previous note.  ALLERGIES:  has No Known Allergies.  MEDICATIONS:  Current Outpatient Medications  Medication Sig Dispense Refill  . aspirin EC 81 MG tablet Take 81 mg by mouth daily.    Marland Kitchen sulfamethoxazole-trimethoprim (BACTRIM DS,SEPTRA DS) 800-160 MG tablet Take 1 tablet by mouth 3 (three) times a week.    Marland Kitchen acyclovir (ZOVIRAX) 400 MG tablet Take 1 tablet (400 mg total) by mouth 2 (two) times daily. 60  tablet 11  . acyclovir (ZOVIRAX) 800 MG tablet Take 800 mg by mouth 2 (two) times daily.  10  . cholecalciferol (VITAMIN D) 1000 units tablet Take 1,000 Units by mouth daily.    . Cholecalciferol (VITAMIN D-1000 MAX ST) 1000 units tablet Take 1,000 Units by mouth daily.    Marland Kitchen dexamethasone (DECADRON) 4 MG tablet Take 20 mg by mouth once a week.  0  . lidocaine-prilocaine (EMLA) cream Apply to affected area once (Patient not taking: Reported on 09/06/2016) 30 g 3  . Oxycodone HCl 10 MG TABS Take 10 mg by mouth every 6 (six) hours as needed.  0  . pantoprazole (PROTONIX) 40 MG tablet Take 40 mg by mouth daily.  0  . pomalidomide (POMALYST) 3 MG capsule Take 1 capsule (3 mg total) by mouth daily. Take with water for 21 days on,7 days off. Repeat every 28 days. 21 capsule 0   No current facility-administered medications for this visit.     PHYSICAL EXAMINATION: ECOG PERFORMANCE STATUS: 1 - Symptomatic but completely ambulatory  Vitals:   12/24/16 1354  BP: 126/81  Pulse: 81  Resp: 20  Temp: 98 F (36.7 C)  SpO2: 99%   Filed Weights   12/24/16 1354  Weight: 231 lb 8 oz (105 kg)  GENERAL:alert, no distress and comfortable SKIN: skin color, texture, turgor are normal, no rashes or significant lesions EYES: normal, Conjunctiva are pink and non-injected, sclera clear OROPHARYNX:no exudate, no erythema and lips, buccal mucosa, and tongue normal  NECK: supple, thyroid normal size, non-tender, without nodularity LYMPH:  no palpable lymphadenopathy in the cervical, axillary or inguinal LUNGS: clear to auscultation and percussion with normal breathing effort HEART: regular rate & rhythm and no murmurs and no lower extremity edema ABDOMEN:abdomen soft, non-tender and normal bowel sounds Musculoskeletal:no cyanosis of digits and no clubbing  NEURO: alert & oriented x 3 with fluent speech, no focal motor/sensory deficits  LABORATORY DATA:  I have reviewed the data as listed    Component  Value Date/Time   NA 142 09/20/2016 1141   K 3.6 09/20/2016 1141   CL 110 07/20/2015 0755   CO2 27 09/20/2016 1141   GLUCOSE 75 09/20/2016 1141   BUN 12.7 09/20/2016 1141   CREATININE 0.9 09/20/2016 1141   CALCIUM 9.6 09/20/2016 1141   PROT 6.2 (L) 09/20/2016 1141   ALBUMIN 3.7 09/20/2016 1141   AST 25 09/20/2016 1141   ALT 30 09/20/2016 1141   ALKPHOS 36 (L) 09/20/2016 1141   BILITOT 0.62 09/20/2016 1141   GFRNONAA 49 (L) 07/20/2015 0755   GFRAA 56 (L) 07/20/2015 0755    No results found for: SPEP, UPEP  Lab Results  Component Value Date   WBC 3.2 (L) 09/20/2016   NEUTROABS 1.2 (L) 09/20/2016   HGB 12.0 (L) 09/20/2016   HCT 35.2 (L) 09/20/2016   MCV 94.4 09/20/2016   PLT 191 09/20/2016      Chemistry      Component Value Date/Time   NA 142 09/20/2016 1141   K 3.6 09/20/2016 1141   CL 110 07/20/2015 0755   CO2 27 09/20/2016 1141   BUN 12.7 09/20/2016 1141   CREATININE 0.9 09/20/2016 1141      Component Value Date/Time   CALCIUM 9.6 09/20/2016 1141   ALKPHOS 36 (L) 09/20/2016 1141   AST 25 09/20/2016 1141   ALT 30 09/20/2016 1141   BILITOT 0.62 09/20/2016 1141       ASSESSMENT & PLAN:  Multiple myeloma not having achieved remission ([place]) Unfortunately, recent PET/CT scan and bone marrow biopsy performed at [facility] revealed myeloma relapse I recommend starting him on new therapy. He is mildly symptomatic with back/neck pain Recommend low-dose dexamethasone daily for now Due to scheduling issue and patient preference, ultimately, we agreed to start him on treatment on January 06, 2017 I plan to switch him over to dexamethasone, Pomalyst and daratumumab. This is based on recent FDA approval and publications as follows  We discussed the role of treatment is strictly palliative  Daratumumab plus pomalidomide and dexamethasone in relapsed and/or refractory multiple myeloma Jacklynn Barnacle, Clare Charon, Angeline Slim, Ellis Parents, York Pellant, [place] Ifthikharuddin, Pernell Dupre. Theresia Majors, Rhetta Mura Comenzo, Jianping Wang, Kerri Nottage, Audry Riles, Nushmia ZDelories Heinz, Lynetta Mare and Almyra Free  Blood 2017 :blood-[id number]; doi: [email or web address]   Daratumumab plus pomalidomide/dexamethasone (pom-dex) was evaluated in patients with relapsed/refractory multiple myeloma with ?2 prior lines of therapy, and who were refractory to their last treatment.   Patients received daratumumab 16 mg/kg at the recommended dosing schedule, pomalidomide 4 mg daily for 21 days of each 28-day cycle, and dexamethasone 40 mg weekly. Safety was the primary endpoint.   Overall response rate (ORR) and minimal residual disease (MRD)  by next-generation sequencing were secondary endpoints. Patients (N = 103) received a median (range) of 4 (1-13) prior therapies; 76% received ?3 prior therapies. The safety profile of daratumumab plus pom-dex was similar to that of pom-dex alone, with the exception of daratumumab-specific infusion-related reactions (50%) and a higher incidence of neutropenia, although without an increase in infection rate. Common grade ?3 adverse events were neutropenia (78%), anemia (28%), and leukopenia (24%). ORR was 60% and was generally consistent across subgroups (58% in double-refractory patients).   Among patients with a complete response or better, 29% were MRD-negative at a threshold of 10-5. Among the 62 responders, median duration of response was not estimable (NE; 95% CI, 13.6-NE). At a median follow-up of 13.1 months, the median progression-free survival was 8.8 (95% CI, 4.6-15.4) months and median overall survival was 17.5 (95% CI, 13.3-NE) months. The estimated 16-monthsurvival rate was 66% (95% CI, 55.6-74.8). Aside from increased neutropenia, the safety profile of daratumumab plus pom-dex was consistent with that of the individual  therapies. Deep, durable responses were observed in heavily treated patients  Infusion reactions are common side effects.  Some of the short term side-effects included, though not limited to, risk of fatigue, weight loss, tumor lysis syndrome, risk of allergic reactions, pancytopenia, risk of blood clots, life-threatening infections, need for transfusions of blood products, admission to hospital for various reasons, and risks of death.   The patient is aware that the response rates discussed earlier is not guaranteed.    After a long discussion, patient made an informed decision to proceed with the prescribed plan of care.   I will enlist the help of pharmacist for insurance prior authorization and payment assistant for Pomalyst Starting January 05, 2017, he will start high-dose pulsed dexamethasone at 20 mg every Sundays He will get daratumumab weekly every Mondays and I will see him on the second week of treatment for toxicity review He is reminded to take calcium with vitamin D supplement We will resume Zometa after dental clearance He is reminded to take antimicrobial [facility] is reminded to take aspirin for DVT prophylaxis  Cancer associated pain He had persistent cancer back pain He felt better while on oxycodone pain medicine We discussed chronic pain management and narcotic refill policy He is reminded about the risk of sedation, nausea and constipation  Goals of care, counseling/discussion The patient is aware he has incurable disease and treatment is strictly palliative.   No orders of the defined types were placed in this encounter.  All questions were answered. The patient knows to call the clinic with any problems, questions or concerns. No barriers to learning was detected. I spent 40 minutes counseling the patient face to face. The total time spent in the appointment was 60 minutes and more than 50% was on counseling  and review of test results     NHeath Lark MD 12/27/2016 4:40 PM

## 2017-01-01 NOTE — Telephone Encounter (Signed)
Please remind him to take Pomalyst same day as chemo on 12/10

## 2017-01-01 NOTE — Telephone Encounter (Signed)
Pt called asking when to start his pomalyst. Gave him information to start on 12/10.

## 2017-01-01 NOTE — Telephone Encounter (Signed)
LM with message below 

## 2017-01-01 NOTE — Telephone Encounter (Signed)
Oral Oncology Patient Advocate Encounter  Confirmed with [place] that the initial fill of Pomalyst was shipped to the patient's home on 12/30/2016.   Fabio Asa. Melynda Keller, [place] Patient Maish Vaya [phone] 01/01/2017 9:15 AM

## 2017-01-03 ENCOUNTER — Other Ambulatory Visit (HOSPITAL_BASED_OUTPATIENT_CLINIC_OR_DEPARTMENT_OTHER): Payer: Medicaid Other

## 2017-01-03 DIAGNOSIS — C9000 Multiple myeloma not having achieved remission: Secondary | ICD-10-CM | POA: Diagnosis present

## 2017-01-03 LAB — CBC WITH DIFFERENTIAL/PLATELET
BASO%: 0.2 % (ref 0.0–2.0)
BASOS ABS: 0 10*3/uL (ref 0.0–0.1)
EOS ABS: 0.3 10*3/uL (ref 0.0–0.5)
EOS%: 5.2 % (ref 0.0–7.0)
HCT: 40 % (ref 38.4–49.9)
HGB: 13.5 g/dL (ref 13.0–17.1)
LYMPH%: 30.7 % (ref 14.0–49.0)
MCH: 31 pg (ref 27.2–33.4)
MCHC: 33.8 g/dL (ref 32.0–36.0)
MCV: 92 fL (ref 79.3–98.0)
MONO#: 0.4 10*3/uL (ref 0.1–0.9)
MONO%: 6.9 % (ref 0.0–14.0)
NEUT#: 3 10*3/uL (ref 1.5–6.5)
NEUT%: 57 % (ref 39.0–75.0)
Platelets: 158 10*3/uL (ref 140–400)
RBC: 4.35 10*6/uL (ref 4.20–5.82)
RDW: 13.3 % (ref 11.0–14.6)
WBC: 5.2 10*3/uL (ref 4.0–10.3)
lymph#: 1.6 10*3/uL (ref 0.9–3.3)

## 2017-01-03 LAB — COMPREHENSIVE METABOLIC PANEL
ALT: 19 U/L (ref 0–55)
AST: 15 U/L (ref 5–34)
Albumin: 4 g/dL (ref 3.5–5.0)
Alkaline Phosphatase: 38 U/L — ABNORMAL LOW (ref 40–150)
Anion Gap: 8 mEq/L (ref 3–11)
BUN: 20.8 mg/dL (ref 7.0–26.0)
CALCIUM: 9.3 mg/dL (ref 8.4–10.4)
CHLORIDE: 107 meq/L (ref 98–109)
CO2: 26 meq/L (ref 22–29)
CREATININE: 1 mg/dL (ref 0.7–1.3)
EGFR: >60 mL/min/{1.73_m2} (ref >60)
GLUCOSE: 83 mg/dL (ref 70–140)
Potassium: 3.7 mEq/L (ref 3.5–5.1)
Sodium: 142 mEq/L (ref 136–145)
Total Bilirubin: 0.62 mg/dL (ref 0.20–1.20)
Total Protein: 6.4 g/dL (ref 6.4–8.3)

## 2017-01-06 ENCOUNTER — Ambulatory Visit: Payer: Medicaid Other

## 2017-01-07 ENCOUNTER — Telehealth: Payer: Self-pay | Admitting: *Deleted

## 2017-01-07 NOTE — Telephone Encounter (Signed)
We are cancelling Wednesday's appt- will continue with Mondays.

## 2017-01-07 NOTE — Telephone Encounter (Signed)
-----   Message from [name], MD sent at 01/07/2017 11:26 AM EST ----- Regarding: RE: pt rescheduling - dara I will ask Takenya Travaglini to call him whether he wants to switch to Wednesdays It might be best to just leave him on Mondays which mean cancel tomorrow We will reschedule accordingly ----- Message ----- From: Claude Manges, RN Sent: 01/07/2017  10:02 AM To: [name], MD Subject: pt rescheduling - dara                         Dr. Alvy Bimler,  Rene Paci r/s [facility] to Wed (tomorrow), and I noticed that his next dara will be on Mondays.  Please let me know if I need to make any changes in his scheduling.  Benjamine Mola

## 2017-01-08 ENCOUNTER — Ambulatory Visit: Payer: Medicaid Other

## 2017-01-08 ENCOUNTER — Other Ambulatory Visit: Payer: Self-pay | Admitting: Hematology and Oncology

## 2017-01-08 DIAGNOSIS — C9000 Multiple myeloma not having achieved remission: Secondary | ICD-10-CM

## 2017-01-13 ENCOUNTER — Other Ambulatory Visit: Payer: Self-pay | Admitting: Hematology and Oncology

## 2017-01-13 ENCOUNTER — Other Ambulatory Visit (HOSPITAL_BASED_OUTPATIENT_CLINIC_OR_DEPARTMENT_OTHER): Payer: Medicaid Other

## 2017-01-13 ENCOUNTER — Ambulatory Visit (HOSPITAL_BASED_OUTPATIENT_CLINIC_OR_DEPARTMENT_OTHER): Payer: Self-pay | Admitting: Medical

## 2017-01-13 ENCOUNTER — Ambulatory Visit (HOSPITAL_BASED_OUTPATIENT_CLINIC_OR_DEPARTMENT_OTHER): Payer: Medicaid Other | Admitting: Hematology and Oncology

## 2017-01-13 ENCOUNTER — Ambulatory Visit (HOSPITAL_COMMUNITY)
Admission: RE | Admit: 2017-01-13 | Discharge: 2017-01-13 | Disposition: A | Discharge status: Home / Self Care | Payer: Medicaid Other | Source: Ambulatory Visit | Attending: Hematology and Oncology | Admitting: Hematology and Oncology

## 2017-01-13 ENCOUNTER — Ambulatory Visit (HOSPITAL_BASED_OUTPATIENT_CLINIC_OR_DEPARTMENT_OTHER): Payer: Medicaid Other

## 2017-01-13 ENCOUNTER — Other Ambulatory Visit: Payer: Self-pay

## 2017-01-13 VITALS — BP 133/81 | HR 89 | Temp 98.1°F | Resp 18 | Wt 241.8 lb

## 2017-01-13 DIAGNOSIS — T451X5A Adverse effect of antineoplastic and immunosuppressive drugs, initial encounter: Secondary | ICD-10-CM

## 2017-01-13 DIAGNOSIS — G620 Drug-induced polyneuropathy: Secondary | ICD-10-CM

## 2017-01-13 DIAGNOSIS — C9000 Multiple myeloma not having achieved remission: Secondary | ICD-10-CM

## 2017-01-13 DIAGNOSIS — G893 Neoplasm related pain (acute) (chronic): Secondary | ICD-10-CM | POA: Diagnosis not present

## 2017-01-13 DIAGNOSIS — Z5112 Encounter for antineoplastic immunotherapy: Secondary | ICD-10-CM | POA: Diagnosis present

## 2017-01-13 LAB — CBC WITH DIFFERENTIAL/PLATELET
BASO%: 0.3 % (ref 0.0–2.0)
BASOS ABS: 0 10*3/uL (ref 0.0–0.1)
EOS ABS: 0.3 10*3/uL (ref 0.0–0.5)
EOS%: 5.3 % (ref 0.0–7.0)
HCT: 38.7 % (ref 38.4–49.9)
HEMOGLOBIN: 13.1 g/dL (ref 13.0–17.1)
LYMPH%: 13.5 % — AB (ref 14.0–49.0)
MCH: 31.3 pg (ref 27.2–33.4)
MCHC: 33.9 g/dL (ref 32.0–36.0)
MCV: 92.4 fL (ref 79.3–98.0)
MONO#: 0.3 10*3/uL (ref 0.1–0.9)
MONO%: 5.3 % (ref 0.0–14.0)
NEUT#: 3.8 10*3/uL (ref 1.5–6.5)
NEUT%: 75.6 % — ABNORMAL HIGH (ref 39.0–75.0)
Platelets: 137 10*3/uL — ABNORMAL LOW (ref 140–400)
RBC: 4.18 10*6/uL — AB (ref 4.20–5.82)
RDW: 13.5 % (ref 11.0–14.6)
WBC: 5 10*3/uL (ref 4.0–10.3)
lymph#: 0.7 10*3/uL — ABNORMAL LOW (ref 0.9–3.3)

## 2017-01-13 LAB — COMPREHENSIVE METABOLIC PANEL
ALT: 26 U/L (ref 0–55)
AST: 21 U/L (ref 5–34)
Albumin: 3.9 g/dL (ref 3.5–5.0)
Alkaline Phosphatase: 44 U/L (ref 40–150)
Anion Gap: 8 mEq/L (ref 3–11)
BUN: 15.9 mg/dL (ref 7.0–26.0)
CHLORIDE: 107 meq/L (ref 98–109)
CO2: 23 meq/L (ref 22–29)
Calcium: 9.1 mg/dL (ref 8.4–10.4)
Creatinine: 1.1 mg/dL (ref 0.7–1.3)
GLUCOSE: 95 mg/dL (ref 70–140)
POTASSIUM: 4 meq/L (ref 3.5–5.1)
SODIUM: 138 meq/L (ref 136–145)
Total Bilirubin: 0.76 mg/dL (ref 0.20–1.20)
Total Protein: 6.5 g/dL (ref 6.4–8.3)

## 2017-01-13 LAB — TYPE AND SCREEN
ABO/RH(D): A POS
ANTIBODY SCREEN: NEGATIVE

## 2017-01-13 LAB — MAGNESIUM: Magnesium: 2.2 mg/dl (ref 1.5–2.5)

## 2017-01-13 MED ORDER — SODIUM CHLORIDE 0.9 % IV SOLN
16.2000 mg/kg | Freq: Once | INTRAVENOUS | Status: AC
  Administered 2017-01-13: 1700 mg via INTRAVENOUS
  Filled 2017-01-13: qty 80

## 2017-01-13 MED ORDER — FAMOTIDINE IN NACL 20-0.9 MG/50ML-% IV SOLN
20.0000 mg | Freq: Once | INTRAVENOUS | Status: AC | PRN
  Administered 2017-01-13: 20 mg via INTRAVENOUS

## 2017-01-13 MED ORDER — MONTELUKAST SODIUM 10 MG PO TABS
ORAL_TABLET | ORAL | Status: AC
  Filled 2017-01-13: qty 1

## 2017-01-13 MED ORDER — ACETAMINOPHEN 325 MG PO TABS
650.0000 mg | ORAL_TABLET | Freq: Once | ORAL | Status: AC
  Administered 2017-01-13: 650 mg via ORAL

## 2017-01-13 MED ORDER — SODIUM CHLORIDE 0.9% FLUSH
10.0000 mL | INTRAVENOUS | Status: DC | PRN
  Administered 2017-01-13: 10 mL
  Filled 2017-01-13: qty 10

## 2017-01-13 MED ORDER — MONTELUKAST SODIUM 10 MG PO TABS
10.0000 mg | ORAL_TABLET | Freq: Once | ORAL | Status: AC
  Administered 2017-01-13: 10 mg via ORAL

## 2017-01-13 MED ORDER — PROCHLORPERAZINE MALEATE 10 MG PO TABS
10.0000 mg | ORAL_TABLET | Freq: Once | ORAL | Status: AC
  Administered 2017-01-13: 10 mg via ORAL

## 2017-01-13 MED ORDER — SODIUM CHLORIDE 0.9 % IV SOLN
20.0000 mg | Freq: Once | INTRAVENOUS | Status: AC
  Administered 2017-01-13: 20 mg via INTRAVENOUS
  Filled 2017-01-13: qty 2

## 2017-01-13 MED ORDER — METHYLPREDNISOLONE SODIUM SUCC 125 MG IJ SOLR
125.0000 mg | Freq: Once | INTRAMUSCULAR | Status: AC | PRN
  Administered 2017-01-13: 125 mg via INTRAVENOUS

## 2017-01-13 MED ORDER — HEPARIN SOD (PORK) LOCK FLUSH 100 UNIT/ML IV SOLN
500.0000 [IU] | Freq: Once | INTRAVENOUS | Status: AC | PRN
  Administered 2017-01-13: 500 [IU]
  Filled 2017-01-13: qty 5

## 2017-01-13 MED ORDER — SODIUM CHLORIDE 0.9 % IV SOLN
Freq: Once | INTRAVENOUS | Status: AC
  Administered 2017-01-13: 08:00:00 via INTRAVENOUS

## 2017-01-13 MED ORDER — OXYCODONE HCL 10 MG PO TABS: 10.0000 mg | ORAL_TABLET | Freq: Four times a day (QID) | ORAL | 0 refills | Status: DC | PRN

## 2017-01-13 MED ORDER — PROCHLORPERAZINE MALEATE 10 MG PO TABS
ORAL_TABLET | ORAL | Status: AC
  Filled 2017-01-13: qty 1

## 2017-01-13 MED ORDER — DIPHENHYDRAMINE HCL 25 MG PO CAPS
ORAL_CAPSULE | ORAL | Status: AC
  Filled 2017-01-13: qty 2

## 2017-01-13 MED ORDER — DIPHENHYDRAMINE HCL 25 MG PO CAPS
50.0000 mg | ORAL_CAPSULE | Freq: Once | ORAL | Status: AC
  Administered 2017-01-13: 50 mg via ORAL

## 2017-01-13 MED ORDER — ACETAMINOPHEN 325 MG PO TABS
ORAL_TABLET | ORAL | Status: AC
  Filled 2017-01-13: qty 2

## 2017-01-13 NOTE — Progress Notes (Signed)
1258 Patient reports "Is this supposed to make my throat feel a little itchy? My throat is itching." Infusion paused; 1L normal saline opened to gravity. Hypersensitivity protocol initiated. Sandi Mealy, PA made aware and present. Patient noted to have watery eyes, runny nose, and sneezing. Patient expressed relief of symptoms after administration of Pepcid and solu-medrol. Chemo restarted at rate of 58mL/hr. Dr. Alvy Bimler visited patient in infusion room and educated patient on importance of adhering to home steroid regimen. Patient verbalized understanding.

## 2017-01-13 NOTE — Progress Notes (Signed)
Discharge instructions printed and reviewed. Pt verbalizes understanding.

## 2017-01-13 NOTE — Patient Instructions (Addendum)
[facility] Discharge Instructions for Patients Receiving Chemotherapy  Today you received the following chemotherapy agents: Darzalex.  To help prevent nausea and vomiting after your treatment, we encourage you to take your nausea medication as directed   If you develop nausea and vomiting that is not controlled by your nausea medication, call the clinic.   BELOW ARE SYMPTOMS THAT SHOULD BE REPORTED IMMEDIATELY:  *FEVER GREATER THAN 100.5 F  *CHILLS WITH OR WITHOUT FEVER  NAUSEA AND VOMITING THAT IS NOT CONTROLLED WITH YOUR NAUSEA MEDICATION  *UNUSUAL SHORTNESS OF BREATH  *UNUSUAL BRUISING OR BLEEDING  TENDERNESS IN MOUTH AND THROAT WITH OR WITHOUT PRESENCE OF ULCERS  *URINARY PROBLEMS  *BOWEL PROBLEMS  UNUSUAL RASH Items with * indicate a potential emergency and should be followed up as soon as possible.  Feel free to call the clinic should you have any questions or concerns. The clinic phone number is [phone].  Please show the CHEMO ALERT CARD at check-in to the Emergency Department and triage nurse.  Daratumumab injection What is this medicine? DARATUMUMAB (dar a toom ue mab) is a monoclonal antibody. It is used to treat multiple myeloma. This medicine may be used for other purposes; ask your health care provider or pharmacist if you have questions. COMMON BRAND NAME(S): DARZALEX What should I tell my health care provider before I take this medicine? They need to know if you have any of these conditions: -infection (especially a virus infection such as chickenpox, cold sores, or herpes) -lung or breathing disease -pregnant or trying to get pregnant -breast-feeding -an unusual or allergic reaction to daratumumab, other medicines, foods, dyes, or preservatives How should I use this medicine? This medicine is for infusion into a vein. It is given by a health care professional in a hospital or clinic setting. Talk to your pediatrician regarding the  use of this medicine in children. Special care may be needed. Overdosage: If you think you have taken too much of this medicine contact a poison control center or emergency room at once. NOTE: This medicine is only for you. Do not share this medicine with others. What if I miss a dose? Keep appointments for follow-up doses as directed. It is important not to miss your dose. Call your doctor or health care professional if you are unable to keep an appointment. What may interact with this medicine? Interactions have not been studied. Give your health care provider a list of all the medicines, herbs, non-prescription drugs, or dietary supplements you use. Also tell them if you smoke, drink alcohol, or use illegal drugs. Some items may interact with your medicine. This list may not describe all possible interactions. Give your health care provider a list of all the medicines, herbs, non-prescription drugs, or dietary supplements you use. Also tell them if you smoke, drink alcohol, or use illegal drugs. Some items may interact with your medicine. What should I watch for while using this medicine? This drug may make you feel generally unwell. Report any side effects. Continue your course of treatment even though you feel ill unless your doctor tells you to stop. This medicine can cause serious allergic reactions. To reduce your risk you may need to take medicine before treatment with this medicine. Take your medicine as directed. This medicine can affect the results of blood tests to match your blood type. These changes can last for up to 6 months after the final dose. Your healthcare provider will do blood tests to match your blood type   before you start treatment. Tell all of your healthcare providers that you are being treated with this medicine before receiving a blood transfusion. This medicine can affect the results of some tests used to determine treatment response; extra tests may be needed to evaluate  response. Do not become pregnant while taking this medicine or for 3 months after stopping it. Women should inform their doctor if they wish to become pregnant or think they might be pregnant. There is a potential for serious side effects to an unborn child. Talk to your health care professional or pharmacist for more information. What side effects may I notice from receiving this medicine? Side effects that you should report to your doctor or health care professional as soon as possible: -allergic reactions like skin rash, itching or hives, swelling of the face, lips, or tongue -breathing problems -chills -cough -dizziness -feeling faint or lightheaded -headache -low blood counts - this medicine may decrease the number of white blood cells, red blood cells and platelets. You may be at increased risk for infections and bleeding. -nausea, vomiting -shortness of breath -signs of decreased platelets or bleeding - bruising, pinpoint red spots on the skin, black, tarry stools, blood in the urine -signs of decreased red blood cells - unusually weak or tired, feeling faint or lightheaded, falls -signs of infection - fever or chills, cough, sore throat, pain or difficulty passing urine Side effects that usually do not require medical attention (report to your doctor or health care professional if they continue or are bothersome): -back pain -diarrhea -muscle cramps -pain, tingling, numbness in the hands or feet -swelling of the ankles, feet, hands -tiredness This list may not describe all possible side effects. Call your doctor for medical advice about side effects. You may report side effects to FDA at 1-800-FDA-1088. Where should I keep my medicine? Keep out of the reach of children. This drug is given in a hospital or clinic and will not be stored at home. NOTE: This sheet is a summary. It may not cover all possible information. If you have questions about this medicine, talk to your doctor,  pharmacist, or health care provider.  2018 Elsevier/Gold Standard (2015-02-16 10:38:11)  

## 2017-01-14 ENCOUNTER — Telehealth: Payer: Self-pay

## 2017-01-14 ENCOUNTER — Encounter: Payer: Self-pay | Admitting: Hematology and Oncology

## 2017-01-14 LAB — KAPPA/LAMBDA LIGHT CHAINS
IG LAMBDA FREE LIGHT CHAIN: 41.4 mg/L — AB (ref 5.7–26.3)
Ig Kappa Free Light Chain: 9.7 mg/L (ref 3.3–19.4)
KAPPA/LAMBDA FLC RATIO: 0.23 — AB (ref 0.26–1.65)

## 2017-01-14 LAB — PRETREATMENT RBC PHENOTYPE

## 2017-01-14 LAB — DARATUMUMAB PRETREATMENT RBC PHENOTYPE

## 2017-01-14 NOTE — Assessment & Plan Note (Signed)
He had persistent cancer back pain He felt better while on oxycodone pain medicine We discussed chronic pain management and narcotic refill policy He is reminded about the risk of sedation, nausea and constipation

## 2017-01-14 NOTE — Assessment & Plan Note (Signed)
He tolerated treatment well with minimum infusion reaction We will proceed with treatment with weekly daratumumab for 9 weeks, weekly dexamethasone 20 mg to be taken on Sundays and Pomalyst to be taken 21 days on 7 days off I will see him next month for further toxicity review

## 2017-01-14 NOTE — Telephone Encounter (Signed)
Called to see how he is doing after first treatment yesterday. States that he is doing well and will call with questions and concerns.

## 2017-01-14 NOTE — Progress Notes (Signed)
[facility] Management [facility] Progress Note   Bradley Hunt [id number] Apr 12, 1970 46 y.o.  [facility] Bradley Hunt is managed by Dr. [name]  Actively treated with chemotherapy: yes  Current Therapy: Darzalex      Last Treated: 01/14/2017  Assessment: Plan:    Adverse effect of chemotherapy, initial encounter  [facility] Bradley Hunt was seen in the infusion room for a suspected chemotherapy reaction. He was receiving Darzalex at the time of his reaction. His symptoms included: Itching of the throat, runny nose, and sneezing He was premedicated with Tylenol 650 mg, Benadryl 50 mg, Decadron 20 mg and Singulair 10 mg prior to starting chemotherapy. Darzalex was paused and Bradley Hunt was given Pepcid 20 mg IV and Solu-Medrol 125 mg IV after onset of his symptoms. Bradley Hunt did  respond to intervention.    Please see After Visit Summary for patient specific instructions.  Future Appointments  Date Time Provider [place]  01/20/2017  8:00 AM [facility] PROCEDURE 1 [facility] None  01/20/2017  8:00 AM [facility] LAB 5 [facility] None  01/27/2017  7:45 AM [facility] LAB 6 [facility] None  01/27/2017  8:00 AM [facility] PROCEDURE 2 [facility] None    No orders of the defined types were placed in this encounter.      Subjective:   Patient ID:  Bradley Hunt is a 46 y.o. (DOB 1970/10/22) male.  Chief Complaint: No chief complaint on file.   HPI Bradley Hunt was seen in the infusion room for a suspected chemotherapy reaction. He was receiving Darzalex at the time of his reaction. His symptoms included: Itching of the throat, runny nose, and sneezing. He was premedicated with Tylenol 650 mg, Benadryl 50 mg, Decadron 20 mg and Singulair 10 mg prior to starting chemotherapy. Darzalex was paused and Bradley Hunt was given Pepcid 20 mg IV and Solu-Medrol 125 mg IV after onset of his symptoms. [name] did  respond to intervention.    Medications: I have reviewed the  patient's current medications.  Allergies: No Known Allergies  Past Medical History:  Diagnosis Date  . Bone metastases ([place])    T spine and L spine  . History of chemotherapy   . History of radiation therapy   . Multiple myeloma not having achieved remission ([place]) 06/25/15  . Numbness    lower extermities bilat     Past Surgical History:  Procedure Laterality Date  . ANTERIOR CRUCIATE LIGAMENT REPAIR    . IR FLUORO GUIDE PORT INSERTION RIGHT  05/16/2016  . IR US GUIDE VASC ACCESS RIGHT  05/16/2016  . LAMINECTOMY N/A 06/25/2015   Procedure: Thoracic six LAMINECTOMY RESECTION FOR TUMOR;  Surgeon: Consuella Lose, MD;  Location: [place] [facility];  Service: Neurosurgery;  Laterality: N/A;    Family History  Problem Relation Age of Onset  . Cancer Neg Hx     Social History   Socioeconomic History  . Marital status: Single    Spouse name: Not on file  . Number of children: 1  . Years of education: Not on file  . Highest education level: Not on file  Social Needs  . Financial resource strain: Not on file  . Food insecurity - worry: Not on file  . Food insecurity - inability: Not on file  . Transportation needs - medical: Not on file  . Transportation needs - non-medical: Not on file  Occupational History  . Not on file  Tobacco Use  . Smoking status: Former Smoker    Packs/day: 1.00    Years:  10.00    Pack years: 10.00    Types: Cigarettes    Last attempt to quit: 01/28/1997    Years since quitting: 19.9  . Smokeless tobacco: Never Used  Substance and Sexual Activity  . Alcohol use: No  . Drug use: No  . Sexual activity: Yes    Comment: friend Amadou next of kin. 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 Not on file  Social History Narrative  . Not on file    Past Medical History, Surgical history, Social history, and Family history were reviewed and updated as appropriate.   Please see review of systems for further details on the patient's review  from today.   Review of Systems:  Review of Systems  Constitutional: Negative for chills and diaphoresis.  HENT: Positive for rhinorrhea and sneezing. Negative for congestion and trouble swallowing.        Throat itching  Respiratory: Negative for cough, chest tightness and shortness of breath.   Cardiovascular: Negative for chest pain.  Gastrointestinal: Negative for nausea.    Objective:   Physical Exam:  There were no vitals taken for this visit. ECOG: 0  Physical Exam  Constitutional: No distress.  HENT:  Head: Normocephalic and atraumatic.  Eyes: Right eye exhibits no discharge. Left eye exhibits no discharge.  Cardiovascular: Normal rate, regular rhythm and normal heart sounds. Exam reveals no gallop and no friction rub.  No murmur heard. Pulmonary/Chest: Effort normal and breath sounds normal. No respiratory distress. He has no wheezes. He has no rales.  Neurological: He is alert.  Skin: Skin is warm and dry. He is not diaphoretic.    Lab Review:     Component Value Date/Time   NA 138 01/13/2017 0756   K 4.0 01/13/2017 0756   CL 110 07/20/2015 0755   CO2 23 01/13/2017 0756   GLUCOSE 95 01/13/2017 0756   BUN 15.9 01/13/2017 0756   CREATININE 1.1 01/13/2017 0756   CALCIUM 9.1 01/13/2017 0756   PROT 6.5 01/13/2017 0756   ALBUMIN 3.9 01/13/2017 0756   AST 21 01/13/2017 0756   ALT 26 01/13/2017 0756   ALKPHOS 44 01/13/2017 0756   BILITOT 0.76 01/13/2017 0756   GFRNONAA 49 (L) 07/20/2015 0755   GFRAA 56 (L) 07/20/2015 0755       Component Value Date/Time   WBC 5.0 01/13/2017 0756   WBC 3.9 (L) 07/08/2016 0928   RBC 4.18 (L) 01/13/2017 0756   RBC 4.11 (L) 07/08/2016 0928   HGB 13.1 01/13/2017 0756   HCT 38.7 01/13/2017 0756   PLT 137 (L) 01/13/2017 0756   MCV 92.4 01/13/2017 0756   MCH 31.3 01/13/2017 0756   MCH 30.9 07/08/2016 0928   MCHC 33.9 01/13/2017 0756   MCHC 34.9 07/08/2016 0928   RDW 13.5 01/13/2017 0756   LYMPHSABS 0.7 (L) 01/13/2017 0756     MONOABS 0.3 01/13/2017 0756   EOSABS 0.3 01/13/2017 0756   BASOSABS 0.0 01/13/2017 0756   -------------------------------  Imaging from last 24 hours (if applicable):  Radiology interpretation: No results found.

## 2017-01-14 NOTE — Assessment & Plan Note (Signed)
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 Continue the same dose for now

## 2017-01-14 NOTE — Progress Notes (Signed)
[place] OFFICE PROGRESS NOTE  Patient Care Team: [name], MD as PCP - General (Internal Medicine)  SUMMARY OF ONCOLOGIC HISTORY:   Multiple myeloma not having achieved remission [facility])   06/23/2015 - 06/28/2015 Hospital Admission    The patient was admitted to the hospital due to gait ataxia and back pain. He was subsequently found to have cord compression underwent surgery and was discharged home      06/24/2015 Imaging    Abnormal appearance of the T6 vertebral body, highly suspicious for possible osseous metastasis. Associated pathologic fracture withup to 30% height loss. There is associated abnormal soft tissue density within the ventral epidural space,      06/24/2015 Imaging    MRI lumbar: Focal osseous lesion with abnormal enhancement involving the right pedicle of L3, suspicious for possible osseous metastasisgiven the findings in the thoracic spine. Question additional focal lesion within the right iliac wing as above.        06/25/2015 Pathology Results    Accession: [id number] bone biopsy come from plasma cell neoplasm.      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 Zometa is not given due to inability to get dental clearance      12/14/2015 - 04/24/2016 Chemotherapy    He is started on maintenance treatment with Revlimid only      12/18/2015 Imaging    MRI thoracic and lumbar spine showed numerous enhancing foci throughout the thoracic and lumbar spine with several new small foci in the lumbar spine in comparison with prior MRI compatible with metastatic disease. Stable loss of height of the T3, T4, and T6 vertebral bodies and new postsurgical  changes related to T6 laminectomy. No significant epidural disease or evidence for cord compression. No abnormal enhancement of the spinal cord or cauda equina.      05/02/2016 Bone Marrow Biopsy    Outside bone marrow biopsy showed 20% myeloma involvement      05/16/2016 Procedure    Successful placement of a right internal jugular approach power injectable Port-A-Cath. The catheter is ready for immediate use.      05/21/2016 - 07/03/2016 Chemotherapy    He received Kyprolis, Cytoxan and dexamethasone       07/08/2016 Procedure    Status post CT-guided bone marrow biopsy, with tissue specimen sent to pathology for complete histopathologic analysis      07/08/2016 Bone Marrow Biopsy    Bone Marrow, Aspirate,Biopsy, and Clot BONE MARROW: - MILDLY HYPERCELLULAR MARROW (60%) WITH PLASMA CELL NEOPLASM - SEE COMMENT PERIPHERAL BLOOD: - NORMOCYTIC ANEMIA Diagnosis Note The marrow is hypercellular with lambda-restricted plasma cells consistent with persistence of the patient's previously diagnosed plasma cell neoplasm. The plasma cells comprise approximately 10-15% of the total marrow cellularity, are enlarged, and arranged in clusters.      08/14/2016 Miscellaneous    He received conditioning treatment with melphalan      08/15/2016 Bone Marrow Transplant    He received autologous stem cell transplant      08/24/2016 - 08/29/2016 Hospital Admission    His post-transplant course was complicated by E-Coli bacteremia      11/26/2016 PET scan    PET  CT at [facility] 1. Technically limited study due to soft tissue uptake. 2. New hypermetabolic uptake at C7 spinous process and left proximal femur that is of questionable significance in absence of underlying CT correlate. Further assessment with whole body bone scan may be considered. 2. Redemonstrated nonhypermetabolic multifocal lucent and sclerotic lesions throughout the spine which are similar to prior.       12/02/2016 Bone Marrow Biopsy     He had repeat bone marrow biopsy at [facility] An immunohistochemical stain for CD138 is performed on the bone marrow core biopsy demonstrates increased plasma cells with focal clustering (10-20% overall), which are monotypic for lambda light chain by in situ hybridization.      01/06/2017 -  Chemotherapy    He received weekly Dexamethasone, Pomalyst days 1-21 and Daratumumab       INTERVAL HISTORY: Please see below for problem oriented charting. He returns for further follow-up He tolerated Pomalyst well He continues to have tightness around his chest but no new neurological deficit, new bone pain no recent infection  REVIEW OF SYSTEMS:   Constitutional: Denies fevers, chills or abnormal weight loss Eyes: Denies blurriness of vision Ears, nose, mouth, throat, and face: Denies mucositis or sore throat Respiratory: Denies cough, dyspnea or wheezes Cardiovascular: Denies palpitation, chest discomfort or lower extremity swelling Gastrointestinal:  Denies nausea, heartburn or change in bowel habits Skin: Denies abnormal skin rashes Lymphatics: Denies new lymphadenopathy or easy bruising Neurological:Denies numbness, tingling or new weaknesses Behavioral/Psych: Mood is stable, no new changes  All other systems were reviewed with the patient and are negative.  I have reviewed the past medical history, past surgical history, social history and family history with the patient and they are unchanged from previous note.  ALLERGIES:  has No Known Allergies.  MEDICATIONS:  Current Outpatient Medications  Medication Sig Dispense Refill  . acyclovir (ZOVIRAX) 400 MG tablet Take 1 tablet (400 mg total) by mouth 2 (two) times daily. 60 tablet 11  . acyclovir (ZOVIRAX) 800 MG tablet Take 800 mg by mouth 2 (two) times daily.  10  . aspirin EC 81 MG tablet Take 81 mg by mouth daily.    . cholecalciferol (VITAMIN D) 1000 units tablet Take 1,000 Units by mouth daily.    . Cholecalciferol (VITAMIN  D-1000 MAX ST) 1000 units tablet Take 1,000 Units by mouth daily.    Marland Kitchen dexamethasone (DECADRON) 4 MG tablet Take 20 mg by mouth once a week.  0  . lidocaine-prilocaine (EMLA) cream Apply to affected area once (Patient not taking: Reported on 09/06/2016) 30 g 3  . Oxycodone HCl 10 MG TABS Take 1 tablet (10 mg total) by mouth every 6 (six) hours as needed. 90 tablet 0  . pantoprazole (PROTONIX) 40 MG tablet Take 40 mg by mouth daily.  0  . pomalidomide (POMALYST) 3 MG capsule Take 1 capsule (3 mg total) by mouth daily. Take with water for 21 days on,7 days off. Repeat every 28 days. 21 capsule 0  . sulfamethoxazole-trimethoprim (BACTRIM DS,SEPTRA DS) 800-160 MG tablet Take 1 tablet by mouth 3 (three) times a week.     No current facility-administered medications for this visit.     PHYSICAL EXAMINATION: ECOG PERFORMANCE STATUS: 1 - Symptomatic but completely ambulatory  There were no vitals filed for this visit. There were no vitals filed for this visit.  GENERAL:alert, no distress and comfortable SKIN: skin color, texture, turgor are normal, no rashes or significant lesions EYES: normal, Conjunctiva are pink and non-injected,  sclera clear OROPHARYNX:no exudate, no erythema and lips, buccal mucosa, and tongue normal  NECK: supple, thyroid normal size, non-tender, without nodularity LYMPH:  no palpable lymphadenopathy in the cervical, axillary or inguinal LUNGS: clear to auscultation and percussion with normal breathing effort HEART: regular rate & rhythm and no murmurs and no lower extremity edema ABDOMEN:abdomen soft, non-tender and normal bowel sounds Musculoskeletal:no cyanosis of digits and no clubbing  NEURO: alert & oriented x 3 with fluent speech, no focal motor/sensory deficits  LABORATORY DATA:  I have reviewed the data as listed    Component Value Date/Time   NA 138 01/13/2017 0756   K 4.0 01/13/2017 0756   CL 110 07/20/2015 0755   CO2 23 01/13/2017 0756   GLUCOSE 95  01/13/2017 0756   BUN 15.9 01/13/2017 0756   CREATININE 1.1 01/13/2017 0756   CALCIUM 9.1 01/13/2017 0756   PROT 6.5 01/13/2017 0756   ALBUMIN 3.9 01/13/2017 0756   AST 21 01/13/2017 0756   ALT 26 01/13/2017 0756   ALKPHOS 44 01/13/2017 0756   BILITOT 0.76 01/13/2017 0756   GFRNONAA 49 (L) 07/20/2015 0755   GFRAA 56 (L) 07/20/2015 0755    No results found for: SPEP, UPEP  Lab Results  Component Value Date   WBC 5.0 01/13/2017   NEUTROABS 3.8 01/13/2017   HGB 13.1 01/13/2017   HCT 38.7 01/13/2017   MCV 92.4 01/13/2017   PLT 137 (L) 01/13/2017      Chemistry      Component Value Date/Time   NA 138 01/13/2017 0756   K 4.0 01/13/2017 0756   CL 110 07/20/2015 0755   CO2 23 01/13/2017 0756   BUN 15.9 01/13/2017 0756   CREATININE 1.1 01/13/2017 0756      Component Value Date/Time   CALCIUM 9.1 01/13/2017 0756   ALKPHOS 44 01/13/2017 0756   AST 21 01/13/2017 0756   ALT 26 01/13/2017 0756   BILITOT 0.76 01/13/2017 0756      ASSESSMENT & PLAN:  Multiple myeloma not having achieved remission ([place]) He tolerated treatment well with minimum infusion reaction We will proceed with treatment with weekly daratumumab for 9 weeks, weekly dexamethasone 20 mg to be taken on Sundays and Pomalyst to be taken 21 days on 7 days off I will see him next month for further toxicity review  Cancer associated pain He had persistent cancer back pain He felt better while on oxycodone pain medicine We discussed chronic pain management and narcotic refill policy He is reminded about the risk of sedation, nausea and constipation  Peripheral neuropathy due to chemotherapy [facility]) 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 Continue the same dose for now   No orders of the defined types were placed in this encounter.  All questions were answered. The patient knows to call the clinic with any problems,  questions or concerns. No barriers to learning was detected. I spent 15 minutes counseling the patient face to face. The total time spent in the appointment was 20 minutes and more than 50% was on counseling and review of test results     [name], MD 01/14/2017 4:55 PM

## 2017-01-14 NOTE — Telephone Encounter (Signed)
-----   Message from Egbert Garibaldi, RN sent at 01/13/2017  4:32 PM EST ----- Regarding: Alvy Bimler f/u call  Pt of Dr. Alvy Bimler. First time Darzalex.

## 2017-01-15 ENCOUNTER — Telehealth: Payer: Self-pay | Admitting: Hematology and Oncology

## 2017-01-15 NOTE — Telephone Encounter (Signed)
Per 12/17 los -  : Return for scheduling msg sent.

## 2017-01-15 NOTE — Telephone Encounter (Signed)
Scheduled appt per 12/17 sch message - unable to reach patient and unable to leave message (vmail full ) - sent reminder letter in the mail with appt date and time.

## 2017-01-20 ENCOUNTER — Other Ambulatory Visit (HOSPITAL_BASED_OUTPATIENT_CLINIC_OR_DEPARTMENT_OTHER): Payer: Medicaid Other

## 2017-01-20 ENCOUNTER — Ambulatory Visit (HOSPITAL_BASED_OUTPATIENT_CLINIC_OR_DEPARTMENT_OTHER): Payer: Medicaid Other

## 2017-01-20 VITALS — BP 141/83 | HR 70 | Temp 97.8°F | Resp 18

## 2017-01-20 DIAGNOSIS — C9000 Multiple myeloma not having achieved remission: Secondary | ICD-10-CM

## 2017-01-20 DIAGNOSIS — Z5112 Encounter for antineoplastic immunotherapy: Secondary | ICD-10-CM

## 2017-01-20 LAB — CBC WITH DIFFERENTIAL/PLATELET
BASO%: 0.1 % (ref 0.0–2.0)
Basophils Absolute: 0 10*3/uL (ref 0.0–0.1)
EOS ABS: 0.1 10*3/uL (ref 0.0–0.5)
EOS%: 1 % (ref 0.0–7.0)
HCT: 38.1 % — ABNORMAL LOW (ref 38.4–49.9)
HEMOGLOBIN: 13.2 g/dL (ref 13.0–17.1)
LYMPH%: 11.6 % — AB (ref 14.0–49.0)
MCH: 31.4 pg (ref 27.2–33.4)
MCHC: 34.6 g/dL (ref 32.0–36.0)
MCV: 90.7 fL (ref 79.3–98.0)
MONO#: 1.4 10*3/uL — ABNORMAL HIGH (ref 0.1–0.9)
MONO%: 20.9 % — AB (ref 0.0–14.0)
NEUT%: 66.4 % (ref 39.0–75.0)
NEUTROS ABS: 4.5 10*3/uL (ref 1.5–6.5)
Platelets: 129 10*3/uL — ABNORMAL LOW (ref 140–400)
RBC: 4.2 10*6/uL (ref 4.20–5.82)
RDW: 12.8 % (ref 11.0–14.6)
WBC: 6.8 10*3/uL (ref 4.0–10.3)
lymph#: 0.8 10*3/uL — ABNORMAL LOW (ref 0.9–3.3)

## 2017-01-20 LAB — COMPREHENSIVE METABOLIC PANEL
ALBUMIN: 4 g/dL (ref 3.5–5.0)
ALT: 18 U/L (ref 0–55)
AST: 10 U/L (ref 5–34)
Alkaline Phosphatase: 45 U/L (ref 40–150)
Anion Gap: 11 mEq/L (ref 3–11)
BUN: 20.2 mg/dL (ref 7.0–26.0)
CO2: 21 meq/L — AB (ref 22–29)
Calcium: 9.6 mg/dL (ref 8.4–10.4)
Chloride: 107 mEq/L (ref 98–109)
Creatinine: 1 mg/dL (ref 0.7–1.3)
GLUCOSE: 110 mg/dL (ref 70–140)
POTASSIUM: 3.9 meq/L (ref 3.5–5.1)
SODIUM: 140 meq/L (ref 136–145)
TOTAL PROTEIN: 6.7 g/dL (ref 6.4–8.3)
Total Bilirubin: 0.68 mg/dL (ref 0.20–1.20)

## 2017-01-20 LAB — MAGNESIUM: Magnesium: 2.7 mg/dl — ABNORMAL HIGH (ref 1.5–2.5)

## 2017-01-20 MED ORDER — SODIUM CHLORIDE 0.9 % IV SOLN
20.0000 mg | Freq: Once | INTRAVENOUS | Status: AC
  Administered 2017-01-20: 20 mg via INTRAVENOUS
  Filled 2017-01-20: qty 2

## 2017-01-20 MED ORDER — PROCHLORPERAZINE MALEATE 10 MG PO TABS
ORAL_TABLET | ORAL | Status: AC
  Filled 2017-01-20: qty 1

## 2017-01-20 MED ORDER — SODIUM CHLORIDE 0.9 % IV SOLN
Freq: Once | INTRAVENOUS | Status: AC
  Administered 2017-01-20: 09:00:00 via INTRAVENOUS

## 2017-01-20 MED ORDER — HEPARIN SOD (PORK) LOCK FLUSH 100 UNIT/ML IV SOLN
500.0000 [IU] | Freq: Once | INTRAVENOUS | Status: AC | PRN
  Administered 2017-01-20: 500 [IU]
  Filled 2017-01-20: qty 5

## 2017-01-20 MED ORDER — DIPHENHYDRAMINE HCL 25 MG PO CAPS
ORAL_CAPSULE | ORAL | Status: AC
  Filled 2017-01-20: qty 2

## 2017-01-20 MED ORDER — ACETAMINOPHEN 325 MG PO TABS
ORAL_TABLET | ORAL | Status: AC
  Filled 2017-01-20: qty 2

## 2017-01-20 MED ORDER — DIPHENHYDRAMINE HCL 25 MG PO CAPS
50.0000 mg | ORAL_CAPSULE | Freq: Once | ORAL | Status: AC
  Administered 2017-01-20: 50 mg via ORAL

## 2017-01-20 MED ORDER — FAMOTIDINE IN NACL 20-0.9 MG/50ML-% IV SOLN
INTRAVENOUS | Status: AC
  Filled 2017-01-20: qty 50

## 2017-01-20 MED ORDER — MONTELUKAST SODIUM 10 MG PO TABS
10.0000 mg | ORAL_TABLET | Freq: Every day | ORAL | Status: DC
  Administered 2017-01-20: 10 mg via ORAL

## 2017-01-20 MED ORDER — FAMOTIDINE IN NACL 20-0.9 MG/50ML-% IV SOLN
20.0000 mg | Freq: Once | INTRAVENOUS | Status: AC
  Administered 2017-01-20: 20 mg via INTRAVENOUS

## 2017-01-20 MED ORDER — ACETAMINOPHEN 325 MG PO TABS
650.0000 mg | ORAL_TABLET | Freq: Once | ORAL | Status: AC
  Administered 2017-01-20: 650 mg via ORAL

## 2017-01-20 MED ORDER — SODIUM CHLORIDE 0.9% FLUSH
10.0000 mL | INTRAVENOUS | Status: DC | PRN
  Administered 2017-01-20: 10 mL
  Filled 2017-01-20: qty 10

## 2017-01-20 MED ORDER — PROCHLORPERAZINE MALEATE 10 MG PO TABS
10.0000 mg | ORAL_TABLET | Freq: Once | ORAL | Status: AC
  Administered 2017-01-20: 10 mg via ORAL

## 2017-01-20 MED ORDER — SODIUM CHLORIDE 0.9 % IV SOLN
16.2000 mg/kg | Freq: Once | INTRAVENOUS | Status: AC
  Administered 2017-01-20: 1700 mg via INTRAVENOUS
  Filled 2017-01-20: qty 80

## 2017-01-20 MED ORDER — MONTELUKAST SODIUM 10 MG PO TABS
ORAL_TABLET | ORAL | Status: AC
  Filled 2017-01-20: qty 1

## 2017-01-20 NOTE — Patient Instructions (Signed)
[facility] Discharge Instructions for Patients Receiving Chemotherapy  Today you received the following chemotherapy agent: Darzalex  To help prevent nausea and vomiting after your treatment, we encourage you to take your nausea medication as directed   If you develop nausea and vomiting that is not controlled by your nausea medication, call the clinic.   BELOW ARE SYMPTOMS THAT SHOULD BE REPORTED IMMEDIATELY:  *FEVER GREATER THAN 100.5 F  *CHILLS WITH OR WITHOUT FEVER  NAUSEA AND VOMITING THAT IS NOT CONTROLLED WITH YOUR NAUSEA MEDICATION  *UNUSUAL SHORTNESS OF BREATH  *UNUSUAL BRUISING OR BLEEDING  TENDERNESS IN MOUTH AND THROAT WITH OR WITHOUT PRESENCE OF ULCERS  *URINARY PROBLEMS  *BOWEL PROBLEMS  UNUSUAL RASH Items with * indicate a potential emergency and should be followed up as soon as possible.  Feel free to call the clinic should you have any questions or concerns. The clinic phone number is [phone].  Please show the CHEMO ALERT CARD at check-in to the Emergency Department and triage nurse.  Daratumumab injection What is this medicine? DARATUMUMAB (dar a toom ue mab) is a monoclonal antibody. It is used to treat multiple myeloma. This medicine may be used for other purposes; ask your health care provider or pharmacist if you have questions. COMMON BRAND NAME(S): DARZALEX What should I tell my health care provider before I take this medicine? They need to know if you have any of these conditions: -infection (especially a virus infection such as chickenpox, cold sores, or herpes) -lung or breathing disease -pregnant or trying to get pregnant -breast-feeding -an unusual or allergic reaction to daratumumab, other medicines, foods, dyes, or preservatives How should I use this medicine? This medicine is for infusion into a vein. It is given by a health care professional in a hospital or clinic setting. Talk to your pediatrician regarding the  use of this medicine in children. Special care may be needed. Overdosage: If you think you have taken too much of this medicine contact a poison control center or emergency room at once. NOTE: This medicine is only for you. Do not share this medicine with others. What if I miss a dose? Keep appointments for follow-up doses as directed. It is important not to miss your dose. Call your doctor or health care professional if you are unable to keep an appointment. What may interact with this medicine? Interactions have not been studied. Give your health care provider a list of all the medicines, herbs, non-prescription drugs, or dietary supplements you use. Also tell them if you smoke, drink alcohol, or use illegal drugs. Some items may interact with your medicine. This list may not describe all possible interactions. Give your health care provider a list of all the medicines, herbs, non-prescription drugs, or dietary supplements you use. Also tell them if you smoke, drink alcohol, or use illegal drugs. Some items may interact with your medicine. What should I watch for while using this medicine? This drug may make you feel generally unwell. Report any side effects. Continue your course of treatment even though you feel ill unless your doctor tells you to stop. This medicine can cause serious allergic reactions. To reduce your risk you may need to take medicine before treatment with this medicine. Take your medicine as directed. This medicine can affect the results of blood tests to match your blood type. These changes can last for up to 6 months after the final dose. Your healthcare provider will do blood tests to match your blood type   before you start treatment. Tell all of your healthcare providers that you are being treated with this medicine before receiving a blood transfusion. This medicine can affect the results of some tests used to determine treatment response; extra tests may be needed to evaluate  response. Do not become pregnant while taking this medicine or for 3 months after stopping it. Women should inform their doctor if they wish to become pregnant or think they might be pregnant. There is a potential for serious side effects to an unborn child. Talk to your health care professional or pharmacist for more information. What side effects may I notice from receiving this medicine? Side effects that you should report to your doctor or health care professional as soon as possible: -allergic reactions like skin rash, itching or hives, swelling of the face, lips, or tongue -breathing problems -chills -cough -dizziness -feeling faint or lightheaded -headache -low blood counts - this medicine may decrease the number of white blood cells, red blood cells and platelets. You may be at increased risk for infections and bleeding. -nausea, vomiting -shortness of breath -signs of decreased platelets or bleeding - bruising, pinpoint red spots on the skin, black, tarry stools, blood in the urine -signs of decreased red blood cells - unusually weak or tired, feeling faint or lightheaded, falls -signs of infection - fever or chills, cough, sore throat, pain or difficulty passing urine Side effects that usually do not require medical attention (report to your doctor or health care professional if they continue or are bothersome): -back pain -diarrhea -muscle cramps -pain, tingling, numbness in the hands or feet -swelling of the ankles, feet, hands -tiredness This list may not describe all possible side effects. Call your doctor for medical advice about side effects. You may report side effects to FDA at 1-800-FDA-1088. Where should I keep my medicine? Keep out of the reach of children. This drug is given in a hospital or clinic and will not be stored at home. NOTE: This sheet is a summary. It may not cover all possible information. If you have questions about this medicine, talk to your doctor,  pharmacist, or health care provider.  2018 Elsevier/Gold Standard (2015-02-16 10:38:11)  

## 2017-01-20 NOTE — Progress Notes (Signed)
Ok to treat with magnesium 2.7 per MD Alvy Bimler.  1145- Per MD Alvy Bimler if pt tolerates his darzalex infusion at 50 ml/hr and 100 ml/hr then we can bypass the 150 ml/hr rate and titrate to 200 ml/hr for the remainder of the infusion.  MD, RN, and pharmacy aware that pt had reaction last time and RN will monitor pt closely.  Pt aware of titration decision and agreed to rate increase.

## 2017-01-27 ENCOUNTER — Ambulatory Visit (HOSPITAL_BASED_OUTPATIENT_CLINIC_OR_DEPARTMENT_OTHER): Payer: Medicaid Other

## 2017-01-27 ENCOUNTER — Other Ambulatory Visit (HOSPITAL_BASED_OUTPATIENT_CLINIC_OR_DEPARTMENT_OTHER): Payer: Medicaid Other

## 2017-01-27 VITALS — BP 127/77 | HR 70 | Temp 98.0°F | Resp 17

## 2017-01-27 DIAGNOSIS — Z5112 Encounter for antineoplastic immunotherapy: Secondary | ICD-10-CM | POA: Diagnosis not present

## 2017-01-27 DIAGNOSIS — C9000 Multiple myeloma not having achieved remission: Secondary | ICD-10-CM

## 2017-01-27 LAB — CBC WITH DIFFERENTIAL/PLATELET
BASO%: 0.3 % (ref 0.0–2.0)
Basophils Absolute: 0 10*3/uL (ref 0.0–0.1)
EOS ABS: 0 10*3/uL (ref 0.0–0.5)
EOS%: 0.6 % (ref 0.0–7.0)
HCT: 35.7 % — ABNORMAL LOW (ref 38.4–49.9)
HGB: 12.3 g/dL — ABNORMAL LOW (ref 13.0–17.1)
LYMPH%: 22.7 % (ref 14.0–49.0)
MCH: 31.2 pg (ref 27.2–33.4)
MCHC: 34.5 g/dL (ref 32.0–36.0)
MCV: 90.6 fL (ref 79.3–98.0)
MONO#: 1 10*3/uL — AB (ref 0.1–0.9)
MONO%: 31.2 % — ABNORMAL HIGH (ref 0.0–14.0)
NEUT#: 1.5 10*3/uL (ref 1.5–6.5)
NEUT%: 45.2 % (ref 39.0–75.0)
PLATELETS: 157 10*3/uL (ref 140–400)
RBC: 3.94 10*6/uL — AB (ref 4.20–5.82)
RDW: 12.8 % (ref 11.0–14.6)
WBC: 3.2 10*3/uL — ABNORMAL LOW (ref 4.0–10.3)
lymph#: 0.7 10*3/uL — ABNORMAL LOW (ref 0.9–3.3)

## 2017-01-27 LAB — COMPREHENSIVE METABOLIC PANEL
ALBUMIN: 3.7 g/dL (ref 3.5–5.0)
ALK PHOS: 42 U/L (ref 40–150)
ALT: 18 U/L (ref 0–55)
AST: 10 U/L (ref 5–34)
Anion Gap: 6 mEq/L (ref 3–11)
BILIRUBIN TOTAL: 0.72 mg/dL (ref 0.20–1.20)
BUN: 14.3 mg/dL (ref 7.0–26.0)
CALCIUM: 9.1 mg/dL (ref 8.4–10.4)
CO2: 23 mEq/L (ref 22–29)
CREATININE: 0.9 mg/dL (ref 0.7–1.3)
Chloride: 110 mEq/L — ABNORMAL HIGH (ref 98–109)
EGFR: >60 mL/min/{1.73_m2} (ref >60)
GLUCOSE: 122 mg/dL (ref 70–140)
Potassium: 4.1 mEq/L (ref 3.5–5.1)
Sodium: 139 mEq/L (ref 136–145)
TOTAL PROTEIN: 6.2 g/dL — AB (ref 6.4–8.3)

## 2017-01-27 MED ORDER — PROCHLORPERAZINE MALEATE 10 MG PO TABS
10.0000 mg | ORAL_TABLET | Freq: Once | ORAL | Status: AC
  Administered 2017-01-27: 10 mg via ORAL

## 2017-01-27 MED ORDER — SODIUM CHLORIDE 0.9 % IV SOLN
20.0000 mg | Freq: Once | INTRAVENOUS | Status: AC
  Administered 2017-01-27: 20 mg via INTRAVENOUS
  Filled 2017-01-27: qty 2

## 2017-01-27 MED ORDER — FAMOTIDINE IN NACL 20-0.9 MG/50ML-% IV SOLN
INTRAVENOUS | Status: AC
  Filled 2017-01-27: qty 50

## 2017-01-27 MED ORDER — DIPHENHYDRAMINE HCL 25 MG PO CAPS
50.0000 mg | ORAL_CAPSULE | Freq: Once | ORAL | Status: AC
  Administered 2017-01-27: 50 mg via ORAL

## 2017-01-27 MED ORDER — MONTELUKAST SODIUM 10 MG PO TABS
ORAL_TABLET | ORAL | Status: AC
  Filled 2017-01-27: qty 1

## 2017-01-27 MED ORDER — ACETAMINOPHEN 325 MG PO TABS
ORAL_TABLET | ORAL | Status: AC
  Filled 2017-01-27: qty 2

## 2017-01-27 MED ORDER — HEPARIN SOD (PORK) LOCK FLUSH 100 UNIT/ML IV SOLN
500.0000 [IU] | Freq: Once | INTRAVENOUS | Status: AC | PRN
  Administered 2017-01-27: 500 [IU]
  Filled 2017-01-27: qty 5

## 2017-01-27 MED ORDER — PROCHLORPERAZINE MALEATE 10 MG PO TABS
ORAL_TABLET | ORAL | Status: AC
  Filled 2017-01-27: qty 1

## 2017-01-27 MED ORDER — MONTELUKAST SODIUM 10 MG PO TABS
10.0000 mg | ORAL_TABLET | Freq: Every day | ORAL | Status: DC
  Administered 2017-01-27: 10 mg via ORAL

## 2017-01-27 MED ORDER — FAMOTIDINE IN NACL 20-0.9 MG/50ML-% IV SOLN
20.0000 mg | Freq: Once | INTRAVENOUS | Status: AC
  Administered 2017-01-27: 20 mg via INTRAVENOUS

## 2017-01-27 MED ORDER — DIPHENHYDRAMINE HCL 25 MG PO CAPS
ORAL_CAPSULE | ORAL | Status: AC
  Filled 2017-01-27: qty 2

## 2017-01-27 MED ORDER — ACETAMINOPHEN 325 MG PO TABS
650.0000 mg | ORAL_TABLET | Freq: Once | ORAL | Status: AC
  Administered 2017-01-27: 650 mg via ORAL

## 2017-01-27 MED ORDER — SODIUM CHLORIDE 0.9 % IV SOLN
16.2000 mg/kg | Freq: Once | INTRAVENOUS | Status: AC
  Administered 2017-01-27: 1700 mg via INTRAVENOUS
  Filled 2017-01-27: qty 80

## 2017-01-27 MED ORDER — SODIUM CHLORIDE 0.9% FLUSH
10.0000 mL | INTRAVENOUS | Status: DC | PRN
  Administered 2017-01-27: 10 mL
  Filled 2017-01-27: qty 10

## 2017-01-27 MED ORDER — SODIUM CHLORIDE 0.9 % IV SOLN
Freq: Once | INTRAVENOUS | Status: AC
  Administered 2017-01-27: 09:00:00 via INTRAVENOUS

## 2017-01-27 NOTE — Patient Instructions (Signed)
[facility] Discharge Instructions for Patients Receiving Chemotherapy  Today you received the following chemotherapy agent: Darzalex  To help prevent nausea and vomiting after your treatment, we encourage you to take your nausea medication as directed   If you develop nausea and vomiting that is not controlled by your nausea medication, call the clinic.   BELOW ARE SYMPTOMS THAT SHOULD BE REPORTED IMMEDIATELY:  *FEVER GREATER THAN 100.5 F  *CHILLS WITH OR WITHOUT FEVER  NAUSEA AND VOMITING THAT IS NOT CONTROLLED WITH YOUR NAUSEA MEDICATION  *UNUSUAL SHORTNESS OF BREATH  *UNUSUAL BRUISING OR BLEEDING  TENDERNESS IN MOUTH AND THROAT WITH OR WITHOUT PRESENCE OF ULCERS  *URINARY PROBLEMS  *BOWEL PROBLEMS  UNUSUAL RASH Items with * indicate a potential emergency and should be followed up as soon as possible.  Feel free to call the clinic should you have any questions or concerns. The clinic phone number is [phone].  Please show the CHEMO ALERT CARD at check-in to the Emergency Department and triage nurse.  Daratumumab injection What is this medicine? DARATUMUMAB (dar a toom ue mab) is a monoclonal antibody. It is used to treat multiple myeloma. This medicine may be used for other purposes; ask your health care provider or pharmacist if you have questions. COMMON BRAND NAME(S): DARZALEX What should I tell my health care provider before I take this medicine? They need to know if you have any of these conditions: -infection (especially a virus infection such as chickenpox, cold sores, or herpes) -lung or breathing disease -pregnant or trying to get pregnant -breast-feeding -an unusual or allergic reaction to daratumumab, other medicines, foods, dyes, or preservatives How should I use this medicine? This medicine is for infusion into a vein. It is given by a health care professional in a hospital or clinic setting. Talk to your pediatrician regarding the  use of this medicine in children. Special care may be needed. Overdosage: If you think you have taken too much of this medicine contact a poison control center or emergency room at once. NOTE: This medicine is only for you. Do not share this medicine with others. What if I miss a dose? Keep appointments for follow-up doses as directed. It is important not to miss your dose. Call your doctor or health care professional if you are unable to keep an appointment. What may interact with this medicine? Interactions have not been studied. Give your health care provider a list of all the medicines, herbs, non-prescription drugs, or dietary supplements you use. Also tell them if you smoke, drink alcohol, or use illegal drugs. Some items may interact with your medicine. This list may not describe all possible interactions. Give your health care provider a list of all the medicines, herbs, non-prescription drugs, or dietary supplements you use. Also tell them if you smoke, drink alcohol, or use illegal drugs. Some items may interact with your medicine. What should I watch for while using this medicine? This drug may make you feel generally unwell. Report any side effects. Continue your course of treatment even though you feel ill unless your doctor tells you to stop. This medicine can cause serious allergic reactions. To reduce your risk you may need to take medicine before treatment with this medicine. Take your medicine as directed. This medicine can affect the results of blood tests to match your blood type. These changes can last for up to 6 months after the final dose. Your healthcare provider will do blood tests to match your blood type   before you start treatment. Tell all of your healthcare providers that you are being treated with this medicine before receiving a blood transfusion. This medicine can affect the results of some tests used to determine treatment response; extra tests may be needed to evaluate  response. Do not become pregnant while taking this medicine or for 3 months after stopping it. Women should inform their doctor if they wish to become pregnant or think they might be pregnant. There is a potential for serious side effects to an unborn child. Talk to your health care professional or pharmacist for more information. What side effects may I notice from receiving this medicine? Side effects that you should report to your doctor or health care professional as soon as possible: -allergic reactions like skin rash, itching or hives, swelling of the face, lips, or tongue -breathing problems -chills -cough -dizziness -feeling faint or lightheaded -headache -low blood counts - this medicine may decrease the number of white blood cells, red blood cells and platelets. You may be at increased risk for infections and bleeding. -nausea, vomiting -shortness of breath -signs of decreased platelets or bleeding - bruising, pinpoint red spots on the skin, black, tarry stools, blood in the urine -signs of decreased red blood cells - unusually weak or tired, feeling faint or lightheaded, falls -signs of infection - fever or chills, cough, sore throat, pain or difficulty passing urine Side effects that usually do not require medical attention (report to your doctor or health care professional if they continue or are bothersome): -back pain -diarrhea -muscle cramps -pain, tingling, numbness in the hands or feet -swelling of the ankles, feet, hands -tiredness This list may not describe all possible side effects. Call your doctor for medical advice about side effects. You may report side effects to FDA at 1-800-FDA-1088. Where should I keep my medicine? Keep out of the reach of children. This drug is given in a hospital or clinic and will not be stored at home. NOTE: This sheet is a summary. It may not cover all possible information. If you have questions about this medicine, talk to your doctor,  pharmacist, or health care provider.  2018 [employer]vier/Gold Standard (2015-02-16 10:38:11)  

## 2017-01-30 ENCOUNTER — Telehealth: Payer: Self-pay

## 2017-01-30 NOTE — Telephone Encounter (Signed)
Attempted to reach pt by phone regarding dental clearance for Zometa.  NO answer, no vm at number provided.

## 2017-01-30 NOTE — Telephone Encounter (Signed)
Spoke with pt by phone regarding the need for dental clearance prior to next zometa treatment.   Pt instructed to call the number on his medicaid card and ask for a list of dentists who accept medicaid.  Pt verbalized understanding.  Pt aware that he has an appt on 02/03/17 for daratumumab.

## 2017-02-03 ENCOUNTER — Other Ambulatory Visit: Payer: Self-pay | Admitting: *Deleted

## 2017-02-03 ENCOUNTER — Inpatient Hospital Stay: Payer: Medicaid Other

## 2017-02-03 ENCOUNTER — Inpatient Hospital Stay: Payer: Medicaid Other | Attending: Hematology and Oncology

## 2017-02-03 VITALS — BP 112/68 | HR 66 | Temp 97.9°F | Resp 16

## 2017-02-03 DIAGNOSIS — G620 Drug-induced polyneuropathy: Secondary | ICD-10-CM | POA: Insufficient documentation

## 2017-02-03 DIAGNOSIS — T451X5A Adverse effect of antineoplastic and immunosuppressive drugs, initial encounter: Secondary | ICD-10-CM | POA: Diagnosis not present

## 2017-02-03 DIAGNOSIS — C9000 Multiple myeloma not having achieved remission: Secondary | ICD-10-CM

## 2017-02-03 DIAGNOSIS — G893 Neoplasm related pain (acute) (chronic): Secondary | ICD-10-CM | POA: Insufficient documentation

## 2017-02-03 DIAGNOSIS — K5909 Other constipation: Secondary | ICD-10-CM | POA: Diagnosis not present

## 2017-02-03 DIAGNOSIS — Z5112 Encounter for antineoplastic immunotherapy: Secondary | ICD-10-CM | POA: Diagnosis present

## 2017-02-03 LAB — COMPREHENSIVE METABOLIC PANEL
ALK PHOS: 39 U/L — AB (ref 40–150)
ALT: 18 U/L (ref 0–55)
AST: 11 U/L (ref 5–34)
Albumin: 4 g/dL (ref 3.5–5.0)
Anion gap: 6 (ref 3–11)
BUN: 21 mg/dL (ref 7–26)
CALCIUM: 9.7 mg/dL (ref 8.4–10.4)
CO2: 23 mmol/L (ref 22–29)
CREATININE: 0.95 mg/dL (ref 0.70–1.30)
Chloride: 110 mmol/L — ABNORMAL HIGH (ref 98–109)
Glucose, Bld: 101 mg/dL (ref 70–140)
Potassium: 4.1 mmol/L (ref 3.5–5.1)
Sodium: 139 mmol/L (ref 136–145)
Total Bilirubin: 0.6 mg/dL (ref 0.2–1.2)
Total Protein: 6.6 g/dL (ref 6.4–8.3)

## 2017-02-03 LAB — CBC WITH DIFFERENTIAL/PLATELET
Abs Granulocyte: 3.5 10*3/uL (ref 1.5–6.5)
BASOS ABS: 0.1 10*3/uL (ref 0.0–0.1)
Basophils Relative: 1 %
EOS PCT: 0 %
Eosinophils Absolute: 0 10*3/uL (ref 0.0–0.5)
HCT: 38.2 % — ABNORMAL LOW (ref 38.4–49.9)
Hemoglobin: 12.9 g/dL — ABNORMAL LOW (ref 13.0–17.1)
LYMPHS ABS: 1.1 10*3/uL (ref 0.9–3.3)
Lymphocytes Relative: 17 %
MCH: 31.2 pg (ref 27.2–33.4)
MCHC: 33.8 g/dL (ref 32.0–36.0)
MCV: 92.1 fL (ref 79.3–98.0)
Monocytes Absolute: 1.5 10*3/uL — ABNORMAL HIGH (ref 0.1–0.9)
Monocytes Relative: 25 %
NEUTROS PCT: 57 %
Neutro Abs: 3.5 10*3/uL (ref 1.5–6.5)
PLATELETS: 201 10*3/uL (ref 140–400)
RBC: 4.15 MIL/uL — ABNORMAL LOW (ref 4.20–5.82)
RDW: 13.5 % (ref 11.0–15.6)
WBC: 6.2 10*3/uL (ref 4.0–10.3)

## 2017-02-03 MED ORDER — SODIUM CHLORIDE 0.9% FLUSH
10.0000 mL | INTRAVENOUS | Status: DC | PRN
  Administered 2017-02-03: 10 mL
  Filled 2017-02-03: qty 10

## 2017-02-03 MED ORDER — POMALIDOMIDE 3 MG PO CAPS: 3.0000 mg | ORAL_CAPSULE | Freq: Every day | ORAL | 0 refills | Status: DC

## 2017-02-03 MED ORDER — PROCHLORPERAZINE MALEATE 10 MG PO TABS
10.0000 mg | ORAL_TABLET | Freq: Once | ORAL | Status: AC
  Administered 2017-02-03: 10 mg via ORAL

## 2017-02-03 MED ORDER — SODIUM CHLORIDE 0.9 % IV SOLN
Freq: Once | INTRAVENOUS | Status: AC
  Administered 2017-02-03: 15:00:00 via INTRAVENOUS

## 2017-02-03 MED ORDER — PROCHLORPERAZINE MALEATE 10 MG PO TABS
ORAL_TABLET | ORAL | Status: AC
  Filled 2017-02-03: qty 1

## 2017-02-03 MED ORDER — DEXAMETHASONE SODIUM PHOSPHATE 100 MG/10ML IJ SOLN
20.0000 mg | Freq: Once | INTRAMUSCULAR | Status: AC
  Administered 2017-02-03: 20 mg via INTRAVENOUS
  Filled 2017-02-03: qty 2

## 2017-02-03 MED ORDER — ACETAMINOPHEN 325 MG PO TABS
ORAL_TABLET | ORAL | Status: AC
  Filled 2017-02-03: qty 2

## 2017-02-03 MED ORDER — SODIUM CHLORIDE 0.9% FLUSH
10.0000 mL | Freq: Once | INTRAVENOUS | Status: AC
  Administered 2017-02-03: 10 mL
  Filled 2017-02-03: qty 10

## 2017-02-03 MED ORDER — SODIUM CHLORIDE 0.9% FLUSH
3.0000 mL | INTRAVENOUS | Status: DC | PRN
  Filled 2017-02-03: qty 10

## 2017-02-03 MED ORDER — ANTICOAGULANT SODIUM CITRATE 4% (200MG/5ML) IV SOLN
5.0000 mL | Freq: Once | Status: AC
  Administered 2017-02-03: 5 mL via INTRAVENOUS
  Filled 2017-02-03: qty 5

## 2017-02-03 MED ORDER — DIPHENHYDRAMINE HCL 25 MG PO CAPS
ORAL_CAPSULE | ORAL | Status: AC
  Filled 2017-02-03: qty 2

## 2017-02-03 MED ORDER — SODIUM CHLORIDE 0.9 % IV SOLN
16.2000 mg/kg | Freq: Once | INTRAVENOUS | Status: AC
  Administered 2017-02-03: 1700 mg via INTRAVENOUS
  Filled 2017-02-03: qty 80

## 2017-02-03 MED ORDER — ACETAMINOPHEN 325 MG PO TABS
650.0000 mg | ORAL_TABLET | Freq: Once | ORAL | Status: AC
  Administered 2017-02-03: 650 mg via ORAL

## 2017-02-03 MED ORDER — DIPHENHYDRAMINE HCL 25 MG PO CAPS
50.0000 mg | ORAL_CAPSULE | Freq: Once | ORAL | Status: AC
  Administered 2017-02-03: 50 mg via ORAL

## 2017-02-03 NOTE — Patient Instructions (Signed)
[facility] Discharge Instructions for Patients Receiving Chemotherapy  Today you received the following chemotherapy agent: Darzalex  To help prevent nausea and vomiting after your treatment, we encourage you to take your nausea medication as directed   If you develop nausea and vomiting that is not controlled by your nausea medication, call the clinic.   BELOW ARE SYMPTOMS THAT SHOULD BE REPORTED IMMEDIATELY:  *FEVER GREATER THAN 100.5 F  *CHILLS WITH OR WITHOUT FEVER  NAUSEA AND VOMITING THAT IS NOT CONTROLLED WITH YOUR NAUSEA MEDICATION  *UNUSUAL SHORTNESS OF BREATH  *UNUSUAL BRUISING OR BLEEDING  TENDERNESS IN MOUTH AND THROAT WITH OR WITHOUT PRESENCE OF ULCERS  *URINARY PROBLEMS  *BOWEL PROBLEMS  UNUSUAL RASH Items with * indicate a potential emergency and should be followed up as soon as possible.  Feel free to call the clinic should you have any questions or concerns. The clinic phone number is [phone].  Please show the CHEMO ALERT CARD at check-in to the Emergency Department and triage nurse.  Daratumumab injection What is this medicine? DARATUMUMAB (dar a toom ue mab) is a monoclonal antibody. It is used to treat multiple myeloma. This medicine may be used for other purposes; ask your health care provider or pharmacist if you have questions. COMMON BRAND NAME(S): DARZALEX What should I tell my health care provider before I take this medicine? They need to know if you have any of these conditions: -infection (especially a virus infection such as chickenpox, cold sores, or herpes) -lung or breathing disease -pregnant or trying to get pregnant -breast-feeding -an unusual or allergic reaction to daratumumab, other medicines, foods, dyes, or preservatives How should I use this medicine? This medicine is for infusion into a vein. It is given by a health care professional in a hospital or clinic setting. Talk to your pediatrician regarding the  use of this medicine in children. Special care may be needed. Overdosage: If you think you have taken too much of this medicine contact a poison control center or emergency room at once. NOTE: This medicine is only for you. Do not share this medicine with others. What if I miss a dose? Keep appointments for follow-up doses as directed. It is important not to miss your dose. Call your doctor or health care professional if you are unable to keep an appointment. What may interact with this medicine? Interactions have not been studied. Give your health care provider a list of all the medicines, herbs, non-prescription drugs, or dietary supplements you use. Also tell them if you smoke, drink alcohol, or use illegal drugs. Some items may interact with your medicine. This list may not describe all possible interactions. Give your health care provider a list of all the medicines, herbs, non-prescription drugs, or dietary supplements you use. Also tell them if you smoke, drink alcohol, or use illegal drugs. Some items may interact with your medicine. What should I watch for while using this medicine? This drug may make you feel generally unwell. Report any side effects. Continue your course of treatment even though you feel ill unless your doctor tells you to stop. This medicine can cause serious allergic reactions. To reduce your risk you may need to take medicine before treatment with this medicine. Take your medicine as directed. This medicine can affect the results of blood tests to match your blood type. These changes can last for up to 6 months after the final dose. Your healthcare provider will do blood tests to match your blood type   before you start treatment. Tell all of your healthcare providers that you are being treated with this medicine before receiving a blood transfusion. This medicine can affect the results of some tests used to determine treatment response; extra tests may be needed to evaluate  response. Do not become pregnant while taking this medicine or for 3 months after stopping it. Women should inform their doctor if they wish to become pregnant or think they might be pregnant. There is a potential for serious side effects to an unborn child. Talk to your health care professional or pharmacist for more information. What side effects may I notice from receiving this medicine? Side effects that you should report to your doctor or health care professional as soon as possible: -allergic reactions like skin rash, itching or hives, swelling of the face, lips, or tongue -breathing problems -chills -cough -dizziness -feeling faint or lightheaded -headache -low blood counts - this medicine may decrease the number of white blood cells, red blood cells and platelets. You may be at increased risk for infections and bleeding. -nausea, vomiting -shortness of breath -signs of decreased platelets or bleeding - bruising, pinpoint red spots on the skin, black, tarry stools, blood in the urine -signs of decreased red blood cells - unusually weak or tired, feeling faint or lightheaded, falls -signs of infection - fever or chills, cough, sore throat, pain or difficulty passing urine Side effects that usually do not require medical attention (report to your doctor or health care professional if they continue or are bothersome): -back pain -diarrhea -muscle cramps -pain, tingling, numbness in the hands or feet -swelling of the ankles, feet, hands -tiredness This list may not describe all possible side effects. Call your doctor for medical advice about side effects. You may report side effects to FDA at 1-800-FDA-1088. Where should I keep my medicine? Keep out of the reach of children. This drug is given in a hospital or clinic and will not be stored at home. NOTE: This sheet is a summary. It may not cover all possible information. If you have questions about this medicine, talk to your doctor,  pharmacist, or health care provider.  2018 Elsevier/Gold Standard (2015-02-16 10:38:11)  

## 2017-02-04 ENCOUNTER — Encounter: Payer: Self-pay | Admitting: Pharmacist

## 2017-02-04 NOTE — Addendum Note (Signed)
Addended by: Tora Kindred on: 02/04/2017 09:50 AM   Modules accepted: Orders

## 2017-02-10 ENCOUNTER — Inpatient Hospital Stay: Payer: Medicaid Other

## 2017-02-10 ENCOUNTER — Other Ambulatory Visit: Payer: Self-pay

## 2017-02-10 VITALS — BP 137/78 | HR 76 | Temp 98.1°F | Resp 18 | Wt 236.0 lb

## 2017-02-10 DIAGNOSIS — C9000 Multiple myeloma not having achieved remission: Secondary | ICD-10-CM

## 2017-02-10 DIAGNOSIS — Z5112 Encounter for antineoplastic immunotherapy: Secondary | ICD-10-CM | POA: Diagnosis not present

## 2017-02-10 LAB — COMPREHENSIVE METABOLIC PANEL
ALK PHOS: 43 U/L (ref 40–150)
ALT: 19 U/L (ref 0–55)
ANION GAP: 7 (ref 3–11)
AST: 10 U/L (ref 5–34)
Albumin: 3.8 g/dL (ref 3.5–5.0)
BILIRUBIN TOTAL: 0.6 mg/dL (ref 0.2–1.2)
BUN: 25 mg/dL (ref 7–26)
CALCIUM: 9.6 mg/dL (ref 8.4–10.4)
CO2: 23 mmol/L (ref 22–29)
Chloride: 109 mmol/L (ref 98–109)
Creatinine, Ser: 0.91 mg/dL (ref 0.70–1.30)
GFR calc Af Amer: >60 mL/min (ref >60)
GFR calc non Af Amer: >60 mL/min (ref >60)
Glucose, Bld: 113 mg/dL (ref 70–140)
POTASSIUM: 4 mmol/L (ref 3.5–5.1)
SODIUM: 139 mmol/L (ref 136–145)
TOTAL PROTEIN: 6.5 g/dL (ref 6.4–8.3)

## 2017-02-10 LAB — CBC WITH DIFFERENTIAL/PLATELET
BASOS ABS: 0 10*3/uL (ref 0.0–0.1)
BASOS PCT: 0 %
Eosinophils Absolute: 0 10*3/uL (ref 0.0–0.5)
Eosinophils Relative: 1 %
HEMATOCRIT: 39.7 % (ref 38.4–49.9)
HEMOGLOBIN: 13.2 g/dL (ref 13.0–17.1)
Lymphocytes Relative: 11 %
Lymphs Abs: 0.9 10*3/uL (ref 0.9–3.3)
MCH: 31.1 pg (ref 27.2–33.4)
MCHC: 33.2 g/dL (ref 32.0–36.0)
MCV: 93.6 fL (ref 79.3–98.0)
Monocytes Absolute: 1 10*3/uL — ABNORMAL HIGH (ref 0.1–0.9)
Monocytes Relative: 12 %
NEUTROS ABS: 6.3 10*3/uL (ref 1.5–6.5)
NEUTROS PCT: 76 %
Platelets: 166 10*3/uL (ref 140–400)
RBC: 4.23 MIL/uL (ref 4.20–5.82)
RDW: 13.9 % (ref 11.0–15.6)
WBC: 8.3 10*3/uL (ref 4.0–10.3)

## 2017-02-10 MED ORDER — SODIUM CHLORIDE 0.9% FLUSH
10.0000 mL | INTRAVENOUS | Status: DC | PRN
  Administered 2017-02-10: 10 mL
  Filled 2017-02-10: qty 10

## 2017-02-10 MED ORDER — SODIUM CHLORIDE 0.9 % IV SOLN
1700.0000 mg | Freq: Once | INTRAVENOUS | Status: AC
  Administered 2017-02-10: 1700 mg via INTRAVENOUS
  Filled 2017-02-10: qty 80

## 2017-02-10 MED ORDER — SODIUM CHLORIDE 0.9 % IV SOLN
Freq: Once | INTRAVENOUS | Status: AC
  Administered 2017-02-10: 10:00:00 via INTRAVENOUS

## 2017-02-10 MED ORDER — OXYCODONE HCL 10 MG PO TABS: 10.0000 mg | ORAL_TABLET | Freq: Four times a day (QID) | ORAL | 0 refills | Status: DC | PRN

## 2017-02-10 MED ORDER — PROCHLORPERAZINE MALEATE 10 MG PO TABS
ORAL_TABLET | ORAL | Status: AC
  Filled 2017-02-10: qty 1

## 2017-02-10 MED ORDER — ACETAMINOPHEN 325 MG PO TABS
650.0000 mg | ORAL_TABLET | Freq: Once | ORAL | Status: AC
  Administered 2017-02-10: 650 mg via ORAL

## 2017-02-10 MED ORDER — PROCHLORPERAZINE MALEATE 10 MG PO TABS
10.0000 mg | ORAL_TABLET | Freq: Once | ORAL | Status: AC
  Administered 2017-02-10: 10 mg via ORAL

## 2017-02-10 MED ORDER — DIPHENHYDRAMINE HCL 25 MG PO CAPS
50.0000 mg | ORAL_CAPSULE | Freq: Once | ORAL | Status: AC
  Administered 2017-02-10: 50 mg via ORAL

## 2017-02-10 MED ORDER — ANTICOAGULANT SODIUM CITRATE 4% (200MG/5ML) IV SOLN
5.0000 mL | Freq: Once | Status: AC
  Administered 2017-02-10: 5 mL via INTRAVENOUS
  Filled 2017-02-10: qty 5

## 2017-02-10 MED ORDER — SODIUM CHLORIDE 0.9 % IV SOLN
20.0000 mg | Freq: Once | INTRAVENOUS | Status: AC
  Administered 2017-02-10: 20 mg via INTRAVENOUS
  Filled 2017-02-10: qty 2

## 2017-02-10 MED ORDER — DIPHENHYDRAMINE HCL 25 MG PO CAPS
ORAL_CAPSULE | ORAL | Status: AC
  Filled 2017-02-10: qty 2

## 2017-02-10 MED ORDER — ACETAMINOPHEN 325 MG PO TABS
ORAL_TABLET | ORAL | Status: AC
  Filled 2017-02-10: qty 2

## 2017-02-10 MED FILL — oxyCODONE HCL 10 MG TABS: 10 | 22 days supply | Qty: 90 | Fill #0

## 2017-02-10 NOTE — Patient Instructions (Signed)
[facility] Discharge Instructions for Patients Receiving Chemotherapy  Today you received the following chemotherapy agents: Darzalex  To help prevent nausea and vomiting after your treatment, we encourage you to take your nausea medication as directed.    If you develop nausea and vomiting that is not controlled by your nausea medication, call the clinic.   BELOW ARE SYMPTOMS THAT SHOULD BE REPORTED IMMEDIATELY:  *FEVER GREATER THAN 100.5 F  *CHILLS WITH OR WITHOUT FEVER  NAUSEA AND VOMITING THAT IS NOT CONTROLLED WITH YOUR NAUSEA MEDICATION  *UNUSUAL SHORTNESS OF BREATH  *UNUSUAL BRUISING OR BLEEDING  TENDERNESS IN MOUTH AND THROAT WITH OR WITHOUT PRESENCE OF ULCERS  *URINARY PROBLEMS  *BOWEL PROBLEMS  UNUSUAL RASH Items with * indicate a potential emergency and should be followed up as soon as possible.  Feel free to call the clinic should you have any questions or concerns. The clinic phone number is [phone].  Please show the CHEMO ALERT CARD at check-in to the Emergency Department and triage nurse.   

## 2017-02-11 LAB — KAPPA/LAMBDA LIGHT CHAINS
KAPPA FREE LGHT CHN: 1.8 mg/L — AB (ref 3.3–19.4)
KAPPA, LAMDA LIGHT CHAIN RATIO: 0.11 — AB (ref 0.26–1.65)
LAMDA FREE LIGHT CHAINS: 17 mg/L (ref 5.7–26.3)

## 2017-02-17 ENCOUNTER — Inpatient Hospital Stay: Payer: Medicaid Other

## 2017-02-17 ENCOUNTER — Encounter: Payer: Self-pay | Admitting: Hematology and Oncology

## 2017-02-17 ENCOUNTER — Inpatient Hospital Stay (HOSPITAL_BASED_OUTPATIENT_CLINIC_OR_DEPARTMENT_OTHER): Payer: Medicaid Other | Admitting: Hematology and Oncology

## 2017-02-17 ENCOUNTER — Telehealth: Payer: Self-pay | Admitting: Hematology and Oncology

## 2017-02-17 VITALS — BP 121/74 | HR 88 | Temp 98.1°F | Resp 18

## 2017-02-17 DIAGNOSIS — T451X5A Adverse effect of antineoplastic and immunosuppressive drugs, initial encounter: Secondary | ICD-10-CM

## 2017-02-17 DIAGNOSIS — K5909 Other constipation: Secondary | ICD-10-CM

## 2017-02-17 DIAGNOSIS — G620 Drug-induced polyneuropathy: Secondary | ICD-10-CM

## 2017-02-17 DIAGNOSIS — C9000 Multiple myeloma not having achieved remission: Secondary | ICD-10-CM

## 2017-02-17 DIAGNOSIS — Z5112 Encounter for antineoplastic immunotherapy: Secondary | ICD-10-CM | POA: Diagnosis not present

## 2017-02-17 DIAGNOSIS — G893 Neoplasm related pain (acute) (chronic): Secondary | ICD-10-CM

## 2017-02-17 LAB — COMPREHENSIVE METABOLIC PANEL
ALT: 21 U/L (ref 0–55)
AST: 11 U/L (ref 5–34)
Albumin: 3.7 g/dL (ref 3.5–5.0)
Alkaline Phosphatase: 45 U/L (ref 40–150)
Anion gap: 9 (ref 3–11)
BILIRUBIN TOTAL: 0.7 mg/dL (ref 0.2–1.2)
BUN: 28 mg/dL — AB (ref 7–26)
CO2: 21 mmol/L — ABNORMAL LOW (ref 22–29)
CREATININE: 0.98 mg/dL (ref 0.70–1.30)
Calcium: 9.5 mg/dL (ref 8.4–10.4)
Chloride: 108 mmol/L (ref 98–109)
Glucose, Bld: 117 mg/dL (ref 70–140)
POTASSIUM: 4.3 mmol/L (ref 3.5–5.1)
Sodium: 138 mmol/L (ref 136–145)
TOTAL PROTEIN: 6.6 g/dL (ref 6.4–8.3)

## 2017-02-17 LAB — CBC WITH DIFFERENTIAL/PLATELET
BASOS ABS: 0 10*3/uL (ref 0.0–0.1)
Basophils Relative: 0 %
Eosinophils Absolute: 0 10*3/uL (ref 0.0–0.5)
Eosinophils Relative: 0 %
HEMATOCRIT: 38.4 % (ref 38.4–49.9)
Hemoglobin: 13.1 g/dL (ref 13.0–17.1)
LYMPHS ABS: 0.7 10*3/uL — AB (ref 0.9–3.3)
Lymphocytes Relative: 7 %
MCH: 31.7 pg (ref 27.2–33.4)
MCHC: 34 g/dL (ref 32.0–36.0)
MCV: 93.3 fL (ref 79.3–98.0)
MONO ABS: 1.2 10*3/uL — AB (ref 0.1–0.9)
Monocytes Relative: 12 %
NEUTROS ABS: 8 10*3/uL — AB (ref 1.5–6.5)
Neutrophils Relative %: 81 %
Platelets: 154 10*3/uL (ref 140–400)
RBC: 4.12 MIL/uL — ABNORMAL LOW (ref 4.20–5.82)
RDW: 14.1 % (ref 11.0–15.6)
WBC: 9.9 10*3/uL (ref 4.0–10.3)

## 2017-02-17 MED ORDER — PROCHLORPERAZINE MALEATE 10 MG PO TABS
ORAL_TABLET | ORAL | Status: AC
  Filled 2017-02-17: qty 1

## 2017-02-17 MED ORDER — DIPHENHYDRAMINE HCL 25 MG PO CAPS
50.0000 mg | ORAL_CAPSULE | Freq: Once | ORAL | Status: AC
  Administered 2017-02-17: 50 mg via ORAL

## 2017-02-17 MED ORDER — ANTICOAGULANT SODIUM CITRATE 4% (200MG/5ML) IV SOLN
5.0000 mL | Freq: Once | Status: AC
  Administered 2017-02-17: 5 mL via INTRAVENOUS
  Filled 2017-02-17: qty 5

## 2017-02-17 MED ORDER — SODIUM CHLORIDE 0.9% FLUSH
10.0000 mL | Freq: Once | INTRAVENOUS | Status: AC
  Administered 2017-02-17: 10 mL
  Filled 2017-02-17: qty 10

## 2017-02-17 MED ORDER — ACETAMINOPHEN 325 MG PO TABS
650.0000 mg | ORAL_TABLET | Freq: Once | ORAL | Status: AC
  Administered 2017-02-17: 650 mg via ORAL

## 2017-02-17 MED ORDER — SODIUM CHLORIDE 0.9 % IV SOLN
Freq: Once | INTRAVENOUS | Status: AC
  Administered 2017-02-17: 11:00:00 via INTRAVENOUS

## 2017-02-17 MED ORDER — SODIUM CHLORIDE 0.9 % IV SOLN
20.0000 mg | Freq: Once | INTRAVENOUS | Status: AC
  Administered 2017-02-17: 20 mg via INTRAVENOUS
  Filled 2017-02-17: qty 2

## 2017-02-17 MED ORDER — SODIUM CHLORIDE 0.9 % IV SOLN
16.1000 mg/kg | Freq: Once | INTRAVENOUS | Status: AC
  Administered 2017-02-17: 1700 mg via INTRAVENOUS
  Filled 2017-02-17: qty 80

## 2017-02-17 MED ORDER — PROCHLORPERAZINE MALEATE 10 MG PO TABS
10.0000 mg | ORAL_TABLET | Freq: Once | ORAL | Status: AC
  Administered 2017-02-17: 10 mg via ORAL

## 2017-02-17 MED ORDER — ACETAMINOPHEN 325 MG PO TABS
ORAL_TABLET | ORAL | Status: AC
  Filled 2017-02-17: qty 2

## 2017-02-17 MED ORDER — DIPHENHYDRAMINE HCL 25 MG PO CAPS
ORAL_CAPSULE | ORAL | Status: AC
Start: 2017-02-17 — End: 2017-02-17
  Filled 2017-02-17: qty 2

## 2017-02-17 MED ORDER — HEPARIN SOD (PORK) LOCK FLUSH 100 UNIT/ML IV SOLN
500.0000 [IU] | Freq: Once | INTRAVENOUS | Status: DC | PRN
  Filled 2017-02-17: qty 5

## 2017-02-17 MED ORDER — SODIUM CHLORIDE 0.9% FLUSH
10.0000 mL | INTRAVENOUS | Status: DC | PRN
  Administered 2017-02-17: 10 mL
  Filled 2017-02-17: qty 10

## 2017-02-17 NOTE — Patient Instructions (Signed)
[facility] Discharge Instructions for Patients Receiving Chemotherapy  Today you received the following chemotherapy agents: Darzalex  To help prevent nausea and vomiting after your treatment, we encourage you to take your nausea medication as directed.    If you develop nausea and vomiting that is not controlled by your nausea medication, call the clinic.   BELOW ARE SYMPTOMS THAT SHOULD BE REPORTED IMMEDIATELY:  *FEVER GREATER THAN 100.5 F  *CHILLS WITH OR WITHOUT FEVER  NAUSEA AND VOMITING THAT IS NOT CONTROLLED WITH YOUR NAUSEA MEDICATION  *UNUSUAL SHORTNESS OF BREATH  *UNUSUAL BRUISING OR BLEEDING  TENDERNESS IN MOUTH AND THROAT WITH OR WITHOUT PRESENCE OF ULCERS  *URINARY PROBLEMS  *BOWEL PROBLEMS  UNUSUAL RASH Items with * indicate a potential emergency and should be followed up as soon as possible.  Feel free to call the clinic should you have any questions or concerns. The clinic phone number is [phone].  Please show the CHEMO ALERT CARD at check-in to the Emergency Department and triage nurse.   

## 2017-02-17 NOTE — Assessment & Plan Note (Signed)
He is doing well and it is not bothering him. 

## 2017-02-17 NOTE — Assessment & Plan Note (Signed)
He has chronic constipation I reminded him to take Senokot with a higher dose to keep his bowel habits regular

## 2017-02-17 NOTE — Patient Instructions (Signed)
Implanted Port Home Guide An implanted port is a type of central line that is placed under the skin. Central lines are used to provide IV access when treatment or nutrition needs to be given through a person's veins. Implanted ports are used for long-term IV access. An implanted port may be placed because:  You need IV medicine that would be irritating to the small veins in your hands or arms.  You need long-term IV medicines, such as antibiotics.  You need IV nutrition for a long period.  You need frequent blood draws for lab tests.  You need dialysis.  Implanted ports are usually placed in the chest area, but they can also be placed in the upper arm, the abdomen, or the leg. An implanted port has two main parts:  Reservoir. The reservoir is round and will appear as a small, raised area under your skin. The reservoir is the part where a needle is inserted to give medicines or draw blood.  Catheter. The catheter is a thin, flexible tube that extends from the reservoir. The catheter is placed into a large vein. Medicine that is inserted into the reservoir goes into the catheter and then into the vein.  How will I care for my incision site? Do not get the incision site wet. Bathe or shower as directed by your health care provider. How is my port accessed? Special steps must be taken to access the port:  Before the port is accessed, a numbing cream can be placed on the skin. This helps numb the skin over the port site.  Your health care provider uses a sterile technique to access the port. ? Your health care provider must put on a mask and sterile gloves. ? The skin over your port is cleaned carefully with an antiseptic and allowed to dry. ? The port is gently pinched between sterile gloves, and a needle is inserted into the port.  Only "non-coring" port needles should be used to access the port. Once the port is accessed, a blood return should be checked. This helps ensure that the port  is in the vein and is not clogged.  If your port needs to remain accessed for a constant infusion, a clear (transparent) bandage will be placed over the needle site. The bandage and needle will need to be changed every week, or as directed by your health care provider.  Keep the bandage covering the needle clean and dry. Do not get it wet. Follow your health care provider's instructions on how to take a shower or bath while the port is accessed.  If your port does not need to stay accessed, no bandage is needed over the port.  What is flushing? Flushing helps keep the port from getting clogged. Follow your health care provider's instructions on how and when to flush the port. Ports are usually flushed with saline solution or a medicine called heparin. The need for flushing will depend on how the port is used.  If the port is used for intermittent medicines or blood draws, the port will need to be flushed: ? After medicines have been given. ? After blood has been drawn. ? As part of routine maintenance.  If a constant infusion is running, the port may not need to be flushed.  How long will my port stay implanted? The port can stay in for as long as your health care provider thinks it is needed. When it is time for the port to come out, surgery will be   done to remove it. The procedure is similar to the one performed when the port was put in. When should I seek immediate medical care? When you have an implanted port, you should seek immediate medical care if:  You notice a bad smell coming from the incision site.  You have swelling, redness, or drainage at the incision site.  You have more swelling or pain at the port site or the surrounding area.  You have a fever that is not controlled with medicine.  This information is not intended to replace advice given to you by your health care provider. Make sure you discuss any questions you have with your health care provider. Document  Released: 01/14/2005 Document Revised: 06/22/2015 Document Reviewed: 09/21/2012 Elsevier Interactive Patient Education  2017 [employer] Inc.  

## 2017-02-17 NOTE — Progress Notes (Signed)
[place] OFFICE PROGRESS NOTE  Patient Care Team: [name], MD as PCP - General (Internal Medicine)  SUMMARY OF ONCOLOGIC HISTORY:   Multiple myeloma not having achieved remission [facility])   06/23/2015 - 06/28/2015 Hospital Admission    The patient was admitted to the hospital due to gait ataxia and back pain. He was subsequently found to have cord compression underwent surgery and was discharged home      06/24/2015 Imaging    Abnormal appearance of the T6 vertebral body, highly suspicious for possible osseous metastasis. Associated pathologic fracture withup to 30% height loss. There is associated abnormal soft tissue density within the ventral epidural space,      06/24/2015 Imaging    MRI lumbar: Focal osseous lesion with abnormal enhancement involving the right pedicle of L3, suspicious for possible osseous metastasisgiven the findings in the thoracic spine. Question additional focal lesion within the right iliac wing as above.        06/25/2015 Pathology Results    Accession: [id number] bone biopsy come from plasma cell neoplasm.      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 Zometa is not given due to inability to get dental clearance      12/14/2015 - 04/24/2016 Chemotherapy    He is started on maintenance treatment with Revlimid only      12/18/2015 Imaging    MRI thoracic and lumbar spine showed numerous enhancing foci throughout the thoracic and lumbar spine with several new small foci in the lumbar spine in comparison with prior MRI compatible with metastatic disease. Stable loss of height of the T3, T4, and T6 vertebral bodies and new postsurgical  changes related to T6 laminectomy. No significant epidural disease or evidence for cord compression. No abnormal enhancement of the spinal cord or cauda equina.      05/02/2016 Bone Marrow Biopsy    Outside bone marrow biopsy showed 20% myeloma involvement      05/16/2016 Procedure    Successful placement of a right internal jugular approach power injectable Port-A-Cath. The catheter is ready for immediate use.      05/21/2016 - 07/03/2016 Chemotherapy    He received Kyprolis, Cytoxan and dexamethasone       07/08/2016 Procedure    Status post CT-guided bone marrow biopsy, with tissue specimen sent to pathology for complete histopathologic analysis      07/08/2016 Bone Marrow Biopsy    Bone Marrow, Aspirate,Biopsy, and Clot BONE MARROW: - MILDLY HYPERCELLULAR MARROW (60%) WITH PLASMA CELL NEOPLASM - SEE COMMENT PERIPHERAL BLOOD: - NORMOCYTIC ANEMIA Diagnosis Note The marrow is hypercellular with lambda-restricted plasma cells consistent with persistence of the patient's previously diagnosed plasma cell neoplasm. The plasma cells comprise approximately 10-15% of the total marrow cellularity, are enlarged, and arranged in clusters.      08/14/2016 Miscellaneous    He received conditioning treatment with melphalan      08/15/2016 Bone Marrow Transplant    He received autologous stem cell transplant      08/24/2016 - 08/29/2016 Hospital Admission    His post-transplant course was complicated by E-Coli bacteremia      11/26/2016 PET scan    PET  CT at [facility] 1. Technically limited study due to soft tissue uptake. 2. New hypermetabolic uptake at C7 spinous process and left proximal femur that is of questionable significance in absence of underlying CT correlate. Further assessment with whole body bone scan may be considered. 2. Redemonstrated nonhypermetabolic multifocal lucent and sclerotic lesions throughout the spine which are similar to prior.       12/02/2016 Bone Marrow Biopsy     He had repeat bone marrow biopsy at [facility] An immunohistochemical stain for CD138 is performed on the bone marrow core biopsy demonstrates increased plasma cells with focal clustering (10-20% overall), which are monotypic for lambda light chain by in situ hybridization.      01/06/2017 -  Chemotherapy    He received weekly Dexamethasone, Pomalyst days 1-21 and Daratumumab       INTERVAL HISTORY: Please see below for problem oriented charting. He returns for further follow-up He has seen a dentist recently but his dentist is not sure what is needed for him to proceed with treatment His chronic pain is stable Peripheral neuropathy stable He has some mild constipation, but is taking Senokot on a regular basis No recent nausea or vomiting No recent infection  REVIEW OF SYSTEMS:   Constitutional: Denies fevers, chills or abnormal weight loss Eyes: Denies blurriness of vision Ears, nose, mouth, throat, and face: Denies mucositis or sore throat Respiratory: Denies cough, dyspnea or wheezes Cardiovascular: Denies palpitation, chest discomfort or lower extremity swelling Gastrointestinal:  Denies nausea, heartburn or change in bowel habits Skin: Denies abnormal skin rashes Lymphatics: Denies new lymphadenopathy or easy bruising Neurological:Denies numbness, tingling or new weaknesses Behavioral/Psych: Mood is stable, no new changes  All other systems were reviewed with the patient and are negative.  I have reviewed the past medical history, past surgical history, social history and family history with the patient and they are unchanged from previous note.  ALLERGIES:  has No Known Allergies.  MEDICATIONS:  Current Outpatient Medications  Medication Sig Dispense Refill  . acyclovir (ZOVIRAX) 400 MG tablet Take 1 tablet (400 mg total) by mouth 2 (two) times daily. 60 tablet 11  . acyclovir (ZOVIRAX) 800 MG tablet Take 800 mg by mouth 2 (two) times daily.  10  . aspirin EC 81 MG tablet  Take 81 mg by mouth daily.    . cholecalciferol (VITAMIN D) 1000 units tablet Take 1,000 Units by mouth daily.    . Cholecalciferol (VITAMIN D-1000 MAX ST) 1000 units tablet Take 1,000 Units by mouth daily.    Marland Kitchen dexamethasone (DECADRON) 4 MG tablet Take 20 mg by mouth once a week.  0  . lidocaine-prilocaine (EMLA) cream Apply to affected area once (Patient not taking: Reported on 09/06/2016) 30 g 3  . Oxycodone HCl 10 MG TABS Take 1 tablet (10 mg total) by mouth every 6 (six) hours as needed. 90 tablet 0  . pantoprazole (PROTONIX) 40 MG tablet Take 40 mg by mouth daily.  0  . pomalidomide (POMALYST) 3 MG capsule Take 1 capsule (3 mg total) by mouth daily. Take with water for 21 days on,7 days off. Repeat every 28 days. 21 capsule 0  . sulfamethoxazole-trimethoprim (BACTRIM DS,SEPTRA DS) 800-160 MG tablet Take 1 tablet by mouth 3 (three) times a week.     No current facility-administered medications for this visit.    Facility-Administered Medications Ordered in Other Visits  Medication Dose Route Frequency Provider Last Rate Last Dose  . anticoagulant sodium citrate solution 5 mL  5 mL  Intravenous Once Alvy Bimler, Lama Narayanan, MD      . heparin lock flush 100 unit/mL  500 Units Intracatheter Once PRN Alvy Bimler, Akirah Storck, MD      . sodium chloride flush (NS) 0.9 % injection 10 mL  10 mL Intracatheter PRN Alvy Bimler, Lashon Hillier, MD        PHYSICAL EXAMINATION: ECOG PERFORMANCE STATUS: 1 - Symptomatic but completely ambulatory  Vitals:   02/17/17 0952  BP: (!) 142/84  Pulse: 79  Resp: 18  Temp: 97.6 F (36.4 C)  SpO2: 100%   Filed Weights   02/17/17 0952  Weight: 233 lb 1.6 oz (105.7 kg)    GENERAL:alert, no distress and comfortable SKIN: skin color, texture, turgor are normal, no rashes or significant lesions EYES: normal, Conjunctiva are pink and non-injected, sclera clear OROPHARYNX:no exudate, no erythema and lips, buccal mucosa, and tongue normal  NECK: supple, thyroid normal size, non-tender, without  nodularity LYMPH:  no palpable lymphadenopathy in the cervical, axillary or inguinal LUNGS: clear to auscultation and percussion with normal breathing effort HEART: regular rate & rhythm and no murmurs and no lower extremity edema ABDOMEN:abdomen soft, non-tender and normal bowel sounds Musculoskeletal:no cyanosis of digits and no clubbing  NEURO: alert & oriented x 3 with fluent speech, no focal motor/sensory deficits  LABORATORY DATA:  I have reviewed the data as listed    Component Value Date/Time   NA 138 02/17/2017 0809   NA 139 01/27/2017 0809   K 4.3 02/17/2017 0809   K 4.1 01/27/2017 0809   CL 108 02/17/2017 0809   CO2 21 (L) 02/17/2017 0809   CO2 23 01/27/2017 0809   GLUCOSE 117 02/17/2017 0809   GLUCOSE 122 01/27/2017 0809   BUN 28 (H) 02/17/2017 0809   BUN 14.3 01/27/2017 0809   CREATININE 0.98 02/17/2017 0809   CREATININE 0.9 01/27/2017 0809   CALCIUM 9.5 02/17/2017 0809   CALCIUM 9.1 01/27/2017 0809   PROT 6.6 02/17/2017 0809   PROT 6.2 (L) 01/27/2017 0809   ALBUMIN 3.7 02/17/2017 0809   ALBUMIN 3.7 01/27/2017 0809   AST 11 02/17/2017 0809   AST 10 01/27/2017 0809   ALT 21 02/17/2017 0809   ALT 18 01/27/2017 0809   ALKPHOS 45 02/17/2017 0809   ALKPHOS 42 01/27/2017 0809   BILITOT 0.7 02/17/2017 0809   BILITOT 0.72 01/27/2017 0809   GFRNONAA >60 02/17/2017 0809   GFRAA >60 02/17/2017 0809    No results found for: SPEP, UPEP  Lab Results  Component Value Date   WBC 9.9 02/17/2017   NEUTROABS 8.0 (H) 02/17/2017   HGB 13.1 02/17/2017   HCT 38.4 02/17/2017   MCV 93.3 02/17/2017   PLT 154 02/17/2017      Chemistry      Component Value Date/Time   NA 138 02/17/2017 0809   NA 139 01/27/2017 0809   K 4.3 02/17/2017 0809   K 4.1 01/27/2017 0809   CL 108 02/17/2017 0809   CO2 21 (L) 02/17/2017 0809   CO2 23 01/27/2017 0809   BUN 28 (H) 02/17/2017 0809   BUN 14.3 01/27/2017 0809   CREATININE 0.98 02/17/2017 0809   CREATININE 0.9 01/27/2017 0809       Component Value Date/Time   CALCIUM 9.5 02/17/2017 0809   CALCIUM 9.1 01/27/2017 0809   ALKPHOS 45 02/17/2017 0809   ALKPHOS 42 01/27/2017 0809   AST 11 02/17/2017 0809   AST 10 01/27/2017 0809   ALT 21 02/17/2017 0809   ALT 18 01/27/2017 0809  BILITOT 0.7 02/17/2017 0809   BILITOT 0.72 01/27/2017 0809      ASSESSMENT & PLAN:  Multiple myeloma not having achieved remission ([place]) He tolerated treatment well with positive response to treatment. We will proceed with treatment with weekly daratumumab for 9 weeks, weekly dexamethasone 20 mg to be taken on Sundays and Pomalyst to be taken 21 days on 7 days off After the induction phase, he will go on treatment every 2 weeks He will continue aspirin for DVT prophylaxis.  He will continue acyclovir for antimicrobial prophylaxis He is reminded to take calcium and vitamin D He has recently visited a dentist.  We will call the dentist office for dental clearance before we proceed with Zometa I will see him next month for further toxicity review  Cancer associated pain He had persistent cancer back pain He felt better while on oxycodone pain medicine We discussed chronic pain management and narcotic refill policy He is reminded about the risk of sedation, nausea and constipation  Peripheral neuropathy due to chemotherapy [facility]) He is doing well and it is not bothering him.  Constipation, chronic He has chronic constipation I reminded him to take Senokot with a higher dose to keep his bowel habits regular   No orders of the defined types were placed in this encounter.  All questions were answered. The patient knows to call the clinic with any problems, questions or concerns. No barriers to learning was detected. I spent 15 minutes counseling the patient face to face. The total time spent in the appointment was 20 minutes and more than 50% was on counseling and review of test results     [name], MD 02/17/2017 12:17 PM

## 2017-02-17 NOTE — Telephone Encounter (Signed)
Gave patient AVs and calendar of upcoming January and February appointments.  °

## 2017-02-17 NOTE — Assessment & Plan Note (Signed)
He tolerated treatment well with positive response to treatment. We will proceed with treatment with weekly daratumumab for 9 weeks, weekly dexamethasone 20 mg to be taken on Sundays and Pomalyst to be taken 21 days on 7 days off After the induction phase, he will go on treatment every 2 weeks He will continue aspirin for DVT prophylaxis.  He will continue acyclovir for antimicrobial prophylaxis He is reminded to take calcium and vitamin D He has recently visited a dentist.  We will call the dentist office for dental clearance before we proceed with Zometa I will see him next month for further toxicity review

## 2017-02-17 NOTE — Assessment & Plan Note (Signed)
He had persistent cancer back pain He felt better while on oxycodone pain medicine We discussed chronic pain management and narcotic refill policy He is reminded about the risk of sedation, nausea and constipation

## 2017-02-18 ENCOUNTER — Telehealth: Payer: Self-pay

## 2017-02-18 NOTE — Telephone Encounter (Signed)
Called and ask for dental clearance for patient to get Zometa. Fax number given.

## 2017-02-24 ENCOUNTER — Inpatient Hospital Stay: Payer: Medicaid Other

## 2017-02-24 ENCOUNTER — Other Ambulatory Visit: Payer: Self-pay | Admitting: Medical

## 2017-02-24 ENCOUNTER — Ambulatory Visit (HOSPITAL_BASED_OUTPATIENT_CLINIC_OR_DEPARTMENT_OTHER): Payer: Medicaid Other | Admitting: Medical

## 2017-02-24 VITALS — BP 127/73 | HR 95 | Temp 98.9°F | Resp 18 | Wt 235.0 lb

## 2017-02-24 DIAGNOSIS — J0110 Acute frontal sinusitis, unspecified: Secondary | ICD-10-CM | POA: Diagnosis not present

## 2017-02-24 DIAGNOSIS — Z5112 Encounter for antineoplastic immunotherapy: Secondary | ICD-10-CM | POA: Diagnosis not present

## 2017-02-24 DIAGNOSIS — C9000 Multiple myeloma not having achieved remission: Secondary | ICD-10-CM

## 2017-02-24 DIAGNOSIS — R05 Cough: Secondary | ICD-10-CM

## 2017-02-24 DIAGNOSIS — R058 Other specified cough: Secondary | ICD-10-CM

## 2017-02-24 LAB — COMPREHENSIVE METABOLIC PANEL
ALT: 17 U/L (ref 0–55)
AST: 14 U/L (ref 5–34)
Albumin: 3.3 g/dL — ABNORMAL LOW (ref 3.5–5.0)
Alkaline Phosphatase: 52 U/L (ref 40–150)
Anion gap: 8 (ref 3–11)
BILIRUBIN TOTAL: 0.6 mg/dL (ref 0.2–1.2)
BUN: 19 mg/dL (ref 7–26)
CALCIUM: 8.9 mg/dL (ref 8.4–10.4)
CO2: 22 mmol/L (ref 22–29)
CREATININE: 1.03 mg/dL (ref 0.70–1.30)
Chloride: 107 mmol/L (ref 98–109)
GFR calc non Af Amer: >60 mL/min (ref >60)
Glucose, Bld: 125 mg/dL (ref 70–140)
Potassium: 4 mmol/L (ref 3.5–5.1)
Sodium: 137 mmol/L (ref 136–145)
TOTAL PROTEIN: 6.3 g/dL — AB (ref 6.4–8.3)

## 2017-02-24 LAB — CBC WITH DIFFERENTIAL/PLATELET
BASOS ABS: 0 10*3/uL (ref 0.0–0.1)
BASOS PCT: 1 %
EOS ABS: 0.5 10*3/uL (ref 0.0–0.5)
Eosinophils Relative: 17 %
HCT: 36.6 % — ABNORMAL LOW (ref 38.4–49.9)
HEMOGLOBIN: 12.7 g/dL — AB (ref 13.0–17.1)
Lymphocytes Relative: 13 %
Lymphs Abs: 0.4 10*3/uL — ABNORMAL LOW (ref 0.9–3.3)
MCH: 31.7 pg (ref 27.2–33.4)
MCHC: 34.7 g/dL (ref 32.0–36.0)
MCV: 91.3 fL (ref 79.3–98.0)
MONOS PCT: 11 %
Monocytes Absolute: 0.3 10*3/uL (ref 0.1–0.9)
NEUTROS PCT: 58 %
Neutro Abs: 1.8 10*3/uL (ref 1.5–6.5)
Platelets: 142 10*3/uL (ref 140–400)
RBC: 4.01 MIL/uL — ABNORMAL LOW (ref 4.20–5.82)
RDW: 13 % (ref 11.0–15.6)
WBC: 3.1 10*3/uL — ABNORMAL LOW (ref 4.0–10.3)

## 2017-02-24 MED ORDER — PROCHLORPERAZINE MALEATE 10 MG PO TABS
ORAL_TABLET | ORAL | Status: AC
  Filled 2017-02-24: qty 1

## 2017-02-24 MED ORDER — HYDROCOD POLST-CPM POLST ER 10-8 MG/5ML PO SUER: 5.0000 mL | Freq: Every evening | ORAL | 0 refills | Status: DC | PRN

## 2017-02-24 MED ORDER — SODIUM CHLORIDE 0.9% FLUSH
10.0000 mL | INTRAVENOUS | Status: DC | PRN
  Administered 2017-02-24: 10 mL
  Filled 2017-02-24: qty 10

## 2017-02-24 MED ORDER — DEXAMETHASONE SODIUM PHOSPHATE 100 MG/10ML IJ SOLN
20.0000 mg | Freq: Once | INTRAMUSCULAR | Status: AC
  Administered 2017-02-24: 20 mg via INTRAVENOUS
  Filled 2017-02-24: qty 2

## 2017-02-24 MED ORDER — CEFUROXIME AXETIL 500 MG PO TABS: 500.0000 mg | ORAL_TABLET | Freq: Two times a day (BID) | ORAL | 0 refills | Status: AC

## 2017-02-24 MED ORDER — DIPHENHYDRAMINE HCL 25 MG PO CAPS
ORAL_CAPSULE | ORAL | Status: AC
  Filled 2017-02-24: qty 2

## 2017-02-24 MED ORDER — SODIUM CHLORIDE 0.9 % IV SOLN
16.2000 mg/kg | Freq: Once | INTRAVENOUS | Status: AC
  Administered 2017-02-24: 1700 mg via INTRAVENOUS
  Filled 2017-02-24: qty 80

## 2017-02-24 MED ORDER — SODIUM CHLORIDE 0.9 % IV SOLN
Freq: Once | INTRAVENOUS | Status: AC
  Administered 2017-02-24: 11:00:00 via INTRAVENOUS

## 2017-02-24 MED ORDER — PROCHLORPERAZINE MALEATE 10 MG PO TABS
10.0000 mg | ORAL_TABLET | Freq: Once | ORAL | Status: AC
  Administered 2017-02-24: 10 mg via ORAL

## 2017-02-24 MED ORDER — ACETAMINOPHEN 325 MG PO TABS
ORAL_TABLET | ORAL | Status: AC
  Filled 2017-02-24: qty 2

## 2017-02-24 MED ORDER — ANTICOAGULANT SODIUM CITRATE 4% (200MG/5ML) IV SOLN
5.0000 mL | Freq: Once | Status: AC
  Administered 2017-02-24: 5 mL via INTRAVENOUS
  Filled 2017-02-24: qty 5

## 2017-02-24 MED ORDER — DIPHENHYDRAMINE HCL 25 MG PO CAPS
50.0000 mg | ORAL_CAPSULE | Freq: Once | ORAL | Status: AC
  Administered 2017-02-24: 50 mg via ORAL

## 2017-02-24 MED ORDER — ACETAMINOPHEN 325 MG PO TABS
650.0000 mg | ORAL_TABLET | Freq: Once | ORAL | Status: AC
  Administered 2017-02-24: 650 mg via ORAL

## 2017-02-24 NOTE — Patient Instructions (Signed)
[facility] Discharge Instructions for Patients Receiving Chemotherapy  Today you received the following chemotherapy agents: Darzalex  To help prevent nausea and vomiting after your treatment, we encourage you to take your nausea medication as directed.    If you develop nausea and vomiting that is not controlled by your nausea medication, call the clinic.   BELOW ARE SYMPTOMS THAT SHOULD BE REPORTED IMMEDIATELY:  *FEVER GREATER THAN 100.5 F  *CHILLS WITH OR WITHOUT FEVER  NAUSEA AND VOMITING THAT IS NOT CONTROLLED WITH YOUR NAUSEA MEDICATION  *UNUSUAL SHORTNESS OF BREATH  *UNUSUAL BRUISING OR BLEEDING  TENDERNESS IN MOUTH AND THROAT WITH OR WITHOUT PRESENCE OF ULCERS  *URINARY PROBLEMS  *BOWEL PROBLEMS  UNUSUAL RASH Items with * indicate a potential emergency and should be followed up as soon as possible.  Feel free to call the clinic should you have any questions or concerns. The clinic phone number is [phone].  Please show the CHEMO ALERT CARD at check-in to the Emergency Department and triage nurse.   

## 2017-02-24 NOTE — Progress Notes (Signed)
Pt c/o upper respiratory symptoms (congestion, sinus pressure, yellow mucus, mild cough, stated I think I have had a fever, but I'm not sure.  Symptoms have presisted x 4-5 days)  Ok to proceed with treatment per Dr. Alvy Bimler.  Sandi Mealy to assess pt in infusion area.

## 2017-02-25 ENCOUNTER — Other Ambulatory Visit: Payer: Self-pay | Admitting: *Deleted

## 2017-02-25 ENCOUNTER — Other Ambulatory Visit: Payer: Self-pay | Admitting: Medical

## 2017-02-25 ENCOUNTER — Telehealth: Payer: Self-pay | Admitting: *Deleted

## 2017-02-25 DIAGNOSIS — C9000 Multiple myeloma not having achieved remission: Secondary | ICD-10-CM

## 2017-02-25 LAB — KAPPA/LAMBDA LIGHT CHAINS
KAPPA, LAMDA LIGHT CHAIN RATIO: 0.41 (ref 0.26–1.65)
Kappa free light chain: 6.3 mg/L (ref 3.3–19.4)
LAMDA FREE LIGHT CHAINS: 15.4 mg/L (ref 5.7–26.3)

## 2017-02-25 MED ORDER — POMALIDOMIDE 3 MG PO CAPS: 3.0000 mg | ORAL_CAPSULE | Freq: Every day | ORAL | 0 refills | Status: DC

## 2017-02-25 MED ORDER — HYDROCODONE-HOMATROPINE 5-1.5 MG/5ML PO SYRP: 5.0000 mL | ORAL_SOLUTION | Freq: Four times a day (QID) | ORAL | 0 refills | Status: DC | PRN

## 2017-02-25 NOTE — Telephone Encounter (Signed)
Pt states insurance will not cover tussionex. Dr Alvy Bimler suggest hycodan. RN spoke with pharmacist- is not covered either, but out of pocket cost will be $ 27. Bradley Hunt is OK paying the $ 27

## 2017-02-25 NOTE — Progress Notes (Signed)
[facility] Progress Note   Bradley Hunt [id number] [birth year] 47 y.o.  Sherard Orr is managed by Dr. [name]  Actively treated with chemotherapy: yes  Current Therapy: Darzalex   Last Treated:   02/24/2017 (cycle 2, day 15)  Assessment: Plan:    Acute non-recurrent frontal sinusitis  Cough   Acute nonrecurrent frontal sinusitis: Patient was given a prescription for Ceftin 500 mg p.o. twice daily times 10 days.  Cough: Patient was given a prescription for Tussionex p.o. twice daily as needed for cough.  Please see After Visit Summary for patient specific instructions.  Future Appointments  Date Time Provider Raymond  03/03/2017  8:00 AM [facility] FLUSH NURSE 2 [facility] None  03/03/2017  9:00 AM [facility] I26 DNS [facility] None  03/10/2017  8:00 AM [facility] FLUSH NURSE 2 [facility] None  03/10/2017  9:00 AM [facility] PROCEDURE 2 [facility] None  03/24/2017  8:00 AM [facility] INJ NURSE [facility] None  03/24/2017  8:30 AM Gorsuch, Ni, MD [facility] None  03/24/2017  9:30 AM [facility] E16 [facility] None    No orders of the defined types were placed in this encounter.      Subjective:   Patient ID:  Bradley Hunt is a 47 y.o. (DOB 1970/06/23) male.  Chief Complaint: No chief complaint on file.   HPI Bradley Hunt is a 47 year old male with a diagnosis of multiple myeloma which is not in remission.  He presents today for cycle 2, day 15 of Darzalex.  He is seen in the infusion room for a productive cough with yellowish sputum, congestion, postnasal drainage, headaches, frontal sinus pressure, subjective fevers, chills, and sweats.  He reports increased facial pressure with bending forward.  He denies upper tooth pain.  Medications: I have reviewed the patient's current medications.  Allergies: No Known Allergies  Past Medical History:  Diagnosis Date  . Bone metastases ([place])    T spine and L spine  . History of  chemotherapy   . History of radiation therapy   . Multiple myeloma not having achieved remission ([place]) 06/25/15  . Numbness    lower extermities bilat     Past Surgical History:  Procedure Laterality Date  . ANTERIOR CRUCIATE LIGAMENT REPAIR    . IR FLUORO GUIDE PORT INSERTION RIGHT  05/16/2016  . IR US GUIDE VASC ACCESS RIGHT  05/16/2016  . LAMINECTOMY N/A 06/25/2015   Procedure: Thoracic six LAMINECTOMY RESECTION FOR TUMOR;  Surgeon: Consuella Lose, MD;  Location: [place] [facility];  Service: Neurosurgery;  Laterality: N/A;    Family History  Problem Relation Age of Onset  . Cancer Neg Hx     Social History   Socioeconomic History  . Marital status: Single    Spouse name: Not on file  . Number of children: 1  . Years of education: Not on file  . Highest education level: Not on file  Social Needs  . Financial resource strain: Not on file  . Food insecurity - worry: Not on file  . Food insecurity - inability: Not on file  . Transportation needs - medical: Not on file  . Transportation needs - non-medical: Not on file  Occupational History  . Not on file  Tobacco Use  . Smoking status: Former Smoker    Packs/day: 1.00    Years: 10.00    Pack years: 10.00    Types: Cigarettes    Last attempt to quit: 01/28/1997    Years since quitting: 20.0  . Smokeless tobacco: Never  Used  Substance and Sexual Activity  . Alcohol use: No  . Drug use: No  . Sexual activity: Yes    Comment: friend Amadou next of kin. 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 Not on file  Social History Narrative  . Not on file    Past Medical History, Surgical history, Social history, and Family history were reviewed and updated as appropriate.   Please see review of systems for further details on the patient's review from today.   Review of Systems:  Review of Systems  Constitutional: Positive for chills, diaphoresis and fever.  HENT: Positive for congestion, postnasal drip,  rhinorrhea, sinus pressure and sinus pain. Negative for sore throat and trouble swallowing.   Respiratory: Positive for cough. Negative for chest tightness, shortness of breath and wheezing.   Cardiovascular: Negative for chest pain.    Objective:   Physical Exam:  There were no vitals taken for this visit.  Physical Exam  Constitutional: No distress.  HENT:  Head: Normocephalic and atraumatic.  Nose: Right sinus exhibits frontal sinus tenderness. Right sinus exhibits no maxillary sinus tenderness. Left sinus exhibits frontal sinus tenderness. Left sinus exhibits no maxillary sinus tenderness.  Mouth/Throat: No oropharyngeal exudate.  Cardiovascular: Normal rate, regular rhythm and normal heart sounds. Exam reveals no gallop and no friction rub.  No murmur heard. Pulmonary/Chest: Effort normal and breath sounds normal. No respiratory distress. He has no wheezes. He has no rales.  Neurological: He is alert.  Skin: Skin is warm and dry. No rash noted. He is not diaphoretic. No erythema.    Lab Review:     Component Value Date/Time   NA 137 02/24/2017 0945   NA 139 01/27/2017 0809   K 4.0 02/24/2017 0945   K 4.1 01/27/2017 0809   CL 107 02/24/2017 0945   CO2 22 02/24/2017 0945   CO2 23 01/27/2017 0809   GLUCOSE 125 02/24/2017 0945   GLUCOSE 122 01/27/2017 0809   BUN 19 02/24/2017 0945   BUN 14.3 01/27/2017 0809   CREATININE 1.03 02/24/2017 0945   CREATININE 0.9 01/27/2017 0809   CALCIUM 8.9 02/24/2017 0945   CALCIUM 9.1 01/27/2017 0809   PROT 6.3 (L) 02/24/2017 0945   PROT 6.2 (L) 01/27/2017 0809   ALBUMIN 3.3 (L) 02/24/2017 0945   ALBUMIN 3.7 01/27/2017 0809   AST 14 02/24/2017 0945   AST 10 01/27/2017 0809   ALT 17 02/24/2017 0945   ALT 18 01/27/2017 0809   ALKPHOS 52 02/24/2017 0945   ALKPHOS 42 01/27/2017 0809   BILITOT 0.6 02/24/2017 0945   BILITOT 0.72 01/27/2017 0809   GFRNONAA >60 02/24/2017 0945   GFRAA >60 02/24/2017 0945       Component Value  Date/Time   WBC 3.1 (L) 02/24/2017 0945   RBC 4.01 (L) 02/24/2017 0945   HGB 12.7 (L) 02/24/2017 0945   HGB 12.3 (L) 01/27/2017 0809   HCT 36.6 (L) 02/24/2017 0945   HCT 35.7 (L) 01/27/2017 0809   PLT 142 02/24/2017 0945   PLT 157 01/27/2017 0809   MCV 91.3 02/24/2017 0945   MCV 90.6 01/27/2017 0809   MCH 31.7 02/24/2017 0945   MCHC 34.7 02/24/2017 0945   RDW 13.0 02/24/2017 0945   RDW 12.8 01/27/2017 0809   LYMPHSABS 0.4 (L) 02/24/2017 0945   LYMPHSABS 0.7 (L) 01/27/2017 0809   MONOABS 0.3 02/24/2017 0945   MONOABS 1.0 (H) 01/27/2017 0809   EOSABS 0.5 02/24/2017 0945   EOSABS 0.0  01/27/2017 0809   BASOSABS 0.0 02/24/2017 0945   BASOSABS 0.0 01/27/2017 0809   -------------------------------  Imaging from last 24 hours (if applicable):  Radiology interpretation: No results found.

## 2017-03-03 ENCOUNTER — Inpatient Hospital Stay: Payer: Medicaid Other

## 2017-03-03 ENCOUNTER — Inpatient Hospital Stay: Payer: Medicaid Other | Attending: Hematology and Oncology

## 2017-03-03 VITALS — BP 127/78 | HR 69 | Temp 98.0°F | Resp 17

## 2017-03-03 DIAGNOSIS — C9000 Multiple myeloma not having achieved remission: Secondary | ICD-10-CM | POA: Insufficient documentation

## 2017-03-03 DIAGNOSIS — Z5112 Encounter for antineoplastic immunotherapy: Secondary | ICD-10-CM | POA: Diagnosis not present

## 2017-03-03 LAB — CBC WITH DIFFERENTIAL/PLATELET
BASOS ABS: 0 10*3/uL (ref 0.0–0.1)
Basophils Relative: 0 %
EOS ABS: 0 10*3/uL (ref 0.0–0.5)
EOS PCT: 1 %
HCT: 33.8 % — ABNORMAL LOW (ref 38.4–49.9)
Hemoglobin: 11.7 g/dL — ABNORMAL LOW (ref 13.0–17.1)
LYMPHS PCT: 27 %
Lymphs Abs: 1.1 10*3/uL (ref 0.9–3.3)
MCH: 31.5 pg (ref 27.2–33.4)
MCHC: 34.6 g/dL (ref 32.0–36.0)
MCV: 91.1 fL (ref 79.3–98.0)
Monocytes Absolute: 0.8 10*3/uL (ref 0.1–0.9)
Monocytes Relative: 18 %
Neutro Abs: 2.3 10*3/uL (ref 1.5–6.5)
Neutrophils Relative %: 54 %
PLATELETS: 199 10*3/uL (ref 140–400)
RBC: 3.71 MIL/uL — ABNORMAL LOW (ref 4.20–5.82)
RDW: 12.9 % (ref 11.0–14.6)
WBC: 4.2 10*3/uL (ref 4.0–10.3)

## 2017-03-03 LAB — COMPREHENSIVE METABOLIC PANEL
ALT: 17 U/L (ref 0–55)
AST: 12 U/L (ref 5–34)
Albumin: 3.3 g/dL — ABNORMAL LOW (ref 3.5–5.0)
Alkaline Phosphatase: 52 U/L (ref 40–150)
Anion gap: 7 (ref 3–11)
BILIRUBIN TOTAL: 0.4 mg/dL (ref 0.2–1.2)
BUN: 17 mg/dL (ref 7–26)
CO2: 23 mmol/L (ref 22–29)
Calcium: 9.5 mg/dL (ref 8.4–10.4)
Chloride: 108 mmol/L (ref 98–109)
Creatinine, Ser: 0.86 mg/dL (ref 0.70–1.30)
Glucose, Bld: 117 mg/dL (ref 70–140)
POTASSIUM: 3.7 mmol/L (ref 3.5–5.1)
Sodium: 138 mmol/L (ref 136–145)
TOTAL PROTEIN: 6.1 g/dL — AB (ref 6.4–8.3)

## 2017-03-03 MED ORDER — DIPHENHYDRAMINE HCL 25 MG PO CAPS
50.0000 mg | ORAL_CAPSULE | Freq: Once | ORAL | Status: AC
  Administered 2017-03-03: 50 mg via ORAL

## 2017-03-03 MED ORDER — DIPHENHYDRAMINE HCL 25 MG PO CAPS
ORAL_CAPSULE | ORAL | Status: AC
  Filled 2017-03-03: qty 2

## 2017-03-03 MED ORDER — SODIUM CHLORIDE 0.9 % IV SOLN
Freq: Once | INTRAVENOUS | Status: AC
  Administered 2017-03-03: 09:00:00 via INTRAVENOUS

## 2017-03-03 MED ORDER — SODIUM CHLORIDE 0.9 % IV SOLN
20.0000 mg | Freq: Once | INTRAVENOUS | Status: AC
  Administered 2017-03-03: 20 mg via INTRAVENOUS
  Filled 2017-03-03: qty 2

## 2017-03-03 MED ORDER — SODIUM CHLORIDE 0.9% FLUSH
10.0000 mL | INTRAVENOUS | Status: DC | PRN
  Administered 2017-03-03: 10 mL
  Filled 2017-03-03: qty 10

## 2017-03-03 MED ORDER — ACETAMINOPHEN 325 MG PO TABS
650.0000 mg | ORAL_TABLET | Freq: Once | ORAL | Status: AC
  Administered 2017-03-03: 650 mg via ORAL

## 2017-03-03 MED ORDER — PROCHLORPERAZINE MALEATE 10 MG PO TABS
ORAL_TABLET | ORAL | Status: AC
  Filled 2017-03-03: qty 1

## 2017-03-03 MED ORDER — ANTICOAGULANT SODIUM CITRATE 4% (200MG/5ML) IV SOLN
5.0000 mL | Freq: Once | Status: AC
  Administered 2017-03-03: 5 mL via INTRAVENOUS
  Filled 2017-03-03: qty 5

## 2017-03-03 MED ORDER — ACETAMINOPHEN 325 MG PO TABS
ORAL_TABLET | ORAL | Status: AC
  Filled 2017-03-03: qty 2

## 2017-03-03 MED ORDER — SODIUM CHLORIDE 0.9 % IV SOLN
1700.0000 mg | Freq: Once | INTRAVENOUS | Status: AC
  Administered 2017-03-03: 1700 mg via INTRAVENOUS
  Filled 2017-03-03: qty 80

## 2017-03-03 MED ORDER — PROCHLORPERAZINE MALEATE 10 MG PO TABS
10.0000 mg | ORAL_TABLET | Freq: Once | ORAL | Status: AC
  Administered 2017-03-03: 10 mg via ORAL

## 2017-03-03 NOTE — Patient Instructions (Signed)
[facility] Discharge Instructions for Patients Receiving Chemotherapy  Today you received the following chemotherapy agents: Darzalex  To help prevent nausea and vomiting after your treatment, we encourage you to take your nausea medication as directed.    If you develop nausea and vomiting that is not controlled by your nausea medication, call the clinic.   BELOW ARE SYMPTOMS THAT SHOULD BE REPORTED IMMEDIATELY:  *FEVER GREATER THAN 100.5 F  *CHILLS WITH OR WITHOUT FEVER  NAUSEA AND VOMITING THAT IS NOT CONTROLLED WITH YOUR NAUSEA MEDICATION  *UNUSUAL SHORTNESS OF BREATH  *UNUSUAL BRUISING OR BLEEDING  TENDERNESS IN MOUTH AND THROAT WITH OR WITHOUT PRESENCE OF ULCERS  *URINARY PROBLEMS  *BOWEL PROBLEMS  UNUSUAL RASH Items with * indicate a potential emergency and should be followed up as soon as possible.  Feel free to call the clinic should you have any questions or concerns. The clinic phone number is [phone].  Please show the CHEMO ALERT CARD at check-in to the Emergency Department and triage nurse.   

## 2017-03-06 ENCOUNTER — Other Ambulatory Visit: Payer: Self-pay | Admitting: Hematology and Oncology

## 2017-03-10 ENCOUNTER — Telehealth: Payer: Self-pay | Admitting: *Deleted

## 2017-03-10 ENCOUNTER — Other Ambulatory Visit: Payer: Self-pay | Admitting: *Deleted

## 2017-03-10 ENCOUNTER — Inpatient Hospital Stay: Payer: Medicaid Other

## 2017-03-10 VITALS — BP 115/64 | HR 76 | Temp 98.0°F | Resp 16

## 2017-03-10 DIAGNOSIS — Z5112 Encounter for antineoplastic immunotherapy: Secondary | ICD-10-CM | POA: Diagnosis not present

## 2017-03-10 DIAGNOSIS — C9000 Multiple myeloma not having achieved remission: Secondary | ICD-10-CM

## 2017-03-10 LAB — CBC WITH DIFFERENTIAL/PLATELET
BASOS ABS: 0 10*3/uL (ref 0.0–0.1)
BASOS PCT: 0 %
EOS PCT: 1 %
Eosinophils Absolute: 0.1 10*3/uL (ref 0.0–0.5)
HEMATOCRIT: 37 % — AB (ref 38.4–49.9)
Hemoglobin: 12.6 g/dL — ABNORMAL LOW (ref 13.0–17.1)
LYMPHS PCT: 18 %
Lymphs Abs: 1.1 10*3/uL (ref 0.9–3.3)
MCH: 31.6 pg (ref 27.2–33.4)
MCHC: 33.9 g/dL (ref 32.0–36.0)
MCV: 93.2 fL (ref 79.3–98.0)
Monocytes Absolute: 1.4 10*3/uL — ABNORMAL HIGH (ref 0.1–0.9)
Monocytes Relative: 23 %
NEUTROS ABS: 3.5 10*3/uL (ref 1.5–6.5)
Neutrophils Relative %: 58 %
PLATELETS: 251 10*3/uL (ref 140–400)
RBC: 3.97 MIL/uL — AB (ref 4.20–5.82)
RDW: 14 % (ref 11.0–14.6)
WBC: 6 10*3/uL (ref 4.0–10.3)

## 2017-03-10 LAB — COMPREHENSIVE METABOLIC PANEL
ALT: 16 U/L (ref 0–55)
ANION GAP: 8 (ref 3–11)
AST: 10 U/L (ref 5–34)
Albumin: 3.6 g/dL (ref 3.5–5.0)
Alkaline Phosphatase: 48 U/L (ref 40–150)
BILIRUBIN TOTAL: 0.5 mg/dL (ref 0.2–1.2)
BUN: 23 mg/dL (ref 7–26)
CALCIUM: 9.4 mg/dL (ref 8.4–10.4)
CO2: 23 mmol/L (ref 22–29)
Chloride: 108 mmol/L (ref 98–109)
Creatinine, Ser: 1.12 mg/dL (ref 0.70–1.30)
Glucose, Bld: 100 mg/dL (ref 70–140)
POTASSIUM: 4.1 mmol/L (ref 3.5–5.1)
Sodium: 139 mmol/L (ref 136–145)
TOTAL PROTEIN: 6.3 g/dL — AB (ref 6.4–8.3)

## 2017-03-10 MED ORDER — PROCHLORPERAZINE MALEATE 10 MG PO TABS
ORAL_TABLET | ORAL | Status: AC
  Filled 2017-03-10: qty 1

## 2017-03-10 MED ORDER — SODIUM CHLORIDE 0.9 % IV SOLN
16.2000 mg/kg | Freq: Once | INTRAVENOUS | Status: AC
  Administered 2017-03-10: 1700 mg via INTRAVENOUS
  Filled 2017-03-10: qty 80

## 2017-03-10 MED ORDER — SODIUM CHLORIDE 0.9% FLUSH
10.0000 mL | Freq: Once | INTRAVENOUS | Status: AC
  Administered 2017-03-10: 10 mL
  Filled 2017-03-10: qty 10

## 2017-03-10 MED ORDER — ACETAMINOPHEN 325 MG PO TABS
650.0000 mg | ORAL_TABLET | Freq: Once | ORAL | Status: AC
  Administered 2017-03-10: 650 mg via ORAL

## 2017-03-10 MED ORDER — SODIUM CHLORIDE 0.9 % IV SOLN
20.0000 mg | Freq: Once | INTRAVENOUS | Status: AC
  Administered 2017-03-10: 20 mg via INTRAVENOUS
  Filled 2017-03-10: qty 2

## 2017-03-10 MED ORDER — ACETAMINOPHEN 325 MG PO TABS
ORAL_TABLET | ORAL | Status: AC
  Filled 2017-03-10: qty 2

## 2017-03-10 MED ORDER — DIPHENHYDRAMINE HCL 25 MG PO CAPS
ORAL_CAPSULE | ORAL | Status: AC
  Filled 2017-03-10: qty 2

## 2017-03-10 MED ORDER — SODIUM CHLORIDE 0.9 % IV SOLN
Freq: Once | INTRAVENOUS | Status: AC
  Administered 2017-03-10: 10:00:00 via INTRAVENOUS

## 2017-03-10 MED ORDER — PROCHLORPERAZINE MALEATE 10 MG PO TABS
10.0000 mg | ORAL_TABLET | Freq: Once | ORAL | Status: AC
  Administered 2017-03-10: 10 mg via ORAL

## 2017-03-10 MED ORDER — DEXAMETHASONE 4 MG PO TABS: 20.0000 mg | ORAL_TABLET | ORAL | 0 refills | Status: DC

## 2017-03-10 MED ORDER — DIPHENHYDRAMINE HCL 25 MG PO CAPS
50.0000 mg | ORAL_CAPSULE | Freq: Once | ORAL | Status: AC
  Administered 2017-03-10: 50 mg via ORAL

## 2017-03-10 MED ORDER — ANTICOAGULANT SODIUM CITRATE 4% (200MG/5ML) IV SOLN
5.0000 mL | Freq: Once | Status: AC
  Administered 2017-03-10: 5 mL via INTRAVENOUS
  Filled 2017-03-10: qty 5

## 2017-03-10 MED ORDER — SODIUM CHLORIDE 0.9% FLUSH
10.0000 mL | INTRAVENOUS | Status: DC | PRN
  Administered 2017-03-10: 10 mL
  Filled 2017-03-10: qty 10

## 2017-03-10 MED ORDER — OXYCODONE HCL 10 MG PO TABS: 10.0000 mg | ORAL_TABLET | Freq: Four times a day (QID) | ORAL | 0 refills | Status: DC | PRN

## 2017-03-10 NOTE — Telephone Encounter (Signed)
Pt here for chemo.  Has questions about how long  should pt continue with Dexamethasone; pt is also out of Decadron med.   Pt also would like refill of Oxycodone - pt uses  [facility] on [address]. Message routed to Dr. Alvy Bimler.

## 2017-03-10 NOTE — Telephone Encounter (Signed)
Hi Tammi,  Can you help print the refill of oxycodone? No change on dex. Please print that out too

## 2017-03-10 NOTE — Patient Instructions (Signed)
[facility] Discharge Instructions for Patients Receiving Chemotherapy  Today you received the following chemotherapy agents:  Darzalex  To help prevent nausea and vomiting after your treatment, we encourage you to take your nausea medication as prescribed.   If you develop nausea and vomiting that is not controlled by your nausea medication, call the clinic.   BELOW ARE SYMPTOMS THAT SHOULD BE REPORTED IMMEDIATELY:  *FEVER GREATER THAN 100.5 F  *CHILLS WITH OR WITHOUT FEVER  NAUSEA AND VOMITING THAT IS NOT CONTROLLED WITH YOUR NAUSEA MEDICATION  *UNUSUAL SHORTNESS OF BREATH  *UNUSUAL BRUISING OR BLEEDING  TENDERNESS IN MOUTH AND THROAT WITH OR WITHOUT PRESENCE OF ULCERS  *URINARY PROBLEMS  *BOWEL PROBLEMS  UNUSUAL RASH Items with * indicate a potential emergency and should be followed up as soon as possible.  Feel free to call the clinic should you have any questions or concerns. The clinic phone number is [phone].  Please show the CHEMO ALERT CARD at check-in to the Emergency Department and triage nurse.   

## 2017-03-24 ENCOUNTER — Other Ambulatory Visit: Payer: Self-pay | Admitting: *Deleted

## 2017-03-24 ENCOUNTER — Encounter (HOSPITAL_COMMUNITY): Payer: Self-pay | Admitting: Interventional Radiology

## 2017-03-24 ENCOUNTER — Inpatient Hospital Stay: Payer: Medicaid Other

## 2017-03-24 ENCOUNTER — Inpatient Hospital Stay (HOSPITAL_BASED_OUTPATIENT_CLINIC_OR_DEPARTMENT_OTHER): Payer: Medicaid Other | Admitting: Hematology and Oncology

## 2017-03-24 VITALS — BP 140/82 | HR 82 | Temp 98.5°F | Resp 18

## 2017-03-24 DIAGNOSIS — C9000 Multiple myeloma not having achieved remission: Secondary | ICD-10-CM

## 2017-03-24 DIAGNOSIS — G893 Neoplasm related pain (acute) (chronic): Secondary | ICD-10-CM

## 2017-03-24 DIAGNOSIS — Z5112 Encounter for antineoplastic immunotherapy: Secondary | ICD-10-CM | POA: Diagnosis not present

## 2017-03-24 DIAGNOSIS — D630 Anemia in neoplastic disease: Secondary | ICD-10-CM | POA: Diagnosis not present

## 2017-03-24 LAB — COMPREHENSIVE METABOLIC PANEL
ALT: 21 U/L (ref 0–55)
ANION GAP: 9 (ref 3–11)
AST: 12 U/L (ref 5–34)
Albumin: 3.8 g/dL (ref 3.5–5.0)
Alkaline Phosphatase: 50 U/L (ref 40–150)
BUN: 18 mg/dL (ref 7–26)
CHLORIDE: 107 mmol/L (ref 98–109)
CO2: 23 mmol/L (ref 22–29)
Calcium: 9.6 mg/dL (ref 8.4–10.4)
Creatinine, Ser: 0.97 mg/dL (ref 0.70–1.30)
Glucose, Bld: 116 mg/dL (ref 70–140)
POTASSIUM: 3.7 mmol/L (ref 3.5–5.1)
SODIUM: 139 mmol/L (ref 136–145)
Total Bilirubin: 0.6 mg/dL (ref 0.2–1.2)
Total Protein: 6.4 g/dL (ref 6.4–8.3)

## 2017-03-24 LAB — CBC WITH DIFFERENTIAL/PLATELET
Basophils Absolute: 0 10*3/uL (ref 0.0–0.1)
Basophils Relative: 1 %
EOS ABS: 0.1 10*3/uL (ref 0.0–0.5)
EOS PCT: 1 %
HCT: 36 % — ABNORMAL LOW (ref 38.4–49.9)
Hemoglobin: 12.3 g/dL — ABNORMAL LOW (ref 13.0–17.1)
LYMPHS ABS: 1 10*3/uL (ref 0.9–3.3)
Lymphocytes Relative: 20 %
MCH: 32 pg (ref 27.2–33.4)
MCHC: 34.1 g/dL (ref 32.0–36.0)
MCV: 93.7 fL (ref 79.3–98.0)
MONO ABS: 1.1 10*3/uL — AB (ref 0.1–0.9)
MONOS PCT: 22 %
Neutro Abs: 2.8 10*3/uL (ref 1.5–6.5)
Neutrophils Relative %: 56 %
PLATELETS: 169 10*3/uL (ref 140–400)
RBC: 3.84 MIL/uL — AB (ref 4.20–5.82)
RDW: 14.5 % (ref 11.0–14.6)
WBC: 4.9 10*3/uL (ref 4.0–10.3)

## 2017-03-24 MED ORDER — DIPHENHYDRAMINE HCL 25 MG PO CAPS
ORAL_CAPSULE | ORAL | Status: AC
  Filled 2017-03-24: qty 2

## 2017-03-24 MED ORDER — DIPHENHYDRAMINE HCL 25 MG PO CAPS
50.0000 mg | ORAL_CAPSULE | Freq: Once | ORAL | Status: AC
  Administered 2017-03-24: 50 mg via ORAL

## 2017-03-24 MED ORDER — POMALIDOMIDE 2 MG PO CAPS: 2.0000 mg | ORAL_CAPSULE | Freq: Every day | ORAL | 11 refills | Status: DC

## 2017-03-24 MED ORDER — ACETAMINOPHEN 325 MG PO TABS
650.0000 mg | ORAL_TABLET | Freq: Once | ORAL | Status: AC
  Administered 2017-03-24: 650 mg via ORAL

## 2017-03-24 MED ORDER — SODIUM CHLORIDE 0.9% FLUSH
10.0000 mL | INTRAVENOUS | Status: DC | PRN
  Administered 2017-03-24: 10 mL
  Filled 2017-03-24: qty 10

## 2017-03-24 MED ORDER — SODIUM CHLORIDE 0.9 % IV SOLN
20.0000 mg | Freq: Once | INTRAVENOUS | Status: AC
  Administered 2017-03-24: 20 mg via INTRAVENOUS
  Filled 2017-03-24: qty 2

## 2017-03-24 MED ORDER — PROCHLORPERAZINE MALEATE 10 MG PO TABS
ORAL_TABLET | ORAL | Status: AC
  Filled 2017-03-24: qty 1

## 2017-03-24 MED ORDER — ACETAMINOPHEN 325 MG PO TABS
ORAL_TABLET | ORAL | Status: AC
  Filled 2017-03-24: qty 2

## 2017-03-24 MED ORDER — SODIUM CHLORIDE 0.9 % IV SOLN
Freq: Once | INTRAVENOUS | Status: AC
  Administered 2017-03-24: 09:00:00 via INTRAVENOUS

## 2017-03-24 MED ORDER — SODIUM CHLORIDE 0.9% FLUSH
10.0000 mL | Freq: Once | INTRAVENOUS | Status: AC
  Administered 2017-03-24: 10 mL
  Filled 2017-03-24: qty 10

## 2017-03-24 MED ORDER — POMALIDOMIDE 2 MG PO CAPS
2.0000 mg | ORAL_CAPSULE | Freq: Every day | ORAL | 11 refills | Status: DC
Start: 2017-03-24 — End: 2017-03-24

## 2017-03-24 MED ORDER — PROCHLORPERAZINE MALEATE 10 MG PO TABS
10.0000 mg | ORAL_TABLET | Freq: Once | ORAL | Status: AC
  Administered 2017-03-24: 10 mg via ORAL

## 2017-03-24 MED ORDER — SODIUM CHLORIDE 0.9 % IV SOLN
1700.0000 mg | Freq: Once | INTRAVENOUS | Status: AC
  Administered 2017-03-24: 1700 mg via INTRAVENOUS
  Filled 2017-03-24: qty 80

## 2017-03-24 MED ORDER — HEPARIN SOD (PORK) LOCK FLUSH 100 UNIT/ML IV SOLN
500.0000 [IU] | Freq: Once | INTRAVENOUS | Status: AC | PRN
  Administered 2017-03-24: 500 [IU]
  Filled 2017-03-24: qty 5

## 2017-03-24 MED ORDER — OXYCODONE HCL 10 MG PO TABS: 10.0000 mg | ORAL_TABLET | Freq: Four times a day (QID) | ORAL | 0 refills | Status: DC | PRN

## 2017-03-24 MED FILL — oxyCODONE HCL 10 MG TABS: 10 | 23 days supply | Qty: 90 | Fill #0

## 2017-03-24 NOTE — Patient Instructions (Signed)
[facility] Discharge Instructions for Patients Receiving Chemotherapy  Today you received the following chemotherapy agent: Darzalex  To help prevent nausea and vomiting after your treatment, we encourage you to take your nausea medication as directed.    If you develop nausea and vomiting that is not controlled by your nausea medication, call the clinic.   BELOW ARE SYMPTOMS THAT SHOULD BE REPORTED IMMEDIATELY:  *FEVER GREATER THAN 100.5 F  *CHILLS WITH OR WITHOUT FEVER  NAUSEA AND VOMITING THAT IS NOT CONTROLLED WITH YOUR NAUSEA MEDICATION  *UNUSUAL SHORTNESS OF BREATH  *UNUSUAL BRUISING OR BLEEDING  TENDERNESS IN MOUTH AND THROAT WITH OR WITHOUT PRESENCE OF ULCERS  *URINARY PROBLEMS  *BOWEL PROBLEMS  UNUSUAL RASH Items with * indicate a potential emergency and should be followed up as soon as possible.  Feel free to call the clinic should you have any questions or concerns. The clinic phone number is [phone].  Please show the CHEMO ALERT CARD at check-in to the Emergency Department and triage nurse.  

## 2017-03-24 NOTE — Assessment & Plan Note (Signed)
This is likely due to recent treatment. The patient denies recent history of bleeding such as epistaxis, hematuria or hematochezia. He is asymptomatic from the anemia. I will observe for now.  He does not require transfusion now. I will continue the chemotherapy at current dose without dosage adjustment.  If the anemia gets progressive worse in the future, I might have to delay his treatment or adjust the chemotherapy dose.  

## 2017-03-24 NOTE — Progress Notes (Signed)
Per pharmacy pt cannot receive zometa today until he has dental clearance.  Pt is going to dentist later this month.  MD Alvy Bimler desk RN [place] aware.

## 2017-03-24 NOTE — Assessment & Plan Note (Signed)
He tolerated treatment well with positive response to treatment. Last myeloma panel showed complete response to treatment. I have reviewed recommendation for [facility] Currently, his daratumumab is every other week for a few months and then subsequently switched to monthly I plan on completing dexamethasone taper next month I will reduce the dose of Pomalyst to 2 mg daily for 21 days, 7 days off The patient wants to return home to [place] region for a family reunion and we will skip the dose at the end of March  He will continue aspirin for DVT prophylaxis.  He will continue acyclovir for antimicrobial prophylaxis He is reminded to take calcium and vitamin D He has recently visited a dentist.  We will call the dentist office for dental clearance before we proceed with Zometa I will see him in April for further toxicity review

## 2017-03-24 NOTE — Progress Notes (Signed)
[place] OFFICE PROGRESS NOTE  Patient Care Team: [name], MD as PCP - General (Internal Medicine)  ASSESSMENT & PLAN:  Multiple myeloma not having achieved remission ([place]) He tolerated treatment well with positive response to treatment. Last myeloma panel showed complete response to treatment. I have reviewed recommendation for [facility] Currently, his daratumumab is every other week for a few months and then subsequently switched to monthly I plan on completing dexamethasone taper next month I will reduce the dose of Pomalyst to 2 mg daily for 21 days, 7 days off The patient wants to return home to [place] region for a family reunion and we will skip the dose at the end of March  He will continue aspirin for DVT prophylaxis.  He will continue acyclovir for antimicrobial prophylaxis He is reminded to take calcium and vitamin D He has recently visited a dentist.  We will call the dentist office for dental clearance before we proceed with Zometa I will see him in April for further toxicity review  Anemia in neoplastic disease This is likely due to recent treatment. The patient denies recent history of bleeding such as epistaxis, hematuria or hematochezia. He is asymptomatic from the anemia. I will observe for now.  He does not require transfusion now. I will continue the chemotherapy at current dose without dosage adjustment.  If the anemia gets progressive worse in the future, I might have to delay his treatment or adjust the chemotherapy dose.   Cancer associated pain He had persistent cancer back pain He felt better while on oxycodone pain medicine We discussed chronic pain management and narcotic refill policy He is reminded about the risk of sedation, nausea and constipation   No orders of the defined types were placed in this encounter.   INTERVAL HISTORY: Please see below for problem oriented charting. He returns for  further follow-up He has not completed dental evaluation and clearance yet He continues to have intermittent bone pain, stable He denies recent infection, fever or chills The current prescription pain medicine is controlling his pain well He denies nausea or constipation. Denies worsening peripheral neuropathy  SUMMARY OF ONCOLOGIC HISTORY:   Multiple myeloma not having achieved remission ([place])   06/23/2015 - 06/28/2015 Hospital Admission    The patient was admitted to the hospital due to gait ataxia and back pain. He was subsequently found to have cord compression underwent surgery and was discharged home      06/24/2015 Imaging    Abnormal appearance of the T6 vertebral body, highly suspicious for possible osseous metastasis. Associated pathologic fracture withup to 30% height loss. There is associated abnormal soft tissue density within the ventral epidural space,      06/24/2015 Imaging    MRI lumbar: Focal osseous lesion with abnormal enhancement involving the right pedicle of L3, suspicious for possible osseous metastasisgiven the findings in the thoracic spine. Question additional focal lesion within the right iliac wing as above.        06/25/2015 Pathology Results    Accession: [id number] bone biopsy come from plasma cell neoplasm.      06/25/2015 Surgery    He had T6 laminectomy, bilateral transpedicular approach for resection of tumor, decompression of thecal sac and microdissection      07/20/2015 Bone Marrow Biopsy    BM biopsy showed 50% involvement; Cytogenetics 46XY, positive for 13q-      07/31/2015 - 11/03/2015 Chemotherapy    He received Velcade, Revlimid and  Dex. Zometa is not given due to inability to get dental clearance      12/14/2015 - 04/24/2016 Chemotherapy    He is started on maintenance treatment with Revlimid only      12/18/2015 Imaging    MRI thoracic and lumbar spine showed numerous enhancing foci throughout the thoracic and lumbar spine with several  new small foci in the lumbar spine in comparison with prior MRI compatible with metastatic disease. Stable loss of height of the T3, T4, and T6 vertebral bodies and new postsurgical changes related to T6 laminectomy. No significant epidural disease or evidence for cord compression. No abnormal enhancement of the spinal cord or cauda equina.      05/02/2016 Bone Marrow Biopsy    Outside bone marrow biopsy showed 20% myeloma involvement      05/16/2016 Procedure    Successful placement of a right internal jugular approach power injectable Port-A-Cath. The catheter is ready for immediate use.      05/21/2016 - 07/03/2016 Chemotherapy    He received Kyprolis, Cytoxan and dexamethasone       07/08/2016 Procedure    Status post CT-guided bone marrow biopsy, with tissue specimen sent to pathology for complete histopathologic analysis      07/08/2016 Bone Marrow Biopsy    Bone Marrow, Aspirate,Biopsy, and Clot BONE MARROW: - MILDLY HYPERCELLULAR MARROW (60%) WITH PLASMA CELL NEOPLASM - SEE COMMENT PERIPHERAL BLOOD: - NORMOCYTIC ANEMIA Diagnosis Note The marrow is hypercellular with lambda-restricted plasma cells consistent with persistence of the patient's previously diagnosed plasma cell neoplasm. The plasma cells comprise approximately 10-15% of the total marrow cellularity, are enlarged, and arranged in clusters.      08/14/2016 Miscellaneous    He received conditioning treatment with melphalan      08/15/2016 Bone Marrow Transplant    He received autologous stem cell transplant      08/24/2016 - 08/29/2016 Hospital Admission    His post-transplant course was complicated by E-Coli bacteremia      11/26/2016 PET scan    PET CT at [facility] 1. Technically limited study due to soft tissue uptake. 2. New hypermetabolic uptake at C7 spinous process and left proximal femur that is of questionable significance in absence of underlying CT correlate. Further assessment with whole body bone scan may be  considered. 2. Redemonstrated nonhypermetabolic multifocal lucent and sclerotic lesions throughout the spine which are similar to prior.       12/02/2016 Bone Marrow Biopsy    He had repeat bone marrow biopsy at [facility] An immunohistochemical stain for CD138 is performed on the bone marrow core biopsy demonstrates increased plasma cells with focal clustering (10-20% overall), which are monotypic for lambda light chain by in situ hybridization.      01/06/2017 -  Chemotherapy    He received weekly Dexamethasone, Pomalyst days 1-21 and Daratumumab       REVIEW OF SYSTEMS:   Constitutional: Denies fevers, chills or abnormal weight loss Eyes: Denies blurriness of vision Ears, nose, mouth, throat, and face: Denies mucositis or sore throat Respiratory: Denies cough, dyspnea or wheezes Cardiovascular: Denies palpitation, chest discomfort or lower extremity swelling Gastrointestinal:  Denies nausea, heartburn or change in bowel habits Skin: Denies abnormal skin rashes Lymphatics: Denies new lymphadenopathy or easy bruising Neurological:Denies numbness, tingling or new weaknesses Behavioral/Psych: Mood is stable, no new changes  All other systems were reviewed with the patient and are negative.  I have reviewed the past medical history, past surgical history, social history and family history  with the patient and they are unchanged from previous note.  ALLERGIES:  has No Known Allergies.  MEDICATIONS:  Current Outpatient Medications  Medication Sig Dispense Refill  . acyclovir (ZOVIRAX) 400 MG tablet Take 1 tablet (400 mg total) by mouth 2 (two) times daily. 60 tablet 11  . aspirin EC 81 MG tablet Take 81 mg by mouth daily.    . cholecalciferol (VITAMIN D) 1000 units tablet Take 1,000 Units by mouth daily.    Marland Kitchen dexamethasone (DECADRON) 4 MG tablet Take 5 tablets (20 mg total) by mouth once a week. 20 tablet 0  . lidocaine-prilocaine (EMLA) cream Apply to affected area once (Patient not  taking: Reported on 09/06/2016) 30 g 3  . Oxycodone HCl 10 MG TABS Take 1 tablet (10 mg total) by mouth every 6 (six) hours as needed. 90 tablet 0  . pantoprazole (PROTONIX) 40 MG tablet Take 40 mg by mouth daily.  0  . pomalidomide (POMALYST) 2 MG capsule Take 1 capsule (2 mg total) by mouth daily. Take with water for 21 days on,7 days off. Repeat every 28 days. 21 capsule 11   No current facility-administered medications for this visit.    Facility-Administered Medications Ordered in Other Visits  Medication Dose Route Frequency Provider Last Rate Last Dose  . daratumumab (DARZALEX) 1,700 mg in sodium chloride 0.9 % 415 mL chemo infusion  1,700 mg Intravenous Once Lc Joynt, MD      . heparin lock flush 100 unit/mL  500 Units Intracatheter Once PRN Alvy Bimler, Wanda Cellucci, MD      . sodium chloride flush (NS) 0.9 % injection 10 mL  10 mL Intracatheter PRN Alvy Bimler, Sreya Froio, MD        PHYSICAL EXAMINATION: ECOG PERFORMANCE STATUS: 1 - Symptomatic but completely ambulatory  Vitals:   03/24/17 0843  BP: (!) 148/86  Pulse: 83  Resp: 18  Temp: 98.3 F (36.8 C)  SpO2: 100%   Filed Weights   03/24/17 0843  Weight: 237 lb 14.4 oz (107.9 kg)    GENERAL:alert, no distress and comfortable SKIN: skin color, texture, turgor are normal, no rashes or significant lesions EYES: normal, Conjunctiva are pink and non-injected, sclera clear OROPHARYNX:no exudate, no erythema and lips, buccal mucosa, and tongue normal  NECK: supple, thyroid normal size, non-tender, without nodularity LYMPH:  no palpable lymphadenopathy in the cervical, axillary or inguinal LUNGS: clear to auscultation and percussion with normal breathing effort HEART: regular rate & rhythm and no murmurs and no lower extremity edema ABDOMEN:abdomen soft, non-tender and normal bowel sounds Musculoskeletal:no cyanosis of digits and no clubbing  NEURO: alert & oriented x 3 with fluent speech, no focal motor/sensory deficits  LABORATORY DATA:  I  have reviewed the data as listed    Component Value Date/Time   NA 139 03/24/2017 0817   NA 139 01/27/2017 0809   K 3.7 03/24/2017 0817   K 4.1 01/27/2017 0809   CL 107 03/24/2017 0817   CO2 23 03/24/2017 0817   CO2 23 01/27/2017 0809   GLUCOSE 116 03/24/2017 0817   GLUCOSE 122 01/27/2017 0809   BUN 18 03/24/2017 0817   BUN 14.3 01/27/2017 0809   CREATININE 0.97 03/24/2017 0817   CREATININE 0.9 01/27/2017 0809   CALCIUM 9.6 03/24/2017 0817   CALCIUM 9.1 01/27/2017 0809   PROT 6.4 03/24/2017 0817   PROT 6.2 (L) 01/27/2017 0809   ALBUMIN 3.8 03/24/2017 0817   ALBUMIN 3.7 01/27/2017 0809   AST 12 03/24/2017 0817   AST 10  01/27/2017 0809   ALT 21 03/24/2017 0817   ALT 18 01/27/2017 0809   ALKPHOS 50 03/24/2017 0817   ALKPHOS 42 01/27/2017 0809   BILITOT 0.6 03/24/2017 0817   BILITOT 0.72 01/27/2017 0809   GFRNONAA >60 03/24/2017 0817   GFRAA >60 03/24/2017 0817    No results found for: SPEP, UPEP  Lab Results  Component Value Date   WBC 4.9 03/24/2017   NEUTROABS 2.8 03/24/2017   HGB 12.3 (L) 03/24/2017   HCT 36.0 (L) 03/24/2017   MCV 93.7 03/24/2017   PLT 169 03/24/2017      Chemistry      Component Value Date/Time   NA 139 03/24/2017 0817   NA 139 01/27/2017 0809   K 3.7 03/24/2017 0817   K 4.1 01/27/2017 0809   CL 107 03/24/2017 0817   CO2 23 03/24/2017 0817   CO2 23 01/27/2017 0809   BUN 18 03/24/2017 0817   BUN 14.3 01/27/2017 0809   CREATININE 0.97 03/24/2017 0817   CREATININE 0.9 01/27/2017 0809      Component Value Date/Time   CALCIUM 9.6 03/24/2017 0817   CALCIUM 9.1 01/27/2017 0809   ALKPHOS 50 03/24/2017 0817   ALKPHOS 42 01/27/2017 0809   AST 12 03/24/2017 0817   AST 10 01/27/2017 0809   ALT 21 03/24/2017 0817   ALT 18 01/27/2017 0809   BILITOT 0.6 03/24/2017 0817   BILITOT 0.72 01/27/2017 0809      All questions were answered. The patient knows to call the clinic with any problems, questions or concerns. No barriers to learning was  detected.  I spent 15 minutes counseling the patient face to face. The total time spent in the appointment was 20 minutes and more than 50% was on counseling and review of test results  [name], MD 03/24/2017 11:12 AM

## 2017-03-24 NOTE — Assessment & Plan Note (Signed)
He had persistent cancer back pain He felt better while on oxycodone pain medicine We discussed chronic pain management and narcotic refill policy He is reminded about the risk of sedation, nausea and constipation

## 2017-03-25 LAB — KAPPA/LAMBDA LIGHT CHAINS
KAPPA FREE LGHT CHN: 5.4 mg/L (ref 3.3–19.4)
KAPPA, LAMDA LIGHT CHAIN RATIO: 0.49 (ref 0.26–1.65)
Lambda free light chains: 11 mg/L (ref 5.7–26.3)

## 2017-03-26 ENCOUNTER — Telehealth: Payer: Self-pay | Admitting: Pharmacist

## 2017-03-26 NOTE — Telephone Encounter (Signed)
Oral Oncology Pharmacist Encounter  Received call from [facility] with issues getting in touch with patient to schedule next delivery of Pomalyst prescription. I spoke with patient who will complete current cycle of Pomalyst tomorrow.  He will then take 1 week off from [facility] therapy.  Patient requests I have [facility] reach out to him on Monday, March 4 to schedule delivery of next cycle of Pomalyst.  I called [place] [phone]) and requested follow-up call on 03/31/2017.  Oral Oncology Clinic will continue to follow.  Johny Drilling, PharmD, BCPS, BCOP 03/26/2017 10:23 AM Oral Oncology Clinic [phone]

## 2017-03-31 ENCOUNTER — Other Ambulatory Visit: Payer: Self-pay | Admitting: Hematology and Oncology

## 2017-04-03 ENCOUNTER — Emergency Department (HOSPITAL_COMMUNITY): Payer: Medicaid Other

## 2017-04-03 ENCOUNTER — Emergency Department (HOSPITAL_COMMUNITY)
Admission: EM | Admit: 2017-04-03 | Discharge: 2017-04-03 | Disposition: A | Discharge status: Home / Self Care | Payer: Medicaid Other | Attending: Emergency Medicine | Admitting: Emergency Medicine

## 2017-04-03 ENCOUNTER — Encounter (HOSPITAL_COMMUNITY): Payer: Self-pay | Admitting: *Deleted

## 2017-04-03 ENCOUNTER — Other Ambulatory Visit: Payer: Self-pay

## 2017-04-03 DIAGNOSIS — Z79899 Other long term (current) drug therapy: Secondary | ICD-10-CM | POA: Insufficient documentation

## 2017-04-03 DIAGNOSIS — Z7982 Long term (current) use of aspirin: Secondary | ICD-10-CM | POA: Diagnosis not present

## 2017-04-03 DIAGNOSIS — Z87891 Personal history of nicotine dependence: Secondary | ICD-10-CM | POA: Diagnosis not present

## 2017-04-03 DIAGNOSIS — R05 Cough: Secondary | ICD-10-CM | POA: Diagnosis present

## 2017-04-03 DIAGNOSIS — C7951 Secondary malignant neoplasm of bone: Secondary | ICD-10-CM | POA: Insufficient documentation

## 2017-04-03 DIAGNOSIS — J208 Acute bronchitis due to other specified organisms: Secondary | ICD-10-CM | POA: Insufficient documentation

## 2017-04-03 DIAGNOSIS — C9000 Multiple myeloma not having achieved remission: Secondary | ICD-10-CM | POA: Diagnosis not present

## 2017-04-03 LAB — CBC WITH DIFFERENTIAL/PLATELET
Basophils Absolute: 0.1 10*3/uL (ref 0.0–0.1)
Basophils Relative: 1 %
EOS PCT: 6 %
Eosinophils Absolute: 0.3 10*3/uL (ref 0.0–0.7)
HCT: 36.3 % — ABNORMAL LOW (ref 39.0–52.0)
Hemoglobin: 12.4 g/dL — ABNORMAL LOW (ref 13.0–17.0)
LYMPHS ABS: 1.2 10*3/uL (ref 0.7–4.0)
Lymphocytes Relative: 21 %
MCH: 31.9 pg (ref 26.0–34.0)
MCHC: 34.2 g/dL (ref 30.0–36.0)
MCV: 93.3 fL (ref 78.0–100.0)
MONO ABS: 1.1 10*3/uL — AB (ref 0.1–1.0)
Monocytes Relative: 19 %
NEUTROS ABS: 2.9 10*3/uL (ref 1.7–7.7)
Neutrophils Relative %: 53 %
PLATELETS: 213 10*3/uL (ref 150–400)
RBC: 3.89 MIL/uL — ABNORMAL LOW (ref 4.22–5.81)
RDW: 13.7 % (ref 11.5–15.5)
WBC: 5.6 10*3/uL (ref 4.0–10.5)

## 2017-04-03 LAB — COMPREHENSIVE METABOLIC PANEL
ALT: 20 U/L (ref 17–63)
AST: 18 U/L (ref 15–41)
Albumin: 3.7 g/dL (ref 3.5–5.0)
Alkaline Phosphatase: 52 U/L (ref 38–126)
Anion gap: 6 (ref 5–15)
BUN: 19 mg/dL (ref 6–20)
CHLORIDE: 107 mmol/L (ref 101–111)
CO2: 27 mmol/L (ref 22–32)
CREATININE: 1.13 mg/dL (ref 0.61–1.24)
Calcium: 9.1 mg/dL (ref 8.9–10.3)
Glucose, Bld: 116 mg/dL — ABNORMAL HIGH (ref 65–99)
POTASSIUM: 3.9 mmol/L (ref 3.5–5.1)
Sodium: 140 mmol/L (ref 135–145)
Total Bilirubin: 0.3 mg/dL (ref 0.3–1.2)
Total Protein: 6.2 g/dL — ABNORMAL LOW (ref 6.5–8.1)

## 2017-04-03 LAB — I-STAT CG4 LACTIC ACID, ED: LACTIC ACID, VENOUS: 1.09 mmol/L (ref 0.5–1.9)

## 2017-04-03 LAB — INFLUENZA PANEL BY PCR (TYPE A & B)
INFLAPCR: NEGATIVE
Influenza B By PCR: NEGATIVE

## 2017-04-03 MED ORDER — METOCLOPRAMIDE HCL 5 MG/ML IJ SOLN
10.0000 mg | Freq: Once | INTRAMUSCULAR | Status: AC
  Administered 2017-04-03: 10 mg via INTRAVENOUS
  Filled 2017-04-03: qty 2

## 2017-04-03 MED ORDER — SODIUM CHLORIDE 0.9 % IV BOLUS (SEPSIS)
1000.0000 mL | Freq: Once | INTRAVENOUS | Status: AC
  Administered 2017-04-03: 1000 mL via INTRAVENOUS

## 2017-04-03 MED ORDER — HYDROCODONE-HOMATROPINE 5-1.5 MG/5ML PO SYRP: 5.0000 mL | ORAL_SOLUTION | Freq: Four times a day (QID) | ORAL | 0 refills | Status: DC | PRN

## 2017-04-03 MED ORDER — ANTICOAGULANT SODIUM CITRATE 4% (200MG/5ML) IV SOLN
5.0000 mL | Freq: Once | Status: DC
  Filled 2017-04-03: qty 250

## 2017-04-03 MED ORDER — AZITHROMYCIN 250 MG PO TABS: ORAL_TABLET | ORAL | 0 refills | Status: DC

## 2017-04-03 MED ORDER — MORPHINE SULFATE (PF) 4 MG/ML IV SOLN
4.0000 mg | Freq: Once | INTRAVENOUS | Status: AC
  Administered 2017-04-03: 4 mg via INTRAVENOUS
  Filled 2017-04-03: qty 1

## 2017-04-03 MED ORDER — DIPHENHYDRAMINE HCL 50 MG/ML IJ SOLN
25.0000 mg | Freq: Once | INTRAMUSCULAR | Status: AC
  Administered 2017-04-03: 25 mg via INTRAVENOUS
  Filled 2017-04-03: qty 1

## 2017-04-03 MED ORDER — HEPARIN SOD (PORK) LOCK FLUSH 100 UNIT/ML IV SOLN: 500.0000 [IU] | Freq: Once | INTRAVENOUS | Status: DC

## 2017-04-03 MED ORDER — HEPARIN SOD (PORK) LOCK FLUSH 100 UNIT/ML IV SOLN
500.0000 [IU] | Freq: Once | INTRAVENOUS | Status: AC
  Administered 2017-04-03: 500 [IU]
  Filled 2017-04-03: qty 5

## 2017-04-03 MED FILL — AZITHROMYCIN 250 MG TABS: 250 | 5 days supply | Qty: 6 | Fill #0

## 2017-04-03 MED FILL — HYDROCODONE-HOMATROPINE SOL: 5-1.5 | 3 days supply | Qty: 60 | Fill #0

## 2017-04-03 NOTE — ED Notes (Signed)
Pt sts it's ok to flush his port with heparin, he has to go

## 2017-04-03 NOTE — ED Triage Notes (Signed)
Pt c/o cough, runny nose x 3 days, as well as headache. Pt denies fever, he sts he is on chemo currently which makes him feel weak and gives him body aches and he hasn't noticed any worsening of those. Pt sts he had same cough about a month ago and was prescribed cough medicine and antibiotics for it which made it better, but now it has return. Pt also reports yellow mucus. He sts he may have visit a friend whose child was sick with flu like symptoms recently and he is worried that he might have flu.

## 2017-04-03 NOTE — Discharge Instructions (Signed)
Take zpack as prescribed.   Take hycodan for cough.   Stay hydrated.   See your oncologist for follow up   Avoid being around people who are sick   Return to ER if you have fever, trouble breathing, vomiting.

## 2017-04-03 NOTE — ED Provider Notes (Signed)
[place] DEPT Provider Note   CSN: [id number] Arrival date & time: 04/03/17  0847     History   Chief Complaint Chief Complaint  Patient presents with  . Cough    HPI Bradley Hunt is a 47 y.o. male history of multiple myeloma not in remission (last chemo a week ago, still on oral chemo as well), here presenting with cough, chills.  Patient has been coughing for the last several days.  He states about a month ago he was diagnosed with pneumonia and took some antibiotics but did not finish them.  He restarted taking some antibiotics but did not remember what they are.  He states that he has productive cough with yellow sputum.  Denies any fevers or chills.  He does have some headaches and body aches.  He does have chronic back pain from the myeloma but is well controlled on his baseline oxycodone.  Patient states that he recently visited a friend who has a sick child and he was worried that he may have the flu.  Denies actual fevers at home.  The history is provided by the patient.    Past Medical History:  Diagnosis Date  . Bone metastases ([place])    T spine and L spine  . History of chemotherapy   . History of radiation therapy   . Multiple myeloma not having achieved remission ([place]) 06/25/15  . Numbness    lower extermities bilat     Patient Active Problem List   Diagnosis Date Noted  . Pancytopenia, acquired ([place]) 07/17/2016  . Goals of care, counseling/discussion 05/10/2016  . 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 Constipation, chronic 11/03/2015  . Cancer associated pain 09/11/2015  . Multiple myeloma not having achieved remission ([place]) 07/18/2015  . 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 Leukocytosis 06/24/2015  . 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 Bony metastasis ([place]) 06/24/2015    Past Surgical History:    Procedure Laterality Date  . ANTERIOR CRUCIATE LIGAMENT REPAIR    . IR FLUORO GUIDE PORT INSERTION RIGHT  05/16/2016  . IR US GUIDE VASC ACCESS RIGHT  05/16/2016  . LAMINECTOMY N/A 06/25/2015   Procedure: Thoracic six LAMINECTOMY RESECTION FOR TUMOR;  Surgeon: Consuella Lose, MD;  Location: [facility];  Service: Neurosurgery;  Laterality: N/A;       Home Medications    Prior to Admission medications   Medication Sig Start Date End Date Taking? Authorizing Provider  acyclovir (ZOVIRAX) 400 MG tablet Take 1 tablet (400 mg total) by mouth 2 (two) times daily. 03/18/16  Yes [name], MD  aspirin EC 81 MG tablet Take 81 mg by mouth daily. 08/29/16  Yes [provider]  dexamethasone (DECADRON) 4 MG tablet Take 5 tablets (20 mg total) by mouth once a week. 03/10/17  Yes Gorsuch, Ni, MD  ibuprofen (ADVIL,MOTRIN) 800 MG tablet Take 800 mg by mouth every 8 (eight) hours as needed (pain).   Yes [provider]  lidocaine-prilocaine (EMLA) cream Apply to affected area once 05/10/16  Yes Gorsuch, Ni, MD  Oxycodone HCl 10 MG TABS Take 1 tablet (10 mg total) by mouth every 6 (six) hours as needed. 03/24/17  Yes Gorsuch, Ni, MD  pantoprazole (PROTONIX) 40 MG tablet Take 40 mg by mouth daily. 12/18/16  Yes [provider]  pomalidomide (POMALYST) 2 MG capsule Take 1 capsule (2 mg total)  by mouth daily. Take with water for 21 days on,7 days off. Repeat every 28 days. 03/24/17  Yes Gorsuch, Ni, MD  sulfamethoxazole-trimethoprim (BACTRIM DS,SEPTRA DS) 800-160 MG tablet Take 1 tablet by mouth. Take 1 tablet every Monday, Wednesday and Friday.   Yes [provider]    Family History Family History  Problem Relation Age of Onset  . Cancer Neg Hx     Social History Social History   Tobacco Use  . Smoking status: Former Smoker    Packs/day: 1.00    Years: 10.00    Pack years: 10.00    Types: Cigarettes    Last attempt to quit: 01/28/1997    Years since quitting:  20.1  . Smokeless tobacco: Never Used  Substance Use Topics  . Alcohol use: No  . Drug use: No     Allergies   Patient has no known allergies.   Review of Systems Review of Systems  Constitutional: Positive for chills.  Respiratory: Positive for cough.   All other systems reviewed and are negative.    Physical Exam Updated Vital Signs BP 120/78   Pulse 69   Temp 98.3 F (36.8 C) (Oral)   Resp 20   Ht 6' 2" (1.88 m)   Wt 105.7 kg (233 lb)   SpO2 100%   BMI 29.92 kg/m   Physical Exam  Constitutional: He is oriented to person, place, and time.  Chronically ill, slightly dehydrated   HENT:  Head: Normocephalic.  MM slightly dry, OP clear   Eyes: Conjunctivae and EOM are normal. Pupils are equal, round, and reactive to light.  Neck: Normal range of motion. Neck supple.  Cardiovascular: Normal rate, regular rhythm and normal heart sounds.  Pulmonary/Chest: Effort normal and breath sounds normal. No stridor. No respiratory distress. He has no wheezes.  Abdominal: Soft. Bowel sounds are normal. He exhibits no distension. There is no tenderness. There is no guarding.  Musculoskeletal: Normal range of motion.  Neurological: He is alert and oriented to person, place, and time. No cranial nerve deficit. Coordination normal.  Skin: Skin is warm.  Psychiatric: He has a normal mood and affect.  Nursing note and vitals reviewed.    ED Treatments / Results  Labs (all labs ordered are listed, but only abnormal results are displayed) Labs Reviewed  CBC WITH DIFFERENTIAL/PLATELET - Abnormal; Notable for the following components:      Result Value   RBC 3.89 (*)    Hemoglobin 12.4 (*)    HCT 36.3 (*)    Monocytes Absolute 1.1 (*)    All other components within normal limits  COMPREHENSIVE METABOLIC PANEL - Abnormal; Notable for the following components:   Glucose, Bld 116 (*)    Total Protein 6.2 (*)    All other components within normal limits  CULTURE, BLOOD (ROUTINE X  2)  CULTURE, BLOOD (ROUTINE X 2)  URINE CULTURE  INFLUENZA PANEL BY PCR (TYPE A & B)  URINALYSIS, ROUTINE W REFLEX MICROSCOPIC  I-STAT CG4 LACTIC ACID, ED  I-STAT CG4 LACTIC ACID, ED    EKG  EKG Interpretation None       Radiology Dg Chest 2 View  Result Date: 04/03/2017 CLINICAL DATA:  Cough, chills, runny nose, and headache for 3 days. EXAM: CHEST - 2 VIEW COMPARISON:  Chest CT 06/24/2015 FINDINGS: A right jugular Port-A-Cath terminates over the cavoatrial junction. The cardiomediastinal silhouette is within normal limits. The lungs are well inflated and clear. No pleural effusion or pneumothorax is identified.  There is a chronic pathologic T6 compression fracture. IMPRESSION: No active cardiopulmonary disease. Electronically Signed   By: Logan Bores M.D.   On: 04/03/2017 10:02    Procedures Procedures (including critical care time)  Medications Ordered in ED Medications  sodium chloride 0.9 % bolus 1,000 mL (1,000 mLs Intravenous New Bag/Given 04/03/17 1042)  metoCLOPramide (REGLAN) injection 10 mg (10 mg Intravenous Given 04/03/17 1102)  diphenhydrAMINE (BENADRYL) injection 25 mg (25 mg Intravenous Given 04/03/17 1102)  morphine 4 MG/ML injection 4 mg (4 mg Intravenous Given 04/03/17 1102)     Initial Impression / Assessment and Plan / ED Course  I have reviewed the triage vital signs and the nursing notes.  Pertinent labs & imaging results that were available during my care of the patient were reviewed by me and considered in my medical decision making (see chart for details).     Bradley Hunt is a 47 y.o. male here with cough, low grade temp. Had possible flu exposure and he is immunocompromised and is on oral chemo. Will do sepsis workup with labs, lactate, cultures, CXR, UA, flu. Will hydrate and reassess. Well appearing overall.   12:45 PM CXR clear. WBC nl. Flu neg. Felt better with fluids. Given that he is on oral chemo, will give zpack to cover for atypical organisms.  He request refill of his hycodan for cough.    Final Clinical Impressions(s) / ED Diagnoses   Final diagnoses:  None    ED Discharge Orders    None       Drenda Freeze, MD 04/03/17 1247

## 2017-04-07 ENCOUNTER — Inpatient Hospital Stay: Payer: Medicaid Other | Attending: Hematology and Oncology

## 2017-04-07 ENCOUNTER — Inpatient Hospital Stay: Payer: Medicaid Other

## 2017-04-07 ENCOUNTER — Inpatient Hospital Stay (HOSPITAL_BASED_OUTPATIENT_CLINIC_OR_DEPARTMENT_OTHER): Payer: Medicaid Other | Admitting: Medical

## 2017-04-07 VITALS — BP 153/91 | HR 95 | Temp 97.9°F | Resp 18

## 2017-04-07 DIAGNOSIS — C9000 Multiple myeloma not having achieved remission: Secondary | ICD-10-CM

## 2017-04-07 DIAGNOSIS — Z5112 Encounter for antineoplastic immunotherapy: Secondary | ICD-10-CM | POA: Insufficient documentation

## 2017-04-07 DIAGNOSIS — B9789 Other viral agents as the cause of diseases classified elsewhere: Secondary | ICD-10-CM

## 2017-04-07 DIAGNOSIS — J069 Acute upper respiratory infection, unspecified: Secondary | ICD-10-CM

## 2017-04-07 LAB — CBC WITH DIFFERENTIAL/PLATELET
BASOS ABS: 0 10*3/uL (ref 0.0–0.1)
Basophils Relative: 0 %
Eosinophils Absolute: 0 10*3/uL (ref 0.0–0.5)
Eosinophils Relative: 0 %
HEMATOCRIT: 35.8 % — AB (ref 38.4–49.9)
Hemoglobin: 12.3 g/dL — ABNORMAL LOW (ref 13.0–17.1)
Lymphocytes Relative: 13 %
Lymphs Abs: 0.7 10*3/uL — ABNORMAL LOW (ref 0.9–3.3)
MCH: 32 pg (ref 27.2–33.4)
MCHC: 34.5 g/dL (ref 32.0–36.0)
MCV: 92.8 fL (ref 79.3–98.0)
MONO ABS: 0.8 10*3/uL (ref 0.1–0.9)
Monocytes Relative: 14 %
Neutro Abs: 3.9 10*3/uL (ref 1.5–6.5)
Neutrophils Relative %: 73 %
Platelets: 211 10*3/uL (ref 140–400)
RBC: 3.86 MIL/uL — AB (ref 4.20–5.82)
RDW: 14.4 % (ref 11.0–14.6)
WBC: 5.4 10*3/uL (ref 4.0–10.3)

## 2017-04-07 LAB — COMPREHENSIVE METABOLIC PANEL
ALBUMIN: 3.3 g/dL — AB (ref 3.5–5.0)
ALT: 17 U/L (ref 0–55)
AST: 12 U/L (ref 5–34)
Alkaline Phosphatase: 56 U/L (ref 40–150)
Anion gap: 10 (ref 3–11)
BILIRUBIN TOTAL: 0.7 mg/dL (ref 0.2–1.2)
BUN: 16 mg/dL (ref 7–26)
CO2: 23 mmol/L (ref 22–29)
Calcium: 9.3 mg/dL (ref 8.4–10.4)
Chloride: 105 mmol/L (ref 98–109)
Creatinine, Ser: 0.91 mg/dL (ref 0.70–1.30)
GFR calc Af Amer: >60 mL/min (ref >60)
GFR calc non Af Amer: >60 mL/min (ref >60)
GLUCOSE: 111 mg/dL (ref 70–140)
POTASSIUM: 3.7 mmol/L (ref 3.5–5.1)
SODIUM: 138 mmol/L (ref 136–145)
TOTAL PROTEIN: 6.8 g/dL (ref 6.4–8.3)

## 2017-04-07 MED ORDER — PROCHLORPERAZINE MALEATE 10 MG PO TABS
10.0000 mg | ORAL_TABLET | Freq: Once | ORAL | Status: AC
  Administered 2017-04-07: 10 mg via ORAL

## 2017-04-07 MED ORDER — ANTICOAGULANT SODIUM CITRATE 4% (200MG/5ML) IV SOLN
5.0000 mL | Freq: Once | Status: AC
  Administered 2017-04-07: 5 mL via INTRAVENOUS
  Filled 2017-04-07: qty 5

## 2017-04-07 MED ORDER — SODIUM CHLORIDE 0.9% FLUSH
10.0000 mL | Freq: Once | INTRAVENOUS | Status: AC
  Administered 2017-04-07: 10 mL
  Filled 2017-04-07: qty 10

## 2017-04-07 MED ORDER — DIPHENHYDRAMINE HCL 25 MG PO CAPS
50.0000 mg | ORAL_CAPSULE | Freq: Once | ORAL | Status: AC
  Administered 2017-04-07: 50 mg via ORAL

## 2017-04-07 MED ORDER — SODIUM CHLORIDE 0.9 % IV SOLN
16.2000 mg/kg | Freq: Once | INTRAVENOUS | Status: AC
  Administered 2017-04-07: 1700 mg via INTRAVENOUS
  Filled 2017-04-07: qty 80

## 2017-04-07 MED ORDER — ACETAMINOPHEN 325 MG PO TABS
ORAL_TABLET | ORAL | Status: AC
Start: 2017-04-07 — End: 2017-04-07
  Filled 2017-04-07: qty 2

## 2017-04-07 MED ORDER — DIPHENHYDRAMINE HCL 25 MG PO CAPS
ORAL_CAPSULE | ORAL | Status: AC
  Filled 2017-04-07: qty 2

## 2017-04-07 MED ORDER — PROCHLORPERAZINE MALEATE 10 MG PO TABS
ORAL_TABLET | ORAL | Status: AC
  Filled 2017-04-07: qty 1

## 2017-04-07 MED ORDER — HEPARIN SOD (PORK) LOCK FLUSH 100 UNIT/ML IV SOLN
500.0000 [IU] | Freq: Once | INTRAVENOUS | Status: DC | PRN
  Filled 2017-04-07: qty 5

## 2017-04-07 MED ORDER — SODIUM CHLORIDE 0.9% FLUSH
10.0000 mL | INTRAVENOUS | Status: DC | PRN
  Administered 2017-04-07: 10 mL
  Filled 2017-04-07: qty 10

## 2017-04-07 MED ORDER — SODIUM CHLORIDE 0.9 % IV SOLN
20.0000 mg | Freq: Once | INTRAVENOUS | Status: AC
  Administered 2017-04-07: 20 mg via INTRAVENOUS
  Filled 2017-04-07: qty 2

## 2017-04-07 MED ORDER — SODIUM CHLORIDE 0.9 % IV SOLN
Freq: Once | INTRAVENOUS | Status: AC
  Administered 2017-04-07: 09:00:00 via INTRAVENOUS

## 2017-04-07 MED ORDER — ACETAMINOPHEN 325 MG PO TABS
650.0000 mg | ORAL_TABLET | Freq: Once | ORAL | Status: AC
  Administered 2017-04-07: 650 mg via ORAL

## 2017-04-07 NOTE — Progress Notes (Signed)
MD Alvy Bimler aware of pt's recent ED visit.  PA Sandi Mealy to see pt at chairside for assessment. Ok to treat per MD Alvy Bimler based on today's labs and vitals.  PA Lucianne Lei gave ok to treat as well.

## 2017-04-07 NOTE — Patient Instructions (Signed)
[facility] Discharge Instructions for Patients Receiving Chemotherapy  Today you received the following chemotherapy agent: Darzalex  To help prevent nausea and vomiting after your treatment, we encourage you to take your nausea medication as directed.    If you develop nausea and vomiting that is not controlled by your nausea medication, call the clinic.   BELOW ARE SYMPTOMS THAT SHOULD BE REPORTED IMMEDIATELY:  *FEVER GREATER THAN 100.5 F  *CHILLS WITH OR WITHOUT FEVER  NAUSEA AND VOMITING THAT IS NOT CONTROLLED WITH YOUR NAUSEA MEDICATION  *UNUSUAL SHORTNESS OF BREATH  *UNUSUAL BRUISING OR BLEEDING  TENDERNESS IN MOUTH AND THROAT WITH OR WITHOUT PRESENCE OF ULCERS  *URINARY PROBLEMS  *BOWEL PROBLEMS  UNUSUAL RASH Items with * indicate a potential emergency and should be followed up as soon as possible.  Feel free to call the clinic should you have any questions or concerns. The clinic phone number is [phone].  Please show the CHEMO ALERT CARD at check-in to the Emergency Department and triage nurse.  

## 2017-04-08 LAB — CULTURE, BLOOD (ROUTINE X 2)
CULTURE: NO GROWTH
Culture: NO GROWTH
SPECIAL REQUESTS: ADEQUATE
SPECIAL REQUESTS: ADEQUATE

## 2017-04-08 NOTE — Progress Notes (Signed)
The patient was seen in the emergency room on 04/03/2017.  A workup returned negative.  He was given IV fluids.  The patient has a prescription for Bactrim which he is to take on Monday Wednesday and Friday.  He on his own has increased the frequency of this antibiotic.  He was told to push fluids and to begin Mucinex according to package directions when I saw him yesterday.  I told him that all signs indicated that this was a virus.  He called the on-call provider on Monday evening to ask for prescription.  I called the patient on Tuesday morning, 04/08/2017 reiterated my conversation with the patient.  He was told to obtain Mucinex and push fluids.  He expressed understanding and agreement with this plan. Sandi Mealy, MHS, PA-C

## 2017-04-14 ENCOUNTER — Other Ambulatory Visit: Payer: Self-pay | Admitting: Hematology and Oncology

## 2017-04-14 ENCOUNTER — Telehealth: Payer: Self-pay

## 2017-04-14 DIAGNOSIS — C9000 Multiple myeloma not having achieved remission: Secondary | ICD-10-CM

## 2017-04-14 MED ORDER — ACYCLOVIR 400 MG PO TABS: 400.0000 mg | ORAL_TABLET | Freq: Two times a day (BID) | ORAL | 11 refills | Status: DC

## 2017-04-14 NOTE — Telephone Encounter (Signed)
Done and done

## 2017-04-14 NOTE — Telephone Encounter (Signed)
Patient called and left a message to call him.  Called back. He is going to be traveling on his next appt for 4/8 and needs to change it to 4/15.  He needs a new Rx sent to his pharmacy for Acyclovir for 400 mg bid, he has been taking 800 mg is supposed to reduce it.

## 2017-04-24 ENCOUNTER — Other Ambulatory Visit: Payer: Self-pay

## 2017-04-24 DIAGNOSIS — C9000 Multiple myeloma not having achieved remission: Secondary | ICD-10-CM

## 2017-04-24 MED ORDER — POMALIDOMIDE 2 MG PO CAPS: 2.0000 mg | ORAL_CAPSULE | Freq: Every day | ORAL | 11 refills | Status: DC

## 2017-05-05 ENCOUNTER — Ambulatory Visit: Payer: Self-pay

## 2017-05-05 ENCOUNTER — Ambulatory Visit: Payer: Self-pay | Admitting: Hematology and Oncology

## 2017-05-05 ENCOUNTER — Other Ambulatory Visit: Payer: Self-pay

## 2017-05-12 ENCOUNTER — Inpatient Hospital Stay: Payer: Medicaid Other

## 2017-05-12 ENCOUNTER — Inpatient Hospital Stay: Payer: Medicaid Other | Attending: Hematology and Oncology | Admitting: Hematology and Oncology

## 2017-05-12 ENCOUNTER — Other Ambulatory Visit: Payer: Self-pay | Admitting: *Deleted

## 2017-05-12 ENCOUNTER — Encounter: Payer: Self-pay | Admitting: Hematology and Oncology

## 2017-05-12 VITALS — BP 119/73 | HR 73 | Temp 98.2°F | Resp 16

## 2017-05-12 DIAGNOSIS — C9000 Multiple myeloma not having achieved remission: Secondary | ICD-10-CM

## 2017-05-12 DIAGNOSIS — D630 Anemia in neoplastic disease: Secondary | ICD-10-CM

## 2017-05-12 DIAGNOSIS — Z5112 Encounter for antineoplastic immunotherapy: Secondary | ICD-10-CM | POA: Insufficient documentation

## 2017-05-12 DIAGNOSIS — G893 Neoplasm related pain (acute) (chronic): Secondary | ICD-10-CM

## 2017-05-12 DIAGNOSIS — Z114 Encounter for screening for human immunodeficiency virus [HIV]: Secondary | ICD-10-CM

## 2017-05-12 LAB — CBC WITH DIFFERENTIAL/PLATELET
BASOS ABS: 0 10*3/uL (ref 0.0–0.1)
BASOS PCT: 0 %
EOS ABS: 0.2 10*3/uL (ref 0.0–0.5)
EOS PCT: 4 %
HCT: 35.7 % — ABNORMAL LOW (ref 38.4–49.9)
HEMOGLOBIN: 12.3 g/dL — AB (ref 13.0–17.1)
Lymphocytes Relative: 21 %
Lymphs Abs: 1 10*3/uL (ref 0.9–3.3)
MCH: 31.8 pg (ref 27.2–33.4)
MCHC: 34.5 g/dL (ref 32.0–36.0)
MCV: 92.2 fL (ref 79.3–98.0)
Monocytes Absolute: 0.3 10*3/uL (ref 0.1–0.9)
Monocytes Relative: 7 %
NEUTROS PCT: 68 %
Neutro Abs: 3.2 10*3/uL (ref 1.5–6.5)
PLATELETS: 178 10*3/uL (ref 140–400)
RBC: 3.87 MIL/uL — ABNORMAL LOW (ref 4.20–5.82)
RDW: 14 % (ref 11.0–14.6)
WBC: 4.7 10*3/uL (ref 4.0–10.3)

## 2017-05-12 LAB — COMPREHENSIVE METABOLIC PANEL
ALT: 20 U/L (ref 0–55)
AST: 16 U/L (ref 5–34)
Albumin: 3.7 g/dL (ref 3.5–5.0)
Alkaline Phosphatase: 62 U/L (ref 40–150)
Anion gap: 7 (ref 3–11)
BILIRUBIN TOTAL: 0.4 mg/dL (ref 0.2–1.2)
BUN: 11 mg/dL (ref 7–26)
CO2: 24 mmol/L (ref 22–29)
CREATININE: 0.86 mg/dL (ref 0.70–1.30)
Calcium: 9.4 mg/dL (ref 8.4–10.4)
Chloride: 109 mmol/L (ref 98–109)
Glucose, Bld: 109 mg/dL (ref 70–140)
Potassium: 3.7 mmol/L (ref 3.5–5.1)
Sodium: 140 mmol/L (ref 136–145)
TOTAL PROTEIN: 6 g/dL — AB (ref 6.4–8.3)

## 2017-05-12 MED ORDER — SODIUM CHLORIDE 0.9% FLUSH
10.0000 mL | INTRAVENOUS | Status: DC | PRN
  Administered 2017-05-12: 10 mL
  Filled 2017-05-12: qty 10

## 2017-05-12 MED ORDER — PROCHLORPERAZINE MALEATE 10 MG PO TABS
ORAL_TABLET | ORAL | Status: AC
  Filled 2017-05-12: qty 1

## 2017-05-12 MED ORDER — HEPARIN SOD (PORK) LOCK FLUSH 100 UNIT/ML IV SOLN
500.0000 [IU] | Freq: Once | INTRAVENOUS | Status: AC | PRN
  Administered 2017-05-12: 500 [IU]
  Filled 2017-05-12: qty 5

## 2017-05-12 MED ORDER — PROCHLORPERAZINE MALEATE 10 MG PO TABS
10.0000 mg | ORAL_TABLET | Freq: Once | ORAL | Status: AC
  Administered 2017-05-12: 10 mg via ORAL

## 2017-05-12 MED ORDER — DIPHENHYDRAMINE HCL 25 MG PO CAPS
ORAL_CAPSULE | ORAL | Status: AC
  Filled 2017-05-12: qty 2

## 2017-05-12 MED ORDER — OXYCODONE HCL 10 MG PO TABS: 10.0000 mg | ORAL_TABLET | Freq: Four times a day (QID) | ORAL | 0 refills | Status: DC | PRN

## 2017-05-12 MED ORDER — ACETAMINOPHEN 325 MG PO TABS
ORAL_TABLET | ORAL | Status: AC
  Filled 2017-05-12: qty 2

## 2017-05-12 MED ORDER — ACETAMINOPHEN 325 MG PO TABS
650.0000 mg | ORAL_TABLET | Freq: Once | ORAL | Status: AC
  Administered 2017-05-12: 650 mg via ORAL

## 2017-05-12 MED ORDER — LIDOCAINE-PRILOCAINE 2.5-2.5 % EX CREA: TOPICAL_CREAM | CUTANEOUS | 3 refills | Status: DC

## 2017-05-12 MED ORDER — SODIUM CHLORIDE 0.9 % IV SOLN
1700.0000 mg | Freq: Once | INTRAVENOUS | Status: AC
  Administered 2017-05-12: 1700 mg via INTRAVENOUS
  Filled 2017-05-12: qty 80

## 2017-05-12 MED ORDER — SODIUM CHLORIDE 0.9 % IV SOLN
20.0000 mg | Freq: Once | INTRAVENOUS | Status: AC
  Administered 2017-05-12: 20 mg via INTRAVENOUS
  Filled 2017-05-12: qty 2

## 2017-05-12 MED ORDER — SODIUM CHLORIDE 0.9 % IV SOLN
Freq: Once | INTRAVENOUS | Status: AC
  Administered 2017-05-12: 12:00:00 via INTRAVENOUS

## 2017-05-12 MED ORDER — DIPHENHYDRAMINE HCL 25 MG PO CAPS
50.0000 mg | ORAL_CAPSULE | Freq: Once | ORAL | Status: AC
  Administered 2017-05-12: 50 mg via ORAL

## 2017-05-12 MED ORDER — SODIUM CHLORIDE 0.9% FLUSH
10.0000 mL | Freq: Once | INTRAVENOUS | Status: AC
  Administered 2017-05-12: 10 mL
  Filled 2017-05-12: qty 10

## 2017-05-12 MED FILL — LIDOCAINE-PRILOCAINE CREAM: 2.5-2.5 | 10 days supply | Qty: 30 | Fill #0

## 2017-05-12 MED FILL — oxyCODONE HCL 10 MG TABS: 10 | 22 days supply | Qty: 90 | Fill #0

## 2017-05-12 NOTE — Assessment & Plan Note (Signed)
He is concerned about recent exposure to HIV due to unprotected sexual encounter He requests that HIV screening test today I will order HIV antibody screening will call the patient with test results

## 2017-05-12 NOTE — Assessment & Plan Note (Signed)
He tolerated treatment well with positive response to treatment. Last myeloma panel showed complete response to treatment. I have reviewed recommendation for [facility] Currently, his daratumumab is every other week for a few months and then subsequently switched to monthly I will reduce the dose of Pomalyst to 2 mg daily for 21 days, 7 days off He will continue aspirin for DVT prophylaxis.  He will continue acyclovir for antimicrobial prophylaxis He is reminded to take calcium and vitamin D He has recently visited a dentist.  We will call the dentist office for dental clearance before we proceed with Zometa I will see him in May for further toxicity review

## 2017-05-12 NOTE — Assessment & Plan Note (Signed)
This is likely due to recent treatment. The patient denies recent history of bleeding such as epistaxis, hematuria or hematochezia. He is asymptomatic from the anemia. I will observe for now.  He does not require transfusion now. I will continue the chemotherapy at current dose without dosage adjustment.  If the anemia gets progressive worse in the future, I might have to delay his treatment or adjust the chemotherapy dose.  

## 2017-05-12 NOTE — Progress Notes (Signed)
[facility] OFFICE PROGRESS NOTE  Patient Care Team: [name], MD as PCP - General (Internal Medicine)  ASSESSMENT & PLAN:  Multiple myeloma not having achieved remission ([place]) He tolerated treatment well with positive response to treatment. Last myeloma panel showed complete response to treatment. I have reviewed recommendation for [facility] Currently, his daratumumab is every other week for a few months and then subsequently switched to monthly I will reduce the dose of Pomalyst to 2 mg daily for 21 days, 7 days off He will continue aspirin for DVT prophylaxis.  He will continue acyclovir for antimicrobial prophylaxis He is reminded to take calcium and vitamin D He has recently visited a dentist.  We will call the dentist office for dental clearance before we proceed with Zometa I will see him in May for further toxicity review  Cancer associated pain He had persistent cancer back pain He felt better while on oxycodone pain medicine We discussed chronic pain management and narcotic refill policy He is reminded about the risk of sedation, nausea and constipation  Anemia in neoplastic disease This is likely due to recent treatment. The patient denies recent history of bleeding such as epistaxis, hematuria or hematochezia. He is asymptomatic from the anemia. I will observe for now.  He does not require transfusion now. I will continue the chemotherapy at current dose without dosage adjustment.  If the anemia gets progressive worse in the future, I might have to delay his treatment or adjust the chemotherapy dose.   Encounter for screening for HIV He is concerned about recent exposure to HIV due to unprotected sexual encounter He requests that HIV screening test today I will order HIV antibody screening will call the patient with test results   Orders Placed This Encounter  Procedures  . HIV antibody (with reflex)    Standing Status:    Future    Number of Occurrences:   1    Standing Expiration Date:   05/12/2018    INTERVAL HISTORY: Please see below for problem oriented charting. He returns for further follow-up He returns from [place] after 2 weeks stay visiting family The patient disclosed to me that he had unprotected sexual intercourse and is concerned about infection He continues to have chronic intermittent chest wall pain/back pain and requested medication refill He denies recent nausea or changes in bowel habits He is taking his medication as prescribed He has appointment to see his dentist at the end of the month for dental clearance SUMMARY OF ONCOLOGIC HISTORY:   Multiple myeloma not having achieved remission ([place])   06/23/2015 - 06/28/2015 Hospital Admission    The patient was admitted to the hospital due to gait ataxia and back pain. He was subsequently found to have cord compression underwent surgery and was discharged home      06/24/2015 Imaging    Abnormal appearance of the T6 vertebral body, highly suspicious for possible osseous metastasis. Associated pathologic fracture withup to 30% height loss. There is associated abnormal soft tissue density within the ventral epidural space,      06/24/2015 Imaging    MRI lumbar: Focal osseous lesion with abnormal enhancement involving the right pedicle of L3, suspicious for possible osseous metastasisgiven the findings in the thoracic spine. Question additional focal lesion within the right iliac wing as above.        06/25/2015 Pathology Results    Accession: [id number] bone biopsy come from plasma cell neoplasm.  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  Zometa is not given due to inability to get dental clearance      12/14/2015 - 04/24/2016 Chemotherapy    He is started on maintenance treatment with Revlimid only      12/18/2015 Imaging    MRI thoracic and lumbar spine showed numerous enhancing foci throughout the thoracic and lumbar spine with several new small foci in the lumbar spine in comparison with prior MRI compatible with metastatic disease. Stable loss of height of the T3, T4, and T6 vertebral bodies and new postsurgical changes related to T6 laminectomy. No significant epidural disease or evidence for cord compression. No abnormal enhancement of the spinal cord or cauda equina.      05/02/2016 Bone Marrow Biopsy    Outside bone marrow biopsy showed 20% myeloma involvement      05/16/2016 Procedure    Successful placement of a right internal jugular approach power injectable Port-A-Cath. The catheter is ready for immediate use.      05/21/2016 - 07/03/2016 Chemotherapy    He received Kyprolis, Cytoxan and dexamethasone       07/08/2016 Procedure    Status post CT-guided bone marrow biopsy, with tissue specimen sent to pathology for complete histopathologic analysis      07/08/2016 Bone Marrow Biopsy    Bone Marrow, Aspirate,Biopsy, and Clot BONE MARROW: - MILDLY HYPERCELLULAR MARROW (60%) WITH PLASMA CELL NEOPLASM - SEE COMMENT PERIPHERAL BLOOD: - NORMOCYTIC ANEMIA Diagnosis Note The marrow is hypercellular with lambda-restricted plasma cells consistent with persistence of the patient's previously diagnosed plasma cell neoplasm. The plasma cells comprise approximately 10-15% of the total marrow cellularity, are enlarged, and arranged in clusters.      08/14/2016 Miscellaneous    He received conditioning treatment with melphalan      08/15/2016 Bone Marrow Transplant    He received autologous stem cell transplant      08/24/2016 - 08/29/2016 Hospital Admission    His post-transplant course was complicated by E-Coli  bacteremia      11/26/2016 PET scan    PET CT at [facility] 1. Technically limited study due to soft tissue uptake. 2. New hypermetabolic uptake at C7 spinous process and left proximal femur that is of questionable significance in absence of underlying CT correlate. Further assessment with whole body bone scan may be considered. 2. Redemonstrated nonhypermetabolic multifocal lucent and sclerotic lesions throughout the spine which are similar to prior.       12/02/2016 Bone Marrow Biopsy    He had repeat bone marrow biopsy at [facility] An immunohistochemical stain for CD138 is performed on the bone marrow core biopsy demonstrates increased plasma cells with focal clustering (10-20% overall), which are monotypic for lambda light chain by in situ hybridization.      01/06/2017 -  Chemotherapy    He received weekly Dexamethasone, Pomalyst days 1-21 and Daratumumab       REVIEW OF SYSTEMS:   Constitutional: Denies fevers, chills or abnormal weight loss Eyes: Denies blurriness of vision Ears, nose, mouth, throat, and face: Denies mucositis or sore throat Respiratory: Denies cough, dyspnea or wheezes Cardiovascular: Denies palpitation, chest discomfort or lower extremity swelling Gastrointestinal:  Denies nausea, heartburn or change in bowel habits Skin: Denies abnormal skin rashes Lymphatics: Denies new lymphadenopathy or easy bruising Neurological:Denies numbness, tingling or new weaknesses Behavioral/Psych: Mood is stable, no new changes  All other systems were reviewed with the patient and are negative.  I have reviewed the past medical history, past surgical history, social history and family history with the patient and they are unchanged from previous note.  ALLERGIES:  has No Known Allergies.  MEDICATIONS:  Current Outpatient Medications  Medication Sig Dispense Refill  . acyclovir (ZOVIRAX) 400 MG tablet Take 1 tablet (400 mg total) by mouth 2 (two) times daily. 60 tablet 11  .  aspirin EC 81 MG tablet Take 81 mg by mouth daily.    Marland Kitchen azithromycin (ZITHROMAX Z-PAK) 250 MG tablet 2 po day one, then 1 daily x 4 days 6 tablet 0  . dexamethasone (DECADRON) 4 MG tablet Take 5 tablets (20 mg total) by mouth once a week. 20 tablet 0  . HYDROcodone-homatropine (HYCODAN) 5-1.5 MG/5ML syrup Take 5 mLs by mouth every 6 (six) hours as needed for cough. 60 mL 0  . ibuprofen (ADVIL,MOTRIN) 800 MG tablet Take 800 mg by mouth every 8 (eight) hours as needed (pain).    Marland Kitchen lidocaine-prilocaine (EMLA) cream Apply to affected area once 30 g 3  . Oxycodone HCl 10 MG TABS Take 1 tablet (10 mg total) by mouth every 6 (six) hours as needed. 90 tablet 0  . pantoprazole (PROTONIX) 40 MG tablet Take 40 mg by mouth daily.  0  . pomalidomide (POMALYST) 2 MG capsule Take 1 capsule (2 mg total) by mouth daily. Take with water for 21 days on,7 days off. Repeat every 28 days. 21 capsule 11  . sulfamethoxazole-trimethoprim (BACTRIM DS,SEPTRA DS) 800-160 MG tablet Take 1 tablet by mouth. Take 1 tablet every Monday, Wednesday and Friday.     No current facility-administered medications for this visit.    Facility-Administered Medications Ordered in Other Visits  Medication Dose Route Frequency Provider Last Rate Last Dose  . daratumumab (DARZALEX) 1,700 mg in sodium chloride 0.9 % 415 mL chemo infusion  1,700 mg Intravenous Once Alvy Bimler, Dewey Viens, MD      . dexamethasone (DECADRON) 20 mg in sodium chloride 0.9 % 50 mL IVPB  20 mg Intravenous Once Alvy Bimler, Doniven Vanpatten, MD      . heparin lock flush 100 unit/mL  500 Units Intracatheter Once PRN Alvy Bimler, Christel Bai, MD      . sodium chloride flush (NS) 0.9 % injection 10 mL  10 mL Intracatheter PRN Alvy Bimler, Wylee Ogden, MD        PHYSICAL EXAMINATION: ECOG PERFORMANCE STATUS: 1 - Symptomatic but completely ambulatory  Vitals:   05/12/17 1053  BP: 128/82  Pulse: 78  Resp: 18  Temp: 98 F (36.7 C)  SpO2: 100%   Filed Weights   05/12/17 1053  Weight: 226 lb 3.2 oz (102.6 kg)     GENERAL:alert, no distress and comfortable SKIN: skin color, texture, turgor are normal, no rashes or significant lesions EYES: normal, Conjunctiva are pink and non-injected, sclera clear OROPHARYNX:no exudate, no erythema and lips, buccal mucosa, and tongue normal  NECK: supple, thyroid normal size, non-tender, without nodularity LYMPH:  no palpable lymphadenopathy in the cervical, axillary or inguinal LUNGS: clear to auscultation and percussion with normal breathing effort HEART: regular rate & rhythm and no murmurs and no lower extremity edema ABDOMEN:abdomen soft, non-tender and normal bowel sounds Musculoskeletal:no cyanosis of digits and no clubbing  NEURO: alert &  oriented x 3 with fluent speech, no focal motor/sensory deficits  LABORATORY DATA:  I have reviewed the data as listed    Component Value Date/Time   NA 140 05/12/2017 1023   NA 139 01/27/2017 0809   K 3.7 05/12/2017 1023   K 4.1 01/27/2017 0809   CL 109 05/12/2017 1023   CO2 24 05/12/2017 1023   CO2 23 01/27/2017 0809   GLUCOSE 109 05/12/2017 1023   GLUCOSE 122 01/27/2017 0809   BUN 11 05/12/2017 1023   BUN 14.3 01/27/2017 0809   CREATININE 0.86 05/12/2017 1023   CREATININE 0.9 01/27/2017 0809   CALCIUM 9.4 05/12/2017 1023   CALCIUM 9.1 01/27/2017 0809   PROT 6.0 (L) 05/12/2017 1023   PROT 6.2 (L) 01/27/2017 0809   ALBUMIN 3.7 05/12/2017 1023   ALBUMIN 3.7 01/27/2017 0809   AST 16 05/12/2017 1023   AST 10 01/27/2017 0809   ALT 20 05/12/2017 1023   ALT 18 01/27/2017 0809   ALKPHOS 62 05/12/2017 1023   ALKPHOS 42 01/27/2017 0809   BILITOT 0.4 05/12/2017 1023   BILITOT 0.72 01/27/2017 0809   GFRNONAA >60 05/12/2017 1023   GFRAA >60 05/12/2017 1023    No results found for: SPEP, UPEP  Lab Results  Component Value Date   WBC 4.7 05/12/2017   NEUTROABS 3.2 05/12/2017   HGB 12.3 (L) 05/12/2017   HCT 35.7 (L) 05/12/2017   MCV 92.2 05/12/2017   PLT 178 05/12/2017      Chemistry       Component Value Date/Time   NA 140 05/12/2017 1023   NA 139 01/27/2017 0809   K 3.7 05/12/2017 1023   K 4.1 01/27/2017 0809   CL 109 05/12/2017 1023   CO2 24 05/12/2017 1023   CO2 23 01/27/2017 0809   BUN 11 05/12/2017 1023   BUN 14.3 01/27/2017 0809   CREATININE 0.86 05/12/2017 1023   CREATININE 0.9 01/27/2017 0809      Component Value Date/Time   CALCIUM 9.4 05/12/2017 1023   CALCIUM 9.1 01/27/2017 0809   ALKPHOS 62 05/12/2017 1023   ALKPHOS 42 01/27/2017 0809   AST 16 05/12/2017 1023   AST 10 01/27/2017 0809   ALT 20 05/12/2017 1023   ALT 18 01/27/2017 0809   BILITOT 0.4 05/12/2017 1023   BILITOT 0.72 01/27/2017 0809     All questions were answered. The patient knows to call the clinic with any problems, questions or concerns. No barriers to learning was detected.  I spent 20 minutes counseling the patient face to face. The total time spent in the appointment was 25 minutes and more than 50% was on counseling and review of test results  [name], MD 05/12/2017 12:00 PM

## 2017-05-12 NOTE — Assessment & Plan Note (Signed)
He had persistent cancer back pain He felt better while on oxycodone pain medicine We discussed chronic pain management and narcotic refill policy He is reminded about the risk of sedation, nausea and constipation

## 2017-05-12 NOTE — Patient Instructions (Signed)
[facility] Discharge Instructions for Patients Receiving Chemotherapy  Today you received the following chemotherapy agents:  Darzalex (daratumumab)  To help prevent nausea and vomiting after your treatment, we encourage you to take your nausea medication as prescribed.   If you develop nausea and vomiting that is not controlled by your nausea medication, call the clinic.   BELOW ARE SYMPTOMS THAT SHOULD BE REPORTED IMMEDIATELY:  *FEVER GREATER THAN 100.5 F  *CHILLS WITH OR WITHOUT FEVER  NAUSEA AND VOMITING THAT IS NOT CONTROLLED WITH YOUR NAUSEA MEDICATION  *UNUSUAL SHORTNESS OF BREATH  *UNUSUAL BRUISING OR BLEEDING  TENDERNESS IN MOUTH AND THROAT WITH OR WITHOUT PRESENCE OF ULCERS  *URINARY PROBLEMS  *BOWEL PROBLEMS  UNUSUAL RASH Items with * indicate a potential emergency and should be followed up as soon as possible.  Feel free to call the clinic should you have any questions or concerns. The clinic phone number is [phone].  Please show the CHEMO ALERT CARD at check-in to the Emergency Department and triage nurse.   

## 2017-05-13 ENCOUNTER — Telehealth: Payer: Self-pay

## 2017-05-13 LAB — HIV ANTIBODY (ROUTINE TESTING W REFLEX): HIV Screen 4th Generation wRfx: NONREACTIVE

## 2017-05-13 LAB — KAPPA/LAMBDA LIGHT CHAINS
Kappa free light chain: 5.2 mg/L (ref 3.3–19.4)
Kappa, lambda light chain ratio: 0.45 (ref 0.26–1.65)
Lambda free light chains: 11.6 mg/L (ref 5.7–26.3)

## 2017-05-13 NOTE — Telephone Encounter (Signed)
-----   Message from [name], MD sent at 05/13/2017  8:00 AM EDT ----- Regarding: neg test pls let him know HIV test is neg ----- Message ----- From: Interface, Lab In [facility] Sent: 05/13/2017   3:36 AM To: [name], MD

## 2017-05-13 NOTE — Telephone Encounter (Signed)
Called and given below message. Verbalized understanding. 

## 2017-05-26 ENCOUNTER — Inpatient Hospital Stay: Payer: Medicaid Other

## 2017-05-26 ENCOUNTER — Other Ambulatory Visit: Payer: Self-pay

## 2017-05-26 ENCOUNTER — Other Ambulatory Visit: Payer: Self-pay | Admitting: *Deleted

## 2017-05-26 ENCOUNTER — Other Ambulatory Visit: Payer: Self-pay | Admitting: Hematology and Oncology

## 2017-05-26 VITALS — BP 141/87 | HR 72 | Temp 98.0°F | Resp 18 | Wt 225.0 lb

## 2017-05-26 DIAGNOSIS — C9000 Multiple myeloma not having achieved remission: Secondary | ICD-10-CM

## 2017-05-26 DIAGNOSIS — Z5112 Encounter for antineoplastic immunotherapy: Secondary | ICD-10-CM | POA: Diagnosis not present

## 2017-05-26 LAB — COMPREHENSIVE METABOLIC PANEL
ALBUMIN: 3.8 g/dL (ref 3.5–5.0)
ALK PHOS: 52 U/L (ref 40–150)
ALT: 28 U/L (ref 0–55)
AST: 21 U/L (ref 5–34)
Anion gap: 8 (ref 3–11)
BUN: 11 mg/dL (ref 7–26)
CO2: 23 mmol/L (ref 22–29)
CREATININE: 0.87 mg/dL (ref 0.70–1.30)
Calcium: 9.1 mg/dL (ref 8.4–10.4)
Chloride: 108 mmol/L (ref 98–109)
GFR calc Af Amer: >60 mL/min (ref >60)
GFR calc non Af Amer: >60 mL/min (ref >60)
GLUCOSE: 92 mg/dL (ref 70–140)
Potassium: 3.6 mmol/L (ref 3.5–5.1)
SODIUM: 139 mmol/L (ref 136–145)
Total Bilirubin: 0.6 mg/dL (ref 0.2–1.2)
Total Protein: 6 g/dL — ABNORMAL LOW (ref 6.4–8.3)

## 2017-05-26 LAB — CBC WITH DIFFERENTIAL/PLATELET
BASOS PCT: 1 %
Basophils Absolute: 0 10*3/uL (ref 0.0–0.1)
EOS ABS: 0.2 10*3/uL (ref 0.0–0.5)
Eosinophils Relative: 8 %
HCT: 34.8 % — ABNORMAL LOW (ref 38.4–49.9)
Hemoglobin: 12 g/dL — ABNORMAL LOW (ref 13.0–17.1)
Lymphocytes Relative: 34 %
Lymphs Abs: 1.1 10*3/uL (ref 0.9–3.3)
MCH: 32 pg (ref 27.2–33.4)
MCHC: 34.5 g/dL (ref 32.0–36.0)
MCV: 92.8 fL (ref 79.3–98.0)
MONO ABS: 0.5 10*3/uL (ref 0.1–0.9)
MONOS PCT: 16 %
Neutro Abs: 1.3 10*3/uL — ABNORMAL LOW (ref 1.5–6.5)
Neutrophils Relative %: 41 %
Platelets: 161 10*3/uL (ref 140–400)
RBC: 3.75 MIL/uL — ABNORMAL LOW (ref 4.20–5.82)
RDW: 14.2 % (ref 11.0–14.6)
WBC: 3.2 10*3/uL — ABNORMAL LOW (ref 4.0–10.3)

## 2017-05-26 MED ORDER — SODIUM CHLORIDE 0.9 % IV SOLN
20.0000 mg | Freq: Once | INTRAVENOUS | Status: AC
  Administered 2017-05-26: 20 mg via INTRAVENOUS
  Filled 2017-05-26: qty 2

## 2017-05-26 MED ORDER — ACETAMINOPHEN 325 MG PO TABS
650.0000 mg | ORAL_TABLET | Freq: Once | ORAL | Status: AC
  Administered 2017-05-26: 650 mg via ORAL

## 2017-05-26 MED ORDER — DIPHENHYDRAMINE HCL 25 MG PO CAPS
ORAL_CAPSULE | ORAL | Status: AC
Start: 2017-05-26 — End: 2017-05-26
  Filled 2017-05-26: qty 2

## 2017-05-26 MED ORDER — ACETAMINOPHEN 325 MG PO TABS
ORAL_TABLET | ORAL | Status: AC
  Filled 2017-05-26: qty 2

## 2017-05-26 MED ORDER — POMALIDOMIDE 2 MG PO CAPS: 2.0000 mg | ORAL_CAPSULE | Freq: Every day | ORAL | 11 refills | Status: DC

## 2017-05-26 MED ORDER — DIPHENHYDRAMINE HCL 25 MG PO CAPS
50.0000 mg | ORAL_CAPSULE | Freq: Once | ORAL | Status: AC
  Administered 2017-05-26: 50 mg via ORAL

## 2017-05-26 MED ORDER — PROCHLORPERAZINE MALEATE 10 MG PO TABS
ORAL_TABLET | ORAL | Status: AC
  Filled 2017-05-26: qty 1

## 2017-05-26 MED ORDER — SODIUM CHLORIDE 0.9 % IV SOLN
Freq: Once | INTRAVENOUS | Status: AC
  Administered 2017-05-26: 09:00:00 via INTRAVENOUS

## 2017-05-26 MED ORDER — HEPARIN SOD (PORK) LOCK FLUSH 100 UNIT/ML IV SOLN
500.0000 [IU] | Freq: Once | INTRAVENOUS | Status: AC | PRN
  Administered 2017-05-26: 500 [IU]
  Filled 2017-05-26: qty 5

## 2017-05-26 MED ORDER — SODIUM CHLORIDE 0.9% FLUSH
10.0000 mL | INTRAVENOUS | Status: DC | PRN
  Administered 2017-05-26: 10 mL
  Filled 2017-05-26: qty 10

## 2017-05-26 MED ORDER — SODIUM CHLORIDE 0.9 % IV SOLN
16.2000 mg/kg | Freq: Once | INTRAVENOUS | Status: AC
  Administered 2017-05-26: 1700 mg via INTRAVENOUS
  Filled 2017-05-26: qty 80

## 2017-05-26 MED ORDER — PROCHLORPERAZINE MALEATE 10 MG PO TABS
10.0000 mg | ORAL_TABLET | Freq: Once | ORAL | Status: AC
  Administered 2017-05-26: 10 mg via ORAL

## 2017-05-26 NOTE — Patient Instructions (Signed)
[facility] Center Discharge Instructions for Patients Receiving Chemotherapy  Today you received the following chemotherapy agents:  Darzalex (daratumumab)  To help prevent nausea and vomiting after your treatment, we encourage you to take your nausea medication as prescribed.   If you develop nausea and vomiting that is not controlled by your nausea medication, call the clinic.   BELOW ARE SYMPTOMS THAT SHOULD BE REPORTED IMMEDIATELY:  *FEVER GREATER THAN 100.5 F  *CHILLS WITH OR WITHOUT FEVER  NAUSEA AND VOMITING THAT IS NOT CONTROLLED WITH YOUR NAUSEA MEDICATION  *UNUSUAL SHORTNESS OF BREATH  *UNUSUAL BRUISING OR BLEEDING  TENDERNESS IN MOUTH AND THROAT WITH OR WITHOUT PRESENCE OF ULCERS  *URINARY PROBLEMS  *BOWEL PROBLEMS  UNUSUAL RASH Items with * indicate a potential emergency and should be followed up as soon as possible.  Feel free to call the clinic should you have any questions or concerns. The clinic phone number is [phone].  Please show the CHEMO ALERT CARD at check-in to the Emergency Department and triage nurse.   

## 2017-06-04 ENCOUNTER — Telehealth: Payer: Self-pay

## 2017-06-04 NOTE — Telephone Encounter (Signed)
Pt called and left a VM but didn't specify what it was about.  I called him, no answer, no option to leave VM.

## 2017-06-05 ENCOUNTER — Telehealth: Payer: Self-pay

## 2017-06-05 ENCOUNTER — Telehealth: Payer: Self-pay | Admitting: *Deleted

## 2017-06-05 NOTE — Telephone Encounter (Signed)
He called and left a message to call him back.  Called back. He is fasting from 5 am to 8 pm daily for religious reasons for the month of May. He is not feeling well since the fasting started. Should he continue fasting? What does Dr. Alvy Bimler think?

## 2017-06-05 NOTE — Telephone Encounter (Signed)
Called with below message. Verbalized understanding. 

## 2017-06-05 NOTE — Telephone Encounter (Signed)
Call received from [facility] requesting collaborative nurse.  "No, I do not have any symptoms requiring triage.  I just have a question for my doctor's nurse."  Call transferred to collaborative to help with this request.

## 2017-06-05 NOTE — Telephone Encounter (Signed)
I think he needs to listen to his body. If he cannot fast, do not fast

## 2017-06-09 ENCOUNTER — Inpatient Hospital Stay: Payer: Medicaid Other

## 2017-06-09 ENCOUNTER — Inpatient Hospital Stay (HOSPITAL_BASED_OUTPATIENT_CLINIC_OR_DEPARTMENT_OTHER): Payer: Medicaid Other | Admitting: Hematology and Oncology

## 2017-06-09 ENCOUNTER — Inpatient Hospital Stay: Payer: Medicaid Other | Attending: Hematology and Oncology

## 2017-06-09 ENCOUNTER — Telehealth: Payer: Self-pay | Admitting: Hematology and Oncology

## 2017-06-09 ENCOUNTER — Encounter: Payer: Self-pay | Admitting: Hematology and Oncology

## 2017-06-09 VITALS — BP 126/90 | HR 67 | Temp 98.5°F | Resp 16

## 2017-06-09 DIAGNOSIS — D61818 Other pancytopenia: Secondary | ICD-10-CM

## 2017-06-09 DIAGNOSIS — Z5112 Encounter for antineoplastic immunotherapy: Secondary | ICD-10-CM | POA: Insufficient documentation

## 2017-06-09 DIAGNOSIS — C9000 Multiple myeloma not having achieved remission: Secondary | ICD-10-CM

## 2017-06-09 DIAGNOSIS — G893 Neoplasm related pain (acute) (chronic): Secondary | ICD-10-CM | POA: Diagnosis not present

## 2017-06-09 LAB — COMPREHENSIVE METABOLIC PANEL
ALT: 23 U/L (ref 0–55)
AST: 21 U/L (ref 5–34)
Albumin: 3.9 g/dL (ref 3.5–5.0)
Alkaline Phosphatase: 54 U/L (ref 40–150)
Anion gap: 6 (ref 3–11)
BILIRUBIN TOTAL: 0.5 mg/dL (ref 0.2–1.2)
BUN: 20 mg/dL (ref 7–26)
CO2: 26 mmol/L (ref 22–29)
CREATININE: 1.06 mg/dL (ref 0.70–1.30)
Calcium: 9.2 mg/dL (ref 8.4–10.4)
Chloride: 108 mmol/L (ref 98–109)
Glucose, Bld: 111 mg/dL (ref 70–140)
POTASSIUM: 3.8 mmol/L (ref 3.5–5.1)
Sodium: 140 mmol/L (ref 136–145)
TOTAL PROTEIN: 6.1 g/dL — AB (ref 6.4–8.3)

## 2017-06-09 LAB — CBC WITH DIFFERENTIAL/PLATELET
BASOS ABS: 0.1 10*3/uL (ref 0.0–0.1)
Basophils Relative: 2 %
EOS ABS: 0.3 10*3/uL (ref 0.0–0.5)
EOS PCT: 8 %
HCT: 35.7 % — ABNORMAL LOW (ref 38.4–49.9)
HEMOGLOBIN: 12 g/dL — AB (ref 13.0–17.1)
Lymphocytes Relative: 31 %
Lymphs Abs: 1.2 10*3/uL (ref 0.9–3.3)
MCH: 31.3 pg (ref 27.2–33.4)
MCHC: 33.6 g/dL (ref 32.0–36.0)
MCV: 93 fL (ref 79.3–98.0)
Monocytes Absolute: 0.5 10*3/uL (ref 0.1–0.9)
Monocytes Relative: 15 %
NEUTROS PCT: 44 %
Neutro Abs: 1.7 10*3/uL (ref 1.5–6.5)
PLATELETS: 174 10*3/uL (ref 140–400)
RBC: 3.84 MIL/uL — AB (ref 4.20–5.82)
RDW: 13.7 % (ref 11.0–14.6)
WBC: 3.7 10*3/uL — AB (ref 4.0–10.3)

## 2017-06-09 MED ORDER — HEPARIN SOD (PORK) LOCK FLUSH 100 UNIT/ML IV SOLN
500.0000 [IU] | Freq: Once | INTRAVENOUS | Status: AC | PRN
Start: 2017-06-09 — End: 2017-06-09
  Administered 2017-06-09: 500 [IU]
  Filled 2017-06-09: qty 5

## 2017-06-09 MED ORDER — PROCHLORPERAZINE MALEATE 10 MG PO TABS
ORAL_TABLET | ORAL | Status: AC
  Filled 2017-06-09: qty 1

## 2017-06-09 MED ORDER — SODIUM CHLORIDE 0.9 % IV SOLN
Freq: Once | INTRAVENOUS | Status: AC
  Administered 2017-06-09: 10:00:00 via INTRAVENOUS

## 2017-06-09 MED ORDER — ACETAMINOPHEN 325 MG PO TABS
ORAL_TABLET | ORAL | Status: AC
  Filled 2017-06-09: qty 2

## 2017-06-09 MED ORDER — DIPHENHYDRAMINE HCL 25 MG PO CAPS
ORAL_CAPSULE | ORAL | Status: AC
  Filled 2017-06-09: qty 2

## 2017-06-09 MED ORDER — SODIUM CHLORIDE 0.9% FLUSH
10.0000 mL | INTRAVENOUS | Status: DC | PRN
  Administered 2017-06-09: 10 mL
  Filled 2017-06-09: qty 10

## 2017-06-09 MED ORDER — OXYCODONE HCL 10 MG PO TABS: 10.0000 mg | ORAL_TABLET | Freq: Four times a day (QID) | ORAL | 0 refills | Status: DC | PRN

## 2017-06-09 MED ORDER — PROCHLORPERAZINE MALEATE 10 MG PO TABS
10.0000 mg | ORAL_TABLET | Freq: Once | ORAL | Status: AC
  Administered 2017-06-09: 10 mg via ORAL

## 2017-06-09 MED ORDER — SODIUM CHLORIDE 0.9 % IV SOLN
20.0000 mg | Freq: Once | INTRAVENOUS | Status: AC
  Administered 2017-06-09: 20 mg via INTRAVENOUS
  Filled 2017-06-09: qty 2

## 2017-06-09 MED ORDER — ACETAMINOPHEN 325 MG PO TABS
650.0000 mg | ORAL_TABLET | Freq: Once | ORAL | Status: AC
  Administered 2017-06-09: 650 mg via ORAL

## 2017-06-09 MED ORDER — SODIUM CHLORIDE 0.9 % IV SOLN
16.2000 mg/kg | Freq: Once | INTRAVENOUS | Status: AC
  Administered 2017-06-09: 1700 mg via INTRAVENOUS
  Filled 2017-06-09: qty 80

## 2017-06-09 MED ORDER — DIPHENHYDRAMINE HCL 25 MG PO CAPS
50.0000 mg | ORAL_CAPSULE | Freq: Once | ORAL | Status: AC
  Administered 2017-06-09: 50 mg via ORAL

## 2017-06-09 MED FILL — oxyCODONE HCL 10 MG TABS: 10 | 22 days supply | Qty: 90 | Fill #0

## 2017-06-09 NOTE — Progress Notes (Signed)
[facility] OFFICE PROGRESS NOTE  Patient Care Team: [name], MD as PCP - General (Internal Medicine)  ASSESSMENT & PLAN:  Multiple myeloma not having achieved remission ([place]) I have reviewed recommendation from [facility] We discussed imaging versus bone marrow biopsy and the role of additional work-up The patient is comfortable not to pursue additional work-up We will proceed with last dose of daratumumab today He will continue Pomalyst single agent I will see him back in a month for further follow-up Due to inability to get dental clearance, we will hold off Zometa  Cancer associated pain He had persistent cancer back pain He felt better while on oxycodone pain medicine We discussed chronic pain management and narcotic refill policy He is reminded about the risk of sedation, nausea and constipation  Pancytopenia, acquired ([place]) This is due to recent transplant and treatment side effects He is not symptomatic No transfusion support is required He will continue antimicrobial therapy as directed   No orders of the defined types were placed in this encounter.   INTERVAL HISTORY: Please see below for problem oriented charting. He returns for chemotherapy and follow-up He continues to have chronic intermittent pain that comes and goes Denies worsening peripheral neuropathy Denies recent infection He is still undergoing dental evaluation and management No recent bone pain   SUMMARY OF ONCOLOGIC HISTORY:   Multiple myeloma not having achieved remission ([place])   06/23/2015 - 06/28/2015 Hospital Admission    The patient was admitted to the hospital due to gait ataxia and back pain. He was subsequently found to have cord compression underwent surgery and was discharged home      06/24/2015 Imaging    Abnormal appearance of the T6 vertebral body, highly suspicious for possible osseous metastasis. Associated pathologic fracture withup  to 30% height loss. There is associated abnormal soft tissue density within the ventral epidural space,      06/24/2015 Imaging    MRI lumbar: Focal osseous lesion with abnormal enhancement involving the right pedicle of L3, suspicious for possible osseous metastasisgiven the findings in the thoracic spine. Question additional focal lesion within the right iliac wing as above.        06/25/2015 Pathology Results    Accession: [id number] bone biopsy come from plasma cell neoplasm.      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 Zometa is not given due to inability to get dental clearance      12/14/2015 - 04/24/2016 Chemotherapy    He is started on maintenance treatment with Revlimid only      12/18/2015 Imaging    MRI thoracic and lumbar spine showed numerous enhancing foci throughout the thoracic and lumbar spine with several new small foci in the lumbar spine in comparison with prior MRI compatible with metastatic disease. Stable loss of height of the T3, T4, and T6 vertebral bodies and new postsurgical changes related to T6 laminectomy. No significant epidural disease or evidence for cord compression. No abnormal enhancement of the spinal cord or cauda equina.      05/02/2016 Bone Marrow Biopsy    Outside bone marrow biopsy showed 20% myeloma involvement  05/16/2016 Procedure    Successful placement of a right internal jugular approach power injectable Port-A-Cath. The catheter is ready for immediate use.      05/21/2016 - 07/03/2016 Chemotherapy    He received Kyprolis, Cytoxan and dexamethasone       07/08/2016 Procedure    Status post CT-guided bone marrow biopsy, with tissue  specimen sent to pathology for complete histopathologic analysis      07/08/2016 Bone Marrow Biopsy    Bone Marrow, Aspirate,Biopsy, and Clot BONE MARROW: - MILDLY HYPERCELLULAR MARROW (60%) WITH PLASMA CELL NEOPLASM - SEE COMMENT PERIPHERAL BLOOD: - NORMOCYTIC ANEMIA Diagnosis Note The marrow is hypercellular with lambda-restricted plasma cells consistent with persistence of the patient's previously diagnosed plasma cell neoplasm. The plasma cells comprise approximately 10-15% of the total marrow cellularity, are enlarged, and arranged in clusters.      08/14/2016 Miscellaneous    He received conditioning treatment with melphalan      08/15/2016 Bone Marrow Transplant    He received autologous stem cell transplant      08/24/2016 - 08/29/2016 Hospital Admission    His post-transplant course was complicated by E-Coli bacteremia      11/26/2016 PET scan    PET CT at [facility] 1. Technically limited study due to soft tissue uptake. 2. New hypermetabolic uptake at C7 spinous process and left proximal femur that is of questionable significance in absence of underlying CT correlate. Further assessment with whole body bone scan may be considered. 2. Redemonstrated nonhypermetabolic multifocal lucent and sclerotic lesions throughout the spine which are similar to prior.       12/02/2016 Bone Marrow Biopsy    He had repeat bone marrow biopsy at [facility] An immunohistochemical stain for CD138 is performed on the bone marrow core biopsy demonstrates increased plasma cells with focal clustering (10-20% overall), which are monotypic for lambda light chain by in situ hybridization.      01/06/2017 -  Chemotherapy    He received weekly Dexamethasone, Pomalyst days 1-21 and Daratumumab       REVIEW OF SYSTEMS:   Constitutional: Denies fevers, chills or abnormal weight loss Eyes: Denies blurriness of vision Ears, nose, mouth, throat, and face: Denies mucositis or sore throat Respiratory: Denies  cough, dyspnea or wheezes Cardiovascular: Denies palpitation, chest discomfort or lower extremity swelling Gastrointestinal:  Denies nausea, heartburn or change in bowel habits Skin: Denies abnormal skin rashes Lymphatics: Denies new lymphadenopathy or easy bruising Neurological:Denies numbness, tingling or new weaknesses Behavioral/Psych: Mood is stable, no new changes  All other systems were reviewed with the patient and are negative.  I have reviewed the past medical history, past surgical history, social history and family history with the patient and they are unchanged from previous note.  ALLERGIES:  has No Known Allergies.  MEDICATIONS:  Current Outpatient Medications  Medication Sig Dispense Refill  . acyclovir (ZOVIRAX) 400 MG tablet Take 1 tablet (400 mg total) by mouth 2 (two) times daily. 60 tablet 11  . aspirin EC 81 MG tablet Take 81 mg by mouth daily.    Marland Kitchen ibuprofen (ADVIL,MOTRIN) 800 MG tablet Take 800 mg by mouth every 8 (eight) hours as needed (pain).    Marland Kitchen lidocaine-prilocaine (EMLA) cream Apply to affected area once 30 g 3  . Oxycodone HCl 10 MG TABS Take 1 tablet (10 mg total) by mouth every 6 (six) hours as needed. 90 tablet 0  . pantoprazole (PROTONIX) 40 MG tablet Take 40 mg by mouth daily.  0  .  pomalidomide (POMALYST) 2 MG capsule Take 1 capsule (2 mg total) by mouth daily. Take with water for 21 days on,7 days off. Repeat every 28 days. 21 capsule 11   No current facility-administered medications for this visit.    Facility-Administered Medications Ordered in Other Visits  Medication Dose Route Frequency Provider Last Rate Last Dose  . 0.9 %  sodium chloride infusion   Intravenous Once Alvy Bimler, Teancum Brule, MD      . acetaminophen (TYLENOL) tablet 650 mg  650 mg Oral Once Alvy Bimler, Jaquetta Currier, MD      . daratumumab (DARZALEX) 1,680 mg in sodium chloride 0.9 % 416 mL chemo infusion  16 mg/kg (Treatment Plan Recorded) Intravenous Once Alvy Bimler, Jemma Rasp, MD      . dexamethasone  (DECADRON) 20 mg in sodium chloride 0.9 % 50 mL IVPB  20 mg Intravenous Once Alvy Bimler, Harman Ferrin, MD      . diphenhydrAMINE (BENADRYL) capsule 50 mg  50 mg Oral Once Alvy Bimler, Willadene Mounsey, MD      . heparin lock flush 100 unit/mL  500 Units Intracatheter Once PRN Alvy Bimler, Kylen Ismael, MD      . prochlorperazine (COMPAZINE) tablet 10 mg  10 mg Oral Once Tomeika Weinmann, MD      . sodium chloride flush (NS) 0.9 % injection 10 mL  10 mL Intracatheter PRN Alvy Bimler, Javarri Segal, MD        PHYSICAL EXAMINATION: ECOG PERFORMANCE STATUS: 1 - Symptomatic but completely ambulatory  Vitals:   06/09/17 0931  BP: 122/74  Pulse: 85  Resp: 18  Temp: 98 F (36.7 C)  SpO2: 100%   Filed Weights   06/09/17 0931  Weight: 228 lb (103.4 kg)    GENERAL:alert, no distress and comfortable SKIN: skin color, texture, turgor are normal, no rashes or significant lesions EYES: normal, Conjunctiva are pink and non-injected, sclera clear OROPHARYNX:no exudate, no erythema and lips, buccal mucosa, and tongue normal  NECK: supple, thyroid normal size, non-tender, without nodularity LYMPH:  no palpable lymphadenopathy in the cervical, axillary or inguinal LUNGS: clear to auscultation and percussion with normal breathing effort HEART: regular rate & rhythm and no murmurs and no lower extremity edema ABDOMEN:abdomen soft, non-tender and normal bowel sounds Musculoskeletal:no cyanosis of digits and no clubbing  NEURO: alert & oriented x 3 with fluent speech, no focal motor/sensory deficits  LABORATORY DATA:  I have reviewed the data as listed    Component Value Date/Time   NA 140 06/09/2017 0901   NA 139 01/27/2017 0809   K 3.8 06/09/2017 0901   K 4.1 01/27/2017 0809   CL 108 06/09/2017 0901   CO2 26 06/09/2017 0901   CO2 23 01/27/2017 0809   GLUCOSE 111 06/09/2017 0901   GLUCOSE 122 01/27/2017 0809   BUN 20 06/09/2017 0901   BUN 14.3 01/27/2017 0809   CREATININE 1.06 06/09/2017 0901   CREATININE 0.9 01/27/2017 0809   CALCIUM 9.2 06/09/2017  0901   CALCIUM 9.1 01/27/2017 0809   PROT 6.1 (L) 06/09/2017 0901   PROT 6.2 (L) 01/27/2017 0809   ALBUMIN 3.9 06/09/2017 0901   ALBUMIN 3.7 01/27/2017 0809   AST 21 06/09/2017 0901   AST 10 01/27/2017 0809   ALT 23 06/09/2017 0901   ALT 18 01/27/2017 0809   ALKPHOS 54 06/09/2017 0901   ALKPHOS 42 01/27/2017 0809   BILITOT 0.5 06/09/2017 0901   BILITOT 0.72 01/27/2017 0809   GFRNONAA >60 06/09/2017 0901   GFRAA >60 06/09/2017 0901    No results found for: SPEP, UPEP  Lab Results  Component Value Date   WBC 3.7 (L) 06/09/2017   NEUTROABS 1.7 06/09/2017   HGB 12.0 (L) 06/09/2017   HCT 35.7 (L) 06/09/2017   MCV 93.0 06/09/2017   PLT 174 06/09/2017      Chemistry      Component Value Date/Time   NA 140 06/09/2017 0901   NA 139 01/27/2017 0809   K 3.8 06/09/2017 0901   K 4.1 01/27/2017 0809   CL 108 06/09/2017 0901   CO2 26 06/09/2017 0901   CO2 23 01/27/2017 0809   BUN 20 06/09/2017 0901   BUN 14.3 01/27/2017 0809   CREATININE 1.06 06/09/2017 0901   CREATININE 0.9 01/27/2017 0809      Component Value Date/Time   CALCIUM 9.2 06/09/2017 0901   CALCIUM 9.1 01/27/2017 0809   ALKPHOS 54 06/09/2017 0901   ALKPHOS 42 01/27/2017 0809   AST 21 06/09/2017 0901   AST 10 01/27/2017 0809   ALT 23 06/09/2017 0901   ALT 18 01/27/2017 0809   BILITOT 0.5 06/09/2017 0901   BILITOT 0.72 01/27/2017 0809      All questions were answered. The patient knows to call the clinic with any problems, questions or concerns. No barriers to learning was detected.  I spent 15 minutes counseling the patient face to face. The total time spent in the appointment was 20 minutes and more than 50% was on counseling and review of test results  [name], MD 06/09/2017 10:07 AM

## 2017-06-09 NOTE — Assessment & Plan Note (Signed)
I have reviewed recommendation from [facility] We discussed imaging versus bone marrow biopsy and the role of additional work-up The patient is comfortable not to pursue additional work-up We will proceed with last dose of daratumumab today He will continue Pomalyst single agent I will see him back in a month for further follow-up Due to inability to get dental clearance, we will hold off Zometa

## 2017-06-09 NOTE — Patient Instructions (Signed)
[facility] Discharge Instructions for Patients Receiving Chemotherapy  Today you received the following chemotherapy agents: Darzalex  To help prevent nausea and vomiting after your treatment, we encourage you to take your nausea medication as directed.    If you develop nausea and vomiting that is not controlled by your nausea medication, call the clinic.   BELOW ARE SYMPTOMS THAT SHOULD BE REPORTED IMMEDIATELY:  *FEVER GREATER THAN 100.5 F  *CHILLS WITH OR WITHOUT FEVER  NAUSEA AND VOMITING THAT IS NOT CONTROLLED WITH YOUR NAUSEA MEDICATION  *UNUSUAL SHORTNESS OF BREATH  *UNUSUAL BRUISING OR BLEEDING  TENDERNESS IN MOUTH AND THROAT WITH OR WITHOUT PRESENCE OF ULCERS  *URINARY PROBLEMS  *BOWEL PROBLEMS  UNUSUAL RASH Items with * indicate a potential emergency and should be followed up as soon as possible.  Feel free to call the clinic should you have any questions or concerns. The clinic phone number is [phone].  Please show the CHEMO ALERT CARD at check-in to the Emergency Department and triage nurse.   

## 2017-06-09 NOTE — Telephone Encounter (Signed)
Gave patient AVs and calendar of upcoming June appointments. °

## 2017-06-09 NOTE — Assessment & Plan Note (Signed)
This is due to recent transplant and treatment side effects He is not symptomatic No transfusion support is required He will continue antimicrobial therapy as directed

## 2017-06-09 NOTE — Assessment & Plan Note (Signed)
He had persistent cancer back pain He felt better while on oxycodone pain medicine We discussed chronic pain management and narcotic refill policy He is reminded about the risk of sedation, nausea and constipation

## 2017-06-10 LAB — KAPPA/LAMBDA LIGHT CHAINS
KAPPA FREE LGHT CHN: 7.6 mg/L (ref 3.3–19.4)
Kappa, lambda light chain ratio: 0.78 (ref 0.26–1.65)
LAMDA FREE LIGHT CHAINS: 9.8 mg/L (ref 5.7–26.3)

## 2017-06-17 ENCOUNTER — Other Ambulatory Visit: Payer: Self-pay

## 2017-06-17 DIAGNOSIS — C9000 Multiple myeloma not having achieved remission: Secondary | ICD-10-CM

## 2017-06-17 MED ORDER — POMALIDOMIDE 2 MG PO CAPS: 2.0000 mg | ORAL_CAPSULE | Freq: Every day | ORAL | 11 refills | Status: DC

## 2017-06-20 ENCOUNTER — Telehealth: Payer: Self-pay

## 2017-06-20 NOTE — Telephone Encounter (Signed)
Pt called requesting a refill on his oxycodone.  Prescription was last written on 06/09/17 for 90 tablets, one ever 6 hrs as needed.  Pt states he knows our office is closed on Monday and he does not have enough to last through [place]. 5/28. This RN explained to pt, per Dr Alvy Bimler, that his prescription should last through June 4th if he is taking them the way they are prescribed and that he can call back then to request a new prescription.  Pt verbalizes understanding that his oxycodone prescription cannot be refilled prior to June 4th.

## 2017-06-24 ENCOUNTER — Ambulatory Visit: Payer: Self-pay

## 2017-06-24 ENCOUNTER — Other Ambulatory Visit: Payer: Self-pay

## 2017-07-01 ENCOUNTER — Telehealth: Payer: Self-pay | Admitting: *Deleted

## 2017-07-01 NOTE — Telephone Encounter (Signed)
Pt called requesting a refill of Oxycodone. ?

## 2017-07-02 ENCOUNTER — Other Ambulatory Visit: Payer: Self-pay | Admitting: Hematology and Oncology

## 2017-07-02 MED ORDER — OXYCODONE HCL 10 MG PO TABS: 10.0000 mg | ORAL_TABLET | Freq: Four times a day (QID) | ORAL | 0 refills | Status: DC | PRN

## 2017-07-02 MED FILL — oxyCODONE HCL 10 MG TABS: 10 | 22 days supply | Qty: 90 | Fill #0

## 2017-07-02 NOTE — Telephone Encounter (Signed)
Attempted to call and tell him Rx is ready for pick up. Will try again later.

## 2017-07-02 NOTE — Telephone Encounter (Signed)
Called and told him Rx ready for pick up. Verbalized understanding.

## 2017-07-07 ENCOUNTER — Inpatient Hospital Stay (HOSPITAL_BASED_OUTPATIENT_CLINIC_OR_DEPARTMENT_OTHER): Payer: Medicaid Other | Admitting: Hematology and Oncology

## 2017-07-07 ENCOUNTER — Inpatient Hospital Stay: Payer: Medicaid Other | Attending: Hematology and Oncology

## 2017-07-07 ENCOUNTER — Encounter: Payer: Self-pay | Admitting: Hematology and Oncology

## 2017-07-07 ENCOUNTER — Telehealth: Payer: Self-pay | Admitting: Hematology and Oncology

## 2017-07-07 ENCOUNTER — Inpatient Hospital Stay: Payer: Medicaid Other

## 2017-07-07 ENCOUNTER — Ambulatory Visit: Payer: Self-pay | Admitting: Hematology and Oncology

## 2017-07-07 ENCOUNTER — Other Ambulatory Visit: Payer: Self-pay

## 2017-07-07 VITALS — BP 128/83 | HR 73 | Temp 98.3°F | Resp 18 | Ht 74.0 in | Wt 224.6 lb

## 2017-07-07 DIAGNOSIS — C9000 Multiple myeloma not having achieved remission: Secondary | ICD-10-CM | POA: Diagnosis present

## 2017-07-07 DIAGNOSIS — D61818 Other pancytopenia: Secondary | ICD-10-CM | POA: Insufficient documentation

## 2017-07-07 DIAGNOSIS — G893 Neoplasm related pain (acute) (chronic): Secondary | ICD-10-CM | POA: Insufficient documentation

## 2017-07-07 LAB — CBC WITH DIFFERENTIAL/PLATELET
BASOS PCT: 0 %
Basophils Absolute: 0 10*3/uL (ref 0.0–0.1)
EOS ABS: 0.3 10*3/uL (ref 0.0–0.5)
EOS PCT: 8 %
HCT: 38.7 % (ref 38.4–49.9)
Hemoglobin: 13.1 g/dL (ref 13.0–17.1)
Lymphocytes Relative: 32 %
Lymphs Abs: 1.2 10*3/uL (ref 0.9–3.3)
MCH: 31.5 pg (ref 27.2–33.4)
MCHC: 33.8 g/dL (ref 32.0–36.0)
MCV: 93.1 fL (ref 79.3–98.0)
MONO ABS: 0.7 10*3/uL (ref 0.1–0.9)
MONOS PCT: 19 %
Neutro Abs: 1.5 10*3/uL (ref 1.5–6.5)
Neutrophils Relative %: 41 %
Platelets: 192 10*3/uL (ref 140–400)
RBC: 4.16 MIL/uL — ABNORMAL LOW (ref 4.20–5.82)
RDW: 14.7 % — AB (ref 11.0–14.6)
WBC: 3.7 10*3/uL — ABNORMAL LOW (ref 4.0–10.3)

## 2017-07-07 LAB — COMPREHENSIVE METABOLIC PANEL
ALT: 17 U/L (ref 0–55)
AST: 17 U/L (ref 5–34)
Albumin: 4.2 g/dL (ref 3.5–5.0)
Alkaline Phosphatase: 50 U/L (ref 40–150)
Anion gap: 8 (ref 3–11)
BUN: 22 mg/dL (ref 7–26)
CHLORIDE: 107 mmol/L (ref 98–109)
CO2: 24 mmol/L (ref 22–29)
Calcium: 9.5 mg/dL (ref 8.4–10.4)
Creatinine, Ser: 0.95 mg/dL (ref 0.70–1.30)
Glucose, Bld: 107 mg/dL (ref 70–140)
POTASSIUM: 3.8 mmol/L (ref 3.5–5.1)
SODIUM: 139 mmol/L (ref 136–145)
Total Bilirubin: 0.6 mg/dL (ref 0.2–1.2)
Total Protein: 6.5 g/dL (ref 6.4–8.3)

## 2017-07-07 NOTE — Patient Instructions (Signed)
Implanted Port Home Guide An implanted port is a type of central line that is placed under the skin. Central lines are used to provide IV access when treatment or nutrition needs to be given through a person's veins. Implanted ports are used for long-term IV access. An implanted port may be placed because:  You need IV medicine that would be irritating to the small veins in your hands or arms.  You need long-term IV medicines, such as antibiotics.  You need IV nutrition for a long period.  You need frequent blood draws for lab tests.  You need dialysis.  Implanted ports are usually placed in the chest area, but they can also be placed in the upper arm, the abdomen, or the leg. An implanted port has two main parts:  Reservoir. The reservoir is round and will appear as a small, raised area under your skin. The reservoir is the part where a needle is inserted to give medicines or draw blood.  Catheter. The catheter is a thin, flexible tube that extends from the reservoir. The catheter is placed into a large vein. Medicine that is inserted into the reservoir goes into the catheter and then into the vein.  How will I care for my incision site? Do not get the incision site wet. Bathe or shower as directed by your health care provider. How is my port accessed? Special steps must be taken to access the port:  Before the port is accessed, a numbing cream can be placed on the skin. This helps numb the skin over the port site.  Your health care provider uses a sterile technique to access the port. ? Your health care provider must put on a mask and sterile gloves. ? The skin over your port is cleaned carefully with an antiseptic and allowed to dry. ? The port is gently pinched between sterile gloves, and a needle is inserted into the port.  Only "non-coring" port needles should be used to access the port. Once the port is accessed, a blood return should be checked. This helps ensure that the port  is in the vein and is not clogged.  If your port needs to remain accessed for a constant infusion, a clear (transparent) bandage will be placed over the needle site. The bandage and needle will need to be changed every week, or as directed by your health care provider.  Keep the bandage covering the needle clean and dry. Do not get it wet. Follow your health care provider's instructions on how to take a shower or bath while the port is accessed.  If your port does not need to stay accessed, no bandage is needed over the port.  What is flushing? Flushing helps keep the port from getting clogged. Follow your health care provider's instructions on how and when to flush the port. Ports are usually flushed with saline solution or a medicine called heparin. The need for flushing will depend on how the port is used.  If the port is used for intermittent medicines or blood draws, the port will need to be flushed: ? After medicines have been given. ? After blood has been drawn. ? As part of routine maintenance.  If a constant infusion is running, the port may not need to be flushed.  How long will my port stay implanted? The port can stay in for as long as your health care provider thinks it is needed. When it is time for the port to come out, surgery will be   done to remove it. The procedure is similar to the one performed when the port was put in. When should I seek immediate medical care? When you have an implanted port, you should seek immediate medical care if:  You notice a bad smell coming from the incision site.  You have swelling, redness, or drainage at the incision site.  You have more swelling or pain at the port site or the surrounding area.  You have a fever that is not controlled with medicine.  This information is not intended to replace advice given to you by your health care provider. Make sure you discuss any questions you have with your health care provider. Document  Released: 01/14/2005 Document Revised: 06/22/2015 Document Reviewed: 09/21/2012 Elsevier Interactive Patient Education  2017 [employer] Inc.  

## 2017-07-07 NOTE — Progress Notes (Signed)
[place] OFFICE PROGRESS NOTE  Patient Care Team: [name], MD as PCP - General (Internal Medicine)  ASSESSMENT & PLAN:  Multiple myeloma not having achieved remission ([place])  I have reviewed recommendation from [facility] We discussed imaging versus bone marrow biopsy and the role of additional work-up The patient is comfortable not to pursue additional work-up We have discontinued daratumumab recently He will continue Pomalyst single agent He is reminded to take acyclovir for antimicrobial prophylaxis He is reminded to take aspirin for DVT prophylaxis Due to inability to get dental clearance, we will hold off Zometa I recommend resuming primary care doctor's visit for health maintenance issues I recommend port removal  I will see him every 3 months  Pancytopenia, acquired ([place]) His pancytopenia is improving He is not symptomatic He will continue reduced dose of Pomalyst  Cancer associated pain He had chronic back pain since his initial surgery for cord compression We discussed narcotic refill policy I recommend a trial of nonnarcotic pain management but the patient declined   Orders Placed This Encounter  Procedures  . IR REMOVAL TUN ACCESS W/ PORT W/O FL MOD SED    Standing Status:   Future    Standing Expiration Date:   09/07/2018    Order Specific Question:   Reason for exam:    Answer:   no need port    Order Specific Question:   Preferred Imaging Location?    Answer:   [facility]  . CBC with Differential/Platelet    Standing Status:   Standing    Number of Occurrences:   22    Standing Expiration Date:   07/08/2018  . Comprehensive metabolic panel    Standing Status:   Standing    Number of Occurrences:   22    Standing Expiration Date:   07/08/2018    INTERVAL HISTORY: Please see below for problem oriented charting. He returns for further follow-up He denies recent dental issues but has appointment to  see dentist His chronic back pain is stable He has occasional GERD/reflux and occasional diarrhea but stable No recent infection, fever or chills He is compliant taking all his medications as directed  SUMMARY OF ONCOLOGIC HISTORY:   Multiple myeloma not having achieved remission ([place])   06/23/2015 - 06/28/2015 Hospital Admission    The patient was admitted to the hospital due to gait ataxia and back pain. He was subsequently found to have cord compression underwent surgery and was discharged home      06/24/2015 Imaging    Abnormal appearance of the T6 vertebral body, highly suspicious for possible osseous metastasis. Associated pathologic fracture withup to 30% height loss. There is associated abnormal soft tissue density within the ventral epidural space,      06/24/2015 Imaging    MRI lumbar: Focal osseous lesion with abnormal enhancement involving the right pedicle of L3, suspicious for possible osseous metastasisgiven the findings in the thoracic spine. Question additional focal lesion within the right iliac wing as above.        06/25/2015 Pathology Results    Accession: [id number] bone biopsy come from plasma cell neoplasm.      06/25/2015 Surgery    He had T6 laminectomy, bilateral transpedicular approach for resection of tumor, decompression of thecal sac and microdissection      07/20/2015 Bone Marrow Biopsy    BM biopsy showed 50% involvement; Cytogenetics 46XY, positive for 13q-      07/31/2015 - 11/03/2015  Chemotherapy    He received Velcade, Revlimid and Dex. Zometa is not given due to inability to get dental clearance      12/14/2015 - 04/24/2016 Chemotherapy    He is started on maintenance treatment with Revlimid only      12/18/2015 Imaging    MRI thoracic and lumbar spine showed numerous enhancing foci throughout the thoracic and lumbar spine with several new small foci in the lumbar spine in comparison with prior MRI compatible with metastatic disease. Stable loss  of height of the T3, T4, and T6 vertebral bodies and new postsurgical changes related to T6 laminectomy. No significant epidural disease or evidence for cord compression. No abnormal enhancement of the spinal cord or cauda equina.      05/02/2016 Bone Marrow Biopsy    Outside bone marrow biopsy showed 20% myeloma involvement      05/16/2016 Procedure    Successful placement of a right internal jugular approach power injectable Port-A-Cath. The catheter is ready for immediate use.      05/21/2016 - 07/03/2016 Chemotherapy    He received Kyprolis, Cytoxan and dexamethasone       07/08/2016 Procedure    Status post CT-guided bone marrow biopsy, with tissue specimen sent to pathology for complete histopathologic analysis      07/08/2016 Bone Marrow Biopsy    Bone Marrow, Aspirate,Biopsy, and Clot BONE MARROW: - MILDLY HYPERCELLULAR MARROW (60%) WITH PLASMA CELL NEOPLASM - SEE COMMENT PERIPHERAL BLOOD: - NORMOCYTIC ANEMIA Diagnosis Note The marrow is hypercellular with lambda-restricted plasma cells consistent with persistence of the patient's previously diagnosed plasma cell neoplasm. The plasma cells comprise approximately 10-15% of the total marrow cellularity, are enlarged, and arranged in clusters.      08/14/2016 Miscellaneous    He received conditioning treatment with melphalan      08/15/2016 Bone Marrow Transplant    He received autologous stem cell transplant      08/24/2016 - 08/29/2016 Hospital Admission    His post-transplant course was complicated by E-Coli bacteremia      11/26/2016 PET scan    PET CT at [facility] 1. Technically limited study due to soft tissue uptake. 2. New hypermetabolic uptake at C7 spinous process and left proximal femur that is of questionable significance in absence of underlying CT correlate. Further assessment with whole body bone scan may be considered. 2. Redemonstrated nonhypermetabolic multifocal lucent and sclerotic lesions throughout the spine  which are similar to prior.       12/02/2016 Bone Marrow Biopsy    He had repeat bone marrow biopsy at [facility] An immunohistochemical stain for CD138 is performed on the bone marrow core biopsy demonstrates increased plasma cells with focal clustering (10-20% overall), which are monotypic for lambda light chain by in situ hybridization.      01/06/2017 - 06/09/2017 Chemotherapy    He received weekly Dexamethasone, Pomalyst days 1-21 and Daratumumab. From 06/08/17 onwards, he is placed on maintenance Pomalyst only       REVIEW OF SYSTEMS:   Constitutional: Denies fevers, chills or abnormal weight loss Eyes: Denies blurriness of vision Ears, nose, mouth, throat, and face: Denies mucositis or sore throat Respiratory: Denies cough, dyspnea or wheezes Cardiovascular: Denies palpitation, chest discomfort or lower extremity swelling Skin: Denies abnormal skin rashes Lymphatics: Denies new lymphadenopathy or easy bruising Neurological:Denies numbness, tingling or new weaknesses Behavioral/Psych: Mood is stable, no new changes  All other systems were reviewed with the patient and are negative.  I have reviewed the past medical  history, past surgical history, social history and family history with the patient and they are unchanged from previous note.  ALLERGIES:  has No Known Allergies.  MEDICATIONS:  Current Outpatient Medications  Medication Sig Dispense Refill  . acyclovir (ZOVIRAX) 400 MG tablet Take 1 tablet (400 mg total) by mouth 2 (two) times daily. 60 tablet 11  . aspirin EC 81 MG tablet Take 81 mg by mouth daily.    Marland Kitchen ibuprofen (ADVIL,MOTRIN) 800 MG tablet Take 800 mg by mouth every 8 (eight) hours as needed (pain).    Marland Kitchen lidocaine-prilocaine (EMLA) cream Apply to affected area once 30 g 3  . Oxycodone HCl 10 MG TABS Take 1 tablet (10 mg total) by mouth every 6 (six) hours as needed. 90 tablet 0  . pantoprazole (PROTONIX) 40 MG tablet Take 40 mg by mouth daily.  0  . pomalidomide  (POMALYST) 2 MG capsule Take 1 capsule (2 mg total) by mouth daily. Take with water for 21 days on,7 days off. Repeat every 28 days. 21 capsule 11   No current facility-administered medications for this visit.     PHYSICAL EXAMINATION: ECOG PERFORMANCE STATUS: 1 - Symptomatic but completely ambulatory  Vitals:   07/07/17 0908  BP: 128/83  Pulse: 73  Resp: 18  Temp: 98.3 F (36.8 C)  SpO2: 100%   Filed Weights   07/07/17 0908  Weight: 224 lb 9.6 oz (101.9 kg)    GENERAL:alert, no distress and comfortable SKIN: skin color, texture, turgor are normal, no rashes or significant lesions EYES: normal, Conjunctiva are pink and non-injected, sclera clear OROPHARYNX:no exudate, no erythema and lips, buccal mucosa, and tongue normal  NECK: supple, thyroid normal size, non-tender, without nodularity LYMPH:  no palpable lymphadenopathy in the cervical, axillary or inguinal LUNGS: clear to auscultation and percussion with normal breathing effort HEART: regular rate & rhythm and no murmurs and no lower extremity edema ABDOMEN:abdomen soft, non-tender and normal bowel sounds Musculoskeletal:no cyanosis of digits and no clubbing  NEURO: alert & oriented x 3 with fluent speech, no focal motor/sensory deficits  LABORATORY DATA:  I have reviewed the data as listed    Component Value Date/Time   NA 140 06/09/2017 0901   NA 139 01/27/2017 0809   K 3.8 06/09/2017 0901   K 4.1 01/27/2017 0809   CL 108 06/09/2017 0901   CO2 26 06/09/2017 0901   CO2 23 01/27/2017 0809   GLUCOSE 111 06/09/2017 0901   GLUCOSE 122 01/27/2017 0809   BUN 20 06/09/2017 0901   BUN 14.3 01/27/2017 0809   CREATININE 1.06 06/09/2017 0901   CREATININE 0.9 01/27/2017 0809   CALCIUM 9.2 06/09/2017 0901   CALCIUM 9.1 01/27/2017 0809   PROT 6.1 (L) 06/09/2017 0901   PROT 6.2 (L) 01/27/2017 0809   ALBUMIN 3.9 06/09/2017 0901   ALBUMIN 3.7 01/27/2017 0809   AST 21 06/09/2017 0901   AST 10 01/27/2017 0809   ALT 23  06/09/2017 0901   ALT 18 01/27/2017 0809   ALKPHOS 54 06/09/2017 0901   ALKPHOS 42 01/27/2017 0809   BILITOT 0.5 06/09/2017 0901   BILITOT 0.72 01/27/2017 0809   GFRNONAA >60 06/09/2017 0901   GFRAA >60 06/09/2017 0901    No results found for: SPEP, UPEP  Lab Results  Component Value Date   WBC 3.7 (L) 07/07/2017   NEUTROABS 1.5 07/07/2017   HGB 13.1 07/07/2017   HCT 38.7 07/07/2017   MCV 93.1 07/07/2017   PLT 192 07/07/2017  Chemistry      Component Value Date/Time   NA 140 06/09/2017 0901   NA 139 01/27/2017 0809   K 3.8 06/09/2017 0901   K 4.1 01/27/2017 0809   CL 108 06/09/2017 0901   CO2 26 06/09/2017 0901   CO2 23 01/27/2017 0809   BUN 20 06/09/2017 0901   BUN 14.3 01/27/2017 0809   CREATININE 1.06 06/09/2017 0901   CREATININE 0.9 01/27/2017 0809      Component Value Date/Time   CALCIUM 9.2 06/09/2017 0901   CALCIUM 9.1 01/27/2017 0809   ALKPHOS 54 06/09/2017 0901   ALKPHOS 42 01/27/2017 0809   AST 21 06/09/2017 0901   AST 10 01/27/2017 0809   ALT 23 06/09/2017 0901   ALT 18 01/27/2017 0809   BILITOT 0.5 06/09/2017 0901   BILITOT 0.72 01/27/2017 0809       All questions were answered. The patient knows to call the clinic with any problems, questions or concerns. No barriers to learning was detected.  I spent 15 minutes counseling the patient face to face. The total time spent in the appointment was 20 minutes and more than 50% was on counseling and review of test results  [name], MD 07/07/2017 9:18 AM

## 2017-07-07 NOTE — Assessment & Plan Note (Signed)
He had chronic back pain since his initial surgery for cord compression We discussed narcotic refill policy I recommend a trial of nonnarcotic pain management but the patient declined

## 2017-07-07 NOTE — Telephone Encounter (Signed)
Gave patient avs and calendar of upcoming September appointments.  °

## 2017-07-07 NOTE — Assessment & Plan Note (Addendum)
I have reviewed recommendation from [facility] We discussed imaging versus bone marrow biopsy and the role of additional work-up The patient is comfortable not to pursue additional work-up We have discontinued daratumumab recently He will continue Pomalyst single agent He is reminded to take acyclovir for antimicrobial prophylaxis He is reminded to take aspirin for DVT prophylaxis Due to inability to get dental clearance, we will hold off Zometa I recommend resuming primary care doctor's visit for health maintenance issues I recommend port removal  I will see him every 3 months

## 2017-07-07 NOTE — Assessment & Plan Note (Signed)
His pancytopenia is improving He is not symptomatic He will continue reduced dose of Pomalyst

## 2017-07-08 LAB — KAPPA/LAMBDA LIGHT CHAINS
KAPPA FREE LGHT CHN: 8.8 mg/L (ref 3.3–19.4)
Kappa, lambda light chain ratio: 0.93 (ref 0.26–1.65)
LAMDA FREE LIGHT CHAINS: 9.5 mg/L (ref 5.7–26.3)

## 2017-07-09 ENCOUNTER — Other Ambulatory Visit: Payer: Self-pay | Admitting: *Deleted

## 2017-07-09 DIAGNOSIS — C9000 Multiple myeloma not having achieved remission: Secondary | ICD-10-CM

## 2017-07-09 MED ORDER — POMALIDOMIDE 2 MG PO CAPS: 2.0000 mg | ORAL_CAPSULE | Freq: Every day | ORAL | 11 refills | Status: DC

## 2017-07-21 ENCOUNTER — Other Ambulatory Visit: Payer: Self-pay | Admitting: Radiology

## 2017-07-22 ENCOUNTER — Other Ambulatory Visit: Payer: Self-pay | Admitting: Radiology

## 2017-07-23 ENCOUNTER — Encounter (HOSPITAL_COMMUNITY): Payer: Self-pay

## 2017-07-23 ENCOUNTER — Ambulatory Visit (HOSPITAL_COMMUNITY)
Admission: RE | Admit: 2017-07-23 | Discharge: 2017-07-23 | Disposition: A | Discharge status: Home / Self Care | Payer: Medicaid Other | Source: Ambulatory Visit | Attending: Hematology and Oncology | Admitting: Hematology and Oncology

## 2017-07-23 DIAGNOSIS — Z452 Encounter for adjustment and management of vascular access device: Secondary | ICD-10-CM | POA: Insufficient documentation

## 2017-07-23 DIAGNOSIS — Z7982 Long term (current) use of aspirin: Secondary | ICD-10-CM | POA: Diagnosis not present

## 2017-07-23 DIAGNOSIS — C7951 Secondary malignant neoplasm of bone: Secondary | ICD-10-CM | POA: Insufficient documentation

## 2017-07-23 DIAGNOSIS — Z9221 Personal history of antineoplastic chemotherapy: Secondary | ICD-10-CM | POA: Insufficient documentation

## 2017-07-23 DIAGNOSIS — C9000 Multiple myeloma not having achieved remission: Secondary | ICD-10-CM

## 2017-07-23 DIAGNOSIS — Z923 Personal history of irradiation: Secondary | ICD-10-CM | POA: Insufficient documentation

## 2017-07-23 DIAGNOSIS — Z87891 Personal history of nicotine dependence: Secondary | ICD-10-CM | POA: Insufficient documentation

## 2017-07-23 HISTORY — PX: IR REMOVAL TUN ACCESS W/ PORT W/O FL MOD SED: IMG2290

## 2017-07-23 LAB — CBC WITH DIFFERENTIAL/PLATELET
Basophils Absolute: 0 10*3/uL (ref 0.0–0.1)
Basophils Relative: 1 %
EOS ABS: 0.5 10*3/uL (ref 0.0–0.7)
EOS PCT: 17 %
HCT: 39.9 % (ref 39.0–52.0)
HEMOGLOBIN: 13.7 g/dL (ref 13.0–17.0)
LYMPHS ABS: 0.8 10*3/uL (ref 0.7–4.0)
LYMPHS PCT: 27 %
MCH: 31.8 pg (ref 26.0–34.0)
MCHC: 34.3 g/dL (ref 30.0–36.0)
MCV: 92.6 fL (ref 78.0–100.0)
MONO ABS: 0.4 10*3/uL (ref 0.1–1.0)
Monocytes Relative: 15 %
Neutro Abs: 1.2 10*3/uL — ABNORMAL LOW (ref 1.7–7.7)
Neutrophils Relative %: 40 %
Platelets: 152 10*3/uL (ref 150–400)
RBC: 4.31 MIL/uL (ref 4.22–5.81)
RDW: 13.3 % (ref 11.5–15.5)
WBC: 2.9 10*3/uL — AB (ref 4.0–10.5)

## 2017-07-23 LAB — PROTIME-INR
INR: 1.12
PROTHROMBIN TIME: 14.3 s (ref 11.4–15.2)

## 2017-07-23 MED ORDER — HYDROCODONE-ACETAMINOPHEN 5-325 MG PO TABS: 1.0000 | ORAL_TABLET | ORAL | Status: DC | PRN

## 2017-07-23 MED ORDER — LIDOCAINE-EPINEPHRINE (PF) 2 %-1:200000 IJ SOLN
INTRAMUSCULAR | Status: AC | PRN
  Administered 2017-07-23: 20 mL

## 2017-07-23 MED ORDER — LIDOCAINE-EPINEPHRINE (PF) 2 %-1:200000 IJ SOLN
INTRAMUSCULAR | Status: AC
  Filled 2017-07-23: qty 20

## 2017-07-23 MED ORDER — FENTANYL CITRATE (PF) 100 MCG/2ML IJ SOLN
INTRAMUSCULAR | Status: AC
  Filled 2017-07-23: qty 2

## 2017-07-23 MED ORDER — MIDAZOLAM HCL 2 MG/2ML IJ SOLN
INTRAMUSCULAR | Status: AC
Start: 2017-07-23 — End: 2017-07-24
  Filled 2017-07-23: qty 4

## 2017-07-23 MED ORDER — CEFAZOLIN SODIUM-DEXTROSE 2-4 GM/100ML-% IV SOLN
2.0000 g | INTRAVENOUS | Status: AC
  Administered 2017-07-23: 2 g via INTRAVENOUS

## 2017-07-23 MED ORDER — MIDAZOLAM HCL 2 MG/2ML IJ SOLN
INTRAMUSCULAR | Status: AC | PRN
  Administered 2017-07-23 (×2): 1 mg via INTRAVENOUS

## 2017-07-23 MED ORDER — FENTANYL CITRATE (PF) 100 MCG/2ML IJ SOLN
INTRAMUSCULAR | Status: AC | PRN
  Administered 2017-07-23 (×2): 50 ug via INTRAVENOUS

## 2017-07-23 MED ORDER — CEFAZOLIN SODIUM-DEXTROSE 2-4 GM/100ML-% IV SOLN
INTRAVENOUS | Status: AC
  Filled 2017-07-23: qty 100

## 2017-07-23 MED ORDER — SODIUM CHLORIDE 0.9 % IV SOLN
INTRAVENOUS | Status: DC
  Administered 2017-07-23: 12:00:00 via INTRAVENOUS

## 2017-07-23 NOTE — Discharge Instructions (Signed)
Implanted Port Removal, Care After °Refer to this sheet in the next few weeks. These instructions provide you with information about caring for yourself after your procedure. Your health care provider may also give you more specific instructions. Your treatment has been planned according to current medical practices, but problems sometimes occur. Call your health care provider if you have any problems or questions after your procedure. °What can I expect after the procedure? °After the procedure, it is common to have: °· Soreness or pain near your incision. °· Some swelling or bruising near your incision. ° °Follow these instructions at home: °Medicines °· Take over-the-counter and prescription medicines only as told by your health care provider. °· If you were prescribed an antibiotic medicine, take it as told by your health care provider. Do not stop taking the antibiotic even if you start to feel better. °Bathing °· Do not take baths, swim, or use a hot tub until your health care provider approves. Ask your health care provider if you can take showers. You may only be allowed to take sponge baths for bathing. °Incision care °· Follow instructions from your health care provider about how to take care of your incision. Make sure you: °? Wash your hands with soap and water before you change your bandage (dressing). If soap and water are not available, use hand sanitizer. °? Change your dressing as told by your health care provider. °? Keep your dressing dry. °? Leave stitches (sutures), skin glue, or adhesive strips in place. These skin closures may need to stay in place for 2 weeks or longer. If adhesive strip edges start to loosen and curl up, you may trim the loose edges. Do not remove adhesive strips completely unless your health care provider tells you to do that. °· Check your incision area every day for signs of infection. Check for: °? More redness, swelling, or pain. °? More fluid or  blood. °? Warmth. °? Pus or a bad smell. °Driving °· If you received a sedative, do not drive for 24 hours after the procedure. °· If you did not receive a sedative, ask your health care provider when it is safe to drive. °Activity °· Return to your normal activities as told by your health care provider. Ask your health care provider what activities are safe for you. °· Until your health care provider says it is safe: °? Do not lift anything that is heavier than 10 lb (4.5 kg). °? Do not do activities that involve lifting your arms over your head. °General instructions °· Do not use any tobacco products, such as cigarettes, chewing tobacco, and e-cigarettes. Tobacco can delay healing. If you need help quitting, ask your health care provider. °· Keep all follow-up visits as told by your health care provider. This is important. °Contact a health care provider if: °· You have more redness, swelling, or pain around your incision. °· You have more fluid or blood coming from your incision. °· Your incision feels warm to the touch. °· You have pus or a bad smell coming from your incision. °· You have a fever. °· You have pain that is not relieved by your pain medicine. °Get help right away if: °· You have chest pain. °· You have difficulty breathing. °This information is not intended to replace advice given to you by your health care provider. Make sure you discuss any questions you have with your health care provider. °Document Released: 12/26/2014 Document Revised: 06/22/2015 Document Reviewed: 10/19/2014 °Elsevier Interactive Patient   Education © 2018 [employer] °Moderate Conscious Sedation, Adult, Care After °These instructions provide you with information about caring for yourself after your procedure. Your health care provider may also give you more specific instructions. Your treatment has been planned according to current medical practices, but problems sometimes occur. Call your health care provider if you have  any problems or questions after your procedure. °What can I expect after the procedure? °After your procedure, it is common: °· To feel sleepy for several hours. °· To feel clumsy and have poor balance for several hours. °· To have poor judgment for several hours. °· To vomit if you eat too soon. ° °Follow these instructions at home: °For at least 24 hours after the procedure: ° °· Do not: °? Participate in activities where you could fall or become injured. °? Drive. °? Use heavy machinery. °? Drink alcohol. °? Take sleeping pills or medicines that cause drowsiness. °? Make important decisions or sign legal documents. °? Take care of children on your own. °· Rest. °Eating and drinking °· Follow the diet recommended by your health care provider. °· If you vomit: °? Drink water, juice, or soup when you can drink without vomiting. °? Make sure you have little or no nausea before eating solid foods. °General instructions °· Have a responsible adult stay with you until you are awake and alert. °· Take over-the-counter and prescription medicines only as told by your health care provider. °· If you smoke, do not smoke without supervision. °· Keep all follow-up visits as told by your health care provider. This is important. °Contact a health care provider if: °· You keep feeling nauseous or you keep vomiting. °· You feel light-headed. °· You develop a rash. °· You have a fever. °Get help right away if: °· You have trouble breathing. °This information is not intended to replace advice given to you by your health care provider. Make sure you discuss any questions you have with your health care provider. °Document Released: 11/04/2012 Document Revised: 06/19/2015 Document Reviewed: 05/06/2015 °Elsevier Interactive Patient Education © 2018 [employer] ° °

## 2017-07-23 NOTE — H&P (Signed)
Chief Complaint: Patient was seen in consultation today for multiple myeloma  Referring Physician(s): Gorsuch,Ni  Supervising Physician: Markus Daft  Patient Status: Bradley Hunt - Out-pt  History of Present Illness: Bradley Hunt is a 47 y.o. male with past medical history of mutliple myeloma diagnosed in May 2017 after found to have bony metastasis.  Patient has been undergoing chemotherapy via Port-A-Cath placed 03/24/17 by Dr. Vernard Gambles.  Patient transitioning to maintenance oral chemotherapy.  No longer has a need for the Port-A-Cath.  He requests removal.   He presents to radiology department today in his usual state of health.  He denies fever, chills, congestion, cough, abdominal pain, dysuria.  Past Medical History:  Diagnosis Date  . Bone metastases ([place])    T spine and L spine  . History of chemotherapy   . History of radiation therapy   . Multiple myeloma not having achieved remission ([place]) 06/25/15  . Numbness    lower extermities bilat     Past Surgical History:  Procedure Laterality Date  . ANTERIOR CRUCIATE LIGAMENT REPAIR    . IR FLUORO GUIDE PORT INSERTION RIGHT  05/16/2016  . IR US GUIDE VASC ACCESS RIGHT  05/16/2016  . LAMINECTOMY N/A 06/25/2015   Procedure: Thoracic six LAMINECTOMY RESECTION FOR TUMOR;  Surgeon: Consuella Lose, MD;  Location: [facility];  Service: Neurosurgery;  Laterality: N/A;    Allergies: Patient has no known allergies.  Medications: Prior to Admission medications   Medication Sig Start Date End Date Taking? Authorizing Provider  acyclovir (ZOVIRAX) 400 MG tablet Take 1 tablet (400 mg total) by mouth 2 (two) times daily. 04/14/17  Yes [name], MD  aspirin EC 81 MG tablet Take 81 mg by mouth daily. 08/29/16  Yes [provider]  Oxycodone HCl 10 MG TABS Take 1 tablet (10 mg total) by mouth every 6 (six) hours as needed. 07/02/17  Yes Gorsuch, Ni, MD  pomalidomide (POMALYST) 2 MG capsule Take 1 capsule (2 mg total) by mouth  daily. Take with water for 21 days on,7 days off. Repeat every 28 days. 07/09/17  Yes Gorsuch, Ni, MD  ibuprofen (ADVIL,MOTRIN) 800 MG tablet Take 800 mg by mouth every 8 (eight) hours as needed (pain).    [provider]  lidocaine-prilocaine (EMLA) cream Apply to affected area once 05/12/17   [name], MD  pantoprazole (PROTONIX) 40 MG tablet Take 40 mg by mouth daily. 12/18/16   [provider]     Family History  Problem Relation Age of Onset  . Cancer Neg Hx     Social History   Socioeconomic History  . Marital status: Single    Spouse name: Not on file  . Number of children: 1  . Years of education: Not on file  . Highest education level: Not on file  Occupational History  . Not on file  Social Needs  . Financial resource strain: Not on file  . Food insecurity:    Worry: Not on file    Inability: Not on file  . Transportation needs:    Medical: Not on file    Non-medical: Not on file  Tobacco Use  . Smoking status: Former Smoker    Packs/day: 1.00    Years: 10.00    Pack years: 10.00    Types: Cigarettes    Last attempt to quit: 01/28/1997    Years since quitting: 20.4  . Smokeless tobacco: Never Used  Substance and Sexual Activity  . Alcohol use: No  .  Drug use: No  . Sexual activity: Yes    Comment: friend Amadou next of kin. 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 Physical activity:    Days per week: Not on file    Minutes per session: Not on file  . Stress: Not on file  Relationships  . Social connections:    Talks on phone: Not on file    Gets together: Not on file    Attends religious service: Not on file    Active member of club or organization: Not on file    Attends meetings of clubs or organizations: Not on file    Relationship status: Not on file  Other Topics Concern  . Not on file  Social History Narrative  . Not on file     Review of Systems: A 12 point ROS discussed and pertinent positives are indicated in the  HPI above.  All other systems are negative.  Review of Systems  Constitutional: Negative for fatigue and fever.  Respiratory: Negative for cough and shortness of breath.   Cardiovascular: Negative for chest pain.  Gastrointestinal: Negative for abdominal pain.  Musculoskeletal: Negative for back pain.  Psychiatric/Behavioral: Negative for behavioral problems and confusion.    Vital Signs: There were no vitals taken for this visit.  Physical Exam  Constitutional: He is oriented to person, place, and time. He appears well-developed.  Cardiovascular: Normal rate, regular rhythm and normal heart sounds.  Pulmonary/Chest: Effort normal and breath sounds normal. No respiratory distress.  Abdominal: Soft.  Neurological: He is alert and oriented to person, place, and time.  Skin: Skin is warm and dry.  Psychiatric: He has a normal mood and affect. His behavior is normal. Judgment and thought content normal.  Nursing note and vitals reviewed.    MD Evaluation Airway: WNL Heart: WNL Abdomen: WNL Chest/ Lungs: WNL ASA  Classification: 2 Mallampati/Airway Score: Two   Imaging: No results found.  Labs:  CBC: Recent Labs    05/26/17 0849 06/09/17 0901 07/07/17 0855 07/23/17 1208  WBC 3.2* 3.7* 3.7* 2.9*  HGB 12.0* 12.0* 13.1 13.7  HCT 34.8* 35.7* 38.7 39.9  PLT 161 174 192 152    COAGS: Recent Labs    07/23/17 1208  INR 1.12    BMP: Recent Labs    05/12/17 1023 05/26/17 0849 06/09/17 0901 07/07/17 0855  NA 140 139 140 139  K 3.7 3.6 3.8 3.8  CL 109 108 108 107  CO2 24 23 26 24   GLUCOSE 109 92 111 107  BUN 11 11 20 22   CALCIUM 9.4 9.1 9.2 9.5  CREATININE 0.86 0.87 1.06 0.95  GFRNONAA >60 >60 >60 >60  GFRAA >60 >60 >60 >60    LIVER FUNCTION TESTS: Recent Labs    05/12/17 1023 05/26/17 0849 06/09/17 0901 07/07/17 0855  BILITOT 0.4 0.6 0.5 0.6  AST 16 21 21 17   ALT 20 28 23 17   ALKPHOS 62 52 54 50  PROT 6.0* 6.0* 6.1* 6.5  ALBUMIN 3.7 3.8 3.9  4.2    TUMOR MARKERS: No results for input(s): AFPTM, CEA, CA199, CHROMGRNA in the last 8760 hours.  Assessment and Plan: Patient with past medical history of multiple myeloma presents status post completion of IV chemotherapy.  IR consulted for Port-A-Cath removal at the request of Dr. Alvy Bimler. Case reviewed by Dr. Anselm Pancoast who approves patient for procedure.  Patient presents today in their usual state of health.  He has been NPO and is not currently on  blood thinners.   Risks and benefits of image guided port-a-catheter placement was discussed with the patient including, but not limited to bleeding, infection, pneumothorax,.  All of the patient's questions were answered, patient is agreeable to proceed. Consent signed and in chart.   Thank you for this interesting consult.  I greatly enjoyed meeting [facility] and look forward to participating in their care.  A copy of this report was sent to the requesting provider on this date.  Electronically Signed: Docia Barrier, PA 07/23/2017, 1:34 PM   I spent a total of  30 Minutes   in face to face in clinical consultation, greater than 50% of which was counseling/coordinating care for multiple myeloma.

## 2017-07-23 NOTE — Procedures (Signed)
Removal of right chest port.  Minimal blood loss and no immediate complication.   

## 2017-08-04 ENCOUNTER — Other Ambulatory Visit: Payer: Self-pay | Admitting: *Deleted

## 2017-08-04 DIAGNOSIS — C9000 Multiple myeloma not having achieved remission: Secondary | ICD-10-CM

## 2017-08-04 MED ORDER — POMALIDOMIDE 2 MG PO CAPS: 2.0000 mg | ORAL_CAPSULE | Freq: Every day | ORAL | 11 refills | Status: DC

## 2017-08-18 ENCOUNTER — Other Ambulatory Visit: Payer: Self-pay | Admitting: *Deleted

## 2017-08-18 MED ORDER — OXYCODONE HCL 10 MG PO TABS: 10.0000 mg | ORAL_TABLET | Freq: Four times a day (QID) | ORAL | 0 refills | Status: DC | PRN

## 2017-08-18 MED FILL — oxyCODONE HCL 10 MG TABS: 10 | 22 days supply | Qty: 90 | Fill #0

## 2017-09-01 ENCOUNTER — Other Ambulatory Visit: Payer: Self-pay | Admitting: *Deleted

## 2017-09-01 DIAGNOSIS — C9000 Multiple myeloma not having achieved remission: Secondary | ICD-10-CM

## 2017-09-01 MED ORDER — POMALIDOMIDE 2 MG PO CAPS: 2.0000 mg | ORAL_CAPSULE | Freq: Every day | ORAL | 11 refills | Status: DC

## 2017-09-15 IMAGING — US IR US GUIDE VASC ACCESS RIGHT
1 series · 1 of 1 positions shown · non-contrast
Comparison: none

ADDENDUM:
Port catheter tip in the mid right atrium.
INDICATION: Myeloma

[Series 1: ir fluoro/shunt/fist · 1 of 1 slices shown]
[im 1/1]
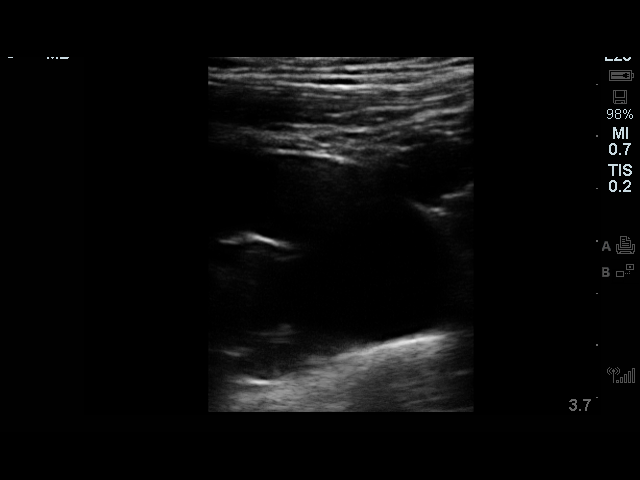

[1 of 1 positions shown; findings below may reference images not displayed]

EXAM:
IMPLANTED PORT A CATH PLACEMENT WITH ULTRASOUND AND FLUOROSCOPIC
GUIDANCE

MEDICATIONS:
Ancef; The antibiotic was administered within an appropriate time
interval prior to skin puncture.

ANESTHESIA/SEDATION:
Moderate (conscious) sedation was employed during this procedure. A
total of Versed 4 mg and Fentanyl 100 mcg was administered
intravenously.

Moderate Sedation Time: 30 minutes. The patient's level of
consciousness and vital signs were monitored continuously by
radiology nursing throughout the procedure under my direct
supervision.

FLUOROSCOPY TIME:  minutes, 42 seconds (4 mGy)

COMPLICATIONS:
None immediate.

PROCEDURE:
The procedure, risks, benefits, and alternatives were explained to
the patient. Questions regarding the procedure were encouraged and
answered. The patient understands and consents to the procedure.

The right neck and chest were prepped with chlorhexidine in a
sterile fashion, and a sterile drape was applied covering the
operative field. Maximum barrier sterile technique with sterile
gowns and gloves were used for the procedure. A timeout was
performed prior to the initiation of the procedure. Local anesthesia
was provided with 1% lidocaine with epinephrine.

After creating a small venotomy incision, a micropuncture kit was
utilized to access the internal jugular vein under direct, real-time
ultrasound guidance. Ultrasound image documentation was performed.
The microwire was kinked to measure appropriate catheter length.

A subcutaneous port pocket was then created along the upper chest
wall utilizing a combination of sharp and blunt dissection. The
pocket was irrigated with sterile saline. A single lumen ISP power
injectable port was chosen for placement. The 8 Fr catheter was
tunneled from the port pocket site to the venotomy incision. The
port was placed in the pocket. It was sewn in place with 2 2-0
Ethilon stitches. The external catheter was trimmed to appropriate
length. At the venotomy, an 8 Fr peel-away sheath was placed over a
guidewire under fluoroscopic guidance. The catheter was then placed
through the sheath and the sheath was removed. Final catheter
positioning was confirmed and documented with a fluoroscopic spot
radiograph. The port was accessed with Eleanore Okeeffe needle, aspirated and
flushed with heparinized saline.

The venotomy site was closed with an interrupted 4-0 Vicryl suture.
The port pocket incision was closed with interrupted 2-0 Vicryl
suture and the skin was opposed with a running subcuticular 4-0
Vicryl suture. Dermabond and Adelfa were applied to both
incisions. Dressings were placed. The patient tolerated the
procedure well without immediate post procedural complication.
IMPRESSION: Successful placement of a right internal jugular approach power
injectable Port-A-Cath. The catheter is ready for immediate use.

## 2017-09-18 ENCOUNTER — Telehealth: Payer: Self-pay

## 2017-09-18 ENCOUNTER — Other Ambulatory Visit: Payer: Self-pay | Admitting: *Deleted

## 2017-09-18 DIAGNOSIS — C9000 Multiple myeloma not having achieved remission: Secondary | ICD-10-CM

## 2017-09-18 MED ORDER — POMALIDOMIDE 2 MG PO CAPS: 2.0000 mg | ORAL_CAPSULE | Freq: Every day | ORAL | 11 refills | Status: DC

## 2017-09-18 NOTE — Telephone Encounter (Signed)
Oral Oncology Patient Advocate Encounter  Due to patient no longer having insurance we applied with [employer] for him to get his Polmalyst at no cost mailed to his home. The patient met me in the lobby to sign the paperwork and give me a copy of his SS benefit.  [employer] approved the application until 44/51/46, at that time [employer] will contact the patient to help him sign up for Medicare Part D.  I called the patient and informed him of this and he verbalized understanding and great appreciation.  [place] Patient [place] Phone [phone] Fax [phone]

## 2017-09-18 NOTE — Telephone Encounter (Signed)
Received notification that patient has patient assistance through [employer]. Prescription sent to [facility] ([place])

## 2017-09-25 NOTE — Telephone Encounter (Signed)
Oral Oncology Patient Advocate Encounter  Patient called to let me know he has received his Polmalyst in the mail.  [place] Patient [place] Phone [phone] Fax [phone]

## 2017-09-30 ENCOUNTER — Inpatient Hospital Stay: Payer: Self-pay | Attending: Hematology and Oncology

## 2017-09-30 DIAGNOSIS — T451X5A Adverse effect of antineoplastic and immunosuppressive drugs, initial encounter: Secondary | ICD-10-CM | POA: Insufficient documentation

## 2017-09-30 DIAGNOSIS — G893 Neoplasm related pain (acute) (chronic): Secondary | ICD-10-CM | POA: Insufficient documentation

## 2017-09-30 DIAGNOSIS — G620 Drug-induced polyneuropathy: Secondary | ICD-10-CM | POA: Insufficient documentation

## 2017-09-30 DIAGNOSIS — C9000 Multiple myeloma not having achieved remission: Secondary | ICD-10-CM | POA: Insufficient documentation

## 2017-09-30 LAB — COMPREHENSIVE METABOLIC PANEL
ALK PHOS: 51 U/L (ref 38–126)
ALT: 17 U/L (ref 0–44)
ANION GAP: 6 (ref 5–15)
AST: 16 U/L (ref 15–41)
Albumin: 3.8 g/dL (ref 3.5–5.0)
BILIRUBIN TOTAL: 0.7 mg/dL (ref 0.3–1.2)
BUN: 14 mg/dL (ref 6–20)
CALCIUM: 9.3 mg/dL (ref 8.9–10.3)
CO2: 28 mmol/L (ref 22–32)
Chloride: 108 mmol/L (ref 98–111)
Creatinine, Ser: 1.02 mg/dL (ref 0.61–1.24)
GFR calc non Af Amer: >60 mL/min (ref >60)
Glucose, Bld: 94 mg/dL (ref 70–99)
Potassium: 4.3 mmol/L (ref 3.5–5.1)
Sodium: 142 mmol/L (ref 135–145)
TOTAL PROTEIN: 6.1 g/dL — AB (ref 6.5–8.1)

## 2017-09-30 LAB — CBC WITH DIFFERENTIAL/PLATELET
BASOS ABS: 0.1 10*3/uL (ref 0.0–0.1)
BASOS PCT: 2 %
Eosinophils Absolute: 0.5 10*3/uL (ref 0.0–0.5)
Eosinophils Relative: 13 %
HEMATOCRIT: 38.3 % — AB (ref 38.4–49.9)
HEMOGLOBIN: 13 g/dL (ref 13.0–17.1)
Lymphocytes Relative: 32 %
Lymphs Abs: 1.3 10*3/uL (ref 0.9–3.3)
MCH: 31.1 pg (ref 27.2–33.4)
MCHC: 33.9 g/dL (ref 32.0–36.0)
MCV: 91.6 fL (ref 79.3–98.0)
MONO ABS: 0.8 10*3/uL (ref 0.1–0.9)
Monocytes Relative: 20 %
NEUTROS ABS: 1.3 10*3/uL — AB (ref 1.5–6.5)
NEUTROS PCT: 33 %
Platelets: 190 10*3/uL (ref 140–400)
RBC: 4.18 MIL/uL — ABNORMAL LOW (ref 4.20–5.82)
RDW: 13.5 % (ref 11.0–14.6)
WBC: 3.9 10*3/uL — ABNORMAL LOW (ref 4.0–10.3)

## 2017-10-01 LAB — KAPPA/LAMBDA LIGHT CHAINS
KAPPA FREE LGHT CHN: 12.9 mg/L (ref 3.3–19.4)
KAPPA, LAMDA LIGHT CHAIN RATIO: 0.89 (ref 0.26–1.65)
LAMDA FREE LIGHT CHAINS: 14.5 mg/L (ref 5.7–26.3)

## 2017-10-02 ENCOUNTER — Telehealth: Payer: Self-pay

## 2017-10-02 NOTE — Telephone Encounter (Signed)
Attempted to contact pt regarding dental clearance for upcoming appt to start Zometa.  No voicemail, no answer.  Msg stating "your call cannot be completed at this time.  Please hang up and try your call again later".

## 2017-10-06 ENCOUNTER — Inpatient Hospital Stay (HOSPITAL_BASED_OUTPATIENT_CLINIC_OR_DEPARTMENT_OTHER): Payer: Self-pay | Admitting: Hematology and Oncology

## 2017-10-06 ENCOUNTER — Encounter: Payer: Self-pay | Admitting: Hematology and Oncology

## 2017-10-06 ENCOUNTER — Inpatient Hospital Stay: Payer: Self-pay

## 2017-10-06 ENCOUNTER — Telehealth: Payer: Self-pay | Admitting: Hematology and Oncology

## 2017-10-06 VITALS — BP 137/89 | HR 64 | Temp 97.9°F | Resp 18 | Ht 74.0 in | Wt 236.0 lb

## 2017-10-06 DIAGNOSIS — T451X5A Adverse effect of antineoplastic and immunosuppressive drugs, initial encounter: Secondary | ICD-10-CM

## 2017-10-06 DIAGNOSIS — G893 Neoplasm related pain (acute) (chronic): Secondary | ICD-10-CM

## 2017-10-06 DIAGNOSIS — G620 Drug-induced polyneuropathy: Secondary | ICD-10-CM

## 2017-10-06 DIAGNOSIS — C9000 Multiple myeloma not having achieved remission: Secondary | ICD-10-CM

## 2017-10-06 MED ORDER — OXYCODONE HCL 10 MG PO TABS: 10.0000 mg | ORAL_TABLET | Freq: Four times a day (QID) | ORAL | 0 refills | Status: DC | PRN

## 2017-10-06 MED ORDER — ACYCLOVIR 400 MG PO TABS
400.0000 mg | ORAL_TABLET | Freq: Two times a day (BID) | ORAL | 11 refills | Status: DC
Start: 2017-10-06 — End: 2018-04-22

## 2017-10-06 MED FILL — oxyCODONE HCL 10 MG TABS: 10 | 23 days supply | Qty: 90 | Fill #0

## 2017-10-06 MED FILL — ACYCLOVIR 400 MG TABLET: 400 | 30 days supply | Qty: 60 | Fill #0

## 2017-10-06 NOTE — Assessment & Plan Note (Signed)
His chronic back pain is stable/improved and he is able to run on a regular basis He will continue the current prescription pain medicine with the plan to wean him off narcotic prescription in a few months

## 2017-10-06 NOTE — Assessment & Plan Note (Signed)
He is doing well and it is not bothering him. 

## 2017-10-06 NOTE — Assessment & Plan Note (Signed)
He will continue Pomalyst single agent Recent myeloma panel showed complete remission He is reminded to take acyclovir for antimicrobial prophylaxis He is reminded to take aspirin for DVT prophylaxis Due to inability to get dental clearance, we will hold off Zometa I recommend resuming primary care doctor's visit for health maintenance issues I will see him every 3 months 

## 2017-10-06 NOTE — Progress Notes (Signed)
[place] OFFICE PROGRESS NOTE  Patient Care Team: [name], MD as PCP - General (Internal Medicine)  ASSESSMENT & PLAN:  Multiple myeloma not having achieved remission [facility]) He will continue Pomalyst single agent Recent myeloma panel showed complete remission He is reminded to take acyclovir for antimicrobial prophylaxis He is reminded to take aspirin for DVT prophylaxis Due to inability to get dental clearance, we will hold off Zometa I recommend resuming primary care doctor's visit for health maintenance issues I will see him every 3 months  Cancer associated pain His chronic back pain is stable/improved and he is able to run on a regular basis He will continue the current prescription pain medicine with the plan to wean him off narcotic prescription in a few months  Peripheral neuropathy due to chemotherapy [facility]) He is doing well and it is not bothering him.   Orders Placed This Encounter  Procedures  . Multiple Myeloma Panel (SPEP&IFE w/QIG)    Standing Status:   Standing    Number of Occurrences:   2    Standing Expiration Date:   10/07/2018  . Beta 2 microglobulin, serum    Standing Status:   Standing    Number of Occurrences:   2    Standing Expiration Date:   10/07/2018    INTERVAL HISTORY: Please see below for problem oriented charting. He returns for further follow-up He is doing well He is able to exercise and run on a regular basis His chronic back pain is stable/improved He was not able to get dental clearance for Zometa No recent infection, fever or chills He is compliant taking Pomalyst as scheduled  SUMMARY OF ONCOLOGIC HISTORY:   Multiple myeloma not having achieved remission ([place])   06/23/2015 - 06/28/2015 Hospital Admission    The patient was admitted to the hospital due to gait ataxia and back pain. He was subsequently found to have cord compression underwent surgery and was discharged home    06/24/2015 Imaging    Abnormal  appearance of the T6 vertebral body, highly suspicious for possible osseous metastasis. Associated pathologic fracture withup to 30% height loss. There is associated abnormal soft tissue density within the ventral epidural space,    06/24/2015 Imaging    MRI lumbar: Focal osseous lesion with abnormal enhancement involving the right pedicle of L3, suspicious for possible osseous metastasisgiven the findings in the thoracic spine. Question additional focal lesion within the right iliac wing as above.      06/25/2015 Pathology Results    Accession: [id number] bone biopsy come from plasma cell neoplasm.    06/25/2015 Surgery    He had T6 laminectomy, bilateral transpedicular approach for resection of tumor, decompression of thecal sac and microdissection    07/20/2015 Bone Marrow Biopsy    BM biopsy showed 50% involvement; Cytogenetics 46XY, positive for 13q-    07/31/2015 - 11/03/2015 Chemotherapy    He received Velcade, Revlimid and Dex. Zometa is not given due to inability to get dental clearance    12/14/2015 - 04/24/2016 Chemotherapy    He is started on maintenance treatment with Revlimid only    12/18/2015 Imaging    MRI thoracic and lumbar spine showed numerous enhancing foci throughout the thoracic and lumbar spine with several new small foci in the lumbar spine in comparison with prior MRI compatible with metastatic disease. Stable loss of height of the T3, T4, and T6 vertebral bodies and new postsurgical changes related to T6 laminectomy. No significant epidural disease or  evidence for cord compression. No abnormal enhancement of the spinal cord or cauda equina.    05/02/2016 Bone Marrow Biopsy    Outside bone marrow biopsy showed 20% myeloma involvement    05/16/2016 Procedure    Successful placement of a right internal jugular approach power injectable Port-A-Cath. The catheter is ready for immediate use.    05/21/2016 - 07/03/2016 Chemotherapy    He received Kyprolis, Cytoxan and  dexamethasone     07/08/2016 Procedure    Status post CT-guided bone marrow biopsy, with tissue specimen sent to pathology for complete histopathologic analysis    07/08/2016 Bone Marrow Biopsy    Bone Marrow, Aspirate,Biopsy, and Clot BONE MARROW: - MILDLY HYPERCELLULAR MARROW (60%) WITH PLASMA CELL NEOPLASM - SEE COMMENT PERIPHERAL BLOOD: - NORMOCYTIC ANEMIA Diagnosis Note The marrow is hypercellular with lambda-restricted plasma cells consistent with persistence of the patient's previously diagnosed plasma cell neoplasm. The plasma cells comprise approximately 10-15% of the total marrow cellularity, are enlarged, and arranged in clusters.    08/14/2016 Miscellaneous    He received conditioning treatment with melphalan    08/15/2016 Bone Marrow Transplant    He received autologous stem cell transplant    08/24/2016 - 08/29/2016 Hospital Admission    His post-transplant course was complicated by E-Coli bacteremia    11/26/2016 PET scan    PET [facility] at [facility] 1. Technically limited study due to soft tissue uptake. 2. New hypermetabolic uptake at C7 spinous process and left proximal femur that is of questionable significance in absence of underlying CT correlate. Further assessment with whole body bone scan may be considered. 2. Redemonstrated nonhypermetabolic multifocal lucent and sclerotic lesions throughout the spine which are similar to prior.     12/02/2016 Bone Marrow Biopsy    He had repeat bone marrow biopsy at [facility] An immunohistochemical stain for CD138 is performed on the bone marrow core biopsy demonstrates increased plasma cells with focal clustering (10-20% overall), which are monotypic for lambda light chain by in situ hybridization.    01/06/2017 - 06/09/2017 Chemotherapy    He received weekly Dexamethasone, Pomalyst days 1-21 and Daratumumab. From 06/08/17 onwards, he is placed on maintenance Pomalyst only    07/23/2017 Procedure    Successful right IJ vein Port-A-Cath  explant.     REVIEW OF SYSTEMS:   Constitutional: Denies fevers, chills or abnormal weight loss Eyes: Denies blurriness of vision Ears, nose, mouth, throat, and face: Denies mucositis or sore throat Respiratory: Denies cough, dyspnea or wheezes Cardiovascular: Denies palpitation, chest discomfort or lower extremity swelling Gastrointestinal:  Denies nausea, heartburn or change in bowel habits Skin: Denies abnormal skin rashes Lymphatics: Denies new lymphadenopathy or easy bruising Neurological:Denies numbness, tingling or new weaknesses Behavioral/Psych: Mood is stable, no new changes  All other systems were reviewed with the patient and are negative.  I have reviewed the past medical history, past surgical history, social history and family history with the patient and they are unchanged from previous note.  ALLERGIES:  has No Known Allergies.  MEDICATIONS:  Current Outpatient Medications  Medication Sig Dispense Refill  . acyclovir (ZOVIRAX) 400 MG tablet Take 1 tablet (400 mg total) by mouth 2 (two) times daily. 60 tablet 11  . aspirin EC 81 MG tablet Take 81 mg by mouth daily.    Marland Kitchen ibuprofen (ADVIL,MOTRIN) 800 MG tablet Take 800 mg by mouth every 8 (eight) hours as needed (pain).    Marland Kitchen lidocaine-prilocaine (EMLA) cream Apply to affected area once 30 g 3  .  Oxycodone HCl 10 MG TABS Take 1 tablet (10 mg total) by mouth every 6 (six) hours as needed. 90 tablet 0  . pantoprazole (PROTONIX) 40 MG tablet Take 40 mg by mouth daily.  0  . pomalidomide (POMALYST) 2 MG capsule Take 1 capsule (2 mg total) by mouth daily. Take with water for 21 days on,7 days off. Repeat every 28 days. 21 capsule 11   No current facility-administered medications for this visit.     PHYSICAL EXAMINATION: ECOG PERFORMANCE STATUS: 1 - Symptomatic but completely ambulatory  Vitals:   10/06/17 0855  BP: 137/89  Pulse: 64  Resp: 18  Temp: 97.9 F (36.6 C)  SpO2: 100%   Filed Weights   10/06/17 0855   Weight: 236 lb (107 kg)    GENERAL:alert, no distress and comfortable SKIN: skin color, texture, turgor are normal, no rashes or significant lesions EYES: normal, Conjunctiva are pink and non-injected, sclera clear OROPHARYNX:no exudate, no erythema and lips, buccal mucosa, and tongue normal  NECK: supple, thyroid normal size, non-tender, without nodularity LYMPH:  no palpable lymphadenopathy in the cervical, axillary or inguinal LUNGS: clear to auscultation and percussion with normal breathing effort HEART: regular rate & rhythm and no murmurs and no lower extremity edema ABDOMEN:abdomen soft, non-tender and normal bowel sounds Musculoskeletal:no cyanosis of digits and no clubbing  NEURO: alert & oriented x 3 with fluent speech, no focal motor/sensory deficits  LABORATORY DATA:  I have reviewed the data as listed    Component Value Date/Time   NA 142 09/30/2017 0816   NA 139 01/27/2017 0809   K 4.3 09/30/2017 0816   K 4.1 01/27/2017 0809   CL 108 09/30/2017 0816   CO2 28 09/30/2017 0816   CO2 23 01/27/2017 0809   GLUCOSE 94 09/30/2017 0816   GLUCOSE 122 01/27/2017 0809   BUN 14 09/30/2017 0816   BUN 14.3 01/27/2017 0809   CREATININE 1.02 09/30/2017 0816   CREATININE 0.9 01/27/2017 0809   CALCIUM 9.3 09/30/2017 0816   CALCIUM 9.1 01/27/2017 0809   PROT 6.1 (L) 09/30/2017 0816   PROT 6.2 (L) 01/27/2017 0809   ALBUMIN 3.8 09/30/2017 0816   ALBUMIN 3.7 01/27/2017 0809   AST 16 09/30/2017 0816   AST 10 01/27/2017 0809   ALT 17 09/30/2017 0816   ALT 18 01/27/2017 0809   ALKPHOS 51 09/30/2017 0816   ALKPHOS 42 01/27/2017 0809   BILITOT 0.7 09/30/2017 0816   BILITOT 0.72 01/27/2017 0809   GFRNONAA >60 09/30/2017 0816   GFRAA >60 09/30/2017 0816    No results found for: SPEP, UPEP  Lab Results  Component Value Date   WBC 3.9 (L) 09/30/2017   NEUTROABS 1.3 (L) 09/30/2017   HGB 13.0 09/30/2017   HCT 38.3 (L) 09/30/2017   MCV 91.6 09/30/2017   PLT 190 09/30/2017       Chemistry      Component Value Date/Time   NA 142 09/30/2017 0816   NA 139 01/27/2017 0809   K 4.3 09/30/2017 0816   K 4.1 01/27/2017 0809   CL 108 09/30/2017 0816   CO2 28 09/30/2017 0816   CO2 23 01/27/2017 0809   BUN 14 09/30/2017 0816   BUN 14.3 01/27/2017 0809   CREATININE 1.02 09/30/2017 0816   CREATININE 0.9 01/27/2017 0809      Component Value Date/Time   CALCIUM 9.3 09/30/2017 0816   CALCIUM 9.1 01/27/2017 0809   ALKPHOS 51 09/30/2017 0816   ALKPHOS 42 01/27/2017 0809  AST 16 09/30/2017 0816   AST 10 01/27/2017 0809   ALT 17 09/30/2017 0816   ALT 18 01/27/2017 0809   BILITOT 0.7 09/30/2017 0816   BILITOT 0.72 01/27/2017 0809     All questions were answered. The patient knows to call the clinic with any problems, questions or concerns. No barriers to learning was detected.  I spent 15 minutes counseling the patient face to face. The total time spent in the appointment was 20 minutes and more than 50% was on counseling and review of test results  Bradley Lark, MD 10/06/2017 4:10 PM

## 2017-10-06 NOTE — Telephone Encounter (Signed)
Gave avs and calendar ° °

## 2017-10-15 ENCOUNTER — Other Ambulatory Visit: Payer: Self-pay

## 2017-10-15 DIAGNOSIS — C9000 Multiple myeloma not having achieved remission: Secondary | ICD-10-CM

## 2017-10-15 MED ORDER — POMALIDOMIDE 2 MG PO CAPS: 2.0000 mg | ORAL_CAPSULE | Freq: Every day | ORAL | 11 refills | Status: DC

## 2017-10-20 ENCOUNTER — Telehealth: Payer: Self-pay

## 2017-10-20 NOTE — Telephone Encounter (Signed)
[employer] called stating that there was a restriction placed on pt's pomalyst authorization d/t his responses to survey questions - when asked if he has donated blood or sperm since starting medication pt answered "yes". This RN was able to speak with pt by phone to verify his response.  Pt states this is incorrect and that he has not donated blood or sperm since taking medication.  [employer] has been notified and restriction has been lifted.  Pharmacy was contacted and confirmed that they are sending his pomalyst out today for mail delivery.

## 2017-11-05 ENCOUNTER — Other Ambulatory Visit: Payer: Self-pay

## 2017-11-05 DIAGNOSIS — C9000 Multiple myeloma not having achieved remission: Secondary | ICD-10-CM

## 2017-11-05 MED ORDER — POMALIDOMIDE 2 MG PO CAPS: 2.0000 mg | ORAL_CAPSULE | Freq: Every day | ORAL | 11 refills | Status: DC

## 2017-11-07 ENCOUNTER — Other Ambulatory Visit: Payer: Self-pay | Admitting: Hematology and Oncology

## 2017-11-07 ENCOUNTER — Telehealth: Payer: Self-pay | Admitting: *Deleted

## 2017-11-07 MED ORDER — OXYCODONE HCL 10 MG PO TABS: 10.0000 mg | ORAL_TABLET | Freq: Four times a day (QID) | ORAL | 0 refills | Status: DC | PRN

## 2017-11-07 MED FILL — oxyCODONE HCL 10 MG TABS: 10 | 22 days supply | Qty: 90 | Fill #0

## 2017-11-07 NOTE — Telephone Encounter (Signed)
Discussed in great detail about driving and the use of narcotic medication. Patient states he has not returned to work. He states it would not be until mid to late November when he would start driving again. He would like the refill of Oxycodone this last time with the understanding that he is not to drive/work until he has weaned himself off.

## 2017-11-07 NOTE — Telephone Encounter (Signed)
Patient called requesting refill on his Oxycodone 10mg  tablets. He would like to pick it up this afternoon. Please advise.

## 2017-11-07 NOTE — Telephone Encounter (Signed)
We discussed risk of him remaining on oxycodone and driving He might not be able to work because of the pain medication Alternative would be to try non narcotic option of tramadol Let me know what he thinks

## 2017-11-10 MED FILL — ACYCLOVIR 400 MG TABLET: 400 | 30 days supply | Qty: 60 | Fill #1

## 2017-11-25 ENCOUNTER — Other Ambulatory Visit: Payer: Self-pay

## 2017-11-25 DIAGNOSIS — C9000 Multiple myeloma not having achieved remission: Secondary | ICD-10-CM

## 2017-11-25 MED ORDER — POMALIDOMIDE 2 MG PO CAPS: 2.0000 mg | ORAL_CAPSULE | Freq: Every day | ORAL | 11 refills | Status: DC

## 2017-12-15 MED FILL — ACYCLOVIR 400 MG TABLET: 400 | 30 days supply | Qty: 60 | Fill #2

## 2017-12-17 ENCOUNTER — Telehealth: Payer: Self-pay

## 2017-12-17 NOTE — Telephone Encounter (Signed)
Oral Oncology Patient Advocate Encounter  It is time for Pomalyst to be renewed for 2020 with [employer]. The patient came in and signed the application.   [employer] renewal application for Pomalyst was faxed 12/17/17.  This encounter will updated until final determination  [place] Patient [place] Phone [phone] Fax [phone]

## 2017-12-29 ENCOUNTER — Inpatient Hospital Stay: Payer: Medicare Other | Attending: Hematology and Oncology

## 2017-12-29 DIAGNOSIS — Z79899 Other long term (current) drug therapy: Secondary | ICD-10-CM | POA: Diagnosis not present

## 2017-12-29 DIAGNOSIS — Z9221 Personal history of antineoplastic chemotherapy: Secondary | ICD-10-CM | POA: Insufficient documentation

## 2017-12-29 DIAGNOSIS — C9000 Multiple myeloma not having achieved remission: Secondary | ICD-10-CM | POA: Diagnosis not present

## 2017-12-29 DIAGNOSIS — Z7982 Long term (current) use of aspirin: Secondary | ICD-10-CM | POA: Insufficient documentation

## 2017-12-29 LAB — COMPREHENSIVE METABOLIC PANEL
ALBUMIN: 3.9 g/dL (ref 3.5–5.0)
ALK PHOS: 56 U/L (ref 38–126)
ALT: 13 U/L (ref 0–44)
AST: 12 U/L — ABNORMAL LOW (ref 15–41)
Anion gap: 10 (ref 5–15)
BILIRUBIN TOTAL: 0.4 mg/dL (ref 0.3–1.2)
BUN: 15 mg/dL (ref 6–20)
CALCIUM: 9.4 mg/dL (ref 8.9–10.3)
CO2: 25 mmol/L (ref 22–32)
CREATININE: 1.09 mg/dL (ref 0.61–1.24)
Chloride: 106 mmol/L (ref 98–111)
GFR calc Af Amer: >60 mL/min (ref >60)
GFR calc non Af Amer: >60 mL/min (ref >60)
GLUCOSE: 102 mg/dL — AB (ref 70–99)
Potassium: 4.2 mmol/L (ref 3.5–5.1)
Sodium: 141 mmol/L (ref 135–145)
TOTAL PROTEIN: 7.1 g/dL (ref 6.5–8.1)

## 2017-12-29 LAB — CBC WITH DIFFERENTIAL/PLATELET
ABS IMMATURE GRANULOCYTES: 0 10*3/uL (ref 0.00–0.07)
BASOS ABS: 0.1 10*3/uL (ref 0.0–0.1)
Basophils Relative: 1 %
Eosinophils Absolute: 0.5 10*3/uL (ref 0.0–0.5)
Eosinophils Relative: 9 %
HEMATOCRIT: 39.8 % (ref 39.0–52.0)
HEMOGLOBIN: 13.6 g/dL (ref 13.0–17.0)
Immature Granulocytes: 0 %
LYMPHS ABS: 2.1 10*3/uL (ref 0.7–4.0)
LYMPHS PCT: 34 %
MCH: 30.9 pg (ref 26.0–34.0)
MCHC: 34.2 g/dL (ref 30.0–36.0)
MCV: 90.5 fL (ref 80.0–100.0)
MONO ABS: 0.7 10*3/uL (ref 0.1–1.0)
Monocytes Relative: 11 %
NEUTROS ABS: 2.7 10*3/uL (ref 1.7–7.7)
Neutrophils Relative %: 45 %
Platelets: 200 10*3/uL (ref 150–400)
RBC: 4.4 MIL/uL (ref 4.22–5.81)
RDW: 12.3 % (ref 11.5–15.5)
WBC: 6 10*3/uL (ref 4.0–10.5)
nRBC: 0 % (ref 0.0–0.2)

## 2017-12-30 LAB — BETA 2 MICROGLOBULIN, SERUM: Beta-2 Microglobulin: 1.5 mg/L (ref 0.6–2.4)

## 2017-12-30 LAB — KAPPA/LAMBDA LIGHT CHAINS
Kappa free light chain: 20.4 mg/L — ABNORMAL HIGH (ref 3.3–19.4)
Kappa, lambda light chain ratio: 1.04 (ref 0.26–1.65)
Lambda free light chains: 19.7 mg/L (ref 5.7–26.3)

## 2017-12-31 LAB — MULTIPLE MYELOMA PANEL, SERUM
ALBUMIN/GLOB SERPL: 1.3 (ref 0.7–1.7)
ALPHA 1: 0.2 g/dL (ref 0.0–0.4)
Albumin SerPl Elph-Mcnc: 3.8 g/dL (ref 2.9–4.4)
Alpha2 Glob SerPl Elph-Mcnc: 0.7 g/dL (ref 0.4–1.0)
B-Globulin SerPl Elph-Mcnc: 0.9 g/dL (ref 0.7–1.3)
GAMMA GLOB SERPL ELPH-MCNC: 1.1 g/dL (ref 0.4–1.8)
GLOBULIN, TOTAL: 3 g/dL (ref 2.2–3.9)
IGA: 41 mg/dL — AB (ref 90–386)
IgG (Immunoglobin G), Serum: 1233 mg/dL (ref 700–1600)
IgM (Immunoglobulin M), Srm: 32 mg/dL (ref 20–172)
Total Protein ELP: 6.8 g/dL (ref 6.0–8.5)

## 2018-01-02 NOTE — Assessment & Plan Note (Deleted)
He is doing well and it is not bothering him.

## 2018-01-02 NOTE — Assessment & Plan Note (Deleted)
He will continue Pomalyst single agent Recent myeloma panel showed complete remission He is reminded to take acyclovir for antimicrobial prophylaxis He is reminded to take aspirin for DVT prophylaxis Due to inability to get dental clearance, we will hold off Zometa I recommend resuming primary care doctor's visit for health maintenance issues I will see him every 3 months 

## 2018-01-05 ENCOUNTER — Telehealth: Payer: Self-pay

## 2018-01-05 ENCOUNTER — Telehealth: Payer: Self-pay | Admitting: Hematology and Oncology

## 2018-01-05 ENCOUNTER — Inpatient Hospital Stay: Payer: Medicare Other | Admitting: Hematology and Oncology

## 2018-01-05 ENCOUNTER — Other Ambulatory Visit: Payer: Self-pay

## 2018-01-05 DIAGNOSIS — C9000 Multiple myeloma not having achieved remission: Secondary | ICD-10-CM

## 2018-01-05 MED ORDER — POMALIDOMIDE 2 MG PO CAPS: 2.0000 mg | ORAL_CAPSULE | Freq: Every day | ORAL | 11 refills | Status: DC

## 2018-01-05 NOTE — Telephone Encounter (Signed)
patient came in to reschedule

## 2018-01-05 NOTE — Telephone Encounter (Signed)
Oral Oncology Patient Advocate Encounter  I received a notification from [employer] that Bradley Hunt is eligible for Medicare and they sent him an application in the mail. I called Bradley Hunt to follow up on this. He did receive the application, filled it out and sent it in. He got a letter saying he was approved but he is not sure of the approval date. I checked in our eligibility system and it is not active yet, it will likely be active 01-28-18, I will continue to keep an eye on it. I told him I would let him know when I had more information about his medicare.  Bradley Hunt verbalized understanding and appreciation  Margy Clarks [facility] Patient [name] Phone [phone] Fax [phone]

## 2018-01-05 NOTE — Telephone Encounter (Signed)
He called and left a message asking Rx for Pomalyst Rx sent to Pharmacy.  Called back after Rx sent. No answer.

## 2018-01-06 NOTE — Telephone Encounter (Signed)
He called back. Gave below message. He verbalized understanding.

## 2018-01-18 MED FILL — ACYCLOVIR 400 MG TABLET: 400 | 30 days supply | Qty: 60 | Fill #3

## 2018-01-19 ENCOUNTER — Encounter: Payer: Self-pay | Admitting: Hematology and Oncology

## 2018-01-19 ENCOUNTER — Inpatient Hospital Stay (HOSPITAL_BASED_OUTPATIENT_CLINIC_OR_DEPARTMENT_OTHER): Payer: Medicare Other | Admitting: Hematology and Oncology

## 2018-01-19 ENCOUNTER — Telehealth: Payer: Self-pay | Admitting: Hematology and Oncology

## 2018-01-19 DIAGNOSIS — M549 Dorsalgia, unspecified: Secondary | ICD-10-CM

## 2018-01-19 DIAGNOSIS — Z9221 Personal history of antineoplastic chemotherapy: Secondary | ICD-10-CM

## 2018-01-19 DIAGNOSIS — C9000 Multiple myeloma not having achieved remission: Secondary | ICD-10-CM

## 2018-01-19 DIAGNOSIS — G8929 Other chronic pain: Secondary | ICD-10-CM

## 2018-01-19 DIAGNOSIS — Z79899 Other long term (current) drug therapy: Secondary | ICD-10-CM

## 2018-01-19 DIAGNOSIS — Z7982 Long term (current) use of aspirin: Secondary | ICD-10-CM

## 2018-01-19 MED ORDER — OXYCODONE HCL 10 MG PO TABS: 10.0000 mg | ORAL_TABLET | Freq: Four times a day (QID) | ORAL | 0 refills | Status: DC | PRN

## 2018-01-19 MED FILL — oxyCODONE HCL 10 MG TABS: 10 | 22 days supply | Qty: 90 | Fill #0

## 2018-01-19 NOTE — Progress Notes (Signed)
[place] OFFICE PROGRESS NOTE  Patient Care Team: [name], MD as PCP - General (Internal Medicine)  ASSESSMENT & PLAN:  Multiple myeloma not having achieved remission [facility]) He will continue Pomalyst single agent Recent myeloma panel showed complete remission He is reminded to take acyclovir for antimicrobial prophylaxis He is reminded to take aspirin for DVT prophylaxis Due to inability to get dental clearance, we will hold off Zometa I recommend resuming primary care doctor's visit for health maintenance issues I will see him every 3 months  Chronic back pain greater than 3 months duration He has chronic back pain secondary to compression fracture in the past. The patient requested oxycodone prescription. In the past, I have told the patient I do not intend to prescribe oxycodone long-term. I recommend Tylenol as needed and physical therapy along with weight loss strategy. I will refill his prescription oxycodone only 1 more time today   No orders of the defined types were placed in this encounter.   INTERVAL HISTORY: Please see below for problem oriented charting. He returns for further follow-up He missed his appointment recently He is back to work full-time. He has gained a lot of weight since last time I saw him due to lack of physical activity. He has not seen the dentist to obtain dental clearance. He stated he is taking all his medication as prescribed and requested refill for prescription pain medicine  SUMMARY OF ONCOLOGIC HISTORY:   Multiple myeloma not having achieved remission ([place])   06/23/2015 - 06/28/2015 Hospital Admission    The patient was admitted to the hospital due to gait ataxia and back pain. He was subsequently found to have cord compression underwent surgery and was discharged home    06/24/2015 Imaging    Abnormal appearance of the T6 vertebral body, highly suspicious for possible osseous metastasis. Associated pathologic  fracture withup to 30% height loss. There is associated abnormal soft tissue density within the ventral epidural space,    06/24/2015 Imaging    MRI lumbar: Focal osseous lesion with abnormal enhancement involving the right pedicle of L3, suspicious for possible osseous metastasisgiven the findings in the thoracic spine. Question additional focal lesion within the right iliac wing as above.      06/25/2015 Pathology Results    Accession: [id number] bone biopsy come from plasma cell neoplasm.    06/25/2015 Surgery    He had T6 laminectomy, bilateral transpedicular approach for resection of tumor, decompression of thecal sac and microdissection    07/20/2015 Bone Marrow Biopsy    BM biopsy showed 50% involvement; Cytogenetics 46XY, positive for 13q-    07/31/2015 - 11/03/2015 Chemotherapy    He received Velcade, Revlimid and Dex. Zometa is not given due to inability to get dental clearance    12/14/2015 - 04/24/2016 Chemotherapy    He is started on maintenance treatment with Revlimid only    12/18/2015 Imaging    MRI thoracic and lumbar spine showed numerous enhancing foci throughout the thoracic and lumbar spine with several new small foci in the lumbar spine in comparison with prior MRI compatible with metastatic disease. Stable loss of height of the T3, T4, and T6 vertebral bodies and new postsurgical changes related to T6 laminectomy. No significant epidural disease or evidence for cord compression. No abnormal enhancement of the spinal cord or cauda equina.    05/02/2016 Bone Marrow Biopsy    Outside bone marrow biopsy showed 20% myeloma involvement    05/16/2016 Procedure  Successful placement of a right internal jugular approach power injectable Port-A-Cath. The catheter is ready for immediate use.    05/21/2016 - 07/03/2016 Chemotherapy    He received Kyprolis, Cytoxan and dexamethasone     07/08/2016 Procedure    Status post CT-guided bone marrow biopsy, with tissue specimen sent to  pathology for complete histopathologic analysis    07/08/2016 Bone Marrow Biopsy    Bone Marrow, Aspirate,Biopsy, and Clot BONE MARROW: - MILDLY HYPERCELLULAR MARROW (60%) WITH PLASMA CELL NEOPLASM - SEE COMMENT PERIPHERAL BLOOD: - NORMOCYTIC ANEMIA Diagnosis Note The marrow is hypercellular with lambda-restricted plasma cells consistent with persistence of the patient's previously diagnosed plasma cell neoplasm. The plasma cells comprise approximately 10-15% of the total marrow cellularity, are enlarged, and arranged in clusters.    08/14/2016 Miscellaneous    He received conditioning treatment with melphalan    08/15/2016 Bone Marrow Transplant    He received autologous stem cell transplant    08/24/2016 - 08/29/2016 Hospital Admission    His post-transplant course was complicated by E-Coli bacteremia    11/26/2016 PET scan    PET CT at [facility] 1. Technically limited study due to soft tissue uptake. 2. New hypermetabolic uptake at C7 spinous process and left proximal femur that is of questionable significance in absence of underlying CT correlate. Further assessment with whole body bone scan may be considered. 2. Redemonstrated nonhypermetabolic multifocal lucent and sclerotic lesions throughout the spine which are similar to prior.     12/02/2016 Bone Marrow Biopsy    He had repeat bone marrow biopsy at [facility] An immunohistochemical stain for CD138 is performed on the bone marrow core biopsy demonstrates increased plasma cells with focal clustering (10-20% overall), which are monotypic for lambda light chain by in situ hybridization.    01/06/2017 - 06/09/2017 Chemotherapy    He received weekly Dexamethasone, Pomalyst days 1-21 and Daratumumab. From 06/08/17 onwards, he is placed on maintenance Pomalyst only    07/23/2017 Procedure    Successful right IJ vein Port-A-Cath explant.     REVIEW OF SYSTEMS:   Constitutional: Denies fevers, chills or abnormal weight loss Eyes: Denies  blurriness of vision Ears, nose, mouth, throat, and face: Denies mucositis or sore throat Respiratory: Denies cough, dyspnea or wheezes Cardiovascular: Denies palpitation, chest discomfort or lower extremity swelling Gastrointestinal:  Denies nausea, heartburn or change in bowel habits Skin: Denies abnormal skin rashes Lymphatics: Denies new lymphadenopathy or easy bruising Neurological:Denies numbness, tingling or new weaknesses Behavioral/Psych: Mood is stable, no new changes  All other systems were reviewed with the patient and are negative.  I have reviewed the past medical history, past surgical history, social history and family history with the patient and they are unchanged from previous note.  ALLERGIES:  has No Known Allergies.  MEDICATIONS:  Current Outpatient Medications  Medication Sig Dispense Refill  . acyclovir (ZOVIRAX) 400 MG tablet Take 1 tablet (400 mg total) by mouth 2 (two) times daily. 60 tablet 11  . aspirin EC 81 MG tablet Take 81 mg by mouth daily.    Marland Kitchen ibuprofen (ADVIL,MOTRIN) 800 MG tablet Take 800 mg by mouth every 8 (eight) hours as needed (pain).    Marland Kitchen lidocaine-prilocaine (EMLA) cream Apply to affected area once 30 g 3  . Oxycodone HCl 10 MG TABS Take 1 tablet (10 mg total) by mouth every 6 (six) hours as needed. 90 tablet 0  . pantoprazole (PROTONIX) 40 MG tablet Take 40 mg by mouth daily.  0  . pomalidomide (  POMALYST) 2 MG capsule Take 1 capsule (2 mg total) by mouth daily. Take with water for 21 days on,7 days off. Repeat every 28 days. 21 capsule 11   No current facility-administered medications for this visit.     PHYSICAL EXAMINATION: ECOG PERFORMANCE STATUS: 1 - Symptomatic but completely ambulatory  Vitals:   01/19/18 0849  BP: 139/87  Pulse: 85  Resp: 18  Temp: 98.1 F (36.7 C)  SpO2: 100%   Filed Weights   01/19/18 0849  Weight: 244 lb 6.4 oz (110.9 kg)    GENERAL:alert, no distress and comfortable SKIN: skin color, texture,  turgor are normal, no rashes or significant lesions EYES: normal, Conjunctiva are pink and non-injected, sclera clear OROPHARYNX:no exudate, no erythema and lips, buccal mucosa, and tongue normal  NECK: supple, thyroid normal size, non-tender, without nodularity LYMPH:  no palpable lymphadenopathy in the cervical, axillary or inguinal LUNGS: clear to auscultation and percussion with normal breathing effort HEART: regular rate & rhythm and no murmurs and no lower extremity edema ABDOMEN:abdomen soft, non-tender and normal bowel sounds Musculoskeletal:no cyanosis of digits and no clubbing  NEURO: alert & oriented x 3 with fluent speech, no focal motor/sensory deficits  LABORATORY DATA:  I have reviewed the data as listed    Component Value Date/Time   NA 141 12/29/2017 0813   NA 139 01/27/2017 0809   K 4.2 12/29/2017 0813   K 4.1 01/27/2017 0809   CL 106 12/29/2017 0813   CO2 25 12/29/2017 0813   CO2 23 01/27/2017 0809   GLUCOSE 102 (H) 12/29/2017 0813   GLUCOSE 122 01/27/2017 0809   BUN 15 12/29/2017 0813   BUN 14.3 01/27/2017 0809   CREATININE 1.09 12/29/2017 0813   CREATININE 0.9 01/27/2017 0809   CALCIUM 9.4 12/29/2017 0813   CALCIUM 9.1 01/27/2017 0809   PROT 7.1 12/29/2017 0813   PROT 6.2 (L) 01/27/2017 0809   ALBUMIN 3.9 12/29/2017 0813   ALBUMIN 3.7 01/27/2017 0809   AST 12 (L) 12/29/2017 0813   AST 10 01/27/2017 0809   ALT 13 12/29/2017 0813   ALT 18 01/27/2017 0809   ALKPHOS 56 12/29/2017 0813   ALKPHOS 42 01/27/2017 0809   BILITOT 0.4 12/29/2017 0813   BILITOT 0.72 01/27/2017 0809   GFRNONAA >60 12/29/2017 0813   GFRAA >60 12/29/2017 0813    No results found for: SPEP, UPEP  Lab Results  Component Value Date   WBC 6.0 12/29/2017   NEUTROABS 2.7 12/29/2017   HGB 13.6 12/29/2017   HCT 39.8 12/29/2017   MCV 90.5 12/29/2017   PLT 200 12/29/2017      Chemistry      Component Value Date/Time   NA 141 12/29/2017 0813   NA 139 01/27/2017 0809   K 4.2  12/29/2017 0813   K 4.1 01/27/2017 0809   CL 106 12/29/2017 0813   CO2 25 12/29/2017 0813   CO2 23 01/27/2017 0809   BUN 15 12/29/2017 0813   BUN 14.3 01/27/2017 0809   CREATININE 1.09 12/29/2017 0813   CREATININE 0.9 01/27/2017 0809      Component Value Date/Time   CALCIUM 9.4 12/29/2017 0813   CALCIUM 9.1 01/27/2017 0809   ALKPHOS 56 12/29/2017 0813   ALKPHOS 42 01/27/2017 0809   AST 12 (L) 12/29/2017 0813   AST 10 01/27/2017 0809   ALT 13 12/29/2017 0813   ALT 18 01/27/2017 0809   BILITOT 0.4 12/29/2017 0813   BILITOT 0.72 01/27/2017 0809  All questions were answered. The patient knows to call the clinic with any problems, questions or concerns. No barriers to learning was detected.  I spent 15 minutes counseling the patient face to face. The total time spent in the appointment was 20 minutes and more than 50% was on counseling and review of test results  Bradley Lark, MD 01/19/2018 10:52 AM

## 2018-01-19 NOTE — Assessment & Plan Note (Signed)
He will continue Pomalyst single agent Recent myeloma panel showed complete remission He is reminded to take acyclovir for antimicrobial prophylaxis He is reminded to take aspirin for DVT prophylaxis Due to inability to get dental clearance, we will hold off Zometa I recommend resuming primary care doctor's visit for health maintenance issues I will see him every 3 months 

## 2018-01-19 NOTE — Assessment & Plan Note (Signed)
He has chronic back pain secondary to compression fracture in the past. The patient requested oxycodone prescription. In the past, I have told the patient I do not intend to prescribe oxycodone long-term. I recommend Tylenol as needed and physical therapy along with weight loss strategy. I will refill his prescription oxycodone only 1 more time today

## 2018-01-19 NOTE — Telephone Encounter (Signed)
Gave avs and calendar ° °

## 2018-02-03 ENCOUNTER — Encounter: Payer: Self-pay | Admitting: Hematology and Oncology

## 2018-02-04 NOTE — Telephone Encounter (Signed)
Oral Oncology Patient Advocate Encounter  I followed up with Mr. Desai because I have not been able to find prescription coverage for him. He stated that Medicare sent him a letter in December stating that is was in processing. Mr. Brallier is going to call and get on the status and let me know.  [employer] also emailed me asking for information on insurance. I let them know that there was no insurance coverage coming up in eligibility check. I also let them know I would call and follow up with the patient so I will keep [employer] updated.  [place] Patient [place] Phone [phone] Fax [phone]

## 2018-02-05 ENCOUNTER — Other Ambulatory Visit: Payer: Self-pay

## 2018-02-05 DIAGNOSIS — C9000 Multiple myeloma not having achieved remission: Secondary | ICD-10-CM

## 2018-02-05 MED ORDER — POMALIDOMIDE 2 MG PO CAPS: 2.0000 mg | ORAL_CAPSULE | Freq: Every day | ORAL | 11 refills | Status: DC

## 2018-02-05 NOTE — Telephone Encounter (Signed)
Oral Oncology Patient Advocate Encounter  Bradley Hunt brought his Social Security letter in to the office for me to see. The patient is currently paying for medicare part A and B but has not received a red white and blue card that has this billing information. I called medicare this morning and put Bradley Hunt on a 3 way call since medicare would not give me any information without his ok. Medicare representative gave [place] the medicare ID number and I asked her to explain to him how he could apply for Part D. His options are applying online or calling in to the same number [phone] and they can go over his options and help him sign up over the phone. I have emailed [employer] to let them know this billing information and that he currently does not have prescription coverage. [employer] responded stating that they will work on getting his approval now.  Bradley Hunt verbalized understanding and great appreciation.  Bradley Hunt Phone [phone] Fax [phone]

## 2018-02-06 NOTE — Telephone Encounter (Signed)
Oral Oncology Patient Advocate Encounter  Pomalyst has been approved with [employer] until 01/28/19.  I called Bradley Hunt and gave him the good news, he verbalized understanding and great appreciation.  [name] Patient [name] Phone [phone] Fax [phone]

## 2018-02-24 MED FILL — ACYCLOVIR 400 MG TABLET: 400 | 30 days supply | Qty: 60 | Fill #4

## 2018-03-03 ENCOUNTER — Telehealth: Payer: Self-pay

## 2018-03-03 NOTE — Telephone Encounter (Signed)
I do not know of any dentist that will take medicare He will have to go to phone book and call each practice directly

## 2018-03-03 NOTE — Telephone Encounter (Signed)
He showed up in the lobby. He tried to go to the dentist today and they do not take his insurance. He now has [employer]. He trying to get dental clearance for Zometa. He is looking for a dentist that takes Medicare.

## 2018-03-03 NOTE — Telephone Encounter (Signed)
Called and given below message. He verbalized understanding. 

## 2018-03-04 ENCOUNTER — Other Ambulatory Visit: Payer: Self-pay

## 2018-03-04 DIAGNOSIS — C9000 Multiple myeloma not having achieved remission: Secondary | ICD-10-CM

## 2018-03-04 MED ORDER — POMALIDOMIDE 2 MG PO CAPS: 2.0000 mg | ORAL_CAPSULE | Freq: Every day | ORAL | 11 refills | Status: DC

## 2018-03-26 MED FILL — ACYCLOVIR 400 MG TABLET: 400 | 30 days supply | Qty: 60 | Fill #5 | Status: TO

## 2018-03-31 ENCOUNTER — Other Ambulatory Visit: Payer: Self-pay

## 2018-03-31 DIAGNOSIS — C9000 Multiple myeloma not having achieved remission: Secondary | ICD-10-CM

## 2018-03-31 MED ORDER — POMALIDOMIDE 2 MG PO CAPS: 2.0000 mg | ORAL_CAPSULE | Freq: Every day | ORAL | 11 refills | Status: DC

## 2018-04-03 ENCOUNTER — Encounter (HOSPITAL_COMMUNITY): Payer: Self-pay | Admitting: *Deleted

## 2018-04-03 ENCOUNTER — Emergency Department (HOSPITAL_COMMUNITY)
Admission: EM | Admit: 2018-04-03 | Discharge: 2018-04-04 | Disposition: A | Discharge status: Home / Self Care | Payer: Medicare Other | Attending: Emergency Medicine | Admitting: Emergency Medicine

## 2018-04-03 ENCOUNTER — Other Ambulatory Visit: Payer: Self-pay

## 2018-04-03 DIAGNOSIS — Z7982 Long term (current) use of aspirin: Secondary | ICD-10-CM | POA: Diagnosis not present

## 2018-04-03 DIAGNOSIS — Z79899 Other long term (current) drug therapy: Secondary | ICD-10-CM | POA: Diagnosis not present

## 2018-04-03 DIAGNOSIS — R05 Cough: Secondary | ICD-10-CM | POA: Diagnosis present

## 2018-04-03 DIAGNOSIS — Z87891 Personal history of nicotine dependence: Secondary | ICD-10-CM | POA: Insufficient documentation

## 2018-04-03 DIAGNOSIS — J101 Influenza due to other identified influenza virus with other respiratory manifestations: Secondary | ICD-10-CM | POA: Insufficient documentation

## 2018-04-03 NOTE — ED Triage Notes (Signed)
Pt states that since yesterday pt has been having generalized aches, runny nose, headache.  Pt took OTC cold medications but feels worse today.  Pt is a multiple myeloma pt and is taking chemo pills.

## 2018-04-04 ENCOUNTER — Emergency Department (HOSPITAL_COMMUNITY): Payer: Medicare Other

## 2018-04-04 LAB — CBC WITH DIFFERENTIAL/PLATELET
Abs Immature Granulocytes: 0 10*3/uL (ref 0.00–0.07)
Basophils Absolute: 0.1 10*3/uL (ref 0.0–0.1)
Basophils Relative: 2 %
Eosinophils Absolute: 0.3 10*3/uL (ref 0.0–0.5)
Eosinophils Relative: 10 %
HCT: 44.2 % (ref 39.0–52.0)
Hemoglobin: 14.3 g/dL (ref 13.0–17.0)
Immature Granulocytes: 0 %
Lymphocytes Relative: 25 %
Lymphs Abs: 0.9 10*3/uL (ref 0.7–4.0)
MCH: 30.8 pg (ref 26.0–34.0)
MCHC: 32.4 g/dL (ref 30.0–36.0)
MCV: 95.1 fL (ref 80.0–100.0)
Monocytes Absolute: 0.5 10*3/uL (ref 0.1–1.0)
Monocytes Relative: 16 %
Neutro Abs: 1.6 10*3/uL — ABNORMAL LOW (ref 1.7–7.7)
Neutrophils Relative %: 47 %
Platelets: 154 10*3/uL (ref 150–400)
RBC: 4.65 MIL/uL (ref 4.22–5.81)
RDW: 13.5 % (ref 11.5–15.5)
WBC: 3.4 10*3/uL — ABNORMAL LOW (ref 4.0–10.5)
nRBC: 0 % (ref 0.0–0.2)

## 2018-04-04 LAB — BASIC METABOLIC PANEL WITH GFR
Anion gap: 11 (ref 5–15)
BUN: 13 mg/dL (ref 6–20)
CO2: 25 mmol/L (ref 22–32)
Calcium: 8.9 mg/dL (ref 8.9–10.3)
Chloride: 102 mmol/L (ref 98–111)
Creatinine, Ser: 1.06 mg/dL (ref 0.61–1.24)
GFR calc Af Amer: >60 mL/min
GFR calc non Af Amer: >60 mL/min
Glucose, Bld: 98 mg/dL (ref 70–99)
Potassium: 3.4 mmol/L — ABNORMAL LOW (ref 3.5–5.1)
Sodium: 138 mmol/L (ref 135–145)

## 2018-04-04 LAB — INFLUENZA PANEL BY PCR (TYPE A & B)
INFLAPCR: NEGATIVE
Influenza B By PCR: POSITIVE — AB

## 2018-04-04 MED ORDER — BENZONATATE 100 MG PO CAPS
100.0000 mg | ORAL_CAPSULE | Freq: Once | ORAL | Status: AC
  Administered 2018-04-04: 100 mg via ORAL
  Filled 2018-04-04: qty 1

## 2018-04-04 MED ORDER — OSELTAMIVIR PHOSPHATE 75 MG PO CAPS: 75.0000 mg | ORAL_CAPSULE | Freq: Two times a day (BID) | ORAL | 0 refills | Status: DC

## 2018-04-04 MED ORDER — OSELTAMIVIR PHOSPHATE 75 MG PO CAPS
75.0000 mg | ORAL_CAPSULE | Freq: Once | ORAL | Status: AC
  Administered 2018-04-04: 75 mg via ORAL
  Filled 2018-04-04: qty 1

## 2018-04-04 MED ORDER — IBUPROFEN 200 MG PO TABS
400.0000 mg | ORAL_TABLET | Freq: Once | ORAL | Status: AC
  Administered 2018-04-04: 400 mg via ORAL
  Filled 2018-04-04: qty 2

## 2018-04-04 NOTE — ED Provider Notes (Signed)
[facility] DEPT Provider Note   CSN: [id number] Arrival date & time: 04/03/18  2337    History   Chief Complaint Chief Complaint  Patient presents with  . Generalized Body Aches  . Headache    HPI Bradley Hunt is a 48 y.o. male.     The history is provided by the patient.  Influenza  Presenting symptoms: cough, fatigue, fever, headache, myalgias, nausea, rhinorrhea and sore throat   Presenting symptoms: no diarrhea, no shortness of breath and no vomiting   Severity:  Moderate Onset quality:  Gradual Duration:  1 day Progression:  Worsening Chronicity:  New Relieved by:  Nothing Worsened by:  Nothing Associated symptoms: chills   Risk factors: immunocompromised state   Patient with history of multiple myeloma, currently on oral chemo He presents with flulike illness-cough/rhinorrhea/headache/myalgias/fevers. He reports tactile fevers, none recorded. No recent travel. No Covid-19 exposure known He is a nonsmoker No cp/sob No dyspnea on exertion  Past Medical History:  Diagnosis Date  . Bone metastases ([place])    T spine and L spine  . History of chemotherapy   . History of radiation therapy   . Multiple myeloma not having achieved remission ([place]) 06/25/15  . Numbness    lower extermities bilat     Patient Active Problem List   Diagnosis Date Noted  . Chronic back pain greater than 3 months duration 01/19/2018  . Encounter for screening for HIV 05/12/2017  . Pancytopenia, acquired ([place]) 07/17/2016  . Goals of care, counseling/discussion 05/10/2016  . 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 Constipation, chronic 11/03/2015  . Cancer associated pain 09/11/2015  . Multiple myeloma not having achieved remission ([place]) 07/18/2015  . 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 Leukocytosis 06/24/2015  . Anemia in neoplastic  disease 06/24/2015  . Bony metastasis ([place]) 06/24/2015    Past Surgical History:  Procedure Laterality Date  . ANTERIOR CRUCIATE LIGAMENT REPAIR    . IR FLUORO GUIDE PORT INSERTION RIGHT  05/16/2016  . IR REMOVAL TUN ACCESS W/ PORT W/O FL MOD SED  07/23/2017  . IR US GUIDE VASC ACCESS RIGHT  05/16/2016  . LAMINECTOMY N/A 06/25/2015   Procedure: Thoracic six LAMINECTOMY RESECTION FOR TUMOR;  Surgeon: Consuella Lose, MD;  Location: [facility];  Service: Neurosurgery;  Laterality: N/A;        Home Medications    Prior to Admission medications   Medication Sig Start Date End Date Taking? Authorizing Provider  acyclovir (ZOVIRAX) 400 MG tablet Take 1 tablet (400 mg total) by mouth 2 (two) times daily. 10/06/17   [name], MD  aspirin EC 81 MG tablet Take 81 mg by mouth daily. 08/29/16   [provider]  ibuprofen (ADVIL,MOTRIN) 800 MG tablet Take 800 mg by mouth every 8 (eight) hours as needed (pain).    [provider]  lidocaine-prilocaine (EMLA) cream Apply to affected area once 05/12/17   [name], MD  Oxycodone HCl 10 MG TABS Take 1 tablet (10 mg total) by mouth every 6 (six) hours as needed. 01/19/18   [name], MD  pantoprazole (PROTONIX) 40 MG tablet Take 40 mg by mouth daily. 12/18/16   [provider]  pomalidomide (POMALYST) 2 MG capsule Take 1 capsule (2 mg total) by mouth daily. Take with water for 21 days on,7 days off. Repeat every 28 days. 03/31/18   [name], MD  Family History Family History  Problem Relation Age of Onset  . Cancer Neg Hx     Social History Social History   Tobacco Use  . Smoking status: Former Smoker    Packs/day: 1.00    Years: 10.00    Pack years: 10.00    Types: Cigarettes    Last attempt to quit: 01/28/1997    Years since quitting: 21.1  . Smokeless tobacco: Never Used  Substance Use Topics  . Alcohol use: No  . Drug use: No     Allergies   Patient has no known allergies.   Review of  Systems Review of Systems  Constitutional: Positive for chills, fatigue and fever.  HENT: Positive for rhinorrhea and sore throat.   Respiratory: Positive for cough. Negative for shortness of breath.   Gastrointestinal: Positive for nausea. Negative for diarrhea and vomiting.  Musculoskeletal: Positive for myalgias.  Neurological: Positive for headaches.  All other systems reviewed and are negative.    Physical Exam Updated Vital Signs BP (!) 144/106 (BP Location: Right Arm)   Pulse (!) 104   Temp 99 F (37.2 C) (Oral)   Resp 16   Ht 1.88 m (6' 2" )   Wt 106.6 kg   SpO2 100%   BMI 30.17 kg/m   Physical Exam CONSTITUTIONAL: Well developed/well nourished, well-appearing HEAD: Normocephalic/atraumatic EYES: EOMI/PERRL ENMT: Mucous membranes moist, uvula midline without erythema or exudates NECK: supple no meningeal signs SPINE/BACK:entire spine nontender CV: S1/S2 noted, no murmurs/rubs/gallops noted LUNGS: Lungs are clear to auscultation bilaterally, no apparent distress ABDOMEN: soft, nontender, no rebound or guarding, bowel sounds noted throughout abdomen GU:no cva tenderness NEURO: Pt is awake/alert/appropriate, moves all extremitiesx4.  No facial droop.   EXTREMITIES: pulses normal/equal, full ROM SKIN: warm, color normal PSYCH: no abnormalities of mood noted, alert and oriented to situation  ED Treatments / Results  Labs (all labs ordered are listed, but only abnormal results are displayed) Labs Reviewed  BASIC METABOLIC PANEL - Abnormal; Notable for the following components:      Result Value   Potassium 3.4 (*)    All other components within normal limits  CBC WITH DIFFERENTIAL/PLATELET - Abnormal; Notable for the following components:   WBC 3.4 (*)    Neutro Abs 1.6 (*)    All other components within normal limits  INFLUENZA PANEL BY PCR (TYPE A & B) - Abnormal; Notable for the following components:   Influenza B By PCR POSITIVE (*)    All other components  within normal limits    EKG None  Radiology Dg Chest 2 View  Result Date: 04/04/2018 CLINICAL DATA:  Generalized aches and headache with runny nose. EXAM: CHEST - 2 VIEW COMPARISON:  04/03/2017 FINDINGS: The heart size and mediastinal contours are within normal limits. Both lungs are clear. The visualized skeletal structures are unremarkable. IMPRESSION: No active cardiopulmonary disease. Electronically Signed   By: Ashley Royalty M.D.   On: 04/04/2018 00:41    Procedures Procedures (including critical care time)  Medications Ordered in ED Medications  oseltamivir (TAMIFLU) capsule 75 mg (has no administration in time range)  ibuprofen (ADVIL,MOTRIN) tablet 400 mg (400 mg Oral Given 04/04/18 0116)  benzonatate (TESSALON) capsule 100 mg (100 mg Oral Given 04/04/18 0208)     Initial Impression / Assessment and Plan / ED Course  I have reviewed the triage vital signs and the nursing notes.  Pertinent labs & imaging results that were available during my care of the patient were reviewed by me  and considered in my medical decision making (see chart for details).        1:00 AM Patient with history of multiple myeloma currently on chemo presenting with flulike illness for past 24 hours He is very well-appearing, no acute distress.  No hypoxia.  Lung sounds are clear. Strong suspicion for influenza.  Labs and imaging are pending 3:20 AM Patient found to be positive for influenza B. He remains afebrile.  Not septic appearing, no acute distress, no hypoxia Will start Tamiflu. I discussed the case with on-call oncologist Dr. Inetta Fermo reviewed.  Plan will be to start Tamiflu, no other intervention required at this time We discussed strict return precautions Final Clinical Impressions(s) / ED Diagnoses   Final diagnoses:  Influenza B    ED Discharge Orders         Ordered    oseltamivir (TAMIFLU) 75 MG capsule  Every 12 hours     04/04/18 0305           Ripley Fraise,  MD 04/04/18 [phone]

## 2018-04-13 ENCOUNTER — Other Ambulatory Visit: Payer: Self-pay

## 2018-04-13 ENCOUNTER — Inpatient Hospital Stay: Payer: Medicare Other | Attending: Hematology and Oncology

## 2018-04-13 ENCOUNTER — Other Ambulatory Visit: Payer: Self-pay | Admitting: Hematology and Oncology

## 2018-04-13 ENCOUNTER — Other Ambulatory Visit: Payer: Self-pay | Admitting: *Deleted

## 2018-04-13 DIAGNOSIS — C9000 Multiple myeloma not having achieved remission: Secondary | ICD-10-CM

## 2018-04-13 DIAGNOSIS — Z79899 Other long term (current) drug therapy: Secondary | ICD-10-CM | POA: Insufficient documentation

## 2018-04-13 DIAGNOSIS — G8929 Other chronic pain: Secondary | ICD-10-CM | POA: Diagnosis not present

## 2018-04-13 DIAGNOSIS — M549 Dorsalgia, unspecified: Secondary | ICD-10-CM | POA: Insufficient documentation

## 2018-04-13 LAB — CBC WITH DIFFERENTIAL/PLATELET
ABS IMMATURE GRANULOCYTES: 0.03 10*3/uL (ref 0.00–0.07)
Basophils Absolute: 0.1 10*3/uL (ref 0.0–0.1)
Basophils Relative: 1 %
Eosinophils Absolute: 0.3 10*3/uL (ref 0.0–0.5)
Eosinophils Relative: 5 %
HCT: 38.6 % — ABNORMAL LOW (ref 39.0–52.0)
Hemoglobin: 13 g/dL (ref 13.0–17.0)
Immature Granulocytes: 1 %
Lymphocytes Relative: 24 %
Lymphs Abs: 1.2 10*3/uL (ref 0.7–4.0)
MCH: 31 pg (ref 26.0–34.0)
MCHC: 33.7 g/dL (ref 30.0–36.0)
MCV: 92.1 fL (ref 80.0–100.0)
Monocytes Absolute: 0.4 10*3/uL (ref 0.1–1.0)
Monocytes Relative: 7 %
NEUTROS ABS: 3.1 10*3/uL (ref 1.7–7.7)
Neutrophils Relative %: 62 %
Platelets: 267 10*3/uL (ref 150–400)
RBC: 4.19 MIL/uL — AB (ref 4.22–5.81)
RDW: 13.1 % (ref 11.5–15.5)
WBC: 5.1 10*3/uL (ref 4.0–10.5)
nRBC: 0 % (ref 0.0–0.2)

## 2018-04-13 LAB — COMPREHENSIVE METABOLIC PANEL
ALT: 44 U/L (ref 0–44)
AST: 21 U/L (ref 15–41)
Albumin: 3.4 g/dL — ABNORMAL LOW (ref 3.5–5.0)
Alkaline Phosphatase: 52 U/L (ref 38–126)
Anion gap: 10 (ref 5–15)
BUN: 21 mg/dL — ABNORMAL HIGH (ref 6–20)
CO2: 21 mmol/L — ABNORMAL LOW (ref 22–32)
Calcium: 8.7 mg/dL — ABNORMAL LOW (ref 8.9–10.3)
Chloride: 110 mmol/L (ref 98–111)
Creatinine, Ser: 1.1 mg/dL (ref 0.61–1.24)
GFR calc non Af Amer: >60 mL/min (ref >60)
Glucose, Bld: 96 mg/dL (ref 70–99)
Potassium: 3.8 mmol/L (ref 3.5–5.1)
Sodium: 141 mmol/L (ref 135–145)
Total Bilirubin: 0.4 mg/dL (ref 0.3–1.2)
Total Protein: 7.1 g/dL (ref 6.5–8.1)

## 2018-04-14 LAB — MULTIPLE MYELOMA PANEL, SERUM
ALBUMIN/GLOB SERPL: 1.1 (ref 0.7–1.7)
ALPHA2 GLOB SERPL ELPH-MCNC: 0.8 g/dL (ref 0.4–1.0)
Albumin SerPl Elph-Mcnc: 3.4 g/dL (ref 2.9–4.4)
Alpha 1: 0.2 g/dL (ref 0.0–0.4)
B-Globulin SerPl Elph-Mcnc: 0.8 g/dL (ref 0.7–1.3)
Gamma Glob SerPl Elph-Mcnc: 1.2 g/dL (ref 0.4–1.8)
Globulin, Total: 3.1 g/dL (ref 2.2–3.9)
IGM (IMMUNOGLOBULIN M), SRM: 46 mg/dL (ref 20–172)
IgA: 83 mg/dL — ABNORMAL LOW (ref 90–386)
IgG (Immunoglobin G), Serum: 1416 mg/dL (ref 700–1600)
Total Protein ELP: 6.5 g/dL (ref 6.0–8.5)

## 2018-04-14 LAB — KAPPA/LAMBDA LIGHT CHAINS
KAPPA FREE LGHT CHN: 30.1 mg/L — AB (ref 3.3–19.4)
Kappa, lambda light chain ratio: 1.22 (ref 0.26–1.65)
Lambda free light chains: 24.6 mg/L (ref 5.7–26.3)

## 2018-04-14 LAB — BETA 2 MICROGLOBULIN, SERUM: Beta-2 Microglobulin: 1.4 mg/L (ref 0.6–2.4)

## 2018-04-20 ENCOUNTER — Telehealth: Payer: Self-pay | Admitting: Hematology and Oncology

## 2018-04-20 ENCOUNTER — Inpatient Hospital Stay (HOSPITAL_BASED_OUTPATIENT_CLINIC_OR_DEPARTMENT_OTHER): Payer: Medicare Other | Admitting: Hematology and Oncology

## 2018-04-20 ENCOUNTER — Other Ambulatory Visit: Payer: Self-pay

## 2018-04-20 ENCOUNTER — Encounter: Payer: Self-pay | Admitting: Hematology and Oncology

## 2018-04-20 DIAGNOSIS — M549 Dorsalgia, unspecified: Secondary | ICD-10-CM

## 2018-04-20 DIAGNOSIS — C9000 Multiple myeloma not having achieved remission: Secondary | ICD-10-CM

## 2018-04-20 DIAGNOSIS — Z79899 Other long term (current) drug therapy: Secondary | ICD-10-CM | POA: Diagnosis not present

## 2018-04-20 DIAGNOSIS — G8929 Other chronic pain: Secondary | ICD-10-CM

## 2018-04-20 NOTE — Telephone Encounter (Signed)
Gave avs and calendar ° °

## 2018-04-21 ENCOUNTER — Encounter: Payer: Self-pay | Admitting: Hematology and Oncology

## 2018-04-21 NOTE — Progress Notes (Signed)
[name]  Patient Care Team: [name], MD as PCP - General (Internal Medicine)  ASSESSMENT & PLAN:  Multiple myeloma not having achieved remission [facility]) He will continue Pomalyst single agent Recent myeloma panel showed complete remission He is reminded to take acyclovir for antimicrobial prophylaxis He is reminded to take aspirin for DVT prophylaxis Due to inability to get dental clearance, we will hold off Zometa I recommend resuming primary care doctor's visit for health maintenance issues I will see him every 3 months  Chronic back pain greater than 3 months duration He has chronic back pain secondary to compression fracture in the past. He has successfully weaned himself off oxycodone. I recommend him to continue on calcium and vitamin D.   No orders of the defined types were placed in this encounter.   INTERVAL HISTORY: Please see below for problem oriented charting. He returns for myeloma follow-up The patient has taken himself off work recently but plan to resume walking He has successfully taper off oxycodone He denies difficulties getting Pomalyst He just completed a course of antibiotics recently due to bronchitis He feels well now without any cough, fever or chills He was not able to see a dentist yet due to insurance issue.  SUMMARY OF ONCOLOGIC HISTORY:   Multiple myeloma not having achieved remission ([place])   06/23/2015 - 06/28/2015 Hospital Admission    The patient was admitted to the hospital due to gait ataxia and back pain. He was subsequently found to have cord compression underwent surgery and was discharged home    06/24/2015 Imaging    Abnormal appearance of the T6 vertebral body, highly suspicious for possible osseous metastasis. Associated pathologic fracture withup to 30% height loss. There is associated abnormal soft tissue density within the ventral epidural space,    06/24/2015 Imaging    MRI lumbar:  Focal osseous lesion with abnormal enhancement involving the right pedicle of L3, suspicious for possible osseous metastasisgiven the findings in the thoracic spine. Question additional focal lesion within the right iliac wing as above.      06/25/2015 Pathology Results    Accession: [id number] bone biopsy come from plasma cell neoplasm.    06/25/2015 Surgery    He had T6 laminectomy, bilateral transpedicular approach for resection of tumor, decompression of thecal sac and microdissection    07/20/2015 Bone Marrow Biopsy    BM biopsy showed 50% involvement; Cytogenetics 46XY, positive for 13q-    07/31/2015 - 11/03/2015 Chemotherapy    He received Velcade, Revlimid and Dex. Zometa is not given due to inability to get dental clearance    12/14/2015 - 04/24/2016 Chemotherapy    He is started on maintenance treatment with Revlimid only    12/18/2015 Imaging    MRI thoracic and lumbar spine showed numerous enhancing foci throughout the thoracic and lumbar spine with several new small foci in the lumbar spine in comparison with prior MRI compatible with metastatic disease. Stable loss of height of the T3, T4, and T6 vertebral bodies and new postsurgical changes related to T6 laminectomy. No significant epidural disease or evidence for cord compression. No abnormal enhancement of the spinal cord or cauda equina.    05/02/2016 Bone Marrow Biopsy    Outside bone marrow biopsy showed 20% myeloma involvement    05/16/2016 Procedure    Successful placement of a right internal jugular approach power injectable Port-A-Cath. The catheter is ready for immediate use.    05/21/2016 - 07/03/2016 Chemotherapy  He received Kyprolis, Cytoxan and dexamethasone     07/08/2016 Procedure    Status post CT-guided bone marrow biopsy, with tissue specimen sent to pathology for complete histopathologic analysis    07/08/2016 Bone Marrow Biopsy    Bone Marrow, Aspirate,Biopsy, and Clot BONE MARROW: - MILDLY HYPERCELLULAR  MARROW (60%) WITH PLASMA CELL NEOPLASM - SEE COMMENT PERIPHERAL BLOOD: - NORMOCYTIC ANEMIA Diagnosis Note The marrow is hypercellular with lambda-restricted plasma cells consistent with persistence of the patient's previously diagnosed plasma cell neoplasm. The plasma cells comprise approximately 10-15% of the total marrow cellularity, are enlarged, and arranged in clusters.    08/14/2016 Miscellaneous    He received conditioning treatment with melphalan    08/15/2016 Bone Marrow Transplant    He received autologous stem cell transplant    08/24/2016 - 08/29/2016 Hospital Admission    His post-transplant course was complicated by E-Coli bacteremia    11/26/2016 PET scan    PET CT at [facility] 1. Technically limited study due to soft tissue uptake. 2. New hypermetabolic uptake at C7 spinous process and left proximal femur that is of questionable significance in absence of underlying CT correlate. Further assessment with whole body bone scan may be considered. 2. Redemonstrated nonhypermetabolic multifocal lucent and sclerotic lesions throughout the spine which are similar to prior.     12/02/2016 Bone Marrow Biopsy    He had repeat bone marrow biopsy at [facility] An immunohistochemical stain for CD138 is performed on the bone marrow core biopsy demonstrates increased plasma cells with focal clustering (10-20% overall), which are monotypic for lambda light chain by in situ hybridization.    01/06/2017 - 06/09/2017 Chemotherapy    He received weekly Dexamethasone, Pomalyst days 1-21 and Daratumumab. From 06/08/17 onwards, he is placed on maintenance Pomalyst only    07/23/2017 Procedure    Successful right IJ vein Port-A-Cath explant.     REVIEW OF SYSTEMS:   Constitutional: Denies fevers, chills or abnormal weight loss Eyes: Denies blurriness of vision Ears, nose, mouth, throat, and face: Denies mucositis or sore throat Respiratory: Denies cough, dyspnea or wheezes Cardiovascular: Denies  palpitation, chest discomfort or lower extremity swelling Gastrointestinal:  Denies nausea, heartburn or change in bowel habits Skin: Denies abnormal skin rashes Lymphatics: Denies new lymphadenopathy or easy bruising Neurological:Denies numbness, tingling or new weaknesses Behavioral/Psych: Mood is stable, no new changes  All other systems were reviewed with the patient and are negative.  I have reviewed the past medical history, past surgical history, social history and family history with the patient and they are unchanged from previous note.  ALLERGIES:  has No Known Allergies.  MEDICATIONS:  Current Outpatient Medications  Medication Sig Dispense Refill  . acyclovir (ZOVIRAX) 400 MG tablet Take 1 tablet (400 mg total) by mouth 2 (two) times daily. 60 tablet 11  . aspirin EC 81 MG tablet Take 81 mg by mouth daily.    Marland Kitchen ibuprofen (ADVIL,MOTRIN) 800 MG tablet Take 800 mg by mouth every 8 (eight) hours as needed (pain).    . pomalidomide (POMALYST) 2 MG capsule Take 1 capsule (2 mg total) by mouth daily. Take with water for 21 days on,7 days off. Repeat every 28 days. 21 capsule 11   No current facility-administered medications for this visit.     PHYSICAL EXAMINATION: ECOG PERFORMANCE STATUS: 0 - Asymptomatic  Vitals:   04/20/18 0838  BP: 120/67  Pulse: 73  Resp: 18  Temp: 98.3 F (36.8 C)  SpO2: 100%   Filed Weights  04/20/18 0838  Weight: 231 lb 12.8 oz (105.1 kg)    GENERAL:alert, no distress and comfortable Musculoskeletal:no cyanosis of digits and no clubbing  NEURO: alert & oriented x 3 with fluent speech, no focal motor/sensory deficits  LABORATORY DATA:  I have reviewed the data as listed    Component Value Date/Time   NA 141 04/13/2018 0813   NA 139 01/27/2017 0809   K 3.8 04/13/2018 0813   K 4.1 01/27/2017 0809   CL 110 04/13/2018 0813   CO2 21 (L) 04/13/2018 0813   CO2 23 01/27/2017 0809   GLUCOSE 96 04/13/2018 0813   GLUCOSE 122 01/27/2017 0809    BUN 21 (H) 04/13/2018 0813   BUN 14.3 01/27/2017 0809   CREATININE 1.10 04/13/2018 0813   CREATININE 0.9 01/27/2017 0809   CALCIUM 8.7 (L) 04/13/2018 0813   CALCIUM 9.1 01/27/2017 0809   PROT 7.1 04/13/2018 0813   PROT 6.2 (L) 01/27/2017 0809   ALBUMIN 3.4 (L) 04/13/2018 0813   ALBUMIN 3.7 01/27/2017 0809   AST 21 04/13/2018 0813   AST 10 01/27/2017 0809   ALT 44 04/13/2018 0813   ALT 18 01/27/2017 0809   ALKPHOS 52 04/13/2018 0813   ALKPHOS 42 01/27/2017 0809   BILITOT 0.4 04/13/2018 0813   BILITOT 0.72 01/27/2017 0809   GFRNONAA >60 04/13/2018 0813   GFRAA >60 04/13/2018 0813    No results found for: SPEP, UPEP  Lab Results  Component Value Date   WBC 5.1 04/13/2018   NEUTROABS 3.1 04/13/2018   HGB 13.0 04/13/2018   HCT 38.6 (L) 04/13/2018   MCV 92.1 04/13/2018   PLT 267 04/13/2018      Chemistry      Component Value Date/Time   NA 141 04/13/2018 0813   NA 139 01/27/2017 0809   K 3.8 04/13/2018 0813   K 4.1 01/27/2017 0809   CL 110 04/13/2018 0813   CO2 21 (L) 04/13/2018 0813   CO2 23 01/27/2017 0809   BUN 21 (H) 04/13/2018 0813   BUN 14.3 01/27/2017 0809   CREATININE 1.10 04/13/2018 0813   CREATININE 0.9 01/27/2017 0809      Component Value Date/Time   CALCIUM 8.7 (L) 04/13/2018 0813   CALCIUM 9.1 01/27/2017 0809   ALKPHOS 52 04/13/2018 0813   ALKPHOS 42 01/27/2017 0809   AST 21 04/13/2018 0813   AST 10 01/27/2017 0809   ALT 44 04/13/2018 0813   ALT 18 01/27/2017 0809   BILITOT 0.4 04/13/2018 0813   BILITOT 0.72 01/27/2017 0809       RADIOGRAPHIC STUDIES: I have personally reviewed the radiological images as listed and agreed with the findings in the report. Dg Chest 2 View  Result Date: 04/04/2018 CLINICAL DATA:  Generalized aches and headache with runny nose. EXAM: CHEST - 2 VIEW COMPARISON:  04/03/2017 FINDINGS: The heart size and mediastinal contours are within normal limits. Both lungs are clear. The visualized skeletal structures are  unremarkable. IMPRESSION: No active cardiopulmonary disease. Electronically Signed   By: Ashley Royalty M.D.   On: 04/04/2018 00:41    All questions were answered. The patient knows to call the clinic with any problems, questions or concerns. No barriers to learning was detected.  I spent 15 minutes counseling the patient face to face. The total time spent in the appointment was 20 minutes and more than 50% was on counseling and review of test results  [name], MD 04/21/2018 9:12 AM

## 2018-04-21 NOTE — Assessment & Plan Note (Signed)
He will continue Pomalyst single agent Recent myeloma panel showed complete remission He is reminded to take acyclovir for antimicrobial prophylaxis He is reminded to take aspirin for DVT prophylaxis Due to inability to get dental clearance, we will hold off Zometa I recommend resuming primary care doctor's visit for health maintenance issues I will see him every 3 months 

## 2018-04-21 NOTE — Assessment & Plan Note (Signed)
He has chronic back pain secondary to compression fracture in the past. He has successfully weaned himself off oxycodone. I recommend him to continue on calcium and vitamin D.

## 2018-04-22 ENCOUNTER — Other Ambulatory Visit: Payer: Self-pay | Admitting: Hematology and Oncology

## 2018-04-22 DIAGNOSIS — C9000 Multiple myeloma not having achieved remission: Secondary | ICD-10-CM

## 2018-04-22 NOTE — Telephone Encounter (Signed)
Prescription for acyclovir sent electronically.

## 2018-04-30 ENCOUNTER — Other Ambulatory Visit: Payer: Self-pay

## 2018-04-30 DIAGNOSIS — C9000 Multiple myeloma not having achieved remission: Secondary | ICD-10-CM

## 2018-04-30 MED ORDER — POMALIDOMIDE 2 MG PO CAPS: 2.0000 mg | ORAL_CAPSULE | Freq: Every day | ORAL | 11 refills | Status: DC

## 2018-05-05 ENCOUNTER — Other Ambulatory Visit: Payer: Self-pay

## 2018-05-14 ENCOUNTER — Encounter: Payer: Self-pay | Admitting: Hematology and Oncology

## 2018-05-27 ENCOUNTER — Other Ambulatory Visit: Payer: Self-pay

## 2018-05-27 ENCOUNTER — Telehealth: Payer: Self-pay

## 2018-05-27 DIAGNOSIS — C9000 Multiple myeloma not having achieved remission: Secondary | ICD-10-CM

## 2018-05-27 MED ORDER — POMALIDOMIDE 2 MG PO CAPS: 2.0000 mg | ORAL_CAPSULE | Freq: Every day | ORAL | 11 refills | Status: DC

## 2018-05-27 NOTE — Telephone Encounter (Signed)
He called and left a message requesting refill on Pomalyst. Rx sent and called [facility] and told Rx sent. He verbalized understanding.

## 2018-06-26 ENCOUNTER — Other Ambulatory Visit: Payer: Self-pay | Admitting: *Deleted

## 2018-06-26 DIAGNOSIS — C9000 Multiple myeloma not having achieved remission: Secondary | ICD-10-CM

## 2018-06-26 MED ORDER — POMALIDOMIDE 2 MG PO CAPS: 2.0000 mg | ORAL_CAPSULE | Freq: Every day | ORAL | 11 refills | Status: DC

## 2018-07-02 ENCOUNTER — Other Ambulatory Visit: Payer: Self-pay

## 2018-07-02 ENCOUNTER — Telehealth: Payer: Self-pay

## 2018-07-02 DIAGNOSIS — C9000 Multiple myeloma not having achieved remission: Secondary | ICD-10-CM

## 2018-07-02 MED ORDER — POMALIDOMIDE 2 MG PO CAPS: 2.0000 mg | ORAL_CAPSULE | Freq: Every day | ORAL | 11 refills | Status: DC

## 2018-07-02 NOTE — Telephone Encounter (Signed)
He called and left a message to call him.  Called back. He needs the phone number of pharmacy. His Pomalyst was supposed to be delivered Tuesday. Pharmacy number given.

## 2018-07-02 NOTE — Telephone Encounter (Signed)
He called and left a message. He called pharmacy and they need the office to call. [facility] Rx is not filled thru that pharmacy. Rx for Pomalyst sent to [facility]. Called back and told him Rx sent to [facility] and given phone # of pharmacy.

## 2018-07-13 ENCOUNTER — Other Ambulatory Visit: Payer: Self-pay

## 2018-07-13 ENCOUNTER — Inpatient Hospital Stay: Payer: Medicare Other | Attending: Hematology and Oncology

## 2018-07-13 DIAGNOSIS — C9000 Multiple myeloma not having achieved remission: Secondary | ICD-10-CM | POA: Diagnosis not present

## 2018-07-13 DIAGNOSIS — Z79899 Other long term (current) drug therapy: Secondary | ICD-10-CM | POA: Insufficient documentation

## 2018-07-13 DIAGNOSIS — Z9484 Stem cells transplant status: Secondary | ICD-10-CM | POA: Insufficient documentation

## 2018-07-13 DIAGNOSIS — Z9221 Personal history of antineoplastic chemotherapy: Secondary | ICD-10-CM | POA: Diagnosis not present

## 2018-07-13 DIAGNOSIS — D630 Anemia in neoplastic disease: Secondary | ICD-10-CM | POA: Diagnosis not present

## 2018-07-13 DIAGNOSIS — M549 Dorsalgia, unspecified: Secondary | ICD-10-CM | POA: Insufficient documentation

## 2018-07-13 DIAGNOSIS — G8929 Other chronic pain: Secondary | ICD-10-CM | POA: Diagnosis not present

## 2018-07-13 LAB — CBC WITH DIFFERENTIAL/PLATELET
Abs Immature Granulocytes: 0.01 10*3/uL (ref 0.00–0.07)
Basophils Absolute: 0.1 10*3/uL (ref 0.0–0.1)
Basophils Relative: 1 %
Eosinophils Absolute: 0.3 10*3/uL (ref 0.0–0.5)
Eosinophils Relative: 7 %
HCT: 37.8 % — ABNORMAL LOW (ref 39.0–52.0)
Hemoglobin: 12.9 g/dL — ABNORMAL LOW (ref 13.0–17.0)
Immature Granulocytes: 0 %
Lymphocytes Relative: 34 %
Lymphs Abs: 1.5 10*3/uL (ref 0.7–4.0)
MCH: 31.7 pg (ref 26.0–34.0)
MCHC: 34.1 g/dL (ref 30.0–36.0)
MCV: 92.9 fL (ref 80.0–100.0)
Monocytes Absolute: 0.3 10*3/uL (ref 0.1–1.0)
Monocytes Relative: 8 %
Neutro Abs: 2.2 10*3/uL (ref 1.7–7.7)
Neutrophils Relative %: 50 %
Platelets: 189 10*3/uL (ref 150–400)
RBC: 4.07 MIL/uL — ABNORMAL LOW (ref 4.22–5.81)
RDW: 13.3 % (ref 11.5–15.5)
WBC: 4.4 10*3/uL (ref 4.0–10.5)
nRBC: 0 % (ref 0.0–0.2)

## 2018-07-13 LAB — COMPREHENSIVE METABOLIC PANEL
ALT: 23 U/L (ref 0–44)
AST: 17 U/L (ref 15–41)
Albumin: 3.7 g/dL (ref 3.5–5.0)
Alkaline Phosphatase: 44 U/L (ref 38–126)
Anion gap: 8 (ref 5–15)
BUN: 23 mg/dL — ABNORMAL HIGH (ref 6–20)
CO2: 24 mmol/L (ref 22–32)
Calcium: 9.1 mg/dL (ref 8.9–10.3)
Chloride: 107 mmol/L (ref 98–111)
Creatinine, Ser: 1.09 mg/dL (ref 0.61–1.24)
GFR calc Af Amer: >60 mL/min (ref >60)
GFR calc non Af Amer: >60 mL/min (ref >60)
Glucose, Bld: 96 mg/dL (ref 70–99)
Potassium: 3.5 mmol/L (ref 3.5–5.1)
Sodium: 139 mmol/L (ref 135–145)
Total Bilirubin: 0.5 mg/dL (ref 0.3–1.2)
Total Protein: 7 g/dL (ref 6.5–8.1)

## 2018-07-14 LAB — MULTIPLE MYELOMA PANEL, SERUM
Albumin SerPl Elph-Mcnc: 3.6 g/dL (ref 2.9–4.4)
Albumin/Glob SerPl: 1.2 (ref 0.7–1.7)
Alpha 1: 0.2 g/dL (ref 0.0–0.4)
Alpha2 Glob SerPl Elph-Mcnc: 0.7 g/dL (ref 0.4–1.0)
B-Globulin SerPl Elph-Mcnc: 0.9 g/dL (ref 0.7–1.3)
Gamma Glob SerPl Elph-Mcnc: 1.5 g/dL (ref 0.4–1.8)
Globulin, Total: 3.2 g/dL (ref 2.2–3.9)
IgA: 89 mg/dL — ABNORMAL LOW (ref 90–386)
IgG (Immunoglobin G), Serum: 1534 mg/dL (ref 603–1613)
IgM (Immunoglobulin M), Srm: 31 mg/dL (ref 20–172)
Total Protein ELP: 6.8 g/dL (ref 6.0–8.5)

## 2018-07-14 LAB — KAPPA/LAMBDA LIGHT CHAINS
Kappa free light chain: 33.9 mg/L — ABNORMAL HIGH (ref 3.3–19.4)
Kappa, lambda light chain ratio: 1.39 (ref 0.26–1.65)
Lambda free light chains: 24.4 mg/L (ref 5.7–26.3)

## 2018-07-20 ENCOUNTER — Other Ambulatory Visit: Payer: Self-pay

## 2018-07-20 ENCOUNTER — Inpatient Hospital Stay (HOSPITAL_BASED_OUTPATIENT_CLINIC_OR_DEPARTMENT_OTHER): Payer: Medicare Other | Admitting: Hematology and Oncology

## 2018-07-20 ENCOUNTER — Encounter: Payer: Self-pay | Admitting: Hematology and Oncology

## 2018-07-20 DIAGNOSIS — G8929 Other chronic pain: Secondary | ICD-10-CM | POA: Diagnosis not present

## 2018-07-20 DIAGNOSIS — D630 Anemia in neoplastic disease: Secondary | ICD-10-CM

## 2018-07-20 DIAGNOSIS — M549 Dorsalgia, unspecified: Secondary | ICD-10-CM | POA: Diagnosis not present

## 2018-07-20 DIAGNOSIS — C9000 Multiple myeloma not having achieved remission: Secondary | ICD-10-CM | POA: Diagnosis not present

## 2018-07-20 DIAGNOSIS — Z9221 Personal history of antineoplastic chemotherapy: Secondary | ICD-10-CM

## 2018-07-20 DIAGNOSIS — Z79899 Other long term (current) drug therapy: Secondary | ICD-10-CM

## 2018-07-20 DIAGNOSIS — Z9484 Stem cells transplant status: Secondary | ICD-10-CM

## 2018-07-20 NOTE — Assessment & Plan Note (Signed)
He will continue Pomalyst single agent Recent myeloma panel showed complete remission He is reminded to take acyclovir for antimicrobial prophylaxis He is reminded to take aspirin for DVT prophylaxis Due to inability to get dental clearance, we will hold off Zometa I recommend resuming primary care doctor's visit for health maintenance issues I will see him every 3 months

## 2018-07-20 NOTE — Progress Notes (Signed)
[facility] OFFICE PROGRESS NOTE  Patient Care Team: [name], MD as PCP - General (Internal Medicine)  ASSESSMENT & PLAN:  Multiple myeloma not having achieved remission [facility]) He will continue Pomalyst single agent Recent myeloma panel showed complete remission He is reminded to take acyclovir for antimicrobial prophylaxis He is reminded to take aspirin for DVT prophylaxis Due to inability to get dental clearance, we will hold off Zometa I recommend resuming primary care doctor's visit for health maintenance issues I will see him every 3 months  Anemia in neoplastic disease This is likely anemia of chronic disease. The patient denies recent history of bleeding such as epistaxis, hematuria or hematochezia. He is asymptomatic from the anemia. We will observe for now.  He does not require transfusion now. I do not recommend any further work-up at this time.    Chronic back pain greater than 3 months duration Since last time I saw him, he has not been complaining of back pain anymore He is off all pain medicine He will continue calcium with vitamin D   No orders of the defined types were placed in this encounter.   INTERVAL HISTORY: Please see below for problem oriented charting. He returns for further follow-up He denies new back pain No recent infection, fever or chills He is not able to get dental clearance He has no problems getting his prescription filled and have no new side effects  SUMMARY OF ONCOLOGIC HISTORY: Oncology History  Multiple myeloma not having achieved remission ([place])  06/23/2015 - 06/28/2015 Hospital Admission   The patient was admitted to the hospital due to gait ataxia and back pain. He was subsequently found to have cord compression underwent surgery and was discharged home   06/24/2015 Imaging   Abnormal appearance of the T6 vertebral body, highly suspicious for possible osseous metastasis. Associated pathologic fracture withup to  30% height loss. There is associated abnormal soft tissue density within the ventral epidural space,   06/24/2015 Imaging   MRI lumbar: Focal osseous lesion with abnormal enhancement involving the right pedicle of L3, suspicious for possible osseous metastasisgiven the findings in the thoracic spine. Question additional focal lesion within the right iliac wing as above.     06/25/2015 Pathology Results   Accession: [id number] bone biopsy come from plasma cell neoplasm.   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 Zometa is not given due to inability to get dental clearance   12/14/2015 - 04/24/2016 Chemotherapy   He is started on maintenance treatment with Revlimid only   12/18/2015 Imaging   MRI thoracic and lumbar spine showed numerous enhancing foci throughout the thoracic and lumbar spine with several new small foci in the lumbar spine in comparison with prior MRI compatible with metastatic disease. Stable loss of height of the T3, T4, and T6 vertebral bodies and new postsurgical changes related to T6 laminectomy. No significant epidural disease or evidence for cord compression. No abnormal enhancement of the spinal cord or cauda equina.   05/02/2016 Bone Marrow Biopsy   Outside bone marrow biopsy showed 20% myeloma involvement   05/16/2016 Procedure   Successful placement of a right internal jugular approach power injectable Port-A-Cath. The catheter is ready for immediate use.  05/21/2016 - 07/03/2016 Chemotherapy   He received Kyprolis, Cytoxan and dexamethasone    07/08/2016 Procedure   Status post CT-guided bone marrow biopsy, with tissue specimen sent to pathology for complete histopathologic  analysis   07/08/2016 Bone Marrow Biopsy   Bone Marrow, Aspirate,Biopsy, and Clot BONE MARROW: - MILDLY HYPERCELLULAR MARROW (60%) WITH PLASMA CELL NEOPLASM - SEE COMMENT PERIPHERAL BLOOD: - NORMOCYTIC ANEMIA Diagnosis Note The marrow is hypercellular with lambda-restricted plasma cells consistent with persistence of the patient's previously diagnosed plasma cell neoplasm. The plasma cells comprise approximately 10-15% of the total marrow cellularity, are enlarged, and arranged in clusters.   08/14/2016 Miscellaneous   He received conditioning treatment with melphalan   08/15/2016 Bone Marrow Transplant   He received autologous stem cell transplant   08/24/2016 - 08/29/2016 Hospital Admission   His post-transplant course was complicated by E-Coli bacteremia   11/26/2016 PET scan   PET CT at [facility] 1. Technically limited study due to soft tissue uptake. 2. New hypermetabolic uptake at C7 spinous process and left proximal femur that is of questionable significance in absence of underlying CT correlate. Further assessment with whole body bone scan may be considered. 2. Redemonstrated nonhypermetabolic multifocal lucent and sclerotic lesions throughout the spine which are similar to prior.    12/02/2016 Bone Marrow Biopsy   He had repeat bone marrow biopsy at [facility] An immunohistochemical stain for CD138 is performed on the bone marrow core biopsy demonstrates increased plasma cells with focal clustering (10-20% overall), which are monotypic for lambda light chain by in situ hybridization.   01/06/2017 - 06/09/2017 Chemotherapy   He received weekly Dexamethasone, Pomalyst days 1-21 and Daratumumab. From 06/08/17 onwards, he is placed on maintenance Pomalyst only   07/23/2017 Procedure   Successful right IJ vein Port-A-Cath explant.     REVIEW OF SYSTEMS:   Constitutional: Denies fevers, chills or abnormal weight loss Eyes: Denies blurriness of vision Ears, nose, mouth, throat, and face:  Denies mucositis or sore throat Respiratory: Denies cough, dyspnea or wheezes Cardiovascular: Denies palpitation, chest discomfort or lower extremity swelling Gastrointestinal:  Denies nausea, heartburn or change in bowel habits Skin: Denies abnormal skin rashes Lymphatics: Denies new lymphadenopathy or easy bruising Neurological:Denies numbness, tingling or new weaknesses Behavioral/Psych: Mood is stable, no new changes  All other systems were reviewed with the patient and are negative.  I have reviewed the past medical history, past surgical history, social history and family history with the patient and they are unchanged from previous note.  ALLERGIES:  has No Known Allergies.  MEDICATIONS:  Current Outpatient Medications  Medication Sig Dispense Refill  . acyclovir (ZOVIRAX) 400 MG tablet TAKE 1 TABLET(400 MG) BY MOUTH TWICE DAILY 60 tablet 11  . aspirin EC 81 MG tablet Take 81 mg by mouth daily.    Marland Kitchen ibuprofen (ADVIL,MOTRIN) 800 MG tablet Take 800 mg by mouth every 8 (eight) hours as needed (pain).    . pomalidomide (POMALYST) 2 MG capsule Take 1 capsule (2 mg total) by mouth daily. Take with water for 21 days on,7 days off. Repeat every 28 days. 21 capsule 11   No current facility-administered medications for this visit.     PHYSICAL EXAMINATION: ECOG PERFORMANCE STATUS: 1 - Symptomatic but completely ambulatory  Vitals:   07/20/18 0913  BP: (!) 142/83  Pulse: 75  Resp: 18  Temp: 98.3 F (36.8 C)  SpO2: 100%   Filed Weights   07/20/18 0913  Weight: 237 lb 12.8 oz (107.9 kg)  GENERAL:alert, no distress and comfortable SKIN: skin color, texture, turgor are normal, no rashes or significant lesions EYES: normal, Conjunctiva are pink and non-injected, sclera clear OROPHARYNX:no exudate, no erythema and lips, buccal mucosa, and tongue normal  NECK: supple, thyroid normal size, non-tender, without nodularity LYMPH:  no palpable lymphadenopathy in the cervical,  axillary or inguinal LUNGS: clear to auscultation and percussion with normal breathing effort HEART: regular rate & rhythm and no murmurs and no lower extremity edema ABDOMEN:abdomen soft, non-tender and normal bowel sounds Musculoskeletal:no cyanosis of digits and no clubbing  NEURO: alert & oriented x 3 with fluent speech, no focal motor/sensory deficits  LABORATORY DATA:  I have reviewed the data as listed    Component Value Date/Time   NA 139 07/13/2018 0823   NA 139 01/27/2017 0809   K 3.5 07/13/2018 0823   K 4.1 01/27/2017 0809   CL 107 07/13/2018 0823   CO2 24 07/13/2018 0823   CO2 23 01/27/2017 0809   GLUCOSE 96 07/13/2018 0823   GLUCOSE 122 01/27/2017 0809   BUN 23 (H) 07/13/2018 0823   BUN 14.3 01/27/2017 0809   CREATININE 1.09 07/13/2018 0823   CREATININE 0.9 01/27/2017 0809   CALCIUM 9.1 07/13/2018 0823   CALCIUM 9.1 01/27/2017 0809   PROT 7.0 07/13/2018 0823   PROT 6.2 (L) 01/27/2017 0809   ALBUMIN 3.7 07/13/2018 0823   ALBUMIN 3.7 01/27/2017 0809   AST 17 07/13/2018 0823   AST 10 01/27/2017 0809   ALT 23 07/13/2018 0823   ALT 18 01/27/2017 0809   ALKPHOS 44 07/13/2018 0823   ALKPHOS 42 01/27/2017 0809   BILITOT 0.5 07/13/2018 0823   BILITOT 0.72 01/27/2017 0809   GFRNONAA >60 07/13/2018 0823   GFRAA >60 07/13/2018 0823    No results found for: SPEP, UPEP  Lab Results  Component Value Date   WBC 4.4 07/13/2018   NEUTROABS 2.2 07/13/2018   HGB 12.9 (L) 07/13/2018   HCT 37.8 (L) 07/13/2018   MCV 92.9 07/13/2018   PLT 189 07/13/2018      Chemistry      Component Value Date/Time   NA 139 07/13/2018 0823   NA 139 01/27/2017 0809   K 3.5 07/13/2018 0823   K 4.1 01/27/2017 0809   CL 107 07/13/2018 0823   CO2 24 07/13/2018 0823   CO2 23 01/27/2017 0809   BUN 23 (H) 07/13/2018 0823   BUN 14.3 01/27/2017 0809   CREATININE 1.09 07/13/2018 0823   CREATININE 0.9 01/27/2017 0809      Component Value Date/Time   CALCIUM 9.1 07/13/2018 0823    CALCIUM 9.1 01/27/2017 0809   ALKPHOS 44 07/13/2018 0823   ALKPHOS 42 01/27/2017 0809   AST 17 07/13/2018 0823   AST 10 01/27/2017 0809   ALT 23 07/13/2018 0823   ALT 18 01/27/2017 0809   BILITOT 0.5 07/13/2018 0823   BILITOT 0.72 01/27/2017 0809      All questions were answered. The patient knows to call the clinic with any problems, questions or concerns. No barriers to learning was detected.  I spent 15 minutes counseling the patient face to face. The total time spent in the appointment was 20 minutes and more than 50% was on counseling and review of test results  [name], MD 07/20/2018 10:22 AM

## 2018-07-20 NOTE — Assessment & Plan Note (Signed)
Since last time I saw him, he has not been complaining of back pain anymore He is off all pain medicine He will continue calcium with vitamin D

## 2018-07-20 NOTE — Assessment & Plan Note (Signed)
This is likely anemia of chronic disease. The patient denies recent history of bleeding such as epistaxis, hematuria or hematochezia. He is asymptomatic from the anemia. We will observe for now.  He does not require transfusion now. I do not recommend any further work-up at this time.   

## 2018-07-21 ENCOUNTER — Telehealth: Payer: Self-pay | Admitting: Hematology and Oncology

## 2018-07-21 NOTE — Telephone Encounter (Signed)
I talk with patient regarding schedule  

## 2018-07-29 ENCOUNTER — Telehealth: Payer: Self-pay

## 2018-07-29 ENCOUNTER — Other Ambulatory Visit: Payer: Self-pay

## 2018-07-29 DIAGNOSIS — C9000 Multiple myeloma not having achieved remission: Secondary | ICD-10-CM

## 2018-07-29 MED ORDER — POMALIDOMIDE 2 MG PO CAPS: 2.0000 mg | ORAL_CAPSULE | Freq: Every day | ORAL | 11 refills | Status: DC

## 2018-07-29 NOTE — Telephone Encounter (Signed)
He called and left a message asking for refill on Pomalyst. Rx refill sent.  Called back and told him Rx sent. He verbalized understanding.

## 2018-08-13 ENCOUNTER — Other Ambulatory Visit: Payer: Self-pay | Admitting: *Deleted

## 2018-08-13 DIAGNOSIS — C9000 Multiple myeloma not having achieved remission: Secondary | ICD-10-CM

## 2018-08-13 MED ORDER — POMALIDOMIDE 2 MG PO CAPS: 2.0000 mg | ORAL_CAPSULE | Freq: Every day | ORAL | 11 refills | Status: DC

## 2018-09-18 ENCOUNTER — Telehealth: Payer: Self-pay | Admitting: *Deleted

## 2018-09-18 NOTE — Telephone Encounter (Signed)
Received a phone call from Darrold Junker, NP (pager [phone]) from [facility]. Patient was seen this morning with concerns of extreme fatigue. They recommend decreasing Pomalyst to 1mg  for the 21 days in his 28 day cycle. Patient was offered a BM Biopsy and has declined. He would like to continue pomalyst at reduced dose.

## 2018-09-18 NOTE — Telephone Encounter (Signed)
I disagree I will discuss with him in his next visit, unless if he wants to meet sooner

## 2018-09-18 NOTE — Telephone Encounter (Signed)
Patient agrees to maintain same dose/schedule.   Patient stated we needed to call his dentist about the clearance to start Zometa. His dentist is not familiar with this medication and did not know what we would be looking for. He will call this office back to provide dentist contact information. This RN will call dentist to explain need for dental clearance.

## 2018-09-22 ENCOUNTER — Other Ambulatory Visit: Payer: Self-pay

## 2018-09-22 DIAGNOSIS — C9000 Multiple myeloma not having achieved remission: Secondary | ICD-10-CM

## 2018-09-22 MED ORDER — POMALIDOMIDE 2 MG PO CAPS: 2.0000 mg | ORAL_CAPSULE | Freq: Every day | ORAL | 11 refills | Status: DC

## 2018-10-15 ENCOUNTER — Other Ambulatory Visit: Payer: Medicare Other

## 2018-10-19 ENCOUNTER — Inpatient Hospital Stay: Payer: Medicare Other | Attending: Hematology and Oncology

## 2018-10-19 ENCOUNTER — Other Ambulatory Visit: Payer: Self-pay

## 2018-10-19 DIAGNOSIS — M549 Dorsalgia, unspecified: Secondary | ICD-10-CM | POA: Insufficient documentation

## 2018-10-19 DIAGNOSIS — C9000 Multiple myeloma not having achieved remission: Secondary | ICD-10-CM | POA: Diagnosis not present

## 2018-10-19 DIAGNOSIS — G8929 Other chronic pain: Secondary | ICD-10-CM | POA: Diagnosis not present

## 2018-10-19 DIAGNOSIS — D61818 Other pancytopenia: Secondary | ICD-10-CM | POA: Diagnosis not present

## 2018-10-19 LAB — CBC WITH DIFFERENTIAL/PLATELET
Abs Immature Granulocytes: 0.01 10*3/uL (ref 0.00–0.07)
Basophils Absolute: 0.1 10*3/uL (ref 0.0–0.1)
Basophils Relative: 2 %
Eosinophils Absolute: 0.4 10*3/uL (ref 0.0–0.5)
Eosinophils Relative: 10 %
HCT: 40.1 % (ref 39.0–52.0)
Hemoglobin: 13.4 g/dL (ref 13.0–17.0)
Immature Granulocytes: 0 %
Lymphocytes Relative: 44 %
Lymphs Abs: 1.8 10*3/uL (ref 0.7–4.0)
MCH: 31 pg (ref 26.0–34.0)
MCHC: 33.4 g/dL (ref 30.0–36.0)
MCV: 92.8 fL (ref 80.0–100.0)
Monocytes Absolute: 0.6 10*3/uL (ref 0.1–1.0)
Monocytes Relative: 14 %
Neutro Abs: 1.2 10*3/uL — ABNORMAL LOW (ref 1.7–7.7)
Neutrophils Relative %: 30 %
Platelets: 147 10*3/uL — ABNORMAL LOW (ref 150–400)
RBC: 4.32 MIL/uL (ref 4.22–5.81)
RDW: 12.8 % (ref 11.5–15.5)
WBC: 4 10*3/uL (ref 4.0–10.5)
nRBC: 0 % (ref 0.0–0.2)

## 2018-10-19 LAB — COMPREHENSIVE METABOLIC PANEL
ALT: 19 U/L (ref 0–44)
AST: 15 U/L (ref 15–41)
Albumin: 4.1 g/dL (ref 3.5–5.0)
Alkaline Phosphatase: 44 U/L (ref 38–126)
Anion gap: 8 (ref 5–15)
BUN: 14 mg/dL (ref 6–20)
CO2: 28 mmol/L (ref 22–32)
Calcium: 9.4 mg/dL (ref 8.9–10.3)
Chloride: 106 mmol/L (ref 98–111)
Creatinine, Ser: 1.1 mg/dL (ref 0.61–1.24)
GFR calc Af Amer: >60 mL/min (ref >60)
GFR calc non Af Amer: >60 mL/min (ref >60)
Glucose, Bld: 90 mg/dL (ref 70–99)
Potassium: 3.7 mmol/L (ref 3.5–5.1)
Sodium: 142 mmol/L (ref 135–145)
Total Bilirubin: 0.8 mg/dL (ref 0.3–1.2)
Total Protein: 7.3 g/dL (ref 6.5–8.1)

## 2018-10-20 LAB — KAPPA/LAMBDA LIGHT CHAINS
Kappa free light chain: 35.6 mg/L — ABNORMAL HIGH (ref 3.3–19.4)
Kappa, lambda light chain ratio: 1.43 (ref 0.26–1.65)
Lambda free light chains: 24.9 mg/L (ref 5.7–26.3)

## 2018-10-21 ENCOUNTER — Telehealth: Payer: Self-pay

## 2018-10-21 LAB — MULTIPLE MYELOMA PANEL, SERUM
Albumin SerPl Elph-Mcnc: 4.1 g/dL (ref 2.9–4.4)
Albumin/Glob SerPl: 1.3 (ref 0.7–1.7)
Alpha 1: 0.1 g/dL (ref 0.0–0.4)
Alpha2 Glob SerPl Elph-Mcnc: 0.7 g/dL (ref 0.4–1.0)
B-Globulin SerPl Elph-Mcnc: 0.8 g/dL (ref 0.7–1.3)
Gamma Glob SerPl Elph-Mcnc: 1.5 g/dL (ref 0.4–1.8)
Globulin, Total: 3.2 g/dL (ref 2.2–3.9)
IgA: 91 mg/dL (ref 90–386)
IgG (Immunoglobin G), Serum: 1586 mg/dL (ref 603–1613)
IgM (Immunoglobulin M), Srm: 29 mg/dL (ref 20–172)
Total Protein ELP: 7.3 g/dL (ref 6.0–8.5)

## 2018-10-21 NOTE — Telephone Encounter (Signed)
Oral Oncology Patient Advocate Encounter  Pomlayst patient assistance application will expire 01/28/19.  I called the patient and went over this with him. He will be coming in on 9/28 and I will have him sign the application while he is in the office. He will also bring in his 2019 tax return to send with the application.  I will not fax this renewal application until the renewal period has begun but I need to start getting them ready.  The patient verbalized understanding and great appreciation.  [place] Patient [place] Phone [phone] Fax [phone] 10/21/2018   2:03 PM

## 2018-10-22 ENCOUNTER — Ambulatory Visit: Payer: Medicare Other | Admitting: Hematology and Oncology

## 2018-10-23 ENCOUNTER — Other Ambulatory Visit: Payer: Self-pay

## 2018-10-23 DIAGNOSIS — C9000 Multiple myeloma not having achieved remission: Secondary | ICD-10-CM

## 2018-10-23 MED ORDER — POMALIDOMIDE 2 MG PO CAPS: 2.0000 mg | ORAL_CAPSULE | Freq: Every day | ORAL | 11 refills | Status: DC

## 2018-10-26 ENCOUNTER — Inpatient Hospital Stay (HOSPITAL_BASED_OUTPATIENT_CLINIC_OR_DEPARTMENT_OTHER): Payer: Medicare Other | Admitting: Hematology and Oncology

## 2018-10-26 ENCOUNTER — Encounter: Payer: Self-pay | Admitting: Hematology and Oncology

## 2018-10-26 ENCOUNTER — Other Ambulatory Visit: Payer: Self-pay

## 2018-10-26 DIAGNOSIS — M549 Dorsalgia, unspecified: Secondary | ICD-10-CM

## 2018-10-26 DIAGNOSIS — G8929 Other chronic pain: Secondary | ICD-10-CM | POA: Diagnosis not present

## 2018-10-26 DIAGNOSIS — C9000 Multiple myeloma not having achieved remission: Secondary | ICD-10-CM

## 2018-10-26 DIAGNOSIS — D61818 Other pancytopenia: Secondary | ICD-10-CM | POA: Diagnosis not present

## 2018-10-26 NOTE — Assessment & Plan Note (Signed)
He will continue Pomalyst single agent Recent myeloma panel showed complete remission He is reminded to take aspirin for DVT prophylaxis Due to inability to get dental clearance, we will hold off Zometa I recommend resuming primary care doctor's visit for health maintenance issues I will see him every 3 months I released him back to work because his ongoing treatment should not be a contraindication that he cannot go back to work I gave him a letter I also gave him a letter to get dental clearance from his dentist We discussed influenza vaccination today.  The patient declined influenza vaccination today

## 2018-10-26 NOTE — Assessment & Plan Note (Signed)
He has intermittent mild pancytopenia due to treatment He is not symptomatic Observe only

## 2018-10-26 NOTE — Assessment & Plan Note (Signed)
He continues to have chronic intermittent back pain He is off all pain medicine He will continue calcium with vitamin D

## 2018-10-26 NOTE — Telephone Encounter (Signed)
Oral Oncology Patient Advocate Encounter  I met with the patient while he was in the office today.  He informed me that he does have Medicare part D now. I explained that when we submit the Pomlayst to the insurance we will need to do a prior authorization, once this is approved he will have a high copay and we can get him a grant if there is one available. Once we have all of this taken care of the prescription will go to an outside specialty pharmacy to be filled and shipped to his house at $0 out of pocket cost to him.  I also explained that after the prior authorization has been obtained if there are no grants available then we would use [employer] patient assistance. He signed the patient assistance application just in case we need it later.  I have put him on my calendar to follow up with this again later in the year.  The patient verbalized understanding and great appreciation.  [place] Patient [place] Phone [phone] Fax [phone] 10/26/2018   9:38 AM

## 2018-10-26 NOTE — Progress Notes (Signed)
[facility] OFFICE PROGRESS NOTE  Patient Care Team: [name], MD as PCP - General (Internal Medicine)  ASSESSMENT & PLAN:  Multiple myeloma not having achieved remission [facility]) He will continue Pomalyst single agent Recent myeloma panel showed complete remission He is reminded to take aspirin for DVT prophylaxis Due to inability to get dental clearance, we will hold off Zometa I recommend resuming primary care doctor's visit for health maintenance issues I will see him every 3 months I released him back to work because his ongoing treatment should not be a contraindication that he cannot go back to work I gave him a letter I also gave him a letter to get dental clearance from his dentist We discussed influenza vaccination today.  The patient declined influenza vaccination today  Chronic back pain greater than 3 months duration He continues to have chronic intermittent back pain He is off all pain medicine He will continue calcium with vitamin D  Pancytopenia, acquired ([place]) He has intermittent mild pancytopenia due to treatment He is not symptomatic Observe only   No orders of the defined types were placed in this encounter.   INTERVAL HISTORY: Please see below for problem oriented charting. He returns for further follow-up He feels well No recent infection, fever or chills He has occasional back pain The patient requests a letter for his dentist and work He has no recent dental issues  SUMMARY OF ONCOLOGIC HISTORY: Oncology History  Multiple myeloma not having achieved remission ([place])  06/23/2015 - 06/28/2015 Hospital Admission   The patient was admitted to the hospital due to gait ataxia and back pain. He was subsequently found to have cord compression underwent surgery and was discharged home   06/24/2015 Imaging   Abnormal appearance of the T6 vertebral body, highly suspicious for possible osseous metastasis. Associated pathologic fracture  withup to 30% height loss. There is associated abnormal soft tissue density within the ventral epidural space,   06/24/2015 Imaging   MRI lumbar: Focal osseous lesion with abnormal enhancement involving the right pedicle of L3, suspicious for possible osseous metastasisgiven the findings in the thoracic spine. Question additional focal lesion within the right iliac wing as above.     06/25/2015 Pathology Results   Accession: [id number] bone biopsy come from plasma cell neoplasm.   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 Zometa is not given due to inability to get dental clearance   12/14/2015 - 04/24/2016 Chemotherapy   He is started on maintenance treatment with Revlimid only   12/18/2015 Imaging   MRI thoracic and lumbar spine showed numerous enhancing foci throughout the thoracic and lumbar spine with several new small foci in the lumbar spine in comparison with prior MRI compatible with metastatic disease. Stable loss of height of the T3, T4, and T6 vertebral bodies and new postsurgical changes related to T6 laminectomy. No significant epidural disease or evidence for cord compression. No abnormal enhancement of the spinal cord or cauda equina.   05/02/2016 Bone Marrow Biopsy   Outside bone marrow biopsy showed 20% myeloma involvement   05/16/2016 Procedure   Successful placement of a right internal jugular approach power injectable Port-A-Cath. The catheter is ready for immediate use.   05/21/2016 - 07/03/2016  Chemotherapy   He received Kyprolis, Cytoxan and dexamethasone    07/08/2016 Procedure   Status post CT-guided bone marrow biopsy, with tissue specimen sent to pathology for complete  histopathologic analysis   07/08/2016 Bone Marrow Biopsy   Bone Marrow, Aspirate,Biopsy, and Clot BONE MARROW: - MILDLY HYPERCELLULAR MARROW (60%) WITH PLASMA CELL NEOPLASM - SEE COMMENT PERIPHERAL BLOOD: - NORMOCYTIC ANEMIA Diagnosis Note The marrow is hypercellular with lambda-restricted plasma cells consistent with persistence of the patient's previously diagnosed plasma cell neoplasm. The plasma cells comprise approximately 10-15% of the total marrow cellularity, are enlarged, and arranged in clusters.   08/14/2016 Miscellaneous   He received conditioning treatment with melphalan   08/15/2016 Bone Marrow Transplant   He received autologous stem cell transplant   08/24/2016 - 08/29/2016 Hospital Admission   His post-transplant course was complicated by E-Coli bacteremia   11/26/2016 PET scan   PET CT at [facility] 1. Technically limited study due to soft tissue uptake. 2. New hypermetabolic uptake at C7 spinous process and left proximal femur that is of questionable significance in absence of underlying CT correlate. Further assessment with whole body bone scan may be considered. 2. Redemonstrated nonhypermetabolic multifocal lucent and sclerotic lesions throughout the spine which are similar to prior.    12/02/2016 Bone Marrow Biopsy   He had repeat bone marrow biopsy at [facility] An immunohistochemical stain for CD138 is performed on the bone marrow core biopsy demonstrates increased plasma cells with focal clustering (10-20% overall), which are monotypic for lambda light chain by in situ hybridization.   01/06/2017 - 06/09/2017 Chemotherapy   He received weekly Dexamethasone, Pomalyst days 1-21 and Daratumumab. From 06/08/17 onwards, he is placed on maintenance Pomalyst only   07/23/2017 Procedure   Successful right IJ vein Port-A-Cath explant.     REVIEW OF SYSTEMS:   Constitutional: Denies fevers, chills or abnormal weight loss Eyes: Denies blurriness of vision Ears, nose, mouth,  throat, and face: Denies mucositis or sore throat Respiratory: Denies cough, dyspnea or wheezes Cardiovascular: Denies palpitation, chest discomfort or lower extremity swelling Gastrointestinal:  Denies nausea, heartburn or change in bowel habits Skin: Denies abnormal skin rashes Lymphatics: Denies new lymphadenopathy or easy bruising Neurological:Denies numbness, tingling or new weaknesses Behavioral/Psych: Mood is stable, no new changes  All other systems were reviewed with the patient and are negative.  I have reviewed the past medical history, past surgical history, social history and family history with the patient and they are unchanged from previous note.  ALLERGIES:  has No Known Allergies.  MEDICATIONS:  Current Outpatient Medications  Medication Sig Dispense Refill  . acyclovir (ZOVIRAX) 400 MG tablet TAKE 1 TABLET(400 MG) BY MOUTH TWICE DAILY 60 tablet 11  . aspirin EC 81 MG tablet Take 81 mg by mouth daily.    Marland Kitchen ibuprofen (ADVIL,MOTRIN) 800 MG tablet Take 800 mg by mouth every 8 (eight) hours as needed (pain).    . pomalidomide (POMALYST) 2 MG capsule Take 1 capsule (2 mg total) by mouth daily. Take with water for 21 days on,7 days off. Repeat every 28 days. 21 capsule 11   No current facility-administered medications for this visit.     PHYSICAL EXAMINATION: ECOG PERFORMANCE STATUS: 1 - Symptomatic but completely ambulatory  Vitals:   10/26/18 0850  Pulse: 72  Resp: 18  Temp: 98.5 F (36.9 C)  SpO2: 100%   Filed Weights   10/26/18 0850  Weight: 241 lb (109.3 kg)    GENERAL:alert, no distress and comfortable SKIN: skin color,  texture, turgor are normal, no rashes or significant lesions EYES: normal, Conjunctiva are pink and non-injected, sclera clear OROPHARYNX:no exudate, no erythema and lips, buccal mucosa, and tongue normal  NECK: supple, thyroid normal size, non-tender, without nodularity LYMPH:  no palpable lymphadenopathy in the cervical, axillary or  inguinal LUNGS: clear to auscultation and percussion with normal breathing effort HEART: regular rate & rhythm and no murmurs and no lower extremity edema ABDOMEN:abdomen soft, non-tender and normal bowel sounds Musculoskeletal:no cyanosis of digits and no clubbing  NEURO: alert & oriented x 3 with fluent speech, no focal motor/sensory deficits  LABORATORY DATA:  I have reviewed the data as listed    Component Value Date/Time   NA 142 10/19/2018 1136   NA 139 01/27/2017 0809   K 3.7 10/19/2018 1136   K 4.1 01/27/2017 0809   CL 106 10/19/2018 1136   CO2 28 10/19/2018 1136   CO2 23 01/27/2017 0809   GLUCOSE 90 10/19/2018 1136   GLUCOSE 122 01/27/2017 0809   BUN 14 10/19/2018 1136   BUN 14.3 01/27/2017 0809   CREATININE 1.10 10/19/2018 1136   CREATININE 0.9 01/27/2017 0809   CALCIUM 9.4 10/19/2018 1136   CALCIUM 9.1 01/27/2017 0809   PROT 7.3 10/19/2018 1136   PROT 6.2 (L) 01/27/2017 0809   ALBUMIN 4.1 10/19/2018 1136   ALBUMIN 3.7 01/27/2017 0809   AST 15 10/19/2018 1136   AST 10 01/27/2017 0809   ALT 19 10/19/2018 1136   ALT 18 01/27/2017 0809   ALKPHOS 44 10/19/2018 1136   ALKPHOS 42 01/27/2017 0809   BILITOT 0.8 10/19/2018 1136   BILITOT 0.72 01/27/2017 0809   GFRNONAA >60 10/19/2018 1136   GFRAA >60 10/19/2018 1136    No results found for: SPEP, UPEP  Lab Results  Component Value Date   WBC 4.0 10/19/2018   NEUTROABS 1.2 (L) 10/19/2018   HGB 13.4 10/19/2018   HCT 40.1 10/19/2018   MCV 92.8 10/19/2018   PLT 147 (L) 10/19/2018      Chemistry      Component Value Date/Time   NA 142 10/19/2018 1136   NA 139 01/27/2017 0809   K 3.7 10/19/2018 1136   K 4.1 01/27/2017 0809   CL 106 10/19/2018 1136   CO2 28 10/19/2018 1136   CO2 23 01/27/2017 0809   BUN 14 10/19/2018 1136   BUN 14.3 01/27/2017 0809   CREATININE 1.10 10/19/2018 1136   CREATININE 0.9 01/27/2017 0809      Component Value Date/Time   CALCIUM 9.4 10/19/2018 1136   CALCIUM 9.1 01/27/2017  0809   ALKPHOS 44 10/19/2018 1136   ALKPHOS 42 01/27/2017 0809   AST 15 10/19/2018 1136   AST 10 01/27/2017 0809   ALT 19 10/19/2018 1136   ALT 18 01/27/2017 0809   BILITOT 0.8 10/19/2018 1136   BILITOT 0.72 01/27/2017 0809       All questions were answered. The patient knows to call the clinic with any problems, questions or concerns. No barriers to learning was detected.  I spent 15 minutes counseling the patient face to face. The total time spent in the appointment was 20 minutes and more than 50% was on counseling and review of test results  [name], MD 10/26/2018 9:59 AM

## 2018-10-27 ENCOUNTER — Telehealth: Payer: Self-pay | Admitting: Hematology and Oncology

## 2018-10-27 ENCOUNTER — Other Ambulatory Visit: Payer: Self-pay | Admitting: *Deleted

## 2018-10-27 DIAGNOSIS — C9000 Multiple myeloma not having achieved remission: Secondary | ICD-10-CM

## 2018-10-27 MED ORDER — POMALIDOMIDE 2 MG PO CAPS: 2.0000 mg | ORAL_CAPSULE | Freq: Every day | ORAL | 11 refills | Status: DC

## 2018-10-27 NOTE — Telephone Encounter (Signed)
I left a message regarding schedule  

## 2018-11-16 ENCOUNTER — Telehealth: Payer: Self-pay

## 2018-11-16 NOTE — Telephone Encounter (Signed)
Oral Oncology Patient Advocate Encounter  Received notification from [employer] that prior authorization for Pomlayst is required.  PA submitted on CoverMyMeds Key [id number] Status is pending  Oral Oncology Clinic will continue to follow.  [place] Patient [place] Phone [phone] Fax [phone] 11/16/2018    2:15 PM

## 2018-11-16 NOTE — Telephone Encounter (Signed)
Oral Oncology Patient Advocate Encounter  Prior Authorization for Pomalyst has been approved.    PA# [id number] Effective dates: 11/16/18 through 01/28/20  Oral Oncology Clinic will continue to follow.   [place] Patient [place] Phone [phone] Fax [phone] 11/16/2018    2:26 PM

## 2018-11-16 NOTE — Telephone Encounter (Signed)
Oral Oncology Patient Advocate Encounter  Pomlayst PA has been approved.  Pomlayst copay is $8.12.  He can get Pomlayst from [place] until 01/28/19.  Pomlayst is a limited distribution medication and cannot be filled at [facility]. We will get this sent to an outside pharmacy and let patient know the new plan at that time.  Oral Oncology will continue to follow.  [place] Patient [place] Phone [phone] Fax [phone] 11/16/2018   2:35 PM

## 2018-11-19 ENCOUNTER — Other Ambulatory Visit: Payer: Self-pay | Admitting: *Deleted

## 2018-11-19 DIAGNOSIS — C9000 Multiple myeloma not having achieved remission: Secondary | ICD-10-CM

## 2018-11-19 MED ORDER — POMALIDOMIDE 2 MG PO CAPS: 2.0000 mg | ORAL_CAPSULE | Freq: Every day | ORAL | 11 refills | Status: DC

## 2018-12-21 ENCOUNTER — Other Ambulatory Visit: Payer: Self-pay

## 2018-12-21 DIAGNOSIS — C9000 Multiple myeloma not having achieved remission: Secondary | ICD-10-CM

## 2018-12-21 MED ORDER — POMALIDOMIDE 2 MG PO CAPS: 2.0000 mg | ORAL_CAPSULE | Freq: Every day | ORAL | 11 refills | Status: DC

## 2018-12-22 ENCOUNTER — Telehealth: Payer: Self-pay

## 2018-12-22 NOTE — Telephone Encounter (Signed)
He called and ask if Dr. Alvy Bimler could send a Rx to [facility] for shingles vaccine. He tried to get the vaccine and they said he has to have a Rx sent to the pharmacy.

## 2018-12-23 NOTE — Telephone Encounter (Signed)
Spoke with pt by phone and informed him of below.   He verbalizes understanding.

## 2018-12-23 NOTE — Telephone Encounter (Signed)
No He needs to contact his primary care for this

## 2019-01-07 ENCOUNTER — Other Ambulatory Visit: Payer: Self-pay | Admitting: *Deleted

## 2019-01-07 DIAGNOSIS — C9000 Multiple myeloma not having achieved remission: Secondary | ICD-10-CM

## 2019-01-07 MED ORDER — POMALIDOMIDE 2 MG PO CAPS: 2.0000 mg | ORAL_CAPSULE | Freq: Every day | ORAL | 11 refills | Status: DC

## 2019-01-18 ENCOUNTER — Inpatient Hospital Stay: Payer: Medicare Other | Attending: Hematology and Oncology

## 2019-01-18 ENCOUNTER — Other Ambulatory Visit: Payer: Self-pay

## 2019-01-18 DIAGNOSIS — Z79899 Other long term (current) drug therapy: Secondary | ICD-10-CM | POA: Diagnosis not present

## 2019-01-18 DIAGNOSIS — D61818 Other pancytopenia: Secondary | ICD-10-CM | POA: Diagnosis not present

## 2019-01-18 DIAGNOSIS — C9000 Multiple myeloma not having achieved remission: Secondary | ICD-10-CM | POA: Diagnosis not present

## 2019-01-18 LAB — COMPREHENSIVE METABOLIC PANEL
ALT: 28 U/L (ref 0–44)
AST: 21 U/L (ref 15–41)
Albumin: 3.9 g/dL (ref 3.5–5.0)
Alkaline Phosphatase: 43 U/L (ref 38–126)
Anion gap: 9 (ref 5–15)
BUN: 17 mg/dL (ref 6–20)
CO2: 24 mmol/L (ref 22–32)
Calcium: 9.2 mg/dL (ref 8.9–10.3)
Chloride: 106 mmol/L (ref 98–111)
Creatinine, Ser: 1.09 mg/dL (ref 0.61–1.24)
GFR calc Af Amer: >60 mL/min (ref >60)
GFR calc non Af Amer: >60 mL/min (ref >60)
Glucose, Bld: 103 mg/dL — ABNORMAL HIGH (ref 70–99)
Potassium: 3.9 mmol/L (ref 3.5–5.1)
Sodium: 139 mmol/L (ref 135–145)
Total Bilirubin: 0.6 mg/dL (ref 0.3–1.2)
Total Protein: 7.3 g/dL (ref 6.5–8.1)

## 2019-01-18 LAB — CBC WITH DIFFERENTIAL/PLATELET
Abs Immature Granulocytes: 0.01 10*3/uL (ref 0.00–0.07)
Basophils Absolute: 0.1 10*3/uL (ref 0.0–0.1)
Basophils Relative: 2 %
Eosinophils Absolute: 0.5 10*3/uL (ref 0.0–0.5)
Eosinophils Relative: 14 %
HCT: 39.7 % (ref 39.0–52.0)
Hemoglobin: 13.7 g/dL (ref 13.0–17.0)
Immature Granulocytes: 0 %
Lymphocytes Relative: 40 %
Lymphs Abs: 1.4 10*3/uL (ref 0.7–4.0)
MCH: 31.1 pg (ref 26.0–34.0)
MCHC: 34.5 g/dL (ref 30.0–36.0)
MCV: 90.2 fL (ref 80.0–100.0)
Monocytes Absolute: 0.5 10*3/uL (ref 0.1–1.0)
Monocytes Relative: 14 %
Neutro Abs: 1.1 10*3/uL — ABNORMAL LOW (ref 1.7–7.7)
Neutrophils Relative %: 30 %
Platelets: 137 10*3/uL — ABNORMAL LOW (ref 150–400)
RBC: 4.4 MIL/uL (ref 4.22–5.81)
RDW: 12.6 % (ref 11.5–15.5)
WBC: 3.6 10*3/uL — ABNORMAL LOW (ref 4.0–10.5)
nRBC: 0 % (ref 0.0–0.2)

## 2019-01-19 LAB — KAPPA/LAMBDA LIGHT CHAINS
Kappa free light chain: 28.9 mg/L — ABNORMAL HIGH (ref 3.3–19.4)
Kappa, lambda light chain ratio: 1.13 (ref 0.26–1.65)
Lambda free light chains: 25.6 mg/L (ref 5.7–26.3)

## 2019-01-20 ENCOUNTER — Other Ambulatory Visit: Payer: Self-pay | Admitting: *Deleted

## 2019-01-20 DIAGNOSIS — C9000 Multiple myeloma not having achieved remission: Secondary | ICD-10-CM

## 2019-01-20 LAB — MULTIPLE MYELOMA PANEL, SERUM
Albumin SerPl Elph-Mcnc: 3.9 g/dL (ref 2.9–4.4)
Albumin/Glob SerPl: 1.4 (ref 0.7–1.7)
Alpha 1: 0.2 g/dL (ref 0.0–0.4)
Alpha2 Glob SerPl Elph-Mcnc: 0.6 g/dL (ref 0.4–1.0)
B-Globulin SerPl Elph-Mcnc: 0.9 g/dL (ref 0.7–1.3)
Gamma Glob SerPl Elph-Mcnc: 1.3 g/dL (ref 0.4–1.8)
Globulin, Total: 2.9 g/dL (ref 2.2–3.9)
IgA: 89 mg/dL — ABNORMAL LOW (ref 90–386)
IgG (Immunoglobin G), Serum: 1461 mg/dL (ref 603–1613)
IgM (Immunoglobulin M), Srm: 24 mg/dL (ref 20–172)
Total Protein ELP: 6.8 g/dL (ref 6.0–8.5)

## 2019-01-20 MED ORDER — POMALIDOMIDE 2 MG PO CAPS: 2.0000 mg | ORAL_CAPSULE | Freq: Every day | ORAL | 11 refills | Status: DC

## 2019-01-25 ENCOUNTER — Inpatient Hospital Stay (HOSPITAL_BASED_OUTPATIENT_CLINIC_OR_DEPARTMENT_OTHER): Payer: Medicare Other | Admitting: Hematology and Oncology

## 2019-01-25 ENCOUNTER — Encounter: Payer: Self-pay | Admitting: Hematology and Oncology

## 2019-01-25 ENCOUNTER — Other Ambulatory Visit: Payer: Self-pay

## 2019-01-25 DIAGNOSIS — D61818 Other pancytopenia: Secondary | ICD-10-CM

## 2019-01-25 DIAGNOSIS — C9000 Multiple myeloma not having achieved remission: Secondary | ICD-10-CM

## 2019-01-25 NOTE — Progress Notes (Signed)
[place] OFFICE PROGRESS NOTE  Patient Care Team: Bradley Reach, MD as PCP - General (Internal Medicine)  ASSESSMENT & PLAN:  Multiple myeloma not having achieved remission [facility]) He will continue Pomalyst single agent Recent myeloma panel showed complete remission He is reminded to take aspirin for DVT prophylaxis Due to inability to get dental clearance, we will hold off Zometa I recommend resuming primary care doctor's visit for health maintenance issues I will see him every 3 months I also gave him a letter to get dental clearance from his dentist We discussed influenza vaccination today.  The patient declined influenza vaccination today  Pancytopenia, acquired [facility]) He has intermittent mild pancytopenia due to treatment He is not symptomatic Observe only The patient inquired about long distance travel back to [place] to visit his family Due to COVID-19 travel restrictions and the fact that he is on chemotherapy, I do not recommend him to fly to [place] to visit his family over the next few months   No orders of the defined types were placed in this encounter.   INTERVAL HISTORY: Please see below for problem oriented charting. He returns for further follow-up He denies recent infection, fever or chills He denies any recent new bone pain He is compliant taking all his medications as directed He has not been able to get dental insurance and dental check to get clearance for Zometa  SUMMARY OF ONCOLOGIC HISTORY: Oncology History  Multiple myeloma not having achieved remission ([place])  06/23/2015 - 06/28/2015 Hospital Admission   The patient was admitted to the hospital due to gait ataxia and back pain. He was subsequently found to have cord compression underwent surgery and was discharged home   06/24/2015 Imaging   Abnormal appearance of the T6 vertebral body, highly suspicious for possible osseous metastasis. Associated pathologic fracture withup to 30%  height loss. There is associated abnormal soft tissue density within the ventral epidural space,   06/24/2015 Imaging   MRI lumbar: Focal osseous lesion with abnormal enhancement involving the right pedicle of L3, suspicious for possible osseous metastasisgiven the findings in the thoracic spine. Question additional focal lesion within the right iliac wing as above.     06/25/2015 Pathology Results   Accession: [id number] bone biopsy come from plasma cell neoplasm.   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 Zometa is not given due to inability to get dental clearance   12/14/2015 - 04/24/2016 Chemotherapy   He is started on maintenance treatment with Revlimid only   12/18/2015 Imaging   MRI thoracic and lumbar spine showed numerous enhancing foci throughout the thoracic and lumbar spine with several new small foci in the lumbar spine in comparison with prior MRI compatible with metastatic disease. Stable loss of height of the T3, T4, and T6 vertebral bodies and new postsurgical changes related to T6 laminectomy. No significant epidural disease or evidence for cord compression. No abnormal enhancement of the spinal cord or cauda equina.   05/02/2016 Bone Marrow Biopsy   Outside bone marrow biopsy showed 20% myeloma involvement   05/16/2016 Procedure   Successful placement of a right internal jugular approach power injectable Port-A-Cath. The catheter is ready for immediate use.   05/21/2016 - 07/03/2016 Chemotherapy  He received Kyprolis, Cytoxan and dexamethasone    07/08/2016 Procedure   Status post CT-guided bone marrow biopsy, with tissue specimen sent to pathology for complete histopathologic analysis    07/08/2016 Bone Marrow Biopsy   Bone Marrow, Aspirate,Biopsy, and Clot BONE MARROW: - MILDLY HYPERCELLULAR MARROW (60%) WITH PLASMA CELL NEOPLASM - SEE COMMENT PERIPHERAL BLOOD: - NORMOCYTIC ANEMIA Diagnosis Note The marrow is hypercellular with lambda-restricted plasma cells consistent with persistence of the patient's previously diagnosed plasma cell neoplasm. The plasma cells comprise approximately 10-15% of the total marrow cellularity, are enlarged, and arranged in clusters.   08/14/2016 Miscellaneous   He received conditioning treatment with melphalan   08/15/2016 Bone Marrow Transplant   He received autologous stem cell transplant   08/24/2016 - 08/29/2016 Hospital Admission   His post-transplant course was complicated by E-Coli bacteremia   11/26/2016 PET scan   PET CT at [facility] 1. Technically limited study due to soft tissue uptake. 2. New hypermetabolic uptake at C7 spinous process and left proximal femur that is of questionable significance in absence of underlying CT correlate. Further assessment with whole body bone scan may be considered. 2. Redemonstrated nonhypermetabolic multifocal lucent and sclerotic lesions throughout the spine which are similar to prior.    12/02/2016 Bone Marrow Biopsy   He had repeat bone marrow biopsy at [facility] An immunohistochemical stain for CD138 is performed on the bone marrow core biopsy demonstrates increased plasma cells with focal clustering (10-20% overall), which are monotypic for lambda light chain by in situ hybridization.   01/06/2017 - 06/09/2017 Chemotherapy   He received weekly Dexamethasone, Pomalyst days 1-21 and Daratumumab. From 06/08/17 onwards, he is placed on maintenance Pomalyst only   07/23/2017 Procedure   Successful right IJ vein Port-A-Cath explant.     REVIEW OF SYSTEMS:   Constitutional: Denies fevers, chills or abnormal weight loss Eyes: Denies blurriness of vision Ears, nose, mouth, throat, and face: Denies  mucositis or sore throat Respiratory: Denies cough, dyspnea or wheezes Cardiovascular: Denies palpitation, chest discomfort or lower extremity swelling Gastrointestinal:  Denies nausea, heartburn or change in bowel habits Skin: Denies abnormal skin rashes Lymphatics: Denies new lymphadenopathy or easy bruising Neurological:Denies numbness, tingling or new weaknesses Behavioral/Psych: Mood is stable, no new changes  All other systems were reviewed with the patient and are negative.  I have reviewed the past medical history, past surgical history, social history and family history with the patient and they are unchanged from previous note.  ALLERGIES:  has No Known Allergies.  MEDICATIONS:  Current Outpatient Medications  Medication Sig Dispense Refill  . calcium carbonate (TUMS - DOSED IN MG ELEMENTAL CALCIUM) 500 MG chewable tablet Chew 1 tablet by mouth 3 (three) times daily.    . cholecalciferol (VITAMIN D3) 25 MCG (1000 UT) tablet Take 1,000 Units by mouth daily.    Marland Kitchen acyclovir (ZOVIRAX) 400 MG tablet TAKE 1 TABLET(400 MG) BY MOUTH TWICE DAILY 60 tablet 11  . aspirin EC 81 MG tablet Take 81 mg by mouth daily.    Marland Kitchen ibuprofen (ADVIL,MOTRIN) 800 MG tablet Take 800 mg by mouth every 8 (eight) hours as needed (pain).    . pomalidomide (POMALYST) 2 MG capsule Take 1 capsule (2 mg total) by mouth daily. Take with water for 21 days on,7 days off. Repeat every 28 days. 21 capsule 11   No current facility-administered medications for this visit.    PHYSICAL EXAMINATION: ECOG PERFORMANCE STATUS: 1 - Symptomatic but completely ambulatory  Vitals:   01/25/19 2440  BP: (!) 154/96  Pulse: 68  Resp: 18  Temp: 98.3 F (36.8 C)  SpO2: 100%   Filed Weights   01/25/19 0909  Weight: 245 lb 9.6 oz (111.4 kg)    GENERAL:alert, no distress and comfortable SKIN: skin color, texture, turgor are normal, no rashes or significant lesions EYES: normal, Conjunctiva are pink and non-injected, sclera  clear OROPHARYNX:no exudate, no erythema and lips, buccal mucosa, and tongue normal  NECK: supple, thyroid normal size, non-tender, without nodularity LYMPH:  no palpable lymphadenopathy in the cervical, axillary or inguinal LUNGS: clear to auscultation and percussion with normal breathing effort HEART: regular rate & rhythm and no murmurs and no lower extremity edema ABDOMEN:abdomen soft, non-tender and normal bowel sounds Musculoskeletal:no cyanosis of digits and no clubbing  NEURO: alert & oriented x 3 with fluent speech, no focal motor/sensory deficits  LABORATORY DATA:  I have reviewed the data as listed    Component Value Date/Time   NA 139 01/18/2019 0810   NA 139 01/27/2017 0809   K 3.9 01/18/2019 0810   K 4.1 01/27/2017 0809   CL 106 01/18/2019 0810   CO2 24 01/18/2019 0810   CO2 23 01/27/2017 0809   GLUCOSE 103 (H) 01/18/2019 0810   GLUCOSE 122 01/27/2017 0809   BUN 17 01/18/2019 0810   BUN 14.3 01/27/2017 0809   CREATININE 1.09 01/18/2019 0810   CREATININE 0.9 01/27/2017 0809   CALCIUM 9.2 01/18/2019 0810   CALCIUM 9.1 01/27/2017 0809   PROT 7.3 01/18/2019 0810   PROT 6.2 (L) 01/27/2017 0809   ALBUMIN 3.9 01/18/2019 0810   ALBUMIN 3.7 01/27/2017 0809   AST 21 01/18/2019 0810   AST 10 01/27/2017 0809   ALT 28 01/18/2019 0810   ALT 18 01/27/2017 0809   ALKPHOS 43 01/18/2019 0810   ALKPHOS 42 01/27/2017 0809   BILITOT 0.6 01/18/2019 0810   BILITOT 0.72 01/27/2017 0809   GFRNONAA >60 01/18/2019 0810   GFRAA >60 01/18/2019 0810    No results found for: SPEP, UPEP  Lab Results  Component Value Date   WBC 3.6 (L) 01/18/2019   NEUTROABS 1.1 (L) 01/18/2019   HGB 13.7 01/18/2019   HCT 39.7 01/18/2019   MCV 90.2 01/18/2019   PLT 137 (L) 01/18/2019      Chemistry      Component Value Date/Time   NA 139 01/18/2019 0810   NA 139 01/27/2017 0809   K 3.9 01/18/2019 0810   K 4.1 01/27/2017 0809   CL 106 01/18/2019 0810   CO2 24 01/18/2019 0810   CO2 23  01/27/2017 0809   BUN 17 01/18/2019 0810   BUN 14.3 01/27/2017 0809   CREATININE 1.09 01/18/2019 0810   CREATININE 0.9 01/27/2017 0809      Component Value Date/Time   CALCIUM 9.2 01/18/2019 0810   CALCIUM 9.1 01/27/2017 0809   ALKPHOS 43 01/18/2019 0810   ALKPHOS 42 01/27/2017 0809   AST 21 01/18/2019 0810   AST 10 01/27/2017 0809   ALT 28 01/18/2019 0810   ALT 18 01/27/2017 0809   BILITOT 0.6 01/18/2019 0810   BILITOT 0.72 01/27/2017 0809      All questions were answered. The patient knows to call the clinic with any problems, questions or concerns. No barriers to learning was detected.  I spent 15 minutes counseling the patient face to face. The total time spent in the appointment was 20 minutes and more than 50% was on counseling and review of test results  Bradley Hunt  Alvy Bimler, MD 01/25/2019 10:30 AM

## 2019-01-25 NOTE — Assessment & Plan Note (Signed)
He will continue Pomalyst single agent Recent myeloma panel showed complete remission He is reminded to take aspirin for DVT prophylaxis Due to inability to get dental clearance, we will hold off Zometa I recommend resuming primary care doctor's visit for health maintenance issues I will see him every 3 months I also gave him a letter to get dental clearance from his dentist We discussed influenza vaccination today.  The patient declined influenza vaccination today

## 2019-01-25 NOTE — Assessment & Plan Note (Signed)
He has intermittent mild pancytopenia due to treatment He is not symptomatic Observe only The patient inquired about long distance travel back to [place] to visit his family Due to COVID-19 travel restrictions and the fact that he is on chemotherapy, I do not recommend him to fly to [place] to visit his family over the next few months

## 2019-01-26 ENCOUNTER — Telehealth: Payer: Self-pay | Admitting: Hematology and Oncology

## 2019-01-26 NOTE — Telephone Encounter (Signed)
Scheduled apt per 12/29 sch message - mailed reminder letter with appt date and time

## 2019-02-01 ENCOUNTER — Other Ambulatory Visit: Payer: Self-pay | Admitting: *Deleted

## 2019-02-01 DIAGNOSIS — C9000 Multiple myeloma not having achieved remission: Secondary | ICD-10-CM

## 2019-02-01 MED ORDER — POMALIDOMIDE 2 MG PO CAPS: 2.0000 mg | ORAL_CAPSULE | Freq: Every day | ORAL | 11 refills | Status: DC

## 2019-02-08 ENCOUNTER — Other Ambulatory Visit: Payer: Self-pay

## 2019-02-08 DIAGNOSIS — C9000 Multiple myeloma not having achieved remission: Secondary | ICD-10-CM

## 2019-02-08 MED ORDER — POMALIDOMIDE 2 MG PO CAPS: 2.0000 mg | ORAL_CAPSULE | Freq: Every day | ORAL | 11 refills | Status: DC

## 2019-02-19 ENCOUNTER — Telehealth: Payer: Self-pay | Admitting: *Deleted

## 2019-02-19 NOTE — Telephone Encounter (Signed)
Patient called to report he is traveling outside the [place]. He will be out of the country until March. He will call this office when he returns home to have a refill of the Pomalyst sent he does not miss the delivery and it is left at his home unattended.   He has enough until Feb 15 and then he has his 7 days off. He will be extending the off week by 7-8 days.

## 2019-03-02 ENCOUNTER — Other Ambulatory Visit: Payer: Self-pay | Admitting: Hematology and Oncology

## 2019-03-02 DIAGNOSIS — C9000 Multiple myeloma not having achieved remission: Secondary | ICD-10-CM

## 2019-03-02 NOTE — Telephone Encounter (Signed)
Pls refill electronically °

## 2019-03-10 ENCOUNTER — Telehealth: Payer: Self-pay

## 2019-03-10 ENCOUNTER — Other Ambulatory Visit: Payer: Self-pay | Admitting: Hematology and Oncology

## 2019-03-10 DIAGNOSIS — C9000 Multiple myeloma not having achieved remission: Secondary | ICD-10-CM

## 2019-03-10 NOTE — Telephone Encounter (Signed)
TC from [place] at [place] stating she could not reach Pt to have Pomalyst delivered. Left Vm message to Apolonio Schneiders to inform her that per previous notes  Pt. Will be out  of the country until march  And if she had any other questions she could return the call to [facility].

## 2019-03-11 NOTE — Telephone Encounter (Signed)
Pls refill electronically °

## 2019-04-19 ENCOUNTER — Other Ambulatory Visit: Payer: Self-pay

## 2019-04-19 ENCOUNTER — Inpatient Hospital Stay: Payer: Medicare Other | Attending: Hematology and Oncology

## 2019-04-19 DIAGNOSIS — C9000 Multiple myeloma not having achieved remission: Secondary | ICD-10-CM

## 2019-04-19 DIAGNOSIS — C9001 Multiple myeloma in remission: Secondary | ICD-10-CM | POA: Insufficient documentation

## 2019-04-19 DIAGNOSIS — Z9221 Personal history of antineoplastic chemotherapy: Secondary | ICD-10-CM | POA: Insufficient documentation

## 2019-04-19 LAB — CBC WITH DIFFERENTIAL/PLATELET
Abs Immature Granulocytes: 0 10*3/uL (ref 0.00–0.07)
Basophils Absolute: 0 10*3/uL (ref 0.0–0.1)
Basophils Relative: 1 %
Eosinophils Absolute: 0.2 10*3/uL (ref 0.0–0.5)
Eosinophils Relative: 5 %
HCT: 40.5 % (ref 39.0–52.0)
Hemoglobin: 13.8 g/dL (ref 13.0–17.0)
Immature Granulocytes: 0 %
Lymphocytes Relative: 41 %
Lymphs Abs: 1.7 10*3/uL (ref 0.7–4.0)
MCH: 30.8 pg (ref 26.0–34.0)
MCHC: 34.1 g/dL (ref 30.0–36.0)
MCV: 90.4 fL (ref 80.0–100.0)
Monocytes Absolute: 0.3 10*3/uL (ref 0.1–1.0)
Monocytes Relative: 7 %
Neutro Abs: 1.9 10*3/uL (ref 1.7–7.7)
Neutrophils Relative %: 46 %
Platelets: 172 10*3/uL (ref 150–400)
RBC: 4.48 MIL/uL (ref 4.22–5.81)
RDW: 12.6 % (ref 11.5–15.5)
WBC: 4.1 10*3/uL (ref 4.0–10.5)
nRBC: 0 % (ref 0.0–0.2)

## 2019-04-19 LAB — COMPREHENSIVE METABOLIC PANEL
ALT: 16 U/L (ref 0–44)
AST: 19 U/L (ref 15–41)
Albumin: 4 g/dL (ref 3.5–5.0)
Alkaline Phosphatase: 46 U/L (ref 38–126)
Anion gap: 8 (ref 5–15)
BUN: 18 mg/dL (ref 6–20)
CO2: 25 mmol/L (ref 22–32)
Calcium: 9.4 mg/dL (ref 8.9–10.3)
Chloride: 107 mmol/L (ref 98–111)
Creatinine, Ser: 1.15 mg/dL (ref 0.61–1.24)
GFR calc Af Amer: >60 mL/min (ref >60)
GFR calc non Af Amer: >60 mL/min (ref >60)
Glucose, Bld: 86 mg/dL (ref 70–99)
Potassium: 3.8 mmol/L (ref 3.5–5.1)
Sodium: 140 mmol/L (ref 135–145)
Total Bilirubin: 0.7 mg/dL (ref 0.3–1.2)
Total Protein: 7.5 g/dL (ref 6.5–8.1)

## 2019-04-20 LAB — KAPPA/LAMBDA LIGHT CHAINS
Kappa free light chain: 17.9 mg/L (ref 3.3–19.4)
Kappa, lambda light chain ratio: 1.36 (ref 0.26–1.65)
Lambda free light chains: 13.2 mg/L (ref 5.7–26.3)

## 2019-04-21 LAB — MULTIPLE MYELOMA PANEL, SERUM
Albumin SerPl Elph-Mcnc: 3.7 g/dL (ref 2.9–4.4)
Albumin/Glob SerPl: 1.2 (ref 0.7–1.7)
Alpha 1: 0.2 g/dL (ref 0.0–0.4)
Alpha2 Glob SerPl Elph-Mcnc: 0.6 g/dL (ref 0.4–1.0)
B-Globulin SerPl Elph-Mcnc: 0.9 g/dL (ref 0.7–1.3)
Gamma Glob SerPl Elph-Mcnc: 1.5 g/dL (ref 0.4–1.8)
Globulin, Total: 3.3 g/dL (ref 2.2–3.9)
IgA: 79 mg/dL — ABNORMAL LOW (ref 90–386)
IgG (Immunoglobin G), Serum: 1584 mg/dL (ref 603–1613)
IgM (Immunoglobulin M), Srm: 26 mg/dL (ref 20–172)
Total Protein ELP: 7 g/dL (ref 6.0–8.5)

## 2019-04-26 ENCOUNTER — Inpatient Hospital Stay (HOSPITAL_BASED_OUTPATIENT_CLINIC_OR_DEPARTMENT_OTHER): Payer: Medicare Other | Admitting: Hematology and Oncology

## 2019-04-26 ENCOUNTER — Other Ambulatory Visit: Payer: Self-pay

## 2019-04-26 ENCOUNTER — Encounter: Payer: Self-pay | Admitting: Hematology and Oncology

## 2019-04-26 DIAGNOSIS — Z7189 Other specified counseling: Secondary | ICD-10-CM | POA: Diagnosis not present

## 2019-04-26 DIAGNOSIS — C9001 Multiple myeloma in remission: Secondary | ICD-10-CM

## 2019-04-26 NOTE — Assessment & Plan Note (Signed)
We discussed goals of care The patient has achieved complete remission for a long time He desires to have family He is aware that multiple myeloma is considered an incurable disease and there is high risk of relapse in his lifetime He is in agreement to continue close monitoring and follow-up every 3 months, indefinitely

## 2019-04-26 NOTE — Progress Notes (Signed)
[place] OFFICE PROGRESS NOTE  Patient Care Team: [name], MD as PCP - General (Internal Medicine)  ASSESSMENT & PLAN:  Multiple myeloma in remission [facility]) He has achieved complete remission Despite being off Pomalyst, his recent myeloma panel was within normal limits We discussed the risk and benefits of discontinuation of Pomalyst As the patient desired to have children, I think is reasonable to take treatment off I recommend close follow-up every 3 months I recommend he continues to take calcium and vitamin D due to history of significant bone fracture   Goals of care, counseling/discussion We discussed goals of care The patient has achieved complete remission for a long time He desires to have family He is aware that multiple myeloma is considered an incurable disease and there is high risk of relapse in his lifetime He is in agreement to continue close monitoring and follow-up every 3 months, indefinitely   No orders of the defined types were placed in this encounter.   All questions were answered. The patient knows to call the clinic with any problems, questions or concerns. The total time spent in the appointment was 20 minutes encounter with patients including review of chart and various tests results, discussions about plan of care and coordination of care plan   [name], MD 04/26/2019 9:34 AM  INTERVAL HISTORY: Please see below for problem oriented charting. He returns for further follow-up The patient has discontinued Pomalyst since February 2021 because he traveled back home to [place] He is doing well No recent infection, fever or chills He continues to have mild fatigue He had received Covid vaccination recently He has a lot of questions about discontinuation of chemotherapy and family planning  SUMMARY OF ONCOLOGIC HISTORY: Oncology History  Multiple myeloma in remission ([place])  06/23/2015 - 06/28/2015 Hospital Admission   The  patient was admitted to the hospital due to gait ataxia and back pain. He was subsequently found to have cord compression underwent surgery and was discharged home   06/24/2015 Imaging   Abnormal appearance of the T6 vertebral body, highly suspicious for possible osseous metastasis. Associated pathologic fracture withup to 30% height loss. There is associated abnormal soft tissue density within the ventral epidural space,   06/24/2015 Imaging   MRI lumbar: Focal osseous lesion with abnormal enhancement involving the right pedicle of L3, suspicious for possible osseous metastasisgiven the findings in the thoracic spine. Question additional focal lesion within the right iliac wing as above.     06/25/2015 Pathology Results   Accession: [id number] bone biopsy come from plasma cell neoplasm.   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 Zometa is not given due to inability to get dental clearance   12/14/2015 - 04/24/2016 Chemotherapy   He is started on maintenance treatment with Revlimid only   12/18/2015 Imaging   MRI thoracic and lumbar spine showed numerous enhancing foci throughout the thoracic and lumbar spine with several new small foci in the lumbar spine in comparison with prior MRI compatible with metastatic disease. Stable loss of height of the T3, T4, and T6 vertebral bodies and new postsurgical changes related to T6 laminectomy. No significant epidural disease or evidence for cord compression. No abnormal enhancement of the spinal  cord or cauda equina.   05/02/2016 Bone Marrow Biopsy   Outside bone marrow biopsy showed 20% myeloma involvement   05/16/2016 Procedure   Successful placement of a right  internal jugular approach power injectable Port-A-Cath. The catheter is ready for immediate use.   05/21/2016 - 07/03/2016 Chemotherapy   He received Kyprolis, Cytoxan and dexamethasone    07/08/2016 Procedure   Status post CT-guided bone marrow biopsy, with tissue specimen sent to pathology for complete histopathologic analysis   07/08/2016 Bone Marrow Biopsy   Bone Marrow, Aspirate,Biopsy, and Clot BONE MARROW: - MILDLY HYPERCELLULAR MARROW (60%) WITH PLASMA CELL NEOPLASM - SEE COMMENT PERIPHERAL BLOOD: - NORMOCYTIC ANEMIA Diagnosis Note The marrow is hypercellular with lambda-restricted plasma cells consistent with persistence of the patient's previously diagnosed plasma cell neoplasm. The plasma cells comprise approximately 10-15% of the total marrow cellularity, are enlarged, and arranged in clusters.   08/14/2016 Miscellaneous   He received conditioning treatment with melphalan   08/15/2016 Bone Marrow Transplant   He received autologous stem cell transplant   08/24/2016 - 08/29/2016 Hospital Admission   His post-transplant course was complicated by E-Coli bacteremia   11/26/2016 PET scan   PET CT at [facility] 1. Technically limited study due to soft tissue uptake. 2. New hypermetabolic uptake at C7 spinous process and left proximal femur that is of questionable significance in absence of underlying CT correlate. Further assessment with whole body bone scan may be considered. 2. Redemonstrated nonhypermetabolic multifocal lucent and sclerotic lesions throughout the spine which are similar to prior.    12/02/2016 Bone Marrow Biopsy   He had repeat bone marrow biopsy at [facility] An immunohistochemical stain for CD138 is performed on the bone marrow core biopsy demonstrates increased plasma cells with focal clustering (10-20% overall), which are monotypic for lambda light chain by in situ hybridization.   01/06/2017 - 06/09/2017 Chemotherapy   He received weekly Dexamethasone, Pomalyst days  1-21 and Daratumumab. From 06/08/17 onwards, he is placed on maintenance Pomalyst only.  He self discontinue Pomalyst in February 2021   07/23/2017 Procedure   Successful right IJ vein Port-A-Cath explant.     REVIEW OF SYSTEMS:   Constitutional: Denies fevers, chills or abnormal weight loss Eyes: Denies blurriness of vision Ears, nose, mouth, throat, and face: Denies mucositis or sore throat Respiratory: Denies cough, dyspnea or wheezes Cardiovascular: Denies palpitation, chest discomfort or lower extremity swelling Gastrointestinal:  Denies nausea, heartburn or change in bowel habits Skin: Denies abnormal skin rashes Lymphatics: Denies new lymphadenopathy or easy bruising Neurological:Denies numbness, tingling or new weaknesses Behavioral/Psych: Mood is stable, no new changes  All other systems were reviewed with the patient and are negative.  I have reviewed the past medical history, past surgical history, social history and family history with the patient and they are unchanged from previous note.  ALLERGIES:  has No Known Allergies.  MEDICATIONS:  Current Outpatient Medications  Medication Sig Dispense Refill  . calcium carbonate (TUMS - DOSED IN MG ELEMENTAL CALCIUM) 500 MG chewable tablet Chew 1 tablet by mouth 3 (three) times daily.    . cholecalciferol (VITAMIN D3) 25 MCG (1000 UT) tablet Take 1,000 Units by mouth daily.    Marland Kitchen ibuprofen (ADVIL,MOTRIN) 800 MG tablet Take 800 mg by mouth every 8 (eight) hours as needed (pain).     No current facility-administered medications for this visit.    PHYSICAL EXAMINATION: ECOG PERFORMANCE STATUS: 1 - Symptomatic but completely ambulatory  Vitals:   04/26/19 0924  BP: (!) 143/91  Pulse: 85  Resp: 18  Temp: 97.8 F (36.6 C)  SpO2: 100%   Filed Weights   04/26/19 0924  Weight: 236 lb 3.2 oz (107.1 kg)    GENERAL:alert, no distress and comfortable NEURO: alert & oriented x 3 with fluent speech, no focal motor/sensory  deficits  LABORATORY DATA:  I have reviewed the data as listed    Component Value Date/Time   NA 140 04/19/2019 1136   NA 139 01/27/2017 0809   K 3.8 04/19/2019 1136   K 4.1 01/27/2017 0809   CL 107 04/19/2019 1136   CO2 25 04/19/2019 1136   CO2 23 01/27/2017 0809   GLUCOSE 86 04/19/2019 1136   GLUCOSE 122 01/27/2017 0809   BUN 18 04/19/2019 1136   BUN 14.3 01/27/2017 0809   CREATININE 1.15 04/19/2019 1136   CREATININE 0.9 01/27/2017 0809   CALCIUM 9.4 04/19/2019 1136   CALCIUM 9.1 01/27/2017 0809   PROT 7.5 04/19/2019 1136   PROT 6.2 (L) 01/27/2017 0809   ALBUMIN 4.0 04/19/2019 1136   ALBUMIN 3.7 01/27/2017 0809   AST 19 04/19/2019 1136   AST 10 01/27/2017 0809   ALT 16 04/19/2019 1136   ALT 18 01/27/2017 0809   ALKPHOS 46 04/19/2019 1136   ALKPHOS 42 01/27/2017 0809   BILITOT 0.7 04/19/2019 1136   BILITOT 0.72 01/27/2017 0809   GFRNONAA >60 04/19/2019 1136   GFRAA >60 04/19/2019 1136    No results found for: SPEP, UPEP  Lab Results  Component Value Date   WBC 4.1 04/19/2019   NEUTROABS 1.9 04/19/2019   HGB 13.8 04/19/2019   HCT 40.5 04/19/2019   MCV 90.4 04/19/2019   PLT 172 04/19/2019      Chemistry      Component Value Date/Time   NA 140 04/19/2019 1136   NA 139 01/27/2017 0809   K 3.8 04/19/2019 1136   K 4.1 01/27/2017 0809   CL 107 04/19/2019 1136   CO2 25 04/19/2019 1136   CO2 23 01/27/2017 0809   BUN 18 04/19/2019 1136   BUN 14.3 01/27/2017 0809   CREATININE 1.15 04/19/2019 1136   CREATININE 0.9 01/27/2017 0809      Component Value Date/Time   CALCIUM 9.4 04/19/2019 1136   CALCIUM 9.1 01/27/2017 0809   ALKPHOS 46 04/19/2019 1136   ALKPHOS 42 01/27/2017 0809   AST 19 04/19/2019 1136   AST 10 01/27/2017 0809   ALT 16 04/19/2019 1136   ALT 18 01/27/2017 0809   BILITOT 0.7 04/19/2019 1136   BILITOT 0.72 01/27/2017 0809

## 2019-04-26 NOTE — Assessment & Plan Note (Signed)
He has achieved complete remission Despite being off Pomalyst, his recent myeloma panel was within normal limits We discussed the risk and benefits of discontinuation of Pomalyst As the patient desired to have children, I think is reasonable to take treatment off I recommend close follow-up every 3 months I recommend he continues to take calcium and vitamin D due to history of significant bone fracture

## 2019-04-27 ENCOUNTER — Telehealth: Payer: Self-pay | Admitting: Hematology and Oncology

## 2019-04-27 NOTE — Telephone Encounter (Signed)
Scheduled per 3/29 sch msg. Called and spoke with pt, confirmed 6/22 and 6/29 appts. Mailing printout

## 2019-07-20 ENCOUNTER — Other Ambulatory Visit: Payer: Self-pay

## 2019-07-20 ENCOUNTER — Ambulatory Visit: Payer: Medicare Other | Admitting: Hematology and Oncology

## 2019-07-20 ENCOUNTER — Inpatient Hospital Stay: Payer: Medicare Other | Attending: Hematology and Oncology

## 2019-07-20 DIAGNOSIS — E6609 Other obesity due to excess calories: Secondary | ICD-10-CM | POA: Diagnosis not present

## 2019-07-20 DIAGNOSIS — C9001 Multiple myeloma in remission: Secondary | ICD-10-CM | POA: Insufficient documentation

## 2019-07-20 DIAGNOSIS — R140 Abdominal distension (gaseous): Secondary | ICD-10-CM | POA: Diagnosis not present

## 2019-07-20 DIAGNOSIS — R7989 Other specified abnormal findings of blood chemistry: Secondary | ICD-10-CM | POA: Insufficient documentation

## 2019-07-20 DIAGNOSIS — Z9221 Personal history of antineoplastic chemotherapy: Secondary | ICD-10-CM | POA: Insufficient documentation

## 2019-07-20 DIAGNOSIS — Z9481 Bone marrow transplant status: Secondary | ICD-10-CM | POA: Diagnosis not present

## 2019-07-20 DIAGNOSIS — C9000 Multiple myeloma not having achieved remission: Secondary | ICD-10-CM

## 2019-07-20 LAB — CBC WITH DIFFERENTIAL/PLATELET
Abs Immature Granulocytes: 0.01 10*3/uL (ref 0.00–0.07)
Basophils Absolute: 0 10*3/uL (ref 0.0–0.1)
Basophils Relative: 1 %
Eosinophils Absolute: 0.2 10*3/uL (ref 0.0–0.5)
Eosinophils Relative: 4 %
HCT: 41.1 % (ref 39.0–52.0)
Hemoglobin: 14.1 g/dL (ref 13.0–17.0)
Immature Granulocytes: 0 %
Lymphocytes Relative: 40 %
Lymphs Abs: 2 10*3/uL (ref 0.7–4.0)
MCH: 30.9 pg (ref 26.0–34.0)
MCHC: 34.3 g/dL (ref 30.0–36.0)
MCV: 90.1 fL (ref 80.0–100.0)
Monocytes Absolute: 0.4 10*3/uL (ref 0.1–1.0)
Monocytes Relative: 8 %
Neutro Abs: 2.3 10*3/uL (ref 1.7–7.7)
Neutrophils Relative %: 47 %
Platelets: 155 10*3/uL (ref 150–400)
RBC: 4.56 MIL/uL (ref 4.22–5.81)
RDW: 12 % (ref 11.5–15.5)
WBC: 4.9 10*3/uL (ref 4.0–10.5)
nRBC: 0 % (ref 0.0–0.2)

## 2019-07-20 LAB — COMPREHENSIVE METABOLIC PANEL
ALT: 25 U/L (ref 0–44)
AST: 20 U/L (ref 15–41)
Albumin: 4 g/dL (ref 3.5–5.0)
Alkaline Phosphatase: 42 U/L (ref 38–126)
Anion gap: 9 (ref 5–15)
BUN: 26 mg/dL — ABNORMAL HIGH (ref 6–20)
CO2: 22 mmol/L (ref 22–32)
Calcium: 9.3 mg/dL (ref 8.9–10.3)
Chloride: 107 mmol/L (ref 98–111)
Creatinine, Ser: 1.31 mg/dL — ABNORMAL HIGH (ref 0.61–1.24)
GFR calc Af Amer: >60 mL/min (ref >60)
GFR calc non Af Amer: >60 mL/min (ref >60)
Glucose, Bld: 126 mg/dL — ABNORMAL HIGH (ref 70–99)
Potassium: 4 mmol/L (ref 3.5–5.1)
Sodium: 138 mmol/L (ref 135–145)
Total Bilirubin: 0.6 mg/dL (ref 0.3–1.2)
Total Protein: 7.3 g/dL (ref 6.5–8.1)

## 2019-07-23 LAB — MULTIPLE MYELOMA PANEL, SERUM
Albumin SerPl Elph-Mcnc: 3.8 g/dL (ref 2.9–4.4)
Albumin/Glob SerPl: 1.3 (ref 0.7–1.7)
Alpha 1: 0.1 g/dL (ref 0.0–0.4)
Alpha2 Glob SerPl Elph-Mcnc: 0.6 g/dL (ref 0.4–1.0)
B-Globulin SerPl Elph-Mcnc: 1 g/dL (ref 0.7–1.3)
Gamma Glob SerPl Elph-Mcnc: 1.4 g/dL (ref 0.4–1.8)
Globulin, Total: 3.1 g/dL (ref 2.2–3.9)
IgA: 69 mg/dL — ABNORMAL LOW (ref 90–386)
IgG (Immunoglobin G), Serum: 1436 mg/dL (ref 603–1613)
IgM (Immunoglobulin M), Srm: 29 mg/dL (ref 20–172)
Total Protein ELP: 6.9 g/dL (ref 6.0–8.5)

## 2019-07-23 LAB — KAPPA/LAMBDA LIGHT CHAINS
Kappa free light chain: 14.3 mg/L (ref 3.3–19.4)
Kappa, lambda light chain ratio: 1.29 (ref 0.26–1.65)
Lambda free light chains: 11.1 mg/L (ref 5.7–26.3)

## 2019-07-27 ENCOUNTER — Other Ambulatory Visit: Payer: Self-pay

## 2019-07-27 ENCOUNTER — Inpatient Hospital Stay (HOSPITAL_BASED_OUTPATIENT_CLINIC_OR_DEPARTMENT_OTHER): Payer: Medicare Other | Admitting: Hematology and Oncology

## 2019-07-27 ENCOUNTER — Other Ambulatory Visit: Payer: Medicare Other

## 2019-07-27 ENCOUNTER — Encounter: Payer: Self-pay | Admitting: Hematology and Oncology

## 2019-07-27 DIAGNOSIS — Z6830 Body mass index (BMI) 30.0-30.9, adult: Secondary | ICD-10-CM

## 2019-07-27 DIAGNOSIS — R140 Abdominal distension (gaseous): Secondary | ICD-10-CM

## 2019-07-27 DIAGNOSIS — R7989 Other specified abnormal findings of blood chemistry: Secondary | ICD-10-CM | POA: Diagnosis not present

## 2019-07-27 DIAGNOSIS — C9001 Multiple myeloma in remission: Secondary | ICD-10-CM

## 2019-07-27 DIAGNOSIS — E6609 Other obesity due to excess calories: Secondary | ICD-10-CM | POA: Diagnosis not present

## 2019-07-27 NOTE — Progress Notes (Signed)
[place] OFFICE PROGRESS NOTE  Patient Care Team: [name], MD as PCP - General (Internal Medicine)  ASSESSMENT & PLAN:  Multiple myeloma in remission [facility]) He has achieved complete remission Despite being off Pomalyst, his recent myeloma panel was within normal limits I recommend close follow-up every 3 months I recommend he continues to take calcium and vitamin D due to history of significant bone fracture   Abdominal bloating ClosedHe has intermittent abdominal bloating He has regular bowel habits He has not lost any weight Follow-up with primary care doctor and consider GI referral for screening colonoscopy  Class 1 obesity due to excess calories in adult We discussed the importance of cutting out sweet tea, dietary modification and exercise The patient appears motivated to lose weight on his own  Elevated serum creatinine He has mild intermittent elevated serum creatinine I recommend increase fluid hydration   No orders of the defined types were placed in this encounter.   All questions were answered. The patient knows to call the clinic with any problems, questions or concerns. The total time spent in the appointment was 20 minutes encounter with patients including review of chart and various tests results, discussions about plan of care and coordination of care plan   [name], MD 07/27/2019 10:25 AM  INTERVAL HISTORY: Please see below for problem oriented charting. He returns for myeloma follow-up He is feeling well after discontinuation of Pomalyst No recent infection, fever or chills No recent bone pain He complained of intermittent abdominal bloating He has regular bowel habits He has gained some weight since last time I saw him  SUMMARY OF ONCOLOGIC HISTORY: Oncology History  Multiple myeloma in remission ([place])  06/23/2015 - 06/28/2015 Hospital Admission   The patient was admitted to the hospital due to gait ataxia and back  pain. He was subsequently found to have cord compression underwent surgery and was discharged home   06/24/2015 Imaging   Abnormal appearance of the T6 vertebral body, highly suspicious for possible osseous metastasis. Associated pathologic fracture withup to 30% height loss. There is associated abnormal soft tissue density within the ventral epidural space,   06/24/2015 Imaging   MRI lumbar: Focal osseous lesion with abnormal enhancement involving the right pedicle of L3, suspicious for possible osseous metastasisgiven the findings in the thoracic spine. Question additional focal lesion within the right iliac wing as above.     06/25/2015 Pathology Results   Accession: [id number] bone biopsy come from plasma cell neoplasm.   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 Zometa is not given due to inability to get dental clearance   12/14/2015 - 04/24/2016 Chemotherapy   He is started on maintenance treatment with Revlimid only   12/18/2015 Imaging   MRI thoracic and lumbar spine showed numerous enhancing foci throughout the thoracic and lumbar spine with several new small foci in the lumbar spine in comparison with prior MRI compatible with metastatic disease. Stable loss of height of the T3, T4, and T6 vertebral bodies and new postsurgical changes related to T6 laminectomy. No significant epidural disease or evidence for cord compression. No abnormal enhancement of the spinal cord or cauda equina.   05/02/2016 Bone Marrow Biopsy   Outside bone marrow  biopsy showed 20% myeloma involvement   05/16/2016 Procedure   Successful placement of a right internal jugular approach power injectable Port-A-Cath. The catheter  is ready for immediate use.   05/21/2016 - 07/03/2016 Chemotherapy   He received Kyprolis, Cytoxan and dexamethasone    07/08/2016 Procedure   Status post CT-guided bone marrow biopsy, with tissue specimen sent to pathology for complete histopathologic analysis   07/08/2016 Bone Marrow Biopsy   Bone Marrow, Aspirate,Biopsy, and Clot BONE MARROW: - MILDLY HYPERCELLULAR MARROW (60%) WITH PLASMA CELL NEOPLASM - SEE COMMENT PERIPHERAL BLOOD: - NORMOCYTIC ANEMIA Diagnosis Note The marrow is hypercellular with lambda-restricted plasma cells consistent with persistence of the patient's previously diagnosed plasma cell neoplasm. The plasma cells comprise approximately 10-15% of the total marrow cellularity, are enlarged, and arranged in clusters.   08/14/2016 Miscellaneous   He received conditioning treatment with melphalan   08/15/2016 Bone Marrow Transplant   He received autologous stem cell transplant   08/24/2016 - 08/29/2016 Hospital Admission   His post-transplant course was complicated by E-Coli bacteremia   11/26/2016 PET scan   PET CT at [facility] 1. Technically limited study due to soft tissue uptake. 2. New hypermetabolic uptake at C7 spinous process and left proximal femur that is of questionable significance in absence of underlying CT correlate. Further assessment with whole body bone scan may be considered. 2. Redemonstrated nonhypermetabolic multifocal lucent and sclerotic lesions throughout the spine which are similar to prior.    12/02/2016 Bone Marrow Biopsy   He had repeat bone marrow biopsy at [facility] An immunohistochemical stain for CD138 is performed on the bone marrow core biopsy demonstrates increased plasma cells with focal clustering (10-20% overall), which are monotypic for lambda light chain by in situ hybridization.   01/06/2017 - 06/09/2017 Chemotherapy   He received weekly Dexamethasone, Pomalyst days 1-21 and Daratumumab. From 06/08/17 onwards, he is placed on maintenance  Pomalyst only.  He self discontinue Pomalyst in February 2021   07/23/2017 Procedure   Successful right IJ vein Port-A-Cath explant.     REVIEW OF SYSTEMS:   Constitutional: Denies fevers, chills or abnormal weight loss Eyes: Denies blurriness of vision Ears, nose, mouth, throat, and face: Denies mucositis or sore throat Respiratory: Denies cough, dyspnea or wheezes Cardiovascular: Denies palpitation, chest discomfort or lower extremity swelling Gastrointestinal:  Denies nausea, heartburn or change in bowel habits Skin: Denies abnormal skin rashes Lymphatics: Denies new lymphadenopathy or easy bruising Neurological:Denies numbness, tingling or new weaknesses Behavioral/Psych: Mood is stable, no new changes  All other systems were reviewed with the patient and are negative.  I have reviewed the past medical history, past surgical history, social history and family history with the patient and they are unchanged from previous note.  ALLERGIES:  has No Known Allergies.  MEDICATIONS:  Current Outpatient Medications  Medication Sig Dispense Refill  . calcium carbonate (TUMS - DOSED IN MG ELEMENTAL CALCIUM) 500 MG chewable tablet Chew 1 tablet by mouth 3 (three) times daily.    . cholecalciferol (VITAMIN D3) 25 MCG (1000 UT) tablet Take 1,000 Units by mouth daily.    Marland Kitchen ibuprofen (ADVIL,MOTRIN) 800 MG tablet Take 800 mg by mouth every 8 (eight) hours as needed (pain).     No current facility-administered medications for this visit.    PHYSICAL EXAMINATION: ECOG PERFORMANCE STATUS: 0 - Asymptomatic  Vitals:   07/27/19 1023  BP: 130/86  Pulse: 73  Resp: 18  Temp: 98.2 F (36.8 C)  SpO2: 100%   Filed Weights  07/27/19 1023  Weight: 238 lb 3.2 oz (108 kg)    GENERAL:alert, no distress and comfortable NEURO: alert & oriented x 3 with fluent speech, no focal motor/sensory deficits  LABORATORY DATA:  I have reviewed the data as listed    Component Value Date/Time   NA 138  07/20/2019 1012   NA 139 01/27/2017 0809   K 4.0 07/20/2019 1012   K 4.1 01/27/2017 0809   CL 107 07/20/2019 1012   CO2 22 07/20/2019 1012   CO2 23 01/27/2017 0809   GLUCOSE 126 (H) 07/20/2019 1012   GLUCOSE 122 01/27/2017 0809   BUN 26 (H) 07/20/2019 1012   BUN 14.3 01/27/2017 0809   CREATININE 1.31 (H) 07/20/2019 1012   CREATININE 0.9 01/27/2017 0809   CALCIUM 9.3 07/20/2019 1012   CALCIUM 9.1 01/27/2017 0809   PROT 7.3 07/20/2019 1012   PROT 6.2 (L) 01/27/2017 0809   ALBUMIN 4.0 07/20/2019 1012   ALBUMIN 3.7 01/27/2017 0809   AST 20 07/20/2019 1012   AST 10 01/27/2017 0809   ALT 25 07/20/2019 1012   ALT 18 01/27/2017 0809   ALKPHOS 42 07/20/2019 1012   ALKPHOS 42 01/27/2017 0809   BILITOT 0.6 07/20/2019 1012   BILITOT 0.72 01/27/2017 0809   GFRNONAA >60 07/20/2019 1012   GFRAA >60 07/20/2019 1012    No results found for: SPEP, UPEP  Lab Results  Component Value Date   WBC 4.9 07/20/2019   NEUTROABS 2.3 07/20/2019   HGB 14.1 07/20/2019   HCT 41.1 07/20/2019   MCV 90.1 07/20/2019   PLT 155 07/20/2019      Chemistry      Component Value Date/Time   NA 138 07/20/2019 1012   NA 139 01/27/2017 0809   K 4.0 07/20/2019 1012   K 4.1 01/27/2017 0809   CL 107 07/20/2019 1012   CO2 22 07/20/2019 1012   CO2 23 01/27/2017 0809   BUN 26 (H) 07/20/2019 1012   BUN 14.3 01/27/2017 0809   CREATININE 1.31 (H) 07/20/2019 1012   CREATININE 0.9 01/27/2017 0809      Component Value Date/Time   CALCIUM 9.3 07/20/2019 1012   CALCIUM 9.1 01/27/2017 0809   ALKPHOS 42 07/20/2019 1012   ALKPHOS 42 01/27/2017 0809   AST 20 07/20/2019 1012   AST 10 01/27/2017 0809   ALT 25 07/20/2019 1012   ALT 18 01/27/2017 0809   BILITOT 0.6 07/20/2019 1012   BILITOT 0.72 01/27/2017 0809

## 2019-07-27 NOTE — Assessment & Plan Note (Signed)
We discussed the importance of cutting out sweet tea, dietary modification and exercise The patient appears motivated to lose weight on his own

## 2019-07-27 NOTE — Assessment & Plan Note (Signed)
He has mild intermittent elevated serum creatinine I recommend increase fluid hydration

## 2019-07-27 NOTE — Assessment & Plan Note (Signed)
He has achieved complete remission Despite being off Pomalyst, his recent myeloma panel was within normal limits I recommend close follow-up every 3 months I recommend he continues to take calcium and vitamin D due to history of significant bone fracture

## 2019-07-27 NOTE — Assessment & Plan Note (Signed)
ClosedHe has intermittent abdominal bloating He has regular bowel habits He has not lost any weight Follow-up with primary care doctor and consider GI referral for screening colonoscopy

## 2019-10-18 ENCOUNTER — Other Ambulatory Visit: Payer: Self-pay

## 2019-10-18 ENCOUNTER — Inpatient Hospital Stay: Payer: Medicare (Managed Care) | Attending: Hematology and Oncology

## 2019-10-18 DIAGNOSIS — N469 Male infertility, unspecified: Secondary | ICD-10-CM | POA: Insufficient documentation

## 2019-10-18 DIAGNOSIS — C9001 Multiple myeloma in remission: Secondary | ICD-10-CM | POA: Insufficient documentation

## 2019-10-18 DIAGNOSIS — E6609 Other obesity due to excess calories: Secondary | ICD-10-CM | POA: Insufficient documentation

## 2019-10-18 DIAGNOSIS — C9000 Multiple myeloma not having achieved remission: Secondary | ICD-10-CM

## 2019-10-18 DIAGNOSIS — Z9481 Bone marrow transplant status: Secondary | ICD-10-CM | POA: Diagnosis not present

## 2019-10-18 DIAGNOSIS — Z9221 Personal history of antineoplastic chemotherapy: Secondary | ICD-10-CM | POA: Diagnosis not present

## 2019-10-18 LAB — CBC WITH DIFFERENTIAL/PLATELET
Abs Immature Granulocytes: 0.01 10*3/uL (ref 0.00–0.07)
Basophils Absolute: 0.1 10*3/uL (ref 0.0–0.1)
Basophils Relative: 1 %
Eosinophils Absolute: 0.2 10*3/uL (ref 0.0–0.5)
Eosinophils Relative: 4 %
HCT: 39.7 % (ref 39.0–52.0)
Hemoglobin: 13.5 g/dL (ref 13.0–17.0)
Immature Granulocytes: 0 %
Lymphocytes Relative: 38 %
Lymphs Abs: 2.1 10*3/uL (ref 0.7–4.0)
MCH: 30.7 pg (ref 26.0–34.0)
MCHC: 34 g/dL (ref 30.0–36.0)
MCV: 90.2 fL (ref 80.0–100.0)
Monocytes Absolute: 0.4 10*3/uL (ref 0.1–1.0)
Monocytes Relative: 8 %
Neutro Abs: 2.7 10*3/uL (ref 1.7–7.7)
Neutrophils Relative %: 49 %
Platelets: 163 10*3/uL (ref 150–400)
RBC: 4.4 MIL/uL (ref 4.22–5.81)
RDW: 12.3 % (ref 11.5–15.5)
WBC: 5.5 10*3/uL (ref 4.0–10.5)
nRBC: 0 % (ref 0.0–0.2)

## 2019-10-18 LAB — COMPREHENSIVE METABOLIC PANEL
ALT: 31 U/L (ref 0–44)
AST: 21 U/L (ref 15–41)
Albumin: 4.1 g/dL (ref 3.5–5.0)
Alkaline Phosphatase: 46 U/L (ref 38–126)
Anion gap: 6 (ref 5–15)
BUN: 23 mg/dL — ABNORMAL HIGH (ref 6–20)
CO2: 27 mmol/L (ref 22–32)
Calcium: 10 mg/dL (ref 8.9–10.3)
Chloride: 103 mmol/L (ref 98–111)
Creatinine, Ser: 1.21 mg/dL (ref 0.61–1.24)
GFR calc Af Amer: >60 mL/min (ref >60)
GFR calc non Af Amer: >60 mL/min (ref >60)
Glucose, Bld: 99 mg/dL (ref 70–99)
Potassium: 4 mmol/L (ref 3.5–5.1)
Sodium: 136 mmol/L (ref 135–145)
Total Bilirubin: 0.5 mg/dL (ref 0.3–1.2)
Total Protein: 7.6 g/dL (ref 6.5–8.1)

## 2019-10-19 LAB — KAPPA/LAMBDA LIGHT CHAINS
Kappa free light chain: 19.8 mg/L — ABNORMAL HIGH (ref 3.3–19.4)
Kappa, lambda light chain ratio: 1.5 (ref 0.26–1.65)
Lambda free light chains: 13.2 mg/L (ref 5.7–26.3)

## 2019-10-20 LAB — MULTIPLE MYELOMA PANEL, SERUM
Albumin SerPl Elph-Mcnc: 4 g/dL (ref 2.9–4.4)
Albumin/Glob SerPl: 1.3 (ref 0.7–1.7)
Alpha 1: 0.2 g/dL (ref 0.0–0.4)
Alpha2 Glob SerPl Elph-Mcnc: 0.6 g/dL (ref 0.4–1.0)
B-Globulin SerPl Elph-Mcnc: 0.9 g/dL (ref 0.7–1.3)
Gamma Glob SerPl Elph-Mcnc: 1.4 g/dL (ref 0.4–1.8)
Globulin, Total: 3.1 g/dL (ref 2.2–3.9)
IgA: 90 mg/dL (ref 90–386)
IgG (Immunoglobin G), Serum: 1451 mg/dL (ref 603–1613)
IgM (Immunoglobulin M), Srm: 40 mg/dL (ref 20–172)
Total Protein ELP: 7.1 g/dL (ref 6.0–8.5)

## 2019-10-25 ENCOUNTER — Telehealth: Payer: Self-pay | Admitting: Hematology and Oncology

## 2019-10-25 ENCOUNTER — Other Ambulatory Visit: Payer: Self-pay

## 2019-10-25 ENCOUNTER — Encounter: Payer: Self-pay | Admitting: Hematology and Oncology

## 2019-10-25 ENCOUNTER — Inpatient Hospital Stay (HOSPITAL_BASED_OUTPATIENT_CLINIC_OR_DEPARTMENT_OTHER): Payer: Medicare (Managed Care) | Admitting: Hematology and Oncology

## 2019-10-25 DIAGNOSIS — N469 Male infertility, unspecified: Secondary | ICD-10-CM | POA: Diagnosis not present

## 2019-10-25 DIAGNOSIS — Z299 Encounter for prophylactic measures, unspecified: Secondary | ICD-10-CM | POA: Insufficient documentation

## 2019-10-25 DIAGNOSIS — E6609 Other obesity due to excess calories: Secondary | ICD-10-CM | POA: Diagnosis not present

## 2019-10-25 DIAGNOSIS — Z6830 Body mass index (BMI) 30.0-30.9, adult: Secondary | ICD-10-CM

## 2019-10-25 DIAGNOSIS — C9001 Multiple myeloma in remission: Secondary | ICD-10-CM

## 2019-10-25 NOTE — Assessment & Plan Note (Signed)
He has achieved complete remission Despite being off Pomalyst in Feb 2021, his recent myeloma panel was within normal limits I recommend close follow-up every 3 months I recommend he continues to take calcium and vitamin D due to history of significant bone fracture

## 2019-10-25 NOTE — Assessment & Plan Note (Signed)
Despite stopping chemotherapy, he is not able to get his wife pregnant He is asking whether he could be rendered infertile from his prior bone marrow transplant It appears that the patient did not undergo sperm banking prior to bone marrow transplant I recommend him to see fertility clinic specialist if needed for further evaluation

## 2019-10-25 NOTE — Assessment & Plan Note (Addendum)
We discussed the importance of dietary modification and exercise The patient appears motivated to lose weight on his own

## 2019-10-25 NOTE — Progress Notes (Signed)
[facility] OFFICE PROGRESS NOTE  Patient Care Team: Bradley Reach, MD as PCP - General (Internal Medicine)  ASSESSMENT & PLAN:  Multiple myeloma in remission [facility]) He has achieved complete remission Despite being off Pomalyst in Feb 2021, his recent myeloma panel was within normal limits I recommend close follow-up every 3 months I recommend he continues to take calcium and vitamin D due to history of significant bone fracture   Infertility male Despite stopping chemotherapy, he is not able to get his wife pregnant He is asking whether he could be rendered infertile from his prior bone marrow transplant It appears that the patient did not undergo sperm banking prior to bone marrow transplant I recommend him to see fertility clinic specialist if needed for further evaluation  Preventive measure We reviewed the importance of getting influenza vaccination He declined influenza vaccination today  Class 1 obesity due to excess calories in adult We discussed the importance of dietary modification and exercise The patient appears motivated to lose weight on his own   No orders of the defined types were placed in this encounter.   All questions were answered. The patient knows to call the clinic with any problems, questions or concerns. The total time spent in the appointment was 20 minutes encounter with patients including review of chart and various tests results, discussions about plan of care and coordination of care plan   Bradley Lark, MD 10/25/2019 1:13 PM  INTERVAL HISTORY: Please see below for problem oriented charting. He returns for myeloma follow-up Since last time I saw him, he was not able to get his wife pregnant He is wondering whether the chemotherapy has rendered him infertile No recent infection, fever or chills He has some mild persistent back pain but overall improved  SUMMARY OF ONCOLOGIC HISTORY: Oncology History  Multiple myeloma in  remission ([place])  06/23/2015 - 06/28/2015 Hospital Admission   The patient was admitted to the hospital due to gait ataxia and back pain. He was subsequently found to have cord compression underwent surgery and was discharged home   06/24/2015 Imaging   Abnormal appearance of the T6 vertebral body, highly suspicious for possible osseous metastasis. Associated pathologic fracture withup to 30% height loss. There is associated abnormal soft tissue density within the ventral epidural space,   06/24/2015 Imaging   MRI lumbar: Focal osseous lesion with abnormal enhancement involving the right pedicle of L3, suspicious for possible osseous metastasisgiven the findings in the thoracic spine. Question additional focal lesion within the right iliac wing as above.     06/25/2015 Pathology Results   Accession: [id number] bone biopsy come from plasma cell neoplasm.   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 Zometa is not given due to inability to get dental clearance   12/14/2015 - 04/24/2016 Chemotherapy   He is started on maintenance treatment with Revlimid only   12/18/2015 Imaging   MRI thoracic and lumbar spine showed numerous enhancing foci throughout the thoracic and lumbar spine with several new small foci in the lumbar spine in comparison with prior MRI compatible with metastatic disease. Stable loss of height of the T3, T4, and T6 vertebral bodies and new postsurgical changes related to T6 laminectomy. No significant epidural  disease or evidence for cord compression. No abnormal enhancement of the spinal cord or cauda equina.   05/02/2016 Bone Marrow Biopsy   Outside bone marrow biopsy showed 20% myeloma  involvement   05/16/2016 Procedure   Successful placement of a right internal jugular approach power injectable Port-A-Cath. The catheter is ready for immediate use.   05/21/2016 - 07/03/2016 Chemotherapy   He received Kyprolis, Cytoxan and dexamethasone    07/08/2016 Procedure   Status post CT-guided bone marrow biopsy, with tissue specimen sent to pathology for complete histopathologic analysis   07/08/2016 Bone Marrow Biopsy   Bone Marrow, Aspirate,Biopsy, and Clot BONE MARROW: - MILDLY HYPERCELLULAR MARROW (60%) WITH PLASMA CELL NEOPLASM - SEE COMMENT PERIPHERAL BLOOD: - NORMOCYTIC ANEMIA Diagnosis Note The marrow is hypercellular with lambda-restricted plasma cells consistent with persistence of the patient's previously diagnosed plasma cell neoplasm. The plasma cells comprise approximately 10-15% of the total marrow cellularity, are enlarged, and arranged in clusters.   08/14/2016 Miscellaneous   He received conditioning treatment with melphalan   08/15/2016 Bone Marrow Transplant   He received autologous stem cell transplant   08/24/2016 - 08/29/2016 Hospital Admission   His post-transplant course was complicated by E-Coli bacteremia   11/26/2016 PET scan   PET CT at [facility] 1. Technically limited study due to soft tissue uptake. 2. New hypermetabolic uptake at C7 spinous process and left proximal femur that is of questionable significance in absence of underlying CT correlate. Further assessment with whole body bone scan may be considered. 2. Redemonstrated nonhypermetabolic multifocal lucent and sclerotic lesions throughout the spine which are similar to prior.    12/02/2016 Bone Marrow Biopsy   He had repeat bone marrow biopsy at [facility] An immunohistochemical stain for CD138 is performed on the bone marrow core biopsy demonstrates increased plasma cells with focal clustering (10-20% overall), which are monotypic for lambda light chain by in situ hybridization.   01/06/2017 -  06/09/2017 Chemotherapy   He received weekly Dexamethasone, Pomalyst days 1-21 and Daratumumab. From 06/08/17 onwards, he is placed on maintenance Pomalyst only.  He self discontinue Pomalyst in February 2021   07/23/2017 Procedure   Successful right IJ vein Port-A-Cath explant.     REVIEW OF SYSTEMS:   Constitutional: Denies fevers, chills or abnormal weight loss Eyes: Denies blurriness of vision Ears, nose, mouth, throat, and face: Denies mucositis or sore throat Respiratory: Denies cough, dyspnea or wheezes Cardiovascular: Denies palpitation, chest discomfort or lower extremity swelling Gastrointestinal:  Denies nausea, heartburn or change in bowel habits Skin: Denies abnormal skin rashes Lymphatics: Denies new lymphadenopathy or easy bruising Neurological:Denies numbness, tingling or new weaknesses Behavioral/Psych: Mood is stable, no new changes  All other systems were reviewed with the patient and are negative.  I have reviewed the past medical history, past surgical history, social history and family history with the patient and they are unchanged from previous note.  ALLERGIES:  has No Known Allergies.  MEDICATIONS:  Current Outpatient Medications  Medication Sig Dispense Refill   calcium carbonate (TUMS - DOSED IN MG ELEMENTAL CALCIUM) 500 MG chewable tablet Chew 1 tablet by mouth 3 (three) times daily.     cholecalciferol (VITAMIN D3) 25 MCG (1000 UT) tablet Take 1,000 Units by mouth daily.     ibuprofen (ADVIL,MOTRIN) 800 MG tablet Take 800 mg by mouth every 8 (eight) hours as needed (pain).     No current facility-administered medications for this visit.    PHYSICAL EXAMINATION: ECOG PERFORMANCE STATUS: 1 - Symptomatic but completely  ambulatory  Vitals:   10/25/19 1024  BP: 139/86  Pulse: 78  Resp: 18  Temp: (!) 97.5 F (36.4 C)  SpO2: 98%   Filed Weights   10/25/19 1024  Weight: 239 lb 3.2 oz (108.5 kg)    GENERAL:alert, no distress and  comfortable SKIN: skin color, texture, turgor are normal, no rashes or significant lesions EYES: normal, Conjunctiva are pink and non-injected, sclera clear OROPHARYNX:no exudate, no erythema and lips, buccal mucosa, and tongue normal  NECK: supple, thyroid normal size, non-tender, without nodularity LYMPH:  no palpable lymphadenopathy in the cervical, axillary or inguinal LUNGS: clear to auscultation and percussion with normal breathing effort HEART: regular rate & rhythm and no murmurs and no lower extremity edema ABDOMEN:abdomen soft, non-tender and normal bowel sounds Musculoskeletal:no cyanosis of digits and no clubbing  NEURO: alert & oriented x 3 with fluent speech, no focal motor/sensory deficits  LABORATORY DATA:  I have reviewed the data as listed    Component Value Date/Time   NA 136 10/18/2019 1017   NA 139 01/27/2017 0809   K 4.0 10/18/2019 1017   K 4.1 01/27/2017 0809   CL 103 10/18/2019 1017   CO2 27 10/18/2019 1017   CO2 23 01/27/2017 0809   GLUCOSE 99 10/18/2019 1017   GLUCOSE 122 01/27/2017 0809   BUN 23 (H) 10/18/2019 1017   BUN 14.3 01/27/2017 0809   CREATININE 1.21 10/18/2019 1017   CREATININE 0.9 01/27/2017 0809   CALCIUM 10.0 10/18/2019 1017   CALCIUM 9.1 01/27/2017 0809   PROT 7.6 10/18/2019 1017   PROT 6.2 (L) 01/27/2017 0809   ALBUMIN 4.1 10/18/2019 1017   ALBUMIN 3.7 01/27/2017 0809   AST 21 10/18/2019 1017   AST 10 01/27/2017 0809   ALT 31 10/18/2019 1017   ALT 18 01/27/2017 0809   ALKPHOS 46 10/18/2019 1017   ALKPHOS 42 01/27/2017 0809   BILITOT 0.5 10/18/2019 1017   BILITOT 0.72 01/27/2017 0809   GFRNONAA >60 10/18/2019 1017   GFRAA >60 10/18/2019 1017    No results found for: SPEP, UPEP  Lab Results  Component Value Date   WBC 5.5 10/18/2019   NEUTROABS 2.7 10/18/2019   HGB 13.5 10/18/2019   HCT 39.7 10/18/2019   MCV 90.2 10/18/2019   PLT 163 10/18/2019      Chemistry      Component Value Date/Time   NA 136 10/18/2019 1017    NA 139 01/27/2017 0809   K 4.0 10/18/2019 1017   K 4.1 01/27/2017 0809   CL 103 10/18/2019 1017   CO2 27 10/18/2019 1017   CO2 23 01/27/2017 0809   BUN 23 (H) 10/18/2019 1017   BUN 14.3 01/27/2017 0809   CREATININE 1.21 10/18/2019 1017   CREATININE 0.9 01/27/2017 0809      Component Value Date/Time   CALCIUM 10.0 10/18/2019 1017   CALCIUM 9.1 01/27/2017 0809   ALKPHOS 46 10/18/2019 1017   ALKPHOS 42 01/27/2017 0809   AST 21 10/18/2019 1017   AST 10 01/27/2017 0809   ALT 31 10/18/2019 1017   ALT 18 01/27/2017 0809   BILITOT 0.5 10/18/2019 1017   BILITOT 0.72 01/27/2017 0809

## 2019-10-25 NOTE — Assessment & Plan Note (Signed)
We reviewed the importance of getting influenza vaccination He declined influenza vaccination today

## 2019-10-25 NOTE — Telephone Encounter (Signed)
Scheduled appts per 9/27 sch msg. Gave pt a print out of AVS.

## 2020-01-17 ENCOUNTER — Inpatient Hospital Stay: Payer: Medicare (Managed Care) | Attending: Hematology and Oncology

## 2020-01-17 ENCOUNTER — Other Ambulatory Visit: Payer: Self-pay

## 2020-01-17 DIAGNOSIS — R509 Fever, unspecified: Secondary | ICD-10-CM | POA: Insufficient documentation

## 2020-01-17 DIAGNOSIS — C9001 Multiple myeloma in remission: Secondary | ICD-10-CM | POA: Insufficient documentation

## 2020-01-17 DIAGNOSIS — C9000 Multiple myeloma not having achieved remission: Secondary | ICD-10-CM

## 2020-01-17 DIAGNOSIS — G8929 Other chronic pain: Secondary | ICD-10-CM | POA: Diagnosis not present

## 2020-01-17 DIAGNOSIS — M549 Dorsalgia, unspecified: Secondary | ICD-10-CM | POA: Insufficient documentation

## 2020-01-17 LAB — COMPREHENSIVE METABOLIC PANEL
ALT: 36 U/L (ref 0–44)
AST: 24 U/L (ref 15–41)
Albumin: 3.8 g/dL (ref 3.5–5.0)
Alkaline Phosphatase: 41 U/L (ref 38–126)
Anion gap: 4 — ABNORMAL LOW (ref 5–15)
BUN: 15 mg/dL (ref 6–20)
CO2: 30 mmol/L (ref 22–32)
Calcium: 9.4 mg/dL (ref 8.9–10.3)
Chloride: 108 mmol/L (ref 98–111)
Creatinine, Ser: 1.16 mg/dL (ref 0.61–1.24)
GFR, Estimated: >60 mL/min (ref >60)
Glucose, Bld: 112 mg/dL — ABNORMAL HIGH (ref 70–99)
Potassium: 3.8 mmol/L (ref 3.5–5.1)
Sodium: 142 mmol/L (ref 135–145)
Total Bilirubin: 0.7 mg/dL (ref 0.3–1.2)
Total Protein: 7 g/dL (ref 6.5–8.1)

## 2020-01-17 LAB — CBC WITH DIFFERENTIAL/PLATELET
Abs Immature Granulocytes: 0 10*3/uL (ref 0.00–0.07)
Basophils Absolute: 0 10*3/uL (ref 0.0–0.1)
Basophils Relative: 1 %
Eosinophils Absolute: 0.2 10*3/uL (ref 0.0–0.5)
Eosinophils Relative: 3 %
HCT: 39.1 % (ref 39.0–52.0)
Hemoglobin: 13.7 g/dL (ref 13.0–17.0)
Immature Granulocytes: 0 %
Lymphocytes Relative: 43 %
Lymphs Abs: 1.9 10*3/uL (ref 0.7–4.0)
MCH: 30.5 pg (ref 26.0–34.0)
MCHC: 35 g/dL (ref 30.0–36.0)
MCV: 87.1 fL (ref 80.0–100.0)
Monocytes Absolute: 0.4 10*3/uL (ref 0.1–1.0)
Monocytes Relative: 8 %
Neutro Abs: 2 10*3/uL (ref 1.7–7.7)
Neutrophils Relative %: 45 %
Platelets: 162 10*3/uL (ref 150–400)
RBC: 4.49 MIL/uL (ref 4.22–5.81)
RDW: 11.9 % (ref 11.5–15.5)
WBC: 4.4 10*3/uL (ref 4.0–10.5)
nRBC: 0 % (ref 0.0–0.2)

## 2020-01-18 LAB — KAPPA/LAMBDA LIGHT CHAINS
Kappa free light chain: 16.8 mg/L (ref 3.3–19.4)
Kappa, lambda light chain ratio: 1.17 (ref 0.26–1.65)
Lambda free light chains: 14.4 mg/L (ref 5.7–26.3)

## 2020-01-18 LAB — MULTIPLE MYELOMA PANEL, SERUM
Albumin SerPl Elph-Mcnc: 3.8 g/dL (ref 2.9–4.4)
Albumin/Glob SerPl: 1.4 (ref 0.7–1.7)
Alpha 1: 0.1 g/dL (ref 0.0–0.4)
Alpha2 Glob SerPl Elph-Mcnc: 0.6 g/dL (ref 0.4–1.0)
B-Globulin SerPl Elph-Mcnc: 0.9 g/dL (ref 0.7–1.3)
Gamma Glob SerPl Elph-Mcnc: 1.3 g/dL (ref 0.4–1.8)
Globulin, Total: 2.8 g/dL (ref 2.2–3.9)
IgA: 106 mg/dL (ref 90–386)
IgG (Immunoglobin G), Serum: 1331 mg/dL (ref 603–1613)
IgM (Immunoglobulin M), Srm: 29 mg/dL (ref 20–172)
Total Protein ELP: 6.6 g/dL (ref 6.0–8.5)

## 2020-01-24 ENCOUNTER — Encounter: Payer: Self-pay | Admitting: Hematology and Oncology

## 2020-01-24 ENCOUNTER — Inpatient Hospital Stay (HOSPITAL_BASED_OUTPATIENT_CLINIC_OR_DEPARTMENT_OTHER): Payer: Medicare (Managed Care) | Admitting: Hematology and Oncology

## 2020-01-24 ENCOUNTER — Encounter (HOSPITAL_COMMUNITY): Payer: Self-pay

## 2020-01-24 ENCOUNTER — Emergency Department (HOSPITAL_COMMUNITY): Payer: Medicare (Managed Care)

## 2020-01-24 ENCOUNTER — Emergency Department (HOSPITAL_COMMUNITY)
Admission: EM | Admit: 2020-01-24 | Discharge: 2020-01-24 | Disposition: A | Discharge status: Home / Self Care | Payer: Medicare (Managed Care) | Attending: Emergency Medicine | Admitting: Emergency Medicine

## 2020-01-24 ENCOUNTER — Telehealth: Payer: Self-pay | Admitting: Hematology and Oncology

## 2020-01-24 ENCOUNTER — Other Ambulatory Visit: Payer: Self-pay

## 2020-01-24 DIAGNOSIS — R509 Fever, unspecified: Secondary | ICD-10-CM | POA: Insufficient documentation

## 2020-01-24 DIAGNOSIS — J181 Lobar pneumonia, unspecified organism: Secondary | ICD-10-CM | POA: Insufficient documentation

## 2020-01-24 DIAGNOSIS — R059 Cough, unspecified: Secondary | ICD-10-CM | POA: Diagnosis present

## 2020-01-24 DIAGNOSIS — Z21 Asymptomatic human immunodeficiency virus [HIV] infection status: Secondary | ICD-10-CM | POA: Insufficient documentation

## 2020-01-24 DIAGNOSIS — J189 Pneumonia, unspecified organism: Secondary | ICD-10-CM

## 2020-01-24 DIAGNOSIS — Z20822 Contact with and (suspected) exposure to covid-19: Secondary | ICD-10-CM | POA: Diagnosis not present

## 2020-01-24 DIAGNOSIS — Z87891 Personal history of nicotine dependence: Secondary | ICD-10-CM | POA: Diagnosis not present

## 2020-01-24 DIAGNOSIS — M5459 Other low back pain: Secondary | ICD-10-CM | POA: Diagnosis not present

## 2020-01-24 DIAGNOSIS — C9001 Multiple myeloma in remission: Secondary | ICD-10-CM | POA: Diagnosis not present

## 2020-01-24 DIAGNOSIS — G8929 Other chronic pain: Secondary | ICD-10-CM | POA: Diagnosis not present

## 2020-01-24 DIAGNOSIS — M549 Dorsalgia, unspecified: Secondary | ICD-10-CM | POA: Diagnosis not present

## 2020-01-24 DIAGNOSIS — Z8579 Personal history of other malignant neoplasms of lymphoid, hematopoietic and related tissues: Secondary | ICD-10-CM | POA: Insufficient documentation

## 2020-01-24 LAB — CBC WITH DIFFERENTIAL/PLATELET
Abs Immature Granulocytes: 0.01 10*3/uL (ref 0.00–0.07)
Basophils Absolute: 0 10*3/uL (ref 0.0–0.1)
Basophils Relative: 1 %
Eosinophils Absolute: 0 10*3/uL (ref 0.0–0.5)
Eosinophils Relative: 1 %
HCT: 41.3 % (ref 39.0–52.0)
Hemoglobin: 14.2 g/dL (ref 13.0–17.0)
Immature Granulocytes: 0 %
Lymphocytes Relative: 32 %
Lymphs Abs: 1.8 10*3/uL (ref 0.7–4.0)
MCH: 30.8 pg (ref 26.0–34.0)
MCHC: 34.4 g/dL (ref 30.0–36.0)
MCV: 89.6 fL (ref 80.0–100.0)
Monocytes Absolute: 0.6 10*3/uL (ref 0.1–1.0)
Monocytes Relative: 10 %
Neutro Abs: 3.3 10*3/uL (ref 1.7–7.7)
Neutrophils Relative %: 56 %
Platelets: 134 10*3/uL — ABNORMAL LOW (ref 150–400)
RBC: 4.61 MIL/uL (ref 4.22–5.81)
RDW: 11.9 % (ref 11.5–15.5)
WBC: 5.8 10*3/uL (ref 4.0–10.5)
nRBC: 0 % (ref 0.0–0.2)

## 2020-01-24 LAB — RESP PANEL BY RT-PCR (FLU A&B, COVID) ARPGX2
Influenza A by PCR: NEGATIVE
Influenza B by PCR: NEGATIVE
SARS Coronavirus 2 by RT PCR: NEGATIVE

## 2020-01-24 LAB — COMPREHENSIVE METABOLIC PANEL
ALT: 38 U/L (ref 0–44)
AST: 33 U/L (ref 15–41)
Albumin: 4.1 g/dL (ref 3.5–5.0)
Alkaline Phosphatase: 41 U/L (ref 38–126)
Anion gap: 7 (ref 5–15)
BUN: 13 mg/dL (ref 6–20)
CO2: 28 mmol/L (ref 22–32)
Calcium: 9.1 mg/dL (ref 8.9–10.3)
Chloride: 103 mmol/L (ref 98–111)
Creatinine, Ser: 1.16 mg/dL (ref 0.61–1.24)
GFR, Estimated: >60 mL/min (ref >60)
Glucose, Bld: 97 mg/dL (ref 70–99)
Potassium: 3.9 mmol/L (ref 3.5–5.1)
Sodium: 138 mmol/L (ref 135–145)
Total Bilirubin: 0.9 mg/dL (ref 0.3–1.2)
Total Protein: 7.5 g/dL (ref 6.5–8.1)

## 2020-01-24 MED ORDER — BENZONATATE 100 MG PO CAPS
100.0000 mg | ORAL_CAPSULE | Freq: Once | ORAL | Status: AC
  Administered 2020-01-24: 17:00:00 100 mg via ORAL
  Filled 2020-01-24: qty 1

## 2020-01-24 MED ORDER — BENZONATATE 100 MG PO CAPS
100.0000 mg | ORAL_CAPSULE | Freq: Three times a day (TID) | ORAL | 0 refills | Status: DC
Start: 2020-01-24 — End: 2020-05-02

## 2020-01-24 MED ORDER — ACETAMINOPHEN 500 MG PO TABS
1000.0000 mg | ORAL_TABLET | Freq: Once | ORAL | Status: AC
  Administered 2020-01-24: 17:00:00 1000 mg via ORAL
  Filled 2020-01-24: qty 2

## 2020-01-24 MED ORDER — AMOXICILLIN-POT CLAVULANATE 875-125 MG PO TABS: 1.0000 | ORAL_TABLET | Freq: Two times a day (BID) | ORAL | 0 refills | Status: DC

## 2020-01-24 MED ORDER — KETOROLAC TROMETHAMINE 15 MG/ML IJ SOLN
15.0000 mg | Freq: Once | INTRAMUSCULAR | Status: AC
  Administered 2020-01-24: 17:00:00 15 mg via INTRAVENOUS
  Filled 2020-01-24: qty 1

## 2020-01-24 MED ORDER — AMOXICILLIN-POT CLAVULANATE 875-125 MG PO TABS
1.0000 | ORAL_TABLET | Freq: Once | ORAL | Status: AC
  Administered 2020-01-24: 19:00:00 1 via ORAL
  Filled 2020-01-24: qty 1

## 2020-01-24 MED ORDER — SODIUM CHLORIDE 0.9 % IV BOLUS
1000.0000 mL | Freq: Once | INTRAVENOUS | Status: AC
  Administered 2020-01-24: 17:00:00 1000 mL via INTRAVENOUS

## 2020-01-24 NOTE — Progress Notes (Signed)
[place] OFFICE PROGRESS NOTE  Patient Care Team: [name], MD as PCP - General (Internal Medicine)  ASSESSMENT & PLAN:  Multiple myeloma in remission [facility]) I have reviewed blood work with the patient briefly He is still in remission Despite being off Pomalyst in Feb 2021, his recent myeloma panel was within normal limits I recommend close follow-up every 3 months I recommend he continues to take calcium and vitamin D due to history of significant bone fracture   Chronic back pain greater than 3 months duration He has acute on chronic back pain He rated his pain as severe today He admits he lifted something heavy recently Unfortunately, outpatient work-up might take up to a week He wants to be evaluated in the emergency department for this and he went on his own accord to the emergency department for further evaluation   Fever He was noted to have high grade fever of 101.6 He denies cough, sore throat or chills He has not received influenza vaccination He is vaccinated for COVID-19 but has not received booster shot He will get further evaluation in the emergency department for this   No orders of the defined types were placed in this encounter.   All questions were answered. The patient knows to call the clinic with any problems, questions or concerns. The total time spent in the appointment was 20 minutes encounter with patients including review of chart and various tests results, discussions about plan of care and coordination of care plan   [name], MD 01/24/2020 10:40 AM  INTERVAL HISTORY: Please see below for problem oriented charting. The patient was late for his appointment He complained of severe 10 out of 10 pain He was also noted to have high-grade fever 101.6 when he checks in, repeat oral temperature was 100.3 He denies cough, sore throat or shortness of breath He told me he is on his way to get evaluated for his back pain and  after he leaves the office, he will go to the emergency department  SUMMARY OF ONCOLOGIC HISTORY: Oncology History  Multiple myeloma in remission ([place])  06/23/2015 - 06/28/2015 Hospital Admission   The patient was admitted to the hospital due to gait ataxia and back pain. He was subsequently found to have cord compression underwent surgery and was discharged home   06/24/2015 Imaging   Abnormal appearance of the T6 vertebral body, highly suspicious for possible osseous metastasis. Associated pathologic fracture withup to 30% height loss. There is associated abnormal soft tissue density within the ventral epidural space,   06/24/2015 Imaging   MRI lumbar: Focal osseous lesion with abnormal enhancement involving the right pedicle of L3, suspicious for possible osseous metastasisgiven the findings in the thoracic spine. Question additional focal lesion within the right iliac wing as above.     06/25/2015 Pathology Results   Accession: [id number] bone biopsy come from plasma cell neoplasm.   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 Zometa is not given due to inability to get dental clearance   12/14/2015 - 04/24/2016 Chemotherapy   He is started on maintenance treatment with Revlimid only   12/18/2015 Imaging   MRI thoracic and lumbar spine showed numerous enhancing foci throughout the thoracic and  lumbar spine with several new small foci in the lumbar spine in comparison with prior MRI compatible with metastatic disease. Stable loss of height of the T3, T4, and T6 vertebral bodies and new postsurgical changes related to T6 laminectomy. No significant epidural disease or evidence for cord compression. No abnormal  enhancement of the spinal cord or cauda equina.   05/02/2016 Bone Marrow Biopsy   Outside bone marrow biopsy showed 20% myeloma involvement   05/16/2016 Procedure   Successful placement of a right internal jugular approach power injectable Port-A-Cath. The catheter is ready for immediate use.   05/21/2016 - 07/03/2016 Chemotherapy   He received Kyprolis, Cytoxan and dexamethasone    07/08/2016 Procedure   Status post CT-guided bone marrow biopsy, with tissue specimen sent to pathology for complete histopathologic analysis   07/08/2016 Bone Marrow Biopsy   Bone Marrow, Aspirate,Biopsy, and Clot BONE MARROW: - MILDLY HYPERCELLULAR MARROW (60%) WITH PLASMA CELL NEOPLASM - SEE COMMENT PERIPHERAL BLOOD: - NORMOCYTIC ANEMIA Diagnosis Note The marrow is hypercellular with lambda-restricted plasma cells consistent with persistence of the patient's previously diagnosed plasma cell neoplasm. The plasma cells comprise approximately 10-15% of the total marrow cellularity, are enlarged, and arranged in clusters.   08/14/2016 Miscellaneous   He received conditioning treatment with melphalan   08/15/2016 Bone Marrow Transplant   He received autologous stem cell transplant   08/24/2016 - 08/29/2016 Hospital Admission   His post-transplant course was complicated by E-Coli bacteremia   11/26/2016 PET scan   [facility] at [facility] 1. Technically limited study due to soft tissue uptake. 2. New hypermetabolic uptake at C7 spinous process and left proximal femur that is of questionable significance in absence of underlying CT correlate. Further assessment with whole body bone scan may be considered. 2. Redemonstrated nonhypermetabolic multifocal lucent and sclerotic lesions throughout the spine which are similar to prior.    12/02/2016 Bone Marrow Biopsy   He had repeat bone marrow biopsy at [facility] An immunohistochemical stain for CD138 is performed on the bone marrow core biopsy demonstrates increased plasma cells  with focal clustering (10-20% overall), which are monotypic for lambda light chain by in situ hybridization.   01/06/2017 - 06/09/2017 Chemotherapy   He received weekly Dexamethasone, Pomalyst days 1-21 and Daratumumab. From 06/08/17 onwards, he is placed on maintenance Pomalyst only.  He self discontinue Pomalyst in February 2021   07/23/2017 Procedure   Successful right IJ vein Port-A-Cath explant.     REVIEW OF SYSTEMS:   Eyes: Denies blurriness of vision Ears, nose, mouth, throat, and face: Denies mucositis or sore throat Respiratory: Denies cough, dyspnea or wheezes Cardiovascular: Denies palpitation, chest discomfort or lower extremity swelling Gastrointestinal:  Denies nausea, heartburn or change in bowel habits Skin: Denies abnormal skin rashes Lymphatics: Denies new lymphadenopathy or easy bruising Neurological:Denies numbness, tingling or new weaknesses Behavioral/Psych: Mood is stable, no new changes  All other systems were reviewed with the patient and are negative.  I have reviewed the past medical history, past surgical history, social history and family history with the patient and they are unchanged from previous note.  ALLERGIES:  has No Known Allergies.  MEDICATIONS:  Current Outpatient Medications  Medication Sig Dispense Refill  . calcium carbonate (TUMS - DOSED IN MG ELEMENTAL CALCIUM) 500 MG chewable tablet Chew 1 tablet by mouth 3 (three) times daily.    . cholecalciferol (VITAMIN D3) 25 MCG (1000 UT) tablet Take 1,000 Units by mouth daily.    Marland Kitchen ibuprofen (ADVIL,MOTRIN) 800 MG tablet Take  800 mg by mouth every 8 (eight) hours as needed (pain).     No current facility-administered medications for this visit.    PHYSICAL EXAMINATION: ECOG PERFORMANCE STATUS: 1 - Symptomatic but completely ambulatory  Vitals:   01/24/20 1031  BP: (!) 152/116  Pulse: (!) 119  Resp: 18  Temp: 100.3 F (37.9 C)  SpO2: 96%   Filed Weights   01/24/20 1031  Weight: 241 lb  (109.3 kg)    GENERAL:alert, no distress and comfortable NEURO: alert & oriented x 3 with fluent speech  LABORATORY DATA:  I have reviewed the data as listed    Component Value Date/Time   NA 142 01/17/2020 1042   NA 139 01/27/2017 0809   K 3.8 01/17/2020 1042   K 4.1 01/27/2017 0809   CL 108 01/17/2020 1042   CO2 30 01/17/2020 1042   CO2 23 01/27/2017 0809   GLUCOSE 112 (H) 01/17/2020 1042   GLUCOSE 122 01/27/2017 0809   BUN 15 01/17/2020 1042   BUN 14.3 01/27/2017 0809   CREATININE 1.16 01/17/2020 1042   CREATININE 0.9 01/27/2017 0809   CALCIUM 9.4 01/17/2020 1042   CALCIUM 9.1 01/27/2017 0809   PROT 7.0 01/17/2020 1042   PROT 6.2 (L) 01/27/2017 0809   ALBUMIN 3.8 01/17/2020 1042   ALBUMIN 3.7 01/27/2017 0809   AST 24 01/17/2020 1042   AST 10 01/27/2017 0809   ALT 36 01/17/2020 1042   ALT 18 01/27/2017 0809   ALKPHOS 41 01/17/2020 1042   ALKPHOS 42 01/27/2017 0809   BILITOT 0.7 01/17/2020 1042   BILITOT 0.72 01/27/2017 0809   GFRNONAA >60 01/17/2020 1042   GFRAA >60 10/18/2019 1017    No results found for: SPEP, UPEP  Lab Results  Component Value Date   WBC 4.4 01/17/2020   NEUTROABS 2.0 01/17/2020   HGB 13.7 01/17/2020   HCT 39.1 01/17/2020   MCV 87.1 01/17/2020   PLT 162 01/17/2020      Chemistry      Component Value Date/Time   NA 142 01/17/2020 1042   NA 139 01/27/2017 0809   K 3.8 01/17/2020 1042   K 4.1 01/27/2017 0809   CL 108 01/17/2020 1042   CO2 30 01/17/2020 1042   CO2 23 01/27/2017 0809   BUN 15 01/17/2020 1042   BUN 14.3 01/27/2017 0809   CREATININE 1.16 01/17/2020 1042   CREATININE 0.9 01/27/2017 0809      Component Value Date/Time   CALCIUM 9.4 01/17/2020 1042   CALCIUM 9.1 01/27/2017 0809   ALKPHOS 41 01/17/2020 1042   ALKPHOS 42 01/27/2017 0809   AST 24 01/17/2020 1042   AST 10 01/27/2017 0809   ALT 36 01/17/2020 1042   ALT 18 01/27/2017 0809   BILITOT 0.7 01/17/2020 1042   BILITOT 0.72 01/27/2017 0809

## 2020-01-24 NOTE — Discharge Instructions (Signed)
Take tylenol 2 pills 4 times a day and motrin 4 pills 3 times a day.  Drink plenty of fluids.  Return for worsening shortness of breath, headache, confusion. Follow up with your oncologist.   Return for weakness, numbness of your arms or legs, difficulty talking, swallowing.

## 2020-01-24 NOTE — ED Triage Notes (Signed)
Pt presents with c/o back pain and headache for 2 weeks. Pt reports that he has been taking Tylenol and Ibuprofen for the pain with no relief. Pt reports he went to the cancer center today for a checkup and was told he had a fever. Pt is afebrile in triage and reports he does not feel like he has a fever.

## 2020-01-24 NOTE — Assessment & Plan Note (Signed)
I have reviewed blood work with the patient briefly He is still in remission Despite being off Pomalyst in Feb 2021, his recent myeloma panel was within normal limits I recommend close follow-up every 3 months I recommend he continues to take calcium and vitamin D due to history of significant bone fracture

## 2020-01-24 NOTE — Assessment & Plan Note (Signed)
He was noted to have high grade fever of 101.6 He denies cough, sore throat or chills He has not received influenza vaccination He is vaccinated for COVID-19 but has not received booster shot He will get further evaluation in the emergency department for this

## 2020-01-24 NOTE — Telephone Encounter (Signed)
Scheduled follow-up appointments per 12/27 schedule message. Patient is aware.

## 2020-01-24 NOTE — ED Provider Notes (Signed)
[place] DEPT Provider Note   CSN: [id number] Arrival date & time: 01/24/20  1036     History Chief Complaint  Patient presents with  . Back Pain  . Headache    Bradley Hunt is a 49 y.o. male.  49 yo M with a cc of headache, back pain, cough, congestion, rhinorrhea fatigue.  No noted fevers, but went to see his oncologist today and had a temperature of 101. Was sent down to the ED for evaluation. He denies sick contacts. Denies nausea vomiting or diarrhea. Denies abdominal pain. Has been having headaches and back pain with his multiple myeloma. Typically improves with ibuprofen. Took some ibuprofen this morning with significant improvement. Denies numbness or weakness denies difficulty speech or swallowing has been able to ambulate without issue.  The history is provided by the patient.  Back Pain Location:  Generalized Quality:  Aching Radiates to: posterior occiput. Associated symptoms: fever and headaches   Associated symptoms: no abdominal pain and no chest pain   Headache Associated symptoms: back pain, congestion, cough and fever   Associated symptoms: no abdominal pain, no diarrhea, no myalgias and no vomiting   Illness Severity:  Moderate Onset quality:  Gradual Duration:  5 days Timing:  Constant Progression:  Worsening Chronicity:  New Associated symptoms: congestion, cough, fever and headaches   Associated symptoms: no abdominal pain, no chest pain, no diarrhea, no myalgias, no rash, no shortness of breath and no vomiting        Past Medical History:  Diagnosis Date  . Bone metastases ([place])    T spine and L spine  . History of chemotherapy   . History of radiation therapy   . Multiple myeloma not having achieved remission ([place]) 06/25/15  . Numbness    lower extermities bilat     Patient Active Problem List   Diagnosis Date Noted  . Fever 01/24/2020  . Infertility male 10/25/2019  . Preventive measure 10/25/2019  .  Abdominal bloating 07/27/2019  . Class 1 obesity due to excess calories in adult 07/27/2019  . Chronic back pain greater than 3 months duration 01/19/2018  . Encounter for screening for HIV 05/12/2017  . Pancytopenia, acquired ([place]) 07/17/2016  . Goals of care, counseling/discussion 05/10/2016  . 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 Constipation, chronic 11/03/2015  . Elevated serum creatinine 07/28/2015  . Multiple myeloma in remission ([place]) 07/18/2015  . 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 Leukocytosis 06/24/2015  . 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 Bony metastasis ([place]) 06/24/2015    Past Surgical History:  Procedure Laterality Date  . ANTERIOR CRUCIATE LIGAMENT REPAIR    . IR FLUORO GUIDE PORT INSERTION RIGHT  05/16/2016  . IR REMOVAL TUN ACCESS W/ PORT W/O FL MOD SED  07/23/2017  . IR US GUIDE VASC ACCESS RIGHT  05/16/2016  . LAMINECTOMY N/A 06/25/2015   Procedure: Thoracic six LAMINECTOMY RESECTION FOR TUMOR;  Surgeon: Consuella Lose, MD;  Location: [place] [facility]UR[facility] ORS;  Service: Neurosurgery;  Laterality: N/A;       Family History  Problem Relation Age of Onset  . Cancer Neg Hx     Social History   Tobacco Use  . Smoking status: Former Smoker    Packs/day: 1.00    Years: 10.00    Pack years: 10.00    Types: Cigarettes    Quit date: 01/28/1997    Years since quitting: 23.0  . Smokeless tobacco: Never Used  Vaping Use  .  Vaping Use: Never used  Substance Use Topics  . Alcohol use: No  . Drug use: No    Home Medications Prior to Admission medications   Medication Sig Start Date End Date Taking? Authorizing Provider  acetaminophen (TYLENOL) 500 MG tablet Take 500 mg by mouth every 6 (six) hours as needed for moderate pain.   Yes [provider]  amoxicillin-clavulanate (AUGMENTIN) 875-125 MG tablet Take 1 tablet by mouth every 12 (twelve) hours. 01/24/20  Yes Deno Etienne, DO  benzonatate (TESSALON) 100 MG capsule Take 1  capsule (100 mg total) by mouth every 8 (eight) hours. 01/24/20  Yes Deno Etienne, DO  calcium carbonate (TUMS - DOSED IN MG ELEMENTAL CALCIUM) 500 MG chewable tablet Chew 1 tablet by mouth 3 (three) times daily.   Yes [provider]  cholecalciferol (VITAMIN D3) 25 MCG (1000 UT) tablet Take 1,000 Units by mouth daily.   Yes [provider]  ibuprofen (ADVIL,MOTRIN) 800 MG tablet Take 800 mg by mouth every 8 (eight) hours as needed (pain).   Yes [provider]  Loratadine-Pseudoephedrine (CLARITIN-D 24 HOUR PO) Take 1 tablet by mouth every evening.   Yes [provider]    Allergies    Patient has no known allergies.  Review of Systems   Review of Systems  Constitutional: Positive for chills and fever.  HENT: Positive for congestion. Negative for facial swelling.   Eyes: Negative for discharge and visual disturbance.  Respiratory: Positive for cough. Negative for shortness of breath.   Cardiovascular: Negative for chest pain and palpitations.  Gastrointestinal: Negative for abdominal pain, diarrhea and vomiting.  Musculoskeletal: Positive for back pain. Negative for arthralgias and myalgias.  Skin: Negative for color change and rash.  Neurological: Positive for headaches. Negative for tremors and syncope.  Psychiatric/Behavioral: Negative for confusion and dysphoric mood.    Physical Exam Updated Vital Signs BP (!) 149/101   Pulse 92   Temp 98.9 F (37.2 C) (Oral)   Resp 18 Comment: Simultaneous filing. User may not have seen previous data.  SpO2 96%   Physical Exam Vitals and nursing note reviewed.  Constitutional:      Appearance: He is well-developed and well-nourished.  HENT:     Head: Normocephalic and atraumatic.  Eyes:     Extraocular Movements: EOM normal.     Pupils: Pupils are equal, round, and reactive to light.  Neck:     Vascular: No JVD.  Cardiovascular:     Rate and Rhythm: Normal rate and regular rhythm.     Heart  sounds: No murmur heard. No friction rub. No gallop.   Pulmonary:     Effort: No respiratory distress.     Breath sounds: Rhonchi present. No wheezing.     Comments: Right sided rhonchi Abdominal:     General: There is no distension.     Tenderness: There is no guarding or rebound.  Musculoskeletal:        General: Normal range of motion.     Cervical back: Normal range of motion and neck supple.  Skin:    Coloration: Skin is not pale.     Findings: No rash.  Neurological:     Mental Status: He is alert and oriented to person, place, and time.  Psychiatric:        Mood and Affect: Mood and affect normal.        Behavior: Behavior normal.     ED Results / Procedures / Treatments   Labs (all labs  ordered are listed, but only abnormal results are displayed) Labs Reviewed  CBC WITH DIFFERENTIAL/PLATELET - Abnormal; Notable for the following components:      Result Value   Platelets 134 (*)    All other components within normal limits  RESP PANEL BY RT-PCR (FLU A&B, COVID) [id number]  COMPREHENSIVE METABOLIC PANEL    EKG None  Radiology DG Chest Port 1 View  Result Date: 01/24/2020 CLINICAL DATA:  Cough and chills EXAM: PORTABLE CHEST 1 VIEW COMPARISON:  April 04, 2018 FINDINGS: There is subtle ill-defined opacity in each lung base. Lungs otherwise are clear. Heart size and pulmonary vascularity are normal. No adenopathy. No bone lesions. IMPRESSION: Ill-defined opacity in each lung base consistent with bibasilar pneumonia. Advise check of COVID-19 status given this appearance. Lungs otherwise clear. Heart size normal. Electronically Signed   By: Lowella Grip III M.D.   On: 01/24/2020 16:16    Procedures Procedures (including critical care time)  Medications Ordered in ED Medications  amoxicillin-clavulanate (AUGMENTIN) 875-125 MG per tablet 1 tablet (has no administration in time range)  sodium chloride 0.9 % bolus 1,000 mL (0 mLs Intravenous Stopped 01/24/20 1820)   ketorolac (TORADOL) 15 MG/ML injection 15 mg (15 mg Intravenous Given 01/24/20 1711)  acetaminophen (TYLENOL) tablet 1,000 mg (1,000 mg Oral Given 01/24/20 1711)  benzonatate (TESSALON) capsule 100 mg (100 mg Oral Given 01/24/20 1711)    ED Course  I have reviewed the triage vital signs and the nursing notes.  Pertinent labs & imaging results that were available during my care of the patient were reviewed by me and considered in my medical decision making (see chart for details).    MDM Rules/Calculators/A&P                          49 yo M with a chief complaints of headache and back pain.  He also has symptoms consistent with a viral syndrome.  Has focal rhonchi on my exam.  Symptoms could be typical with the coronavirus. He is currently getting chemotherapy for multiple myeloma.  I think this is less likely to be meningitis or encephalitis without any significant neck rigidity, he is well-appearing and nontoxic.  Headache improved significantly with IV fluids and Toradol.  Also feel this is less likely to be a primary spinal infection with his cough and congestion and focal lung findings.  CXR viewed by me with new focal infiltrate to the right lower lobe.  Consistent with where I heard rhonchi.  Awaiting covid testing.   Patient is not neutropenic.  Covid test is negative.  Suspect that the patient has community-acquired pneumonia.  I discussed risk and benefits of inpatient versus outpatient therapy.  Patient is currently electing to try and treat this as an outpatient.  We will have him call his oncologist tomorrow.  6:39 PM:  I have discussed the diagnosis/risks/treatment options with the patient and believe the pt to be eligible for discharge home to follow-up with PCP, Oncology. We also discussed returning to the ED immediately if new or worsening sx occur. We discussed the sx which are most concerning (e.g., sudden worsening pain, fever, inability to tolerate by mouth, stroke s/sx,  cauda equina s/sx) that necessitate immediate return. Medications administered to the patient during their visit and any new prescriptions provided to the patient are listed below.  Medications given during this visit Medications  amoxicillin-clavulanate (AUGMENTIN) 875-125 MG per tablet 1 tablet (has no administration in time range)  sodium chloride 0.9 % bolus 1,000 mL (0 mLs Intravenous Stopped 01/24/20 1820)  ketorolac (TORADOL) 15 MG/ML injection 15 mg (15 mg Intravenous Given 01/24/20 1711)  acetaminophen (TYLENOL) tablet 1,000 mg (1,000 mg Oral Given 01/24/20 1711)  benzonatate (TESSALON) capsule 100 mg (100 mg Oral Given 01/24/20 1711)     The patient appears reasonably screen and/or stabilized for discharge and I doubt any other medical condition or other [facility] requiring further screening, evaluation, or treatment in the ED at this time prior to discharge.     Final Clinical Impression(s) / ED Diagnoses Final diagnoses:  Community acquired pneumonia of right lower lobe of lung    Rx / DC Orders ED Discharge Orders         Ordered    amoxicillin-clavulanate (AUGMENTIN) 875-125 MG tablet  Every 12 hours        01/24/20 1835    benzonatate (TESSALON) 100 MG capsule  Every 8 hours        01/24/20 Rice, Cyan Clippinger, DO 01/24/20 1839

## 2020-01-24 NOTE — Assessment & Plan Note (Signed)
He has acute on chronic back pain He rated his pain as severe today He admits he lifted something heavy recently Unfortunately, outpatient work-up might take up to a week He wants to be evaluated in the emergency department for this and he went on his own accord to the emergency department for further evaluation

## 2020-04-17 ENCOUNTER — Inpatient Hospital Stay: Payer: Medicare (Managed Care) | Attending: Internal Medicine

## 2020-04-17 DIAGNOSIS — C9001 Multiple myeloma in remission: Secondary | ICD-10-CM | POA: Insufficient documentation

## 2020-04-21 ENCOUNTER — Telehealth: Payer: Self-pay

## 2020-04-21 NOTE — Telephone Encounter (Signed)
-----   Message from [name], MD sent at 04/21/2020 12:39 PM EDT ----- He no show for labs this week Please try to call him I will not see him on Monday, needs to be rescheduled Labs to be done 1 week prior to seeing me

## 2020-04-21 NOTE — Telephone Encounter (Signed)
Called and left below message asking him to call the office back to reschedule.

## 2020-04-24 ENCOUNTER — Inpatient Hospital Stay: Payer: Medicare (Managed Care) | Admitting: Hematology and Oncology

## 2020-04-24 ENCOUNTER — Other Ambulatory Visit: Payer: Self-pay

## 2020-04-24 ENCOUNTER — Inpatient Hospital Stay: Payer: Medicare (Managed Care)

## 2020-04-24 DIAGNOSIS — C9000 Multiple myeloma not having achieved remission: Secondary | ICD-10-CM

## 2020-04-24 DIAGNOSIS — C9001 Multiple myeloma in remission: Secondary | ICD-10-CM | POA: Diagnosis present

## 2020-04-24 LAB — CBC WITH DIFFERENTIAL/PLATELET
Abs Immature Granulocytes: 0.01 10*3/uL (ref 0.00–0.07)
Basophils Absolute: 0 10*3/uL (ref 0.0–0.1)
Basophils Relative: 1 %
Eosinophils Absolute: 0.2 10*3/uL (ref 0.0–0.5)
Eosinophils Relative: 4 %
HCT: 39.4 % (ref 39.0–52.0)
Hemoglobin: 13.7 g/dL (ref 13.0–17.0)
Immature Granulocytes: 0 %
Lymphocytes Relative: 50 %
Lymphs Abs: 2.2 10*3/uL (ref 0.7–4.0)
MCH: 30.6 pg (ref 26.0–34.0)
MCHC: 34.8 g/dL (ref 30.0–36.0)
MCV: 88.1 fL (ref 80.0–100.0)
Monocytes Absolute: 0.4 10*3/uL (ref 0.1–1.0)
Monocytes Relative: 8 %
Neutro Abs: 1.7 10*3/uL (ref 1.7–7.7)
Neutrophils Relative %: 37 %
Platelets: 165 10*3/uL (ref 150–400)
RBC: 4.47 MIL/uL (ref 4.22–5.81)
RDW: 12 % (ref 11.5–15.5)
WBC: 4.5 10*3/uL (ref 4.0–10.5)
nRBC: 0 % (ref 0.0–0.2)

## 2020-04-24 LAB — COMPREHENSIVE METABOLIC PANEL
ALT: 22 U/L (ref 0–44)
AST: 18 U/L (ref 15–41)
Albumin: 4.2 g/dL (ref 3.5–5.0)
Alkaline Phosphatase: 40 U/L (ref 38–126)
Anion gap: 9 (ref 5–15)
BUN: 17 mg/dL (ref 6–20)
CO2: 26 mmol/L (ref 22–32)
Calcium: 9.4 mg/dL (ref 8.9–10.3)
Chloride: 104 mmol/L (ref 98–111)
Creatinine, Ser: 1.21 mg/dL (ref 0.61–1.24)
GFR, Estimated: >60 mL/min (ref >60)
Glucose, Bld: 106 mg/dL — ABNORMAL HIGH (ref 70–99)
Potassium: 3.9 mmol/L (ref 3.5–5.1)
Sodium: 139 mmol/L (ref 135–145)
Total Bilirubin: 0.6 mg/dL (ref 0.3–1.2)
Total Protein: 7.2 g/dL (ref 6.5–8.1)

## 2020-04-24 NOTE — Telephone Encounter (Signed)
Called and rescheduled appts. He is aware of appts date/times.

## 2020-04-25 LAB — KAPPA/LAMBDA LIGHT CHAINS
Kappa free light chain: 14.2 mg/L (ref 3.3–19.4)
Kappa, lambda light chain ratio: 1.12 (ref 0.26–1.65)
Lambda free light chains: 12.7 mg/L (ref 5.7–26.3)

## 2020-04-26 LAB — MULTIPLE MYELOMA PANEL, SERUM
Albumin SerPl Elph-Mcnc: 3.9 g/dL (ref 2.9–4.4)
Albumin/Glob SerPl: 1.4 (ref 0.7–1.7)
Alpha 1: 0.2 g/dL (ref 0.0–0.4)
Alpha2 Glob SerPl Elph-Mcnc: 0.6 g/dL (ref 0.4–1.0)
B-Globulin SerPl Elph-Mcnc: 0.9 g/dL (ref 0.7–1.3)
Gamma Glob SerPl Elph-Mcnc: 1.2 g/dL (ref 0.4–1.8)
Globulin, Total: 2.9 g/dL (ref 2.2–3.9)
IgA: 113 mg/dL (ref 90–386)
IgG (Immunoglobin G), Serum: 1239 mg/dL (ref 603–1613)
IgM (Immunoglobulin M), Srm: 33 mg/dL (ref 20–172)
Total Protein ELP: 6.8 g/dL (ref 6.0–8.5)

## 2020-05-02 ENCOUNTER — Other Ambulatory Visit: Payer: Self-pay

## 2020-05-02 ENCOUNTER — Inpatient Hospital Stay: Payer: Medicare (Managed Care) | Attending: Hematology and Oncology | Admitting: Hematology and Oncology

## 2020-05-02 ENCOUNTER — Encounter: Payer: Self-pay | Admitting: Hematology and Oncology

## 2020-05-02 DIAGNOSIS — R030 Elevated blood-pressure reading, without diagnosis of hypertension: Secondary | ICD-10-CM | POA: Insufficient documentation

## 2020-05-02 DIAGNOSIS — N468 Other male infertility: Secondary | ICD-10-CM | POA: Diagnosis not present

## 2020-05-02 DIAGNOSIS — N469 Male infertility, unspecified: Secondary | ICD-10-CM | POA: Diagnosis not present

## 2020-05-02 DIAGNOSIS — C9001 Multiple myeloma in remission: Secondary | ICD-10-CM | POA: Insufficient documentation

## 2020-05-02 DIAGNOSIS — I10 Essential (primary) hypertension: Secondary | ICD-10-CM | POA: Insufficient documentation

## 2020-05-02 NOTE — Assessment & Plan Note (Signed)
The patient is found to be infertile after bone marrow transplant I do not recommend the patient to spend more money on further work-up I recommend the patient that he should consider adopting children

## 2020-05-02 NOTE — Progress Notes (Signed)
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   All questions were answered. The patient knows to call the clinic with any problems, questions or concerns. The total time spent in the appointment was 20 minutes encounter with patients including review of chart and various tests results, discussions about plan of care and coordination of care plan   Bradley Lark, MD 05/02/2020 10:05 AM  INTERVAL HISTORY: Please see below for problem oriented charting. He returns for further follow-up He is still doing work-up for infertility His chronic back pain is stable with intermittent flare He has no recent infection  SUMMARY OF ONCOLOGIC HISTORY: Oncology History  Multiple myeloma in remission ([place])  06/23/2015 - 06/28/2015 Hospital Admission   The patient was admitted to the hospital due to gait ataxia and back pain. He was subsequently found to have cord compression underwent surgery and was discharged home   06/24/2015 Imaging   Abnormal appearance of the T6  vertebral body, highly suspicious for possible osseous metastasis. Associated pathologic fracture withup to 30% height loss. There is associated abnormal soft tissue density within the ventral epidural space,   06/24/2015 Imaging   MRI lumbar: Focal osseous lesion with abnormal enhancement involving the right pedicle of L3, suspicious for possible osseous metastasisgiven the findings in the thoracic spine. Question additional focal lesion within the right iliac wing as above.     06/25/2015 Pathology Results   Accession: [id number] bone biopsy come from plasma cell neoplasm.   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 Zometa is not given due to inability to get dental clearance   12/14/2015 - 04/24/2016 Chemotherapy   He is started on maintenance treatment with Revlimid only   12/18/2015 Imaging   MRI thoracic and lumbar spine showed numerous enhancing foci throughout the thoracic and lumbar spine with several new small foci in the lumbar spine in comparison with prior MRI compatible with metastatic disease. Stable loss of height of the T3, T4, and T6 vertebral bodies and new postsurgical changes related to T6 laminectomy. No significant epidural disease or evidence for cord compression. No abnormal enhancement of the spinal cord or cauda equina.   05/02/2016 Bone Marrow Biopsy   Outside bone marrow biopsy showed 20% myeloma involvement   05/16/2016 Procedure   Successful placement of a  right internal jugular approach power injectable Port-A-Cath. The catheter is ready for immediate use.   05/21/2016 - 07/03/2016 Chemotherapy   He received Kyprolis, Cytoxan and dexamethasone    07/08/2016 Procedure   Status  post CT-guided bone marrow biopsy, with tissue specimen sent to pathology for complete histopathologic analysis   07/08/2016 Bone Marrow Biopsy   Bone Marrow, Aspirate,Biopsy, and Clot BONE MARROW: - MILDLY HYPERCELLULAR MARROW (60%) WITH PLASMA CELL NEOPLASM - SEE COMMENT PERIPHERAL BLOOD: - NORMOCYTIC ANEMIA Diagnosis Note The marrow is hypercellular with lambda-restricted plasma cells consistent with persistence of the patient's previously diagnosed plasma cell neoplasm. The plasma cells comprise approximately 10-15% of the total marrow cellularity, are enlarged, and arranged in clusters.   08/14/2016 Miscellaneous   He received conditioning treatment with melphalan   08/15/2016 Bone Marrow Transplant   He received autologous stem cell transplant   08/24/2016 - 08/29/2016 Hospital Admission   His post-transplant course was complicated by E-Coli bacteremia   11/26/2016 PET scan   PET CT at [facility] 1. Technically limited study due to soft tissue uptake. 2. New hypermetabolic uptake at C7 spinous process and left proximal femur that is of questionable significance in absence of underlying CT correlate. Further assessment with whole body bone scan may be considered. 2. Redemonstrated nonhypermetabolic multifocal lucent and sclerotic lesions throughout the spine which are similar to prior.    12/02/2016 Bone Marrow Biopsy   He had repeat bone marrow biopsy at [facility] An immunohistochemical stain for CD138 is performed on the bone marrow core biopsy demonstrates increased plasma cells with focal clustering (10-20% overall), which are monotypic for lambda light chain by in situ hybridization.   01/06/2017 - 06/09/2017 Chemotherapy   He received weekly Dexamethasone, Pomalyst days 1-21 and Daratumumab. From 06/08/17 onwards, he is placed on maintenance Pomalyst only.  He self discontinue Pomalyst in February 2021   07/23/2017 Procedure   Successful right IJ vein Port-A-Cath explant.     REVIEW OF  SYSTEMS:   Constitutional: Denies fevers, chills or abnormal weight loss Eyes: Denies blurriness of vision Ears, nose, mouth, throat, and face: Denies mucositis or sore throat Respiratory: Denies cough, dyspnea or wheezes Cardiovascular: Denies palpitation, chest discomfort or lower extremity swelling Gastrointestinal:  Denies nausea, heartburn or change in bowel habits Skin: Denies abnormal skin rashes Lymphatics: Denies new lymphadenopathy or easy bruising Neurological:Denies numbness, tingling or new weaknesses Behavioral/Psych: Mood is stable, no new changes  All other systems were reviewed with the patient and are negative.  I have reviewed the past medical history, past surgical history, social history and family history with the patient and they are unchanged from previous note.  ALLERGIES:  has No Known Allergies.  MEDICATIONS:  Current Outpatient Medications  Medication Sig Dispense Refill  . acetaminophen (TYLENOL) 500 MG tablet Take 500 mg by mouth every 6 (six) hours as needed for moderate pain.    . calcium carbonate (TUMS - DOSED IN MG ELEMENTAL CALCIUM) 500 MG chewable tablet Chew 1 tablet by mouth 3 (three) times daily.    . cholecalciferol (VITAMIN D3) 25 MCG (1000 UT) tablet Take 1,000 Units by mouth daily.    Marland Kitchen ibuprofen (ADVIL,MOTRIN) 800 MG tablet Take 800 mg by mouth every 8 (eight) hours as needed (pain).    . Loratadine-Pseudoephedrine (CLARITIN-D 24 HOUR PO) Take 1 tablet by mouth every evening.     No current facility-administered medications for this visit.    PHYSICAL EXAMINATION: ECOG PERFORMANCE STATUS: 1 - Symptomatic but completely ambulatory  Vitals:  05/02/20 0854 05/02/20 0857  BP: (!) 146/95 (!) 139/100  Pulse: 77   Resp: 18   Temp: (!) 96.8 F (36 C)   SpO2: 100%    Filed Weights   05/02/20 0854  Weight: 238 lb 3.2 oz (108 kg)    GENERAL:alert, no distress and comfortable  NEURO: alert & oriented x 3 with fluent speech, no focal  motor/sensory deficits  LABORATORY DATA:  I have reviewed the data as listed    Component Value Date/Time   NA 139 04/24/2020 1509   NA 139 01/27/2017 0809   K 3.9 04/24/2020 1509   K 4.1 01/27/2017 0809   CL 104 04/24/2020 1509   CO2 26 04/24/2020 1509   CO2 23 01/27/2017 0809   GLUCOSE 106 (H) 04/24/2020 1509   GLUCOSE 122 01/27/2017 0809   BUN 17 04/24/2020 1509   BUN 14.3 01/27/2017 0809   CREATININE 1.21 04/24/2020 1509   CREATININE 0.9 01/27/2017 0809   CALCIUM 9.4 04/24/2020 1509   CALCIUM 9.1 01/27/2017 0809   PROT 7.2 04/24/2020 1509   PROT 6.2 (L) 01/27/2017 0809   ALBUMIN 4.2 04/24/2020 1509   ALBUMIN 3.7 01/27/2017 0809   AST 18 04/24/2020 1509   AST 10 01/27/2017 0809   ALT 22 04/24/2020 1509   ALT 18 01/27/2017 0809   ALKPHOS 40 04/24/2020 1509   ALKPHOS 42 01/27/2017 0809   BILITOT 0.6 04/24/2020 1509   BILITOT 0.72 01/27/2017 0809   GFRNONAA >60 04/24/2020 1509   GFRAA >60 10/18/2019 1017    No results found for: SPEP, UPEP  Lab Results  Component Value Date   WBC 4.5 04/24/2020   NEUTROABS 1.7 04/24/2020   HGB 13.7 04/24/2020   HCT 39.4 04/24/2020   MCV 88.1 04/24/2020   PLT 165 04/24/2020      Chemistry      Component Value Date/Time   NA 139 04/24/2020 1509   NA 139 01/27/2017 0809   K 3.9 04/24/2020 1509   K 4.1 01/27/2017 0809   CL 104 04/24/2020 1509   CO2 26 04/24/2020 1509   CO2 23 01/27/2017 0809   BUN 17 04/24/2020 1509   BUN 14.3 01/27/2017 0809   CREATININE 1.21 04/24/2020 1509   CREATININE 0.9 01/27/2017 0809      Component Value Date/Time   CALCIUM 9.4 04/24/2020 1509   CALCIUM 9.1 01/27/2017 0809   ALKPHOS 40 04/24/2020 1509   ALKPHOS 42 01/27/2017 0809   AST 18 04/24/2020 1509   AST 10 01/27/2017 0809   ALT 22 04/24/2020 1509   ALT 18 01/27/2017 0809   BILITOT 0.6 04/24/2020 1509   BILITOT 0.72 01/27/2017 0809

## 2020-05-02 NOTE — Assessment & Plan Note (Signed)
His blood pressure is noted to be elevated It could be related to recent use of ibuprofen I recommend close follow-up with primary care doctor and lifestyle changes

## 2020-05-02 NOTE — Assessment & Plan Note (Signed)
I have reviewed blood work with the patient briefly He is still in remission Despite being off Pomalyst in Feb 2021, his recent myeloma panel was within normal limits I recommend close follow-up every 4 months I recommend he continues to take calcium and vitamin D due to history of significant bone fracture

## 2020-08-28 ENCOUNTER — Other Ambulatory Visit: Payer: Self-pay

## 2020-08-28 ENCOUNTER — Inpatient Hospital Stay: Payer: Medicare (Managed Care) | Attending: Hematology and Oncology

## 2020-08-28 DIAGNOSIS — G8929 Other chronic pain: Secondary | ICD-10-CM | POA: Diagnosis not present

## 2020-08-28 DIAGNOSIS — Z9221 Personal history of antineoplastic chemotherapy: Secondary | ICD-10-CM | POA: Insufficient documentation

## 2020-08-28 DIAGNOSIS — Z9484 Stem cells transplant status: Secondary | ICD-10-CM | POA: Diagnosis not present

## 2020-08-28 DIAGNOSIS — M549 Dorsalgia, unspecified: Secondary | ICD-10-CM | POA: Insufficient documentation

## 2020-08-28 DIAGNOSIS — C9000 Multiple myeloma not having achieved remission: Secondary | ICD-10-CM

## 2020-08-28 DIAGNOSIS — C9001 Multiple myeloma in remission: Secondary | ICD-10-CM | POA: Insufficient documentation

## 2020-08-28 DIAGNOSIS — R030 Elevated blood-pressure reading, without diagnosis of hypertension: Secondary | ICD-10-CM | POA: Diagnosis not present

## 2020-08-28 LAB — COMPREHENSIVE METABOLIC PANEL
ALT: 22 U/L (ref 0–44)
AST: 15 U/L (ref 15–41)
Albumin: 4 g/dL (ref 3.5–5.0)
Alkaline Phosphatase: 46 U/L (ref 38–126)
Anion gap: 8 (ref 5–15)
BUN: 20 mg/dL (ref 6–20)
CO2: 24 mmol/L (ref 22–32)
Calcium: 9.5 mg/dL (ref 8.9–10.3)
Chloride: 108 mmol/L (ref 98–111)
Creatinine, Ser: 1.12 mg/dL (ref 0.61–1.24)
GFR, Estimated: >60 mL/min (ref >60)
Glucose, Bld: 130 mg/dL — ABNORMAL HIGH (ref 70–99)
Potassium: 3.7 mmol/L (ref 3.5–5.1)
Sodium: 140 mmol/L (ref 135–145)
Total Bilirubin: 0.4 mg/dL (ref 0.3–1.2)
Total Protein: 7 g/dL (ref 6.5–8.1)

## 2020-08-28 LAB — CBC WITH DIFFERENTIAL/PLATELET
Abs Immature Granulocytes: 0.01 10*3/uL (ref 0.00–0.07)
Basophils Absolute: 0.1 10*3/uL (ref 0.0–0.1)
Basophils Relative: 1 %
Eosinophils Absolute: 0.2 10*3/uL (ref 0.0–0.5)
Eosinophils Relative: 3 %
HCT: 38.1 % — ABNORMAL LOW (ref 39.0–52.0)
Hemoglobin: 13.2 g/dL (ref 13.0–17.0)
Immature Granulocytes: 0 %
Lymphocytes Relative: 43 %
Lymphs Abs: 2.6 10*3/uL (ref 0.7–4.0)
MCH: 30.6 pg (ref 26.0–34.0)
MCHC: 34.6 g/dL (ref 30.0–36.0)
MCV: 88.2 fL (ref 80.0–100.0)
Monocytes Absolute: 0.4 10*3/uL (ref 0.1–1.0)
Monocytes Relative: 7 %
Neutro Abs: 2.8 10*3/uL (ref 1.7–7.7)
Neutrophils Relative %: 46 %
Platelets: 167 10*3/uL (ref 150–400)
RBC: 4.32 MIL/uL (ref 4.22–5.81)
RDW: 12.4 % (ref 11.5–15.5)
WBC: 6 10*3/uL (ref 4.0–10.5)
nRBC: 0 % (ref 0.0–0.2)

## 2020-08-29 LAB — KAPPA/LAMBDA LIGHT CHAINS
Kappa free light chain: 23.4 mg/L — ABNORMAL HIGH (ref 3.3–19.4)
Kappa, lambda light chain ratio: 1 (ref 0.26–1.65)
Lambda free light chains: 23.5 mg/L (ref 5.7–26.3)

## 2020-08-30 LAB — MULTIPLE MYELOMA PANEL, SERUM
Albumin SerPl Elph-Mcnc: 4 g/dL (ref 2.9–4.4)
Albumin/Glob SerPl: 1.5 (ref 0.7–1.7)
Alpha 1: 0.2 g/dL (ref 0.0–0.4)
Alpha2 Glob SerPl Elph-Mcnc: 0.6 g/dL (ref 0.4–1.0)
B-Globulin SerPl Elph-Mcnc: 0.8 g/dL (ref 0.7–1.3)
Gamma Glob SerPl Elph-Mcnc: 1.2 g/dL (ref 0.4–1.8)
Globulin, Total: 2.7 g/dL (ref 2.2–3.9)
IgA: 111 mg/dL (ref 90–386)
IgG (Immunoglobin G), Serum: 1302 mg/dL (ref 603–1613)
IgM (Immunoglobulin M), Srm: 53 mg/dL (ref 20–172)
Total Protein ELP: 6.7 g/dL (ref 6.0–8.5)

## 2020-09-04 ENCOUNTER — Telehealth: Payer: Self-pay

## 2020-09-04 ENCOUNTER — Inpatient Hospital Stay: Payer: Medicare (Managed Care) | Admitting: Hematology and Oncology

## 2020-09-04 ENCOUNTER — Encounter: Payer: Self-pay | Admitting: Hematology and Oncology

## 2020-09-04 NOTE — Telephone Encounter (Signed)
Attempted to call regarding today's missed call. Unable to leave a message, mailbox full.

## 2020-09-04 NOTE — Telephone Encounter (Signed)
Called and scheduled appt for 8/12 at 0905. He is aware of appt time/date.

## 2020-09-08 ENCOUNTER — Other Ambulatory Visit: Payer: Self-pay

## 2020-09-08 ENCOUNTER — Inpatient Hospital Stay (HOSPITAL_BASED_OUTPATIENT_CLINIC_OR_DEPARTMENT_OTHER): Payer: Medicare (Managed Care) | Admitting: Hematology and Oncology

## 2020-09-08 ENCOUNTER — Encounter: Payer: Self-pay | Admitting: Hematology and Oncology

## 2020-09-08 DIAGNOSIS — R030 Elevated blood-pressure reading, without diagnosis of hypertension: Secondary | ICD-10-CM

## 2020-09-08 DIAGNOSIS — C9001 Multiple myeloma in remission: Secondary | ICD-10-CM | POA: Diagnosis not present

## 2020-09-08 DIAGNOSIS — M549 Dorsalgia, unspecified: Secondary | ICD-10-CM | POA: Diagnosis not present

## 2020-09-08 DIAGNOSIS — G8929 Other chronic pain: Secondary | ICD-10-CM

## 2020-09-08 NOTE — Assessment & Plan Note (Signed)
He will continue calcium with vitamin D

## 2020-09-08 NOTE — Assessment & Plan Note (Signed)
I have reviewed blood work with the patient  He is still in remission Despite being off Pomalyst in Feb 2021, his recent myeloma panel was within normal limits I recommend return appointment in 40-month I recommend he continues to take calcium and vitamin D due to history of significant bone fracture

## 2020-09-08 NOTE — Progress Notes (Signed)
[facility] OFFICE PROGRESS NOTE  Patient Care Team: [name], MD as PCP - General (Internal Medicine)  ASSESSMENT & PLAN:  Multiple myeloma in remission [facility]) I have reviewed blood work with the patient  He is still in remission Despite being off Pomalyst in Feb 2021, his recent myeloma panel was within normal limits I recommend return appointment in 62-month I recommend he continues to take calcium and vitamin D due to history of significant bone fracture   Elevated BP without diagnosis of hypertension I suspect he has hypertension I recommend the patient to follow-up with primary care doctor for medical management We also discussed importance of dietary modification and weight loss as well as exercise  Chronic back pain greater than 3 months duration He will continue calcium with vitamin D  No orders of the defined types were placed in this encounter.   All questions were answered. The patient knows to call the clinic with any problems, questions or concerns. The total time spent in the appointment was 20 minutes encounter with patients including review of chart and various tests results, discussions about plan of care and coordination of care plan   NHeath Lark MD 09/08/2020 9:19 AM  INTERVAL HISTORY: Please see below for problem oriented charting. He returns for further follow-up He is doing well except for persistent chronic back pain He has noted intermittent elevated blood pressure No recent infection, fever or chills  SUMMARY OF ONCOLOGIC HISTORY: Oncology History  [place]  06/23/2015 - 06/28/2015 Hospital Admission   The patient was admitted to the hospital due to gait ataxia and back pain. He was subsequently found to have cord compression underwent surgery and was discharged home   06/24/2015 Imaging   Abnormal appearance of the T6 vertebral body, highly suspicious for possible osseous metastasis. Associated  pathologic fracture withup to 30% height loss. There is associated abnormal soft tissue density within the ventral epidural space,   06/24/2015 Imaging   MRI lumbar: Focal osseous lesion with abnormal enhancement involving the right pedicle of L3, suspicious for possible osseous metastasisgiven the findings in the thoracic spine. Question additional focal lesion within the right iliac wing as above.     06/25/2015 Pathology Results   Accession: [id number] biopsy come from plasma cell neoplasm.   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 Zometa is not given due to inability to get dental clearance   12/14/2015 - 04/24/2016 Chemotherapy   He is started on maintenance treatment with Revlimid only   12/18/2015 Imaging   MRI thoracic and lumbar spine showed numerous enhancing foci throughout the thoracic and lumbar spine with several new small foci in the lumbar spine in comparison with prior MRI compatible with metastatic disease. Stable loss of height of the T3, T4, and T6 vertebral bodies and new postsurgical changes related to T6 laminectomy. No significant epidural disease or evidence for cord compression. No abnormal enhancement of the spinal cord or cauda equina.   05/02/2016 Bone Marrow Biopsy   Outside bone marrow biopsy showed 20% myeloma involvement   05/16/2016 Procedure   Successful placement of a right internal jugular approach power injectable Port-A-Cath. The catheter is ready for immediate use.   05/21/2016 -  07/03/2016 Chemotherapy   He received Kyprolis, Cytoxan and dexamethasone    07/08/2016 Procedure   Status post CT-guided bone marrow biopsy, with tissue specimen sent to pathology for  complete histopathologic analysis   07/08/2016 Bone Marrow Biopsy   Bone Marrow, Aspirate,Biopsy, and Clot BONE MARROW: - MILDLY HYPERCELLULAR MARROW (60%) WITH PLASMA CELL NEOPLASM - SEE COMMENT PERIPHERAL BLOOD: - NORMOCYTIC ANEMIA Diagnosis Note The marrow is hypercellular with lambda-restricted plasma cells consistent with persistence of the patient's previously diagnosed plasma cell neoplasm. The plasma cells comprise approximately 10-15% of the total marrow cellularity, are enlarged, and arranged in clusters.   08/14/2016 Miscellaneous   He received conditioning treatment with melphalan   08/15/2016 Bone Marrow Transplant   He received autologous stem cell transplant   08/24/2016 - 08/29/2016 Hospital Admission   His post-transplant course was complicated by E-Coli bacteremia   11/26/2016 PET scan   PET CT at [facility] 1. Technically limited study due to soft tissue uptake. 2. New hypermetabolic uptake at C7 spinous process and left proximal femur that is of questionable significance in absence of underlying CT correlate. Further assessment with whole body bone scan may be considered. 2. Redemonstrated nonhypermetabolic multifocal lucent and sclerotic lesions throughout the spine which are similar to prior.    12/02/2016 Bone Marrow Biopsy   He had repeat bone marrow biopsy [facility] An immunohistochemical stain for CD138 is performed on the bone marrow core biopsy demonstrates increased plasma cells with focal clustering (10-20% overall), which are monotypic for lambda light chain by in situ hybridization.   01/06/2017 - 06/09/2017 Chemotherapy   He received weekly Dexamethasone, Pomalyst days 1-21 and Daratumumab. From 06/08/17 onwards, he is placed on maintenance Pomalyst only.  He self discontinue Pomalyst in February 2021   07/23/2017 Procedure   Successful right IJ vein Port-A-Cath explant.     REVIEW OF SYSTEMS:   Constitutional: Denies fevers, chills or abnormal weight  loss Eyes: Denies blurriness of vision Ears, nose, mouth, throat, and face: Denies mucositis or sore throat Respiratory: Denies cough, dyspnea or wheezes Cardiovascular: Denies palpitation, chest discomfort or lower extremity swelling Gastrointestinal:  Denies nausea, heartburn or change in bowel habits Skin: Denies abnormal skin rashes Lymphatics: Denies new lymphadenopathy or easy bruising Neurological:Denies numbness, tingling or new weaknesses Behavioral/Psych: Mood is stable, no new changes  All other systems were reviewed with the patient and are negative.  I have reviewed the past medical history, past surgical history, social history and family history with the patient and they are unchanged from previous note.  ALLERGIES:  has No Known Allergies.  MEDICATIONS:  Current Outpatient Medications  Medication Sig Dispense Refill   acetaminophen (TYLENOL) 500 MG tablet Take 500 mg by mouth every 6 (six) hours as needed for moderate pain.     calcium carbonate (TUMS - DOSED IN MG ELEMENTAL CALCIUM) 500 MG chewable tablet Chew 1 tablet by mouth 3 (three) times daily.     cholecalciferol (VITAMIN D3) 25 MCG (1000 UT) tablet Take 1,000 Units by mouth daily.     ibuprofen (ADVIL,MOTRIN) 800 MG tablet Take 800 mg by mouth every 8 (eight) hours as needed (pain).     No current facility-administered medications for this visit.    PHYSICAL EXAMINATION: ECOG PERFORMANCE STATUS: 1 - Symptomatic but completely ambulatory  Vitals:   09/08/20 0910  BP: (!) 141/100  Pulse: 66  Resp: 18  Temp: (!) 97.4 F (36.3 C)  SpO2: 100%   Filed Weights   09/08/20 0910  Weight:  230 lb 3.2 oz (104.4 kg)    GENERAL:alert, no distress and comfortable NEURO: alert & oriented x 3 with fluent speech, no focal motor/sensory deficits  LABORATORY DATA:  I have reviewed the data as listed    Component Value Date/Time   NA 140 08/28/2020 0837   NA 139 01/27/2017 0809   K 3.7 08/28/2020 0837   K 4.1  01/27/2017 0809   CL 108 08/28/2020 0837   CO2 24 08/28/2020 0837   CO2 23 01/27/2017 0809   GLUCOSE 130 (H) 08/28/2020 0837   GLUCOSE 122 01/27/2017 0809   BUN 20 08/28/2020 0837   BUN 14.3 01/27/2017 0809   CREATININE 1.12 08/28/2020 0837   CREATININE 0.9 01/27/2017 0809   CALCIUM 9.5 08/28/2020 0837   CALCIUM 9.1 01/27/2017 0809   PROT 7.0 08/28/2020 0837   PROT 6.2 (L) 01/27/2017 0809   ALBUMIN 4.0 08/28/2020 0837   ALBUMIN 3.7 01/27/2017 0809   AST 15 08/28/2020 0837   AST 10 01/27/2017 0809   ALT 22 08/28/2020 0837   ALT 18 01/27/2017 0809   ALKPHOS 46 08/28/2020 0837   ALKPHOS 42 01/27/2017 0809   BILITOT 0.4 08/28/2020 0837   BILITOT 0.72 01/27/2017 0809   GFRNONAA >60 08/28/2020 0837   GFRAA >60 10/18/2019 1017    No results found for: SPEP, UPEP  Lab Results  Component Value Date   WBC 6.0 08/28/2020   NEUTROABS 2.8 08/28/2020   HGB 13.2 08/28/2020   HCT 38.1 (L) 08/28/2020   MCV 88.2 08/28/2020   PLT 167 08/28/2020      Chemistry      Component Value Date/Time   NA 140 08/28/2020 0837   NA 139 01/27/2017 0809   K 3.7 08/28/2020 0837   K 4.1 01/27/2017 0809   CL 108 08/28/2020 0837   CO2 24 08/28/2020 0837   CO2 23 01/27/2017 0809   BUN 20 08/28/2020 0837   BUN 14.3 01/27/2017 0809   CREATININE 1.12 08/28/2020 0837   CREATININE 0.9 01/27/2017 0809      Component Value Date/Time   CALCIUM 9.5 08/28/2020 0837   CALCIUM 9.1 01/27/2017 0809   ALKPHOS 46 08/28/2020 0837   ALKPHOS 42 01/27/2017 0809   AST 15 08/28/2020 0837   AST 10 01/27/2017 0809   ALT 22 08/28/2020 0837   ALT 18 01/27/2017 0809   BILITOT 0.4 08/28/2020 0837   BILITOT 0.72 01/27/2017 0809

## 2020-09-08 NOTE — Assessment & Plan Note (Signed)
I suspect he has hypertension I recommend the patient to follow-up with primary care doctor for medical management We also discussed importance of dietary modification and weight loss as well as exercise

## 2021-03-02 ENCOUNTER — Other Ambulatory Visit: Payer: Self-pay

## 2021-03-02 DIAGNOSIS — C9001 Multiple myeloma in remission: Secondary | ICD-10-CM

## 2021-03-05 ENCOUNTER — Inpatient Hospital Stay: Payer: No Typology Code available for payment source | Attending: Hematology and Oncology

## 2021-03-05 ENCOUNTER — Other Ambulatory Visit: Payer: Self-pay

## 2021-03-05 DIAGNOSIS — Z9484 Stem cells transplant status: Secondary | ICD-10-CM | POA: Insufficient documentation

## 2021-03-05 DIAGNOSIS — Z9221 Personal history of antineoplastic chemotherapy: Secondary | ICD-10-CM | POA: Diagnosis not present

## 2021-03-05 DIAGNOSIS — Z87891 Personal history of nicotine dependence: Secondary | ICD-10-CM | POA: Insufficient documentation

## 2021-03-05 DIAGNOSIS — G8929 Other chronic pain: Secondary | ICD-10-CM | POA: Insufficient documentation

## 2021-03-05 DIAGNOSIS — M4854XA Collapsed vertebra, not elsewhere classified, thoracic region, initial encounter for fracture: Secondary | ICD-10-CM | POA: Diagnosis not present

## 2021-03-05 DIAGNOSIS — C9001 Multiple myeloma in remission: Secondary | ICD-10-CM | POA: Diagnosis not present

## 2021-03-05 DIAGNOSIS — Z923 Personal history of irradiation: Secondary | ICD-10-CM | POA: Insufficient documentation

## 2021-03-05 DIAGNOSIS — M549 Dorsalgia, unspecified: Secondary | ICD-10-CM | POA: Insufficient documentation

## 2021-03-05 LAB — CBC WITH DIFFERENTIAL (CANCER CENTER ONLY)
Abs Immature Granulocytes: 0 10*3/uL (ref 0.00–0.07)
Basophils Absolute: 0 10*3/uL (ref 0.0–0.1)
Basophils Relative: 1 %
Eosinophils Absolute: 0.2 10*3/uL (ref 0.0–0.5)
Eosinophils Relative: 4 %
HCT: 40.8 % (ref 39.0–52.0)
Hemoglobin: 14.4 g/dL (ref 13.0–17.0)
Immature Granulocytes: 0 %
Lymphocytes Relative: 50 %
Lymphs Abs: 2.5 10*3/uL (ref 0.7–4.0)
MCH: 30.4 pg (ref 26.0–34.0)
MCHC: 35.3 g/dL (ref 30.0–36.0)
MCV: 86.3 fL (ref 80.0–100.0)
Monocytes Absolute: 0.3 10*3/uL (ref 0.1–1.0)
Monocytes Relative: 7 %
Neutro Abs: 1.9 10*3/uL (ref 1.7–7.7)
Neutrophils Relative %: 38 %
Platelet Count: 172 10*3/uL (ref 150–400)
RBC: 4.73 MIL/uL (ref 4.22–5.81)
RDW: 12 % (ref 11.5–15.5)
WBC Count: 4.9 10*3/uL (ref 4.0–10.5)
nRBC: 0 % (ref 0.0–0.2)

## 2021-03-05 LAB — CMP (CANCER CENTER ONLY)
ALT: 29 U/L (ref 0–44)
AST: 21 U/L (ref 15–41)
Albumin: 4.5 g/dL (ref 3.5–5.0)
Alkaline Phosphatase: 39 U/L (ref 38–126)
Anion gap: 7 (ref 5–15)
BUN: 17 mg/dL (ref 6–20)
CO2: 26 mmol/L (ref 22–32)
Calcium: 10 mg/dL (ref 8.9–10.3)
Chloride: 104 mmol/L (ref 98–111)
Creatinine: 1.08 mg/dL (ref 0.61–1.24)
GFR, Estimated: >60 mL/min (ref >60)
Glucose, Bld: 95 mg/dL (ref 70–99)
Potassium: 3.7 mmol/L (ref 3.5–5.1)
Sodium: 137 mmol/L (ref 135–145)
Total Bilirubin: 0.6 mg/dL (ref 0.3–1.2)
Total Protein: 7.3 g/dL (ref 6.5–8.1)

## 2021-03-06 LAB — KAPPA/LAMBDA LIGHT CHAINS
Kappa free light chain: 18.5 mg/L (ref 3.3–19.4)
Kappa, lambda light chain ratio: 0.44 (ref 0.26–1.65)
Lambda free light chains: 42.5 mg/L — ABNORMAL HIGH (ref 5.7–26.3)

## 2021-03-08 LAB — MULTIPLE MYELOMA PANEL, SERUM
Albumin SerPl Elph-Mcnc: 3.9 g/dL (ref 2.9–4.4)
Albumin/Glob SerPl: 1.4 (ref 0.7–1.7)
Alpha 1: 0.2 g/dL (ref 0.0–0.4)
Alpha2 Glob SerPl Elph-Mcnc: 0.6 g/dL (ref 0.4–1.0)
B-Globulin SerPl Elph-Mcnc: 0.9 g/dL (ref 0.7–1.3)
Gamma Glob SerPl Elph-Mcnc: 1.2 g/dL (ref 0.4–1.8)
Globulin, Total: 2.9 g/dL (ref 2.2–3.9)
IgA: 109 mg/dL (ref 90–386)
IgG (Immunoglobin G), Serum: 1207 mg/dL (ref 603–1613)
IgM (Immunoglobulin M), Srm: 41 mg/dL (ref 20–172)
Total Protein ELP: 6.8 g/dL (ref 6.0–8.5)

## 2021-03-12 ENCOUNTER — Other Ambulatory Visit: Payer: Self-pay

## 2021-03-12 ENCOUNTER — Inpatient Hospital Stay (HOSPITAL_BASED_OUTPATIENT_CLINIC_OR_DEPARTMENT_OTHER): Payer: No Typology Code available for payment source | Admitting: Hematology and Oncology

## 2021-03-12 ENCOUNTER — Encounter: Payer: Self-pay | Admitting: Hematology and Oncology

## 2021-03-12 VITALS — BP 143/101 | HR 91 | Resp 18 | Ht 74.0 in | Wt 243.2 lb

## 2021-03-12 DIAGNOSIS — C9001 Multiple myeloma in remission: Secondary | ICD-10-CM

## 2021-03-12 DIAGNOSIS — M549 Dorsalgia, unspecified: Secondary | ICD-10-CM

## 2021-03-12 DIAGNOSIS — G8929 Other chronic pain: Secondary | ICD-10-CM

## 2021-03-12 NOTE — Progress Notes (Signed)
[place] OFFICE PROGRESS NOTE  Patient Care Team: [name], MD as PCP - General (Internal Medicine)  ASSESSMENT & PLAN:  Multiple myeloma in remission [facility]) I have reviewed all his blood work The cause of slightly elevated lambda light chain is unknown but not likely caused by multiple myeloma relapse I am concerned about his severe back pain, leading to loss of job Even without signs to suggest cancer recurrence, he could potentially have new vertebral fractures He has no neurological deficits Recommend MRI of the spine for evaluation and he is in agreement I gave him instruction to call to schedule the MRI  Chronic back pain greater than 3 months duration He has a history of severe vertebral body involvement leading to neurosurgical intervention several years ago I am concerned about his new onset of pain, appears to be acute on chronic in nature I recommend MRI for evaluation and he is in agreement  Orders Placed This Encounter  Procedures   MR TOTAL SPINE METS SCREENING    Standing Status:   Future    Standing Expiration Date:   03/12/2022    Order Specific Question:   What is the patient's sedation requirement?    Answer:   No Sedation    Order Specific Question:   Does the patient have a pacemaker or implanted devices?    Answer:   No    Order Specific Question:   Preferred imaging location?    Answer:   [facility] (table limit - 550 lbs)    All questions were answered. The patient knows to call the clinic with any problems, questions or concerns. The total time spent in the appointment was 30 minutes encounter with patients including review of chart and various tests results, discussions about plan of care and coordination of care plan   [name], MD 03/12/2021 1:19 PM  INTERVAL HISTORY: Please see below for problem oriented charting. he returns for surveillance follow-up for history of multiple myeloma He has lost his job recently  due to severe back pain He admits he might be lifting something heavy at work but denies trauma He denies neurological deficits His appetite is fair and he has gained a lot of weight since last time I saw him  REVIEW OF SYSTEMS:   Constitutional: Denies fevers, chills or abnormal weight loss Eyes: Denies blurriness of vision Ears, nose, mouth, throat, and face: Denies mucositis or sore throat Respiratory: Denies cough, dyspnea or wheezes Cardiovascular: Denies palpitation, chest discomfort or lower extremity swelling Gastrointestinal:  Denies nausea, heartburn or change in bowel habits Skin: Denies abnormal skin rashes Lymphatics: Denies new lymphadenopathy or easy bruising Neurological:Denies numbness, tingling or new weaknesses Behavioral/Psych: Mood is stable, no new changes  All other systems were reviewed with the patient and are negative.  I have reviewed the past medical history, past surgical history, social history and family history with the patient and they are unchanged from previous note.  ALLERGIES:  has No Known Allergies.  MEDICATIONS:  Current Outpatient Medications  Medication Sig Dispense Refill   acetaminophen (TYLENOL) 500 MG tablet Take 500 mg by mouth every 6 (six) hours as needed for moderate pain.     calcium carbonate (TUMS - DOSED IN MG ELEMENTAL CALCIUM) 500 MG chewable tablet Chew 1 tablet by mouth 3 (three) times daily.     cholecalciferol (VITAMIN D3) 25 MCG (1000 UT) tablet Take 1,000 Units by mouth daily.     ibuprofen (ADVIL,MOTRIN) 800 MG tablet  Take 800 mg by mouth every 8 (eight) hours as needed (pain).     No current facility-administered medications for this visit.    SUMMARY OF ONCOLOGIC HISTORY: Oncology History  Multiple myeloma in remission [facility])  06/23/2015 - 06/28/2015 Hospital Admission   The patient was admitted to the hospital due to gait ataxia and back pain. He was subsequently found to have cord compression underwent surgery and  was discharged home   06/24/2015 Imaging   Abnormal appearance of the T6 vertebral body, highly suspicious for possible osseous metastasis. Associated pathologic fracture withup to 30% height loss. There is associated abnormal soft tissue density within the ventral epidural space,   06/24/2015 Imaging   MRI lumbar: Focal osseous lesion with abnormal enhancement involving the right pedicle of L3, suspicious for possible osseous metastasisgiven the findings in the thoracic spine. Question additional focal lesion within the right iliac wing as above.     06/25/2015 Pathology Results   Accession: [id number] bone biopsy come from plasma cell neoplasm.   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 Zometa is not given due to inability to get dental clearance   12/14/2015 - 04/24/2016 Chemotherapy   He is started on maintenance treatment with Revlimid only   12/18/2015 Imaging   MRI thoracic and lumbar spine showed numerous enhancing foci throughout the thoracic and lumbar spine with several new small foci in the lumbar spine in comparison with prior MRI compatible with metastatic disease. Stable loss of height of the T3, T4, and T6 vertebral bodies and new postsurgical changes related to T6 laminectomy. No significant epidural disease or evidence for cord compression. No abnormal enhancement of the spinal cord or cauda equina.   05/02/2016 Bone Marrow Biopsy   Outside bone marrow biopsy showed 20% myeloma involvement   05/16/2016 Procedure   Successful placement of a right internal jugular approach power injectable Port-A-Cath. The catheter is ready for immediate use.   05/21/2016 - 07/03/2016 Chemotherapy   He received  Kyprolis, Cytoxan and dexamethasone    07/08/2016 Procedure   Status post CT-guided bone marrow biopsy, with tissue specimen sent to pathology for complete histopathologic analysis   07/08/2016 Bone Marrow Biopsy   Bone Marrow, Aspirate,Biopsy, and Clot BONE MARROW: - MILDLY HYPERCELLULAR MARROW (60%) WITH PLASMA CELL NEOPLASM - SEE COMMENT PERIPHERAL BLOOD: - NORMOCYTIC ANEMIA Diagnosis Note The marrow is hypercellular with lambda-restricted plasma cells consistent with persistence of the patient's previously diagnosed plasma cell neoplasm. The plasma cells comprise approximately 10-15% of the total marrow cellularity, are enlarged, and arranged in clusters.   08/14/2016 Miscellaneous   He received conditioning treatment with melphalan   08/15/2016 Bone Marrow Transplant   He received autologous stem cell transplant   08/24/2016 - 08/29/2016 Hospital Admission   His post-transplant course was complicated by E-Coli bacteremia   11/26/2016 PET scan   PET CT at [facility] 1. Technically limited study due to soft tissue uptake. 2. New hypermetabolic uptake at C7 spinous process and left proximal femur that is of questionable significance in absence of underlying CT correlate. Further assessment with whole body bone scan may be considered. 2. Redemonstrated nonhypermetabolic multifocal lucent and sclerotic lesions throughout the spine which are similar to prior.    12/02/2016 Bone  Marrow Biopsy   He had repeat bone marrow biopsy at [facility] An immunohistochemical stain for CD138 is performed on the bone marrow core biopsy demonstrates increased plasma cells with focal clustering (10-20% overall), which are monotypic for lambda light chain by in situ hybridization.   01/06/2017 - 06/09/2017 Chemotherapy   He received weekly Dexamethasone, Pomalyst days 1-21 and Daratumumab. From 06/08/17 onwards, he is placed on maintenance Pomalyst only.  He self discontinue Pomalyst in February 2021   07/23/2017  Procedure   Successful right IJ vein Port-A-Cath explant.     PHYSICAL EXAMINATION: ECOG PERFORMANCE STATUS: 1 - Symptomatic but completely ambulatory  Vitals:   03/12/21 1021  BP: (!) 143/101  Pulse: 91  Resp: 18  SpO2: 100%   Filed Weights   03/12/21 1021  Weight: 243 lb 3.2 oz (110.3 kg)    GENERAL:alert, no distress and comfortable NEURO: alert & oriented x 3 with fluent speech, no focal motor/sensory deficits  LABORATORY DATA:  I have reviewed the data as listed    Component Value Date/Time   NA 137 03/05/2021 1002   NA 139 01/27/2017 0809   K 3.7 03/05/2021 1002   K 4.1 01/27/2017 0809   CL 104 03/05/2021 1002   CO2 26 03/05/2021 1002   CO2 23 01/27/2017 0809   GLUCOSE 95 03/05/2021 1002   GLUCOSE 122 01/27/2017 0809   BUN 17 03/05/2021 1002   BUN 14.3 01/27/2017 0809   CREATININE 1.08 03/05/2021 1002   CREATININE 0.9 01/27/2017 0809   CALCIUM 10.0 03/05/2021 1002   CALCIUM 9.1 01/27/2017 0809   PROT 7.3 03/05/2021 1002   PROT 6.2 (L) 01/27/2017 0809   ALBUMIN 4.5 03/05/2021 1002   ALBUMIN 3.7 01/27/2017 0809   AST 21 03/05/2021 1002   AST 10 01/27/2017 0809   ALT 29 03/05/2021 1002   ALT 18 01/27/2017 0809   ALKPHOS 39 03/05/2021 1002   ALKPHOS 42 01/27/2017 0809   BILITOT 0.6 03/05/2021 1002   BILITOT 0.72 01/27/2017 0809   GFRNONAA >60 03/05/2021 1002   GFRAA >60 10/18/2019 1017    No results found for: SPEP, UPEP  Lab Results  Component Value Date   WBC 4.9 03/05/2021   NEUTROABS 1.9 03/05/2021   HGB 14.4 03/05/2021   HCT 40.8 03/05/2021   MCV 86.3 03/05/2021   PLT 172 03/05/2021      Chemistry      Component Value Date/Time   NA 137 03/05/2021 1002   NA 139 01/27/2017 0809   K 3.7 03/05/2021 1002   K 4.1 01/27/2017 0809   CL 104 03/05/2021 1002   CO2 26 03/05/2021 1002   CO2 23 01/27/2017 0809   BUN 17 03/05/2021 1002   BUN 14.3 01/27/2017 0809   CREATININE 1.08 03/05/2021 1002   CREATININE 0.9 01/27/2017 0809       Component Value Date/Time   CALCIUM 10.0 03/05/2021 1002   CALCIUM 9.1 01/27/2017 0809   ALKPHOS 39 03/05/2021 1002   ALKPHOS 42 01/27/2017 0809   AST 21 03/05/2021 1002   AST 10 01/27/2017 0809   ALT 29 03/05/2021 1002   ALT 18 01/27/2017 0809   BILITOT 0.6 03/05/2021 1002   BILITOT 0.72 01/27/2017 0809

## 2021-03-12 NOTE — Assessment & Plan Note (Signed)
He has a history of severe vertebral body involvement leading to neurosurgical intervention several years ago I am concerned about his new onset of pain, appears to be acute on chronic in nature I recommend MRI for evaluation and he is in agreement

## 2021-03-12 NOTE — Assessment & Plan Note (Signed)
I have reviewed all his blood work The cause of slightly elevated lambda light chain is unknown but not likely caused by multiple myeloma relapse I am concerned about his severe back pain, leading to loss of job Even without signs to suggest cancer recurrence, he could potentially have new vertebral fractures He has no neurological deficits Recommend MRI of the spine for evaluation and he is in agreement I gave him instruction to call to schedule the MRI

## 2021-03-14 ENCOUNTER — Telehealth: Payer: Self-pay

## 2021-03-14 NOTE — Telephone Encounter (Signed)
-----   Message from [name], MD sent at 03/14/2021  8:48 AM EST ----- Can you remind him to call to schedule MRI? We do not schedule MRI for patients, he needs to call Once he has scheduled it, I can see him back

## 2021-03-14 NOTE — Telephone Encounter (Signed)
Called pt per MD. Pt was provided with number to [facility] scheduling; states he will call to schedule then call our office to schedule f/u w/MD

## 2021-03-15 ENCOUNTER — Telehealth: Payer: Self-pay

## 2021-03-15 NOTE — Telephone Encounter (Signed)
Attempted to call and schedule appt. Mailbox full and unable to leave a message.  Will try to call back later.

## 2021-03-15 NOTE — Telephone Encounter (Signed)
-----   Message from [name], MD sent at 03/15/2021  1:28 PM EST ----- I can see him on Friday 2/24 to review results of MRI

## 2021-03-16 ENCOUNTER — Telehealth: Payer: Self-pay

## 2021-03-16 NOTE — Telephone Encounter (Signed)
-----   Message from [name], MD sent at 03/15/2021  1:28 PM EST ----- I can see him on Friday 2/24 to review results of MRI

## 2021-03-16 NOTE — Telephone Encounter (Signed)
Called and scheduled appt at 0920 on 2/24. He is aware of appt.

## 2021-03-21 ENCOUNTER — Other Ambulatory Visit: Payer: Self-pay

## 2021-03-21 ENCOUNTER — Ambulatory Visit (HOSPITAL_COMMUNITY)
Admission: RE | Admit: 2021-03-21 | Discharge: 2021-03-21 | Disposition: A | Discharge status: Home / Self Care | Payer: No Typology Code available for payment source | Source: Ambulatory Visit | Attending: Hematology and Oncology | Admitting: Hematology and Oncology

## 2021-03-21 DIAGNOSIS — M5124 Other intervertebral disc displacement, thoracic region: Secondary | ICD-10-CM | POA: Diagnosis not present

## 2021-03-21 DIAGNOSIS — M48061 Spinal stenosis, lumbar region without neurogenic claudication: Secondary | ICD-10-CM | POA: Diagnosis not present

## 2021-03-21 DIAGNOSIS — G8929 Other chronic pain: Secondary | ICD-10-CM | POA: Insufficient documentation

## 2021-03-21 DIAGNOSIS — C9000 Multiple myeloma not having achieved remission: Secondary | ICD-10-CM | POA: Diagnosis not present

## 2021-03-21 DIAGNOSIS — C9001 Multiple myeloma in remission: Secondary | ICD-10-CM | POA: Diagnosis not present

## 2021-03-21 DIAGNOSIS — M549 Dorsalgia, unspecified: Secondary | ICD-10-CM | POA: Diagnosis not present

## 2021-03-21 MED ORDER — GADOBUTROL 1 MMOL/ML IV SOLN
10.0000 mL | Freq: Once | INTRAVENOUS | Status: AC | PRN
  Administered 2021-03-21: 10 mL via INTRAVENOUS

## 2021-03-23 ENCOUNTER — Other Ambulatory Visit: Payer: Self-pay

## 2021-03-23 ENCOUNTER — Encounter: Payer: Self-pay | Admitting: Hematology and Oncology

## 2021-03-23 ENCOUNTER — Inpatient Hospital Stay (HOSPITAL_BASED_OUTPATIENT_CLINIC_OR_DEPARTMENT_OTHER): Payer: No Typology Code available for payment source | Admitting: Hematology and Oncology

## 2021-03-23 ENCOUNTER — Telehealth: Payer: Self-pay

## 2021-03-23 ENCOUNTER — Telehealth: Payer: Self-pay | Admitting: Internal Medicine

## 2021-03-23 VITALS — BP 153/102 | HR 78 | Temp 98.1°F | Resp 18 | Ht 74.0 in | Wt 244.0 lb

## 2021-03-23 DIAGNOSIS — C9001 Multiple myeloma in remission: Secondary | ICD-10-CM | POA: Diagnosis not present

## 2021-03-23 DIAGNOSIS — M549 Dorsalgia, unspecified: Secondary | ICD-10-CM | POA: Diagnosis not present

## 2021-03-23 DIAGNOSIS — G8929 Other chronic pain: Secondary | ICD-10-CM

## 2021-03-23 DIAGNOSIS — S22000A Wedge compression fracture of unspecified thoracic vertebra, initial encounter for closed fracture: Secondary | ICD-10-CM | POA: Insufficient documentation

## 2021-03-23 NOTE — Assessment & Plan Note (Signed)
The location of his pain is mainly in the lumbar region, I suspect due to the disc problem However, given his history of surgery and abnormal changes on his MRI spine, I recommend consultation with neuro oncologist as well as neurosurgeon for evaluation, to see if he is a surgical candidate

## 2021-03-23 NOTE — Telephone Encounter (Signed)
Faxed referral to [facility] to Dr. Kathyrn Sheriff, faxed to [phone]. Received confirmation.

## 2021-03-23 NOTE — Progress Notes (Signed)
[place] OFFICE PROGRESS NOTE  Patient Care Team: [name], MD as PCP - General (Internal Medicine)  ASSESSMENT & PLAN:  Multiple myeloma in remission [facility]) His recent myeloma panel showed borderline elevated serum light chain This is not the cause of his new compression fracture From the myeloma standpoint, I plan to see him again in 3 months for further follow-up  Chronic back pain greater than 3 months duration The location of his pain is mainly in the lumbar region, I suspect due to the disc problem However, given his history of surgery and abnormal changes on his MRI spine, I recommend consultation with neuro oncologist as well as neurosurgeon for evaluation, to see if he is a surgical candidate  Compression fracture of body of thoracic vertebra ([place]) He has no compression fractures noted on the vertebral body at T3 and T4 level as well as disc problems in the lumbar region As above, I will consult neurosurgeon for evaluation and management  Orders Placed This Encounter  Procedures   Amb Referral to Neuro Oncology    Referral Priority:   Routine    Referral Type:   Consultation    Referral Reason:   Specialty Services Required    Number of Visits Requested:   1   Ambulatory referral to Neurosurgery    Referral Priority:   Routine    Referral Type:   Surgical    Referral Reason:   Specialty Services Required    Referred to Provider:   Consuella Lose, MD    Requested Specialty:   Neurosurgery    Number of Visits Requested:   1    All questions were answered. The patient knows to call the clinic with any problems, questions or concerns. The total time spent in the appointment was 30 minutes encounter with patients including review of chart and various tests results, discussions about plan of care and coordination of care plan   [name], MD 03/23/2021 10:25 AM  INTERVAL HISTORY: Please see below for problem oriented charting. he returns for  review of recent imaging studies He is complaining of persistent pain on his lower back but more so in the lumbar region He did recall lifting something heavy at work He has no neurological deficits  REVIEW OF SYSTEMS:   Constitutional: Denies fevers, chills or abnormal weight loss Eyes: Denies blurriness of vision Ears, nose, mouth, throat, and face: Denies mucositis or sore throat Respiratory: Denies cough, dyspnea or wheezes Cardiovascular: Denies palpitation, chest discomfort or lower extremity swelling Gastrointestinal:  Denies nausea, heartburn or change in bowel habits Skin: Denies abnormal skin rashes Lymphatics: Denies new lymphadenopathy or easy bruising Neurological:Denies numbness, tingling or new weaknesses Behavioral/Psych: Mood is stable, no new changes  All other systems were reviewed with the patient and are negative.  I have reviewed the past medical history, past surgical history, social history and family history with the patient and they are unchanged from previous note.  ALLERGIES:  has No Known Allergies.  MEDICATIONS:  Current Outpatient Medications  Medication Sig Dispense Refill   acetaminophen (TYLENOL) 500 MG tablet Take 500 mg by mouth every 6 (six) hours as needed for moderate pain.     calcium carbonate (TUMS - DOSED IN MG ELEMENTAL CALCIUM) 500 MG chewable tablet Chew 1 tablet by mouth 3 (three) times daily.     cholecalciferol (VITAMIN D3) 25 MCG (1000 UT) tablet Take 1,000 Units by mouth daily.     ibuprofen (ADVIL,MOTRIN) 800 MG tablet Take  800 mg by mouth every 8 (eight) hours as needed (pain).     No current facility-administered medications for this visit.    SUMMARY OF ONCOLOGIC HISTORY: Oncology History  Multiple myeloma in remission [facility])  06/23/2015 - 06/28/2015 Hospital Admission   The patient was admitted to the hospital due to gait ataxia and back pain. He was subsequently found to have cord compression underwent surgery and was  discharged home   06/24/2015 Imaging   Abnormal appearance of the T6 vertebral body, highly suspicious for possible osseous metastasis. Associated pathologic fracture withup to 30% height loss. There is associated abnormal soft tissue density within the ventral epidural space,   06/24/2015 Imaging   MRI lumbar: Focal osseous lesion with abnormal enhancement involving the right pedicle of L3, suspicious for possible osseous metastasisgiven the findings in the thoracic spine. Question additional focal lesion within the right iliac wing as above.     06/25/2015 Pathology Results   Accession: [id number] bone biopsy come from plasma cell neoplasm.   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 Zometa is not given due to inability to get dental clearance   12/14/2015 - 04/24/2016 Chemotherapy   He is started on maintenance treatment with Revlimid only   12/18/2015 Imaging   MRI thoracic and lumbar spine showed numerous enhancing foci throughout the thoracic and lumbar spine with several new small foci in the lumbar spine in comparison with prior MRI compatible with metastatic disease. Stable loss of height of the T3, T4, and T6 vertebral bodies and new postsurgical changes related to T6 laminectomy. No significant epidural disease or evidence for cord compression. No abnormal enhancement of the spinal cord or cauda equina.   05/02/2016 Bone Marrow Biopsy   Outside bone marrow biopsy showed 20% myeloma involvement   05/16/2016 Procedure   Successful placement of a right internal jugular approach power injectable Port-A-Cath. The catheter is ready for immediate use.   05/21/2016 - 07/03/2016 Chemotherapy   He received  Kyprolis, Cytoxan and dexamethasone    07/08/2016 Procedure   Status post CT-guided bone marrow biopsy, with tissue specimen sent to pathology for complete histopathologic analysis   07/08/2016 Bone Marrow Biopsy   Bone Marrow, Aspirate,Biopsy, and Clot BONE MARROW: - MILDLY HYPERCELLULAR MARROW (60%) WITH PLASMA CELL NEOPLASM - SEE COMMENT PERIPHERAL BLOOD: - NORMOCYTIC ANEMIA Diagnosis Note The marrow is hypercellular with lambda-restricted plasma cells consistent with persistence of the patient's previously diagnosed plasma cell neoplasm. The plasma cells comprise approximately 10-15% of the total marrow cellularity, are enlarged, and arranged in clusters.   08/14/2016 Miscellaneous   He received conditioning treatment with melphalan   08/15/2016 Bone Marrow Transplant   He received autologous stem cell transplant   08/24/2016 - 08/29/2016 Hospital Admission   His post-transplant course was complicated by E-Coli bacteremia   11/26/2016 PET scan   PET CT at [facility] 1. Technically limited study due to soft tissue uptake. 2. New hypermetabolic uptake at C7 spinous process and left proximal femur that is of questionable significance in absence of underlying CT correlate. Further assessment with whole body bone scan may be considered. 2. Redemonstrated nonhypermetabolic multifocal lucent and sclerotic lesions throughout the spine which are similar to prior.    12/02/2016 Bone Marrow  Biopsy   He had repeat bone marrow biopsy at [facility] An immunohistochemical stain for CD138 is performed on the bone marrow core biopsy demonstrates increased plasma cells with focal clustering (10-20% overall), which are monotypic for lambda light chain by in situ hybridization.   01/06/2017 - 06/09/2017 Chemotherapy   He received weekly Dexamethasone, Pomalyst days 1-21 and Daratumumab. From 06/08/17 onwards, he is placed on maintenance Pomalyst only.  He self discontinue Pomalyst in February 2021   07/23/2017  Procedure   Successful right IJ vein Port-A-Cath explant.   03/22/2021 Imaging   1. New large disc protrusion at L4-5 with severe spinal stenosis. 2. History of multiple myeloma with largely resolved diffuse bone marrow heterogeneity since 2017. New enhancing lesions in the L4 and L5 vertebral bodies without acute fracture. 3. Chronic thoracic compression fractures including severe T6 vertebral body height loss which has progressed from 2017. Resolved epidural tumor.       PHYSICAL EXAMINATION: ECOG PERFORMANCE STATUS: 1 - Symptomatic but completely ambulatory  Vitals:   03/23/21 0938  BP: (!) 153/102  Pulse: 78  Resp: 18  Temp: 98.1 F (36.7 C)  SpO2: 100%   Filed Weights   03/23/21 0938  Weight: 244 lb (110.7 kg)    GENERAL:alert, no distress and comfortable NEURO: alert & oriented x 3 with fluent speech, no focal motor/sensory deficits  LABORATORY DATA:  I have reviewed the data as listed    Component Value Date/Time   NA 137 03/05/2021 1002   NA 139 01/27/2017 0809   K 3.7 03/05/2021 1002   K 4.1 01/27/2017 0809   CL 104 03/05/2021 1002   CO2 26 03/05/2021 1002   CO2 23 01/27/2017 0809   GLUCOSE 95 03/05/2021 1002   GLUCOSE 122 01/27/2017 0809   BUN 17 03/05/2021 1002   BUN 14.3 01/27/2017 0809   CREATININE 1.08 03/05/2021 1002   CREATININE 0.9 01/27/2017 0809   CALCIUM 10.0 03/05/2021 1002   CALCIUM 9.1 01/27/2017 0809   PROT 7.3 03/05/2021 1002   PROT 6.2 (L) 01/27/2017 0809   ALBUMIN 4.5 03/05/2021 1002   ALBUMIN 3.7 01/27/2017 0809   AST 21 03/05/2021 1002   AST 10 01/27/2017 0809   ALT 29 03/05/2021 1002   ALT 18 01/27/2017 0809   ALKPHOS 39 03/05/2021 1002   ALKPHOS 42 01/27/2017 0809   BILITOT 0.6 03/05/2021 1002   BILITOT 0.72 01/27/2017 0809   GFRNONAA >60 03/05/2021 1002   GFRAA >60 10/18/2019 1017    No results found for: SPEP, UPEP  Lab Results  Component Value Date   WBC 4.9 03/05/2021   NEUTROABS 1.9 03/05/2021   HGB 14.4  03/05/2021   HCT 40.8 03/05/2021   MCV 86.3 03/05/2021   PLT 172 03/05/2021      Chemistry      Component Value Date/Time   NA 137 03/05/2021 1002   NA 139 01/27/2017 0809   K 3.7 03/05/2021 1002   K 4.1 01/27/2017 0809   CL 104 03/05/2021 1002   CO2 26 03/05/2021 1002   CO2 23 01/27/2017 0809   BUN 17 03/05/2021 1002   BUN 14.3 01/27/2017 0809   CREATININE 1.08 03/05/2021 1002   CREATININE 0.9 01/27/2017 0809      Component Value Date/Time   CALCIUM 10.0 03/05/2021 1002   CALCIUM 9.1 01/27/2017 0809   ALKPHOS 39 03/05/2021 1002   ALKPHOS 42 01/27/2017 0809   AST 21 03/05/2021 1002   AST 10 01/27/2017 0809   ALT 29  03/05/2021 1002   ALT 18 01/27/2017 0809   BILITOT 0.6 03/05/2021 1002   BILITOT 0.72 01/27/2017 0809       RADIOGRAPHIC STUDIES: I have reviewed multiple MRI imaging studies with the patient I have personally reviewed the radiological images as listed and agreed with the findings in the report. MR TOTAL SPINE METS SCREENING  Result Date: 03/22/2021 CLINICAL DATA:  Multiple myeloma.  Chronic back pain. EXAM: MRI TOTAL SPINE WITHOUT AND WITH CONTRAST TECHNIQUE: Multisequence MR imaging of the spine from the cervical spine to the sacrum was performed prior to and following IV contrast administration for evaluation of spinal metastatic disease. CONTRAST:  6m GADAVIST GADOBUTROL 1 MMOL/ML IV SOLN COMPARISON:  MRI thoracic and lumbar spine 12/18/2015 FINDINGS: MRI CERVICAL SPINE FINDINGS Alignment: Cervical spine straightening.  No significant listhesis. Vertebrae: No fracture, suspicious marrow lesion, or significant marrow edema, or evidence of epidural tumor. Cord: Mild-to-moderate impression on the ventral spinal cord by disc bulges at C3-4, C4-5, and C5-6. No definite spinal cord signal abnormality, although assessment is limited by lack of axial imaging through this region. Posterior Fossa, vertebral arteries, paraspinal tissues: Unremarkable. Disc levels: Disc  bulges result in suspected moderate spinal stenosis at C3-4 and C5-6 and severe spinal stenosis at C4-5 with full characterization limited by lack of axial imaging which also limits assessment for neural foraminal stenosis. MRI THORACIC SPINE FINDINGS Alignment: Straightening of the lower thoracic spine with mild focal kyphosis at T6. No listhesis. Vertebrae: Diffuse marrow heterogeneity on the prior MRI has largely resolved. Pathologic T6 compression fracture with severe anterior vertebral body height loss which has progressed from 2017. Heterogeneous marrow signal in the T6 vertebral body with mild patchy enhancement. Epidural tumor at T6 on the prior MRI has resolved. Mild chronic T3 and T4 compression fractures without significant progression from 2017, and with a few subcentimeter foci of enhancement noted in these vertebral bodies. Suspected post radiation fatty marrow changes in the mid and lower thoracic spine. No acute fracture. Cord:  Normal signal. Paraspinal and other soft tissues: Mild enhancement in the posterior soft tissues at T6 at the site of prior posterior decompression. No significant fluid collection. Disc levels: Scattered mild disc bulging in the thoracic spine including at T3-4 and T6-7 without evidence of significant stenosis. MRI LUMBAR SPINE FINDINGS Segmentation:  Standard. Alignment:  Normal. Vertebrae: Diffuse marrow heterogeneity on the prior MRI has largely resolved, however there is a new 2.5 cm T1 hypointense, homogeneously enhancing lesion anteriorly in the L5 vertebral body, and there is a 7 mm enhancing lesion in the posterosuperior aspect of the L4 vertebral body on the left. No fracture or epidural tumor is identified. Conus medullaris: Extends to the upper L2 level and appears normal. Paraspinal and other soft tissues: Unremarkable. Disc levels: At L4-5, disc bulging, a new large T2 hyperintense left paracentral to subarticular disc protrusion, and mild facet and ligamentum  flavum hypertrophy result in severe spinal stenosis. Severe left greater than right lateral recess stenosis, and mild-to-moderate bilateral neural foraminal stenosis. Bilateral neural impingement is possible, particularly impingement of the left L5 nerve root. There is an unchanged small central disc protrusion at L5-S1 without spinal stenosis. Mild bilateral neural foraminal stenosis at L5-S1 due to mild disc bulging and facet hypertrophy is unchanged. IMPRESSION: 1. New large disc protrusion at L4-5 with severe spinal stenosis. 2. History of multiple myeloma with largely resolved diffuse bone marrow heterogeneity since 2017. New enhancing lesions in the L4 and L5 vertebral bodies without acute  fracture. 3. Chronic thoracic compression fractures including severe T6 vertebral body height loss which has progressed from 2017. Resolved epidural tumor. Electronically Signed   By: Logan Bores M.D.   On: 03/22/2021 15:21

## 2021-03-23 NOTE — Telephone Encounter (Signed)
Scheduled appt per 2/24 referral. Pt is aware of appt date and time. Pt is aware to arrive 15 mins prior to appt time and to bring and updated insurance card. Pt is aware of appt location.   °

## 2021-03-23 NOTE — Assessment & Plan Note (Signed)
He has no compression fractures noted on the vertebral body at T3 and T4 level as well as disc problems in the lumbar region As above, I will consult neurosurgeon for evaluation and management

## 2021-03-23 NOTE — Assessment & Plan Note (Signed)
His recent myeloma panel showed borderline elevated serum light chain This is not the cause of his new compression fracture From the myeloma standpoint, I plan to see him again in 3 months for further follow-up

## 2021-03-27 ENCOUNTER — Other Ambulatory Visit: Payer: Self-pay | Admitting: Hematology and Oncology

## 2021-03-27 ENCOUNTER — Inpatient Hospital Stay (HOSPITAL_BASED_OUTPATIENT_CLINIC_OR_DEPARTMENT_OTHER): Payer: No Typology Code available for payment source | Admitting: Internal Medicine

## 2021-03-27 ENCOUNTER — Other Ambulatory Visit: Payer: Self-pay

## 2021-03-27 DIAGNOSIS — M5126 Other intervertebral disc displacement, lumbar region: Secondary | ICD-10-CM | POA: Diagnosis not present

## 2021-03-27 DIAGNOSIS — C9001 Multiple myeloma in remission: Secondary | ICD-10-CM | POA: Diagnosis not present

## 2021-03-27 DIAGNOSIS — S22000A Wedge compression fracture of unspecified thoracic vertebra, initial encounter for closed fracture: Secondary | ICD-10-CM

## 2021-03-27 NOTE — Progress Notes (Signed)
Bradley Hunt at [place] Bradley Hunt, Bradley Hunt   New Patient Evaluation  Date of Service: 03/27/21 Patient Name: Bradley Hunt Patient MRN: [id number] Patient DOB: 21-Sep-1970 Provider: Ventura Sellers, MD  Identifying Statement:  Bradley Hunt is a 51 y.o. male with Herniated intervertebral disc of lumbar spine who Hunt for initial consultation and evaluation regarding cancer associated neurologic deficits.    Referring Provider: Elwyn Hunt, Bradley Hunt,  Bradley Hunt [id number]  Primary Cancer:  Oncologic History: Oncology History  Multiple myeloma in remission [facility])  06/23/2015 - 06/28/2015 Hospital Admission   The patient was admitted to the hospital due to gait ataxia and back pain. He was subsequently found to have cord compression underwent surgery and was discharged home   06/24/2015 Imaging   Abnormal appearance of the T6 vertebral body, highly suspicious for possible osseous metastasis. Associated pathologic fracture withup to 30% height loss. There is associated abnormal soft tissue density within the ventral epidural space,   06/24/2015 Imaging   MRI lumbar: Focal osseous lesion with abnormal enhancement involving the right pedicle of L3, suspicious for possible osseous metastasisgiven the findings in the thoracic spine. Question additional focal lesion within the right iliac wing as above.     06/25/2015 Pathology Results   Accession: [id number] bone biopsy come from plasma cell neoplasm.   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 Zometa is not given due to inability to get dental clearance   12/14/2015 -  04/24/2016 Chemotherapy   He is started on maintenance treatment with Revlimid only   12/18/2015 Imaging   MRI thoracic and lumbar spine showed numerous enhancing foci throughout the thoracic and lumbar spine with several new small foci in the lumbar spine in comparison with prior MRI compatible with metastatic disease. Stable loss of height of the T3, T4, and T6 vertebral bodies and new postsurgical changes related to T6 laminectomy. No significant epidural disease or evidence for cord compression. No abnormal enhancement of the spinal cord or cauda equina.   05/02/2016 Bone Marrow Biopsy   Outside bone marrow biopsy showed 20% myeloma involvement   05/16/2016 Procedure   Successful placement of a right internal jugular approach power injectable Port-A-Cath. The catheter is ready for immediate use.   05/21/2016 - 07/03/2016 Chemotherapy   He received Kyprolis, Cytoxan and dexamethasone    07/08/2016 Procedure   Status post CT-guided bone marrow biopsy, with tissue specimen sent to pathology for complete histopathologic analysis   07/08/2016 Bone Marrow Biopsy   Bone Marrow, Aspirate,Biopsy, and Clot BONE MARROW: - MILDLY HYPERCELLULAR MARROW (60%) WITH PLASMA CELL NEOPLASM - SEE COMMENT PERIPHERAL BLOOD: - NORMOCYTIC ANEMIA Diagnosis Note The marrow is hypercellular with lambda-restricted plasma cells consistent with persistence of the patient's previously diagnosed plasma cell neoplasm. The plasma cells comprise approximately 10-15% of the total marrow cellularity, are enlarged, and arranged in clusters.   08/14/2016 Miscellaneous   He received conditioning treatment with melphalan   08/15/2016 Bone Marrow Transplant   He received autologous stem cell transplant   08/24/2016 - 08/29/2016 Hospital Admission   His post-transplant course was complicated by E-Coli bacteremia   11/26/2016 PET scan   PET CT  at [facility] 1. Technically limited study due to soft tissue uptake. 2. New hypermetabolic  uptake at C7 spinous process and left proximal femur that is of questionable significance in absence of underlying CT correlate. Further assessment with whole body bone scan may be considered. 2. Redemonstrated nonhypermetabolic multifocal lucent and sclerotic lesions throughout the spine which are similar to prior.    12/02/2016 Bone Marrow Biopsy   He had repeat bone marrow biopsy at [facility] An immunohistochemical stain for CD138 is performed on the bone marrow core biopsy demonstrates increased plasma cells with focal clustering (10-20% overall), which are monotypic for lambda light chain by in situ hybridization.   01/06/2017 - 06/09/2017 Chemotherapy   He received weekly Dexamethasone, Pomalyst days 1-21 and Daratumumab. From 06/08/17 onwards, he is placed on maintenance Pomalyst only.  He self discontinue Pomalyst in February 2021   07/23/2017 Procedure   Successful right IJ vein Port-A-Cath explant.   03/22/2021 Imaging   1. New large disc protrusion at L4-5 with severe spinal stenosis. 2. History of multiple myeloma with largely resolved diffuse bone marrow heterogeneity since 2017. New enhancing lesions in the L4 and L5 vertebral bodies without acute fracture. 3. Chronic thoracic compression fractures including severe T6 vertebral body height loss which has progressed from 2017. Resolved epidural tumor.       History of Present Illness: The patient's records from the referring physician were obtained and reviewed and the patient interviewed to confirm this HPI.  Bradley Hunt Hunt today with complaint of back pain.  He describes pain in his midline lower back for the past year, coming and going, but very severe at times.  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  The pain at times will radiate down the back of either leg.  He does not describe weakness or frank numbness.  Underwent spine surgery in 2017  (mid-back) with Dr. Kathyrn Hunt.  Continues on surveillance for multiple myeloma with Dr. Alvy Hunt.  Medications: Current Outpatient Medications on File Prior to Visit  Medication Sig Dispense Refill   calcium carbonate (TUMS - DOSED IN MG ELEMENTAL CALCIUM) 500 MG chewable tablet Chew 1 tablet by mouth 3 (three) times daily.     cholecalciferol (VITAMIN D3) 25 MCG (1000 UT) tablet Take 1,000 Units by mouth daily.     acetaminophen (TYLENOL) 500 MG tablet Take 500 mg by mouth every 6 (six) hours as needed for moderate pain. (Patient not taking: Reported on 03/27/2021)     ibuprofen (ADVIL,MOTRIN) 800 MG tablet Take 800 mg by mouth every 8 (eight) hours as needed (pain). (Patient not taking: Reported on 03/27/2021)     No current facility-administered medications on file prior to visit.    Allergies: No Known Allergies Past Medical History:  Past Medical History:  Diagnosis Date   Bone metastases ([place])    T spine and L spine   History of chemotherapy    History of radiation therapy    Multiple myeloma not having achieved remission ([place]) 06/25/15   Numbness    lower extermities bilat    Past Surgical History:  Past Surgical History:  Procedure Laterality Date   ANTERIOR CRUCIATE LIGAMENT REPAIR     IR FLUORO GUIDE PORT INSERTION RIGHT  05/16/2016   IR REMOVAL TUN ACCESS W/ PORT W/O FL MOD SED  07/23/2017   IR US GUIDE VASC ACCESS RIGHT  05/16/2016   LAMINECTOMY N/A 06/25/2015  Procedure: Thoracic six LAMINECTOMY RESECTION FOR TUMOR;  Surgeon: Consuella Lose, MD;  Location: [facility];  Service: Neurosurgery;  Laterality: N/A;   Social History:  Social History   Socioeconomic History   Marital status: Married    Spouse name: Not on file   Number of children: 0   Years of education: Not on file   Highest education level: Not on file  Occupational History   Not on file  Tobacco Use   Smoking status: Former    Packs/day: 1.00    Years: 10.00    Pack years: 10.00    Types:  Cigarettes    Quit date: 01/28/1997    Years since quitting: 24.1   Smokeless tobacco: Never  Vaping Use   Vaping Use: Never used  Substance and Sexual Activity   Alcohol use: No   Drug use: No   Sexual activity: Yes    Comment: friend Amadou next of kin. 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  Other Topics Concern   Not on file  Social History Narrative   Not on file   Social Determinants of Health   Financial Resource Strain: Not on file  Food Insecurity: Not on file  Transportation Needs: Not on file  Physical Activity: Not on file  Stress: Not on file  Social Connections: Not on file  Intimate Partner Violence: Not on file   Family History:  Family History  Problem Relation Age of Onset   Cancer Neg Hx     Review of Systems: Constitutional: Doesn't report fevers, chills or abnormal weight loss Eyes: Doesn't report blurriness of vision Ears, nose, mouth, throat, and face: Doesn't report sore throat Respiratory: Doesn't report cough, dyspnea or wheezes Cardiovascular: Doesn't report palpitation, chest discomfort  Gastrointestinal:  Doesn't report nausea, constipation, diarrhea GU: Doesn't report incontinence Skin: Doesn't report skin rashes Neurological: Per HPI Musculoskeletal: Doesn't report joint pain Behavioral/Psych: Doesn't report anxiety  Physical Exam: Vitals:   03/27/21 1425  BP: (!) 134/94  Pulse: 95  Resp: 17  Temp: (!) 97.3 F (36.3 C)  SpO2: 95%   KPS: 90. General: Alert, cooperative, pleasant, in no acute distress Head: Normal EENT: No conjunctival injection or scleral icterus.  Lungs: Resp effort normal Cardiac: Regular rate Abdomen: Non-distended abdomen Skin: No rashes cyanosis or petechiae. Extremities: No clubbing or edema  Neurologic Exam: Mental Status: Awake, alert, attentive to examiner. Oriented to self and environment. Language is fluent with intact comprehension.  Cranial Nerves: Visual acuity is grossly normal. Visual fields are  full. Extra-ocular movements intact. No ptosis. Face is symmetric Motor: Tone and bulk are normal. Power is full in both arms and legs. Reflexes are symmetric, no pathologic reflexes present.  Sensory: Intact to light touch Gait: Normal.   Labs: I have reviewed the data as listed    Component Value Date/Time   NA 137 03/05/2021 1002   NA 139 01/27/2017 0809   K 3.7 03/05/2021 1002   K 4.1 01/27/2017 0809   CL 104 03/05/2021 1002   CO2 26 03/05/2021 1002   CO2 23 01/27/2017 0809   GLUCOSE 95 03/05/2021 1002   GLUCOSE 122 01/27/2017 0809   BUN 17 03/05/2021 1002   BUN 14.3 01/27/2017 0809   CREATININE 1.08 03/05/2021 1002   CREATININE 0.9 01/27/2017 0809   CALCIUM 10.0 03/05/2021 1002   CALCIUM 9.1 01/27/2017 0809   PROT 7.3 03/05/2021 1002   PROT 6.2 (L) 01/27/2017 0809   ALBUMIN 4.5 03/05/2021 1002   ALBUMIN 3.7  0809  ° AST 21 03/05/2021 1002  ° AST 10 01/27/2017 0809  ° ALT 29 03/05/2021 1002  ° ALT 18 01/27/2017 0809  ° ALKPHOS 39 03/05/2021 1002  ° ALKPHOS 42 01/27/2017 0809  ° BILITOT 0.6 03/05/2021 1002  ° BILITOT 0.72 01/27/2017 0809  ° GFRNONAA >60 03/05/2021 1002  ° GFRAA >60 10/18/2019 1017  ° °Lab Results  °Component Value Date  ° WBC 4.9 03/05/2021  ° NEUTROABS 1.9 03/05/2021  ° HGB 14.4 03/05/2021  ° HCT 40.8 03/05/2021  ° MCV 86.3 03/05/2021  ° PLT 172 03/05/2021  ° ° °Imaging: ° °MR TOTAL SPINE METS SCREENING ° °Result Date: 03/22/2021 °CLINICAL DATA:  Multiple myeloma.  Chronic back pain. EXAM: MRI TOTAL SPINE WITHOUT AND WITH CONTRAST TECHNIQUE: Multisequence MR imaging of the spine from the cervical spine to the sacrum was performed prior to and following IV contrast administration for evaluation of spinal metastatic disease. CONTRAST:  10mL GADAVIST GADOBUTROL 1 MMOL/ML IV SOLN COMPARISON:  MRI thoracic and lumbar spine 12/18/2015 FINDINGS: MRI CERVICAL SPINE FINDINGS Alignment: Cervical spine straightening.  No significant listhesis. Vertebrae: No fracture,  suspicious marrow lesion, or significant marrow edema, or evidence of epidural tumor. Cord: Mild-to-moderate impression on the ventral spinal cord by disc bulges at C3-4, C4-5, and C5-6. No definite spinal cord signal abnormality, although assessment is limited by lack of axial imaging through this region. Posterior Fossa, vertebral arteries, paraspinal tissues: Unremarkable. Disc levels: Disc bulges result in suspected moderate spinal stenosis at C3-4 and C5-6 and severe spinal stenosis at C4-5 with full characterization limited by lack of axial imaging which also limits assessment for neural foraminal stenosis. MRI THORACIC SPINE FINDINGS Alignment: Straightening of the lower thoracic spine with mild focal kyphosis at T6. No listhesis. Vertebrae: Diffuse marrow heterogeneity on the prior MRI has largely resolved. Pathologic T6 compression fracture with severe anterior vertebral body height loss which has progressed from 2017. Heterogeneous marrow signal in the T6 vertebral body with mild patchy enhancement. Epidural tumor at T6 on the prior MRI has resolved. Mild chronic T3 and T4 compression fractures without significant progression from 2017, and with a few subcentimeter foci of enhancement noted in these vertebral bodies. Suspected post radiation fatty marrow changes in the mid and lower thoracic spine. No acute fracture. Cord:  Normal signal. Paraspinal and other soft tissues: Mild enhancement in the posterior soft tissues at T6 at the site of prior posterior decompression. No significant fluid collection. Disc levels: Scattered mild disc bulging in the thoracic spine including at T3-4 and T6-7 without evidence of significant stenosis. MRI LUMBAR SPINE FINDINGS Segmentation:  Standard. Alignment:  Normal. Vertebrae: Diffuse marrow heterogeneity on the prior MRI has largely resolved, however there is a new 2.5 cm T1 hypointense, homogeneously enhancing lesion anteriorly in the L5 vertebral body, and there is a  7 mm enhancing lesion in the posterosuperior aspect of the L4 vertebral body on the left. No fracture or epidural tumor is identified. Conus medullaris: Extends to the upper L2 level and appears normal. Paraspinal and other soft tissues: Unremarkable. Disc levels: At L4-5, disc bulging, a new large T2 hyperintense left paracentral to subarticular disc protrusion, and mild facet and ligamentum flavum hypertrophy result in severe spinal stenosis. Severe left greater than right lateral recess stenosis, and mild-to-moderate bilateral neural foraminal stenosis. Bilateral neural impingement is possible, particularly impingement of the left L5 nerve root. There is an unchanged small central disc protrusion at L5-S1 without spinal stenosis. Mild bilateral neural foraminal stenosis at   L5-S1 due to mild disc bulging and facet hypertrophy is unchanged. IMPRESSION: 1. New large disc protrusion at L4-5 with severe spinal stenosis. 2. History of multiple myeloma with largely resolved diffuse bone marrow heterogeneity since 2017. New enhancing lesions in the L4 and L5 vertebral bodies without acute fracture. 3. Chronic thoracic compression fractures including severe T6 vertebral body height loss which has progressed from 2017. Resolved epidural tumor. Electronically Signed   By: Allen  Grady M.D.   On: 03/22/2021 15:21   ° ° °Assessment/Plan °Herniated intervertebral disc of lumbar spine ° °Bradley Hunt with clinical and radiographic syndrome consistent with large/severe disc herniation at L4/5 with nerve root compression.  Symptoms have persisted or worsened over months, maybe even an entire year.  There is clear functional limitation with pain associated with sitting/driving. ° °We recommended evaluation with neurosurgery (Nundkumar) for laminectomy.  Pain is managed somewhat adequately with PRN ibuprofen in the interim. ° °He hopes to return to work, and this may be unlikely without intervention here.  Do not suspect  myeloma as active etiology. ° °We spent twenty additional minutes teaching regarding the natural history, biology, and historical experience in the treatment of neurologic complications of cancer.  ° °We appreciate the opportunity to participate in the care of Bradley Hunt.  ° °All questions were answered. The patient knows to call the clinic with any problems, questions or concerns. No barriers to learning were detected. ° °The total time spent in the encounter was 45 minutes and more than 50% was on counseling and review of test results ° ° °Bradley K Vaslow, MD °Medical Director of Neuro-Oncology °[facility] [place] °03/27/21 3:47 PM °

## 2021-04-04 DIAGNOSIS — M5126 Other intervertebral disc displacement, lumbar region: Secondary | ICD-10-CM | POA: Diagnosis not present

## 2021-04-11 ENCOUNTER — Other Ambulatory Visit: Payer: Self-pay | Admitting: Neurosurgery

## 2021-04-11 DIAGNOSIS — M5126 Other intervertebral disc displacement, lumbar region: Secondary | ICD-10-CM

## 2021-04-16 ENCOUNTER — Ambulatory Visit
Admission: RE | Admit: 2021-04-16 | Discharge: 2021-04-16 | Disposition: A | Discharge status: Home / Self Care | Payer: No Typology Code available for payment source | Source: Ambulatory Visit | Attending: Neurosurgery | Admitting: Neurosurgery

## 2021-04-16 DIAGNOSIS — M5126 Other intervertebral disc displacement, lumbar region: Secondary | ICD-10-CM

## 2021-04-16 DIAGNOSIS — M47817 Spondylosis without myelopathy or radiculopathy, lumbosacral region: Secondary | ICD-10-CM | POA: Diagnosis not present

## 2021-04-16 MED ORDER — IOPAMIDOL (ISOVUE-M 200) INJECTION 41%
1.0000 mL | Freq: Once | INTRAMUSCULAR | Status: AC
  Administered 2021-04-16: 1 mL via EPIDURAL

## 2021-04-16 MED ORDER — METHYLPREDNISOLONE ACETATE 40 MG/ML INJ SUSP (RADIOLOG
80.0000 mg | Freq: Once | INTRAMUSCULAR | Status: AC
  Administered 2021-04-16: 80 mg via EPIDURAL

## 2021-04-16 NOTE — Discharge Instructions (Signed)

## 2021-05-23 DIAGNOSIS — Z03818 Encounter for observation for suspected exposure to other biological agents ruled out: Secondary | ICD-10-CM | POA: Diagnosis not present

## 2021-05-23 DIAGNOSIS — Z20822 Contact with and (suspected) exposure to covid-19: Secondary | ICD-10-CM | POA: Diagnosis not present

## 2021-05-23 DIAGNOSIS — R030 Elevated blood-pressure reading, without diagnosis of hypertension: Secondary | ICD-10-CM | POA: Diagnosis not present

## 2021-06-14 ENCOUNTER — Other Ambulatory Visit: Payer: Self-pay

## 2021-06-14 DIAGNOSIS — C9001 Multiple myeloma in remission: Secondary | ICD-10-CM

## 2021-06-18 ENCOUNTER — Inpatient Hospital Stay: Payer: No Typology Code available for payment source | Attending: Internal Medicine

## 2021-06-22 ENCOUNTER — Telehealth: Payer: Self-pay

## 2021-06-22 NOTE — Telephone Encounter (Signed)
Attempted to call patient on primary number regarding missed lab appointment. Call could not be completed. Dr. Alvy Bimler made aware.

## 2021-06-26 ENCOUNTER — Telehealth: Payer: Self-pay

## 2021-06-26 ENCOUNTER — Encounter: Payer: Self-pay | Admitting: Hematology and Oncology

## 2021-06-26 ENCOUNTER — Inpatient Hospital Stay: Payer: No Typology Code available for payment source | Admitting: Hematology and Oncology

## 2021-06-26 ENCOUNTER — Inpatient Hospital Stay: Payer: No Typology Code available for payment source

## 2021-06-26 NOTE — Telephone Encounter (Signed)
Attempted to call regarding missed appts. Unable to leave a message.

## 2021-07-11 ENCOUNTER — Other Ambulatory Visit: Payer: Self-pay

## 2021-07-11 ENCOUNTER — Inpatient Hospital Stay: Payer: No Typology Code available for payment source | Attending: Internal Medicine

## 2021-07-11 ENCOUNTER — Other Ambulatory Visit: Payer: Self-pay | Admitting: Hematology and Oncology

## 2021-07-11 ENCOUNTER — Telehealth: Payer: Self-pay

## 2021-07-11 DIAGNOSIS — C9001 Multiple myeloma in remission: Secondary | ICD-10-CM

## 2021-07-11 DIAGNOSIS — Z91199 Patient's noncompliance with other medical treatment and regimen due to unspecified reason: Secondary | ICD-10-CM | POA: Insufficient documentation

## 2021-07-11 DIAGNOSIS — G8929 Other chronic pain: Secondary | ICD-10-CM | POA: Insufficient documentation

## 2021-07-11 DIAGNOSIS — M549 Dorsalgia, unspecified: Secondary | ICD-10-CM | POA: Insufficient documentation

## 2021-07-11 DIAGNOSIS — Z9484 Stem cells transplant status: Secondary | ICD-10-CM | POA: Insufficient documentation

## 2021-07-11 NOTE — Telephone Encounter (Signed)
Returned his call. He is back in town and wanting to reschedule appts. Scheduled lab appt for tomorrow and follow up with Dr. Alvy Bimler on 6/26. He is aware of appt date/times.  Lab orders entered.

## 2021-07-12 ENCOUNTER — Other Ambulatory Visit: Payer: Self-pay

## 2021-07-12 ENCOUNTER — Inpatient Hospital Stay: Payer: No Typology Code available for payment source

## 2021-07-12 DIAGNOSIS — M549 Dorsalgia, unspecified: Secondary | ICD-10-CM | POA: Diagnosis not present

## 2021-07-12 DIAGNOSIS — Z91199 Patient's noncompliance with other medical treatment and regimen due to unspecified reason: Secondary | ICD-10-CM | POA: Diagnosis not present

## 2021-07-12 DIAGNOSIS — C9001 Multiple myeloma in remission: Secondary | ICD-10-CM | POA: Diagnosis not present

## 2021-07-12 DIAGNOSIS — G8929 Other chronic pain: Secondary | ICD-10-CM | POA: Diagnosis not present

## 2021-07-12 DIAGNOSIS — Z9484 Stem cells transplant status: Secondary | ICD-10-CM | POA: Diagnosis not present

## 2021-07-12 LAB — CMP (CANCER CENTER ONLY)
ALT: 48 U/L — ABNORMAL HIGH (ref 0–44)
AST: 27 U/L (ref 15–41)
Albumin: 4.7 g/dL (ref 3.5–5.0)
Alkaline Phosphatase: 47 U/L (ref 38–126)
Anion gap: 5 (ref 5–15)
BUN: 13 mg/dL (ref 6–20)
CO2: 30 mmol/L (ref 22–32)
Calcium: 9.9 mg/dL (ref 8.9–10.3)
Chloride: 105 mmol/L (ref 98–111)
Creatinine: 1.24 mg/dL (ref 0.61–1.24)
GFR, Estimated: >60 mL/min (ref >60)
Glucose, Bld: 85 mg/dL (ref 70–99)
Potassium: 3.8 mmol/L (ref 3.5–5.1)
Sodium: 140 mmol/L (ref 135–145)
Total Bilirubin: 1 mg/dL (ref 0.3–1.2)
Total Protein: 7.6 g/dL (ref 6.5–8.1)

## 2021-07-12 LAB — CBC WITH DIFFERENTIAL (CANCER CENTER ONLY)
Abs Immature Granulocytes: 0.01 10*3/uL (ref 0.00–0.07)
Basophils Absolute: 0 10*3/uL (ref 0.0–0.1)
Basophils Relative: 1 %
Eosinophils Absolute: 0.1 10*3/uL (ref 0.0–0.5)
Eosinophils Relative: 3 %
HCT: 39.8 % (ref 39.0–52.0)
Hemoglobin: 13.7 g/dL (ref 13.0–17.0)
Immature Granulocytes: 0 %
Lymphocytes Relative: 54 %
Lymphs Abs: 2.5 10*3/uL (ref 0.7–4.0)
MCH: 30.4 pg (ref 26.0–34.0)
MCHC: 34.4 g/dL (ref 30.0–36.0)
MCV: 88.2 fL (ref 80.0–100.0)
Monocytes Absolute: 0.4 10*3/uL (ref 0.1–1.0)
Monocytes Relative: 8 %
Neutro Abs: 1.6 10*3/uL — ABNORMAL LOW (ref 1.7–7.7)
Neutrophils Relative %: 34 %
Platelet Count: 204 10*3/uL (ref 150–400)
RBC: 4.51 MIL/uL (ref 4.22–5.81)
RDW: 12.6 % (ref 11.5–15.5)
WBC Count: 4.7 10*3/uL (ref 4.0–10.5)
nRBC: 0 % (ref 0.0–0.2)

## 2021-07-12 LAB — VITAMIN D 25 HYDROXY (VIT D DEFICIENCY, FRACTURES): Vit D, 25-Hydroxy: 32.92 ng/mL (ref 30–100)

## 2021-07-13 LAB — KAPPA/LAMBDA LIGHT CHAINS
Kappa free light chain: 18.9 mg/L (ref 3.3–19.4)
Kappa, lambda light chain ratio: 0.14 — ABNORMAL LOW (ref 0.26–1.65)
Lambda free light chains: 138.4 mg/L — ABNORMAL HIGH (ref 5.7–26.3)

## 2021-07-17 LAB — MULTIPLE MYELOMA PANEL, SERUM
Albumin SerPl Elph-Mcnc: 4 g/dL (ref 2.9–4.4)
Albumin/Glob SerPl: 1.4 (ref 0.7–1.7)
Alpha 1: 0.2 g/dL (ref 0.0–0.4)
Alpha2 Glob SerPl Elph-Mcnc: 0.5 g/dL (ref 0.4–1.0)
B-Globulin SerPl Elph-Mcnc: 0.9 g/dL (ref 0.7–1.3)
Gamma Glob SerPl Elph-Mcnc: 1.3 g/dL (ref 0.4–1.8)
Globulin, Total: 3 g/dL (ref 2.2–3.9)
IgA: 110 mg/dL (ref 90–386)
IgG (Immunoglobin G), Serum: 1277 mg/dL (ref 603–1613)
IgM (Immunoglobulin M), Srm: 56 mg/dL (ref 20–172)
Total Protein ELP: 7 g/dL (ref 6.0–8.5)

## 2021-07-23 ENCOUNTER — Encounter: Payer: Self-pay | Admitting: Hematology and Oncology

## 2021-07-23 ENCOUNTER — Other Ambulatory Visit: Payer: Self-pay

## 2021-07-23 ENCOUNTER — Inpatient Hospital Stay (HOSPITAL_BASED_OUTPATIENT_CLINIC_OR_DEPARTMENT_OTHER): Payer: No Typology Code available for payment source | Admitting: Hematology and Oncology

## 2021-07-23 VITALS — BP 149/96 | HR 77 | Temp 98.1°F | Resp 18 | Ht 74.0 in | Wt 243.2 lb

## 2021-07-23 DIAGNOSIS — M549 Dorsalgia, unspecified: Secondary | ICD-10-CM | POA: Diagnosis not present

## 2021-07-23 DIAGNOSIS — C9001 Multiple myeloma in remission: Secondary | ICD-10-CM

## 2021-07-23 DIAGNOSIS — Z91199 Patient's noncompliance with other medical treatment and regimen due to unspecified reason: Secondary | ICD-10-CM | POA: Diagnosis not present

## 2021-07-23 DIAGNOSIS — G8929 Other chronic pain: Secondary | ICD-10-CM | POA: Diagnosis not present

## 2021-10-22 ENCOUNTER — Inpatient Hospital Stay: Payer: No Typology Code available for payment source | Attending: Internal Medicine

## 2021-10-22 ENCOUNTER — Other Ambulatory Visit: Payer: Self-pay

## 2021-10-22 DIAGNOSIS — C9001 Multiple myeloma in remission: Secondary | ICD-10-CM | POA: Diagnosis not present

## 2021-10-22 LAB — CBC WITH DIFFERENTIAL/PLATELET
Abs Immature Granulocytes: 0.01 10*3/uL (ref 0.00–0.07)
Basophils Absolute: 0.1 10*3/uL (ref 0.0–0.1)
Basophils Relative: 1 %
Eosinophils Absolute: 0.2 10*3/uL (ref 0.0–0.5)
Eosinophils Relative: 4 %
HCT: 38.1 % — ABNORMAL LOW (ref 39.0–52.0)
Hemoglobin: 13.7 g/dL (ref 13.0–17.0)
Immature Granulocytes: 0 %
Lymphocytes Relative: 45 %
Lymphs Abs: 2.3 10*3/uL (ref 0.7–4.0)
MCH: 31.6 pg (ref 26.0–34.0)
MCHC: 36 g/dL (ref 30.0–36.0)
MCV: 88 fL (ref 80.0–100.0)
Monocytes Absolute: 0.4 10*3/uL (ref 0.1–1.0)
Monocytes Relative: 8 %
Neutro Abs: 2.1 10*3/uL (ref 1.7–7.7)
Neutrophils Relative %: 42 %
Platelets: 173 10*3/uL (ref 150–400)
RBC: 4.33 MIL/uL (ref 4.22–5.81)
RDW: 12.3 % (ref 11.5–15.5)
WBC: 5.1 10*3/uL (ref 4.0–10.5)
nRBC: 0 % (ref 0.0–0.2)

## 2021-10-22 LAB — COMPREHENSIVE METABOLIC PANEL
ALT: 24 U/L (ref 0–44)
AST: 21 U/L (ref 15–41)
Albumin: 4.5 g/dL (ref 3.5–5.0)
Alkaline Phosphatase: 35 U/L — ABNORMAL LOW (ref 38–126)
Anion gap: 4 — ABNORMAL LOW (ref 5–15)
BUN: 20 mg/dL (ref 6–20)
CO2: 28 mmol/L (ref 22–32)
Calcium: 9.4 mg/dL (ref 8.9–10.3)
Chloride: 104 mmol/L (ref 98–111)
Creatinine, Ser: 1.1 mg/dL (ref 0.61–1.24)
GFR, Estimated: >60 mL/min (ref >60)
Glucose, Bld: 105 mg/dL — ABNORMAL HIGH (ref 70–99)
Potassium: 3.7 mmol/L (ref 3.5–5.1)
Sodium: 136 mmol/L (ref 135–145)
Total Bilirubin: 0.6 mg/dL (ref 0.3–1.2)
Total Protein: 7.2 g/dL (ref 6.5–8.1)

## 2021-10-23 LAB — KAPPA/LAMBDA LIGHT CHAINS
Kappa free light chain: 12.3 mg/L (ref 3.3–19.4)
Kappa, lambda light chain ratio: 0.05 — ABNORMAL LOW (ref 0.26–1.65)
Lambda free light chains: 247.9 mg/L — ABNORMAL HIGH (ref 5.7–26.3)

## 2021-10-26 LAB — MULTIPLE MYELOMA PANEL, SERUM
Albumin SerPl Elph-Mcnc: 4.1 g/dL (ref 2.9–4.4)
Albumin/Glob SerPl: 1.6 (ref 0.7–1.7)
Alpha 1: 0.1 g/dL (ref 0.0–0.4)
Alpha2 Glob SerPl Elph-Mcnc: 0.5 g/dL (ref 0.4–1.0)
B-Globulin SerPl Elph-Mcnc: 0.9 g/dL (ref 0.7–1.3)
Gamma Glob SerPl Elph-Mcnc: 1.1 g/dL (ref 0.4–1.8)
Globulin, Total: 2.6 g/dL (ref 2.2–3.9)
IgA: 88 mg/dL — ABNORMAL LOW (ref 90–386)
IgG (Immunoglobin G), Serum: 1085 mg/dL (ref 603–1613)
IgM (Immunoglobulin M), Srm: 38 mg/dL (ref 20–172)
Total Protein ELP: 6.7 g/dL (ref 6.0–8.5)

## 2021-10-29 ENCOUNTER — Ambulatory Visit: Payer: No Typology Code available for payment source | Admitting: Hematology and Oncology

## 2021-10-30 ENCOUNTER — Encounter: Payer: Self-pay | Admitting: Hematology and Oncology

## 2021-10-30 ENCOUNTER — Inpatient Hospital Stay: Payer: No Typology Code available for payment source | Attending: Internal Medicine | Admitting: Hematology and Oncology

## 2021-10-30 DIAGNOSIS — Z5112 Encounter for antineoplastic immunotherapy: Secondary | ICD-10-CM | POA: Insufficient documentation

## 2021-10-30 DIAGNOSIS — C9002 Multiple myeloma in relapse: Secondary | ICD-10-CM | POA: Insufficient documentation

## 2021-10-30 DIAGNOSIS — M545 Low back pain, unspecified: Secondary | ICD-10-CM | POA: Insufficient documentation

## 2021-10-30 DIAGNOSIS — Z79899 Other long term (current) drug therapy: Secondary | ICD-10-CM | POA: Insufficient documentation

## 2021-10-30 DIAGNOSIS — G8929 Other chronic pain: Secondary | ICD-10-CM | POA: Insufficient documentation

## 2021-10-31 ENCOUNTER — Telehealth: Payer: Self-pay

## 2021-10-31 NOTE — Telephone Encounter (Signed)
Pt called and LVM asking when his appt with Dr Alvy Bimler is. Pt states he originally thought it was scheduled 10/29/21 and was not sure what happened. Advised pt his appt was 10/30/21. He asks to be rescheduled. Advised pt I would make MD and scheduler aware of his request. Message sent to scheduling.

## 2021-11-01 NOTE — Telephone Encounter (Signed)
I sent LOS 

## 2021-11-02 ENCOUNTER — Telehealth: Payer: Self-pay

## 2021-11-02 DIAGNOSIS — C9001 Multiple myeloma in remission: Secondary | ICD-10-CM | POA: Diagnosis not present

## 2021-11-02 DIAGNOSIS — G629 Polyneuropathy, unspecified: Secondary | ICD-10-CM | POA: Diagnosis not present

## 2021-11-02 DIAGNOSIS — Z9484 Stem cells transplant status: Secondary | ICD-10-CM | POA: Diagnosis not present

## 2021-11-02 DIAGNOSIS — C9002 Multiple myeloma in relapse: Secondary | ICD-10-CM | POA: Diagnosis not present

## 2021-11-02 NOTE — Telephone Encounter (Signed)
Returned his call regarding next appt with Dr. Alvy Bimler. Given appt date on 10/12 at 0920. He verbalized understanding.

## 2021-11-07 DIAGNOSIS — Z6830 Body mass index (BMI) 30.0-30.9, adult: Secondary | ICD-10-CM | POA: Diagnosis not present

## 2021-11-07 DIAGNOSIS — I129 Hypertensive chronic kidney disease with stage 1 through stage 4 chronic kidney disease, or unspecified chronic kidney disease: Secondary | ICD-10-CM | POA: Diagnosis not present

## 2021-11-07 DIAGNOSIS — E669 Obesity, unspecified: Secondary | ICD-10-CM | POA: Diagnosis not present

## 2021-11-07 DIAGNOSIS — C9001 Multiple myeloma in remission: Secondary | ICD-10-CM | POA: Diagnosis not present

## 2021-11-07 DIAGNOSIS — Z008 Encounter for other general examination: Secondary | ICD-10-CM | POA: Diagnosis not present

## 2021-11-07 DIAGNOSIS — N182 Chronic kidney disease, stage 2 (mild): Secondary | ICD-10-CM | POA: Diagnosis not present

## 2021-11-07 DIAGNOSIS — G620 Drug-induced polyneuropathy: Secondary | ICD-10-CM | POA: Diagnosis not present

## 2021-11-08 ENCOUNTER — Encounter: Payer: Self-pay | Admitting: Hematology and Oncology

## 2021-11-08 ENCOUNTER — Inpatient Hospital Stay (HOSPITAL_BASED_OUTPATIENT_CLINIC_OR_DEPARTMENT_OTHER): Payer: No Typology Code available for payment source | Admitting: Hematology and Oncology

## 2021-11-08 ENCOUNTER — Other Ambulatory Visit: Payer: Self-pay | Admitting: *Deleted

## 2021-11-08 ENCOUNTER — Other Ambulatory Visit: Payer: Self-pay

## 2021-11-08 ENCOUNTER — Telehealth: Payer: Self-pay | Admitting: Pharmacy Technician

## 2021-11-08 ENCOUNTER — Telehealth: Payer: Self-pay

## 2021-11-08 ENCOUNTER — Other Ambulatory Visit (HOSPITAL_COMMUNITY): Payer: Self-pay

## 2021-11-08 VITALS — BP 143/92 | HR 81 | Resp 18 | Ht 74.0 in | Wt 243.4 lb

## 2021-11-08 DIAGNOSIS — C9002 Multiple myeloma in relapse: Secondary | ICD-10-CM | POA: Diagnosis not present

## 2021-11-08 DIAGNOSIS — M549 Dorsalgia, unspecified: Secondary | ICD-10-CM | POA: Diagnosis not present

## 2021-11-08 DIAGNOSIS — C9001 Multiple myeloma in remission: Secondary | ICD-10-CM

## 2021-11-08 DIAGNOSIS — M545 Low back pain, unspecified: Secondary | ICD-10-CM | POA: Diagnosis not present

## 2021-11-08 DIAGNOSIS — G8929 Other chronic pain: Secondary | ICD-10-CM

## 2021-11-08 DIAGNOSIS — Z5112 Encounter for antineoplastic immunotherapy: Secondary | ICD-10-CM | POA: Diagnosis not present

## 2021-11-08 DIAGNOSIS — Z79899 Other long term (current) drug therapy: Secondary | ICD-10-CM | POA: Diagnosis not present

## 2021-11-08 MED ORDER — POMALIDOMIDE 3 MG PO CAPS: 3.0000 mg | ORAL_CAPSULE | Freq: Every day | ORAL | 0 refills | Status: DC

## 2021-11-08 MED ORDER — ONDANSETRON HCL 8 MG PO TABS: 8.0000 mg | ORAL_TABLET | Freq: Three times a day (TID) | ORAL | 1 refills | Status: AC | PRN

## 2021-11-08 MED ORDER — PROCHLORPERAZINE MALEATE 10 MG PO TABS: 10.0000 mg | ORAL_TABLET | Freq: Four times a day (QID) | ORAL | 1 refills | Status: DC | PRN

## 2021-11-08 MED ORDER — DEXAMETHASONE 4 MG PO TABS: 20.0000 mg | ORAL_TABLET | Freq: Every day | ORAL | 11 refills | Status: DC

## 2021-11-08 MED ORDER — ACYCLOVIR 400 MG PO TABS: 400.0000 mg | ORAL_TABLET | Freq: Two times a day (BID) | ORAL | 11 refills | Status: DC

## 2021-11-08 MED ORDER — DEXAMETHASONE 4 MG PO TABS: 20.0000 mg | ORAL_TABLET | ORAL | 11 refills | Status: DC

## 2021-11-08 NOTE — Telephone Encounter (Signed)
Oral Oncology Patient Advocate Encounter   Received notification that prior authorization for Pomalyst is required.   PA submitted on 11/08/2021 Key [id number] Status is pending     Bradley Hunt, CPhT-Adv Oncology Pharmacy Patient [place] Direct Number: [phone]  Fax: [phone]

## 2021-11-08 NOTE — Assessment & Plan Note (Signed)
He has significant back pain recently I recommend PET/CT imaging for staging

## 2021-11-08 NOTE — Telephone Encounter (Signed)
Oral Oncology Pharmacist Encounter  Received new prescription for pomalidomide (Pomalyst) for the treatment of relapsed multiple myeloma in conjunction with daratumumab and prednisone, planned duration until disease progression or unacceptable toxicity.  Labs from 11/02/21 assessed, no interventions needed. Prescription dose and frequency assessed.  Current medication list in Epic reviewed, no DDIs with Pomalyst identified.  Evaluated chart and no patient barriers to medication adherence noted.   Patient agreement for treatment documented in MD note on 11/08/21.  Prescription has been e-scribed to the [facility] for benefits analysis and approval.  Oral Oncology Clinic will continue to follow for insurance authorization, copayment issues, initial counseling and start date.  Drema Halon, PharmD Hematology/Oncology Clinical Pharmacist [facility] [phone] 11/08/2021 10:54 AM

## 2021-11-08 NOTE — Progress Notes (Signed)
[place] OFFICE PROGRESS NOTE  Patient Care Team: [name], MD as PCP - General (Internal Medicine)  ASSESSMENT & PLAN:  Multiple myeloma in relapse [facility]) I reviewed his recent myeloma panel Unfortunately, he has signs of cancer recurrence I recommend PET/CT imaging for staging I recommend we start him back on treatment Previous if, he has complete response to treatment with combination of daratumumab, pomalidomide and dexamethasone I recommend the same treatment for him as it worked before I will start him on acyclovir for antimicrobial prophylaxis I reminded him to take aspirin therapy He also should take calcium and vitamin D I will see him before we start treatment We will discuss dental clearance in the future before we start him on Zometa  Chronic back pain greater than 3 months duration He has significant back pain recently I recommend PET/CT imaging for staging  Orders Placed This Encounter  Procedures   NM PET Image Initial (PI) Whole Body    Standing Status:   Future    Standing Expiration Date:   11/08/2022    Order Specific Question:   If indicated for the ordered procedure, I authorize the administration of a radiopharmaceutical per Radiology protocol    Answer:   Yes    Order Specific Question:   Preferred imaging location?    Answer:   [place] ([place] only)    Standing Status:   Future    Standing Expiration Date:   11/28/2022   CBC with Differential (Cancer Center Only)    Standing Status:   Future    Standing Expiration Date:   11/28/2022   CMP ([place] only)    Standing Status:   Future    Standing Expiration Date:   12/05/2022   CBC with Differential (Cancer Center Only)    Standing Status:   Future    Standing Expiration Date:   12/05/2022   CMP ([place] only)    Standing Status:   Future    Standing Expiration Date:   12/12/2022   CBC with Differential (Cancer Center Only)    Standing Status:    Future    Standing Expiration Date:   12/12/2022   CMP ([place] only)    Standing Status:   Future    Standing Expiration Date:   12/19/2022   CBC with Differential ([place] Only)    Standing Status:   Future    Standing Expiration Date:   12/19/2022   CMP ([place] only)    Standing Status:   Future    Standing Expiration Date:   12/26/2022   CBC with Differential ([place] Only)    Standing Status:   Future    Standing Expiration Date:   12/26/2022   CMP ([place] only)    Standing Status:   Future    Standing Expiration Date:   01/02/2023   CBC with Differential ([place] Only)    Standing Status:   Future    Standing Expiration Date:   01/02/2023   CMP ([place] only)    Standing Status:   Future    Standing Expiration Date:   01/09/2023   CBC with Differential ([place] Only)    Standing Status:   Future    Standing Expiration Date:   01/09/2023   CMP ([place] only)    Standing Status:   Future    Standing Expiration Date:   01/16/2023   CBC with Differential (Cancer  Center Only)    Standing Status:   Future    Standing Expiration Date:   01/16/2023   CMP ([place] only)    Standing Status:   Future    Standing Expiration Date:   01/30/2023   CBC with Differential (Cancer Center Only)    Standing Status:   Future    Standing Expiration Date:   01/30/2023   CMP ([place] only)    Standing Status:   Future    Standing Expiration Date:   02/13/2023   CBC with Differential (Cancer Center Only)    Standing Status:   Future    Standing Expiration Date:   02/13/2023   CMP ([place] only)    Standing Status:   Future    Standing Expiration Date:   02/27/2023   CBC with Differential (Cancer Center Only)    Standing Status:   Future    Standing Expiration Date:   02/27/2023   CMP ([place] only)    Standing Status:   Future    Standing Expiration Date:   03/13/2023   CBC with Differential (Cancer Center Only)     Standing Status:   Future    Standing Expiration Date:   03/13/2023   CMP ([place] only)    Standing Status:   Future    Standing Expiration Date:   03/27/2023   CBC with Differential (Cancer Center Only)    Standing Status:   Future    Standing Expiration Date:   03/27/2023   CMP ([place] only)    Standing Status:   Future    Standing Expiration Date:   04/10/2023   CBC with Differential (Cancer Center Only)    Standing Status:   Future    Standing Expiration Date:   04/10/2023   CMP ([place] only)    Standing Status:   Future    Standing Expiration Date:   04/24/2023   CBC with Differential (Cancer Center Only)    Standing Status:   Future    Standing Expiration Date:   04/24/2023   CMP ([place] only)    Standing Status:   Future    Standing Expiration Date:   05/08/2023   CBC with Differential (Cancer Center Only)    Standing Status:   Future    Standing Expiration Date:   05/08/2023   CMP ([place] only)    Standing Status:   Future    Standing Expiration Date:   05/22/2023   CBC with Differential (Cancer Center Only)    Standing Status:   Future    Standing Expiration Date:   05/22/2023   Multiple Myeloma Panel (SPEP&IFE w/QIG)    Standing Status:   Future    Standing Expiration Date:   11/28/2022   Kappa/lambda light chains    Standing Status:   Future    Standing Expiration Date:   11/28/2022   Multiple Myeloma Panel (SPEP&IFE w/QIG)    Standing Status:   Future    Standing Expiration Date:   12/26/2022   Kappa/lambda light chains    Standing Status:   Future    Standing Expiration Date:   12/26/2022   Multiple Myeloma Panel (SPEP&IFE w/QIG)    Standing Status:   Future    Standing Expiration Date:   01/30/2023   Kappa/lambda light chains    Standing Status:   Future    Standing Expiration Date:   01/30/2023   Multiple Myeloma Panel (SPEP&IFE w/QIG)  Standing Status:   Future    Standing Expiration Date:   02/27/2023   Kappa/lambda light  chains    Standing Status:   Future    Standing Expiration Date:   02/27/2023   Multiple Myeloma Panel (SPEP&IFE w/QIG)    Standing Status:   Future    Standing Expiration Date:   03/27/2023   Kappa/lambda light chains    Standing Status:   Future    Standing Expiration Date:   03/27/2023   Multiple Myeloma Panel (SPEP&IFE w/QIG)    Standing Status:   Future    Standing Expiration Date:   04/24/2023   Kappa/lambda light chains    Standing Status:   Future    Standing Expiration Date:   04/24/2023   Multiple Myeloma Panel (SPEP&IFE w/QIG)    Standing Status:   Future    Standing Expiration Date:   05/22/2023   Kappa/lambda light chains    Standing Status:   Future    Standing Expiration Date:   05/22/2023   VITAMIN D 25 Hydroxy (Vit-D Deficiency, Fractures)    Standing Status:   Future    Standing Expiration Date:   11/09/2022   Uric acid    Standing Status:   Future    Standing Expiration Date:   11/09/2022   PHYSICIAN COMMUNICATION ORDER    Order aspirin 81-335m OR Coumadin for thromboembolic prophylaxis via "add orders". Target Coumadin INR = 2-3.    All questions were answered. The patient knows to call the clinic with any problems, questions or concerns. The total time spent in the appointment was 40 minutes encounter with patients including review of chart and various tests results, discussions about plan of care and coordination of care plan   NHeath Lark MD 11/08/2021 10:43 AM  INTERVAL HISTORY: Please see below for problem oriented charting. he returns for follow-up He missed his appointment recently He has intermittent back pain He has noted slight worsening neuropathy recently  REVIEW OF SYSTEMS:   Constitutional: Denies fevers, chills or abnormal weight loss Eyes: Denies blurriness of vision Ears, nose, mouth, throat, and face: Denies mucositis or sore throat Respiratory: Denies cough, dyspnea or wheezes Cardiovascular: Denies palpitation, chest discomfort or  lower extremity swelling Gastrointestinal:  Denies nausea, heartburn or change in bowel habits Skin: Denies abnormal skin rashes Lymphatics: Denies new lymphadenopathy or easy bruising Neurological:Denies numbness, tingling or new weaknesses Behavioral/Psych: Mood is stable, no new changes  All other systems were reviewed with the patient and are negative.  I have reviewed the past medical history, past surgical history, social history and family history with the patient and they are unchanged from previous note.  ALLERGIES:  has No Known Allergies.  MEDICATIONS:  Current Outpatient Medications  Medication Sig Dispense Refill   acyclovir (ZOVIRAX) 400 MG tablet Take 1 tablet (400 mg total) by mouth 2 (two) times daily. 60 tablet 11   calcium carbonate (TUMS - DOSED IN MG ELEMENTAL CALCIUM) 500 MG chewable tablet Chew 1 tablet by mouth 3 (three) times daily.     cholecalciferol (VITAMIN D3) 25 MCG (1000 UT) tablet Take 1,000 Units by mouth daily.     dexamethasone (DECADRON) 4 MG tablet Take 5 tablets (20 mg total) by mouth once a week. Take weekly on Tuesday mornings with food 20 tablet 11   ondansetron (ZOFRAN) 8 MG tablet Take 1 tablet (8 mg total) by mouth every 8 (eight) hours as needed for nausea or vomiting. 30 tablet 1   pomalidomide (POMALYST) 3  MG capsule Take 1 capsule (3 mg total) by mouth daily. Take for 21 days on, 7 days off, repeat every 28 days 21 capsule 0   prochlorperazine (COMPAZINE) 10 MG tablet Take 1 tablet (10 mg total) by mouth every 6 (six) hours as needed for nausea or vomiting. 30 tablet 1   No current facility-administered medications for this visit.    SUMMARY OF ONCOLOGIC HISTORY: Oncology History  Multiple myeloma in relapse [facility])  06/23/2015 - 06/28/2015 Hospital Admission   The patient was admitted to the hospital due to gait ataxia and back pain. He was subsequently found to have cord compression underwent surgery and was discharged home   06/24/2015  Imaging   Abnormal appearance of the T6 vertebral body, highly suspicious for possible osseous metastasis. Associated pathologic fracture withup to 30% height loss. There is associated abnormal soft tissue density within the ventral epidural space,   06/24/2015 Imaging   MRI lumbar: Focal osseous lesion with abnormal enhancement involving the right pedicle of L3, suspicious for possible osseous metastasisgiven the findings in the thoracic spine. Question additional focal lesion within the right iliac wing as above.     06/25/2015 Pathology Results   Accession: [id number] bone biopsy come from plasma cell neoplasm.   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 Zometa is not given due to inability to get dental clearance   12/14/2015 - 04/24/2016 Chemotherapy   He is started on maintenance treatment with Revlimid only   12/18/2015 Imaging   MRI thoracic and lumbar spine showed numerous enhancing foci throughout the thoracic and lumbar spine with several new small foci in the lumbar spine in comparison with prior MRI compatible with metastatic disease. Stable loss of height of the T3, T4, and T6 vertebral bodies and new postsurgical changes related to T6 laminectomy. No significant epidural disease or evidence for cord compression. No abnormal enhancement of the spinal cord or cauda equina.   05/02/2016 Bone Marrow Biopsy   Outside bone marrow biopsy showed 20% myeloma involvement   05/16/2016 Procedure   Successful placement of a right internal jugular approach power injectable Port-A-Cath. The catheter is ready for immediate use.   05/21/2016 - 07/03/2016 Chemotherapy   He received Kyprolis, Cytoxan and  dexamethasone    07/08/2016 Procedure   Status post CT-guided bone marrow biopsy, with tissue specimen sent to pathology for complete histopathologic analysis   07/08/2016 Bone Marrow Biopsy   Bone Marrow, Aspirate,Biopsy, and Clot BONE MARROW: - MILDLY HYPERCELLULAR MARROW (60%) WITH PLASMA CELL NEOPLASM - SEE COMMENT PERIPHERAL BLOOD: - NORMOCYTIC ANEMIA Diagnosis Note The marrow is hypercellular with lambda-restricted plasma cells consistent with persistence of the patient's previously diagnosed plasma cell neoplasm. The plasma cells comprise approximately 10-15% of the total marrow cellularity, are enlarged, and arranged in clusters.   08/14/2016 Miscellaneous   He received conditioning treatment with melphalan   08/15/2016 Bone Marrow Transplant   He received autologous stem cell transplant   08/24/2016 - 08/29/2016 Hospital Admission   His post-transplant course was complicated by E-Coli bacteremia   11/26/2016 PET scan   PET CT at [facility] 1. Technically limited study due to soft tissue uptake. 2. New hypermetabolic uptake at C7 spinous process and left proximal femur that is  of questionable significance in absence of underlying CT correlate. Further assessment with whole body bone scan may be considered. 2. Redemonstrated nonhypermetabolic multifocal lucent and sclerotic lesions throughout the spine which are similar to prior.    12/02/2016 Bone Marrow Biopsy   He had repeat bone marrow biopsy at [facility] An immunohistochemical stain for CD138 is performed on the bone marrow core biopsy demonstrates increased plasma cells with focal clustering (10-20% overall), which are monotypic for lambda light chain by in situ hybridization.   01/06/2017 - 06/09/2017 Chemotherapy   He received weekly Dexamethasone, Pomalyst days 1-21 and Daratumumab. From 06/08/17 onwards, he is placed on maintenance Pomalyst only.  He self discontinue Pomalyst in February 2021   07/23/2017 Procedure   Successful  right IJ vein Port-A-Cath explant.   03/22/2021 Imaging   1. New large disc protrusion at L4-5 with severe spinal stenosis. 2. History of multiple myeloma with largely resolved diffuse bone marrow heterogeneity since 2017. New enhancing lesions in the L4 and L5 vertebral bodies without acute fracture. 3. Chronic thoracic compression fractures including severe T6 vertebral body height loss which has progressed from 2017. Resolved epidural tumor.     11/27/2021 -  Chemotherapy   Patient is on Treatment Plan : MYELOMA Daratumumab IV + Pomalidomide + Dexamethasone q28d x 7 cycles       PHYSICAL EXAMINATION: ECOG PERFORMANCE STATUS: 1 - Symptomatic but completely ambulatory  Vitals:   11/08/21 0924  BP: (!) 143/92  Pulse: 81  Resp: 18  SpO2: 100%   Filed Weights   11/08/21 0924  Weight: 243 lb 6.4 oz (110.4 kg)    GENERAL:alert, no distress and comfortable NEURO: alert & oriented x 3 with fluent speech, no focal motor/sensory deficits  LABORATORY DATA:  I have reviewed the data as listed    Component Value Date/Time   NA 136 10/22/2021 1008   NA 139 01/27/2017 0809   K 3.7 10/22/2021 1008   K 4.1 01/27/2017 0809   CL 104 10/22/2021 1008   CO2 28 10/22/2021 1008   CO2 23 01/27/2017 0809   GLUCOSE 105 (H) 10/22/2021 1008   GLUCOSE 122 01/27/2017 0809   BUN 20 10/22/2021 1008   BUN 14.3 01/27/2017 0809   CREATININE 1.10 10/22/2021 1008   CREATININE 1.24 07/12/2021 1025   CREATININE 0.9 01/27/2017 0809   CALCIUM 9.4 10/22/2021 1008   CALCIUM 9.1 01/27/2017 0809   PROT 7.2 10/22/2021 1008   PROT 6.2 (L) 01/27/2017 0809   ALBUMIN 4.5 10/22/2021 1008   ALBUMIN 3.7 01/27/2017 0809   AST 21 10/22/2021 1008   AST 27 07/12/2021 1025   AST 10 01/27/2017 0809   ALT 24 10/22/2021 1008   ALT 48 (H) 07/12/2021 1025   ALT 18 01/27/2017 0809   ALKPHOS 35 (L) 10/22/2021 1008   ALKPHOS 42 01/27/2017 0809   BILITOT 0.6 10/22/2021 1008   BILITOT 1.0 07/12/2021 1025   BILITOT 0.72  01/27/2017 0809   GFRNONAA >60 10/22/2021 1008   GFRNONAA >60 07/12/2021 1025   GFRAA >60 10/18/2019 1017    No results found for: "SPEP", "UPEP"  Lab Results  Component Value Date   WBC 5.1 10/22/2021   NEUTROABS 2.1 10/22/2021   HGB 13.7 10/22/2021   HCT 38.1 (L) 10/22/2021   MCV 88.0 10/22/2021   PLT 173 10/22/2021      Chemistry      Component Value Date/Time   NA 136 10/22/2021 1008   NA 139 01/27/2017 0809   K 3.7  10/22/2021 1008   K 4.1 01/27/2017 0809   CL 104 10/22/2021 1008   CO2 28 10/22/2021 1008   CO2 23 01/27/2017 0809   BUN 20 10/22/2021 1008   BUN 14.3 01/27/2017 0809   CREATININE 1.10 10/22/2021 1008   CREATININE 1.24 07/12/2021 1025   CREATININE 0.9 01/27/2017 0809      Component Value Date/Time   CALCIUM 9.4 10/22/2021 1008   CALCIUM 9.1 01/27/2017 0809   ALKPHOS 35 (L) 10/22/2021 1008   ALKPHOS 42 01/27/2017 0809   AST 21 10/22/2021 1008   AST 27 07/12/2021 1025   AST 10 01/27/2017 0809   ALT 24 10/22/2021 1008   ALT 48 (H) 07/12/2021 1025   ALT 18 01/27/2017 0809   BILITOT 0.6 10/22/2021 1008   BILITOT 1.0 07/12/2021 1025   BILITOT 0.72 01/27/2017 0809

## 2021-11-08 NOTE — Assessment & Plan Note (Signed)
I reviewed his recent myeloma panel Unfortunately, he has signs of cancer recurrence I recommend PET/CT imaging for staging I recommend we start him back on treatment Previous if, he has complete response to treatment with combination of daratumumab, pomalidomide and dexamethasone I recommend the same treatment for him as it worked before I will start him on acyclovir for antimicrobial prophylaxis I reminded him to take aspirin therapy He also should take calcium and vitamin D I will see him before we start treatment We will discuss dental clearance in the future before we start him on Zometa

## 2021-11-08 NOTE — Telephone Encounter (Signed)
Oral Oncology Patient Advocate Encounter  Prior Authorization for Pomalyst has been approved.    PA# [id number] Effective dates: 08/10/21 through 11/07/24  Patients co-pay is $9.66.    Lady Deutscher, CPhT-Adv Oncology Pharmacy Patient [place] [facility] [place] Direct Number: [phone]  Fax: [phone]

## 2021-11-09 ENCOUNTER — Other Ambulatory Visit: Payer: Self-pay

## 2021-11-15 NOTE — Telephone Encounter (Signed)
Oral Oncology Pharmacist Encounter  Attempted to call patient to inform patient to hold on to [facility] and to not take the medication yet that was delivered from [employer]. Mailbox was full and was unable to leave voicemail. I sent a mychart message although this has not been read as of yet.  Drema Halon, PharmD Hematology/Oncology Clinical Pharmacist [facility] [phone]

## 2021-11-18 ENCOUNTER — Other Ambulatory Visit: Payer: Self-pay

## 2021-11-21 ENCOUNTER — Telehealth: Payer: Self-pay

## 2021-11-21 NOTE — Telephone Encounter (Signed)
Attempted to notify patient that his PET sccan scheduled for 10/26 has been authorized. Unable to leave message as voicemail box is full. Will attempt to call back at a later time.

## 2021-11-22 ENCOUNTER — Ambulatory Visit (HOSPITAL_COMMUNITY)
Admission: RE | Admit: 2021-11-22 | Discharge: 2021-11-22 | Disposition: A | Discharge status: Home / Self Care | Payer: No Typology Code available for payment source | Source: Ambulatory Visit | Attending: Hematology and Oncology | Admitting: Hematology and Oncology

## 2021-11-22 DIAGNOSIS — C9001 Multiple myeloma in remission: Secondary | ICD-10-CM | POA: Diagnosis not present

## 2021-11-22 DIAGNOSIS — C9000 Multiple myeloma not having achieved remission: Secondary | ICD-10-CM | POA: Diagnosis not present

## 2021-11-22 LAB — GLUCOSE, CAPILLARY: Glucose-Capillary: 104 mg/dL — ABNORMAL HIGH (ref 70–99)

## 2021-11-22 MED ORDER — FLUDEOXYGLUCOSE F - 18 (FDG) INJECTION
12.1000 | Freq: Once | INTRAVENOUS | Status: AC | PRN
  Administered 2021-11-22: 12.1 via INTRAVENOUS

## 2021-11-26 NOTE — Telephone Encounter (Signed)
Oral Chemotherapy Pharmacist Encounter  I spoke with patient for overview of: Pomalyst (pomalidomide) for the treatment of relapsed multiple myeloma in conjunction with daratumumab and dexamethasone, planned duration until disease progression or unacceptable toxicity.   Counseled patient on administration, dosing, side effects, monitoring, drug-food interactions, safe handling, storage, and disposal.  Patient will take Pomalyst 32m capsules, 1 capsule by mouth once daily, without regard to food, with a full glass of water.  Pomalyst will be given 21 days on, 7 days off, repeat every 28 days.  Patient will take dexamethasone 443mtablets, 5 tablets (2056mby mouth once weekly with breakfast.  Daratumumab will be infused at 16 mg/kg once weekly x 8 doses, then once every 2 weeks x 8 doses, then once every 4 weeks until discontinuation.  Pomalyst and daratumumab start date: 11/27/2021  Adverse effects of Pomalyst include but are not limited to: nausea, constipation, diarrhea, abdominal pain, rash, fatigue, drug fever, peripheral edema, and decreased blood counts.    Reviewed with patient importance of keeping a medication schedule and plan for any missed doses. No barriers to medication adherence identified.  Medication reconciliation performed and medication/allergy list updated.   Patient has picked up acyclovir and dexamethasone prescriptions and will wait until cycle 1 day 1 prior to starting dexamethasone. Patient counseled on importance of daily aspirin 79m37mr VTE prophylaxis.  Insurance authorization for [employer] been obtained.  Pomalyst prescription [facility] is a limited distribution medication.  All questions answered.  Mr. HamaVanblarcomced understanding and appreciation.   Medication education handout placed in mail for patient. Patient knows to call the office with questions or concerns. Oral Chemotherapy Clinic phone number  provided to patient.   KaitDrema HalonarmD Hematology/Oncology Clinical Pharmacist [facility]-[phone]   3:04 PM

## 2021-11-27 ENCOUNTER — Inpatient Hospital Stay: Payer: No Typology Code available for payment source

## 2021-11-27 ENCOUNTER — Other Ambulatory Visit: Payer: Self-pay

## 2021-11-27 ENCOUNTER — Inpatient Hospital Stay (HOSPITAL_BASED_OUTPATIENT_CLINIC_OR_DEPARTMENT_OTHER): Payer: No Typology Code available for payment source | Admitting: Physician Assistant

## 2021-11-27 ENCOUNTER — Other Ambulatory Visit: Payer: Self-pay | Admitting: Hematology and Oncology

## 2021-11-27 ENCOUNTER — Inpatient Hospital Stay (HOSPITAL_BASED_OUTPATIENT_CLINIC_OR_DEPARTMENT_OTHER): Payer: No Typology Code available for payment source | Admitting: Hematology and Oncology

## 2021-11-27 ENCOUNTER — Encounter: Payer: Self-pay | Admitting: Hematology and Oncology

## 2021-11-27 VITALS — BP 158/100 | HR 81 | Temp 97.8°F | Resp 18 | Ht 74.0 in | Wt 248.6 lb

## 2021-11-27 VITALS — BP 156/98 | HR 79 | Temp 98.0°F | Resp 17

## 2021-11-27 DIAGNOSIS — G8929 Other chronic pain: Secondary | ICD-10-CM

## 2021-11-27 DIAGNOSIS — C9002 Multiple myeloma in relapse: Secondary | ICD-10-CM

## 2021-11-27 DIAGNOSIS — M549 Dorsalgia, unspecified: Secondary | ICD-10-CM | POA: Diagnosis not present

## 2021-11-27 DIAGNOSIS — Z5112 Encounter for antineoplastic immunotherapy: Secondary | ICD-10-CM | POA: Diagnosis not present

## 2021-11-27 LAB — CBC WITH DIFFERENTIAL (CANCER CENTER ONLY)
Abs Immature Granulocytes: 0.02 10*3/uL (ref 0.00–0.07)
Basophils Absolute: 0 10*3/uL (ref 0.0–0.1)
Basophils Relative: 1 %
Eosinophils Absolute: 0.2 10*3/uL (ref 0.0–0.5)
Eosinophils Relative: 3 %
HCT: 38.6 % — ABNORMAL LOW (ref 39.0–52.0)
Hemoglobin: 13.5 g/dL (ref 13.0–17.0)
Immature Granulocytes: 0 %
Lymphocytes Relative: 45 %
Lymphs Abs: 2.9 10*3/uL (ref 0.7–4.0)
MCH: 31.1 pg (ref 26.0–34.0)
MCHC: 35 g/dL (ref 30.0–36.0)
MCV: 88.9 fL (ref 80.0–100.0)
Monocytes Absolute: 0.6 10*3/uL (ref 0.1–1.0)
Monocytes Relative: 9 %
Neutro Abs: 2.7 10*3/uL (ref 1.7–7.7)
Neutrophils Relative %: 42 %
Platelet Count: 163 10*3/uL (ref 150–400)
RBC: 4.34 MIL/uL (ref 4.22–5.81)
RDW: 12 % (ref 11.5–15.5)
WBC Count: 6.4 10*3/uL (ref 4.0–10.5)
nRBC: 0 % (ref 0.0–0.2)

## 2021-11-27 LAB — CMP (CANCER CENTER ONLY)
ALT: 26 U/L (ref 0–44)
AST: 20 U/L (ref 15–41)
Albumin: 4.2 g/dL (ref 3.5–5.0)
Alkaline Phosphatase: 39 U/L (ref 38–126)
Anion gap: 6 (ref 5–15)
BUN: 26 mg/dL — ABNORMAL HIGH (ref 6–20)
CO2: 25 mmol/L (ref 22–32)
Calcium: 9.5 mg/dL (ref 8.9–10.3)
Chloride: 105 mmol/L (ref 98–111)
Creatinine: 1.31 mg/dL — ABNORMAL HIGH (ref 0.61–1.24)
GFR, Estimated: >60 mL/min (ref >60)
Glucose, Bld: 97 mg/dL (ref 70–99)
Potassium: 3.7 mmol/L (ref 3.5–5.1)
Sodium: 136 mmol/L (ref 135–145)
Total Bilirubin: 0.5 mg/dL (ref 0.3–1.2)
Total Protein: 6.8 g/dL (ref 6.5–8.1)

## 2021-11-27 MED ORDER — CETIRIZINE HCL 10 MG/ML IV SOLN
10.0000 mg | Freq: Once | INTRAVENOUS | Status: AC
  Administered 2021-11-27: 10 mg via INTRAVENOUS
  Filled 2021-11-27: qty 1

## 2021-11-27 MED ORDER — METHYLPREDNISOLONE SODIUM SUCC 125 MG IJ SOLR
125.0000 mg | Freq: Once | INTRAMUSCULAR | Status: AC | PRN
  Administered 2021-11-27: 125 mg via INTRAVENOUS

## 2021-11-27 MED ORDER — SODIUM CHLORIDE 0.9 % IV SOLN: Freq: Once | INTRAVENOUS | Status: AC

## 2021-11-27 MED ORDER — MONTELUKAST SODIUM 10 MG PO TABS
10.0000 mg | ORAL_TABLET | Freq: Once | ORAL | Status: AC
  Administered 2021-11-27: 10 mg via ORAL
  Filled 2021-11-27: qty 1

## 2021-11-27 MED ORDER — SODIUM CHLORIDE 0.9 % IV SOLN
16.0000 mg/kg | Freq: Once | INTRAVENOUS | Status: AC
  Administered 2021-11-27: 1800 mg via INTRAVENOUS
  Filled 2021-11-27: qty 80

## 2021-11-27 MED ORDER — FAMOTIDINE IN NACL 20-0.9 MG/50ML-% IV SOLN
20.0000 mg | Freq: Once | INTRAVENOUS | Status: AC | PRN
  Administered 2021-11-27: 20 mg via INTRAVENOUS

## 2021-11-27 MED ORDER — DIPHENHYDRAMINE HCL 50 MG/ML IJ SOLN
50.0000 mg | Freq: Once | INTRAMUSCULAR | Status: AC | PRN
  Administered 2021-11-27: 50 mg via INTRAVENOUS

## 2021-11-27 MED ORDER — SODIUM CHLORIDE 0.9 % IV SOLN: Freq: Once | INTRAVENOUS | Status: DC | PRN

## 2021-11-27 MED ORDER — ACETAMINOPHEN 325 MG PO TABS
650.0000 mg | ORAL_TABLET | Freq: Once | ORAL | Status: AC
  Administered 2021-11-27: 650 mg via ORAL
  Filled 2021-11-27: qty 2

## 2021-11-27 MED ORDER — DIPHENHYDRAMINE HCL 25 MG PO CAPS
25.0000 mg | ORAL_CAPSULE | Freq: Once | ORAL | Status: AC
  Administered 2021-11-27: 25 mg via ORAL
  Filled 2021-11-27: qty 1

## 2021-11-27 MED ORDER — SODIUM CHLORIDE 0.9 % IV SOLN
20.0000 mg | Freq: Once | INTRAVENOUS | Status: AC
  Administered 2021-11-27: 20 mg via INTRAVENOUS
  Filled 2021-11-27: qty 20

## 2021-11-27 NOTE — Progress Notes (Signed)
PEr Dr Alvy Bimler, no need for updated abo/rh lab

## 2021-11-27 NOTE — Progress Notes (Signed)
[place] OFFICE PROGRESS NOTE  Patient Care Team: [name], MD as PCP - General (Internal Medicine)  ASSESSMENT & PLAN:  Multiple myeloma in relapse [facility]) We discussed risk and benefits of treatment It took a while for insurance to approve his treatment After today's dose, I plan to switch him to subcutaneous daratumumab I reminded him to take aspirin for DVT prophylaxis while on Pomalyst He is also reminded to take acyclovir for antimicrobial prophylaxis I will schedule Zometa in the future after dental clearance  Chronic back pain greater than 3 months duration Reviewed PET CT imaging with the patient He has activity noted on L5 but he is not symptomatic Observe closely I am hopeful the chemotherapy will resolve the disease  No orders of the defined types were placed in this encounter.   All questions were answered. The patient knows to call the clinic with any problems, questions or concerns. The total time spent in the appointment was 30 minutes encounter with patients including review of chart and various tests results, discussions about plan of care and coordination of care plan   [name], MD 11/27/2021 10:31 AM  INTERVAL HISTORY: Please see below for problem oriented charting. he returns for treatment follow-up and review of test result and the start date of treatment He brought all his pills with him He did not start dexamethasone yet He denies back pain  REVIEW OF SYSTEMS:   Constitutional: Denies fevers, chills or abnormal weight loss Eyes: Denies blurriness of vision Ears, nose, mouth, throat, and face: Denies mucositis or sore throat Respiratory: Denies cough, dyspnea or wheezes Cardiovascular: Denies palpitation, chest discomfort or lower extremity swelling Gastrointestinal:  Denies nausea, heartburn or change in bowel habits Skin: Denies abnormal skin rashes Lymphatics: Denies new lymphadenopathy or easy  bruising Neurological:Denies numbness, tingling or new weaknesses Behavioral/Psych: Mood is stable, no new changes  All other systems were reviewed with the patient and are negative.  I have reviewed the past medical history, past surgical history, social history and family history with the patient and they are unchanged from previous note.  ALLERGIES:  has No Known Allergies.  MEDICATIONS:  Current Outpatient Medications  Medication Sig Dispense Refill   acyclovir (ZOVIRAX) 400 MG tablet Take 1 tablet (400 mg total) by mouth 2 (two) times daily. 60 tablet 11   aspirin EC 81 MG tablet Take 81 mg by mouth daily. Swallow whole.     calcium carbonate (TUMS - DOSED IN MG ELEMENTAL CALCIUM) 500 MG chewable tablet Chew 1 tablet by mouth 3 (three) times daily.     cholecalciferol (VITAMIN D3) 25 MCG (1000 UT) tablet Take 1,000 Units by mouth daily.     dexamethasone (DECADRON) 4 MG tablet Take 5 tablets (20 mg total) by mouth once a week. Take weekly on Tuesday mornings with food 20 tablet 11   ondansetron (ZOFRAN) 8 MG tablet Take 1 tablet (8 mg total) by mouth every 8 (eight) hours as needed for nausea or vomiting. 30 tablet 1   pomalidomide (POMALYST) 3 MG capsule Take 1 capsule (3 mg total) by mouth daily. Take for 21 days on, 7 days off, repeat every 28 days 21 capsule 0   prochlorperazine (COMPAZINE) 10 MG tablet Take 1 tablet (10 mg total) by mouth every 6 (six) hours as needed for nausea or vomiting. 30 tablet 1   No current facility-administered medications for this visit.   Facility-Administered Medications Ordered in Other Visits  Medication Dose Route Frequency Provider  Last Rate Last Admin   daratumumab (DARZALEX) 1,800 mg in sodium chloride 0.9 % 910 mL (1.8 mg/mL) chemo infusion  16 mg/kg (Treatment Plan Recorded) Intravenous Once [name], MD        SUMMARY OF ONCOLOGIC HISTORY: Oncology History  Multiple myeloma in relapse ([place])  06/23/2015 - 06/28/2015 Hospital Admission    The patient was admitted to the hospital due to gait ataxia and back pain. He was subsequently found to have cord compression underwent surgery and was discharged home   06/24/2015 Imaging   Abnormal appearance of the T6 vertebral body, highly suspicious for possible osseous metastasis. Associated pathologic fracture withup to 30% height loss. There is associated abnormal soft tissue density within the ventral epidural space,   06/24/2015 Imaging   MRI lumbar: Focal osseous lesion with abnormal enhancement involving the right pedicle of L3, suspicious for possible osseous metastasisgiven the findings in the thoracic spine. Question additional focal lesion within the right iliac wing as above.     06/25/2015 Pathology Results   Accession: [id number] bone biopsy come from plasma cell neoplasm.   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 Zometa is not given due to inability to get dental clearance   12/14/2015 - 04/24/2016 Chemotherapy   He is started on maintenance treatment with Revlimid only   12/18/2015 Imaging   MRI thoracic and lumbar spine showed numerous enhancing foci throughout the thoracic and lumbar spine with several new small foci in the lumbar spine in comparison with prior MRI compatible with metastatic disease. Stable loss of height of the T3, T4, and T6 vertebral bodies and new postsurgical changes related to T6 laminectomy. No significant epidural disease or evidence for cord compression. No abnormal enhancement of the spinal cord or cauda equina.   05/02/2016 Bone Marrow Biopsy   Outside bone marrow biopsy showed 20% myeloma involvement   05/16/2016 Procedure   Successful placement of a right  internal jugular approach power injectable Port-A-Cath. The catheter is ready for immediate use.   05/21/2016 - 07/03/2016 Chemotherapy   He received Kyprolis, Cytoxan and dexamethasone    07/08/2016 Procedure   Status post CT-guided bone marrow biopsy, with tissue specimen sent to pathology for complete histopathologic analysis   07/08/2016 Bone Marrow Biopsy   Bone Marrow, Aspirate,Biopsy, and Clot BONE MARROW: - MILDLY HYPERCELLULAR MARROW (60%) WITH PLASMA CELL NEOPLASM - SEE COMMENT PERIPHERAL BLOOD: - NORMOCYTIC ANEMIA Diagnosis Note The marrow is hypercellular with lambda-restricted plasma cells consistent with persistence of the patient's previously diagnosed plasma cell neoplasm. The plasma cells comprise approximately 10-15% of the total marrow cellularity, are enlarged, and arranged in clusters.   08/14/2016 Miscellaneous   He received conditioning treatment with melphalan   08/15/2016 Bone Marrow Transplant   He received autologous stem cell transplant   08/24/2016 - 08/29/2016 Hospital Admission   His post-transplant course was complicated by E-Coli bacteremia   11/26/2016 PET scan   PET CT at [facility] 1. Technically limited study due to soft tissue uptake. 2. New hypermetabolic uptake at C7 spinous process and left proximal femur that is of questionable significance in absence of underlying CT correlate. Further assessment with whole body bone scan may be considered. 2. Redemonstrated nonhypermetabolic multifocal lucent and sclerotic lesions  throughout the spine which are similar to prior.    12/02/2016 Bone Marrow Biopsy   He had repeat bone marrow biopsy at [facility] An immunohistochemical stain for CD138 is performed on the bone marrow core biopsy demonstrates increased plasma cells with focal clustering (10-20% overall), which are monotypic for lambda light chain by in situ hybridization.   01/06/2017 - 06/09/2017 Chemotherapy   He received weekly Dexamethasone, Pomalyst days  1-21 and Daratumumab. From 06/08/17 onwards, he is placed on maintenance Pomalyst only.  He self discontinue Pomalyst in February 2021   07/23/2017 Procedure   Successful right IJ vein Port-A-Cath explant.   03/22/2021 Imaging   1. New large disc protrusion at L4-5 with severe spinal stenosis. 2. History of multiple myeloma with largely resolved diffuse bone marrow heterogeneity since 2017. New enhancing lesions in the L4 and L5 vertebral bodies without acute fracture. 3. Chronic thoracic compression fractures including severe T6 vertebral body height loss which has progressed from 2017. Resolved epidural tumor.     11/26/2021 PET scan   1. Hypermetabolic patchy lytic L5 vertebral lesion compatible with metabolically active multiple myeloma. 2. No additional sites of hypermetabolic skeletal or extraskeletal myeloma. 3. Mild colonic diverticulosis.   11/27/2021 -  Chemotherapy   Patient is on Treatment Plan : MYELOMA Daratumumab IV + Pomalidomide + Dexamethasone q28d x 7 cycles       PHYSICAL EXAMINATION: ECOG PERFORMANCE STATUS: 1 - Symptomatic but completely ambulatory  Vitals:   11/27/21 0842  BP: (!) 158/100  Pulse: 81  Resp: 18  Temp: 97.8 F (36.6 C)  SpO2: 100%   Filed Weights   11/27/21 0842  Weight: 248 lb 9.6 oz (112.8 kg)    GENERAL:alert, no distress and comfortable  NEURO: alert & oriented x 3 with fluent speech, no focal motor/sensory deficits  LABORATORY DATA:  I have reviewed the data as listed    Component Value Date/Time   NA 136 11/27/2021 0820   NA 139 01/27/2017 0809   K 3.7 11/27/2021 0820   K 4.1 01/27/2017 0809   CL 105 11/27/2021 0820   CO2 25 11/27/2021 0820   CO2 23 01/27/2017 0809   GLUCOSE 97 11/27/2021 0820   GLUCOSE 122 01/27/2017 0809   BUN 26 (H) 11/27/2021 0820   BUN 14.3 01/27/2017 0809   CREATININE 1.31 (H) 11/27/2021 0820   CREATININE 0.9 01/27/2017 0809   CALCIUM 9.5 11/27/2021 0820   CALCIUM 9.1 01/27/2017 0809   PROT 6.8  11/27/2021 0820   PROT 6.2 (L) 01/27/2017 0809   ALBUMIN 4.2 11/27/2021 0820   ALBUMIN 3.7 01/27/2017 0809   AST 20 11/27/2021 0820   AST 10 01/27/2017 0809   ALT 26 11/27/2021 0820   ALT 18 01/27/2017 0809   ALKPHOS 39 11/27/2021 0820   ALKPHOS 42 01/27/2017 0809   BILITOT 0.5 11/27/2021 0820   BILITOT 0.72 01/27/2017 0809   GFRNONAA >60 11/27/2021 0820   GFRAA >60 10/18/2019 1017    No results found for: "SPEP", "UPEP"  Lab Results  Component Value Date   WBC 6.4 11/27/2021   NEUTROABS 2.7 11/27/2021   HGB 13.5 11/27/2021   HCT 38.6 (L) 11/27/2021   MCV 88.9 11/27/2021   PLT 163 11/27/2021      Chemistry      Component Value Date/Time   NA 136 11/27/2021 0820   NA 139 01/27/2017 0809   K 3.7 11/27/2021 0820   K 4.1 01/27/2017 0809   CL 105 11/27/2021 0820   CO2  25 11/27/2021 0820   CO2 23 01/27/2017 0809   BUN 26 (H) 11/27/2021 0820   BUN 14.3 01/27/2017 0809   CREATININE 1.31 (H) 11/27/2021 0820   CREATININE 0.9 01/27/2017 0809      Component Value Date/Time   CALCIUM 9.5 11/27/2021 0820   CALCIUM 9.1 01/27/2017 0809   ALKPHOS 39 11/27/2021 0820   ALKPHOS 42 01/27/2017 0809   AST 20 11/27/2021 0820   AST 10 01/27/2017 0809   ALT 26 11/27/2021 0820   ALT 18 01/27/2017 0809   BILITOT 0.5 11/27/2021 0820   BILITOT 0.72 01/27/2017 0809       RADIOGRAPHIC STUDIES: I have reviewed imaging study with the patient I have personally reviewed the radiological images as listed and agreed with the findings in the report. NM PET Image Initial (PI) Whole Body  Result Date: 11/25/2021 CLINICAL DATA:  Initial treatment strategy for multiple myeloma. EXAM: NUCLEAR MEDICINE PET WHOLE BODY TECHNIQUE: 12.1 mCi F-18 FDG was injected intravenously. Full-ring PET imaging was performed from the head to foot after the radiotracer. CT data was obtained and used for attenuation correction and anatomic localization. Fasting blood glucose: 104 mg/dl COMPARISON:  06/24/2015 CT  chest, abdomen and pelvis. 03/21/2021 spine MRI. FINDINGS: Mediastinal blood pool activity: SUV max 2.8 HEAD/NECK: No hypermetabolic activity in the scalp. No hypermetabolic cervical lymph nodes. Incidental CT findings: none CHEST: No enlarged or hypermetabolic axillary, mediastinal or hilar lymph nodes. No hypermetabolic pulmonary findings. Incidental CT findings: No significant pulmonary nodules. ABDOMEN/PELVIS: No abnormal hypermetabolic activity within the liver, pancreas, adrenal glands, or spleen. No hypermetabolic lymph nodes in the abdomen or pelvis. Incidental CT findings: A 0.9 cm mixed soft tissue and fat density lesion in the anterior lower peritoneal cavity to the left of midline (series 4/image 183), decreased from 1.3 cm on 06/24/2015 CT, considered benign, probably fat necrosis. Scattered mild colonic diverticulosis. SKELETON: Hypermetabolic patchy lytic L5 vertebral lesion with max SUV 4.6. No additional focal hypermetabolic activity to suggest skeletal metastasis. Incidental CT findings: none EXTREMITIES: No abnormal hypermetabolic activity in the lower extremities. Incidental CT findings: Metallic ligament anchor in anteromedial left tibial plateau. IMPRESSION: 1. Hypermetabolic patchy lytic L5 vertebral lesion compatible with metabolically active multiple myeloma. 2. No additional sites of hypermetabolic skeletal or extraskeletal myeloma. 3. Mild colonic diverticulosis. Electronically Signed   By: Ilona Sorrel M.D.   On: 11/25/2021 15:53

## 2021-11-27 NOTE — Assessment & Plan Note (Signed)
We discussed risk and benefits of treatment It took a while for insurance to approve his treatment After today's dose, I plan to switch him to subcutaneous daratumumab I reminded him to take aspirin for DVT prophylaxis while on Pomalyst He is also reminded to take acyclovir for antimicrobial prophylaxis I will schedule Zometa in the future after dental clearance

## 2021-11-27 NOTE — Progress Notes (Signed)
Hypersensitivity Reaction note  Date of event: 11/27/21 Time of event: 1232 Generic name of drug involved: Darzalex Name of provider notified of the hypersensitivity reaction: Bradley Hunt, [place], Dr Alvy Bimler Was agent that likely caused hypersensitivity reaction added to Allergies List within EMR? yes Chain of events including reaction signs/symptoms, treatment administered, and outcome (e.g., drug resumed; drug discontinued; sent to Emergency Department; etc.)  Pt started complaining of itching, sneezing, and cold/flu like symptoms.  Bradley Kraft PA called and came to bedside to see patient.   See flowsheets and MAR  Kathlen Brunswick, RN 11/27/2021 1:01 PM

## 2021-11-27 NOTE — Assessment & Plan Note (Signed)
Reviewed PET CT imaging with the patient He has activity noted on L5 but he is not symptomatic Observe closely I am hopeful the chemotherapy will resolve the disease

## 2021-11-27 NOTE — Progress Notes (Signed)
Hypersensitivity Reaction note  Date of event: 11/27/21 Time of event: 1350 Generic name of drug involved: Daratumumab Name of provider notified of the hypersensitivity reaction: Anda Kraft PA, Dr Alvy Bimler  2nd time- after restart Chain of events including reaction signs/symptoms, treatment administered, and outcome (e.g., drug resumed; drug discontinued; sent to Emergency Department; etc.) Pt started complaining of stomach cramping, diarrhea, and headache.  Pt was restarted on Dara and this is his second reaction.  Orders received to give Cetirizine IV, wait 30 min, and restart again with no bump-ups (leave pt at 61m/hr) until 5pm and discard remainder of bag if not completed.  See MAR and flowsheets   SKathlen Brunswick RN 11/27/2021 2:29 PM

## 2021-11-27 NOTE — Progress Notes (Signed)
DATE:  11/27/21                                        X CHEMO/IMMUNOTHERAPY REACTION           MD: Alvy Bimler   AGENT/BLOOD PRODUCT RECEIVING TODAY:              Darzalex   AGENT/BLOOD PRODUCT RECEIVING IMMEDIATELY PRIOR TO REACTION:          Darzalex   VS: BP:     178/105   P:       75       SPO2:       98% RA                BP:     166/106   P:       81       SPO2:       100% RA     REACTION(S):           throat tightness, abdominal cramping, nausea, diarrhea   PREMEDS:     Benadryl 25 mg PO, tylenol 650 mg PO, decadron 20 mg IV   INTERVENTION: Pepcid 20 mg IV, Benadryl 50 mg IV, Solumedrol 125 mg IV   Review of Systems  Review of Systems  Constitutional:  Negative for chills and fever.  HENT:  Positive for sore throat. Negative for trouble swallowing.   Respiratory:  Negative for apnea, cough, choking, chest tightness, shortness of breath, wheezing and stridor.   Cardiovascular:  Negative for chest pain and palpitations.  Gastrointestinal:  Positive for abdominal pain, diarrhea and nausea. Negative for blood in stool and vomiting.  Musculoskeletal:  Negative for arthralgias, back pain and myalgias.  Skin:  Negative for color change.     Physical Exam  Physical Exam Vitals and nursing note reviewed.  Constitutional:      Appearance: He is well-developed. He is not ill-appearing or toxic-appearing.     Comments: Airway intact, handling secretions. Speaking in full sentences.  HENT:     Head: Normocephalic.     Nose: Nose normal.  Eyes:     Conjunctiva/sclera: Conjunctivae normal.  Neck:     Vascular: No JVD.  Cardiovascular:     Rate and Rhythm: Normal rate and regular rhythm.     Pulses: Normal pulses.     Heart sounds: Normal heart sounds.  Pulmonary:     Effort: Pulmonary effort is normal. No respiratory distress.     Breath sounds: Normal breath sounds. No stridor. No wheezing, rhonchi or rales.  Abdominal:     General: There is no distension.      Palpations: Abdomen is soft.     Tenderness: There is no abdominal tenderness. There is no rebound.     Hernia: No hernia is present.  Musculoskeletal:     Cervical back: Normal range of motion.  Skin:    General: Skin is warm and dry.  Neurological:     Mental Status: He is oriented to person, place, and time.     OUTCOME:      Reaction not anaphylaxis, symptoms happened on two separate times, see RN documentation. Patient re-challenged after throat tightness per Dr. Calton Dach recommendation and then developed abdominal cramping and had an episode of diarrhea. Plan is to maximize treatment and continue at rate 50 until 5 pm today. He will be  switched to SQ for future treatments by oncologist.

## 2021-11-27 NOTE — Progress Notes (Signed)
Patient did not take home dexamethasone before arriving for infusion. Dr. Alvy Bimler added dexamethasone IV for today's visit.   Laray Anger, PharmD PGY-2 Pharmacy Resident Hematology/Oncology [phone]  11/27/2021 9:23 AM

## 2021-11-27 NOTE — Patient Instructions (Signed)
[facility]  Discharge Instructions: Thank you for choosing [facility] to provide your oncology and hematology care.   If you have a lab appointment with the [facility], please go directly to the [facility] and check in at the registration area.   Wear comfortable clothing and clothing appropriate for easy access to any Portacath or PICC line.   We strive to give you quality time with your provider. You may need to reschedule your appointment if you arrive late (15 or more minutes).  Arriving late affects you and other patients whose appointments are after yours.  Also, if you miss three or more appointments without notifying the office, you may be dismissed from the clinic at the provider's discretion.      For prescription refill requests, have your pharmacy contact our office and allow 72 hours for refills to be completed.    Today you received the following chemotherapy and/or immunotherapy agents Daratumumab      To help prevent nausea and vomiting after your treatment, we encourage you to take your nausea medication as directed.  BELOW ARE SYMPTOMS THAT SHOULD BE REPORTED IMMEDIATELY: *FEVER GREATER THAN 100.4 F (38 C) OR HIGHER *CHILLS OR SWEATING *NAUSEA AND VOMITING THAT IS NOT CONTROLLED WITH YOUR NAUSEA MEDICATION *UNUSUAL SHORTNESS OF BREATH *UNUSUAL BRUISING OR BLEEDING *URINARY PROBLEMS (pain or burning when urinating, or frequent urination) *BOWEL PROBLEMS (unusual diarrhea, constipation, pain near the anus) TENDERNESS IN MOUTH AND THROAT WITH OR WITHOUT PRESENCE OF ULCERS (sore throat, sores in mouth, or a toothache) UNUSUAL RASH, SWELLING OR PAIN  UNUSUAL VAGINAL DISCHARGE OR ITCHING   Items with * indicate a potential emergency and should be followed up as soon as possible or go to the Emergency Department if any problems should occur.  Please show the CHEMOTHERAPY ALERT CARD or IMMUNOTHERAPY ALERT CARD at check-in to  the Emergency Department and triage nurse.  Should you have questions after your visit or need to cancel or reschedule your appointment, please contact [facility]  Dept: [phone]  and follow the prompts.  Office hours are 8:00 a.m. to 4:30 p.m. Monday - Friday. Please note that voicemails left after 4:00 p.m. may not be returned until the following business day.  We are closed weekends and major holidays. You have access to a nurse at all times for urgent questions. Please call the main number to the clinic Dept: [phone] and follow the prompts.   For any non-urgent questions, you may also contact your provider using MyChart. We now offer e-Visits for anyone 81 and older to request care online for non-urgent symptoms. For details visit [email or web address].   Also download the MyChart app! Go to the app store, search "MyChart", open the app, select [facility], and log in with your MyChart username and password.  Masks are optional in the cancer centers. If you would like for your care team to wear a mask while they are taking care of you, please let them know. You may have one support person who is at least 52 years old accompany you for your appointments. Daratumumab Injection What is this medication? DARATUMUMAB (dar a toom ue mab) treats multiple myeloma, a type of bone marrow cancer. It works by helping your immune system slow or stop the spread of cancer cells. It is a monoclonal antibody. This medicine may be used for other purposes; ask your health care provider or pharmacist if you have questions. COMMON BRAND  NAME(S): DARZALEX What should I tell my care team before I take this medication? They need to know if you have any of these conditions: Hereditary fructose intolerance Infection, such as chickenpox, herpes, hepatitis B virus Lung or breathing disease, such as asthma, COPD An unusual or allergic reaction to daratumumab, sorbitol, other  medications, foods, dyes, or preservatives Pregnant or trying to get pregnant Breast-feeding How should I use this medication? This medication is injected into a vein. It is given by your care team in a hospital or clinic setting. Talk to your care team about the use of this medication in children. Special care may be needed. Overdosage: If you think you have taken too much of this medicine contact a poison control center or emergency room at once. NOTE: This medicine is only for you. Do not share this medicine with others. What if I miss a dose? Keep appointments for follow-up doses. It is important not to miss your dose. Call your care team if you are unable to keep an appointment. What may interact with this medication? Interactions have not been studied. This list may not describe all possible interactions. Give your health care provider a list of all the medicines, herbs, non-prescription drugs, or dietary supplements you use. Also tell them if you smoke, drink alcohol, or use illegal drugs. Some items may interact with your medicine. What should I watch for while using this medication? Your condition will be monitored carefully while you are receiving this medication. This medication can cause serious allergic reactions. To reduce your risk, your care team may give you other medication to take before receiving this one. Be sure to follow the directions from your care team. This medication can affect the results of blood tests to match your blood type. These changes can last for up to 6 months after the final dose. Your care team will do blood tests to match your blood type before you start treatment. Tell all of your care team that you are being treated with this medication before receiving a blood transfusion. This medication can affect the results of some tests used to determine treatment response; extra tests may be needed to evaluate response. Talk to your care team if you wish to become  pregnant or think you are pregnant. This medication can cause serious birth defects if taken during pregnancy and for 3 months after the last dose. A reliable form of contraception is recommended while taking this medication and for 3 months after the last dose. Talk to your care team about effective forms of contraception. Do not breast-feed while taking this medication. What side effects may I notice from receiving this medication? Side effects that you should report to your care team as soon as possible: Allergic reactions--skin rash, itching, hives, swelling of the face, lips, tongue, or throat Infection--fever, chills, cough, sore throat, wounds that don't heal, pain or trouble when passing urine, general feeling of discomfort or being unwell Infusion reactions--chest pain, shortness of breath or trouble breathing, feeling faint or lightheaded Unusual bruising or bleeding Side effects that usually do not require medical attention (report to your care team if they continue or are bothersome): Constipation Diarrhea Fatigue Nausea Pain, tingling, or numbness in the hands or feet Swelling of the ankles, hands, or feet This list may not describe all possible side effects. Call your doctor for medical advice about side effects. You may report side effects to FDA at 1-800-FDA-1088. Where should I keep my medication? This medication is given in  a hospital or clinic. It will not be stored at home. NOTE: This sheet is a summary. It may not cover all possible information. If you have questions about this medicine, talk to your doctor, pharmacist, or health care provider.  2023 Elsevier/Gold Standard (2021-05-09 00:00:00)

## 2021-11-28 ENCOUNTER — Other Ambulatory Visit: Payer: Self-pay | Admitting: Hematology and Oncology

## 2021-11-28 ENCOUNTER — Telehealth: Payer: Self-pay

## 2021-11-28 DIAGNOSIS — C9002 Multiple myeloma in relapse: Secondary | ICD-10-CM

## 2021-11-28 NOTE — Telephone Encounter (Signed)
-----   Message from Dionne Ano, RN sent at 11/27/2021  9:32 AM EDT ----- Regarding: follow up 1st time Daratumumab Dr Alvy Bimler 11/2

## 2021-11-28 NOTE — Telephone Encounter (Signed)
Bradley Hunt states that he is feeling fatigued today. Has a headache. He is eating, drinking, and urinating well. Suggested that he take tylenol for H/A. He knows to call the office at [phone] if he has any questions or concerns.

## 2021-11-29 ENCOUNTER — Other Ambulatory Visit: Payer: Self-pay

## 2021-11-30 ENCOUNTER — Other Ambulatory Visit: Payer: Self-pay | Admitting: Hematology and Oncology

## 2021-12-04 ENCOUNTER — Inpatient Hospital Stay: Payer: No Typology Code available for payment source | Attending: Internal Medicine

## 2021-12-04 ENCOUNTER — Other Ambulatory Visit: Payer: Self-pay

## 2021-12-04 ENCOUNTER — Inpatient Hospital Stay (HOSPITAL_BASED_OUTPATIENT_CLINIC_OR_DEPARTMENT_OTHER): Payer: No Typology Code available for payment source | Admitting: Hematology and Oncology

## 2021-12-04 ENCOUNTER — Ambulatory Visit: Payer: No Typology Code available for payment source

## 2021-12-04 ENCOUNTER — Inpatient Hospital Stay: Payer: No Typology Code available for payment source

## 2021-12-04 VITALS — BP 153/88 | HR 90 | Temp 98.2°F | Resp 18 | Ht 74.0 in | Wt 251.6 lb

## 2021-12-04 VITALS — BP 152/96 | HR 88 | Temp 98.2°F | Resp 18

## 2021-12-04 DIAGNOSIS — C9002 Multiple myeloma in relapse: Secondary | ICD-10-CM

## 2021-12-04 DIAGNOSIS — G8929 Other chronic pain: Secondary | ICD-10-CM | POA: Diagnosis not present

## 2021-12-04 DIAGNOSIS — D702 Other drug-induced agranulocytosis: Secondary | ICD-10-CM | POA: Diagnosis not present

## 2021-12-04 DIAGNOSIS — L298 Other pruritus: Secondary | ICD-10-CM | POA: Diagnosis not present

## 2021-12-04 DIAGNOSIS — T50905A Adverse effect of unspecified drugs, medicaments and biological substances, initial encounter: Secondary | ICD-10-CM | POA: Diagnosis not present

## 2021-12-04 DIAGNOSIS — Z5112 Encounter for antineoplastic immunotherapy: Secondary | ICD-10-CM | POA: Diagnosis not present

## 2021-12-04 DIAGNOSIS — T451X5A Adverse effect of antineoplastic and immunosuppressive drugs, initial encounter: Secondary | ICD-10-CM | POA: Diagnosis not present

## 2021-12-04 DIAGNOSIS — G620 Drug-induced polyneuropathy: Secondary | ICD-10-CM | POA: Insufficient documentation

## 2021-12-04 DIAGNOSIS — M549 Dorsalgia, unspecified: Secondary | ICD-10-CM | POA: Diagnosis not present

## 2021-12-04 DIAGNOSIS — Z79899 Other long term (current) drug therapy: Secondary | ICD-10-CM | POA: Insufficient documentation

## 2021-12-04 LAB — CMP (CANCER CENTER ONLY)
ALT: 39 U/L (ref 0–44)
AST: 19 U/L (ref 15–41)
Albumin: 4.1 g/dL (ref 3.5–5.0)
Alkaline Phosphatase: 48 U/L (ref 38–126)
Anion gap: 8 (ref 5–15)
BUN: 21 mg/dL — ABNORMAL HIGH (ref 6–20)
CO2: 24 mmol/L (ref 22–32)
Calcium: 9.1 mg/dL (ref 8.9–10.3)
Chloride: 105 mmol/L (ref 98–111)
Creatinine: 1.12 mg/dL (ref 0.61–1.24)
GFR, Estimated: >60 mL/min (ref >60)
Glucose, Bld: 125 mg/dL — ABNORMAL HIGH (ref 70–99)
Potassium: 3.6 mmol/L (ref 3.5–5.1)
Sodium: 137 mmol/L (ref 135–145)
Total Bilirubin: 0.5 mg/dL (ref 0.3–1.2)
Total Protein: 6.5 g/dL (ref 6.5–8.1)

## 2021-12-04 LAB — CBC WITH DIFFERENTIAL (CANCER CENTER ONLY)
Abs Immature Granulocytes: 0.01 10*3/uL (ref 0.00–0.07)
Basophils Absolute: 0 10*3/uL (ref 0.0–0.1)
Basophils Relative: 0 %
Eosinophils Absolute: 0.2 10*3/uL (ref 0.0–0.5)
Eosinophils Relative: 4 %
HCT: 38.4 % — ABNORMAL LOW (ref 39.0–52.0)
Hemoglobin: 13.5 g/dL (ref 13.0–17.0)
Immature Granulocytes: 0 %
Lymphocytes Relative: 30 %
Lymphs Abs: 1.5 10*3/uL (ref 0.7–4.0)
MCH: 31.1 pg (ref 26.0–34.0)
MCHC: 35.2 g/dL (ref 30.0–36.0)
MCV: 88.5 fL (ref 80.0–100.0)
Monocytes Absolute: 0.4 10*3/uL (ref 0.1–1.0)
Monocytes Relative: 8 %
Neutro Abs: 2.9 10*3/uL (ref 1.7–7.7)
Neutrophils Relative %: 58 %
Platelet Count: 151 10*3/uL (ref 150–400)
RBC: 4.34 MIL/uL (ref 4.22–5.81)
RDW: 12.3 % (ref 11.5–15.5)
WBC Count: 5.1 10*3/uL (ref 4.0–10.5)
nRBC: 0 % (ref 0.0–0.2)

## 2021-12-04 LAB — VITAMIN D 25 HYDROXY (VIT D DEFICIENCY, FRACTURES): Vit D, 25-Hydroxy: 31.28 ng/mL (ref 30–100)

## 2021-12-04 LAB — URIC ACID: Uric Acid, Serum: 6.2 mg/dL (ref 3.7–8.6)

## 2021-12-04 MED ORDER — MONTELUKAST SODIUM 10 MG PO TABS
10.0000 mg | ORAL_TABLET | Freq: Once | ORAL | Status: AC
  Administered 2021-12-04: 10 mg via ORAL
  Filled 2021-12-04: qty 1

## 2021-12-04 MED ORDER — DIPHENHYDRAMINE HCL 25 MG PO CAPS
25.0000 mg | ORAL_CAPSULE | Freq: Once | ORAL | Status: AC
  Administered 2021-12-04: 25 mg via ORAL
  Filled 2021-12-04: qty 1

## 2021-12-04 MED ORDER — DARATUMUMAB-HYALURONIDASE-FIHJ 1800-30000 MG-UT/15ML ~~LOC~~ SOLN
1800.0000 mg | Freq: Once | SUBCUTANEOUS | Status: AC
  Administered 2021-12-04: 1800 mg via SUBCUTANEOUS
  Filled 2021-12-04: qty 15

## 2021-12-04 MED ORDER — ACETAMINOPHEN 325 MG PO TABS
650.0000 mg | ORAL_TABLET | Freq: Once | ORAL | Status: AC
  Administered 2021-12-04: 650 mg via ORAL
  Filled 2021-12-04: qty 2

## 2021-12-04 MED ORDER — FAMOTIDINE 20 MG PO TABS
40.0000 mg | ORAL_TABLET | Freq: Once | ORAL | Status: AC
  Administered 2021-12-04: 40 mg via ORAL
  Filled 2021-12-04: qty 2

## 2021-12-04 NOTE — Progress Notes (Signed)
Confirmed w/ Dr. Alvy Bimler to proceed w/ SQ Daratumumab given pts reaction to IV.  MD feels this was not a true anaphylaxis type reaction to IV.  Kennith Center, Pharm.D., CPP 12/04/2021'@9'$ :38 AM

## 2021-12-04 NOTE — Patient Instructions (Signed)
[facility]  Discharge Instructions: Thank you for choosing [facility] to provide your oncology and hematology care.   If you have a lab appointment with the Cancer Center, please go directly to the Cancer Center and check in at the registration area.   Wear comfortable clothing and clothing appropriate for easy access to any Portacath or PICC line.   We strive to give you quality time with your provider. You may need to reschedule your appointment if you arrive late (15 or more minutes).  Arriving late affects you and other patients whose appointments are after yours.  Also, if you miss three or more appointments without notifying the office, you may be dismissed from the clinic at the provider's discretion.      For prescription refill requests, have your pharmacy contact our office and allow 72 hours for refills to be completed.    Today you received the following chemotherapy and/or immunotherapy agents: Darzalex Faspro      To help prevent nausea and vomiting after your treatment, we encourage you to take your nausea medication as directed.  BELOW ARE SYMPTOMS THAT SHOULD BE REPORTED IMMEDIATELY: *FEVER GREATER THAN 100.4 F (38 C) OR HIGHER *CHILLS OR SWEATING *NAUSEA AND VOMITING THAT IS NOT CONTROLLED WITH YOUR NAUSEA MEDICATION *UNUSUAL SHORTNESS OF BREATH *UNUSUAL BRUISING OR BLEEDING *URINARY PROBLEMS (pain or burning when urinating, or frequent urination) *BOWEL PROBLEMS (unusual diarrhea, constipation, pain near the anus) TENDERNESS IN MOUTH AND THROAT WITH OR WITHOUT PRESENCE OF ULCERS (sore throat, sores in mouth, or a toothache) UNUSUAL RASH, SWELLING OR PAIN  UNUSUAL VAGINAL DISCHARGE OR ITCHING   Items with * indicate a potential emergency and should be followed up as soon as possible or go to the Emergency Department if any problems should occur.  Please show the CHEMOTHERAPY ALERT CARD or IMMUNOTHERAPY ALERT CARD at  check-in to the Emergency Department and triage nurse.  Should you have questions after your visit or need to cancel or reschedule your appointment, please contact [facility]  Dept: [phone]  and follow the prompts.  Office hours are 8:00 a.m. to 4:30 p.m. Monday - Friday. Please note that voicemails left after 4:00 p.m. may not be returned until the following business day.  We are closed weekends and major holidays. You have access to a nurse at all times for urgent questions. Please call the main number to the clinic Dept: [phone] and follow the prompts.   For any non-urgent questions, you may also contact your provider using MyChart. We now offer e-Visits for anyone 18 and older to request care online for non-urgent symptoms. For details visit [email or web address].   Also download the MyChart app! Go to the app store, search "MyChart", open the app, select [facility], and log in with your MyChart username and password.  Masks are optional in the cancer centers. If you would like for your care team to wear a mask while they are taking care of you, please let them know. You may have one support person who is at least 52 years old accompany you for your appointments. Daratumumab; Hyaluronidase Injection What is this medication? DARATUMUMAB; HYALURONIDASE (dar a toom ue mab; hye al ur ON i dase) treats multiple myeloma, a type of bone marrow cancer. Daratumumab works by blocking a protein that causes cancer cells to grow and multiply. This helps to slow or stop the spread of cancer cells. Hyaluronidase works by increasing the absorption of   other medications in the body to help them work better. This medication may also be used treat amyloidosis, a condition that causes the buildup of a protein (amyloid) in your body. It works by reducing the buildup of this protein, which decreases symptoms. It is a combination medication that contains a monoclonal  antibody. This medicine may be used for other purposes; ask your health care provider or pharmacist if you have questions. COMMON BRAND NAME(S): DARZALEX FASPRO What should I tell my care team before I take this medication? They need to know if you have any of these conditions: Heart disease Infection, such as chickenpox, cold sores, herpes, hepatitis B Lung or breathing disease An unusual or allergic reaction to daratumumab, hyaluronidase, other medications, foods, dyes, or preservatives Pregnant or trying to get pregnant Breast-feeding How should I use this medication? This medication is injected under the skin. It is given by your care team in a hospital or clinic setting. Talk to your care team about the use of this medication in children. Special care may be needed. Overdosage: If you think you have taken too much of this medicine contact a poison control center or emergency room at once. NOTE: This medicine is only for you. Do not share this medicine with others. What if I miss a dose? Keep appointments for follow-up doses. It is important not to miss your dose. Call your care team if you are unable to keep an appointment. What may interact with this medication? Interactions have not been studied. This list may not describe all possible interactions. Give your health care provider a list of all the medicines, herbs, non-prescription drugs, or dietary supplements you use. Also tell them if you smoke, drink alcohol, or use illegal drugs. Some items may interact with your medicine. What should I watch for while using this medication? Your condition will be monitored carefully while you are receiving this medication. This medication can cause serious allergic reactions. To reduce your risk, your care team may give you other medication to take before receiving this one. Be sure to follow the directions from your care team. This medication can affect the results of blood tests to match your  blood type. These changes can last for up to 6 months after the final dose. Your care team will do blood tests to match your blood type before you start treatment. Tell all of your care team that you are being treated with this medication before receiving a blood transfusion. This medication can affect the results of some tests used to determine treatment response; extra tests may be needed to evaluate response. Talk to your care team if you wish to become pregnant or think you are pregnant. This medication can cause serious birth defects if taken during pregnancy and for 3 months after the last dose. A reliable form of contraception is recommended while taking this medication and for 3 months after the last dose. Talk to your care team about effective forms of contraception. Do not breast-feed while taking this medication. What side effects may I notice from receiving this medication? Side effects that you should report to your care team as soon as possible: Allergic reactions--skin rash, itching, hives, swelling of the face, lips, tongue, or throat Heart rhythm changes--fast or irregular heartbeat, dizziness, feeling faint or lightheaded, chest pain, trouble breathing Infection--fever, chills, cough, sore throat, wounds that don't heal, pain or trouble when passing urine, general feeling of discomfort or being unwell Infusion reactions--chest pain, shortness of breath or trouble breathing,   feeling faint or lightheaded Sudden eye pain or change in vision such as blurry vision, seeing halos around lights, vision loss Unusual bruising or bleeding Side effects that usually do not require medical attention (report to your care team if they continue or are bothersome): Constipation Diarrhea Fatigue Nausea Pain, tingling, or numbness in the hands or feet Swelling of the ankles, hands, or feet This list may not describe all possible side effects. Call your doctor for medical advice about side effects.  You may report side effects to FDA at 1-800-FDA-1088. Where should I keep my medication? This medication is given in a hospital or clinic. It will not be stored at home. NOTE: This sheet is a summary. It may not cover all possible information. If you have questions about this medicine, talk to your doctor, pharmacist, or health care provider.  2023 Elsevier/Gold Standard (2021-05-09 00:00:00)  

## 2021-12-04 NOTE — Progress Notes (Signed)
Pt states he took his dex at home before appt today

## 2021-12-04 NOTE — Progress Notes (Signed)
Pt tolerated darzalex faspro without issue today, observed for 2 hours post injection, discharged VSS, injection site clean, dry, and without redness. AVS reviewed. Ambulatory to lobby upon discharge.

## 2021-12-05 ENCOUNTER — Telehealth: Payer: Self-pay

## 2021-12-05 ENCOUNTER — Encounter: Payer: Self-pay | Admitting: Hematology and Oncology

## 2021-12-05 DIAGNOSIS — T50905A Adverse effect of unspecified drugs, medicaments and biological substances, initial encounter: Secondary | ICD-10-CM | POA: Insufficient documentation

## 2021-12-05 NOTE — Telephone Encounter (Signed)
Attempted to call pt X2 today per MD to f/u on how he is feeling. Pt returned call and states he is feeling better; benadryl and claritin has helped with itching, states the normal fatigue is the same but is unbothered by this. He knows to call should anything change or worsen.

## 2021-12-05 NOTE — Assessment & Plan Note (Signed)
His first cycle of treatment was complicated by infusion reaction with daratumumab and the intravenous form I switch him to subcutaneous daratumumab along with pomalidomide and dexamethasone He verified that he is taking dexamethasone in the morning prior to coming in and is compliant taking pomalidomide as prescribed He will continue aspirin therapy for DVT prophylaxis as well as acyclovir for antimicrobial prophylaxis We discussed importance of dental clearance before we proceed with Zometa He will continue taking calcium with vitamin D supplement right now

## 2021-12-05 NOTE — Progress Notes (Signed)
[place] OFFICE PROGRESS NOTE  Patient Care Team: [name], MD as PCP - General (Internal Medicine)  ASSESSMENT & PLAN:  Multiple myeloma in relapse [facility]) His first cycle of treatment was complicated by infusion reaction with daratumumab and the intravenous form I switch him to subcutaneous daratumumab along with pomalidomide and dexamethasone He verified that he is taking dexamethasone in the morning prior to coming in and is compliant taking pomalidomide as prescribed He will continue aspirin therapy for DVT prophylaxis as well as acyclovir for antimicrobial prophylaxis We discussed importance of dental clearance before we proceed with Zometa He will continue taking calcium with vitamin D supplement right now  Chronic back pain greater than 3 months duration PET/CT imaging showed evidence of cancer recurrence in the bone but he is feeling better since we started him on treatment We will manage this conservatively  Itching due to drug Recommend over-the-counter Benadryl as well as Claritin for skin itching  No orders of the defined types were placed in this encounter.   All questions were answered. The patient knows to call the clinic with any problems, questions or concerns. The total time spent in the appointment was 30 minutes encounter with patients including review of chart and various tests results, discussions about plan of care and coordination of care plan   Bradley Lark, MD 12/05/2021 8:39 AM  INTERVAL HISTORY: Please see below for problem oriented charting. he returns for treatment follow-up seen prior to second dose of chemotherapy With cycle 1, he could not complete his infusion due to side effects of infusion reactions He has some mild skin itching but no skin rash He has not taken any over-the-counter antihistamines for the past week His back pain is improved He brought with him all the medications and I verify that he is taking his  medications correctly  REVIEW OF SYSTEMS:   Constitutional: Denies fevers, chills or abnormal weight loss Eyes: Denies blurriness of vision Ears, nose, mouth, throat, and face: Denies mucositis or sore throat Respiratory: Denies cough, dyspnea or wheezes Cardiovascular: Denies palpitation, chest discomfort or lower extremity swelling Gastrointestinal:  Denies nausea, heartburn or change in bowel habits Skin: Denies abnormal skin rashes Lymphatics: Denies new lymphadenopathy or easy bruising Neurological:Denies numbness, tingling or new weaknesses Behavioral/Psych: Mood is stable, no new changes  All other systems were reviewed with the patient and are negative.  I have reviewed the past medical history, past surgical history, social history and family history with the patient and they are unchanged from previous note.  ALLERGIES:  is allergic to daratumumab and heparin.  MEDICATIONS:  Current Outpatient Medications  Medication Sig Dispense Refill   acyclovir (ZOVIRAX) 400 MG tablet Take 1 tablet (400 mg total) by mouth 2 (two) times daily. 60 tablet 11   aspirin EC 81 MG tablet Take 81 mg by mouth daily. Swallow whole.     calcium carbonate (TUMS - DOSED IN MG ELEMENTAL CALCIUM) 500 MG chewable tablet Chew 1 tablet by mouth 3 (three) times daily.     cholecalciferol (VITAMIN D3) 25 MCG (1000 UT) tablet Take 1,000 Units by mouth daily.     dexamethasone (DECADRON) 4 MG tablet Take 5 tablets (20 mg total) by mouth once a week. Take weekly on Tuesday mornings with food 20 tablet 11   ondansetron (ZOFRAN) 8 MG tablet Take 1 tablet (8 mg total) by mouth every 8 (eight) hours as needed for nausea or vomiting. 30 tablet 1   pomalidomide (POMALYST) 3  MG capsule Take 1 capsule (3 mg total) by mouth daily. Take for 21 days on, 7 days off, repeat every 28 days 21 capsule 0   prochlorperazine (COMPAZINE) 10 MG tablet Take 1 tablet (10 mg total) by mouth every 6 (six) hours as needed for nausea or  vomiting. 30 tablet 1   No current facility-administered medications for this visit.    SUMMARY OF ONCOLOGIC HISTORY: Oncology History  Multiple myeloma in relapse [facility])  06/23/2015 - 06/28/2015 Hospital Admission   The patient was admitted to the hospital due to gait ataxia and back pain. He was subsequently found to have cord compression underwent surgery and was discharged home   06/24/2015 Imaging   Abnormal appearance of the T6 vertebral body, highly suspicious for possible osseous metastasis. Associated pathologic fracture withup to 30% height loss. There is associated abnormal soft tissue density within the ventral epidural space,   06/24/2015 Imaging   MRI lumbar: Focal osseous lesion with abnormal enhancement involving the right pedicle of L3, suspicious for possible osseous metastasisgiven the findings in the thoracic spine. Question additional focal lesion within the right iliac wing as above.     06/25/2015 Pathology Results   Accession: [id number] bone biopsy come from plasma cell neoplasm.   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 Zometa is not given due to inability to get dental clearance   12/14/2015 - 04/24/2016 Chemotherapy   He is started on maintenance treatment with Revlimid only   12/18/2015 Imaging   MRI thoracic and lumbar spine showed numerous enhancing foci throughout the thoracic and lumbar spine with several new small foci in the lumbar spine in comparison with prior MRI compatible with metastatic disease. Stable loss of height of the T3, T4, and T6 vertebral bodies and new postsurgical changes related to T6 laminectomy. No significant epidural disease or evidence for cord  compression. No abnormal enhancement of the spinal cord or cauda equina.   05/02/2016 Bone Marrow Biopsy   Outside bone marrow biopsy showed 20% myeloma involvement   05/16/2016 Procedure   Successful placement of a right internal jugular approach power injectable Port-A-Cath. The catheter is ready for immediate use.   05/21/2016 - 07/03/2016 Chemotherapy   He received Kyprolis, Cytoxan and dexamethasone    07/08/2016 Procedure   Status post CT-guided bone marrow biopsy, with tissue specimen sent to pathology for complete histopathologic analysis   07/08/2016 Bone Marrow Biopsy   Bone Marrow, Aspirate,Biopsy, and Clot BONE MARROW: - MILDLY HYPERCELLULAR MARROW (60%) WITH PLASMA CELL NEOPLASM - SEE COMMENT PERIPHERAL BLOOD: - NORMOCYTIC ANEMIA Diagnosis Note The marrow is hypercellular with lambda-restricted plasma cells consistent with persistence of the patient's previously diagnosed plasma cell neoplasm. The plasma cells comprise approximately 10-15% of the total marrow cellularity, are enlarged, and arranged in clusters.   08/14/2016 Miscellaneous   He received conditioning treatment with melphalan   08/15/2016 Bone Marrow Transplant   He received autologous stem cell transplant   08/24/2016 - 08/29/2016 Hospital Admission   His post-transplant course was complicated by E-Coli bacteremia   11/26/2016 PET scan   PET CT at [facility] 1. Technically limited study due to soft tissue uptake. 2. New hypermetabolic uptake at C7 spinous process and left proximal femur that is  of questionable significance in absence of underlying CT correlate. Further assessment with whole body bone scan may be considered. 2. Redemonstrated nonhypermetabolic multifocal lucent and sclerotic lesions throughout the spine which are similar to prior.    12/02/2016 Bone Marrow Biopsy   He had repeat bone marrow biopsy at [facility] An immunohistochemical stain for CD138 is performed on the bone marrow core biopsy demonstrates  increased plasma cells with focal clustering (10-20% overall), which are monotypic for lambda light chain by in situ hybridization.   01/06/2017 - 06/09/2017 Chemotherapy   He received weekly Dexamethasone, Pomalyst days 1-21 and Daratumumab. From 06/08/17 onwards, he is placed on maintenance Pomalyst only.  He self discontinue Pomalyst in February 2021   07/23/2017 Procedure   Successful right IJ vein Port-A-Cath explant.   03/22/2021 Imaging   1. New large disc protrusion at L4-5 with severe spinal stenosis. 2. History of multiple myeloma with largely resolved diffuse bone marrow heterogeneity since 2017. New enhancing lesions in the L4 and L5 vertebral bodies without acute fracture. 3. Chronic thoracic compression fractures including severe T6 vertebral body height loss which has progressed from 2017. Resolved epidural tumor.     11/26/2021 PET scan   1. Hypermetabolic patchy lytic L5 vertebral lesion compatible with metabolically active multiple myeloma. 2. No additional sites of hypermetabolic skeletal or extraskeletal myeloma. 3. Mild colonic diverticulosis.   11/27/2021 - 11/27/2021 Chemotherapy   Patient is on Treatment Plan : MYELOMA Daratumumab IV + Pomalidomide + Dexamethasone q28d x 7 cycles     11/27/2021 -  Chemotherapy   Patient is on Treatment Plan : MYELOMA RELAPSED REFRACTORY Daratumumab SQ + Pomalidomide + Dexamethasone (DaraPd) q28d       PHYSICAL EXAMINATION: ECOG PERFORMANCE STATUS: 1 - Symptomatic but completely ambulatory  Vitals:   12/04/21 0811  BP: (!) 153/88  Pulse: 90  Resp: 18  Temp: 98.2 F (36.8 C)  SpO2: 100%   Filed Weights   12/04/21 0811  Weight: 251 lb 9.6 oz (114.1 kg)    GENERAL:alert, no distress and comfortable SKIN: skin color, texture, turgor are normal, no rashes or significant lesions EYES: normal, Conjunctiva are pink and non-injected, sclera clear OROPHARYNX:no exudate, no erythema and lips, buccal mucosa, and tongue normal   NECK: supple, thyroid normal size, non-tender, without nodularity LYMPH:  no palpable lymphadenopathy in the cervical, axillary or inguinal LUNGS: clear to auscultation and percussion with normal breathing effort HEART: regular rate & rhythm and no murmurs and no lower extremity edema ABDOMEN:abdomen soft, non-tender and normal bowel sounds Musculoskeletal:no cyanosis of digits and no clubbing  NEURO: alert & oriented x 3 with fluent speech, no focal motor/sensory deficits  LABORATORY DATA:  I have reviewed the data as listed    Component Value Date/Time   NA 137 12/04/2021 0751   NA 139 01/27/2017 0809   K 3.6 12/04/2021 0751   K 4.1 01/27/2017 0809   CL 105 12/04/2021 0751   CO2 24 12/04/2021 0751   CO2 23 01/27/2017 0809   GLUCOSE 125 (H) 12/04/2021 0751   GLUCOSE 122 01/27/2017 0809   BUN 21 (H) 12/04/2021 0751   BUN 14.3 01/27/2017 0809   CREATININE 1.12 12/04/2021 0751   CREATININE 0.9 01/27/2017 0809   CALCIUM 9.1 12/04/2021 0751   CALCIUM 9.1 01/27/2017 0809   PROT 6.5 12/04/2021 0751   PROT 6.2 (L) 01/27/2017 0809   ALBUMIN 4.1 12/04/2021 0751   ALBUMIN 3.7 01/27/2017 0809   AST 19 12/04/2021 0751   AST 10 01/27/2017  0809   ALT 39 12/04/2021 0751   ALT 18 01/27/2017 0809   ALKPHOS 48 12/04/2021 0751   ALKPHOS 42 01/27/2017 0809   BILITOT 0.5 12/04/2021 0751   BILITOT 0.72 01/27/2017 0809   GFRNONAA >60 12/04/2021 0751   GFRAA >60 10/18/2019 1017    No results found for: "SPEP", "UPEP"  Lab Results  Component Value Date   WBC 5.1 12/04/2021   NEUTROABS 2.9 12/04/2021   HGB 13.5 12/04/2021   HCT 38.4 (L) 12/04/2021   MCV 88.5 12/04/2021   PLT 151 12/04/2021      Chemistry      Component Value Date/Time   NA 137 12/04/2021 0751   NA 139 01/27/2017 0809   K 3.6 12/04/2021 0751   K 4.1 01/27/2017 0809   CL 105 12/04/2021 0751   CO2 24 12/04/2021 0751   CO2 23 01/27/2017 0809   BUN 21 (H) 12/04/2021 0751   BUN 14.3 01/27/2017 0809   CREATININE  1.12 12/04/2021 0751   CREATININE 0.9 01/27/2017 0809      Component Value Date/Time   CALCIUM 9.1 12/04/2021 0751   CALCIUM 9.1 01/27/2017 0809   ALKPHOS 48 12/04/2021 0751   ALKPHOS 42 01/27/2017 0809   AST 19 12/04/2021 0751   AST 10 01/27/2017 0809   ALT 39 12/04/2021 0751   ALT 18 01/27/2017 0809   BILITOT 0.5 12/04/2021 0751   BILITOT 0.72 01/27/2017 0809       RADIOGRAPHIC STUDIES: I have personally reviewed the radiological images as listed and agreed with the findings in the report. NM PET Image Initial (PI) Whole Body  Result Date: 11/25/2021 CLINICAL DATA:  Initial treatment strategy for multiple myeloma. EXAM: NUCLEAR MEDICINE PET WHOLE BODY TECHNIQUE: 12.1 mCi F-18 FDG was injected intravenously. Full-ring PET imaging was performed from the head to foot after the radiotracer. CT data was obtained and used for attenuation correction and anatomic localization. Fasting blood glucose: 104 mg/dl COMPARISON:  06/24/2015 CT chest, abdomen and pelvis. 03/21/2021 spine MRI. FINDINGS: Mediastinal blood pool activity: SUV max 2.8 HEAD/NECK: No hypermetabolic activity in the scalp. No hypermetabolic cervical lymph nodes. Incidental CT findings: none CHEST: No enlarged or hypermetabolic axillary, mediastinal or hilar lymph nodes. No hypermetabolic pulmonary findings. Incidental CT findings: No significant pulmonary nodules. ABDOMEN/PELVIS: No abnormal hypermetabolic activity within the liver, pancreas, adrenal glands, or spleen. No hypermetabolic lymph nodes in the abdomen or pelvis. Incidental CT findings: A 0.9 cm mixed soft tissue and fat density lesion in the anterior lower peritoneal cavity to the left of midline (series 4/image 183), decreased from 1.3 cm on 06/24/2015 CT, considered benign, probably fat necrosis. Scattered mild colonic diverticulosis. SKELETON: Hypermetabolic patchy lytic L5 vertebral lesion with max SUV 4.6. No additional focal hypermetabolic activity to suggest  skeletal metastasis. Incidental CT findings: none EXTREMITIES: No abnormal hypermetabolic activity in the lower extremities. Incidental CT findings: Metallic ligament anchor in anteromedial left tibial plateau. IMPRESSION: 1. Hypermetabolic patchy lytic L5 vertebral lesion compatible with metabolically active multiple myeloma. 2. No additional sites of hypermetabolic skeletal or extraskeletal myeloma. 3. Mild colonic diverticulosis. Electronically Signed   By: Ilona Sorrel M.D.   On: 11/25/2021 15:53

## 2021-12-05 NOTE — Assessment & Plan Note (Signed)
Recommend over-the-counter Benadryl as well as Claritin for skin itching

## 2021-12-05 NOTE — Assessment & Plan Note (Signed)
PET/CT imaging showed evidence of cancer recurrence in the bone but he is feeling better since we started him on treatment We will manage this conservatively

## 2021-12-05 NOTE — Telephone Encounter (Signed)
-----   Message from [name], MD sent at 12/05/2021  8:36 AM EST ----- Regarding: itching Hi Mickel Baas,  Good morning He has skin itching from treatment and I have recommended benadryl and claritin Can you call and ask how he is doing?

## 2021-12-05 NOTE — Telephone Encounter (Signed)
-----   Message from Charleston Poot, RN sent at 12/04/2021 12:25 PM EST ----- Regarding: First time/ Darzalex Faspro/ Dr Alvy Bimler pt Hey there, Pt had first time darzalex faspro today. Tolerated well.   Thanks!

## 2021-12-05 NOTE — Telephone Encounter (Signed)
Mr. Bradley Hunt states he is doing fine.  He states that he has some fatigue and a headache. Tooke tylenol with good effect.  He is eating, drinking, and urinating well.  He knows to call the office at [phone] if he has any questions or concerns.

## 2021-12-06 ENCOUNTER — Other Ambulatory Visit: Payer: Self-pay

## 2021-12-07 ENCOUNTER — Other Ambulatory Visit: Payer: Self-pay

## 2021-12-07 DIAGNOSIS — C9002 Multiple myeloma in relapse: Secondary | ICD-10-CM

## 2021-12-07 MED ORDER — POMALIDOMIDE 3 MG PO CAPS: 3.0000 mg | ORAL_CAPSULE | Freq: Every day | ORAL | 0 refills | Status: DC

## 2021-12-11 ENCOUNTER — Encounter: Payer: Self-pay | Admitting: Hematology and Oncology

## 2021-12-11 ENCOUNTER — Inpatient Hospital Stay: Payer: No Typology Code available for payment source

## 2021-12-11 ENCOUNTER — Inpatient Hospital Stay (HOSPITAL_BASED_OUTPATIENT_CLINIC_OR_DEPARTMENT_OTHER): Payer: No Typology Code available for payment source | Admitting: Hematology and Oncology

## 2021-12-11 ENCOUNTER — Other Ambulatory Visit: Payer: Self-pay

## 2021-12-11 VITALS — BP 158/94 | HR 70 | Temp 98.0°F | Resp 18 | Ht 74.0 in | Wt 250.6 lb

## 2021-12-11 VITALS — BP 143/91 | HR 70 | Temp 98.2°F | Resp 18

## 2021-12-11 DIAGNOSIS — C9002 Multiple myeloma in relapse: Secondary | ICD-10-CM | POA: Diagnosis not present

## 2021-12-11 DIAGNOSIS — G620 Drug-induced polyneuropathy: Secondary | ICD-10-CM | POA: Diagnosis not present

## 2021-12-11 DIAGNOSIS — T451X5A Adverse effect of antineoplastic and immunosuppressive drugs, initial encounter: Secondary | ICD-10-CM | POA: Diagnosis not present

## 2021-12-11 DIAGNOSIS — Z5112 Encounter for antineoplastic immunotherapy: Secondary | ICD-10-CM | POA: Diagnosis not present

## 2021-12-11 LAB — CMP (CANCER CENTER ONLY)
ALT: 37 U/L (ref 0–44)
AST: 16 U/L (ref 15–41)
Albumin: 4.4 g/dL (ref 3.5–5.0)
Alkaline Phosphatase: 40 U/L (ref 38–126)
Anion gap: 4 — ABNORMAL LOW (ref 5–15)
BUN: 21 mg/dL — ABNORMAL HIGH (ref 6–20)
CO2: 29 mmol/L (ref 22–32)
Calcium: 9.7 mg/dL (ref 8.9–10.3)
Chloride: 104 mmol/L (ref 98–111)
Creatinine: 1.05 mg/dL (ref 0.61–1.24)
GFR, Estimated: >60 mL/min (ref >60)
Glucose, Bld: 97 mg/dL (ref 70–99)
Potassium: 4.2 mmol/L (ref 3.5–5.1)
Sodium: 137 mmol/L (ref 135–145)
Total Bilirubin: 0.6 mg/dL (ref 0.3–1.2)
Total Protein: 6.8 g/dL (ref 6.5–8.1)

## 2021-12-11 LAB — CBC WITH DIFFERENTIAL (CANCER CENTER ONLY)
Abs Immature Granulocytes: 0.01 10*3/uL (ref 0.00–0.07)
Basophils Absolute: 0 10*3/uL (ref 0.0–0.1)
Basophils Relative: 1 %
Eosinophils Absolute: 0.2 10*3/uL (ref 0.0–0.5)
Eosinophils Relative: 4 %
HCT: 38.7 % — ABNORMAL LOW (ref 39.0–52.0)
Hemoglobin: 13.5 g/dL (ref 13.0–17.0)
Immature Granulocytes: 0 %
Lymphocytes Relative: 31 %
Lymphs Abs: 1.5 10*3/uL (ref 0.7–4.0)
MCH: 31.3 pg (ref 26.0–34.0)
MCHC: 34.9 g/dL (ref 30.0–36.0)
MCV: 89.6 fL (ref 80.0–100.0)
Monocytes Absolute: 0.7 10*3/uL (ref 0.1–1.0)
Monocytes Relative: 15 %
Neutro Abs: 2.3 10*3/uL (ref 1.7–7.7)
Neutrophils Relative %: 49 %
Platelet Count: 154 10*3/uL (ref 150–400)
RBC: 4.32 MIL/uL (ref 4.22–5.81)
RDW: 12.5 % (ref 11.5–15.5)
WBC Count: 4.7 10*3/uL (ref 4.0–10.5)
nRBC: 0 % (ref 0.0–0.2)

## 2021-12-11 MED ORDER — MONTELUKAST SODIUM 10 MG PO TABS
10.0000 mg | ORAL_TABLET | Freq: Once | ORAL | Status: AC
  Administered 2021-12-11: 10 mg via ORAL
  Filled 2021-12-11: qty 1

## 2021-12-11 MED ORDER — DIPHENHYDRAMINE HCL 25 MG PO CAPS
25.0000 mg | ORAL_CAPSULE | Freq: Once | ORAL | Status: AC
  Administered 2021-12-11: 25 mg via ORAL
  Filled 2021-12-11: qty 1

## 2021-12-11 MED ORDER — DARATUMUMAB-HYALURONIDASE-FIHJ 1800-30000 MG-UT/15ML ~~LOC~~ SOLN
1800.0000 mg | Freq: Once | SUBCUTANEOUS | Status: AC
  Administered 2021-12-11: 1800 mg via SUBCUTANEOUS
  Filled 2021-12-11: qty 15

## 2021-12-11 MED ORDER — ACETAMINOPHEN 325 MG PO TABS
650.0000 mg | ORAL_TABLET | Freq: Once | ORAL | Status: AC
  Administered 2021-12-11: 650 mg via ORAL
  Filled 2021-12-11: qty 2

## 2021-12-11 MED ORDER — FAMOTIDINE 20 MG PO TABS
40.0000 mg | ORAL_TABLET | Freq: Once | ORAL | Status: AC
  Administered 2021-12-11: 40 mg via ORAL
  Filled 2021-12-11: qty 2

## 2021-12-11 NOTE — Patient Instructions (Signed)
Daratumumab; Hyaluronidase Injection What is this medication? DARATUMUMAB; HYALURONIDASE (dar a toom ue mab; hye al ur ON i dase) treats multiple myeloma, a type of bone marrow cancer. Daratumumab works by blocking a protein that causes cancer cells to grow and multiply. This helps to slow or stop the spread of cancer cells. Hyaluronidase works by increasing the absorption of other medications in the body to help them work better. This medication may also be used treat amyloidosis, a condition that causes the buildup of a protein (amyloid) in your body. It works by reducing the buildup of this protein, which decreases symptoms. It is a combination medication that contains a monoclonal antibody. This medicine may be used for other purposes; ask your health care provider or pharmacist if you have questions. COMMON BRAND NAME(S): DARZALEX FASPRO What should I tell my care team before I take this medication? They need to know if you have any of these conditions: Heart disease Infection, such as chickenpox, cold sores, herpes, hepatitis B Lung or breathing disease An unusual or allergic reaction to daratumumab, hyaluronidase, other medications, foods, dyes, or preservatives Pregnant or trying to get pregnant Breast-feeding How should I use this medication? This medication is injected under the skin. It is given by your care team in a hospital or clinic setting. Talk to your care team about the use of this medication in children. Special care may be needed. Overdosage: If you think you have taken too much of this medicine contact a poison control center or emergency room at once. NOTE: This medicine is only for you. Do not share this medicine with others. What if I miss a dose? Keep appointments for follow-up doses. It is important not to miss your dose. Call your care team if you are unable to keep an appointment. What may interact with this medication? Interactions have not been studied. This list  may not describe all possible interactions. Give your health care provider a list of all the medicines, herbs, non-prescription drugs, or dietary supplements you use. Also tell them if you smoke, drink alcohol, or use illegal drugs. Some items may interact with your medicine. What should I watch for while using this medication? Your condition will be monitored carefully while you are receiving this medication. This medication can cause serious allergic reactions. To reduce your risk, your care team may give you other medication to take before receiving this one. Be sure to follow the directions from your care team. This medication can affect the results of blood tests to match your blood type. These changes can last for up to 6 months after the final dose. Your care team will do blood tests to match your blood type before you start treatment. Tell all of your care team that you are being treated with this medication before receiving a blood transfusion. This medication can affect the results of some tests used to determine treatment response; extra tests may be needed to evaluate response. Talk to your care team if you wish to become pregnant or think you are pregnant. This medication can cause serious birth defects if taken during pregnancy and for 3 months after the last dose. A reliable form of contraception is recommended while taking this medication and for 3 months after the last dose. Talk to your care team about effective forms of contraception. Do not breast-feed while taking this medication. What side effects may I notice from receiving this medication? Side effects that you should report to your care team as soon as   possible: Allergic reactions--skin rash, itching, hives, swelling of the face, lips, tongue, or throat Heart rhythm changes--fast or irregular heartbeat, dizziness, feeling faint or lightheaded, chest pain, trouble breathing Infection--fever, chills, cough, sore throat, wounds that  don't heal, pain or trouble when passing urine, general feeling of discomfort or being unwell Infusion reactions--chest pain, shortness of breath or trouble breathing, feeling faint or lightheaded Sudden eye pain or change in vision such as blurry vision, seeing halos around lights, vision loss Unusual bruising or bleeding Side effects that usually do not require medical attention (report to your care team if they continue or are bothersome): Constipation Diarrhea Fatigue Nausea Pain, tingling, or numbness in the hands or feet Swelling of the ankles, hands, or feet This list may not describe all possible side effects. Call your doctor for medical advice about side effects. You may report side effects to FDA at 1-800-FDA-1088. Where should I keep my medication? This medication is given in a hospital or clinic. It will not be stored at home. NOTE: This sheet is a summary. It may not cover all possible information. If you have questions about this medicine, talk to your doctor, pharmacist, or health care provider.  2023 Elsevier/Gold Standard (2021-05-09 00:00:00)

## 2021-12-11 NOTE — Progress Notes (Signed)
Patient took Decadron at home at 7am today

## 2021-12-11 NOTE — Assessment & Plan Note (Signed)
He tolerated subcutaneous daratumumab well with current list of premeds He will continue the same I plan to repeat myeloma panel at the end of the month at the start of cycle 2 He is reminded to take acyclovir for antimicrobial prophylaxis along with calcium and vitamin D

## 2021-12-11 NOTE — Assessment & Plan Note (Signed)
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

## 2021-12-11 NOTE — Progress Notes (Signed)
[place] OFFICE PROGRESS NOTE  Patient Care Team: [name], MD as PCP - General (Internal Medicine)  ASSESSMENT & PLAN:  Multiple myeloma in relapse [facility]) He tolerated subcutaneous daratumumab well with current list of premeds He will continue the same I plan to repeat myeloma panel at the end of the month at the start of cycle 2 He is reminded to take acyclovir for antimicrobial prophylaxis along with calcium and vitamin D  Peripheral neuropathy due to chemotherapy [facility]) He has pre-existing peripheral neuropathy from prior treatment We discussed risk and benefits of gabapentin We will observe for now  No orders of the defined types were placed in this encounter.   All questions were answered. The patient knows to call the clinic with any problems, questions or concerns. The total time spent in the appointment was 20 minutes encounter with patients including review of chart and various tests results, discussions about plan of care and coordination of care plan   [name], MD 12/11/2021 10:38 AM  INTERVAL HISTORY: Please see below for problem oriented charting. he returns for treatment follow-up He denies skin itching from subcutaneous daratumumab Complain of intermittent muscle aches He denies back pain He has mild intermittent neuropathy  REVIEW OF SYSTEMS:   Constitutional: Denies fevers, chills or abnormal weight loss Eyes: Denies blurriness of vision Ears, nose, mouth, throat, and face: Denies mucositis or sore throat Respiratory: Denies cough, dyspnea or wheezes Cardiovascular: Denies palpitation, chest discomfort or lower extremity swelling Gastrointestinal:  Denies nausea, heartburn or change in bowel habits Skin: Denies abnormal skin rashes Lymphatics: Denies new lymphadenopathy or easy bruising Behavioral/Psych: Mood is stable, no new changes  All other systems were reviewed with the patient and are negative.  I have reviewed the  past medical history, past surgical history, social history and family history with the patient and they are unchanged from previous note.  ALLERGIES:  is allergic to daratumumab and heparin.  MEDICATIONS:  Current Outpatient Medications  Medication Sig Dispense Refill   acyclovir (ZOVIRAX) 400 MG tablet Take 1 tablet (400 mg total) by mouth 2 (two) times daily. 60 tablet 11   aspirin EC 81 MG tablet Take 81 mg by mouth daily. Swallow whole.     calcium carbonate (TUMS - DOSED IN MG ELEMENTAL CALCIUM) 500 MG chewable tablet Chew 1 tablet by mouth 3 (three) times daily.     cholecalciferol (VITAMIN D3) 25 MCG (1000 UT) tablet Take 1,000 Units by mouth daily.     dexamethasone (DECADRON) 4 MG tablet Take 5 tablets (20 mg total) by mouth once a week. Take weekly on Tuesday mornings with food 20 tablet 11   ondansetron (ZOFRAN) 8 MG tablet Take 1 tablet (8 mg total) by mouth every 8 (eight) hours as needed for nausea or vomiting. 30 tablet 1   pomalidomide (POMALYST) 3 MG capsule Take 1 capsule (3 mg total) by mouth daily. Take for 21 days on, 7 days off, repeat every 28 days 21 capsule 0   prochlorperazine (COMPAZINE) 10 MG tablet Take 1 tablet (10 mg total) by mouth every 6 (six) hours as needed for nausea or vomiting. 30 tablet 1   No current facility-administered medications for this visit.    SUMMARY OF ONCOLOGIC HISTORY: Oncology History  Multiple myeloma in relapse [facility])  06/23/2015 - 06/28/2015 Hospital Admission   The patient was admitted to the hospital due to gait ataxia and back pain. He was subsequently found to have cord compression underwent surgery and  was discharged home   06/24/2015 Imaging   Abnormal appearance of the T6 vertebral body, highly suspicious for possible osseous metastasis. Associated pathologic fracture withup to 30% height loss. There is associated abnormal soft tissue density within the ventral epidural space,   06/24/2015 Imaging   MRI lumbar: Focal osseous  lesion with abnormal enhancement involving the right pedicle of L3, suspicious for possible osseous metastasisgiven the findings in the thoracic spine. Question additional focal lesion within the right iliac wing as above.     06/25/2015 Pathology Results   Accession: [id number] bone biopsy come from plasma cell neoplasm.   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 Zometa is not given due to inability to get dental clearance   12/14/2015 - 04/24/2016 Chemotherapy   He is started on maintenance treatment with Revlimid only   12/18/2015 Imaging   MRI thoracic and lumbar spine showed numerous enhancing foci throughout the thoracic and lumbar spine with several new small foci in the lumbar spine in comparison with prior MRI compatible with metastatic disease. Stable loss of height of the T3, T4, and T6 vertebral bodies and new postsurgical changes related to T6 laminectomy. No significant epidural disease or evidence for cord compression. No abnormal enhancement of the spinal cord or cauda equina.   05/02/2016 Bone Marrow Biopsy   Outside bone marrow biopsy showed 20% myeloma involvement   05/16/2016 Procedure   Successful placement of a right internal jugular approach power injectable Port-A-Cath. The catheter is ready for immediate use.   05/21/2016 - 07/03/2016 Chemotherapy   He received Kyprolis, Cytoxan and dexamethasone    07/08/2016 Procedure   Status post CT-guided bone marrow biopsy, with tissue specimen sent to pathology for complete histopathologic analysis   07/08/2016 Bone Marrow Biopsy   Bone Marrow, Aspirate,Biopsy, and Clot BONE MARROW: - MILDLY HYPERCELLULAR MARROW (60%) WITH PLASMA CELL  NEOPLASM - SEE COMMENT PERIPHERAL BLOOD: - NORMOCYTIC ANEMIA Diagnosis Note The marrow is hypercellular with lambda-restricted plasma cells consistent with persistence of the patient's previously diagnosed plasma cell neoplasm. The plasma cells comprise approximately 10-15% of the total marrow cellularity, are enlarged, and arranged in clusters.   08/14/2016 Miscellaneous   He received conditioning treatment with melphalan   08/15/2016 Bone Marrow Transplant   He received autologous stem cell transplant   08/24/2016 - 08/29/2016 Hospital Admission   His post-transplant course was complicated by E-Coli bacteremia   11/26/2016 PET scan   PET CT at [facility] 1. Technically limited study due to soft tissue uptake. 2. New hypermetabolic uptake at C7 spinous process and left proximal femur that is of questionable significance in absence of underlying CT correlate. Further assessment with whole body bone scan may be considered. 2. Redemonstrated nonhypermetabolic multifocal lucent and sclerotic lesions throughout the spine which are similar to prior.    12/02/2016 Bone Marrow Biopsy   He had repeat bone marrow biopsy at [facility] An immunohistochemical stain for CD138 is performed on the bone marrow core biopsy demonstrates increased plasma cells with focal clustering (10-20% overall), which are monotypic for lambda light chain by in situ hybridization.   01/06/2017 - 06/09/2017 Chemotherapy   He received weekly Dexamethasone, Pomalyst days 1-21 and Daratumumab. From 06/08/17 onwards, he is placed on maintenance Pomalyst  only.  He self discontinue Pomalyst in February 2021   07/23/2017 Procedure   Successful right IJ vein Port-A-Cath explant.   03/22/2021 Imaging   1. New large disc protrusion at L4-5 with severe spinal stenosis. 2. History of multiple myeloma with largely resolved diffuse bone marrow heterogeneity since 2017. New enhancing lesions in the L4 and L5 vertebral bodies without acute  fracture. 3. Chronic thoracic compression fractures including severe T6 vertebral body height loss which has progressed from 2017. Resolved epidural tumor.     11/26/2021 PET scan   1. Hypermetabolic patchy lytic L5 vertebral lesion compatible with metabolically active multiple myeloma. 2. No additional sites of hypermetabolic skeletal or extraskeletal myeloma. 3. Mild colonic diverticulosis.   11/27/2021 - 11/27/2021 Chemotherapy   Patient is on Treatment Plan : MYELOMA Daratumumab IV + Pomalidomide + Dexamethasone q28d x 7 cycles     11/27/2021 -  Chemotherapy   Patient is on Treatment Plan : MYELOMA RELAPSED REFRACTORY Daratumumab SQ + Pomalidomide + Dexamethasone (DaraPd) q28d       PHYSICAL EXAMINATION: ECOG PERFORMANCE STATUS: 1 - Symptomatic but completely ambulatory  Vitals:   12/11/21 1010  BP: (!) 158/94  Pulse: 70  Resp: 18  Temp: 98 F (36.7 C)  SpO2: 98%   Filed Weights   12/11/21 1010  Weight: 250 lb 9.6 oz (113.7 kg)    GENERAL:alert, no distress and comfortable  NEURO: alert & oriented x 3 with fluent speech, no focal motor/sensory deficits  LABORATORY DATA:  I have reviewed the data as listed    Component Value Date/Time   NA 137 12/11/2021 0945   NA 139 01/27/2017 0809   K 4.2 12/11/2021 0945   K 4.1 01/27/2017 0809   CL 104 12/11/2021 0945   CO2 29 12/11/2021 0945   CO2 23 01/27/2017 0809   GLUCOSE 97 12/11/2021 0945   GLUCOSE 122 01/27/2017 0809   BUN 21 (H) 12/11/2021 0945   BUN 14.3 01/27/2017 0809   CREATININE 1.05 12/11/2021 0945   CREATININE 0.9 01/27/2017 0809   CALCIUM 9.7 12/11/2021 0945   CALCIUM 9.1 01/27/2017 0809   PROT 6.8 12/11/2021 0945   PROT 6.2 (L) 01/27/2017 0809   ALBUMIN 4.4 12/11/2021 0945   ALBUMIN 3.7 01/27/2017 0809   AST 16 12/11/2021 0945   AST 10 01/27/2017 0809   ALT 37 12/11/2021 0945   ALT 18 01/27/2017 0809   ALKPHOS 40 12/11/2021 0945   ALKPHOS 42 01/27/2017 0809   BILITOT 0.6 12/11/2021 0945    BILITOT 0.72 01/27/2017 0809   GFRNONAA >60 12/11/2021 0945   GFRAA >60 10/18/2019 1017    No results found for: "SPEP", "UPEP"  Lab Results  Component Value Date   WBC 4.7 12/11/2021   NEUTROABS 2.3 12/11/2021   HGB 13.5 12/11/2021   HCT 38.7 (L) 12/11/2021   MCV 89.6 12/11/2021   PLT 154 12/11/2021      Chemistry      Component Value Date/Time   NA 137 12/11/2021 0945   NA 139 01/27/2017 0809   K 4.2 12/11/2021 0945   K 4.1 01/27/2017 0809   CL 104 12/11/2021 0945   CO2 29 12/11/2021 0945   CO2 23 01/27/2017 0809   BUN 21 (H) 12/11/2021 0945   BUN 14.3 01/27/2017 0809   CREATININE 1.05 12/11/2021 0945   CREATININE 0.9 01/27/2017 0809      Component Value Date/Time   CALCIUM 9.7 12/11/2021 0945   CALCIUM 9.1 01/27/2017 0809   ALKPHOS 40  12/11/2021 0945   ALKPHOS 42 01/27/2017 0809   AST 16 12/11/2021 0945   AST 10 01/27/2017 0809   ALT 37 12/11/2021 0945   ALT 18 01/27/2017 0809   BILITOT 0.6 12/11/2021 0945   BILITOT 0.72 01/27/2017 0809

## 2021-12-12 ENCOUNTER — Other Ambulatory Visit: Payer: Self-pay

## 2021-12-15 ENCOUNTER — Other Ambulatory Visit: Payer: Self-pay

## 2021-12-18 ENCOUNTER — Encounter: Payer: Self-pay | Admitting: Hematology and Oncology

## 2021-12-18 ENCOUNTER — Inpatient Hospital Stay: Payer: No Typology Code available for payment source

## 2021-12-18 ENCOUNTER — Other Ambulatory Visit: Payer: Self-pay

## 2021-12-18 ENCOUNTER — Inpatient Hospital Stay (HOSPITAL_BASED_OUTPATIENT_CLINIC_OR_DEPARTMENT_OTHER): Payer: No Typology Code available for payment source | Admitting: Hematology and Oncology

## 2021-12-18 VITALS — BP 145/94 | HR 80 | Temp 97.6°F | Resp 18 | Ht 74.0 in | Wt 251.6 lb

## 2021-12-18 DIAGNOSIS — D702 Other drug-induced agranulocytosis: Secondary | ICD-10-CM

## 2021-12-18 DIAGNOSIS — Z5112 Encounter for antineoplastic immunotherapy: Secondary | ICD-10-CM | POA: Diagnosis not present

## 2021-12-18 DIAGNOSIS — C9002 Multiple myeloma in relapse: Secondary | ICD-10-CM

## 2021-12-18 LAB — CBC WITH DIFFERENTIAL (CANCER CENTER ONLY)
Abs Immature Granulocytes: 0 10*3/uL (ref 0.00–0.07)
Basophils Absolute: 0.1 10*3/uL (ref 0.0–0.1)
Basophils Relative: 2 %
Eosinophils Absolute: 0.2 10*3/uL (ref 0.0–0.5)
Eosinophils Relative: 6 %
HCT: 39.2 % (ref 39.0–52.0)
Hemoglobin: 13.6 g/dL (ref 13.0–17.0)
Immature Granulocytes: 0 %
Lymphocytes Relative: 47 %
Lymphs Abs: 2 10*3/uL (ref 0.7–4.0)
MCH: 31.3 pg (ref 26.0–34.0)
MCHC: 34.7 g/dL (ref 30.0–36.0)
MCV: 90.1 fL (ref 80.0–100.0)
Monocytes Absolute: 0.6 10*3/uL (ref 0.1–1.0)
Monocytes Relative: 14 %
Neutro Abs: 1.3 10*3/uL — ABNORMAL LOW (ref 1.7–7.7)
Neutrophils Relative %: 31 %
Platelet Count: 166 10*3/uL (ref 150–400)
RBC: 4.35 MIL/uL (ref 4.22–5.81)
RDW: 12.8 % (ref 11.5–15.5)
WBC Count: 4.2 10*3/uL (ref 4.0–10.5)
nRBC: 0 % (ref 0.0–0.2)

## 2021-12-18 LAB — CMP (CANCER CENTER ONLY)
ALT: 24 U/L (ref 0–44)
AST: 14 U/L — ABNORMAL LOW (ref 15–41)
Albumin: 4.4 g/dL (ref 3.5–5.0)
Alkaline Phosphatase: 41 U/L (ref 38–126)
Anion gap: 5 (ref 5–15)
BUN: 25 mg/dL — ABNORMAL HIGH (ref 6–20)
CO2: 28 mmol/L (ref 22–32)
Calcium: 9.5 mg/dL (ref 8.9–10.3)
Chloride: 106 mmol/L (ref 98–111)
Creatinine: 1.16 mg/dL (ref 0.61–1.24)
GFR, Estimated: >60 mL/min (ref >60)
Glucose, Bld: 122 mg/dL — ABNORMAL HIGH (ref 70–99)
Potassium: 4 mmol/L (ref 3.5–5.1)
Sodium: 139 mmol/L (ref 135–145)
Total Bilirubin: 0.5 mg/dL (ref 0.3–1.2)
Total Protein: 6.8 g/dL (ref 6.5–8.1)

## 2021-12-18 MED ORDER — ACETAMINOPHEN 325 MG PO TABS
650.0000 mg | ORAL_TABLET | Freq: Once | ORAL | Status: AC
  Administered 2021-12-18: 650 mg via ORAL
  Filled 2021-12-18: qty 2

## 2021-12-18 MED ORDER — DIPHENHYDRAMINE HCL 25 MG PO CAPS
25.0000 mg | ORAL_CAPSULE | Freq: Once | ORAL | Status: AC
  Administered 2021-12-18: 25 mg via ORAL
  Filled 2021-12-18: qty 1

## 2021-12-18 MED ORDER — DARATUMUMAB-HYALURONIDASE-FIHJ 1800-30000 MG-UT/15ML ~~LOC~~ SOLN
1800.0000 mg | Freq: Once | SUBCUTANEOUS | Status: AC
  Administered 2021-12-18: 1800 mg via SUBCUTANEOUS
  Filled 2021-12-18: qty 15

## 2021-12-18 MED ORDER — FAMOTIDINE 20 MG PO TABS
40.0000 mg | ORAL_TABLET | Freq: Once | ORAL | Status: AC
  Administered 2021-12-18: 40 mg via ORAL
  Filled 2021-12-18: qty 2

## 2021-12-18 NOTE — Progress Notes (Signed)
Pt tolerated third Darzalex Faspro injection well and without incident.

## 2021-12-18 NOTE — Assessment & Plan Note (Signed)
He tolerated subcutaneous daratumumab well with current list of premeds He will continue the same I plan to repeat myeloma panel at the end of the month at the start of cycle 2 He is reminded to take acyclovir for antimicrobial prophylaxis along with calcium and vitamin D Due to inability to get dental clearance, I have not prescribed Zometa We discussed importance of getting dental clearance before Zometa Once I am able to document good response to therapy, we can taper dexamethasone

## 2021-12-18 NOTE — Progress Notes (Signed)
[facility] OFFICE PROGRESS NOTE  Patient Care Team: [name], MD as PCP - General (Internal Medicine)  ASSESSMENT & PLAN:  Multiple myeloma in relapse [facility]) He tolerated subcutaneous daratumumab well with current list of premeds He will continue the same I plan to repeat myeloma panel at the end of the month at the start of cycle 2 He is reminded to take acyclovir for antimicrobial prophylaxis along with calcium and vitamin D Due to inability to get dental clearance, I have not prescribed Zometa We discussed importance of getting dental clearance before Zometa Once I am able to document good response to therapy, we can taper dexamethasone  Drug-induced neutropenia ([facility]) This is likely due to recent treatment. The patient denies recent history of fevers, cough, chills, diarrhea or dysuria. He is asymptomatic from the leukopenia. I will observe for now.  I will continue the chemotherapy at current dose without dosage adjustment.  If the leukopenia gets progressive worse in the future, I might have to delay his treatment or adjust the chemotherapy dose.    No orders of the defined types were placed in this encounter.   All questions were answered. The patient knows to call the clinic with any problems, questions or concerns. The total time spent in the appointment was 20 minutes encounter with patients including review of chart and various tests results, discussions about plan of care and coordination of care plan   [name], MD 12/18/2021 12:55 PM  INTERVAL HISTORY: Please see below for problem oriented charting. he returns for treatment follow-up on treatment with Pomalyst and daratumumab along with dexamethasone He tolerated treatment well He denies reaction to treatment  REVIEW OF SYSTEMS:   Constitutional: Denies fevers, chills or abnormal weight loss Eyes: Denies blurriness of vision Ears, nose, mouth, throat, and face: Denies mucositis or sore  throat Respiratory: Denies cough, dyspnea or wheezes Cardiovascular: Denies palpitation, chest discomfort or lower extremity swelling Gastrointestinal:  Denies nausea, heartburn or change in bowel habits Skin: Denies abnormal skin rashes Lymphatics: Denies new lymphadenopathy or easy bruising Neurological:Denies numbness, tingling or new weaknesses Behavioral/Psych: Mood is stable, no new changes  All other systems were reviewed with the patient and are negative.  I have reviewed the past medical history, past surgical history, social history and family history with the patient and they are unchanged from previous note.  ALLERGIES:  is allergic to daratumumab and heparin.  MEDICATIONS:  Current Outpatient Medications  Medication Sig Dispense Refill   acyclovir (ZOVIRAX) 400 MG tablet Take 1 tablet (400 mg total) by mouth 2 (two) times daily. 60 tablet 11   aspirin EC 81 MG tablet Take 81 mg by mouth daily. Swallow whole.     calcium carbonate (TUMS - DOSED IN MG ELEMENTAL CALCIUM) 500 MG chewable tablet Chew 1 tablet by mouth 3 (three) times daily.     cholecalciferol (VITAMIN D3) 25 MCG (1000 UT) tablet Take 1,000 Units by mouth daily.     dexamethasone (DECADRON) 4 MG tablet Take 5 tablets (20 mg total) by mouth once a week. Take weekly on Tuesday mornings with food 20 tablet 11   ondansetron (ZOFRAN) 8 MG tablet Take 1 tablet (8 mg total) by mouth every 8 (eight) hours as needed for nausea or vomiting. 30 tablet 1   pomalidomide (POMALYST) 3 MG capsule Take 1 capsule (3 mg total) by mouth daily. Take for 21 days on, 7 days off, repeat every 28 days 21 capsule 0   prochlorperazine (COMPAZINE)  10 MG tablet Take 1 tablet (10 mg total) by mouth every 6 (six) hours as needed for nausea or vomiting. 30 tablet 1   No current facility-administered medications for this visit.    SUMMARY OF ONCOLOGIC HISTORY: Oncology History  Multiple myeloma in relapse [facility])  06/23/2015 - 06/28/2015 Hospital  Admission   The patient was admitted to the hospital due to gait ataxia and back pain. He was subsequently found to have cord compression underwent surgery and was discharged home   06/24/2015 Imaging   Abnormal appearance of the T6 vertebral body, highly suspicious for possible osseous metastasis. Associated pathologic fracture withup to 30% height loss. There is associated abnormal soft tissue density within the ventral epidural space,   06/24/2015 Imaging   MRI lumbar: Focal osseous lesion with abnormal enhancement involving the right pedicle of L3, suspicious for possible osseous metastasisgiven the findings in the thoracic spine. Question additional focal lesion within the right iliac wing as above.     06/25/2015 Pathology Results   Accession: [id number] bone biopsy come from plasma cell neoplasm.   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 Zometa is not given due to inability to get dental clearance   12/14/2015 - 04/24/2016 Chemotherapy   He is started on maintenance treatment with Revlimid only   12/18/2015 Imaging   MRI thoracic and lumbar spine showed numerous enhancing foci throughout the thoracic and lumbar spine with several new small foci in the lumbar spine in comparison with prior MRI compatible with metastatic disease. Stable loss of height of the T3, T4, and T6 vertebral bodies and new postsurgical changes related to T6 laminectomy. No significant epidural disease or evidence for cord compression. No abnormal enhancement of the spinal cord or cauda equina.   05/02/2016 Bone Marrow Biopsy   Outside bone marrow biopsy showed 20% myeloma involvement   05/16/2016 Procedure   Successful placement  of a right internal jugular approach power injectable Port-A-Cath. The catheter is ready for immediate use.   05/21/2016 - 07/03/2016 Chemotherapy   He received Kyprolis, Cytoxan and dexamethasone    07/08/2016 Procedure   Status post CT-guided bone marrow biopsy, with tissue specimen sent to pathology for complete histopathologic analysis   07/08/2016 Bone Marrow Biopsy   Bone Marrow, Aspirate,Biopsy, and Clot BONE MARROW: - MILDLY HYPERCELLULAR MARROW (60%) WITH PLASMA CELL NEOPLASM - SEE COMMENT PERIPHERAL BLOOD: - NORMOCYTIC ANEMIA Diagnosis Note The marrow is hypercellular with lambda-restricted plasma cells consistent with persistence of the patient's previously diagnosed plasma cell neoplasm. The plasma cells comprise approximately 10-15% of the total marrow cellularity, are enlarged, and arranged in clusters.   08/14/2016 Miscellaneous   He received conditioning treatment with melphalan   08/15/2016 Bone Marrow Transplant   He received autologous stem cell transplant   08/24/2016 - 08/29/2016 Hospital Admission   His post-transplant course was complicated by E-Coli bacteremia   11/26/2016 PET scan   PET CT at [facility] 1. Technically limited study due to soft tissue uptake. 2. New hypermetabolic uptake at C7 spinous process and left proximal femur that is of questionable significance in absence of underlying CT correlate. Further assessment with whole body bone scan may be considered. 2. Redemonstrated nonhypermetabolic multifocal lucent and sclerotic lesions throughout the spine  which are similar to prior.    12/02/2016 Bone Marrow Biopsy   He had repeat bone marrow biopsy at [facility] An immunohistochemical stain for CD138 is performed on the bone marrow core biopsy demonstrates increased plasma cells with focal clustering (10-20% overall), which are monotypic for lambda light chain by in situ hybridization.   01/06/2017 - 06/09/2017 Chemotherapy   He received weekly Dexamethasone,  Pomalyst days 1-21 and Daratumumab. From 06/08/17 onwards, he is placed on maintenance Pomalyst only.  He self discontinue Pomalyst in February 2021   07/23/2017 Procedure   Successful right IJ vein Port-A-Cath explant.   03/22/2021 Imaging   1. New large disc protrusion at L4-5 with severe spinal stenosis. 2. History of multiple myeloma with largely resolved diffuse bone marrow heterogeneity since 2017. New enhancing lesions in the L4 and L5 vertebral bodies without acute fracture. 3. Chronic thoracic compression fractures including severe T6 vertebral body height loss which has progressed from 2017. Resolved epidural tumor.     11/26/2021 PET scan   1. Hypermetabolic patchy lytic L5 vertebral lesion compatible with metabolically active multiple myeloma. 2. No additional sites of hypermetabolic skeletal or extraskeletal myeloma. 3. Mild colonic diverticulosis.   11/27/2021 - 11/27/2021 Chemotherapy   Patient is on Treatment Plan : MYELOMA Daratumumab IV + Pomalidomide + Dexamethasone q28d x 7 cycles     11/27/2021 -  Chemotherapy   Patient is on Treatment Plan : MYELOMA RELAPSED REFRACTORY Daratumumab SQ + Pomalidomide + Dexamethasone (DaraPd) q28d       PHYSICAL EXAMINATION: ECOG PERFORMANCE STATUS: 0 - Asymptomatic  Vitals:   12/18/21 0805  BP: (!) 145/94  Pulse: 80  Resp: 18  Temp: 97.6 F (36.4 C)  SpO2: 100%   Filed Weights   12/18/21 0805  Weight: 251 lb 9.6 oz (114.1 kg)    GENERAL:alert, no distress and comfortable SKIN: skin color, texture, turgor are normal, no rashes or significant lesions EYES: normal, Conjunctiva are pink and non-injected, sclera clear OROPHARYNX:no exudate, no erythema and lips, buccal mucosa, and tongue normal  NECK: supple, thyroid normal size, non-tender, without nodularity LYMPH:  no palpable lymphadenopathy in the cervical, axillary or inguinal LUNGS: clear to auscultation and percussion with normal breathing effort HEART: regular rate  & rhythm and no murmurs and no lower extremity edema ABDOMEN:abdomen soft, non-tender and normal bowel sounds Musculoskeletal:no cyanosis of digits and no clubbing  NEURO: alert & oriented x 3 with fluent speech, no focal motor/sensory deficits  LABORATORY DATA:  I have reviewed the data as listed    Component Value Date/Time   NA 139 12/18/2021 0743   NA 139 01/27/2017 0809   K 4.0 12/18/2021 0743   K 4.1 01/27/2017 0809   CL 106 12/18/2021 0743   CO2 28 12/18/2021 0743   CO2 23 01/27/2017 0809   GLUCOSE 122 (H) 12/18/2021 0743   GLUCOSE 122 01/27/2017 0809   BUN 25 (H) 12/18/2021 0743   BUN 14.3 01/27/2017 0809   CREATININE 1.16 12/18/2021 0743   CREATININE 0.9 01/27/2017 0809   CALCIUM 9.5 12/18/2021 0743   CALCIUM 9.1 01/27/2017 0809   PROT 6.8 12/18/2021 0743   PROT 6.2 (L) 01/27/2017 0809   ALBUMIN 4.4 12/18/2021 0743   ALBUMIN 3.7 01/27/2017 0809   AST 14 (L) 12/18/2021 0743   AST 10 01/27/2017 0809   ALT 24 12/18/2021 0743   ALT 18 01/27/2017 0809   ALKPHOS 41 12/18/2021 0743   ALKPHOS 42 01/27/2017 0809   BILITOT 0.5 12/18/2021 0743  BILITOT 0.72 01/27/2017 0809   GFRNONAA >60 12/18/2021 0743   GFRAA >60 10/18/2019 1017    No results found for: "SPEP", "UPEP"  Lab Results  Component Value Date   WBC 4.2 12/18/2021   NEUTROABS 1.3 (L) 12/18/2021   HGB 13.6 12/18/2021   HCT 39.2 12/18/2021   MCV 90.1 12/18/2021   PLT 166 12/18/2021      Chemistry      Component Value Date/Time   NA 139 12/18/2021 0743   NA 139 01/27/2017 0809   K 4.0 12/18/2021 0743   K 4.1 01/27/2017 0809   CL 106 12/18/2021 0743   CO2 28 12/18/2021 0743   CO2 23 01/27/2017 0809   BUN 25 (H) 12/18/2021 0743   BUN 14.3 01/27/2017 0809   CREATININE 1.16 12/18/2021 0743   CREATININE 0.9 01/27/2017 0809      Component Value Date/Time   CALCIUM 9.5 12/18/2021 0743   CALCIUM 9.1 01/27/2017 0809   ALKPHOS 41 12/18/2021 0743   ALKPHOS 42 01/27/2017 0809   AST 14 (L) 12/18/2021  0743   AST 10 01/27/2017 0809   ALT 24 12/18/2021 0743   ALT 18 01/27/2017 0809   BILITOT 0.5 12/18/2021 0743   BILITOT 0.72 01/27/2017 0809       RADIOGRAPHIC STUDIES: I have personally reviewed the radiological images as listed and agreed with the findings in the report. NM PET Image Initial (PI) Whole Body  Result Date: 11/25/2021 CLINICAL DATA:  Initial treatment strategy for multiple myeloma. EXAM: NUCLEAR MEDICINE PET WHOLE BODY TECHNIQUE: 12.1 mCi F-18 FDG was injected intravenously. Full-ring PET imaging was performed from the head to foot after the radiotracer. CT data was obtained and used for attenuation correction and anatomic localization. Fasting blood glucose: 104 mg/dl COMPARISON:  06/24/2015 CT chest, abdomen and pelvis. 03/21/2021 spine MRI. FINDINGS: Mediastinal blood pool activity: SUV max 2.8 HEAD/NECK: No hypermetabolic activity in the scalp. No hypermetabolic cervical lymph nodes. Incidental CT findings: none CHEST: No enlarged or hypermetabolic axillary, mediastinal or hilar lymph nodes. No hypermetabolic pulmonary findings. Incidental CT findings: No significant pulmonary nodules. ABDOMEN/PELVIS: No abnormal hypermetabolic activity within the liver, pancreas, adrenal glands, or spleen. No hypermetabolic lymph nodes in the abdomen or pelvis. Incidental CT findings: A 0.9 cm mixed soft tissue and fat density lesion in the anterior lower peritoneal cavity to the left of midline (series 4/image 183), decreased from 1.3 cm on 06/24/2015 CT, considered benign, probably fat necrosis. Scattered mild colonic diverticulosis. SKELETON: Hypermetabolic patchy lytic L5 vertebral lesion with max SUV 4.6. No additional focal hypermetabolic activity to suggest skeletal metastasis. Incidental CT findings: none EXTREMITIES: No abnormal hypermetabolic activity in the lower extremities. Incidental CT findings: Metallic ligament anchor in anteromedial left tibial plateau. IMPRESSION: 1.  Hypermetabolic patchy lytic L5 vertebral lesion compatible with metabolically active multiple myeloma. 2. No additional sites of hypermetabolic skeletal or extraskeletal myeloma. 3. Mild colonic diverticulosis. Electronically Signed   By: Ilona Sorrel M.D.   On: 11/25/2021 15:53

## 2021-12-18 NOTE — Assessment & Plan Note (Signed)
This is likely due to recent treatment. The patient denies recent history of fevers, cough, chills, diarrhea or dysuria. He is asymptomatic from the leukopenia. I will observe for now.  I will continue the chemotherapy at current dose without dosage adjustment.  If the leukopenia gets progressive worse in the future, I might have to delay his treatment or adjust the chemotherapy dose. 

## 2021-12-18 NOTE — Patient Instructions (Signed)
[facility] ONCOLOGY  Discharge Instructions: Thank you for choosing [facility] to provide your oncology and hematology care.   If you have a lab appointment with the [facility], please go directly to the [facility] and check in at the registration area.   Wear comfortable clothing and clothing appropriate for easy access to any Portacath or PICC line.   We strive to give you quality time with your provider. You may need to reschedule your appointment if you arrive late (15 or more minutes).  Arriving late affects you and other patients whose appointments are after yours.  Also, if you miss three or more appointments without notifying the office, you may be dismissed from the clinic at the provider's discretion.      For prescription refill requests, have your pharmacy contact our office and allow 72 hours for refills to be completed.    Today you received the following chemotherapy and/or immunotherapy agents: Darzalex Faspro      To help prevent nausea and vomiting after your treatment, we encourage you to take your nausea medication as directed.  BELOW ARE SYMPTOMS THAT SHOULD BE REPORTED IMMEDIATELY: *FEVER GREATER THAN 100.4 F (38 C) OR HIGHER *CHILLS OR SWEATING *NAUSEA AND VOMITING THAT IS NOT CONTROLLED WITH YOUR NAUSEA MEDICATION *UNUSUAL SHORTNESS OF BREATH *UNUSUAL BRUISING OR BLEEDING *URINARY PROBLEMS (pain or burning when urinating, or frequent urination) *BOWEL PROBLEMS (unusual diarrhea, constipation, pain near the anus) TENDERNESS IN MOUTH AND THROAT WITH OR WITHOUT PRESENCE OF ULCERS (sore throat, sores in mouth, or a toothache) UNUSUAL RASH, SWELLING OR PAIN  UNUSUAL VAGINAL DISCHARGE OR ITCHING   Items with * indicate a potential emergency and should be followed up as soon as possible or go to the Emergency Department if any problems should occur.  Please show the CHEMOTHERAPY ALERT CARD or IMMUNOTHERAPY ALERT CARD at  check-in to the Emergency Department and triage nurse.  Should you have questions after your visit or need to cancel or reschedule your appointment, please contact [facility]  Dept: [phone]  and follow the prompts.  Office hours are 8:00 a.m. to 4:30 p.m. Monday - Friday. Please note that voicemails left after 4:00 p.m. may not be returned until the following business day.  We are closed weekends and major holidays. You have access to a nurse at all times for urgent questions. Please call the main number to the clinic Dept: [phone] and follow the prompts.   For any non-urgent questions, you may also contact your provider using MyChart. We now offer e-Visits for anyone 77 and older to request care online for non-urgent symptoms. For details visit [email or web address].   Also download the MyChart app! Go to the app store, search "MyChart", open the app, select [facility], and log in with your MyChart username and password.  Masks are optional in the cancer centers. If you would like for your care team to wear a mask while they are taking care of you, please let them know. You may have one support person who is at least 52 years old accompany you for your appointments.

## 2021-12-18 NOTE — Progress Notes (Signed)
Per Dr. Alvy Bimler OK to proceed with ANC 1.3 K/uL today Pt informed this RN that he took PO dexamethasone at home prior to his appts today.

## 2021-12-25 ENCOUNTER — Inpatient Hospital Stay: Payer: No Typology Code available for payment source

## 2021-12-25 ENCOUNTER — Other Ambulatory Visit: Payer: Self-pay

## 2021-12-25 VITALS — BP 141/98 | HR 76 | Temp 98.4°F | Resp 16

## 2021-12-25 DIAGNOSIS — Z5112 Encounter for antineoplastic immunotherapy: Secondary | ICD-10-CM | POA: Diagnosis not present

## 2021-12-25 DIAGNOSIS — C9002 Multiple myeloma in relapse: Secondary | ICD-10-CM

## 2021-12-25 LAB — CBC WITH DIFFERENTIAL (CANCER CENTER ONLY)
Abs Immature Granulocytes: 0.01 10*3/uL (ref 0.00–0.07)
Basophils Absolute: 0.1 10*3/uL (ref 0.0–0.1)
Basophils Relative: 2 %
Eosinophils Absolute: 0.2 10*3/uL (ref 0.0–0.5)
Eosinophils Relative: 4 %
HCT: 39.4 % (ref 39.0–52.0)
Hemoglobin: 13.7 g/dL (ref 13.0–17.0)
Immature Granulocytes: 0 %
Lymphocytes Relative: 47 %
Lymphs Abs: 2.4 10*3/uL (ref 0.7–4.0)
MCH: 31.6 pg (ref 26.0–34.0)
MCHC: 34.8 g/dL (ref 30.0–36.0)
MCV: 91 fL (ref 80.0–100.0)
Monocytes Absolute: 0.7 10*3/uL (ref 0.1–1.0)
Monocytes Relative: 14 %
Neutro Abs: 1.7 10*3/uL (ref 1.7–7.7)
Neutrophils Relative %: 33 %
Platelet Count: 230 10*3/uL (ref 150–400)
RBC: 4.33 MIL/uL (ref 4.22–5.81)
RDW: 12.7 % (ref 11.5–15.5)
WBC Count: 5.1 10*3/uL (ref 4.0–10.5)
nRBC: 0 % (ref 0.0–0.2)

## 2021-12-25 LAB — CMP (CANCER CENTER ONLY)
ALT: 21 U/L (ref 0–44)
AST: 15 U/L (ref 15–41)
Albumin: 4.5 g/dL (ref 3.5–5.0)
Alkaline Phosphatase: 42 U/L (ref 38–126)
Anion gap: 6 (ref 5–15)
BUN: 23 mg/dL — ABNORMAL HIGH (ref 6–20)
CO2: 28 mmol/L (ref 22–32)
Calcium: 10 mg/dL (ref 8.9–10.3)
Chloride: 104 mmol/L (ref 98–111)
Creatinine: 1.2 mg/dL (ref 0.61–1.24)
GFR, Estimated: >60 mL/min (ref >60)
Glucose, Bld: 112 mg/dL — ABNORMAL HIGH (ref 70–99)
Potassium: 4 mmol/L (ref 3.5–5.1)
Sodium: 138 mmol/L (ref 135–145)
Total Bilirubin: 0.6 mg/dL (ref 0.3–1.2)
Total Protein: 6.9 g/dL (ref 6.5–8.1)

## 2021-12-25 MED ORDER — DIPHENHYDRAMINE HCL 25 MG PO CAPS
25.0000 mg | ORAL_CAPSULE | Freq: Once | ORAL | Status: AC
  Administered 2021-12-25: 25 mg via ORAL
  Filled 2021-12-25: qty 1

## 2021-12-25 MED ORDER — DARATUMUMAB-HYALURONIDASE-FIHJ 1800-30000 MG-UT/15ML ~~LOC~~ SOLN
1800.0000 mg | Freq: Once | SUBCUTANEOUS | Status: AC
  Administered 2021-12-25: 1800 mg via SUBCUTANEOUS
  Filled 2021-12-25: qty 15

## 2021-12-25 MED ORDER — FAMOTIDINE 20 MG PO TABS
40.0000 mg | ORAL_TABLET | Freq: Once | ORAL | Status: AC
  Administered 2021-12-25: 40 mg via ORAL
  Filled 2021-12-25: qty 2

## 2021-12-25 MED ORDER — ACETAMINOPHEN 325 MG PO TABS
650.0000 mg | ORAL_TABLET | Freq: Once | ORAL | Status: AC
  Administered 2021-12-25: 650 mg via ORAL
  Filled 2021-12-25: qty 2

## 2021-12-25 NOTE — Progress Notes (Signed)
Pt reports he took PO steroids at home this morning.

## 2021-12-25 NOTE — Patient Instructions (Signed)
[facility] ONCOLOGY  Discharge Instructions: Thank you for choosing [facility] to provide your oncology and hematology care.   If you have a lab appointment with the [facility], please go directly to the [facility] and check in at the registration area.   Wear comfortable clothing and clothing appropriate for easy access to any Portacath or PICC line.   We strive to give you quality time with your provider. You may need to reschedule your appointment if you arrive late (15 or more minutes).  Arriving late affects you and other patients whose appointments are after yours.  Also, if you miss three or more appointments without notifying the office, you may be dismissed from the clinic at the provider's discretion.      For prescription refill requests, have your pharmacy contact our office and allow 72 hours for refills to be completed.    Today you received the following chemotherapy and/or immunotherapy agents: daratumumab-hyaluronidase-fihj      To help prevent nausea and vomiting after your treatment, we encourage you to take your nausea medication as directed.  BELOW ARE SYMPTOMS THAT SHOULD BE REPORTED IMMEDIATELY: *FEVER GREATER THAN 100.4 F (38 C) OR HIGHER *CHILLS OR SWEATING *NAUSEA AND VOMITING THAT IS NOT CONTROLLED WITH YOUR NAUSEA MEDICATION *UNUSUAL SHORTNESS OF BREATH *UNUSUAL BRUISING OR BLEEDING *URINARY PROBLEMS (pain or burning when urinating, or frequent urination) *BOWEL PROBLEMS (unusual diarrhea, constipation, pain near the anus) TENDERNESS IN MOUTH AND THROAT WITH OR WITHOUT PRESENCE OF ULCERS (sore throat, sores in mouth, or a toothache) UNUSUAL RASH, SWELLING OR PAIN  UNUSUAL VAGINAL DISCHARGE OR ITCHING   Items with * indicate a potential emergency and should be followed up as soon as possible or go to the Emergency Department if any problems should occur.  Please show the CHEMOTHERAPY ALERT CARD or IMMUNOTHERAPY ALERT  CARD at check-in to the Emergency Department and triage nurse.  Should you have questions after your visit or need to cancel or reschedule your appointment, please contact [facility]  Dept: [phone]  and follow the prompts.  Office hours are 8:00 a.m. to 4:30 p.m. Monday - Friday. Please note that voicemails left after 4:00 p.m. may not be returned until the following business day.  We are closed weekends and major holidays. You have access to a nurse at all times for urgent questions. Please call the main number to the clinic Dept: [phone] and follow the prompts.   For any non-urgent questions, you may also contact your provider using MyChart. We now offer e-Visits for anyone 53 and older to request care online for non-urgent symptoms. For details visit [email or web address].   Also download the MyChart app! Go to the app store, search "MyChart", open the app, select [facility], and log in with your MyChart username and password.  Masks are optional in the cancer centers. If you would like for your care team to wear a mask while they are taking care of you, please let them know. You may have one support person who is at least 52 years old accompany you for your appointments.

## 2021-12-26 LAB — KAPPA/LAMBDA LIGHT CHAINS
Kappa free light chain: 6.7 mg/L (ref 3.3–19.4)
Kappa, lambda light chain ratio: 0.02 — ABNORMAL LOW (ref 0.26–1.65)
Lambda free light chains: 283.2 mg/L — ABNORMAL HIGH (ref 5.7–26.3)

## 2021-12-27 ENCOUNTER — Other Ambulatory Visit: Payer: Self-pay

## 2021-12-27 LAB — MULTIPLE MYELOMA PANEL, SERUM
Albumin SerPl Elph-Mcnc: 3.8 g/dL (ref 2.9–4.4)
Albumin/Glob SerPl: 1.6 (ref 0.7–1.7)
Alpha 1: 0.2 g/dL (ref 0.0–0.4)
Alpha2 Glob SerPl Elph-Mcnc: 0.6 g/dL (ref 0.4–1.0)
B-Globulin SerPl Elph-Mcnc: 0.9 g/dL (ref 0.7–1.3)
Gamma Glob SerPl Elph-Mcnc: 0.7 g/dL (ref 0.4–1.8)
Globulin, Total: 2.5 g/dL (ref 2.2–3.9)
IgA: 41 mg/dL — ABNORMAL LOW (ref 90–386)
IgG (Immunoglobin G), Serum: 770 mg/dL (ref 603–1613)
IgM (Immunoglobulin M), Srm: 32 mg/dL (ref 20–172)
M Protein SerPl Elph-Mcnc: 0.2 g/dL — ABNORMAL HIGH
Total Protein ELP: 6.3 g/dL (ref 6.0–8.5)

## 2022-01-01 ENCOUNTER — Inpatient Hospital Stay: Payer: No Typology Code available for payment source | Attending: Internal Medicine | Admitting: Hematology and Oncology

## 2022-01-01 ENCOUNTER — Other Ambulatory Visit: Payer: Self-pay

## 2022-01-01 ENCOUNTER — Inpatient Hospital Stay: Payer: No Typology Code available for payment source

## 2022-01-01 ENCOUNTER — Encounter: Payer: Self-pay | Admitting: Hematology and Oncology

## 2022-01-01 VITALS — BP 155/99 | HR 80 | Temp 98.4°F | Resp 18 | Ht 74.0 in | Wt 255.0 lb

## 2022-01-01 DIAGNOSIS — C9002 Multiple myeloma in relapse: Secondary | ICD-10-CM | POA: Diagnosis not present

## 2022-01-01 DIAGNOSIS — Z79899 Other long term (current) drug therapy: Secondary | ICD-10-CM | POA: Insufficient documentation

## 2022-01-01 DIAGNOSIS — S22000A Wedge compression fracture of unspecified thoracic vertebra, initial encounter for closed fracture: Secondary | ICD-10-CM

## 2022-01-01 DIAGNOSIS — Z5112 Encounter for antineoplastic immunotherapy: Secondary | ICD-10-CM | POA: Diagnosis present

## 2022-01-01 LAB — CBC WITH DIFFERENTIAL (CANCER CENTER ONLY)
Abs Immature Granulocytes: 0 10*3/uL (ref 0.00–0.07)
Basophils Absolute: 0.1 10*3/uL (ref 0.0–0.1)
Basophils Relative: 1 %
Eosinophils Absolute: 0.2 10*3/uL (ref 0.0–0.5)
Eosinophils Relative: 5 %
HCT: 37.2 % — ABNORMAL LOW (ref 39.0–52.0)
Hemoglobin: 13.1 g/dL (ref 13.0–17.0)
Immature Granulocytes: 0 %
Lymphocytes Relative: 37 %
Lymphs Abs: 1.5 10*3/uL (ref 0.7–4.0)
MCH: 31.2 pg (ref 26.0–34.0)
MCHC: 35.2 g/dL (ref 30.0–36.0)
MCV: 88.6 fL (ref 80.0–100.0)
Monocytes Absolute: 0.2 10*3/uL (ref 0.1–1.0)
Monocytes Relative: 6 %
Neutro Abs: 2 10*3/uL (ref 1.7–7.7)
Neutrophils Relative %: 51 %
Platelet Count: 165 10*3/uL (ref 150–400)
RBC: 4.2 MIL/uL — ABNORMAL LOW (ref 4.22–5.81)
RDW: 12.7 % (ref 11.5–15.5)
WBC Count: 4 10*3/uL (ref 4.0–10.5)
nRBC: 0 % (ref 0.0–0.2)

## 2022-01-01 LAB — CMP (CANCER CENTER ONLY)
ALT: 22 U/L (ref 0–44)
AST: 17 U/L (ref 15–41)
Albumin: 3.9 g/dL (ref 3.5–5.0)
Alkaline Phosphatase: 33 U/L — ABNORMAL LOW (ref 38–126)
Anion gap: 7 (ref 5–15)
BUN: 13 mg/dL (ref 6–20)
CO2: 24 mmol/L (ref 22–32)
Calcium: 9.1 mg/dL (ref 8.9–10.3)
Chloride: 105 mmol/L (ref 98–111)
Creatinine: 1.02 mg/dL (ref 0.61–1.24)
GFR, Estimated: >60 mL/min (ref >60)
Glucose, Bld: 134 mg/dL — ABNORMAL HIGH (ref 70–99)
Potassium: 3.5 mmol/L (ref 3.5–5.1)
Sodium: 136 mmol/L (ref 135–145)
Total Bilirubin: 0.5 mg/dL (ref 0.3–1.2)
Total Protein: 6.1 g/dL — ABNORMAL LOW (ref 6.5–8.1)

## 2022-01-01 MED ORDER — FAMOTIDINE 20 MG PO TABS
40.0000 mg | ORAL_TABLET | Freq: Once | ORAL | Status: AC
  Administered 2022-01-01: 40 mg via ORAL
  Filled 2022-01-01: qty 2

## 2022-01-01 MED ORDER — DARATUMUMAB-HYALURONIDASE-FIHJ 1800-30000 MG-UT/15ML ~~LOC~~ SOLN
1800.0000 mg | Freq: Once | SUBCUTANEOUS | Status: AC
  Administered 2022-01-01: 1800 mg via SUBCUTANEOUS
  Filled 2022-01-01: qty 15

## 2022-01-01 MED ORDER — ACETAMINOPHEN 325 MG PO TABS
650.0000 mg | ORAL_TABLET | Freq: Once | ORAL | Status: AC
  Administered 2022-01-01: 650 mg via ORAL
  Filled 2022-01-01: qty 2

## 2022-01-01 MED ORDER — DIPHENHYDRAMINE HCL 25 MG PO CAPS
25.0000 mg | ORAL_CAPSULE | Freq: Once | ORAL | Status: AC
  Administered 2022-01-01: 25 mg via ORAL
  Filled 2022-01-01: qty 1

## 2022-01-01 NOTE — Assessment & Plan Note (Signed)
He tolerated subcutaneous daratumumab well with current list of premeds He will continue the same I plan to repeat myeloma panel monthly He is reminded to take acyclovir for antimicrobial prophylaxis along with calcium and vitamin D Due to inability to get dental clearance, I have not prescribed Zometa We discussed importance of getting dental clearance before Zometa Once I am able to document good response to therapy, we can taper dexamethasone

## 2022-01-01 NOTE — Assessment & Plan Note (Signed)
He has new compression fractures noted on the vertebral body at T3 and T4 level as well as disc problems in the lumbar region However, he is not symptomatic He is taking calcium with vitamin D As above, I will prescribe Zometa once we have obtained dental clearance

## 2022-01-01 NOTE — Progress Notes (Signed)
[facility] OFFICE PROGRESS NOTE  Patient Care Team: [name], MD as PCP - General (Internal Medicine)  ASSESSMENT & PLAN:  Multiple myeloma in relapse [facility]) He tolerated subcutaneous daratumumab well with current list of premeds He will continue the same I plan to repeat myeloma panel monthly He is reminded to take acyclovir for antimicrobial prophylaxis along with calcium and vitamin D Due to inability to get dental clearance, I have not prescribed Zometa We discussed importance of getting dental clearance before Zometa Once I am able to document good response to therapy, we can taper dexamethasone  Compression fracture of body of thoracic vertebra ([place]) He has new compression fractures noted on the vertebral body at T3 and T4 level as well as disc problems in the lumbar region However, he is not symptomatic He is taking calcium with vitamin D As above, I will prescribe Zometa once we have obtained dental clearance  No orders of the defined types were placed in this encounter.   All questions were answered. The patient knows to call the clinic with any problems, questions or concerns. The total time spent in the appointment was 20 minutes encounter with patients including review of chart and various tests results, discussions about plan of care and coordination of care plan   [name], MD 01/01/2022 8:40 AM  INTERVAL HISTORY: Please see below for problem oriented charting. he returns for treatment follow-up on combination of dexamethasone, Pomalyst and daratumumab He has not obtained dental clearance yet He denies new back pain No recent allergic reactions  REVIEW OF SYSTEMS:   Constitutional: Denies fevers, chills or abnormal weight loss Eyes: Denies blurriness of vision Ears, nose, mouth, throat, and face: Denies mucositis or sore throat Respiratory: Denies cough, dyspnea or wheezes Cardiovascular: Denies palpitation, chest discomfort or lower  extremity swelling Gastrointestinal:  Denies nausea, heartburn or change in bowel habits Skin: Denies abnormal skin rashes Lymphatics: Denies new lymphadenopathy or easy bruising Neurological:Denies numbness, tingling or new weaknesses Behavioral/Psych: Mood is stable, no new changes  All other systems were reviewed with the patient and are negative.  I have reviewed the past medical history, past surgical history, social history and family history with the patient and they are unchanged from previous note.  ALLERGIES:  is allergic to daratumumab and heparin.  MEDICATIONS:  Current Outpatient Medications  Medication Sig Dispense Refill   acyclovir (ZOVIRAX) 400 MG tablet Take 1 tablet (400 mg total) by mouth 2 (two) times daily. 60 tablet 11   aspirin EC 81 MG tablet Take 81 mg by mouth daily. Swallow whole.     calcium carbonate (TUMS - DOSED IN MG ELEMENTAL CALCIUM) 500 MG chewable tablet Chew 1 tablet by mouth 3 (three) times daily.     cholecalciferol (VITAMIN D3) 25 MCG (1000 UT) tablet Take 1,000 Units by mouth daily.     dexamethasone (DECADRON) 4 MG tablet Take 5 tablets (20 mg total) by mouth once a week. Take weekly on Tuesday mornings with food 20 tablet 11   ondansetron (ZOFRAN) 8 MG tablet Take 1 tablet (8 mg total) by mouth every 8 (eight) hours as needed for nausea or vomiting. 30 tablet 1   pomalidomide (POMALYST) 3 MG capsule Take 1 capsule (3 mg total) by mouth daily. Take for 21 days on, 7 days off, repeat every 28 days 21 capsule 0   prochlorperazine (COMPAZINE) 10 MG tablet Take 1 tablet (10 mg total) by mouth every 6 (six) hours as needed for nausea  or vomiting. 30 tablet 1   No current facility-administered medications for this visit.   Facility-Administered Medications Ordered in Other Visits  Medication Dose Route Frequency Provider Last Rate Last Admin   daratumumab-hyaluronidase-fihj (DARZALEX FASPRO) 1800-30000 MG-UT/15ML chemo SQ injection 1,800 mg  1,800 mg  Subcutaneous Once [name], MD        SUMMARY OF ONCOLOGIC HISTORY: Oncology History  Multiple myeloma in relapse ([place])  06/23/2015 - 06/28/2015 Hospital Admission   The patient was admitted to the hospital due to gait ataxia and back pain. He was subsequently found to have cord compression underwent surgery and was discharged home   06/24/2015 Imaging   Abnormal appearance of the T6 vertebral body, highly suspicious for possible osseous metastasis. Associated pathologic fracture withup to 30% height loss. There is associated abnormal soft tissue density within the ventral epidural space,   06/24/2015 Imaging   MRI lumbar: Focal osseous lesion with abnormal enhancement involving the right pedicle of L3, suspicious for possible osseous metastasisgiven the findings in the thoracic spine. Question additional focal lesion within the right iliac wing as above.     06/25/2015 Pathology Results   Accession: [id number] bone biopsy come from plasma cell neoplasm.   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 Zometa is not given due to inability to get dental clearance   12/14/2015 - 04/24/2016 Chemotherapy   He is started on maintenance treatment with Revlimid only   12/18/2015 Imaging   MRI thoracic and lumbar spine showed numerous enhancing foci throughout the thoracic and lumbar spine with several new small foci in the lumbar spine in comparison with prior MRI compatible with metastatic disease. Stable loss of height of the T3, T4, and T6 vertebral bodies and new postsurgical changes related to T6 laminectomy. No significant epidural disease or evidence for cord compression. No abnormal enhancement of the spinal  cord or cauda equina.   05/02/2016 Bone Marrow Biopsy   Outside bone marrow biopsy showed 20% myeloma involvement   05/16/2016 Procedure   Successful placement of a right internal jugular approach power injectable Port-A-Cath. The catheter is ready for immediate use.   05/21/2016 - 07/03/2016 Chemotherapy   He received Kyprolis, Cytoxan and dexamethasone    07/08/2016 Procedure   Status post CT-guided bone marrow biopsy, with tissue specimen sent to pathology for complete histopathologic analysis   07/08/2016 Bone Marrow Biopsy   Bone Marrow, Aspirate,Biopsy, and Clot BONE MARROW: - MILDLY HYPERCELLULAR MARROW (60%) WITH PLASMA CELL NEOPLASM - SEE COMMENT PERIPHERAL BLOOD: - NORMOCYTIC ANEMIA Diagnosis Note The marrow is hypercellular with lambda-restricted plasma cells consistent with persistence of the patient's previously diagnosed plasma cell neoplasm. The plasma cells comprise approximately 10-15% of the total marrow cellularity, are enlarged, and arranged in clusters.   08/14/2016 Miscellaneous   He received conditioning treatment with melphalan   08/15/2016 Bone Marrow Transplant   He received autologous stem cell transplant   08/24/2016 - 08/29/2016 Hospital Admission   His post-transplant course was complicated by E-Coli bacteremia   11/26/2016 PET scan   PET CT at [facility] 1. Technically limited study due to soft tissue uptake. 2. New hypermetabolic uptake at C7 spinous process and left proximal femur that is of questionable significance in absence of underlying  CT correlate. Further assessment with whole body bone scan may be considered. 2. Redemonstrated nonhypermetabolic multifocal lucent and sclerotic lesions throughout the spine which are similar to prior.    12/02/2016 Bone Marrow Biopsy   He had repeat bone marrow biopsy at [facility] An immunohistochemical stain for CD138 is performed on the bone marrow core biopsy demonstrates increased plasma cells with focal clustering  (10-20% overall), which are monotypic for lambda light chain by in situ hybridization.   01/06/2017 - 06/09/2017 Chemotherapy   He received weekly Dexamethasone, Pomalyst days 1-21 and Daratumumab. From 06/08/17 onwards, he is placed on maintenance Pomalyst only.  He self discontinue Pomalyst in February 2021   07/23/2017 Procedure   Successful right IJ vein Port-A-Cath explant.   03/22/2021 Imaging   1. New large disc protrusion at L4-5 with severe spinal stenosis. 2. History of multiple myeloma with largely resolved diffuse bone marrow heterogeneity since 2017. New enhancing lesions in the L4 and L5 vertebral bodies without acute fracture. 3. Chronic thoracic compression fractures including severe T6 vertebral body height loss which has progressed from 2017. Resolved epidural tumor.     11/26/2021 PET scan   1. Hypermetabolic patchy lytic L5 vertebral lesion compatible with metabolically active multiple myeloma. 2. No additional sites of hypermetabolic skeletal or extraskeletal myeloma. 3. Mild colonic diverticulosis.   11/27/2021 - 11/27/2021 Chemotherapy   Patient is on Treatment Plan : MYELOMA Daratumumab IV + Pomalidomide + Dexamethasone q28d x 7 cycles     11/27/2021 -  Chemotherapy   Patient is on Treatment Plan : MYELOMA RELAPSED REFRACTORY Daratumumab SQ + Pomalidomide + Dexamethasone (DaraPd) q28d       PHYSICAL EXAMINATION: ECOG PERFORMANCE STATUS: 0 - Asymptomatic  Vitals:   01/01/22 0803  BP: (!) 155/99  Pulse: 80  Resp: 18  Temp: 98.4 F (36.9 C)  SpO2: 99%   Filed Weights   01/01/22 0803  Weight: 255 lb (115.7 kg)    GENERAL:alert, no distress and comfortable  NEURO: alert & oriented x 3 with fluent speech, no focal motor/sensory deficits  LABORATORY DATA:  I have reviewed the data as listed    Component Value Date/Time   NA 138 12/25/2021 0828   NA 139 01/27/2017 0809   K 4.0 12/25/2021 0828   K 4.1 01/27/2017 0809   CL 104 12/25/2021 0828   CO2  28 12/25/2021 0828   CO2 23 01/27/2017 0809   GLUCOSE 112 (H) 12/25/2021 0828   GLUCOSE 122 01/27/2017 0809   BUN 23 (H) 12/25/2021 0828   BUN 14.3 01/27/2017 0809   CREATININE 1.20 12/25/2021 0828   CREATININE 0.9 01/27/2017 0809   CALCIUM 10.0 12/25/2021 0828   CALCIUM 9.1 01/27/2017 0809   PROT 6.9 12/25/2021 0828   PROT 6.2 (L) 01/27/2017 0809   ALBUMIN 4.5 12/25/2021 0828   ALBUMIN 3.7 01/27/2017 0809   AST 15 12/25/2021 0828   AST 10 01/27/2017 0809   ALT 21 12/25/2021 0828   ALT 18 01/27/2017 0809   ALKPHOS 42 12/25/2021 0828   ALKPHOS 42 01/27/2017 0809   BILITOT 0.6 12/25/2021 0828   BILITOT 0.72 01/27/2017 0809   GFRNONAA >60 12/25/2021 0828   GFRAA >60 10/18/2019 1017    No results found for: "SPEP", "UPEP"  Lab Results  Component Value Date   WBC 4.0 01/01/2022   NEUTROABS 2.0 01/01/2022   HGB 13.1 01/01/2022   HCT 37.2 (L) 01/01/2022   MCV 88.6 01/01/2022   PLT 165 01/01/2022  Chemistry      Component Value Date/Time   NA 138 12/25/2021 0828   NA 139 01/27/2017 0809   K 4.0 12/25/2021 0828   K 4.1 01/27/2017 0809   CL 104 12/25/2021 0828   CO2 28 12/25/2021 0828   CO2 23 01/27/2017 0809   BUN 23 (H) 12/25/2021 0828   BUN 14.3 01/27/2017 0809   CREATININE 1.20 12/25/2021 0828   CREATININE 0.9 01/27/2017 0809      Component Value Date/Time   CALCIUM 10.0 12/25/2021 0828   CALCIUM 9.1 01/27/2017 0809   ALKPHOS 42 12/25/2021 0828   ALKPHOS 42 01/27/2017 0809   AST 15 12/25/2021 0828   AST 10 01/27/2017 0809   ALT 21 12/25/2021 0828   ALT 18 01/27/2017 0809   BILITOT 0.6 12/25/2021 0828   BILITOT 0.72 01/27/2017 0809

## 2022-01-01 NOTE — Patient Instructions (Signed)
[facility] ONCOLOGY  Discharge Instructions: Thank you for choosing [facility] to provide your oncology and hematology care.   If you have a lab appointment with the [facility], please go directly to the [facility] and check in at the registration area.   Wear comfortable clothing and clothing appropriate for easy access to any Portacath or PICC line.   We strive to give you quality time with your provider. You may need to reschedule your appointment if you arrive late (15 or more minutes).  Arriving late affects you and other patients whose appointments are after yours.  Also, if you miss three or more appointments without notifying the office, you may be dismissed from the clinic at the provider's discretion.      For prescription refill requests, have your pharmacy contact our office and allow 72 hours for refills to be completed.    Today you received the following chemotherapy and/or immunotherapy agents: Darzalex Faspro      To help prevent nausea and vomiting after your treatment, we encourage you to take your nausea medication as directed.  BELOW ARE SYMPTOMS THAT SHOULD BE REPORTED IMMEDIATELY: *FEVER GREATER THAN 100.4 F (38 C) OR HIGHER *CHILLS OR SWEATING *NAUSEA AND VOMITING THAT IS NOT CONTROLLED WITH YOUR NAUSEA MEDICATION *UNUSUAL SHORTNESS OF BREATH *UNUSUAL BRUISING OR BLEEDING *URINARY PROBLEMS (pain or burning when urinating, or frequent urination) *BOWEL PROBLEMS (unusual diarrhea, constipation, pain near the anus) TENDERNESS IN MOUTH AND THROAT WITH OR WITHOUT PRESENCE OF ULCERS (sore throat, sores in mouth, or a toothache) UNUSUAL RASH, SWELLING OR PAIN  UNUSUAL VAGINAL DISCHARGE OR ITCHING   Items with * indicate a potential emergency and should be followed up as soon as possible or go to the Emergency Department if any problems should occur.  Please show the CHEMOTHERAPY ALERT CARD or IMMUNOTHERAPY ALERT CARD at  check-in to the Emergency Department and triage nurse.  Should you have questions after your visit or need to cancel or reschedule your appointment, please contact [facility]  Dept: [phone]  and follow the prompts.  Office hours are 8:00 a.m. to 4:30 p.m. Monday - Friday. Please note that voicemails left after 4:00 p.m. may not be returned until the following business day.  We are closed weekends and major holidays. You have access to a nurse at all times for urgent questions. Please call the main number to the clinic Dept: [phone] and follow the prompts.   For any non-urgent questions, you may also contact your provider using MyChart. We now offer e-Visits for anyone 52 and older to request care online for non-urgent symptoms. For details visit [email or web address].   Also download the MyChart app! Go to the app store, search "MyChart", open the app, select [facility], and log in with your MyChart username and password.  Masks are optional in the cancer centers. If you would like for your care team to wear a mask while they are taking care of you, please let them know. You may have one support person who is at least 52 years old accompany you for your appointments.

## 2022-01-01 NOTE — Progress Notes (Signed)
Pt informed this RN that he took 20 mg dexamethasone PO at home prior to infusion appointment today.

## 2022-01-02 ENCOUNTER — Other Ambulatory Visit: Payer: Self-pay

## 2022-01-02 ENCOUNTER — Other Ambulatory Visit: Payer: Self-pay | Admitting: Hematology and Oncology

## 2022-01-02 ENCOUNTER — Encounter: Payer: Self-pay | Admitting: Hematology and Oncology

## 2022-01-02 DIAGNOSIS — C9002 Multiple myeloma in relapse: Secondary | ICD-10-CM

## 2022-01-03 NOTE — Progress Notes (Signed)
The patient has been taking dexamethasone at home prior to daratumumab injections. After discussion with the provider, the offset time of daratumumab was changed to 30 minutes in order to reduce patient wait time, starting 01/08/2022. A calendar was not sent to the patient because he has already been taking dexamethasone at home.  Laray Anger, PharmD PGY-2 Pharmacy Resident Hematology/Oncology [phone]  01/03/2022 11:00 AM

## 2022-01-06 ENCOUNTER — Other Ambulatory Visit: Payer: Self-pay

## 2022-01-08 ENCOUNTER — Other Ambulatory Visit: Payer: Self-pay | Admitting: Hematology and Oncology

## 2022-01-08 ENCOUNTER — Other Ambulatory Visit: Payer: Self-pay

## 2022-01-08 ENCOUNTER — Inpatient Hospital Stay: Payer: No Typology Code available for payment source

## 2022-01-08 VITALS — BP 141/93 | HR 80 | Temp 98.5°F | Resp 18 | Wt 254.2 lb

## 2022-01-08 DIAGNOSIS — C9002 Multiple myeloma in relapse: Secondary | ICD-10-CM

## 2022-01-08 DIAGNOSIS — Z5112 Encounter for antineoplastic immunotherapy: Secondary | ICD-10-CM | POA: Diagnosis not present

## 2022-01-08 LAB — CMP (CANCER CENTER ONLY)
ALT: 20 U/L (ref 0–44)
AST: 15 U/L (ref 15–41)
Albumin: 3.9 g/dL (ref 3.5–5.0)
Alkaline Phosphatase: 44 U/L (ref 38–126)
Anion gap: 5 (ref 5–15)
BUN: 19 mg/dL (ref 6–20)
CO2: 27 mmol/L (ref 22–32)
Calcium: 9.4 mg/dL (ref 8.9–10.3)
Chloride: 104 mmol/L (ref 98–111)
Creatinine: 1.15 mg/dL (ref 0.61–1.24)
GFR, Estimated: >60 mL/min (ref >60)
Glucose, Bld: 106 mg/dL — ABNORMAL HIGH (ref 70–99)
Potassium: 3.9 mmol/L (ref 3.5–5.1)
Sodium: 136 mmol/L (ref 135–145)
Total Bilirubin: 0.4 mg/dL (ref 0.3–1.2)
Total Protein: 5.8 g/dL — ABNORMAL LOW (ref 6.5–8.1)

## 2022-01-08 LAB — CBC WITH DIFFERENTIAL (CANCER CENTER ONLY)
Abs Immature Granulocytes: 0.01 10*3/uL (ref 0.00–0.07)
Basophils Absolute: 0 10*3/uL (ref 0.0–0.1)
Basophils Relative: 1 %
Eosinophils Absolute: 0.4 10*3/uL (ref 0.0–0.5)
Eosinophils Relative: 7 %
HCT: 38 % — ABNORMAL LOW (ref 39.0–52.0)
Hemoglobin: 13.5 g/dL (ref 13.0–17.0)
Immature Granulocytes: 0 %
Lymphocytes Relative: 38 %
Lymphs Abs: 2.2 10*3/uL (ref 0.7–4.0)
MCH: 32.1 pg (ref 26.0–34.0)
MCHC: 35.5 g/dL (ref 30.0–36.0)
MCV: 90.5 fL (ref 80.0–100.0)
Monocytes Absolute: 0.8 10*3/uL (ref 0.1–1.0)
Monocytes Relative: 15 %
Neutro Abs: 2.3 10*3/uL (ref 1.7–7.7)
Neutrophils Relative %: 39 %
Platelet Count: 170 10*3/uL (ref 150–400)
RBC: 4.2 MIL/uL — ABNORMAL LOW (ref 4.22–5.81)
RDW: 12.7 % (ref 11.5–15.5)
WBC Count: 5.7 10*3/uL (ref 4.0–10.5)
nRBC: 0 % (ref 0.0–0.2)

## 2022-01-08 MED ORDER — FAMOTIDINE 20 MG PO TABS
40.0000 mg | ORAL_TABLET | Freq: Once | ORAL | Status: AC
  Administered 2022-01-08: 40 mg via ORAL
  Filled 2022-01-08: qty 2

## 2022-01-08 MED ORDER — ACETAMINOPHEN 325 MG PO TABS
650.0000 mg | ORAL_TABLET | Freq: Once | ORAL | Status: AC
  Administered 2022-01-08: 650 mg via ORAL
  Filled 2022-01-08: qty 2

## 2022-01-08 MED ORDER — DIPHENHYDRAMINE HCL 25 MG PO CAPS
25.0000 mg | ORAL_CAPSULE | Freq: Once | ORAL | Status: AC
  Administered 2022-01-08: 25 mg via ORAL
  Filled 2022-01-08: qty 1

## 2022-01-08 MED ORDER — DARATUMUMAB-HYALURONIDASE-FIHJ 1800-30000 MG-UT/15ML ~~LOC~~ SOLN
1800.0000 mg | Freq: Once | SUBCUTANEOUS | Status: AC
  Administered 2022-01-08: 1800 mg via SUBCUTANEOUS
  Filled 2022-01-08: qty 15

## 2022-01-08 NOTE — Patient Instructions (Signed)
[facility] ONCOLOGY  Discharge Instructions: Thank you for choosing [facility] to provide your oncology and hematology care.   If you have a lab appointment with the [facility], please go directly to the [facility] and check in at the registration area.   Wear comfortable clothing and clothing appropriate for easy access to any Portacath or PICC line.   We strive to give you quality time with your provider. You may need to reschedule your appointment if you arrive late (15 or more minutes).  Arriving late affects you and other patients whose appointments are after yours.  Also, if you miss three or more appointments without notifying the office, you may be dismissed from the clinic at the provider's discretion.      For prescription refill requests, have your pharmacy contact our office and allow 72 hours for refills to be completed.    Today you received the following chemotherapy and/or immunotherapy agents: Darzalex Faspro      To help prevent nausea and vomiting after your treatment, we encourage you to take your nausea medication as directed.  BELOW ARE SYMPTOMS THAT SHOULD BE REPORTED IMMEDIATELY: *FEVER GREATER THAN 100.4 F (38 C) OR HIGHER *CHILLS OR SWEATING *NAUSEA AND VOMITING THAT IS NOT CONTROLLED WITH YOUR NAUSEA MEDICATION *UNUSUAL SHORTNESS OF BREATH *UNUSUAL BRUISING OR BLEEDING *URINARY PROBLEMS (pain or burning when urinating, or frequent urination) *BOWEL PROBLEMS (unusual diarrhea, constipation, pain near the anus) TENDERNESS IN MOUTH AND THROAT WITH OR WITHOUT PRESENCE OF ULCERS (sore throat, sores in mouth, or a toothache) UNUSUAL RASH, SWELLING OR PAIN  UNUSUAL VAGINAL DISCHARGE OR ITCHING   Items with * indicate a potential emergency and should be followed up as soon as possible or go to the Emergency Department if any problems should occur.  Please show the CHEMOTHERAPY ALERT CARD or IMMUNOTHERAPY ALERT CARD at  check-in to the Emergency Department and triage nurse.  Should you have questions after your visit or need to cancel or reschedule your appointment, please contact [facility]  Dept: [phone]  and follow the prompts.  Office hours are 8:00 a.m. to 4:30 p.m. Monday - Friday. Please note that voicemails left after 4:00 p.m. may not be returned until the following business day.  We are closed weekends and major holidays. You have access to a nurse at all times for urgent questions. Please call the main number to the clinic Dept: [phone] and follow the prompts.   For any non-urgent questions, you may also contact your provider using MyChart. We now offer e-Visits for anyone 92 and older to request care online for non-urgent symptoms. For details visit [email or web address].   Also download the MyChart app! Go to the app store, search "MyChart", open the app, select [facility], and log in with your MyChart username and password.  Masks are optional in the cancer centers. If you would like for your care team to wear a mask while they are taking care of you, please let them know. You may have one support person who is at least 52 years old accompany you for your appointments.

## 2022-01-08 NOTE — Progress Notes (Signed)
Patient took his dexamethasone at home today about 0700.  Patient's issue with SOB was discussed with Dr. Alvy Bimler- between the MD and the patient's wishes it was decided to continue with treatment.

## 2022-01-09 ENCOUNTER — Other Ambulatory Visit: Payer: Self-pay

## 2022-01-10 ENCOUNTER — Other Ambulatory Visit: Payer: Self-pay

## 2022-01-15 ENCOUNTER — Other Ambulatory Visit: Payer: Self-pay

## 2022-01-15 ENCOUNTER — Inpatient Hospital Stay: Payer: No Typology Code available for payment source

## 2022-01-15 VITALS — BP 152/89 | HR 79 | Temp 97.9°F | Resp 18 | Wt 254.8 lb

## 2022-01-15 DIAGNOSIS — C9002 Multiple myeloma in relapse: Secondary | ICD-10-CM

## 2022-01-15 DIAGNOSIS — Z5112 Encounter for antineoplastic immunotherapy: Secondary | ICD-10-CM | POA: Diagnosis not present

## 2022-01-15 LAB — CMP (CANCER CENTER ONLY)
ALT: 21 U/L (ref 0–44)
AST: 17 U/L (ref 15–41)
Albumin: 3.9 g/dL (ref 3.5–5.0)
Alkaline Phosphatase: 39 U/L (ref 38–126)
Anion gap: 8 (ref 5–15)
BUN: 11 mg/dL (ref 6–20)
CO2: 26 mmol/L (ref 22–32)
Calcium: 9.3 mg/dL (ref 8.9–10.3)
Chloride: 106 mmol/L (ref 98–111)
Creatinine: 0.96 mg/dL (ref 0.61–1.24)
GFR, Estimated: >60 mL/min (ref >60)
Glucose, Bld: 105 mg/dL — ABNORMAL HIGH (ref 70–99)
Potassium: 3.9 mmol/L (ref 3.5–5.1)
Sodium: 140 mmol/L (ref 135–145)
Total Bilirubin: 0.7 mg/dL (ref 0.3–1.2)
Total Protein: 6.2 g/dL — ABNORMAL LOW (ref 6.5–8.1)

## 2022-01-15 LAB — CBC WITH DIFFERENTIAL (CANCER CENTER ONLY)
Abs Immature Granulocytes: 0 10*3/uL (ref 0.00–0.07)
Basophils Absolute: 0.1 10*3/uL (ref 0.0–0.1)
Basophils Relative: 2 %
Eosinophils Absolute: 0.4 10*3/uL (ref 0.0–0.5)
Eosinophils Relative: 10 %
HCT: 37.5 % — ABNORMAL LOW (ref 39.0–52.0)
Hemoglobin: 13.2 g/dL (ref 13.0–17.0)
Immature Granulocytes: 0 %
Lymphocytes Relative: 39 %
Lymphs Abs: 1.6 10*3/uL (ref 0.7–4.0)
MCH: 31.4 pg (ref 26.0–34.0)
MCHC: 35.2 g/dL (ref 30.0–36.0)
MCV: 89.1 fL (ref 80.0–100.0)
Monocytes Absolute: 0.5 10*3/uL (ref 0.1–1.0)
Monocytes Relative: 13 %
Neutro Abs: 1.5 10*3/uL — ABNORMAL LOW (ref 1.7–7.7)
Neutrophils Relative %: 36 %
Platelet Count: 152 10*3/uL (ref 150–400)
RBC: 4.21 MIL/uL — ABNORMAL LOW (ref 4.22–5.81)
RDW: 13 % (ref 11.5–15.5)
WBC Count: 4.1 10*3/uL (ref 4.0–10.5)
nRBC: 0 % (ref 0.0–0.2)

## 2022-01-15 MED ORDER — ACETAMINOPHEN 325 MG PO TABS
650.0000 mg | ORAL_TABLET | Freq: Once | ORAL | Status: AC
  Administered 2022-01-15: 650 mg via ORAL
  Filled 2022-01-15: qty 2

## 2022-01-15 MED ORDER — DARATUMUMAB-HYALURONIDASE-FIHJ 1800-30000 MG-UT/15ML ~~LOC~~ SOLN
1800.0000 mg | Freq: Once | SUBCUTANEOUS | Status: AC
  Administered 2022-01-15: 1800 mg via SUBCUTANEOUS
  Filled 2022-01-15: qty 15

## 2022-01-15 MED ORDER — POMALIDOMIDE 3 MG PO CAPS: 3.0000 mg | ORAL_CAPSULE | Freq: Every day | ORAL | 0 refills | Status: DC

## 2022-01-15 MED ORDER — FAMOTIDINE 20 MG PO TABS
40.0000 mg | ORAL_TABLET | Freq: Once | ORAL | Status: AC
  Administered 2022-01-15: 40 mg via ORAL
  Filled 2022-01-15: qty 2

## 2022-01-15 MED ORDER — DIPHENHYDRAMINE HCL 25 MG PO CAPS
25.0000 mg | ORAL_CAPSULE | Freq: Once | ORAL | Status: AC
  Administered 2022-01-15: 25 mg via ORAL
  Filled 2022-01-15: qty 1

## 2022-01-15 NOTE — Progress Notes (Signed)
Pt states he took decadron at home today prior to arriving for injection.

## 2022-01-15 NOTE — Patient Instructions (Signed)
[facility] ONCOLOGY  Discharge Instructions: Thank you for choosing [facility] to provide your oncology and hematology care.   If you have a lab appointment with the [facility], please go directly to the [facility] and check in at the registration area.   Wear comfortable clothing and clothing appropriate for easy access to any Portacath or PICC line.   We strive to give you quality time with your provider. You may need to reschedule your appointment if you arrive late (15 or more minutes).  Arriving late affects you and other patients whose appointments are after yours.  Also, if you miss three or more appointments without notifying the office, you may be dismissed from the clinic at the provider's discretion.      For prescription refill requests, have your pharmacy contact our office and allow 72 hours for refills to be completed.    Today you received the following chemotherapy and/or immunotherapy agents: Dara Faspro.      To help prevent nausea and vomiting after your treatment, we encourage you to take your nausea medication as directed.  BELOW ARE SYMPTOMS THAT SHOULD BE REPORTED IMMEDIATELY: *FEVER GREATER THAN 100.4 F (38 C) OR HIGHER *CHILLS OR SWEATING *NAUSEA AND VOMITING THAT IS NOT CONTROLLED WITH YOUR NAUSEA MEDICATION *UNUSUAL SHORTNESS OF BREATH *UNUSUAL BRUISING OR BLEEDING *URINARY PROBLEMS (pain or burning when urinating, or frequent urination) *BOWEL PROBLEMS (unusual diarrhea, constipation, pain near the anus) TENDERNESS IN MOUTH AND THROAT WITH OR WITHOUT PRESENCE OF ULCERS (sore throat, sores in mouth, or a toothache) UNUSUAL RASH, SWELLING OR PAIN  UNUSUAL VAGINAL DISCHARGE OR ITCHING   Items with * indicate a potential emergency and should be followed up as soon as possible or go to the Emergency Department if any problems should occur.  Please show the CHEMOTHERAPY ALERT CARD or IMMUNOTHERAPY ALERT CARD at check-in  to the Emergency Department and triage nurse.  Should you have questions after your visit or need to cancel or reschedule your appointment, please contact [facility]  Dept: [phone]  and follow the prompts.  Office hours are 8:00 a.m. to 4:30 p.m. Monday - Friday. Please note that voicemails left after 4:00 p.m. may not be returned until the following business day.  We are closed weekends and major holidays. You have access to a nurse at all times for urgent questions. Please call the main number to the clinic Dept: [phone] and follow the prompts.   For any non-urgent questions, you may also contact your provider using MyChart. We now offer e-Visits for anyone 39 and older to request care online for non-urgent symptoms. For details visit [email or web address].   Also download the MyChart app! Go to the app store, search "MyChart", open the app, select [facility], and log in with your MyChart username and password.  Masks are optional in the cancer centers. If you would like for your care team to wear a mask while they are taking care of you, please let them know. You may have one support person who is at least 52 years old accompany you for your appointments.

## 2022-01-29 ENCOUNTER — Inpatient Hospital Stay: Payer: No Typology Code available for payment source

## 2022-01-29 ENCOUNTER — Inpatient Hospital Stay: Payer: No Typology Code available for payment source | Attending: Internal Medicine

## 2022-01-29 VITALS — BP 143/90 | HR 86 | Resp 17 | Wt 254.0 lb

## 2022-01-29 DIAGNOSIS — Z5112 Encounter for antineoplastic immunotherapy: Secondary | ICD-10-CM | POA: Diagnosis not present

## 2022-01-29 DIAGNOSIS — C9002 Multiple myeloma in relapse: Secondary | ICD-10-CM | POA: Diagnosis not present

## 2022-01-29 DIAGNOSIS — Z79899 Other long term (current) drug therapy: Secondary | ICD-10-CM | POA: Insufficient documentation

## 2022-01-29 LAB — CBC WITH DIFFERENTIAL (CANCER CENTER ONLY)
Abs Immature Granulocytes: 0.01 10*3/uL (ref 0.00–0.07)
Basophils Absolute: 0.1 10*3/uL (ref 0.0–0.1)
Basophils Relative: 1 %
Eosinophils Absolute: 0.1 10*3/uL (ref 0.0–0.5)
Eosinophils Relative: 3 %
HCT: 37 % — ABNORMAL LOW (ref 39.0–52.0)
Hemoglobin: 13.1 g/dL (ref 13.0–17.0)
Immature Granulocytes: 0 %
Lymphocytes Relative: 35 %
Lymphs Abs: 1.5 10*3/uL (ref 0.7–4.0)
MCH: 31.4 pg (ref 26.0–34.0)
MCHC: 35.4 g/dL (ref 30.0–36.0)
MCV: 88.7 fL (ref 80.0–100.0)
Monocytes Absolute: 0.4 10*3/uL (ref 0.1–1.0)
Monocytes Relative: 8 %
Neutro Abs: 2.3 10*3/uL (ref 1.7–7.7)
Neutrophils Relative %: 53 %
Platelet Count: 247 10*3/uL (ref 150–400)
RBC: 4.17 MIL/uL — ABNORMAL LOW (ref 4.22–5.81)
RDW: 12.7 % (ref 11.5–15.5)
WBC Count: 4.4 10*3/uL (ref 4.0–10.5)
nRBC: 0 % (ref 0.0–0.2)

## 2022-01-29 LAB — CMP (CANCER CENTER ONLY)
ALT: 18 U/L (ref 0–44)
AST: 14 U/L — ABNORMAL LOW (ref 15–41)
Albumin: 4.1 g/dL (ref 3.5–5.0)
Alkaline Phosphatase: 46 U/L (ref 38–126)
Anion gap: 6 (ref 5–15)
BUN: 16 mg/dL (ref 6–20)
CO2: 28 mmol/L (ref 22–32)
Calcium: 10 mg/dL (ref 8.9–10.3)
Chloride: 104 mmol/L (ref 98–111)
Creatinine: 0.89 mg/dL (ref 0.61–1.24)
GFR, Estimated: >60 mL/min (ref >60)
Glucose, Bld: 92 mg/dL (ref 70–99)
Potassium: 3.9 mmol/L (ref 3.5–5.1)
Sodium: 138 mmol/L (ref 135–145)
Total Bilirubin: 0.7 mg/dL (ref 0.3–1.2)
Total Protein: 6.7 g/dL (ref 6.5–8.1)

## 2022-01-29 MED ORDER — DARATUMUMAB-HYALURONIDASE-FIHJ 1800-30000 MG-UT/15ML ~~LOC~~ SOLN
1800.0000 mg | Freq: Once | SUBCUTANEOUS | Status: AC
  Administered 2022-01-29: 1800 mg via SUBCUTANEOUS
  Filled 2022-01-29: qty 15

## 2022-01-29 MED ORDER — ACETAMINOPHEN 325 MG PO TABS
650.0000 mg | ORAL_TABLET | Freq: Once | ORAL | Status: AC
  Administered 2022-01-29: 650 mg via ORAL
  Filled 2022-01-29: qty 2

## 2022-01-29 MED ORDER — DIPHENHYDRAMINE HCL 25 MG PO CAPS
25.0000 mg | ORAL_CAPSULE | Freq: Once | ORAL | Status: AC
  Administered 2022-01-29: 25 mg via ORAL
  Filled 2022-01-29: qty 1

## 2022-01-29 MED ORDER — FAMOTIDINE 20 MG PO TABS
40.0000 mg | ORAL_TABLET | Freq: Once | ORAL | Status: AC
  Administered 2022-01-29: 40 mg via ORAL
  Filled 2022-01-29: qty 2

## 2022-01-29 NOTE — Progress Notes (Signed)
Patient stated that he took his Dexamethasone at home today prior to appointment.

## 2022-01-29 NOTE — Patient Instructions (Signed)
[facility]  Discharge Instructions: Thank you for choosing [facility] to provide your oncology and hematology care.   If you have a lab appointment with the Cancer Center, please go directly to the Cancer Center and check in at the registration area.   Wear comfortable clothing and clothing appropriate for easy access to any Portacath or PICC line.   We strive to give you quality time with your provider. You may need to reschedule your appointment if you arrive late (15 or more minutes).  Arriving late affects you and other patients whose appointments are after yours.  Also, if you miss three or more appointments without notifying the office, you may be dismissed from the clinic at the provider's discretion.      For prescription refill requests, have your pharmacy contact our office and allow 72 hours for refills to be completed.    Today you received the following chemotherapy and/or immunotherapy agents: Darzalex Faspro      To help prevent nausea and vomiting after your treatment, we encourage you to take your nausea medication as directed.  BELOW ARE SYMPTOMS THAT SHOULD BE REPORTED IMMEDIATELY: *FEVER GREATER THAN 100.4 F (38 C) OR HIGHER *CHILLS OR SWEATING *NAUSEA AND VOMITING THAT IS NOT CONTROLLED WITH YOUR NAUSEA MEDICATION *UNUSUAL SHORTNESS OF BREATH *UNUSUAL BRUISING OR BLEEDING *URINARY PROBLEMS (pain or burning when urinating, or frequent urination) *BOWEL PROBLEMS (unusual diarrhea, constipation, pain near the anus) TENDERNESS IN MOUTH AND THROAT WITH OR WITHOUT PRESENCE OF ULCERS (sore throat, sores in mouth, or a toothache) UNUSUAL RASH, SWELLING OR PAIN  UNUSUAL VAGINAL DISCHARGE OR ITCHING   Items with * indicate a potential emergency and should be followed up as soon as possible or go to the Emergency Department if any problems should occur.  Please show the CHEMOTHERAPY ALERT CARD or IMMUNOTHERAPY ALERT CARD at  check-in to the Emergency Department and triage nurse.  Should you have questions after your visit or need to cancel or reschedule your appointment, please contact [facility]  Dept: [phone]  and follow the prompts.  Office hours are 8:00 a.m. to 4:30 p.m. Monday - Friday. Please note that voicemails left after 4:00 p.m. may not be returned until the following business day.  We are closed weekends and major holidays. You have access to a nurse at all times for urgent questions. Please call the main number to the clinic Dept: [phone] and follow the prompts.   For any non-urgent questions, you may also contact your provider using MyChart. We now offer e-Visits for anyone 18 and older to request care online for non-urgent symptoms. For details visit [email or web address].   Also download the MyChart app! Go to the app store, search "MyChart", open the app, select [facility], and log in with your MyChart username and password.   

## 2022-01-30 LAB — KAPPA/LAMBDA LIGHT CHAINS
Kappa free light chain: 6.9 mg/L (ref 3.3–19.4)
Kappa, lambda light chain ratio: 0.03 — ABNORMAL LOW (ref 0.26–1.65)
Lambda free light chains: 235.1 mg/L — ABNORMAL HIGH (ref 5.7–26.3)

## 2022-02-04 LAB — MULTIPLE MYELOMA PANEL, SERUM
Albumin SerPl Elph-Mcnc: 3.6 g/dL (ref 2.9–4.4)
Albumin/Glob SerPl: 1.4 (ref 0.7–1.7)
Alpha 1: 0.2 g/dL (ref 0.0–0.4)
Alpha2 Glob SerPl Elph-Mcnc: 0.9 g/dL (ref 0.4–1.0)
B-Globulin SerPl Elph-Mcnc: 1 g/dL (ref 0.7–1.3)
Gamma Glob SerPl Elph-Mcnc: 0.5 g/dL (ref 0.4–1.8)
Globulin, Total: 2.6 g/dL (ref 2.2–3.9)
IgA: 28 mg/dL — ABNORMAL LOW (ref 90–386)
IgG (Immunoglobin G), Serum: 652 mg/dL (ref 603–1613)
IgM (Immunoglobulin M), Srm: 40 mg/dL (ref 20–172)
M Protein SerPl Elph-Mcnc: 0.2 g/dL — ABNORMAL HIGH
Total Protein ELP: 6.2 g/dL (ref 6.0–8.5)

## 2022-02-12 ENCOUNTER — Inpatient Hospital Stay: Payer: No Typology Code available for payment source

## 2022-02-12 ENCOUNTER — Other Ambulatory Visit: Payer: Self-pay

## 2022-02-12 ENCOUNTER — Encounter: Payer: Self-pay | Admitting: Hematology and Oncology

## 2022-02-12 ENCOUNTER — Inpatient Hospital Stay (HOSPITAL_BASED_OUTPATIENT_CLINIC_OR_DEPARTMENT_OTHER): Payer: No Typology Code available for payment source | Admitting: Hematology and Oncology

## 2022-02-12 VITALS — BP 155/99 | HR 100 | Temp 98.2°F | Resp 18 | Ht 74.0 in | Wt 248.0 lb

## 2022-02-12 DIAGNOSIS — C9002 Multiple myeloma in relapse: Secondary | ICD-10-CM

## 2022-02-12 DIAGNOSIS — D630 Anemia in neoplastic disease: Secondary | ICD-10-CM | POA: Diagnosis not present

## 2022-02-12 DIAGNOSIS — Z5112 Encounter for antineoplastic immunotherapy: Secondary | ICD-10-CM | POA: Diagnosis not present

## 2022-02-12 LAB — CMP (CANCER CENTER ONLY)
ALT: 17 U/L (ref 0–44)
AST: 15 U/L (ref 15–41)
Albumin: 4 g/dL (ref 3.5–5.0)
Alkaline Phosphatase: 50 U/L (ref 38–126)
Anion gap: 6 (ref 5–15)
BUN: 17 mg/dL (ref 6–20)
CO2: 26 mmol/L (ref 22–32)
Calcium: 9.5 mg/dL (ref 8.9–10.3)
Chloride: 107 mmol/L (ref 98–111)
Creatinine: 1 mg/dL (ref 0.61–1.24)
GFR, Estimated: >60 mL/min (ref >60)
Glucose, Bld: 94 mg/dL (ref 70–99)
Potassium: 4 mmol/L (ref 3.5–5.1)
Sodium: 139 mmol/L (ref 135–145)
Total Bilirubin: 0.5 mg/dL (ref 0.3–1.2)
Total Protein: 6.7 g/dL (ref 6.5–8.1)

## 2022-02-12 LAB — CBC WITH DIFFERENTIAL (CANCER CENTER ONLY)
Abs Immature Granulocytes: 0.03 10*3/uL (ref 0.00–0.07)
Basophils Absolute: 0 10*3/uL (ref 0.0–0.1)
Basophils Relative: 1 %
Eosinophils Absolute: 0.4 10*3/uL (ref 0.0–0.5)
Eosinophils Relative: 5 %
HCT: 36 % — ABNORMAL LOW (ref 39.0–52.0)
Hemoglobin: 12.8 g/dL — ABNORMAL LOW (ref 13.0–17.0)
Immature Granulocytes: 1 %
Lymphocytes Relative: 23 %
Lymphs Abs: 1.5 10*3/uL (ref 0.7–4.0)
MCH: 31.7 pg (ref 26.0–34.0)
MCHC: 35.6 g/dL (ref 30.0–36.0)
MCV: 89.1 fL (ref 80.0–100.0)
Monocytes Absolute: 1.4 10*3/uL — ABNORMAL HIGH (ref 0.1–1.0)
Monocytes Relative: 21 %
Neutro Abs: 3.2 10*3/uL (ref 1.7–7.7)
Neutrophils Relative %: 49 %
Platelet Count: 182 10*3/uL (ref 150–400)
RBC: 4.04 MIL/uL — ABNORMAL LOW (ref 4.22–5.81)
RDW: 12.9 % (ref 11.5–15.5)
WBC Count: 6.5 10*3/uL (ref 4.0–10.5)
nRBC: 0 % (ref 0.0–0.2)

## 2022-02-12 MED ORDER — DIPHENHYDRAMINE HCL 25 MG PO CAPS
25.0000 mg | ORAL_CAPSULE | Freq: Once | ORAL | Status: AC
  Administered 2022-02-12: 25 mg via ORAL
  Filled 2022-02-12: qty 1

## 2022-02-12 MED ORDER — ACETAMINOPHEN 325 MG PO TABS
650.0000 mg | ORAL_TABLET | Freq: Once | ORAL | Status: AC
  Administered 2022-02-12: 650 mg via ORAL
  Filled 2022-02-12: qty 2

## 2022-02-12 MED ORDER — DARATUMUMAB-HYALURONIDASE-FIHJ 1800-30000 MG-UT/15ML ~~LOC~~ SOLN
1800.0000 mg | Freq: Once | SUBCUTANEOUS | Status: AC
  Administered 2022-02-12: 1800 mg via SUBCUTANEOUS
  Filled 2022-02-12: qty 15

## 2022-02-12 MED ORDER — FAMOTIDINE 20 MG PO TABS
40.0000 mg | ORAL_TABLET | Freq: Once | ORAL | Status: AC
  Administered 2022-02-12: 40 mg via ORAL
  Filled 2022-02-12: qty 2

## 2022-02-12 NOTE — Assessment & Plan Note (Signed)
I reviewed recent light chain studies with the patient Overall, he is responding well He felt more fatigue and occasional headaches with his current treatment I recommend we continue current dose of Pomalyst for few more cycles until we can see greater than 50% reduction of his light chain studies He has not been able to get dental clearance yet We will continue calcium with vitamin D supplement and aspirin for DVT prophylaxis, along with acyclovir for antimicrobial prophylaxis His back pain has resolved

## 2022-02-12 NOTE — Patient Instructions (Signed)
[facility]  Discharge Instructions: Thank you for choosing [facility] to provide your oncology and hematology care.   If you have a lab appointment with the [facility], please go directly to the [facility] and check in at the registration area.   Wear comfortable clothing and clothing appropriate for easy access to any Portacath or PICC line.   We strive to give you quality time with your provider. You may need to reschedule your appointment if you arrive late (15 or more minutes).  Arriving late affects you and other patients whose appointments are after yours.  Also, if you miss three or more appointments without notifying the office, you may be dismissed from the clinic at the provider's discretion.      For prescription refill requests, have your pharmacy contact our office and allow 72 hours for refills to be completed.    Today you received the following chemotherapy and/or immunotherapy agents: daratumumab-hyaluronidase-fihj      To help prevent nausea and vomiting after your treatment, we encourage you to take your nausea medication as directed.  BELOW ARE SYMPTOMS THAT SHOULD BE REPORTED IMMEDIATELY: *FEVER GREATER THAN 100.4 F (38 C) OR HIGHER *CHILLS OR SWEATING *NAUSEA AND VOMITING THAT IS NOT CONTROLLED WITH YOUR NAUSEA MEDICATION *UNUSUAL SHORTNESS OF BREATH *UNUSUAL BRUISING OR BLEEDING *URINARY PROBLEMS (pain or burning when urinating, or frequent urination) *BOWEL PROBLEMS (unusual diarrhea, constipation, pain near the anus) TENDERNESS IN MOUTH AND THROAT WITH OR WITHOUT PRESENCE OF ULCERS (sore throat, sores in mouth, or a toothache) UNUSUAL RASH, SWELLING OR PAIN  UNUSUAL VAGINAL DISCHARGE OR ITCHING   Items with * indicate a potential emergency and should be followed up as soon as possible or go to the Emergency Department if any problems should occur.  Please show the CHEMOTHERAPY ALERT CARD or IMMUNOTHERAPY ALERT  CARD at check-in to the Emergency Department and triage nurse.  Should you have questions after your visit or need to cancel or reschedule your appointment, please contact [facility]  Dept: [phone]  and follow the prompts.  Office hours are 8:00 a.m. to 4:30 p.m. Monday - Friday. Please note that voicemails left after 4:00 p.m. may not be returned until the following business day.  We are closed weekends and major holidays. You have access to a nurse at all times for urgent questions. Please call the main number to the clinic Dept: [phone] and follow the prompts.   For any non-urgent questions, you may also contact your provider using MyChart. We now offer e-Visits for anyone 69 and older to request care online for non-urgent symptoms. For details visit [email or web address].   Also download the MyChart app! Go to the app store, search "MyChart", open the app, select , and log in with your MyChart username and password.

## 2022-02-12 NOTE — Progress Notes (Signed)
[facility] OFFICE PROGRESS NOTE  Patient Care Team: [name], MD as PCP - General (Internal Medicine)  ASSESSMENT & PLAN:  Multiple myeloma in relapse [facility]) I reviewed recent light chain studies with the patient Overall, he is responding well He felt more fatigue and occasional headaches with his current treatment I recommend we continue current dose of Pomalyst for few more cycles until we can see greater than 50% reduction of his light chain studies He has not been able to get dental clearance yet We will continue calcium with vitamin D supplement and aspirin for DVT prophylaxis, along with acyclovir for antimicrobial prophylaxis His back pain has resolved  Anemia in neoplastic disease This is likely anemia of chronic disease. The patient denies recent history of bleeding such as epistaxis, hematuria or hematochezia. He is asymptomatic from the anemia. We will observe for now.  He does not require transfusion now. I do not recommend any further work-up at this time.    No orders of the defined types were placed in this encounter.   All questions were answered. The patient knows to call the clinic with any problems, questions or concerns. The total time spent in the appointment was 20 minutes encounter with patients including review of chart and various tests results, discussions about plan of care and coordination of care plan   [name], MD 02/12/2022 10:40 AM  INTERVAL HISTORY: Please see below for problem oriented charting. he returns for treatment follow-up on maintenance treatment with Pomalyst, aspirin and daratumumab He denies reaction to daratumumab He complains of some fatigue His back pain has resolved  REVIEW OF SYSTEMS:   Constitutional: Denies fevers, chills or abnormal weight loss Eyes: Denies blurriness of vision Ears, nose, mouth, throat, and face: Denies mucositis or sore throat Respiratory: Denies cough, dyspnea or  wheezes Cardiovascular: Denies palpitation, chest discomfort or lower extremity swelling Gastrointestinal:  Denies nausea, heartburn or change in bowel habits Skin: Denies abnormal skin rashes Lymphatics: Denies new lymphadenopathy or easy bruising Neurological:Denies numbness, tingling or new weaknesses Behavioral/Psych: Mood is stable, no new changes  All other systems were reviewed with the patient and are negative.  I have reviewed the past medical history, past surgical history, social history and family history with the patient and they are unchanged from previous note.  ALLERGIES:  is allergic to daratumumab and heparin.  MEDICATIONS:  Current Outpatient Medications  Medication Sig Dispense Refill   acyclovir (ZOVIRAX) 400 MG tablet Take 1 tablet (400 mg total) by mouth 2 (two) times daily. 60 tablet 11   aspirin EC 81 MG tablet Take 81 mg by mouth daily. Swallow whole.     calcium carbonate (TUMS - DOSED IN MG ELEMENTAL CALCIUM) 500 MG chewable tablet Chew 1 tablet by mouth 3 (three) times daily.     cholecalciferol (VITAMIN D3) 25 MCG (1000 UT) tablet Take 1,000 Units by mouth daily.     dexamethasone (DECADRON) 4 MG tablet Take 5 tablets (20 mg total) by mouth once a week. Take weekly on Tuesday mornings with food 20 tablet 11   ondansetron (ZOFRAN) 8 MG tablet Take 1 tablet (8 mg total) by mouth every 8 (eight) hours as needed for nausea or vomiting. 30 tablet 1   pomalidomide (POMALYST) 3 MG capsule Take 1 capsule (3 mg total) by mouth daily. Take for 21 days on, 7 days off, repeat every 28 days 21 capsule 0   prochlorperazine (COMPAZINE) 10 MG tablet Take 1 tablet (10 mg total)  by mouth every 6 (six) hours as needed for nausea or vomiting. 30 tablet 1   No current facility-administered medications for this visit.    SUMMARY OF ONCOLOGIC HISTORY: Oncology History  Multiple myeloma in relapse [facility])  06/23/2015 - 06/28/2015 Hospital Admission   The patient was admitted to the  hospital due to gait ataxia and back pain. He was subsequently found to have cord compression underwent surgery and was discharged home   06/24/2015 Imaging   Abnormal appearance of the T6 vertebral body, highly suspicious for possible osseous metastasis. Associated pathologic fracture withup to 30% height loss. There is associated abnormal soft tissue density within the ventral epidural space,   06/24/2015 Imaging   MRI lumbar: Focal osseous lesion with abnormal enhancement involving the right pedicle of L3, suspicious for possible osseous metastasisgiven the findings in the thoracic spine. Question additional focal lesion within the right iliac wing as above.     06/25/2015 Pathology Results   Accession: [id number] bone biopsy come from plasma cell neoplasm.   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 Zometa is not given due to inability to get dental clearance   12/14/2015 - 04/24/2016 Chemotherapy   He is started on maintenance treatment with Revlimid only   12/18/2015 Imaging   MRI thoracic and lumbar spine showed numerous enhancing foci throughout the thoracic and lumbar spine with several new small foci in the lumbar spine in comparison with prior MRI compatible with metastatic disease. Stable loss of height of the T3, T4, and T6 vertebral bodies and new postsurgical changes related to T6 laminectomy. No significant epidural disease or evidence for cord compression. No abnormal enhancement of the spinal cord or cauda equina.   05/02/2016 Bone Marrow Biopsy   Outside bone marrow biopsy showed 20% myeloma involvement   05/16/2016 Procedure   Successful placement of a right internal jugular approach power  injectable Port-A-Cath. The catheter is ready for immediate use.   05/21/2016 - 07/03/2016 Chemotherapy   He received Kyprolis, Cytoxan and dexamethasone    07/08/2016 Procedure   Status post CT-guided bone marrow biopsy, with tissue specimen sent to pathology for complete histopathologic analysis   07/08/2016 Bone Marrow Biopsy   Bone Marrow, Aspirate,Biopsy, and Clot BONE MARROW: - MILDLY HYPERCELLULAR MARROW (60%) WITH PLASMA CELL NEOPLASM - SEE COMMENT PERIPHERAL BLOOD: - NORMOCYTIC ANEMIA Diagnosis Note The marrow is hypercellular with lambda-restricted plasma cells consistent with persistence of the patient's previously diagnosed plasma cell neoplasm. The plasma cells comprise approximately 10-15% of the total marrow cellularity, are enlarged, and arranged in clusters.   08/14/2016 Miscellaneous   He received conditioning treatment with melphalan   08/15/2016 Bone Marrow Transplant   He received autologous stem cell transplant   08/24/2016 - 08/29/2016 Hospital Admission   His post-transplant course was complicated by E-Coli bacteremia   11/26/2016 PET scan   PET CT at [facility] 1. Technically limited study due to soft tissue uptake. 2. New hypermetabolic uptake at C7 spinous process and left proximal femur that is of questionable significance in absence of underlying CT correlate. Further assessment with whole body bone scan may be considered. 2. Redemonstrated nonhypermetabolic multifocal lucent and sclerotic lesions throughout the spine which are similar to prior.    12/02/2016  Bone Marrow Biopsy   He had repeat bone marrow biopsy at [facility] An immunohistochemical stain for CD138 is performed on the bone marrow core biopsy demonstrates increased plasma cells with focal clustering (10-20% overall), which are monotypic for lambda light chain by in situ hybridization.   01/06/2017 - 06/09/2017 Chemotherapy   He received weekly Dexamethasone, Pomalyst days 1-21 and Daratumumab. From 06/08/17  onwards, he is placed on maintenance Pomalyst only.  He self discontinue Pomalyst in February 2021   07/23/2017 Procedure   Successful right IJ vein Port-A-Cath explant.   03/22/2021 Imaging   1. New large disc protrusion at L4-5 with severe spinal stenosis. 2. History of multiple myeloma with largely resolved diffuse bone marrow heterogeneity since 2017. New enhancing lesions in the L4 and L5 vertebral bodies without acute fracture. 3. Chronic thoracic compression fractures including severe T6 vertebral body height loss which has progressed from 2017. Resolved epidural tumor.     11/26/2021 PET scan   1. Hypermetabolic patchy lytic L5 vertebral lesion compatible with metabolically active multiple myeloma. 2. No additional sites of hypermetabolic skeletal or extraskeletal myeloma. 3. Mild colonic diverticulosis.   11/27/2021 - 11/27/2021 Chemotherapy   Patient is on Treatment Plan : MYELOMA Daratumumab IV + Pomalidomide + Dexamethasone q28d x 7 cycles     11/27/2021 -  Chemotherapy   Patient is on Treatment Plan : MYELOMA RELAPSED REFRACTORY Daratumumab SQ + Pomalidomide + Dexamethasone (DaraPd) q28d       PHYSICAL EXAMINATION: ECOG PERFORMANCE STATUS: 0 - Asymptomatic  Vitals:   02/12/22 0905  BP: (!) 155/99  Pulse: 100  Resp: 18  Temp: 98.2 F (36.8 C)  SpO2: 98%   Filed Weights   02/12/22 0905  Weight: 248 lb (112.5 kg)    GENERAL:alert, no distress and comfortable NEURO: alert & oriented x 3 with fluent speech, no focal motor/sensory deficits  LABORATORY DATA:  I have reviewed the data as listed    Component Value Date/Time   NA 139 02/12/2022 0850   NA 139 01/27/2017 0809   K 4.0 02/12/2022 0850   K 4.1 01/27/2017 0809   CL 107 02/12/2022 0850   CO2 26 02/12/2022 0850   CO2 23 01/27/2017 0809   GLUCOSE 94 02/12/2022 0850   GLUCOSE 122 01/27/2017 0809   BUN 17 02/12/2022 0850   BUN 14.3 01/27/2017 0809   CREATININE 1.00 02/12/2022 0850   CREATININE 0.9  01/27/2017 0809   CALCIUM 9.5 02/12/2022 0850   CALCIUM 9.1 01/27/2017 0809   PROT 6.7 02/12/2022 0850   PROT 6.2 (L) 01/27/2017 0809   ALBUMIN 4.0 02/12/2022 0850   ALBUMIN 3.7 01/27/2017 0809   AST 15 02/12/2022 0850   AST 10 01/27/2017 0809   ALT 17 02/12/2022 0850   ALT 18 01/27/2017 0809   ALKPHOS 50 02/12/2022 0850   ALKPHOS 42 01/27/2017 0809   BILITOT 0.5 02/12/2022 0850   BILITOT 0.72 01/27/2017 0809   GFRNONAA >60 02/12/2022 0850   GFRAA >60 10/18/2019 1017    No results found for: "SPEP", "UPEP"  Lab Results  Component Value Date   WBC 6.5 02/12/2022   NEUTROABS 3.2 02/12/2022   HGB 12.8 (L) 02/12/2022   HCT 36.0 (L) 02/12/2022   MCV 89.1 02/12/2022   PLT 182 02/12/2022      Chemistry      Component Value Date/Time   NA 139 02/12/2022 0850   NA 139 01/27/2017 0809   K 4.0 02/12/2022 0850   K 4.1 01/27/2017 0809  CL 107 02/12/2022 0850   CO2 26 02/12/2022 0850   CO2 23 01/27/2017 0809   BUN 17 02/12/2022 0850   BUN 14.3 01/27/2017 0809   CREATININE 1.00 02/12/2022 0850   CREATININE 0.9 01/27/2017 0809      Component Value Date/Time   CALCIUM 9.5 02/12/2022 0850   CALCIUM 9.1 01/27/2017 0809   ALKPHOS 50 02/12/2022 0850   ALKPHOS 42 01/27/2017 0809   AST 15 02/12/2022 0850   AST 10 01/27/2017 0809   ALT 17 02/12/2022 0850   ALT 18 01/27/2017 0809   BILITOT 0.5 02/12/2022 0850   BILITOT 0.72 01/27/2017 0809

## 2022-02-12 NOTE — Assessment & Plan Note (Signed)
This is likely anemia of chronic disease. The patient denies recent history of bleeding such as epistaxis, hematuria or hematochezia. He is asymptomatic from the anemia. We will observe for now.  He does not require transfusion now. I do not recommend any further work-up at this time.

## 2022-02-13 ENCOUNTER — Other Ambulatory Visit: Payer: Self-pay

## 2022-02-14 ENCOUNTER — Other Ambulatory Visit: Payer: Self-pay

## 2022-02-14 DIAGNOSIS — C9002 Multiple myeloma in relapse: Secondary | ICD-10-CM

## 2022-02-14 MED ORDER — POMALIDOMIDE 3 MG PO CAPS: 3.0000 mg | ORAL_CAPSULE | Freq: Every day | ORAL | 0 refills | Status: DC

## 2022-02-26 ENCOUNTER — Other Ambulatory Visit: Payer: Self-pay

## 2022-02-26 ENCOUNTER — Inpatient Hospital Stay: Payer: No Typology Code available for payment source

## 2022-02-26 VITALS — BP 157/97 | HR 83 | Temp 98.0°F | Resp 18 | Wt 250.8 lb

## 2022-02-26 DIAGNOSIS — C9002 Multiple myeloma in relapse: Secondary | ICD-10-CM

## 2022-02-26 DIAGNOSIS — Z5112 Encounter for antineoplastic immunotherapy: Secondary | ICD-10-CM | POA: Diagnosis not present

## 2022-02-26 LAB — CMP (CANCER CENTER ONLY)
ALT: 18 U/L (ref 0–44)
AST: 17 U/L (ref 15–41)
Albumin: 3.9 g/dL (ref 3.5–5.0)
Alkaline Phosphatase: 43 U/L (ref 38–126)
Anion gap: 4 — ABNORMAL LOW (ref 5–15)
BUN: 12 mg/dL (ref 6–20)
CO2: 28 mmol/L (ref 22–32)
Calcium: 9.3 mg/dL (ref 8.9–10.3)
Chloride: 107 mmol/L (ref 98–111)
Creatinine: 1.05 mg/dL (ref 0.61–1.24)
GFR, Estimated: >60 mL/min (ref >60)
Glucose, Bld: 123 mg/dL — ABNORMAL HIGH (ref 70–99)
Potassium: 3.9 mmol/L (ref 3.5–5.1)
Sodium: 139 mmol/L (ref 135–145)
Total Bilirubin: 0.7 mg/dL (ref 0.3–1.2)
Total Protein: 6.3 g/dL — ABNORMAL LOW (ref 6.5–8.1)

## 2022-02-26 LAB — CBC WITH DIFFERENTIAL (CANCER CENTER ONLY)
Abs Immature Granulocytes: 0.01 10*3/uL (ref 0.00–0.07)
Basophils Absolute: 0 10*3/uL (ref 0.0–0.1)
Basophils Relative: 1 %
Eosinophils Absolute: 0.2 10*3/uL (ref 0.0–0.5)
Eosinophils Relative: 5 %
HCT: 35 % — ABNORMAL LOW (ref 39.0–52.0)
Hemoglobin: 12.1 g/dL — ABNORMAL LOW (ref 13.0–17.0)
Immature Granulocytes: 0 %
Lymphocytes Relative: 37 %
Lymphs Abs: 1.9 10*3/uL (ref 0.7–4.0)
MCH: 31.5 pg (ref 26.0–34.0)
MCHC: 34.6 g/dL (ref 30.0–36.0)
MCV: 91.1 fL (ref 80.0–100.0)
Monocytes Absolute: 0.3 10*3/uL (ref 0.1–1.0)
Monocytes Relative: 7 %
Neutro Abs: 2.6 10*3/uL (ref 1.7–7.7)
Neutrophils Relative %: 50 %
Platelet Count: 200 10*3/uL (ref 150–400)
RBC: 3.84 MIL/uL — ABNORMAL LOW (ref 4.22–5.81)
RDW: 13.4 % (ref 11.5–15.5)
WBC Count: 5.1 10*3/uL (ref 4.0–10.5)
nRBC: 0 % (ref 0.0–0.2)

## 2022-02-26 MED ORDER — ACETAMINOPHEN 325 MG PO TABS
650.0000 mg | ORAL_TABLET | Freq: Once | ORAL | Status: AC
  Administered 2022-02-26: 650 mg via ORAL
  Filled 2022-02-26: qty 2

## 2022-02-26 MED ORDER — DARATUMUMAB-HYALURONIDASE-FIHJ 1800-30000 MG-UT/15ML ~~LOC~~ SOLN
1800.0000 mg | Freq: Once | SUBCUTANEOUS | Status: AC
  Administered 2022-02-26: 1800 mg via SUBCUTANEOUS
  Filled 2022-02-26: qty 15

## 2022-02-26 MED ORDER — DIPHENHYDRAMINE HCL 25 MG PO CAPS
25.0000 mg | ORAL_CAPSULE | Freq: Once | ORAL | Status: AC
  Administered 2022-02-26: 25 mg via ORAL
  Filled 2022-02-26: qty 1

## 2022-02-26 MED ORDER — FAMOTIDINE 20 MG PO TABS
40.0000 mg | ORAL_TABLET | Freq: Once | ORAL | Status: AC
  Administered 2022-02-26: 40 mg via ORAL
  Filled 2022-02-26: qty 2

## 2022-02-26 NOTE — Progress Notes (Signed)
Patient took his dexamethasone this morning at [facility]

## 2022-02-26 NOTE — Patient Instructions (Signed)
[facility]  Discharge Instructions: Thank you for choosing [facility] to provide your oncology and hematology care.   If you have a lab appointment with the Cancer Center, please go directly to the Cancer Center and check in at the registration area.   Wear comfortable clothing and clothing appropriate for easy access to any Portacath or PICC line.   We strive to give you quality time with your provider. You may need to reschedule your appointment if you arrive late (15 or more minutes).  Arriving late affects you and other patients whose appointments are after yours.  Also, if you miss three or more appointments without notifying the office, you may be dismissed from the clinic at the provider's discretion.      For prescription refill requests, have your pharmacy contact our office and allow 72 hours for refills to be completed.    Today you received the following chemotherapy and/or immunotherapy agents: daratumumab-hyaluronidase-fihj      To help prevent nausea and vomiting after your treatment, we encourage you to take your nausea medication as directed.  BELOW ARE SYMPTOMS THAT SHOULD BE REPORTED IMMEDIATELY: *FEVER GREATER THAN 100.4 F (38 C) OR HIGHER *CHILLS OR SWEATING *NAUSEA AND VOMITING THAT IS NOT CONTROLLED WITH YOUR NAUSEA MEDICATION *UNUSUAL SHORTNESS OF BREATH *UNUSUAL BRUISING OR BLEEDING *URINARY PROBLEMS (pain or burning when urinating, or frequent urination) *BOWEL PROBLEMS (unusual diarrhea, constipation, pain near the anus) TENDERNESS IN MOUTH AND THROAT WITH OR WITHOUT PRESENCE OF ULCERS (sore throat, sores in mouth, or a toothache) UNUSUAL RASH, SWELLING OR PAIN  UNUSUAL VAGINAL DISCHARGE OR ITCHING   Items with * indicate a potential emergency and should be followed up as soon as possible or go to the Emergency Department if any problems should occur.  Please show the CHEMOTHERAPY ALERT CARD or  IMMUNOTHERAPY ALERT CARD at check-in to the Emergency Department and triage nurse.  Should you have questions after your visit or need to cancel or reschedule your appointment, please contact [facility]  Dept: [phone]  and follow the prompts.  Office hours are 8:00 a.m. to 4:30 p.m. Monday - Friday. Please note that voicemails left after 4:00 p.m. may not be returned until the following business day.  We are closed weekends and major holidays. You have access to a nurse at all times for urgent questions. Please call the main number to the clinic Dept: [phone] and follow the prompts.   For any non-urgent questions, you may also contact your provider using MyChart. We now offer e-Visits for anyone 18 and older to request care online for non-urgent symptoms. For details visit [email or web address].   Also download the MyChart app! Go to the app store, search "MyChart", open the app, select , and log in with your MyChart username and password.   

## 2022-02-27 LAB — KAPPA/LAMBDA LIGHT CHAINS
Kappa free light chain: 11.3 mg/L (ref 3.3–19.4)
Kappa, lambda light chain ratio: 0.04 — ABNORMAL LOW (ref 0.26–1.65)
Lambda free light chains: 282.2 mg/L — ABNORMAL HIGH (ref 5.7–26.3)

## 2022-03-04 LAB — MULTIPLE MYELOMA PANEL, SERUM
Albumin SerPl Elph-Mcnc: 3.6 g/dL (ref 2.9–4.4)
Albumin/Glob SerPl: 1.6 (ref 0.7–1.7)
Alpha 1: 0.2 g/dL (ref 0.0–0.4)
Alpha2 Glob SerPl Elph-Mcnc: 0.7 g/dL (ref 0.4–1.0)
B-Globulin SerPl Elph-Mcnc: 0.8 g/dL (ref 0.7–1.3)
Gamma Glob SerPl Elph-Mcnc: 0.6 g/dL (ref 0.4–1.8)
Globulin, Total: 2.3 g/dL (ref 2.2–3.9)
IgA: 35 mg/dL — ABNORMAL LOW (ref 90–386)
IgG (Immunoglobin G), Serum: 606 mg/dL (ref 603–1613)
IgM (Immunoglobulin M), Srm: 48 mg/dL (ref 20–172)
M Protein SerPl Elph-Mcnc: 0.2 g/dL — ABNORMAL HIGH
Total Protein ELP: 5.9 g/dL — ABNORMAL LOW (ref 6.0–8.5)

## 2022-03-12 ENCOUNTER — Inpatient Hospital Stay: Payer: No Typology Code available for payment source | Attending: Internal Medicine

## 2022-03-12 ENCOUNTER — Other Ambulatory Visit: Payer: Self-pay

## 2022-03-12 ENCOUNTER — Encounter: Payer: Self-pay | Admitting: Hematology and Oncology

## 2022-03-12 ENCOUNTER — Inpatient Hospital Stay (HOSPITAL_BASED_OUTPATIENT_CLINIC_OR_DEPARTMENT_OTHER): Payer: No Typology Code available for payment source | Admitting: Hematology and Oncology

## 2022-03-12 ENCOUNTER — Inpatient Hospital Stay: Payer: No Typology Code available for payment source

## 2022-03-12 VITALS — BP 150/80 | HR 78 | Temp 98.4°F | Resp 18 | Ht 74.0 in | Wt 254.2 lb

## 2022-03-12 VITALS — BP 153/92 | HR 77 | Temp 98.3°F | Resp 18

## 2022-03-12 DIAGNOSIS — C9002 Multiple myeloma in relapse: Secondary | ICD-10-CM

## 2022-03-12 DIAGNOSIS — Z5112 Encounter for antineoplastic immunotherapy: Secondary | ICD-10-CM | POA: Insufficient documentation

## 2022-03-12 DIAGNOSIS — Z79899 Other long term (current) drug therapy: Secondary | ICD-10-CM | POA: Insufficient documentation

## 2022-03-12 DIAGNOSIS — D630 Anemia in neoplastic disease: Secondary | ICD-10-CM

## 2022-03-12 LAB — CMP (CANCER CENTER ONLY)
ALT: 32 U/L (ref 0–44)
AST: 21 U/L (ref 15–41)
Albumin: 4.1 g/dL (ref 3.5–5.0)
Alkaline Phosphatase: 46 U/L (ref 38–126)
Anion gap: 5 (ref 5–15)
BUN: 18 mg/dL (ref 6–20)
CO2: 27 mmol/L (ref 22–32)
Calcium: 9.3 mg/dL (ref 8.9–10.3)
Chloride: 105 mmol/L (ref 98–111)
Creatinine: 1 mg/dL (ref 0.61–1.24)
GFR, Estimated: >60 mL/min (ref >60)
Glucose, Bld: 110 mg/dL — ABNORMAL HIGH (ref 70–99)
Potassium: 4.1 mmol/L (ref 3.5–5.1)
Sodium: 137 mmol/L (ref 135–145)
Total Bilirubin: 0.5 mg/dL (ref 0.3–1.2)
Total Protein: 6.2 g/dL — ABNORMAL LOW (ref 6.5–8.1)

## 2022-03-12 LAB — CBC WITH DIFFERENTIAL (CANCER CENTER ONLY)
Abs Immature Granulocytes: 0 10*3/uL (ref 0.00–0.07)
Basophils Absolute: 0.1 10*3/uL (ref 0.0–0.1)
Basophils Relative: 2 %
Eosinophils Absolute: 0.5 10*3/uL (ref 0.0–0.5)
Eosinophils Relative: 11 %
HCT: 35 % — ABNORMAL LOW (ref 39.0–52.0)
Hemoglobin: 12.4 g/dL — ABNORMAL LOW (ref 13.0–17.0)
Immature Granulocytes: 0 %
Lymphocytes Relative: 37 %
Lymphs Abs: 1.7 10*3/uL (ref 0.7–4.0)
MCH: 31.8 pg (ref 26.0–34.0)
MCHC: 35.4 g/dL (ref 30.0–36.0)
MCV: 89.7 fL (ref 80.0–100.0)
Monocytes Absolute: 0.5 10*3/uL (ref 0.1–1.0)
Monocytes Relative: 10 %
Neutro Abs: 1.9 10*3/uL (ref 1.7–7.7)
Neutrophils Relative %: 40 %
Platelet Count: 185 10*3/uL (ref 150–400)
RBC: 3.9 MIL/uL — ABNORMAL LOW (ref 4.22–5.81)
RDW: 13.3 % (ref 11.5–15.5)
WBC Count: 4.6 10*3/uL (ref 4.0–10.5)
nRBC: 0 % (ref 0.0–0.2)

## 2022-03-12 MED ORDER — DIPHENHYDRAMINE HCL 25 MG PO CAPS
25.0000 mg | ORAL_CAPSULE | Freq: Once | ORAL | Status: AC
  Administered 2022-03-12: 25 mg via ORAL
  Filled 2022-03-12: qty 1

## 2022-03-12 MED ORDER — ACETAMINOPHEN 325 MG PO TABS
650.0000 mg | ORAL_TABLET | Freq: Once | ORAL | Status: AC
  Administered 2022-03-12: 650 mg via ORAL
  Filled 2022-03-12: qty 2

## 2022-03-12 MED ORDER — DARATUMUMAB-HYALURONIDASE-FIHJ 1800-30000 MG-UT/15ML ~~LOC~~ SOLN
1800.0000 mg | Freq: Once | SUBCUTANEOUS | Status: AC
  Administered 2022-03-12: 1800 mg via SUBCUTANEOUS
  Filled 2022-03-12: qty 15

## 2022-03-12 MED ORDER — POMALIDOMIDE 3 MG PO CAPS: 3.0000 mg | ORAL_CAPSULE | Freq: Every day | ORAL | 0 refills | Status: DC

## 2022-03-12 MED ORDER — FAMOTIDINE 20 MG PO TABS
40.0000 mg | ORAL_TABLET | Freq: Once | ORAL | Status: AC
  Administered 2022-03-12: 40 mg via ORAL
  Filled 2022-03-12: qty 2

## 2022-03-12 NOTE — Assessment & Plan Note (Signed)
I reviewed recent myeloma panel with the patient His last set of serum light chain was worse The patient confessed that he did not take medication/Pomalyst for a week or 2 due to feeling unwell We discussed treatment options For now, he would like to continue his Pomalyst as prescribed We will follow and trend his myeloma panel for the next month If his serum light chains are not improving, we might have to switch him to a different treatment and he is in agreement He will continue aspirin for DVT prophylaxis He will continue acyclovir for antimicrobial prophylaxis He will continue calcium with vitamin D supplement He is not receiving bisphosphonate due to inability to get dental clearance yet

## 2022-03-12 NOTE — Progress Notes (Signed)
Per Roselyn Reef, RN, pt took dexamethasone at home.  Laray Anger, PharmD PGY-2 Pharmacy Resident Hematology/Oncology [phone]  03/12/2022 10:31 AM

## 2022-03-12 NOTE — Assessment & Plan Note (Signed)
This is likely anemia of chronic disease. The patient denies recent history of bleeding such as epistaxis, hematuria or hematochezia. He is asymptomatic from the anemia. We will observe for now.  He does not require transfusion now. I do not recommend any further work-up at this time.

## 2022-03-12 NOTE — Progress Notes (Signed)
[facility] OFFICE PROGRESS NOTE  Patient Care Team: [name], MD as PCP - General (Internal Medicine)  ASSESSMENT & PLAN:  Multiple myeloma in relapse [facility]) I reviewed recent myeloma panel with the patient His last set of serum light chain was worse The patient confessed that he did not take medication/Pomalyst for a week or 2 due to feeling unwell We discussed treatment options For now, he would like to continue his Pomalyst as prescribed We will follow and trend his myeloma panel for the next month If his serum light chains are not improving, we might have to switch him to a different treatment and he is in agreement He will continue aspirin for DVT prophylaxis He will continue acyclovir for antimicrobial prophylaxis He will continue calcium with vitamin D supplement He is not receiving bisphosphonate due to inability to get dental clearance yet  Anemia in neoplastic disease This is likely anemia of chronic disease. The patient denies recent history of bleeding such as epistaxis, hematuria or hematochezia. He is asymptomatic from the anemia. We will observe for now.  He does not require transfusion now. I do not recommend any further work-up at this time.    No orders of the defined types were placed in this encounter.   All questions were answered. The patient knows to call the clinic with any problems, questions or concerns. The total time spent in the appointment was 20 minutes encounter with patients including review of chart and various tests results, discussions about plan of care and coordination of care plan   [name], MD 03/12/2022 3:36 PM  INTERVAL HISTORY: Please see below for problem oriented charting. he returns for treatment follow-up on treatment for myeloma The patient was not feeling well recently and skipped Pomalyst for a week or 2 He felt better now He denies new bone pain No recent infection  We discussed test results and  treatment options  REVIEW OF SYSTEMS:   Constitutional: Denies fevers, chills or abnormal weight loss Eyes: Denies blurriness of vision Ears, nose, mouth, throat, and face: Denies mucositis or sore throat Respiratory: Denies cough, dyspnea or wheezes Cardiovascular: Denies palpitation, chest discomfort or lower extremity swelling Gastrointestinal:  Denies nausea, heartburn or change in bowel habits Skin: Denies abnormal skin rashes Lymphatics: Denies new lymphadenopathy or easy bruising Neurological:Denies numbness, tingling or new weaknesses Behavioral/Psych: Mood is stable, no new changes  All other systems were reviewed with the patient and are negative.  I have reviewed the past medical history, past surgical history, social history and family history with the patient and they are unchanged from previous note.  ALLERGIES:  is allergic to daratumumab and heparin.  MEDICATIONS:  Current Outpatient Medications  Medication Sig Dispense Refill   acyclovir (ZOVIRAX) 400 MG tablet Take 1 tablet (400 mg total) by mouth 2 (two) times daily. 60 tablet 11   aspirin EC 81 MG tablet Take 81 mg by mouth daily. Swallow whole.     calcium carbonate (TUMS - DOSED IN MG ELEMENTAL CALCIUM) 500 MG chewable tablet Chew 1 tablet by mouth 3 (three) times daily.     cholecalciferol (VITAMIN D3) 25 MCG (1000 UT) tablet Take 1,000 Units by mouth daily.     dexamethasone (DECADRON) 4 MG tablet Take 5 tablets (20 mg total) by mouth once a week. Take weekly on Tuesday mornings with food 20 tablet 11   ondansetron (ZOFRAN) 8 MG tablet Take 1 tablet (8 mg total) by mouth every 8 (eight) hours as  needed for nausea or vomiting. 30 tablet 1   pomalidomide (POMALYST) 3 MG capsule Take 1 capsule (3 mg total) by mouth daily. Take for 21 days on, 7 days off, repeat every 28 days 21 capsule 0   prochlorperazine (COMPAZINE) 10 MG tablet Take 1 tablet (10 mg total) by mouth every 6 (six) hours as needed for nausea or  vomiting. 30 tablet 1   No current facility-administered medications for this visit.    SUMMARY OF ONCOLOGIC HISTORY: Oncology History  Multiple myeloma in relapse [facility])  06/23/2015 - 06/28/2015 Hospital Admission   The patient was admitted to the hospital due to gait ataxia and back pain. He was subsequently found to have cord compression underwent surgery and was discharged home   06/24/2015 Imaging   Abnormal appearance of the T6 vertebral body, highly suspicious for possible osseous metastasis. Associated pathologic fracture withup to 30% height loss. There is associated abnormal soft tissue density within the ventral epidural space,   06/24/2015 Imaging   MRI lumbar: Focal osseous lesion with abnormal enhancement involving the right pedicle of L3, suspicious for possible osseous metastasisgiven the findings in the thoracic spine. Question additional focal lesion within the right iliac wing as above.     06/25/2015 Pathology Results   Accession: [id number] bone biopsy come from plasma cell neoplasm.   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 Zometa is not given due to inability to get dental clearance   12/14/2015 - 04/24/2016 Chemotherapy   He is started on maintenance treatment with Revlimid only   12/18/2015 Imaging   MRI thoracic and lumbar spine showed numerous enhancing foci throughout the thoracic and lumbar spine with several new small foci in the lumbar spine in comparison with prior MRI compatible with metastatic disease. Stable loss of height of the T3, T4, and T6 vertebral bodies and new postsurgical changes related to T6 laminectomy. No significant epidural disease or evidence for cord  compression. No abnormal enhancement of the spinal cord or cauda equina.   05/02/2016 Bone Marrow Biopsy   Outside bone marrow biopsy showed 20% myeloma involvement   05/16/2016 Procedure   Successful placement of a right internal jugular approach power injectable Port-A-Cath. The catheter is ready for immediate use.   05/21/2016 - 07/03/2016 Chemotherapy   He received Kyprolis, Cytoxan and dexamethasone    07/08/2016 Procedure   Status post CT-guided bone marrow biopsy, with tissue specimen sent to pathology for complete histopathologic analysis   07/08/2016 Bone Marrow Biopsy   Bone Marrow, Aspirate,Biopsy, and Clot BONE MARROW: - MILDLY HYPERCELLULAR MARROW (60%) WITH PLASMA CELL NEOPLASM - SEE COMMENT PERIPHERAL BLOOD: - NORMOCYTIC ANEMIA Diagnosis Note The marrow is hypercellular with lambda-restricted plasma cells consistent with persistence of the patient's previously diagnosed plasma cell neoplasm. The plasma cells comprise approximately 10-15% of the total marrow cellularity, are enlarged, and arranged in clusters.   08/14/2016 Miscellaneous   He received conditioning treatment with melphalan   08/15/2016 Bone Marrow Transplant   He received autologous stem cell transplant   08/24/2016 - 08/29/2016 Hospital Admission   His post-transplant course was complicated by E-Coli bacteremia   11/26/2016 PET scan   PET CT at [facility] 1. Technically limited study due to soft tissue uptake. 2.  New hypermetabolic uptake at C7 spinous process and left proximal femur that is of questionable significance in absence of underlying CT correlate. Further assessment with whole body bone scan may be considered. 2. Redemonstrated nonhypermetabolic multifocal lucent and sclerotic lesions throughout the spine which are similar to prior.    12/02/2016 Bone Marrow Biopsy   He had repeat bone marrow biopsy at [facility] An immunohistochemical stain for CD138 is performed on the bone marrow core biopsy demonstrates  increased plasma cells with focal clustering (10-20% overall), which are monotypic for lambda light chain by in situ hybridization.   01/06/2017 - 06/09/2017 Chemotherapy   He received weekly Dexamethasone, Pomalyst days 1-21 and Daratumumab. From 06/08/17 onwards, he is placed on maintenance Pomalyst only.  He self discontinue Pomalyst in February 2021   07/23/2017 Procedure   Successful right IJ vein Port-A-Cath explant.   03/22/2021 Imaging   1. New large disc protrusion at L4-5 with severe spinal stenosis. 2. History of multiple myeloma with largely resolved diffuse bone marrow heterogeneity since 2017. New enhancing lesions in the L4 and L5 vertebral bodies without acute fracture. 3. Chronic thoracic compression fractures including severe T6 vertebral body height loss which has progressed from 2017. Resolved epidural tumor.     11/26/2021 PET scan   1. Hypermetabolic patchy lytic L5 vertebral lesion compatible with metabolically active multiple myeloma. 2. No additional sites of hypermetabolic skeletal or extraskeletal myeloma. 3. Mild colonic diverticulosis.   11/27/2021 - 11/27/2021 Chemotherapy   Patient is on Treatment Plan : MYELOMA Daratumumab IV + Pomalidomide + Dexamethasone q28d x 7 cycles     11/27/2021 -  Chemotherapy   Patient is on Treatment Plan : MYELOMA RELAPSED REFRACTORY Daratumumab SQ + Pomalidomide + Dexamethasone (DaraPd) q28d       PHYSICAL EXAMINATION: ECOG PERFORMANCE STATUS: 1 - Symptomatic but completely ambulatory  Vitals:   03/12/22 0942  BP: (!) 150/80  Pulse: 78  Resp: 18  Temp: 98.4 F (36.9 C)  SpO2: 100%   Filed Weights   03/12/22 0942  Weight: 254 lb 3.2 oz (115.3 kg)    GENERAL:alert, no distress and comfortable  NEURO: alert & oriented x 3 with fluent speech, no focal motor/sensory deficits  LABORATORY DATA:  I have reviewed the data as listed    Component Value Date/Time   NA 137 03/12/2022 0923   NA 139 01/27/2017 0809   K  4.1 03/12/2022 0923   K 4.1 01/27/2017 0809   CL 105 03/12/2022 0923   CO2 27 03/12/2022 0923   CO2 23 01/27/2017 0809   GLUCOSE 110 (H) 03/12/2022 0923   GLUCOSE 122 01/27/2017 0809   BUN 18 03/12/2022 0923   BUN 14.3 01/27/2017 0809   CREATININE 1.00 03/12/2022 0923   CREATININE 0.9 01/27/2017 0809   CALCIUM 9.3 03/12/2022 0923   CALCIUM 9.1 01/27/2017 0809   PROT 6.2 (L) 03/12/2022 0923   PROT 6.2 (L) 01/27/2017 0809   ALBUMIN 4.1 03/12/2022 0923   ALBUMIN 3.7 01/27/2017 0809   AST 21 03/12/2022 0923   AST 10 01/27/2017 0809   ALT 32 03/12/2022 0923   ALT 18 01/27/2017 0809   ALKPHOS 46 03/12/2022 0923   ALKPHOS 42 01/27/2017 0809   BILITOT 0.5 03/12/2022 0923   BILITOT 0.72 01/27/2017 0809   GFRNONAA >60 03/12/2022 0923   GFRAA >60 10/18/2019 1017    No results found for: "SPEP", "UPEP"  Lab Results  Component Value Date   WBC 4.6 03/12/2022   NEUTROABS 1.9 03/12/2022  HGB 12.4 (L) 03/12/2022   HCT 35.0 (L) 03/12/2022   MCV 89.7 03/12/2022   PLT 185 03/12/2022      Chemistry      Component Value Date/Time   NA 137 03/12/2022 0923   NA 139 01/27/2017 0809   K 4.1 03/12/2022 0923   K 4.1 01/27/2017 0809   CL 105 03/12/2022 0923   CO2 27 03/12/2022 0923   CO2 23 01/27/2017 0809   BUN 18 03/12/2022 0923   BUN 14.3 01/27/2017 0809   CREATININE 1.00 03/12/2022 0923   CREATININE 0.9 01/27/2017 0809      Component Value Date/Time   CALCIUM 9.3 03/12/2022 0923   CALCIUM 9.1 01/27/2017 0809   ALKPHOS 46 03/12/2022 0923   ALKPHOS 42 01/27/2017 0809   AST 21 03/12/2022 0923   AST 10 01/27/2017 0809   ALT 32 03/12/2022 0923   ALT 18 01/27/2017 0809   BILITOT 0.5 03/12/2022 0923   BILITOT 0.72 01/27/2017 0809

## 2022-03-12 NOTE — Patient Instructions (Signed)
[facility]  Discharge Instructions: Thank you for choosing [facility] to provide your oncology and hematology care.   If you have a lab appointment with the [facility], please go directly to the [facility] and check in at the registration area.   Wear comfortable clothing and clothing appropriate for easy access to any Portacath or PICC line.   We strive to give you quality time with your provider. You may need to reschedule your appointment if you arrive late (15 or more minutes).  Arriving late affects you and other patients whose appointments are after yours.  Also, if you miss three or more appointments without notifying the office, you may be dismissed from the clinic at the provider's discretion.      For prescription refill requests, have your pharmacy contact our office and allow 72 hours for refills to be completed.    Today you received the following chemotherapy and/or immunotherapy agents: darzalex faspro       To help prevent nausea and vomiting after your treatment, we encourage you to take your nausea medication as directed.  BELOW ARE SYMPTOMS THAT SHOULD BE REPORTED IMMEDIATELY: *FEVER GREATER THAN 100.4 F (38 C) OR HIGHER *CHILLS OR SWEATING *NAUSEA AND VOMITING THAT IS NOT CONTROLLED WITH YOUR NAUSEA MEDICATION *UNUSUAL SHORTNESS OF BREATH *UNUSUAL BRUISING OR BLEEDING *URINARY PROBLEMS (pain or burning when urinating, or frequent urination) *BOWEL PROBLEMS (unusual diarrhea, constipation, pain near the anus) TENDERNESS IN MOUTH AND THROAT WITH OR WITHOUT PRESENCE OF ULCERS (sore throat, sores in mouth, or a toothache) UNUSUAL RASH, SWELLING OR PAIN  UNUSUAL VAGINAL DISCHARGE OR ITCHING   Items with * indicate a potential emergency and should be followed up as soon as possible or go to the Emergency Department if any problems should occur.  Please show the CHEMOTHERAPY ALERT CARD or IMMUNOTHERAPY ALERT CARD  at check-in to the Emergency Department and triage nurse.  Should you have questions after your visit or need to cancel or reschedule your appointment, please contact [facility]  Dept: [phone]  and follow the prompts.  Office hours are 8:00 a.m. to 4:30 p.m. Monday - Friday. Please note that voicemails left after 4:00 p.m. may not be returned until the following business day.  We are closed weekends and major holidays. You have access to a nurse at all times for urgent questions. Please call the main number to the clinic Dept: [phone] and follow the prompts.   For any non-urgent questions, you may also contact your provider using MyChart. We now offer e-Visits for anyone 67 and older to request care online for non-urgent symptoms. For details visit [email or web address].   Also download the MyChart app! Go to the app store, search "MyChart", open the app, select [facility], and log in with your MyChart username and password.

## 2022-03-13 ENCOUNTER — Telehealth: Payer: Self-pay | Admitting: Hematology and Oncology

## 2022-03-13 ENCOUNTER — Other Ambulatory Visit: Payer: Self-pay

## 2022-03-13 NOTE — Telephone Encounter (Signed)
Spoke with patient confirming upcoming appointments  

## 2022-03-21 ENCOUNTER — Other Ambulatory Visit: Payer: Self-pay

## 2022-03-24 ENCOUNTER — Other Ambulatory Visit: Payer: Self-pay

## 2022-03-26 ENCOUNTER — Inpatient Hospital Stay: Payer: No Typology Code available for payment source

## 2022-03-26 ENCOUNTER — Other Ambulatory Visit: Payer: Self-pay

## 2022-03-26 ENCOUNTER — Ambulatory Visit: Payer: No Typology Code available for payment source | Admitting: Hematology and Oncology

## 2022-03-26 VITALS — BP 159/105 | HR 82 | Temp 98.0°F | Resp 17 | Wt 251.8 lb

## 2022-03-26 DIAGNOSIS — Z5112 Encounter for antineoplastic immunotherapy: Secondary | ICD-10-CM | POA: Diagnosis not present

## 2022-03-26 DIAGNOSIS — C9002 Multiple myeloma in relapse: Secondary | ICD-10-CM

## 2022-03-26 LAB — CMP (CANCER CENTER ONLY)
ALT: 17 U/L (ref 0–44)
AST: 15 U/L (ref 15–41)
Albumin: 4.2 g/dL (ref 3.5–5.0)
Alkaline Phosphatase: 50 U/L (ref 38–126)
Anion gap: 5 (ref 5–15)
BUN: 13 mg/dL (ref 6–20)
CO2: 28 mmol/L (ref 22–32)
Calcium: 9 mg/dL (ref 8.9–10.3)
Chloride: 106 mmol/L (ref 98–111)
Creatinine: 0.87 mg/dL (ref 0.61–1.24)
GFR, Estimated: >60 mL/min (ref >60)
Glucose, Bld: 89 mg/dL (ref 70–99)
Potassium: 3.9 mmol/L (ref 3.5–5.1)
Sodium: 139 mmol/L (ref 135–145)
Total Bilirubin: 0.6 mg/dL (ref 0.3–1.2)
Total Protein: 6.5 g/dL (ref 6.5–8.1)

## 2022-03-26 LAB — CBC WITH DIFFERENTIAL (CANCER CENTER ONLY)
Abs Immature Granulocytes: 0.01 10*3/uL (ref 0.00–0.07)
Basophils Absolute: 0 10*3/uL (ref 0.0–0.1)
Basophils Relative: 1 %
Eosinophils Absolute: 0.2 10*3/uL (ref 0.0–0.5)
Eosinophils Relative: 4 %
HCT: 35.7 % — ABNORMAL LOW (ref 39.0–52.0)
Hemoglobin: 12.3 g/dL — ABNORMAL LOW (ref 13.0–17.0)
Immature Granulocytes: 0 %
Lymphocytes Relative: 35 %
Lymphs Abs: 1.8 10*3/uL (ref 0.7–4.0)
MCH: 31.3 pg (ref 26.0–34.0)
MCHC: 34.5 g/dL (ref 30.0–36.0)
MCV: 90.8 fL (ref 80.0–100.0)
Monocytes Absolute: 0.3 10*3/uL (ref 0.1–1.0)
Monocytes Relative: 6 %
Neutro Abs: 2.7 10*3/uL (ref 1.7–7.7)
Neutrophils Relative %: 54 %
Platelet Count: 195 10*3/uL (ref 150–400)
RBC: 3.93 MIL/uL — ABNORMAL LOW (ref 4.22–5.81)
RDW: 13.2 % (ref 11.5–15.5)
WBC Count: 5 10*3/uL (ref 4.0–10.5)
nRBC: 0 % (ref 0.0–0.2)

## 2022-03-26 MED ORDER — DARATUMUMAB-HYALURONIDASE-FIHJ 1800-30000 MG-UT/15ML ~~LOC~~ SOLN
1800.0000 mg | Freq: Once | SUBCUTANEOUS | Status: AC
  Administered 2022-03-26: 1800 mg via SUBCUTANEOUS
  Filled 2022-03-26: qty 15

## 2022-03-26 MED ORDER — ACETAMINOPHEN 325 MG PO TABS
650.0000 mg | ORAL_TABLET | Freq: Once | ORAL | Status: AC
  Administered 2022-03-26: 650 mg via ORAL
  Filled 2022-03-26: qty 2

## 2022-03-26 MED ORDER — DIPHENHYDRAMINE HCL 25 MG PO CAPS
25.0000 mg | ORAL_CAPSULE | Freq: Once | ORAL | Status: AC
  Administered 2022-03-26: 25 mg via ORAL
  Filled 2022-03-26: qty 1

## 2022-03-26 MED ORDER — FAMOTIDINE 20 MG PO TABS
40.0000 mg | ORAL_TABLET | Freq: Once | ORAL | Status: AC
  Administered 2022-03-26: 40 mg via ORAL
  Filled 2022-03-26: qty 2

## 2022-03-26 NOTE — Progress Notes (Signed)
Pt reported taking dexamethasone at 0800 prior to arrival to infusion.

## 2022-03-26 NOTE — Patient Instructions (Signed)
[facility]  Discharge Instructions: Thank you for choosing [facility] to provide your oncology and hematology care.   If you have a lab appointment with the [facility], please go directly to the [facility] and check in at the registration area.   Wear comfortable clothing and clothing appropriate for easy access to any Portacath or PICC line.   We strive to give you quality time with your provider. You may need to reschedule your appointment if you arrive late (15 or more minutes).  Arriving late affects you and other patients whose appointments are after yours.  Also, if you miss three or more appointments without notifying the office, you may be dismissed from the clinic at the provider's discretion.      For prescription refill requests, have your pharmacy contact our office and allow 72 hours for refills to be completed.    Today you received the following chemotherapy and/or immunotherapy agents Darzalex Faspro     To help prevent nausea and vomiting after your treatment, we encourage you to take your nausea medication as directed.  BELOW ARE SYMPTOMS THAT SHOULD BE REPORTED IMMEDIATELY: *FEVER GREATER THAN 100.4 F (38 C) OR HIGHER *CHILLS OR SWEATING *NAUSEA AND VOMITING THAT IS NOT CONTROLLED WITH YOUR NAUSEA MEDICATION *UNUSUAL SHORTNESS OF BREATH *UNUSUAL BRUISING OR BLEEDING *URINARY PROBLEMS (pain or burning when urinating, or frequent urination) *BOWEL PROBLEMS (unusual diarrhea, constipation, pain near the anus) TENDERNESS IN MOUTH AND THROAT WITH OR WITHOUT PRESENCE OF ULCERS (sore throat, sores in mouth, or a toothache) UNUSUAL RASH, SWELLING OR PAIN  UNUSUAL VAGINAL DISCHARGE OR ITCHING   Items with * indicate a potential emergency and should be followed up as soon as possible or go to the Emergency Department if any problems should occur.  Please show the CHEMOTHERAPY ALERT CARD or IMMUNOTHERAPY ALERT CARD at  check-in to the Emergency Department and triage nurse.  Should you have questions after your visit or need to cancel or reschedule your appointment, please contact [facility]  Dept: [phone]  and follow the prompts.  Office hours are 8:00 a.m. to 4:30 p.m. Monday - Friday. Please note that voicemails left after 4:00 p.m. may not be returned until the following business day.  We are closed weekends and major holidays. You have access to a nurse at all times for urgent questions. Please call the main number to the clinic Dept: [phone] and follow the prompts.   For any non-urgent questions, you may also contact your provider using MyChart. We now offer e-Visits for anyone 54 and older to request care online for non-urgent symptoms. For details visit [email or web address].   Also download the MyChart app! Go to the app store, search "MyChart", open the app, select [facility], and log in with your MyChart username and password.

## 2022-03-27 LAB — KAPPA/LAMBDA LIGHT CHAINS
Kappa free light chain: 11.1 mg/L (ref 3.3–19.4)
Kappa, lambda light chain ratio: 0.05 — ABNORMAL LOW (ref 0.26–1.65)
Lambda free light chains: 241.8 mg/L — ABNORMAL HIGH (ref 5.7–26.3)

## 2022-04-01 ENCOUNTER — Other Ambulatory Visit: Payer: Self-pay

## 2022-04-01 DIAGNOSIS — C9002 Multiple myeloma in relapse: Secondary | ICD-10-CM

## 2022-04-01 MED ORDER — POMALIDOMIDE 3 MG PO CAPS: 3.0000 mg | ORAL_CAPSULE | Freq: Every day | ORAL | 0 refills | Status: DC

## 2022-04-03 LAB — MULTIPLE MYELOMA PANEL, SERUM
Albumin SerPl Elph-Mcnc: 4 g/dL (ref 2.9–4.4)
Albumin/Glob SerPl: 1.9 — ABNORMAL HIGH (ref 0.7–1.7)
Alpha 1: 0.2 g/dL (ref 0.0–0.4)
Alpha2 Glob SerPl Elph-Mcnc: 0.6 g/dL (ref 0.4–1.0)
B-Globulin SerPl Elph-Mcnc: 0.8 g/dL (ref 0.7–1.3)
Gamma Glob SerPl Elph-Mcnc: 0.5 g/dL (ref 0.4–1.8)
Globulin, Total: 2.2 g/dL (ref 2.2–3.9)
IgA: 39 mg/dL — ABNORMAL LOW (ref 90–386)
IgG (Immunoglobin G), Serum: 624 mg/dL (ref 603–1613)
IgM (Immunoglobulin M), Srm: 52 mg/dL (ref 20–172)
M Protein SerPl Elph-Mcnc: 0.2 g/dL — ABNORMAL HIGH
Total Protein ELP: 6.2 g/dL (ref 6.0–8.5)

## 2022-04-09 ENCOUNTER — Inpatient Hospital Stay (HOSPITAL_BASED_OUTPATIENT_CLINIC_OR_DEPARTMENT_OTHER): Payer: No Typology Code available for payment source | Admitting: Hematology and Oncology

## 2022-04-09 ENCOUNTER — Other Ambulatory Visit: Payer: Self-pay

## 2022-04-09 ENCOUNTER — Inpatient Hospital Stay: Payer: No Typology Code available for payment source

## 2022-04-09 ENCOUNTER — Inpatient Hospital Stay: Payer: No Typology Code available for payment source | Attending: Internal Medicine

## 2022-04-09 ENCOUNTER — Encounter: Payer: Self-pay | Admitting: Hematology and Oncology

## 2022-04-09 VITALS — BP 153/95 | HR 71 | Temp 98.4°F | Resp 18

## 2022-04-09 VITALS — BP 153/93 | HR 70 | Temp 98.5°F | Resp 18 | Ht 74.0 in | Wt 255.8 lb

## 2022-04-09 DIAGNOSIS — D630 Anemia in neoplastic disease: Secondary | ICD-10-CM

## 2022-04-09 DIAGNOSIS — Z79899 Other long term (current) drug therapy: Secondary | ICD-10-CM | POA: Diagnosis not present

## 2022-04-09 DIAGNOSIS — Z5112 Encounter for antineoplastic immunotherapy: Secondary | ICD-10-CM | POA: Insufficient documentation

## 2022-04-09 DIAGNOSIS — C9002 Multiple myeloma in relapse: Secondary | ICD-10-CM | POA: Insufficient documentation

## 2022-04-09 LAB — CMP (CANCER CENTER ONLY)
ALT: 23 U/L (ref 0–44)
AST: 16 U/L (ref 15–41)
Albumin: 4.3 g/dL (ref 3.5–5.0)
Alkaline Phosphatase: 43 U/L (ref 38–126)
Anion gap: 4 — ABNORMAL LOW (ref 5–15)
BUN: 21 mg/dL — ABNORMAL HIGH (ref 6–20)
CO2: 28 mmol/L (ref 22–32)
Calcium: 9.7 mg/dL (ref 8.9–10.3)
Chloride: 105 mmol/L (ref 98–111)
Creatinine: 1.05 mg/dL (ref 0.61–1.24)
GFR, Estimated: >60 mL/min (ref >60)
Glucose, Bld: 90 mg/dL (ref 70–99)
Potassium: 4.2 mmol/L (ref 3.5–5.1)
Sodium: 137 mmol/L (ref 135–145)
Total Bilirubin: 0.6 mg/dL (ref 0.3–1.2)
Total Protein: 6.5 g/dL (ref 6.5–8.1)

## 2022-04-09 LAB — CBC WITH DIFFERENTIAL (CANCER CENTER ONLY)
Abs Immature Granulocytes: 0.01 10*3/uL (ref 0.00–0.07)
Basophils Absolute: 0.1 10*3/uL (ref 0.0–0.1)
Basophils Relative: 2 %
Eosinophils Absolute: 0.4 10*3/uL (ref 0.0–0.5)
Eosinophils Relative: 8 %
HCT: 37.6 % — ABNORMAL LOW (ref 39.0–52.0)
Hemoglobin: 12.9 g/dL — ABNORMAL LOW (ref 13.0–17.0)
Immature Granulocytes: 0 %
Lymphocytes Relative: 41 %
Lymphs Abs: 1.8 10*3/uL (ref 0.7–4.0)
MCH: 31.9 pg (ref 26.0–34.0)
MCHC: 34.3 g/dL (ref 30.0–36.0)
MCV: 92.8 fL (ref 80.0–100.0)
Monocytes Absolute: 0.6 10*3/uL (ref 0.1–1.0)
Monocytes Relative: 13 %
Neutro Abs: 1.6 10*3/uL — ABNORMAL LOW (ref 1.7–7.7)
Neutrophils Relative %: 36 %
Platelet Count: 182 10*3/uL (ref 150–400)
RBC: 4.05 MIL/uL — ABNORMAL LOW (ref 4.22–5.81)
RDW: 13.1 % (ref 11.5–15.5)
WBC Count: 4.4 10*3/uL (ref 4.0–10.5)
nRBC: 0 % (ref 0.0–0.2)

## 2022-04-09 MED ORDER — DARATUMUMAB-HYALURONIDASE-FIHJ 1800-30000 MG-UT/15ML ~~LOC~~ SOLN
1800.0000 mg | Freq: Once | SUBCUTANEOUS | Status: AC
  Administered 2022-04-09: 1800 mg via SUBCUTANEOUS
  Filled 2022-04-09: qty 15

## 2022-04-09 MED ORDER — DIPHENHYDRAMINE HCL 25 MG PO CAPS
25.0000 mg | ORAL_CAPSULE | Freq: Once | ORAL | Status: AC
  Administered 2022-04-09: 25 mg via ORAL
  Filled 2022-04-09: qty 1

## 2022-04-09 MED ORDER — FAMOTIDINE 20 MG PO TABS
40.0000 mg | ORAL_TABLET | Freq: Once | ORAL | Status: AC
  Administered 2022-04-09: 40 mg via ORAL
  Filled 2022-04-09: qty 2

## 2022-04-09 MED ORDER — ACETAMINOPHEN 325 MG PO TABS
650.0000 mg | ORAL_TABLET | Freq: Once | ORAL | Status: AC
  Administered 2022-04-09: 650 mg via ORAL
  Filled 2022-04-09: qty 2

## 2022-04-09 NOTE — Patient Instructions (Signed)
[facility]  Discharge Instructions: Thank you for choosing [facility] to provide your oncology and hematology care.   If you have a lab appointment with the Cancer Center, please go directly to the Cancer Center and check in at the registration area.   Wear comfortable clothing and clothing appropriate for easy access to any Portacath or PICC line.   We strive to give you quality time with your provider. You may need to reschedule your appointment if you arrive late (15 or more minutes).  Arriving late affects you and other patients whose appointments are after yours.  Also, if you miss three or more appointments without notifying the office, you may be dismissed from the clinic at the provider's discretion.      For prescription refill requests, have your pharmacy contact our office and allow 72 hours for refills to be completed.    Today you received the following chemotherapy and/or immunotherapy agents Darzalex Faspro      To help prevent nausea and vomiting after your treatment, we encourage you to take your nausea medication as directed.  BELOW ARE SYMPTOMS THAT SHOULD BE REPORTED IMMEDIATELY: *FEVER GREATER THAN 100.4 F (38 C) OR HIGHER *CHILLS OR SWEATING *NAUSEA AND VOMITING THAT IS NOT CONTROLLED WITH YOUR NAUSEA MEDICATION *UNUSUAL SHORTNESS OF BREATH *UNUSUAL BRUISING OR BLEEDING *URINARY PROBLEMS (pain or burning when urinating, or frequent urination) *BOWEL PROBLEMS (unusual diarrhea, constipation, pain near the anus) TENDERNESS IN MOUTH AND THROAT WITH OR WITHOUT PRESENCE OF ULCERS (sore throat, sores in mouth, or a toothache) UNUSUAL RASH, SWELLING OR PAIN  UNUSUAL VAGINAL DISCHARGE OR ITCHING   Items with * indicate a potential emergency and should be followed up as soon as possible or go to the Emergency Department if any problems should occur.  Please show the CHEMOTHERAPY ALERT CARD or IMMUNOTHERAPY ALERT CARD at  check-in to the Emergency Department and triage nurse.  Should you have questions after your visit or need to cancel or reschedule your appointment, please contact [facility]  Dept: [phone]  and follow the prompts.  Office hours are 8:00 a.m. to 4:30 p.m. Monday - Friday. Please note that voicemails left after 4:00 p.m. may not be returned until the following business day.  We are closed weekends and major holidays. You have access to a nurse at all times for urgent questions. Please call the main number to the clinic Dept: [phone] and follow the prompts.   For any non-urgent questions, you may also contact your provider using MyChart. We now offer e-Visits for anyone 18 and older to request care online for non-urgent symptoms. For details visit [email or web address].   Also download the MyChart app! Go to the app store, search "MyChart", open the app, select [facility], and log in with your MyChart username and password.  

## 2022-04-09 NOTE — Assessment & Plan Note (Signed)
This is likely anemia of chronic disease. The patient denies recent history of bleeding such as epistaxis, hematuria or hematochezia. He is asymptomatic from the anemia. We will observe for now.  He does not require transfusion now. I do not recommend any further work-up at this time.   

## 2022-04-09 NOTE — Progress Notes (Signed)
[place] OFFICE PROGRESS NOTE  Patient Care Team: [name], MD as PCP - General (Internal Medicine)  ASSESSMENT & PLAN:  Multiple myeloma in relapse [facility]) I reviewed recent myeloma panel with the patient His last set of serum light chain was better We discussed the risk and benefits of increasing the dose of Pomalyst versus adding Velcade For now, he would like to continue his Pomalyst as prescribed We will follow and trend his myeloma panel for the next month If his serum light chains are not improving, we might have to switch him to a different treatment and he is in agreement He will continue aspirin for DVT prophylaxis He will continue acyclovir for antimicrobial prophylaxis He will continue calcium with vitamin D supplement He is not receiving bisphosphonate due to inability to get dental clearance yet  Anemia in neoplastic disease This is likely anemia of chronic disease. The patient denies recent history of bleeding such as epistaxis, hematuria or hematochezia. He is asymptomatic from the anemia. We will observe for now.  He does not require transfusion now. I do not recommend any further work-up at this time.    No orders of the defined types were placed in this encounter.   All questions were answered. The patient knows to call the clinic with any problems, questions or concerns. The total time spent in the appointment was 20 minutes encounter with patients including review of chart and various tests results, discussions about plan of care and coordination of care plan   [name], MD 04/09/2022 11:48 AM  INTERVAL HISTORY: Please see below for problem oriented charting. he returns for treatment follow-up on Pomalyst, dexamethasone and daratumumab He tolerated recent treatment well We spent majority of our time reviewing test results and discussed treatment options  REVIEW OF SYSTEMS:   Constitutional: Denies fevers, chills or abnormal weight  loss Eyes: Denies blurriness of vision Ears, nose, mouth, throat, and face: Denies mucositis or sore throat Respiratory: Denies cough, dyspnea or wheezes Cardiovascular: Denies palpitation, chest discomfort or lower extremity swelling Gastrointestinal:  Denies nausea, heartburn or change in bowel habits Skin: Denies abnormal skin rashes Lymphatics: Denies new lymphadenopathy or easy bruising Neurological:Denies numbness, tingling or new weaknesses Behavioral/Psych: Mood is stable, no new changes  All other systems were reviewed with the patient and are negative.  I have reviewed the past medical history, past surgical history, social history and family history with the patient and they are unchanged from previous note.  ALLERGIES:  is allergic to daratumumab and heparin.  MEDICATIONS:  Current Outpatient Medications  Medication Sig Dispense Refill   acyclovir (ZOVIRAX) 400 MG tablet Take 1 tablet (400 mg total) by mouth 2 (two) times daily. 60 tablet 11   aspirin EC 81 MG tablet Take 81 mg by mouth daily. Swallow whole.     calcium carbonate (TUMS - DOSED IN MG ELEMENTAL CALCIUM) 500 MG chewable tablet Chew 1 tablet by mouth 3 (three) times daily.     cholecalciferol (VITAMIN D3) 25 MCG (1000 UT) tablet Take 1,000 Units by mouth daily.     dexamethasone (DECADRON) 4 MG tablet Take 5 tablets (20 mg total) by mouth once a week. Take weekly on Tuesday mornings with food 20 tablet 11   ondansetron (ZOFRAN) 8 MG tablet Take 1 tablet (8 mg total) by mouth every 8 (eight) hours as needed for nausea or vomiting. 30 tablet 1   pomalidomide (POMALYST) 3 MG capsule Take 1 capsule (3 mg total) by mouth  daily. Take for 21 days on, 7 days off, repeat every 28 days 21 capsule 0   prochlorperazine (COMPAZINE) 10 MG tablet Take 1 tablet (10 mg total) by mouth every 6 (six) hours as needed for nausea or vomiting. 30 tablet 1   No current facility-administered medications for this visit.    SUMMARY OF  ONCOLOGIC HISTORY: Oncology History  Multiple myeloma in relapse [facility])  06/23/2015 - 06/28/2015 Hospital Admission   The patient was admitted to the hospital due to gait ataxia and back pain. He was subsequently found to have cord compression underwent surgery and was discharged home   06/24/2015 Imaging   Abnormal appearance of the T6 vertebral body, highly suspicious for possible osseous metastasis. Associated pathologic fracture withup to 30% height loss. There is associated abnormal soft tissue density within the ventral epidural space,   06/24/2015 Imaging   MRI lumbar: Focal osseous lesion with abnormal enhancement involving the right pedicle of L3, suspicious for possible osseous metastasisgiven the findings in the thoracic spine. Question additional focal lesion within the right iliac wing as above.     06/25/2015 Pathology Results   Accession: [id number] bone biopsy come from plasma cell neoplasm.   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 Zometa is not given due to inability to get dental clearance   12/14/2015 - 04/24/2016 Chemotherapy   He is started on maintenance treatment with Revlimid only   12/18/2015 Imaging   MRI thoracic and lumbar spine showed numerous enhancing foci throughout the thoracic and lumbar spine with several new small foci in the lumbar spine in comparison with prior MRI compatible with metastatic disease. Stable loss of height of the T3, T4, and T6 vertebral bodies and new postsurgical changes related to T6 laminectomy. No significant epidural disease or evidence for cord compression. No abnormal enhancement of the spinal cord or cauda equina.   05/02/2016 Bone Marrow Biopsy    Outside bone marrow biopsy showed 20% myeloma involvement   05/16/2016 Procedure   Successful placement of a right internal jugular approach power injectable Port-A-Cath. The catheter is ready for immediate use.   05/21/2016 - 07/03/2016 Chemotherapy   He received Kyprolis, Cytoxan and dexamethasone    07/08/2016 Procedure   Status post CT-guided bone marrow biopsy, with tissue specimen sent to pathology for complete histopathologic analysis   07/08/2016 Bone Marrow Biopsy   Bone Marrow, Aspirate,Biopsy, and Clot BONE MARROW: - MILDLY HYPERCELLULAR MARROW (60%) WITH PLASMA CELL NEOPLASM - SEE COMMENT PERIPHERAL BLOOD: - NORMOCYTIC ANEMIA Diagnosis Note The marrow is hypercellular with lambda-restricted plasma cells consistent with persistence of the patient's previously diagnosed plasma cell neoplasm. The plasma cells comprise approximately 10-15% of the total marrow cellularity, are enlarged, and arranged in clusters.   08/14/2016 Miscellaneous   He received conditioning treatment with melphalan   08/15/2016 Bone Marrow Transplant   He received autologous stem cell transplant   08/24/2016 - 08/29/2016 Hospital Admission   His post-transplant course was complicated by E-Coli bacteremia   11/26/2016 PET scan   PET CT at [facility] 1. Technically limited study due to soft tissue uptake. 2. New hypermetabolic uptake at C7 spinous process and left proximal femur that is of questionable significance in absence of underlying CT correlate. Further  assessment with whole body bone scan may be considered. 2. Redemonstrated nonhypermetabolic multifocal lucent and sclerotic lesions throughout the spine which are similar to prior.    12/02/2016 Bone Marrow Biopsy   He had repeat bone marrow biopsy at [facility] An immunohistochemical stain for CD138 is performed on the bone marrow core biopsy demonstrates increased plasma cells with focal clustering (10-20% overall), which are monotypic for lambda light chain by  in situ hybridization.   01/06/2017 - 06/09/2017 Chemotherapy   He received weekly Dexamethasone, Pomalyst days 1-21 and Daratumumab. From 06/08/17 onwards, he is placed on maintenance Pomalyst only.  He self discontinue Pomalyst in February 2021   07/23/2017 Procedure   Successful right IJ vein Port-A-Cath explant.   03/22/2021 Imaging   1. New large disc protrusion at L4-5 with severe spinal stenosis. 2. History of multiple myeloma with largely resolved diffuse bone marrow heterogeneity since 2017. New enhancing lesions in the L4 and L5 vertebral bodies without acute fracture. 3. Chronic thoracic compression fractures including severe T6 vertebral body height loss which has progressed from 2017. Resolved epidural tumor.     11/26/2021 PET scan   1. Hypermetabolic patchy lytic L5 vertebral lesion compatible with metabolically active multiple myeloma. 2. No additional sites of hypermetabolic skeletal or extraskeletal myeloma. 3. Mild colonic diverticulosis.   11/27/2021 - 11/27/2021 Chemotherapy   Patient is on Treatment Plan : MYELOMA Daratumumab IV + Pomalidomide + Dexamethasone q28d x 7 cycles     11/27/2021 -  Chemotherapy   Patient is on Treatment Plan : MYELOMA RELAPSED REFRACTORY Daratumumab SQ + Pomalidomide + Dexamethasone (DaraPd) q28d       PHYSICAL EXAMINATION: ECOG PERFORMANCE STATUS: 0 - Asymptomatic  Vitals:   04/09/22 0902  BP: (!) 153/93  Pulse: 70  Resp: 18  Temp: 98.5 F (36.9 C)  SpO2: 100%   Filed Weights   04/09/22 0902  Weight: 255 lb 12.8 oz (116 kg)    GENERAL:alert, no distress and comfortable  NEURO: alert & oriented x 3 with fluent speech, no focal motor/sensory deficits  LABORATORY DATA:  I have reviewed the data as listed    Component Value Date/Time   NA 137 04/09/2022 0832   NA 139 01/27/2017 0809   K 4.2 04/09/2022 0832   K 4.1 01/27/2017 0809   CL 105 04/09/2022 0832   CO2 28 04/09/2022 0832   CO2 23 01/27/2017 0809   GLUCOSE 90  04/09/2022 0832   GLUCOSE 122 01/27/2017 0809   BUN 21 (H) 04/09/2022 0832   BUN 14.3 01/27/2017 0809   CREATININE 1.05 04/09/2022 0832   CREATININE 0.9 01/27/2017 0809   CALCIUM 9.7 04/09/2022 0832   CALCIUM 9.1 01/27/2017 0809   PROT 6.5 04/09/2022 0832   PROT 6.2 (L) 01/27/2017 0809   ALBUMIN 4.3 04/09/2022 0832   ALBUMIN 3.7 01/27/2017 0809   AST 16 04/09/2022 0832   AST 10 01/27/2017 0809   ALT 23 04/09/2022 0832   ALT 18 01/27/2017 0809   ALKPHOS 43 04/09/2022 0832   ALKPHOS 42 01/27/2017 0809   BILITOT 0.6 04/09/2022 0832   BILITOT 0.72 01/27/2017 0809   GFRNONAA >60 04/09/2022 0832   GFRAA >60 10/18/2019 1017    No results found for: "SPEP", "UPEP"  Lab Results  Component Value Date   WBC 4.4 04/09/2022   NEUTROABS 1.6 (L) 04/09/2022   HGB 12.9 (L) 04/09/2022   HCT 37.6 (L) 04/09/2022   MCV 92.8 04/09/2022   PLT 182 04/09/2022  Chemistry      Component Value Date/Time   NA 137 04/09/2022 0832   NA 139 01/27/2017 0809   K 4.2 04/09/2022 0832   K 4.1 01/27/2017 0809   CL 105 04/09/2022 0832   CO2 28 04/09/2022 0832   CO2 23 01/27/2017 0809   BUN 21 (H) 04/09/2022 0832   BUN 14.3 01/27/2017 0809   CREATININE 1.05 04/09/2022 0832   CREATININE 0.9 01/27/2017 0809      Component Value Date/Time   CALCIUM 9.7 04/09/2022 0832   CALCIUM 9.1 01/27/2017 0809   ALKPHOS 43 04/09/2022 0832   ALKPHOS 42 01/27/2017 0809   AST 16 04/09/2022 0832   AST 10 01/27/2017 0809   ALT 23 04/09/2022 0832   ALT 18 01/27/2017 0809   BILITOT 0.6 04/09/2022 0832   BILITOT 0.72 01/27/2017 0809

## 2022-04-09 NOTE — Progress Notes (Signed)
Patient took his steroid at about 0700 this morning.

## 2022-04-09 NOTE — Assessment & Plan Note (Signed)
I reviewed recent myeloma panel with the patient His last set of serum light chain was better We discussed the risk and benefits of increasing the dose of Pomalyst versus adding Velcade For now, he would like to continue his Pomalyst as prescribed We will follow and trend his myeloma panel for the next month If his serum light chains are not improving, we might have to switch him to a different treatment and he is in agreement He will continue aspirin for DVT prophylaxis He will continue acyclovir for antimicrobial prophylaxis He will continue calcium with vitamin D supplement He is not receiving bisphosphonate due to inability to get dental clearance yet

## 2022-04-23 ENCOUNTER — Other Ambulatory Visit: Payer: Self-pay

## 2022-04-23 ENCOUNTER — Inpatient Hospital Stay: Payer: No Typology Code available for payment source

## 2022-04-23 VITALS — BP 160/92 | HR 75 | Temp 98.7°F | Resp 18

## 2022-04-23 DIAGNOSIS — Z5112 Encounter for antineoplastic immunotherapy: Secondary | ICD-10-CM | POA: Diagnosis not present

## 2022-04-23 DIAGNOSIS — C9002 Multiple myeloma in relapse: Secondary | ICD-10-CM

## 2022-04-23 LAB — CMP (CANCER CENTER ONLY)
ALT: 27 U/L (ref 0–44)
AST: 18 U/L (ref 15–41)
Albumin: 4.2 g/dL (ref 3.5–5.0)
Alkaline Phosphatase: 43 U/L (ref 38–126)
Anion gap: 7 (ref 5–15)
BUN: 21 mg/dL — ABNORMAL HIGH (ref 6–20)
CO2: 24 mmol/L (ref 22–32)
Calcium: 9.5 mg/dL (ref 8.9–10.3)
Chloride: 105 mmol/L (ref 98–111)
Creatinine: 1.09 mg/dL (ref 0.61–1.24)
GFR, Estimated: >60 mL/min (ref >60)
Glucose, Bld: 165 mg/dL — ABNORMAL HIGH (ref 70–99)
Potassium: 3.9 mmol/L (ref 3.5–5.1)
Sodium: 136 mmol/L (ref 135–145)
Total Bilirubin: 0.5 mg/dL (ref 0.3–1.2)
Total Protein: 6.1 g/dL — ABNORMAL LOW (ref 6.5–8.1)

## 2022-04-23 LAB — CBC WITH DIFFERENTIAL (CANCER CENTER ONLY)
Abs Immature Granulocytes: 0.01 10*3/uL (ref 0.00–0.07)
Basophils Absolute: 0.1 10*3/uL (ref 0.0–0.1)
Basophils Relative: 2 %
Eosinophils Absolute: 0.3 10*3/uL (ref 0.0–0.5)
Eosinophils Relative: 9 %
HCT: 35.9 % — ABNORMAL LOW (ref 39.0–52.0)
Hemoglobin: 12.7 g/dL — ABNORMAL LOW (ref 13.0–17.0)
Immature Granulocytes: 0 %
Lymphocytes Relative: 35 %
Lymphs Abs: 1.4 10*3/uL (ref 0.7–4.0)
MCH: 32.1 pg (ref 26.0–34.0)
MCHC: 35.4 g/dL (ref 30.0–36.0)
MCV: 90.7 fL (ref 80.0–100.0)
Monocytes Absolute: 0.3 10*3/uL (ref 0.1–1.0)
Monocytes Relative: 8 %
Neutro Abs: 1.8 10*3/uL (ref 1.7–7.7)
Neutrophils Relative %: 46 %
Platelet Count: 211 10*3/uL (ref 150–400)
RBC: 3.96 MIL/uL — ABNORMAL LOW (ref 4.22–5.81)
RDW: 12.8 % (ref 11.5–15.5)
WBC Count: 3.8 10*3/uL — ABNORMAL LOW (ref 4.0–10.5)
nRBC: 0 % (ref 0.0–0.2)

## 2022-04-23 MED ORDER — DIPHENHYDRAMINE HCL 25 MG PO CAPS
25.0000 mg | ORAL_CAPSULE | Freq: Once | ORAL | Status: AC
  Administered 2022-04-23: 25 mg via ORAL
  Filled 2022-04-23: qty 1

## 2022-04-23 MED ORDER — FAMOTIDINE 20 MG PO TABS
40.0000 mg | ORAL_TABLET | Freq: Once | ORAL | Status: AC
  Administered 2022-04-23: 40 mg via ORAL
  Filled 2022-04-23: qty 2

## 2022-04-23 MED ORDER — DARATUMUMAB-HYALURONIDASE-FIHJ 1800-30000 MG-UT/15ML ~~LOC~~ SOLN
1800.0000 mg | Freq: Once | SUBCUTANEOUS | Status: AC
  Administered 2022-04-23: 1800 mg via SUBCUTANEOUS
  Filled 2022-04-23: qty 15

## 2022-04-23 MED ORDER — ACETAMINOPHEN 325 MG PO TABS
650.0000 mg | ORAL_TABLET | Freq: Once | ORAL | Status: AC
  Administered 2022-04-23: 650 mg via ORAL
  Filled 2022-04-23: qty 2

## 2022-04-23 NOTE — Patient Instructions (Signed)
[facility]  Discharge Instructions: Thank you for choosing [facility] to provide your oncology and hematology care.   If you have a lab appointment with the Cancer Center, please go directly to the Cancer Center and check in at the registration area.   Wear comfortable clothing and clothing appropriate for easy access to any Portacath or PICC line.   We strive to give you quality time with your provider. You may need to reschedule your appointment if you arrive late (15 or more minutes).  Arriving late affects you and other patients whose appointments are after yours.  Also, if you miss three or more appointments without notifying the office, you may be dismissed from the clinic at the provider's discretion.      For prescription refill requests, have your pharmacy contact our office and allow 72 hours for refills to be completed.    Today you received the following chemotherapy and/or immunotherapy agents: daratumumab-hyaluronidase-fihj      To help prevent nausea and vomiting after your treatment, we encourage you to take your nausea medication as directed.  BELOW ARE SYMPTOMS THAT SHOULD BE REPORTED IMMEDIATELY: *FEVER GREATER THAN 100.4 F (38 C) OR HIGHER *CHILLS OR SWEATING *NAUSEA AND VOMITING THAT IS NOT CONTROLLED WITH YOUR NAUSEA MEDICATION *UNUSUAL SHORTNESS OF BREATH *UNUSUAL BRUISING OR BLEEDING *URINARY PROBLEMS (pain or burning when urinating, or frequent urination) *BOWEL PROBLEMS (unusual diarrhea, constipation, pain near the anus) TENDERNESS IN MOUTH AND THROAT WITH OR WITHOUT PRESENCE OF ULCERS (sore throat, sores in mouth, or a toothache) UNUSUAL RASH, SWELLING OR PAIN  UNUSUAL VAGINAL DISCHARGE OR ITCHING   Items with * indicate a potential emergency and should be followed up as soon as possible or go to the Emergency Department if any problems should occur.  Please show the CHEMOTHERAPY ALERT CARD or  IMMUNOTHERAPY ALERT CARD at check-in to the Emergency Department and triage nurse.  Should you have questions after your visit or need to cancel or reschedule your appointment, please contact [facility]  Dept: [phone]  and follow the prompts.  Office hours are 8:00 a.m. to 4:30 p.m. Monday - Friday. Please note that voicemails left after 4:00 p.m. may not be returned until the following business day.  We are closed weekends and major holidays. You have access to a nurse at all times for urgent questions. Please call the main number to the clinic Dept: [phone] and follow the prompts.   For any non-urgent questions, you may also contact your provider using MyChart. We now offer e-Visits for anyone 18 and older to request care online for non-urgent symptoms. For details visit [email or web address].   Also download the MyChart app! Go to the app store, search "MyChart", open the app, select [place], and log in with your MyChart username and password.   

## 2022-04-23 NOTE — Progress Notes (Signed)
Patient verified that he took his dexamethasone at home.

## 2022-04-24 LAB — KAPPA/LAMBDA LIGHT CHAINS
Kappa free light chain: 10.5 mg/L (ref 3.3–19.4)
Kappa, lambda light chain ratio: 0.04 — ABNORMAL LOW (ref 0.26–1.65)
Lambda free light chains: 253.9 mg/L — ABNORMAL HIGH (ref 5.7–26.3)

## 2022-04-29 LAB — MULTIPLE MYELOMA PANEL, SERUM
Albumin SerPl Elph-Mcnc: 4 g/dL (ref 2.9–4.4)
Albumin/Glob SerPl: 2 — ABNORMAL HIGH (ref 0.7–1.7)
Alpha 1: 0.2 g/dL (ref 0.0–0.4)
Alpha2 Glob SerPl Elph-Mcnc: 0.6 g/dL (ref 0.4–1.0)
B-Globulin SerPl Elph-Mcnc: 0.8 g/dL (ref 0.7–1.3)
Gamma Glob SerPl Elph-Mcnc: 0.6 g/dL (ref 0.4–1.8)
Globulin, Total: 2.1 g/dL — ABNORMAL LOW (ref 2.2–3.9)
IgA: 41 mg/dL — ABNORMAL LOW (ref 90–386)
IgG (Immunoglobin G), Serum: 644 mg/dL (ref 603–1613)
IgM (Immunoglobulin M), Srm: 34 mg/dL (ref 20–172)
M Protein SerPl Elph-Mcnc: 0.2 g/dL — ABNORMAL HIGH
Total Protein ELP: 6.1 g/dL (ref 6.0–8.5)

## 2022-05-07 ENCOUNTER — Telehealth: Payer: Self-pay

## 2022-05-07 ENCOUNTER — Other Ambulatory Visit: Payer: Self-pay | Admitting: Hematology and Oncology

## 2022-05-07 ENCOUNTER — Inpatient Hospital Stay: Payer: No Typology Code available for payment source | Attending: Internal Medicine

## 2022-05-07 ENCOUNTER — Inpatient Hospital Stay: Payer: No Typology Code available for payment source

## 2022-05-07 ENCOUNTER — Other Ambulatory Visit: Payer: Self-pay

## 2022-05-07 ENCOUNTER — Telehealth: Payer: Self-pay | Admitting: Hematology and Oncology

## 2022-05-07 ENCOUNTER — Other Ambulatory Visit (HOSPITAL_COMMUNITY): Payer: Self-pay

## 2022-05-07 ENCOUNTER — Encounter: Payer: Self-pay | Admitting: Hematology and Oncology

## 2022-05-07 ENCOUNTER — Telehealth: Payer: Self-pay | Admitting: Pharmacy Technician

## 2022-05-07 ENCOUNTER — Inpatient Hospital Stay (HOSPITAL_BASED_OUTPATIENT_CLINIC_OR_DEPARTMENT_OTHER): Payer: No Typology Code available for payment source | Admitting: Hematology and Oncology

## 2022-05-07 VITALS — BP 163/94 | HR 65 | Temp 98.1°F | Resp 20

## 2022-05-07 VITALS — BP 153/87 | HR 60 | Temp 98.0°F | Resp 18 | Ht 74.0 in | Wt 254.2 lb

## 2022-05-07 DIAGNOSIS — C9002 Multiple myeloma in relapse: Secondary | ICD-10-CM | POA: Diagnosis not present

## 2022-05-07 DIAGNOSIS — Z5112 Encounter for antineoplastic immunotherapy: Secondary | ICD-10-CM | POA: Diagnosis not present

## 2022-05-07 DIAGNOSIS — Z79899 Other long term (current) drug therapy: Secondary | ICD-10-CM | POA: Insufficient documentation

## 2022-05-07 DIAGNOSIS — D61818 Other pancytopenia: Secondary | ICD-10-CM | POA: Diagnosis not present

## 2022-05-07 LAB — CBC WITH DIFFERENTIAL (CANCER CENTER ONLY)
Abs Immature Granulocytes: 0 10*3/uL (ref 0.00–0.07)
Basophils Absolute: 0.1 10*3/uL (ref 0.0–0.1)
Basophils Relative: 2 %
Eosinophils Absolute: 0.4 10*3/uL (ref 0.0–0.5)
Eosinophils Relative: 11 %
HCT: 37.4 % — ABNORMAL LOW (ref 39.0–52.0)
Hemoglobin: 13 g/dL (ref 13.0–17.0)
Immature Granulocytes: 0 %
Lymphocytes Relative: 46 %
Lymphs Abs: 1.8 10*3/uL (ref 0.7–4.0)
MCH: 32 pg (ref 26.0–34.0)
MCHC: 34.8 g/dL (ref 30.0–36.0)
MCV: 92.1 fL (ref 80.0–100.0)
Monocytes Absolute: 0.6 10*3/uL (ref 0.1–1.0)
Monocytes Relative: 16 %
Neutro Abs: 1 10*3/uL — ABNORMAL LOW (ref 1.7–7.7)
Neutrophils Relative %: 25 %
Platelet Count: 170 10*3/uL (ref 150–400)
RBC: 4.06 MIL/uL — ABNORMAL LOW (ref 4.22–5.81)
RDW: 12.9 % (ref 11.5–15.5)
WBC Count: 3.9 10*3/uL — ABNORMAL LOW (ref 4.0–10.5)
nRBC: 0 % (ref 0.0–0.2)

## 2022-05-07 LAB — CMP (CANCER CENTER ONLY)
ALT: 28 U/L (ref 0–44)
AST: 19 U/L (ref 15–41)
Albumin: 4.3 g/dL (ref 3.5–5.0)
Alkaline Phosphatase: 42 U/L (ref 38–126)
Anion gap: 4 — ABNORMAL LOW (ref 5–15)
BUN: 17 mg/dL (ref 6–20)
CO2: 29 mmol/L (ref 22–32)
Calcium: 10 mg/dL (ref 8.9–10.3)
Chloride: 105 mmol/L (ref 98–111)
Creatinine: 1.12 mg/dL (ref 0.61–1.24)
GFR, Estimated: >60 mL/min (ref >60)
Glucose, Bld: 100 mg/dL — ABNORMAL HIGH (ref 70–99)
Potassium: 4.2 mmol/L (ref 3.5–5.1)
Sodium: 138 mmol/L (ref 135–145)
Total Bilirubin: 0.5 mg/dL (ref 0.3–1.2)
Total Protein: 6.6 g/dL (ref 6.5–8.1)

## 2022-05-07 MED ORDER — FAMOTIDINE 20 MG PO TABS
40.0000 mg | ORAL_TABLET | Freq: Once | ORAL | Status: AC
  Administered 2022-05-07: 40 mg via ORAL
  Filled 2022-05-07: qty 2

## 2022-05-07 MED ORDER — LENALIDOMIDE 25 MG PO CAPS: ORAL_CAPSULE | ORAL | 0 refills | Status: DC

## 2022-05-07 MED ORDER — DIPHENHYDRAMINE HCL 25 MG PO CAPS
25.0000 mg | ORAL_CAPSULE | Freq: Once | ORAL | Status: AC
  Administered 2022-05-07: 25 mg via ORAL
  Filled 2022-05-07: qty 1

## 2022-05-07 MED ORDER — DEXAMETHASONE 4 MG PO TABS
20.0000 mg | ORAL_TABLET | ORAL | 11 refills | Status: DC
Start: 2022-05-07 — End: 2022-05-20

## 2022-05-07 MED ORDER — DARATUMUMAB-HYALURONIDASE-FIHJ 1800-30000 MG-UT/15ML ~~LOC~~ SOLN
1800.0000 mg | Freq: Once | SUBCUTANEOUS | Status: AC
  Administered 2022-05-07: 1800 mg via SUBCUTANEOUS
  Filled 2022-05-07: qty 15

## 2022-05-07 MED ORDER — ACETAMINOPHEN 325 MG PO TABS
650.0000 mg | ORAL_TABLET | Freq: Once | ORAL | Status: AC
  Administered 2022-05-07: 650 mg via ORAL
  Filled 2022-05-07: qty 2

## 2022-05-07 NOTE — Assessment & Plan Note (Signed)
He has intermittent mild pancytopenia due to treatment He is not symptomatic We will proceed with treatment without delay

## 2022-05-07 NOTE — Telephone Encounter (Signed)
Oral Oncology Pharmacist Encounter  Received new prescription for Revlimid for the treatment of multiply myeloma in relapse in conjunction with bortezomib and dexamethasone, planned duration until progression or unacceptable toxicity occurs.  Labs from 05/07/22 assessed, appropriate to initiate treatment. No interventions needed. Prescription dose and frequency assessed for appropriateness. Planned initiation on 05/20/22.   Current medication list in Epic reviewed, no DDIs identified.  Evaluated chart and no patient barriers to medication adherence noted.   Patient agreement for treatment documented in MD note on 05/07/22.  Prescription has been e-scribed to the [facility] for benefits analysis and approval.  Oral Oncology Clinic will continue to follow for insurance authorization, copayment issues, initial counseling and start date.  Lennie Muckle, PharmD Candidate 05/07/2022 1:09 PM Oral Oncology Clinic [phone]

## 2022-05-07 NOTE — Progress Notes (Signed)
Per Bertis Ruddy, MD okay to treat with Neutrophils 1  Per patient, took 20mg  Decadron at home prior to appointment.

## 2022-05-07 NOTE — Progress Notes (Signed)
DISCONTINUE ON PATHWAY REGIMEN - Multiple Myeloma and Other Plasma Cell Dyscrasias     A cycle is every 28 days:     Daratumumab      Pomalidomide      Dexamethasone      Dexamethasone   **Always confirm dose/schedule in your pharmacy ordering system**  REASON: Other Reason PRIOR TREATMENT: [id number]: DaraPd (Daratumumab + Pomalidomide + Dexamethasone) Until Progression or Unacceptable Toxicity TREATMENT RESPONSE: Stable Disease (SD)  START ON PATHWAY REGIMEN - Multiple Myeloma and Other Plasma Cell Dyscrasias     A cycle is every 21 days:     Bortezomib      Lenalidomide      Dexamethasone   **Always confirm dose/schedule in your pharmacy ordering system**  Patient Characteristics: Multiple Myeloma, Relapsed / Refractory, Second through Fourth Lines of Therapy, Fit or Candidate for Triplet Therapy, Not Lenalidomide-Refractory and Lenalidomide-based Regimen Preferred, Not a Candidate for Anti-CD38 Antibody Disease Classification: Multiple Myeloma R-ISS Staging: II Therapeutic Status: Relapsed Line of Therapy: Third Line Anti-CD38 Antibody Candidacy: Not a Candidate for Anti-CD38 Antibody Lenalidomide-based Regimen Preference/Candidacy: Not Lenalidomide-Refractory and Lenalidomide-based Regimen Preferred Intent of Therapy: Non-Curative / Palliative Intent, Discussed with Patient

## 2022-05-07 NOTE — Patient Instructions (Signed)
[facility]  Discharge Instructions: Thank you for choosing [facility] to provide your oncology and hematology care.   If you have a lab appointment with the Cancer Center, please go directly to the Cancer Center and check in at the registration area.   Wear comfortable clothing and clothing appropriate for easy access to any Portacath or PICC line.   We strive to give you quality time with your provider. You may need to reschedule your appointment if you arrive late (15 or more minutes).  Arriving late affects you and other patients whose appointments are after yours.  Also, if you miss three or more appointments without notifying the office, you may be dismissed from the clinic at the provider's discretion.      For prescription refill requests, have your pharmacy contact our office and allow 72 hours for refills to be completed.    Today you received the following chemotherapy and/or immunotherapy agents: daratumumab-hyaluronidase-fihj      To help prevent nausea and vomiting after your treatment, we encourage you to take your nausea medication as directed.  BELOW ARE SYMPTOMS THAT SHOULD BE REPORTED IMMEDIATELY: *FEVER GREATER THAN 100.4 F (38 C) OR HIGHER *CHILLS OR SWEATING *NAUSEA AND VOMITING THAT IS NOT CONTROLLED WITH YOUR NAUSEA MEDICATION *UNUSUAL SHORTNESS OF BREATH *UNUSUAL BRUISING OR BLEEDING *URINARY PROBLEMS (pain or burning when urinating, or frequent urination) *BOWEL PROBLEMS (unusual diarrhea, constipation, pain near the anus) TENDERNESS IN MOUTH AND THROAT WITH OR WITHOUT PRESENCE OF ULCERS (sore throat, sores in mouth, or a toothache) UNUSUAL RASH, SWELLING OR PAIN  UNUSUAL VAGINAL DISCHARGE OR ITCHING   Items with * indicate a potential emergency and should be followed up as soon as possible or go to the Emergency Department if any problems should occur.  Please show the CHEMOTHERAPY ALERT CARD or  IMMUNOTHERAPY ALERT CARD at check-in to the Emergency Department and triage nurse.  Should you have questions after your visit or need to cancel or reschedule your appointment, please contact [facility]  Dept: [phone]  and follow the prompts.  Office hours are 8:00 a.m. to 4:30 p.m. Monday - Friday. Please note that voicemails left after 4:00 p.m. may not be returned until the following business day.  We are closed weekends and major holidays. You have access to a nurse at all times for urgent questions. Please call the main number to the clinic Dept: [phone] and follow the prompts.   For any non-urgent questions, you may also contact your provider using MyChart. We now offer e-Visits for anyone 18 and older to request care online for non-urgent symptoms. For details visit [email or web address].   Also download the MyChart app! Go to the app store, search "MyChart", open the app, select [place], and log in with your MyChart username and password.   

## 2022-05-07 NOTE — Assessment & Plan Note (Signed)
I have reviewed his myeloma panel for the last 6 months The patient has stable disease control only with minimal response I reviewed his prior treatment The patient has no major signs of neuropathy We discussed several different treatment options Ultimately, he is in agreement after today to switch his treatment to combination of bortezomib, Revlimid and dexamethasone Due to significant heartburn, I will reduce the dexamethasone to last dose per week I will modify his regimen to weekly bortezomib to reduce his risk of peripheral neuropathy He will continue acyclovir, calcium and vitamin D He is not able to get Zometa due to inability to get dental clearance He will continue aspirin for DVT prophylaxis I will try to get him started on this new treatment on April 22

## 2022-05-07 NOTE — Telephone Encounter (Signed)
Spoke with patient confirming upcoming appointments  

## 2022-05-07 NOTE — Telephone Encounter (Signed)
Oral Oncology Patient Advocate Encounter  Prior Authorization for lenalidomide has been approved.    PA# [id number] Effective dates: 05/07/22 through 05/06/25  Patients co-pay is $0.    Jinger Neighbors, CPhT-Adv Oncology Pharmacy Patient Advocate [facility] Direct Number: [phone]  Fax: [phone]

## 2022-05-07 NOTE — Telephone Encounter (Signed)
Oral Oncology Patient Advocate Encounter   Received notification that prior authorization for Lenalidomide is required.   PA submitted on 05/07/22 Key [id number] Status is pending     Jinger Neighbors, CPhT-Adv Oncology Pharmacy Patient Advocate [facility] Direct Number: [phone]  Fax: [phone]

## 2022-05-07 NOTE — Progress Notes (Signed)
[facility] OFFICE PROGRESS NOTE  Patient Care Team: Rometta [name], MD as PCP - General (Internal Medicine)  ASSESSMENT & PLAN:  Multiple myeloma in relapse [facility]) I have reviewed his myeloma panel for the last 6 months The patient has stable disease control only with minimal response I reviewed his prior treatment The patient has no major signs of neuropathy We discussed several different treatment options Ultimately, he is in agreement after today to switch his treatment to combination of bortezomib, Revlimid and dexamethasone Due to significant heartburn, I will reduce the dexamethasone to last dose per week I will modify his regimen to weekly bortezomib to reduce his risk of peripheral neuropathy He will continue acyclovir, calcium and vitamin D He is not able to get Zometa due to inability to get dental clearance He will continue aspirin for DVT prophylaxis I will try to get him started on this new treatment on April 22  Pancytopenia, acquired [facility]) He has intermittent mild pancytopenia due to treatment He is not symptomatic We will proceed with treatment without delay  No orders of the defined types were placed in this encounter.   All questions were answered. The patient knows to call the clinic with any problems, questions or concerns. The total time spent in the appointment was 40 minutes encounter with patients including review of chart and various tests results, discussions about plan of care and coordination of care plan   Artis Delay, MD 05/07/2022 12:03 PM  INTERVAL HISTORY: Please see below for problem oriented charting. he returns for treatment follow-up We discussed recent myeloma panel and treatment options His light chains are not improving over the past 6 months He denies peripheral neuropathy No recent back pain no infection  REVIEW OF SYSTEMS:   Constitutional: Denies fevers, chills or abnormal weight loss Eyes: Denies blurriness of  vision Ears, nose, mouth, throat, and face: Denies mucositis or sore throat Respiratory: Denies cough, dyspnea or wheezes Cardiovascular: Denies palpitation, chest discomfort or lower extremity swelling Gastrointestinal:  Denies nausea, heartburn or change in bowel habits Skin: Denies abnormal skin rashes Lymphatics: Denies new lymphadenopathy or easy bruising Neurological:Denies numbness, tingling or new weaknesses Behavioral/Psych: Mood is stable, no new changes  All other systems were reviewed with the patient and are negative.  I have reviewed the past medical history, past surgical history, social history and family history with the patient and they are unchanged from previous note.  ALLERGIES:  is allergic to daratumumab and heparin.  MEDICATIONS:  Current Outpatient Medications  Medication Sig Dispense Refill   lenalidomide (REVLIMID) 25 MG capsule Take 1 capsule daily for 21 days, then stop 7 days, for cycle of every 28 days 21 capsule 0   acyclovir (ZOVIRAX) 400 MG tablet Take 1 tablet (400 mg total) by mouth 2 (two) times daily. 60 tablet 11   aspirin EC 81 MG tablet Take 81 mg by mouth daily. Swallow whole.     calcium carbonate (TUMS - DOSED IN MG ELEMENTAL CALCIUM) 500 MG chewable tablet Chew 1 tablet by mouth 3 (three) times daily.     cholecalciferol (VITAMIN D3) 25 MCG (1000 UT) tablet Take 1,000 Units by mouth daily.     dexamethasone (DECADRON) 4 MG tablet Take 5 tablets (20 mg total) by mouth once a week. Take 3 tablets weekly on Monday mornings with food 20 tablet 11   ondansetron (ZOFRAN) 8 MG tablet Take 1 tablet (8 mg total) by mouth every 8 (eight) hours as needed for nausea  or vomiting. 30 tablet 1   prochlorperazine (COMPAZINE) 10 MG tablet Take 1 tablet (10 mg total) by mouth every 6 (six) hours as needed for nausea or vomiting. 30 tablet 1   No current facility-administered medications for this visit.    SUMMARY OF ONCOLOGIC HISTORY: Oncology History   Multiple myeloma in relapse  06/23/2015 - 06/28/2015 Hospital Admission   The patient was admitted to the hospital due to gait ataxia and back pain. He was subsequently found to have cord compression underwent surgery and was discharged home   06/24/2015 Imaging   Abnormal appearance of the T6 vertebral body, highly suspicious for possible osseous metastasis. Associated pathologic fracture withup to 30% height loss. There is associated abnormal soft tissue density within the ventral epidural space,   06/24/2015 Imaging   MRI lumbar: Focal osseous lesion with abnormal enhancement involving the right pedicle of L3, suspicious for possible osseous metastasisgiven the findings in the thoracic spine. Question additional focal lesion within the right iliac wing as above.     06/25/2015 Pathology Results   Accession: [id number] bone biopsy come from plasma cell neoplasm.   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 Zometa is not given due to inability to get dental clearance   12/14/2015 - 04/24/2016 Chemotherapy   He is started on maintenance treatment with Revlimid only   12/18/2015 Imaging   MRI thoracic and lumbar spine showed numerous enhancing foci throughout the thoracic and lumbar spine with several new small foci in the lumbar spine in comparison with prior MRI compatible with metastatic disease. Stable loss of height of the T3, T4, and T6 vertebral bodies and new postsurgical changes related to T6 laminectomy. No significant epidural disease or evidence for cord compression. No abnormal enhancement of the spinal cord or cauda equina.   05/02/2016 Bone Marrow Biopsy   Outside bone marrow biopsy showed 20%  myeloma involvement   05/16/2016 Procedure   Successful placement of a right internal jugular approach power injectable Port-A-Cath. The catheter is ready for immediate use.   05/21/2016 - 07/03/2016 Chemotherapy   He received Kyprolis, Cytoxan and dexamethasone    07/08/2016 Procedure   Status post CT-guided bone marrow biopsy, with tissue specimen sent to pathology for complete histopathologic analysis   07/08/2016 Bone Marrow Biopsy   Bone Marrow, Aspirate,Biopsy, and Clot BONE MARROW: - MILDLY HYPERCELLULAR MARROW (60%) WITH PLASMA CELL NEOPLASM - SEE COMMENT PERIPHERAL BLOOD: - NORMOCYTIC ANEMIA Diagnosis Note The marrow is hypercellular with lambda-restricted plasma cells consistent with persistence of the patient's previously diagnosed plasma cell neoplasm. The plasma cells comprise approximately 10-15% of the total marrow cellularity, are enlarged, and arranged in clusters.   08/14/2016 Miscellaneous   He received conditioning treatment with melphalan   08/15/2016 Bone Marrow Transplant   He received autologous stem cell transplant   08/24/2016 - 08/29/2016 Hospital Admission   His post-transplant course was complicated by E-Coli bacteremia   11/26/2016 PET scan   PET CT at [facility] 1. Technically limited study due to soft tissue uptake. 2. New hypermetabolic uptake at C7 spinous process and left proximal femur that is of questionable significance in absence of underlying CT correlate. Further assessment with whole body bone scan may be considered. 2. Redemonstrated nonhypermetabolic  multifocal lucent and sclerotic lesions throughout the spine which are similar to prior.    12/02/2016 Bone Marrow Biopsy   He had repeat bone marrow biopsy at [facility] An immunohistochemical stain for CD138 is performed on the bone marrow core biopsy demonstrates increased plasma cells with focal clustering (10-20% overall), which are monotypic for lambda light chain by in situ hybridization.   01/06/2017  - 06/09/2017 Chemotherapy   He received weekly Dexamethasone, Pomalyst days 1-21 and Daratumumab. From 06/08/17 onwards, he is placed on maintenance Pomalyst only.  He self discontinue Pomalyst in February 2021   07/23/2017 Procedure   Successful right IJ vein Port-A-Cath explant.   03/22/2021 Imaging   1. New large disc protrusion at L4-5 with severe spinal stenosis. 2. History of multiple myeloma with largely resolved diffuse bone marrow heterogeneity since 2017. New enhancing lesions in the L4 and L5 vertebral bodies without acute fracture. 3. Chronic thoracic compression fractures including severe T6 vertebral body height loss which has progressed from 2017. Resolved epidural tumor.     11/26/2021 PET scan   1. Hypermetabolic patchy lytic L5 vertebral lesion compatible with metabolically active multiple myeloma. 2. No additional sites of hypermetabolic skeletal or extraskeletal myeloma. 3. Mild colonic diverticulosis.   11/27/2021 - 11/27/2021 Chemotherapy   Patient is on Treatment Plan : MYELOMA Daratumumab IV + Pomalidomide + Dexamethasone q28d x 7 cycles     11/27/2021 -  Chemotherapy   Patient is on Treatment Plan : MYELOMA RELAPSED REFRACTORY Daratumumab SQ + Pomalidomide + Dexamethasone (DaraPd) q28d       PHYSICAL EXAMINATION: ECOG PERFORMANCE STATUS: 0 - Asymptomatic  Vitals:   05/07/22 1008  BP: (!) 153/87  Pulse: 60  Resp: 18  Temp: 98 F (36.7 C)  SpO2: 100%   Filed Weights   05/07/22 1008  Weight: 254 lb 3.2 oz (115.3 kg)    GENERAL:alert, no distress and comfortable  NEURO: alert & oriented x 3 with fluent speech, no focal motor/sensory deficits  LABORATORY DATA:  I have reviewed the data as listed    Component Value Date/Time   NA 138 05/07/2022 0958   NA 139 01/27/2017 0809   K 4.2 05/07/2022 0958   K 4.1 01/27/2017 0809   CL 105 05/07/2022 0958   CO2 29 05/07/2022 0958   CO2 23 01/27/2017 0809   GLUCOSE 100 (H) 05/07/2022 0958   GLUCOSE 122  01/27/2017 0809   BUN 17 05/07/2022 0958   BUN 14.3 01/27/2017 0809   CREATININE 1.12 05/07/2022 0958   CREATININE 0.9 01/27/2017 0809   CALCIUM 10.0 05/07/2022 0958   CALCIUM 9.1 01/27/2017 0809   PROT 6.6 05/07/2022 0958   PROT 6.2 (L) 01/27/2017 0809   ALBUMIN 4.3 05/07/2022 0958   ALBUMIN 3.7 01/27/2017 0809   AST 19 05/07/2022 0958   AST 10 01/27/2017 0809   ALT 28 05/07/2022 0958   ALT 18 01/27/2017 0809   ALKPHOS 42 05/07/2022 0958   ALKPHOS 42 01/27/2017 0809   BILITOT 0.5 05/07/2022 0958   BILITOT 0.72 01/27/2017 0809   GFRNONAA >60 05/07/2022 0958   GFRAA >60 10/18/2019 1017    No results found for: "SPEP", "UPEP"  Lab Results  Component Value Date   WBC 3.9 (L) 05/07/2022   NEUTROABS 1.0 (L) 05/07/2022   HGB 13.0 05/07/2022   HCT 37.4 (L) 05/07/2022   MCV 92.1 05/07/2022   PLT 170 05/07/2022      Chemistry      Component Value Date/Time   NA  138 05/07/2022 0958   NA 139 01/27/2017 0809   K 4.2 05/07/2022 0958   K 4.1 01/27/2017 0809   CL 105 05/07/2022 0958   CO2 29 05/07/2022 0958   CO2 23 01/27/2017 0809   BUN 17 05/07/2022 0958   BUN 14.3 01/27/2017 0809   CREATININE 1.12 05/07/2022 0958   CREATININE 0.9 01/27/2017 0809      Component Value Date/Time   CALCIUM 10.0 05/07/2022 0958   CALCIUM 9.1 01/27/2017 0809   ALKPHOS 42 05/07/2022 0958   ALKPHOS 42 01/27/2017 0809   AST 19 05/07/2022 0958   AST 10 01/27/2017 0809   ALT 28 05/07/2022 0958   ALT 18 01/27/2017 0809   BILITOT 0.5 05/07/2022 0958   BILITOT 0.72 01/27/2017 0809

## 2022-05-08 ENCOUNTER — Other Ambulatory Visit: Payer: Self-pay

## 2022-05-20 ENCOUNTER — Encounter: Payer: Self-pay | Admitting: Hematology and Oncology

## 2022-05-20 ENCOUNTER — Inpatient Hospital Stay: Payer: No Typology Code available for payment source

## 2022-05-20 ENCOUNTER — Inpatient Hospital Stay (HOSPITAL_BASED_OUTPATIENT_CLINIC_OR_DEPARTMENT_OTHER): Payer: No Typology Code available for payment source | Admitting: Hematology and Oncology

## 2022-05-20 VITALS — BP 148/97 | HR 66 | Temp 98.0°F | Resp 21

## 2022-05-20 DIAGNOSIS — C9002 Multiple myeloma in relapse: Secondary | ICD-10-CM

## 2022-05-20 DIAGNOSIS — Z5112 Encounter for antineoplastic immunotherapy: Secondary | ICD-10-CM | POA: Diagnosis not present

## 2022-05-20 LAB — CMP (CANCER CENTER ONLY)
ALT: 16 U/L (ref 0–44)
AST: 14 U/L — ABNORMAL LOW (ref 15–41)
Albumin: 4.3 g/dL (ref 3.5–5.0)
Alkaline Phosphatase: 38 U/L (ref 38–126)
Anion gap: 4 — ABNORMAL LOW (ref 5–15)
BUN: 20 mg/dL (ref 6–20)
CO2: 29 mmol/L (ref 22–32)
Calcium: 9.7 mg/dL (ref 8.9–10.3)
Chloride: 106 mmol/L (ref 98–111)
Creatinine: 1.21 mg/dL (ref 0.61–1.24)
GFR, Estimated: >60 mL/min (ref >60)
Glucose, Bld: 99 mg/dL (ref 70–99)
Potassium: 4 mmol/L (ref 3.5–5.1)
Sodium: 139 mmol/L (ref 135–145)
Total Bilirubin: 0.5 mg/dL (ref 0.3–1.2)
Total Protein: 6.5 g/dL (ref 6.5–8.1)

## 2022-05-20 LAB — CBC WITH DIFFERENTIAL (CANCER CENTER ONLY)
Abs Immature Granulocytes: 0 10*3/uL (ref 0.00–0.07)
Basophils Absolute: 0.1 10*3/uL (ref 0.0–0.1)
Basophils Relative: 3 %
Eosinophils Absolute: 0.1 10*3/uL (ref 0.0–0.5)
Eosinophils Relative: 3 %
HCT: 38.7 % — ABNORMAL LOW (ref 39.0–52.0)
Hemoglobin: 13.3 g/dL (ref 13.0–17.0)
Immature Granulocytes: 0 %
Lymphocytes Relative: 50 %
Lymphs Abs: 2.1 10*3/uL (ref 0.7–4.0)
MCH: 31.7 pg (ref 26.0–34.0)
MCHC: 34.4 g/dL (ref 30.0–36.0)
MCV: 92.1 fL (ref 80.0–100.0)
Monocytes Absolute: 0.6 10*3/uL (ref 0.1–1.0)
Monocytes Relative: 14 %
Neutro Abs: 1.3 10*3/uL — ABNORMAL LOW (ref 1.7–7.7)
Neutrophils Relative %: 30 %
Platelet Count: 224 10*3/uL (ref 150–400)
RBC: 4.2 MIL/uL — ABNORMAL LOW (ref 4.22–5.81)
RDW: 13.1 % (ref 11.5–15.5)
WBC Count: 4.2 10*3/uL (ref 4.0–10.5)
nRBC: 0 % (ref 0.0–0.2)

## 2022-05-20 MED ORDER — BORTEZOMIB CHEMO SQ INJECTION 3.5 MG (2.5MG/ML)
0.9750 mg/m2 | Freq: Once | INTRAMUSCULAR | Status: AC
  Administered 2022-05-20: 2.5 mg via SUBCUTANEOUS
  Filled 2022-05-20: qty 1

## 2022-05-20 MED ORDER — DEXAMETHASONE 4 MG PO TABS
12.0000 mg | ORAL_TABLET | ORAL | 11 refills | Status: DC
Start: 2022-05-20 — End: 2023-01-14

## 2022-05-20 NOTE — Progress Notes (Signed)
Patient confirmed that he took his dexamethasone at home today.

## 2022-05-20 NOTE — Progress Notes (Signed)
[facility] OFFICE PROGRESS NOTE  Patient Care Team: Rometta [name], MD as PCP - General (Internal Medicine)  ASSESSMENT & PLAN:  Multiple myeloma in relapse [facility]) We discussed the risk and benefits of switching treatment to Revlimid, Velcade and dexamethasone I reminded the patient to take aspirin while on Revlimid We will start his treatment today He will continue acyclovir for antimicrobial prophylaxis He will continue calcium and vitamin D The patient is not able to get dental clearance yet; Zometa is on hold for now  No orders of the defined types were placed in this encounter.   All questions were answered. The patient knows to call the clinic with any problems, questions or concerns. The total time spent in the appointment was 20 minutes encounter with patients including review of chart and various tests results, discussions about plan of care and coordination of care plan   Artis Delay, MD 05/20/2022 1:02 PM  INTERVAL HISTORY: Please see below for problem oriented charting. he returns for treatment follow-up We discussed management and toxicity to be expected He will start Revlimid today He has not obtained dental clearance yet He is concerned about his plan for travel in June  REVIEW OF SYSTEMS:   Constitutional: Denies fevers, chills or abnormal weight loss Eyes: Denies blurriness of vision Ears, nose, mouth, throat, and face: Denies mucositis or sore throat Respiratory: Denies cough, dyspnea or wheezes Cardiovascular: Denies palpitation, chest discomfort or lower extremity swelling Gastrointestinal:  Denies nausea, heartburn or change in bowel habits Skin: Denies abnormal skin rashes Lymphatics: Denies new lymphadenopathy or easy bruising Neurological:Denies numbness, tingling or new weaknesses Behavioral/Psych: Mood is stable, no new changes  All other systems were reviewed with the patient and are negative.  I have reviewed the past medical  history, past surgical history, social history and family history with the patient and they are unchanged from previous note.  ALLERGIES:  is allergic to daratumumab and heparin.  MEDICATIONS:  Current Outpatient Medications  Medication Sig Dispense Refill   acyclovir (ZOVIRAX) 400 MG tablet Take 1 tablet (400 mg total) by mouth 2 (two) times daily. 60 tablet 11   aspirin EC 81 MG tablet Take 81 mg by mouth daily. Swallow whole.     calcium carbonate (TUMS - DOSED IN MG ELEMENTAL CALCIUM) 500 MG chewable tablet Chew 1 tablet by mouth 3 (three) times daily.     cholecalciferol (VITAMIN D3) 25 MCG (1000 UT) tablet Take 1,000 Units by mouth daily.     dexamethasone (DECADRON) 4 MG tablet Take 3 tablets (12 mg total) by mouth once a week. Take 3 tablets weekly on Monday mornings with food 20 tablet 11   lenalidomide (REVLIMID) 25 MG capsule Take 1 capsule daily for 21 days, then stop 7 days, for cycle of every 28 days 21 capsule 0   ondansetron (ZOFRAN) 8 MG tablet Take 1 tablet (8 mg total) by mouth every 8 (eight) hours as needed for nausea or vomiting. 30 tablet 1   prochlorperazine (COMPAZINE) 10 MG tablet Take 1 tablet (10 mg total) by mouth every 6 (six) hours as needed for nausea or vomiting. 30 tablet 1   No current facility-administered medications for this visit.    SUMMARY OF ONCOLOGIC HISTORY: Oncology History  Multiple myeloma in relapse  06/23/2015 - 06/28/2015 Hospital Admission   The patient was admitted to the hospital due to gait ataxia and back pain. He was subsequently found to have cord compression underwent surgery and was discharged home  06/24/2015 Imaging   Abnormal appearance of the T6 vertebral body, highly suspicious for possible osseous metastasis. Associated pathologic fracture withup to 30% height loss. There is associated abnormal soft tissue density within the ventral epidural space,   06/24/2015 Imaging   MRI lumbar: Focal osseous lesion with abnormal  enhancement involving the right pedicle of L3, suspicious for possible osseous metastasisgiven the findings in the thoracic spine. Question additional focal lesion within the right iliac wing as above.     06/25/2015 Pathology Results   Accession: [id number] bone biopsy come from plasma cell neoplasm.   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 Zometa is not given due to inability to get dental clearance   12/14/2015 - 04/24/2016 Chemotherapy   He is started on maintenance treatment with Revlimid only   12/18/2015 Imaging   MRI thoracic and lumbar spine showed numerous enhancing foci throughout the thoracic and lumbar spine with several new small foci in the lumbar spine in comparison with prior MRI compatible with metastatic disease. Stable loss of height of the T3, T4, and T6 vertebral bodies and new postsurgical changes related to T6 laminectomy. No significant epidural disease or evidence for cord compression. No abnormal enhancement of the spinal cord or cauda equina.   05/02/2016 Bone Marrow Biopsy   Outside bone marrow biopsy showed 20% myeloma involvement   05/16/2016 Procedure   Successful placement of a right internal jugular approach power injectable Port-A-Cath. The catheter is ready for immediate use.   05/21/2016 - 07/03/2016 Chemotherapy   He received Kyprolis, Cytoxan and dexamethasone    07/08/2016 Procedure   Status post CT-guided bone marrow biopsy, with tissue specimen sent to pathology for complete histopathologic analysis   07/08/2016 Bone Marrow Biopsy   Bone Marrow, Aspirate,Biopsy, and Clot BONE MARROW: - MILDLY HYPERCELLULAR MARROW (60%) WITH PLASMA CELL NEOPLASM - SEE  COMMENT PERIPHERAL BLOOD: - NORMOCYTIC ANEMIA Diagnosis Note The marrow is hypercellular with lambda-restricted plasma cells consistent with persistence of the patient's previously diagnosed plasma cell neoplasm. The plasma cells comprise approximately 10-15% of the total marrow cellularity, are enlarged, and arranged in clusters.   08/14/2016 Miscellaneous   He received conditioning treatment with melphalan   08/15/2016 Bone Marrow Transplant   He received autologous stem cell transplant   08/24/2016 - 08/29/2016 Hospital Admission   His post-transplant course was complicated by E-Coli bacteremia   11/26/2016 PET scan   PET CT at [facility] 1. Technically limited study due to soft tissue uptake. 2. New hypermetabolic uptake at C7 spinous process and left proximal femur that is of questionable significance in absence of underlying CT correlate. Further assessment with whole body bone scan may be considered. 2. Redemonstrated nonhypermetabolic multifocal lucent and sclerotic lesions throughout the spine which are similar to prior.    12/02/2016 Bone Marrow Biopsy   He had repeat bone marrow biopsy at [facility] An immunohistochemical stain for CD138 is performed on the bone marrow core biopsy demonstrates increased plasma cells with focal clustering (10-20% overall), which are monotypic for lambda light chain by in situ hybridization.   01/06/2017 - 06/09/2017 Chemotherapy   He received weekly Dexamethasone, Pomalyst days 1-21 and Daratumumab. From 06/08/17 onwards, he is placed on maintenance Pomalyst only.  He self discontinue  Pomalyst in February 2021   07/23/2017 Procedure   Successful right IJ vein Port-A-Cath explant.   03/22/2021 Imaging   1. New large disc protrusion at L4-5 with severe spinal stenosis. 2. History of multiple myeloma with largely resolved diffuse bone marrow heterogeneity since 2017. New enhancing lesions in the L4 and L5 vertebral bodies without acute fracture. 3. Chronic  thoracic compression fractures including severe T6 vertebral body height loss which has progressed from 2017. Resolved epidural tumor.     11/26/2021 PET scan   1. Hypermetabolic patchy lytic L5 vertebral lesion compatible with metabolically active multiple myeloma. 2. No additional sites of hypermetabolic skeletal or extraskeletal myeloma. 3. Mild colonic diverticulosis.   11/27/2021 - 11/27/2021 Chemotherapy   Patient is on Treatment Plan : MYELOMA Daratumumab IV + Pomalidomide + Dexamethasone q28d x 7 cycles     11/27/2021 - 05/07/2022 Chemotherapy   Patient is on Treatment Plan : MYELOMA RELAPSED REFRACTORY Daratumumab SQ + Pomalidomide + Dexamethasone (DaraPd) q28d     05/20/2022 -  Chemotherapy   Patient is on Treatment Plan : MYELOMA  RVD SQ q21d x 4 cycles       PHYSICAL EXAMINATION: ECOG PERFORMANCE STATUS: 0 - Asymptomatic  Vitals:   05/20/22 1240  BP: (!) 144/88  Pulse: 78  Resp: 18  SpO2: 100%   Filed Weights   05/20/22 1240  Weight: 253 lb 9.6 oz (115 kg)    GENERAL:alert, no distress and comfortable NEURO: alert & oriented x 3 with fluent speech, no focal motor/sensory deficits  LABORATORY DATA:  I have reviewed the data as listed    Component Value Date/Time   NA 138 05/07/2022 0958   NA 139 01/27/2017 0809   K 4.2 05/07/2022 0958   K 4.1 01/27/2017 0809   CL 105 05/07/2022 0958   CO2 29 05/07/2022 0958   CO2 23 01/27/2017 0809   GLUCOSE 100 (H) 05/07/2022 0958   GLUCOSE 122 01/27/2017 0809   BUN 17 05/07/2022 0958   BUN 14.3 01/27/2017 0809   CREATININE 1.12 05/07/2022 0958   CREATININE 0.9 01/27/2017 0809   CALCIUM 10.0 05/07/2022 0958   CALCIUM 9.1 01/27/2017 0809   PROT 6.6 05/07/2022 0958   PROT 6.2 (L) 01/27/2017 0809   ALBUMIN 4.3 05/07/2022 0958   ALBUMIN 3.7 01/27/2017 0809   AST 19 05/07/2022 0958   AST 10 01/27/2017 0809   ALT 28 05/07/2022 0958   ALT 18 01/27/2017 0809   ALKPHOS 42 05/07/2022 0958   ALKPHOS 42 01/27/2017 0809    BILITOT 0.5 05/07/2022 0958   BILITOT 0.72 01/27/2017 0809   GFRNONAA >60 05/07/2022 0958   GFRAA >60 10/18/2019 1017    No results found for: "SPEP", "UPEP"  Lab Results  Component Value Date   WBC 4.2 05/20/2022   NEUTROABS 1.3 (L) 05/20/2022   HGB 13.3 05/20/2022   HCT 38.7 (L) 05/20/2022   MCV 92.1 05/20/2022   PLT 224 05/20/2022      Chemistry      Component Value Date/Time   NA 138 05/07/2022 0958   NA 139 01/27/2017 0809   K 4.2 05/07/2022 0958   K 4.1 01/27/2017 0809   CL 105 05/07/2022 0958   CO2 29 05/07/2022 0958   CO2 23 01/27/2017 0809   BUN 17 05/07/2022 0958   BUN 14.3 01/27/2017 0809   CREATININE 1.12 05/07/2022 0958   CREATININE 0.9 01/27/2017 0809      Component Value Date/Time   CALCIUM 10.0 05/07/2022 0958  CALCIUM 9.1 01/27/2017 0809   ALKPHOS 42 05/07/2022 0958   ALKPHOS 42 01/27/2017 0809   AST 19 05/07/2022 0958   AST 10 01/27/2017 0809   ALT 28 05/07/2022 0958   ALT 18 01/27/2017 0809   BILITOT 0.5 05/07/2022 0958   BILITOT 0.72 01/27/2017 0809

## 2022-05-20 NOTE — Progress Notes (Signed)
Per Dr. Bertis Ruddy, OK to treat with ANC 1.3.

## 2022-05-20 NOTE — Assessment & Plan Note (Signed)
We discussed the risk and benefits of switching treatment to Revlimid, Velcade and dexamethasone I reminded the patient to take aspirin while on Revlimid We will start his treatment today He will continue acyclovir for antimicrobial prophylaxis He will continue calcium and vitamin D The patient is not able to get dental clearance yet; Zometa is on hold for now

## 2022-05-20 NOTE — Progress Notes (Signed)
Patient remained for 30 min post observation.  VSS at discharge, tolerated treatment well, ambulated to lobby.

## 2022-05-20 NOTE — Patient Instructions (Addendum)
[facility]  Discharge Instructions: Thank you for choosing [facility] to provide your oncology and hematology care.   If you have a lab appointment with the Cancer Center, please go directly to the Cancer Center and check in at the registration area.   Wear comfortable clothing and clothing appropriate for easy access to any Portacath or PICC line.   We strive to give you quality time with your provider. You may need to reschedule your appointment if you arrive late (15 or more minutes).  Arriving late affects you and other patients whose appointments are after yours.  Also, if you miss three or more appointments without notifying the office, you may be dismissed from the clinic at the provider's discretion.      For prescription refill requests, have your pharmacy contact our office and allow 72 hours for refills to be completed.    Today you received the following chemotherapy and/or immunotherapy agents Velcade   To help prevent nausea and vomiting after your treatment, we encourage you to take your nausea medication as directed.  BELOW ARE SYMPTOMS THAT SHOULD BE REPORTED IMMEDIATELY: *FEVER GREATER THAN 100.4 F (38 C) OR HIGHER *CHILLS OR SWEATING *NAUSEA AND VOMITING THAT IS NOT CONTROLLED WITH YOUR NAUSEA MEDICATION *UNUSUAL SHORTNESS OF BREATH *UNUSUAL BRUISING OR BLEEDING *URINARY PROBLEMS (pain or burning when urinating, or frequent urination) *BOWEL PROBLEMS (unusual diarrhea, constipation, pain near the anus) TENDERNESS IN MOUTH AND THROAT WITH OR WITHOUT PRESENCE OF ULCERS (sore throat, sores in mouth, or a toothache) UNUSUAL RASH, SWELLING OR PAIN  UNUSUAL VAGINAL DISCHARGE OR ITCHING   Items with * indicate a potential emergency and should be followed up as soon as possible or go to the Emergency Department if any problems should occur.  Please show the CHEMOTHERAPY ALERT CARD or IMMUNOTHERAPY ALERT CARD at check-in to  the Emergency Department and triage nurse.  Should you have questions after your visit or need to cancel or reschedule your appointment, please contact [facility]  Dept: [phone]  and follow the prompts.  Office hours are 8:00 a.m. to 4:30 p.m. Monday - Friday. Please note that voicemails left after 4:00 p.m. may not be returned until the following business day.  We are closed weekends and major holidays. You have access to a nurse at all times for urgent questions. Please call the main number to the clinic Dept: [phone] and follow the prompts.   For any non-urgent questions, you may also contact your provider using MyChart. We now offer e-Visits for anyone 18 and older to request care online for non-urgent symptoms. For details visit [email or web address].   Also download the MyChart app! Go to the app store, search "MyChart", open the app, select [facility], and log in with your MyChart username and password.  Bortezomib Injection What is this medication? BORTEZOMIB (bor TEZ oh mib) treats lymphoma. It may also be used to treat multiple myeloma, a type of bone marrow cancer. It works by blocking a protein that causes cancer cells to grow and multiply. This helps to slow or stop the spread of cancer cells. This medicine may be used for other purposes; ask your health care provider or pharmacist if you have questions. COMMON BRAND NAME(S): Velcade What should I tell my care team before I take this medication? They need to know if you have any of these conditions: Dehydration Diabetes Heart disease Liver disease Tingling of the fingers   or toes or other nerve disorder An unusual or allergic reaction to bortezomib, other medications, foods, dyes, or preservatives If you or your partner are pregnant or trying to get pregnant Breastfeeding How should I use this medication? This medication is injected into a vein or under the skin. It is given by  your care team in a hospital or clinic setting. Talk to your care team about the use of this medication in children. Special care may be needed. Overdosage: If you think you have taken too much of this medicine contact a poison control center or emergency room at once. NOTE: This medicine is only for you. Do not share this medicine with others. What if I miss a dose? Keep appointments for follow-up doses. It is important not to miss your dose. Call your care team if you are unable to keep an appointment. What may interact with this medication? Ketoconazole Rifampin This list may not describe all possible interactions. Give your health care provider a list of all the medicines, herbs, non-prescription drugs, or dietary supplements you use. Also tell them if you smoke, drink alcohol, or use illegal drugs. Some items may interact with your medicine. What should I watch for while using this medication? Your condition will be monitored carefully while you are receiving this medication. You may need blood work while taking this medication. This medication may affect your coordination, reaction time, or judgment. Do not drive or operate machinery until you know how this medication affects you. Sit up or stand slowly to reduce the risk of dizzy or fainting spells. Drinking alcohol with this medication can increase the risk of these side effects. This medication may increase your risk of getting an infection. Call your care team for advice if you get a fever, chills, sore throat, or other symptoms of a cold or flu. Do not treat yourself. Try to avoid being around people who are sick. Check with your care team if you have severe diarrhea, nausea, and vomiting, or if you sweat a lot. The loss of too much body fluid may make it dangerous for you to take this medication. Talk to your care team if you may be pregnant. Serious birth defects can occur if you take this medication during pregnancy and for 7 months  after the last dose. You will need a negative pregnancy test before starting this medication. Contraception is recommended while taking this medication and for 7 months after the last dose. Your care team can help you find the option that works for you. If your partner can get pregnant, use a condom during sex while taking this medication and for 4 months after the last dose. Do not breastfeed while taking this medication and for 2 months after the last dose. This medication may cause infertility. Talk to your care team if you are concerned about your fertility. What side effects may I notice from receiving this medication? Side effects that you should report to your care team as soon as possible: Allergic reactions--skin rash, itching, hives, swelling of the face, lips, tongue, or throat Bleeding--bloody or black, tar-like stools, vomiting blood or brown material that looks like coffee grounds, red or dark brown urine, small red or purple spots on skin, unusual bruising or bleeding Bleeding in the brain--severe headache, stiff neck, confusion, dizziness, change in vision, numbness or weakness of the face, arm, or leg, trouble speaking, trouble walking, vomiting Bowel blockage--stomach cramping, unable to have a bowel movement or pass gas, loss of appetite, vomiting Heart   failure--shortness of breath, swelling of the ankles, feet, or hands, sudden weight gain, unusual weakness or fatigue Infection--fever, chills, cough, sore throat, wounds that don't heal, pain or trouble when passing urine, general feeling of discomfort or being unwell Liver injury--right upper belly pain, loss of appetite, nausea, light-colored stool, dark yellow or brown urine, yellowing skin or eyes, unusual weakness or fatigue Low blood pressure--dizziness, feeling faint or lightheaded, blurry vision Lung injury--shortness of breath or trouble breathing, cough, spitting up blood, chest pain, fever Pain, tingling, or numbness in  the hands or feet Severe or prolonged diarrhea Stomach pain, bloody diarrhea, pale skin, unusual weakness or fatigue, decrease in the amount of urine, which may be signs of hemolytic uremic syndrome Sudden and severe headache, confusion, change in vision, seizures, which may be signs of posterior reversible encephalopathy syndrome (PRES) TTP--purple spots on the skin or inside the mouth, pale skin, yellowing skin or eyes, unusual weakness or fatigue, fever, fast or irregular heartbeat, confusion, change in vision, trouble speaking, trouble walking Tumor lysis syndrome (TLS)--nausea, vomiting, diarrhea, decrease in the amount of urine, dark urine, unusual weakness or fatigue, confusion, muscle pain or cramps, fast or irregular heartbeat, joint pain Side effects that usually do not require medical attention (report to your care team if they continue or are bothersome): Constipation Diarrhea Fatigue Loss of appetite Nausea This list may not describe all possible side effects. Call your doctor for medical advice about side effects. You may report side effects to FDA at 1-800-FDA-1088. Where should I keep my medication? This medication is given in a hospital or clinic. It will not be stored at home. NOTE: This sheet is a summary. It may not cover all possible information. If you have questions about this medicine, talk to your doctor, pharmacist, or health care provider.  2023 Elsevier/Gold Standard (2021-06-13 00:00:00)  

## 2022-05-21 ENCOUNTER — Telehealth: Payer: Self-pay

## 2022-05-21 NOTE — Telephone Encounter (Signed)
Bradley Hunt states that he is doing fine.  He is eating, drinking, and urinating well. He has felt some intermittent numbness in feet and headache. He knows to call the office at  [phone] if he has any questions or concerns.

## 2022-05-21 NOTE — Telephone Encounter (Signed)
-----   Message from Bradd Burner, RN sent at 05/20/2022  3:23 PM EDT ----- Regarding: Dr. Bertis Ruddy - first time Velcade since 2017, f/u call - pt tolerated well

## 2022-05-24 NOTE — Telephone Encounter (Signed)
Oral Chemotherapy Pharmacist Encounter  I spoke with patient for overview of: Revlimid for the treatment of multiple myeloma in conjunction with Velcade and dexamethasone, planned duration until disease progression or unacceptable toxicity.   Counseled patient on administration, dosing, side effects, monitoring, drug-food interactions, safe handling, storage, and disposal.  Patient will take Revlimid 25mg  capsules, 1 capsule by mouth once daily, without regard to food, with a full glass of water.  Revlimid will be given 21 days on, 7 days off, repeat every 28 days.  Patient will take dexamethasone 4mg  tablets, 3 tablets (12mg ) by mouth once weekly with breakfast.  Revlimid start date: 05/20/2022  Adverse effects of Revlimid include but are not limited to: nausea, constipation, diarrhea, abdominal pain, rash, fatigue, drug fever, and decreased blood counts.    Reviewed with patient importance of keeping a medication schedule and plan for any missed doses. No barriers to medication adherence identified.  Medication reconciliation performed and medication/allergy list updated.  Patient has picked up acyclovir and dexamethasone prescriptions and will wait until cycle 1 day 1 prior to starting dexamethasone. Patient counseled on importance of daily aspirin 81mg  for VTE prophylaxis.  Insurance authorization for Revlimid has been obtained.  Revlimid prescription is being dispensed from [facility] as it is a limited distribution medication.  All questions answered.  Patient voiced understanding and appreciation.   Medication education handout placed in mail for patient. Patient knows to call the office with questions or concerns. Oral Chemotherapy Clinic phone number provided to patient.   Bethel Born, PharmD Hematology/Oncology Clinical Pharmacist [facility] Oral Chemotherapy Navigation Clinic [phone] 05/24/2022    3:34 PM

## 2022-05-27 ENCOUNTER — Other Ambulatory Visit: Payer: Self-pay

## 2022-05-27 ENCOUNTER — Inpatient Hospital Stay: Payer: No Typology Code available for payment source

## 2022-05-27 VITALS — BP 141/89 | HR 76 | Temp 98.1°F | Resp 19 | Wt 256.5 lb

## 2022-05-27 DIAGNOSIS — C9002 Multiple myeloma in relapse: Secondary | ICD-10-CM

## 2022-05-27 DIAGNOSIS — Z5112 Encounter for antineoplastic immunotherapy: Secondary | ICD-10-CM | POA: Diagnosis not present

## 2022-05-27 LAB — CMP (CANCER CENTER ONLY)
ALT: 24 U/L (ref 0–44)
AST: 17 U/L (ref 15–41)
Albumin: 4.3 g/dL (ref 3.5–5.0)
Alkaline Phosphatase: 37 U/L — ABNORMAL LOW (ref 38–126)
Anion gap: 7 (ref 5–15)
BUN: 17 mg/dL (ref 6–20)
CO2: 29 mmol/L (ref 22–32)
Calcium: 9.6 mg/dL (ref 8.9–10.3)
Chloride: 105 mmol/L (ref 98–111)
Creatinine: 1.14 mg/dL (ref 0.61–1.24)
GFR, Estimated: >60 mL/min (ref >60)
Glucose, Bld: 112 mg/dL — ABNORMAL HIGH (ref 70–99)
Potassium: 4.2 mmol/L (ref 3.5–5.1)
Sodium: 141 mmol/L (ref 135–145)
Total Bilirubin: 0.5 mg/dL (ref 0.3–1.2)
Total Protein: 6.1 g/dL — ABNORMAL LOW (ref 6.5–8.1)

## 2022-05-27 LAB — CBC WITH DIFFERENTIAL (CANCER CENTER ONLY)
Abs Immature Granulocytes: 0 10*3/uL (ref 0.00–0.07)
Basophils Absolute: 0.1 10*3/uL (ref 0.0–0.1)
Basophils Relative: 1 %
Eosinophils Absolute: 0.3 10*3/uL (ref 0.0–0.5)
Eosinophils Relative: 6 %
HCT: 37.6 % — ABNORMAL LOW (ref 39.0–52.0)
Hemoglobin: 12.8 g/dL — ABNORMAL LOW (ref 13.0–17.0)
Immature Granulocytes: 0 %
Lymphocytes Relative: 45 %
Lymphs Abs: 1.9 10*3/uL (ref 0.7–4.0)
MCH: 31.6 pg (ref 26.0–34.0)
MCHC: 34 g/dL (ref 30.0–36.0)
MCV: 92.8 fL (ref 80.0–100.0)
Monocytes Absolute: 0.4 10*3/uL (ref 0.1–1.0)
Monocytes Relative: 8 %
Neutro Abs: 1.7 10*3/uL (ref 1.7–7.7)
Neutrophils Relative %: 40 %
Platelet Count: 170 10*3/uL (ref 150–400)
RBC: 4.05 MIL/uL — ABNORMAL LOW (ref 4.22–5.81)
RDW: 13.1 % (ref 11.5–15.5)
WBC Count: 4.3 10*3/uL (ref 4.0–10.5)
nRBC: 0 % (ref 0.0–0.2)

## 2022-05-27 MED ORDER — BORTEZOMIB CHEMO SQ INJECTION 3.5 MG (2.5MG/ML)
0.9750 mg/m2 | Freq: Once | INTRAMUSCULAR | Status: AC
  Administered 2022-05-27: 2.5 mg via SUBCUTANEOUS
  Filled 2022-05-27: qty 1

## 2022-05-27 NOTE — Patient Instructions (Signed)
[facility]  Discharge Instructions: Thank you for choosing [facility] to provide your oncology and hematology care.   If you have a lab appointment with the Cancer Center, please go directly to the Cancer Center and check in at the registration area.   Wear comfortable clothing and clothing appropriate for easy access to any Portacath or PICC line.   We strive to give you quality time with your provider. You may need to reschedule your appointment if you arrive late (15 or more minutes).  Arriving late affects you and other patients whose appointments are after yours.  Also, if you miss three or more appointments without notifying the office, you may be dismissed from the clinic at the provider's discretion.      For prescription refill requests, have your pharmacy contact our office and allow 72 hours for refills to be completed.    Today you received the following chemotherapy and/or immunotherapy agents: Velcade     To help prevent nausea and vomiting after your treatment, we encourage you to take your nausea medication as directed.  BELOW ARE SYMPTOMS THAT SHOULD BE REPORTED IMMEDIATELY: *FEVER GREATER THAN 100.4 F (38 C) OR HIGHER *CHILLS OR SWEATING *NAUSEA AND VOMITING THAT IS NOT CONTROLLED WITH YOUR NAUSEA MEDICATION *UNUSUAL SHORTNESS OF BREATH *UNUSUAL BRUISING OR BLEEDING *URINARY PROBLEMS (pain or burning when urinating, or frequent urination) *BOWEL PROBLEMS (unusual diarrhea, constipation, pain near the anus) TENDERNESS IN MOUTH AND THROAT WITH OR WITHOUT PRESENCE OF ULCERS (sore throat, sores in mouth, or a toothache) UNUSUAL RASH, SWELLING OR PAIN  UNUSUAL VAGINAL DISCHARGE OR ITCHING   Items with * indicate a potential emergency and should be followed up as soon as possible or go to the Emergency Department if any problems should occur.  Please show the CHEMOTHERAPY ALERT CARD or IMMUNOTHERAPY ALERT CARD at check-in  to the Emergency Department and triage nurse.  Should you have questions after your visit or need to cancel or reschedule your appointment, please contact [facility]  Dept: [phone]  and follow the prompts.  Office hours are 8:00 a.m. to 4:30 p.m. Monday - Friday. Please note that voicemails left after 4:00 p.m. may not be returned until the following business day.  We are closed weekends and major holidays. You have access to a nurse at all times for urgent questions. Please call the main number to the clinic Dept: [phone] and follow the prompts.   For any non-urgent questions, you may also contact your provider using MyChart. We now offer e-Visits for anyone 18 and older to request care online for non-urgent symptoms. For details visit [email or web address].   Also download the MyChart app! Go to the app store, search "MyChart", open the app, select [facility], and log in with your MyChart username and password.   

## 2022-06-03 ENCOUNTER — Inpatient Hospital Stay: Payer: No Typology Code available for payment source

## 2022-06-03 ENCOUNTER — Other Ambulatory Visit: Payer: Self-pay

## 2022-06-03 ENCOUNTER — Inpatient Hospital Stay: Payer: No Typology Code available for payment source | Attending: Internal Medicine

## 2022-06-03 VITALS — BP 118/79 | HR 74 | Temp 98.0°F | Resp 18 | Wt 258.5 lb

## 2022-06-03 DIAGNOSIS — Z79899 Other long term (current) drug therapy: Secondary | ICD-10-CM | POA: Insufficient documentation

## 2022-06-03 DIAGNOSIS — C9002 Multiple myeloma in relapse: Secondary | ICD-10-CM | POA: Diagnosis not present

## 2022-06-03 DIAGNOSIS — Z5112 Encounter for antineoplastic immunotherapy: Secondary | ICD-10-CM | POA: Diagnosis not present

## 2022-06-03 LAB — CBC WITH DIFFERENTIAL (CANCER CENTER ONLY)
Abs Immature Granulocytes: 0.01 10*3/uL (ref 0.00–0.07)
Basophils Absolute: 0 10*3/uL (ref 0.0–0.1)
Basophils Relative: 1 %
Eosinophils Absolute: 0.3 10*3/uL (ref 0.0–0.5)
Eosinophils Relative: 8 %
HCT: 38.1 % — ABNORMAL LOW (ref 39.0–52.0)
Hemoglobin: 13.3 g/dL (ref 13.0–17.0)
Immature Granulocytes: 0 %
Lymphocytes Relative: 32 %
Lymphs Abs: 1.2 10*3/uL (ref 0.7–4.0)
MCH: 32 pg (ref 26.0–34.0)
MCHC: 34.9 g/dL (ref 30.0–36.0)
MCV: 91.6 fL (ref 80.0–100.0)
Monocytes Absolute: 0.7 10*3/uL (ref 0.1–1.0)
Monocytes Relative: 18 %
Neutro Abs: 1.5 10*3/uL — ABNORMAL LOW (ref 1.7–7.7)
Neutrophils Relative %: 41 %
Platelet Count: 187 10*3/uL (ref 150–400)
RBC: 4.16 MIL/uL — ABNORMAL LOW (ref 4.22–5.81)
RDW: 12.9 % (ref 11.5–15.5)
WBC Count: 3.7 10*3/uL — ABNORMAL LOW (ref 4.0–10.5)
nRBC: 0 % (ref 0.0–0.2)

## 2022-06-03 LAB — CMP (CANCER CENTER ONLY)
ALT: 22 U/L (ref 0–44)
AST: 17 U/L (ref 15–41)
Albumin: 4.2 g/dL (ref 3.5–5.0)
Alkaline Phosphatase: 34 U/L — ABNORMAL LOW (ref 38–126)
Anion gap: 6 (ref 5–15)
BUN: 13 mg/dL (ref 6–20)
CO2: 29 mmol/L (ref 22–32)
Calcium: 9.3 mg/dL (ref 8.9–10.3)
Chloride: 104 mmol/L (ref 98–111)
Creatinine: 1.04 mg/dL (ref 0.61–1.24)
GFR, Estimated: >60 mL/min (ref >60)
Glucose, Bld: 109 mg/dL — ABNORMAL HIGH (ref 70–99)
Potassium: 3.7 mmol/L (ref 3.5–5.1)
Sodium: 139 mmol/L (ref 135–145)
Total Bilirubin: 0.8 mg/dL (ref 0.3–1.2)
Total Protein: 6.2 g/dL — ABNORMAL LOW (ref 6.5–8.1)

## 2022-06-03 MED ORDER — BORTEZOMIB CHEMO SQ INJECTION 3.5 MG (2.5MG/ML)
0.9750 mg/m2 | Freq: Once | INTRAMUSCULAR | Status: AC
  Administered 2022-06-03: 2.5 mg via SUBCUTANEOUS
  Filled 2022-06-03: qty 1

## 2022-06-03 NOTE — Patient Instructions (Signed)
[facility]  Discharge Instructions: Thank you for choosing [facility] to provide your oncology and hematology care.   If you have a lab appointment with the Cancer Center, please go directly to the Cancer Center and check in at the registration area.   Wear comfortable clothing and clothing appropriate for easy access to any Portacath or PICC line.   We strive to give you quality time with your provider. You may need to reschedule your appointment if you arrive late (15 or more minutes).  Arriving late affects you and other patients whose appointments are after yours.  Also, if you miss three or more appointments without notifying the office, you may be dismissed from the clinic at the provider's discretion.      For prescription refill requests, have your pharmacy contact our office and allow 72 hours for refills to be completed.    Today you received the following chemotherapy and/or immunotherapy agents: Velcade     To help prevent nausea and vomiting after your treatment, we encourage you to take your nausea medication as directed.  BELOW ARE SYMPTOMS THAT SHOULD BE REPORTED IMMEDIATELY: *FEVER GREATER THAN 100.4 F (38 C) OR HIGHER *CHILLS OR SWEATING *NAUSEA AND VOMITING THAT IS NOT CONTROLLED WITH YOUR NAUSEA MEDICATION *UNUSUAL SHORTNESS OF BREATH *UNUSUAL BRUISING OR BLEEDING *URINARY PROBLEMS (pain or burning when urinating, or frequent urination) *BOWEL PROBLEMS (unusual diarrhea, constipation, pain near the anus) TENDERNESS IN MOUTH AND THROAT WITH OR WITHOUT PRESENCE OF ULCERS (sore throat, sores in mouth, or a toothache) UNUSUAL RASH, SWELLING OR PAIN  UNUSUAL VAGINAL DISCHARGE OR ITCHING   Items with * indicate a potential emergency and should be followed up as soon as possible or go to the Emergency Department if any problems should occur.  Please show the CHEMOTHERAPY ALERT CARD or IMMUNOTHERAPY ALERT CARD at check-in  to the Emergency Department and triage nurse.  Should you have questions after your visit or need to cancel or reschedule your appointment, please contact [facility]  Dept: [phone]  and follow the prompts.  Office hours are 8:00 a.m. to 4:30 p.m. Monday - Friday. Please note that voicemails left after 4:00 p.m. may not be returned until the following business day.  We are closed weekends and major holidays. You have access to a nurse at all times for urgent questions. Please call the main number to the clinic Dept: [phone] and follow the prompts.   For any non-urgent questions, you may also contact your provider using MyChart. We now offer e-Visits for anyone 18 and older to request care online for non-urgent symptoms. For details visit [email or web address].   Also download the MyChart app! Go to the app store, search "MyChart", open the app, select , and log in with your MyChart username and password.   

## 2022-06-04 LAB — KAPPA/LAMBDA LIGHT CHAINS
Kappa free light chain: 13.8 mg/L (ref 3.3–19.4)
Kappa, lambda light chain ratio: 0.07 — ABNORMAL LOW (ref 0.26–1.65)
Lambda free light chains: 206 mg/L — ABNORMAL HIGH (ref 5.7–26.3)

## 2022-06-07 LAB — MULTIPLE MYELOMA PANEL, SERUM
Albumin SerPl Elph-Mcnc: 3.7 g/dL (ref 2.9–4.4)
Albumin/Glob SerPl: 1.7 (ref 0.7–1.7)
Alpha 1: 0.2 g/dL (ref 0.0–0.4)
Alpha2 Glob SerPl Elph-Mcnc: 0.6 g/dL (ref 0.4–1.0)
B-Globulin SerPl Elph-Mcnc: 0.9 g/dL (ref 0.7–1.3)
Gamma Glob SerPl Elph-Mcnc: 0.5 g/dL (ref 0.4–1.8)
Globulin, Total: 2.2 g/dL (ref 2.2–3.9)
IgA: 41 mg/dL — ABNORMAL LOW (ref 90–386)
IgG (Immunoglobin G), Serum: 592 mg/dL — ABNORMAL LOW (ref 603–1613)
IgM (Immunoglobulin M), Srm: 31 mg/dL (ref 20–172)
Total Protein ELP: 5.9 g/dL — ABNORMAL LOW (ref 6.0–8.5)

## 2022-06-10 ENCOUNTER — Inpatient Hospital Stay (HOSPITAL_BASED_OUTPATIENT_CLINIC_OR_DEPARTMENT_OTHER): Payer: No Typology Code available for payment source | Admitting: Hematology and Oncology

## 2022-06-10 ENCOUNTER — Other Ambulatory Visit: Payer: Self-pay

## 2022-06-10 ENCOUNTER — Other Ambulatory Visit (HOSPITAL_COMMUNITY): Payer: Self-pay

## 2022-06-10 ENCOUNTER — Inpatient Hospital Stay: Payer: No Typology Code available for payment source

## 2022-06-10 ENCOUNTER — Encounter: Payer: Self-pay | Admitting: Hematology and Oncology

## 2022-06-10 VITALS — BP 158/98 | HR 95

## 2022-06-10 VITALS — BP 153/99 | HR 100 | Temp 98.9°F | Resp 18 | Ht 74.0 in | Wt 256.6 lb

## 2022-06-10 DIAGNOSIS — C9002 Multiple myeloma in relapse: Secondary | ICD-10-CM

## 2022-06-10 DIAGNOSIS — J329 Chronic sinusitis, unspecified: Secondary | ICD-10-CM | POA: Insufficient documentation

## 2022-06-10 DIAGNOSIS — D61818 Other pancytopenia: Secondary | ICD-10-CM | POA: Diagnosis not present

## 2022-06-10 DIAGNOSIS — J019 Acute sinusitis, unspecified: Secondary | ICD-10-CM | POA: Diagnosis not present

## 2022-06-10 DIAGNOSIS — Z5112 Encounter for antineoplastic immunotherapy: Secondary | ICD-10-CM | POA: Diagnosis not present

## 2022-06-10 LAB — CBC WITH DIFFERENTIAL (CANCER CENTER ONLY)
Abs Immature Granulocytes: 0 10*3/uL (ref 0.00–0.07)
Basophils Absolute: 0 10*3/uL (ref 0.0–0.1)
Basophils Relative: 1 %
Eosinophils Absolute: 0.2 10*3/uL (ref 0.0–0.5)
Eosinophils Relative: 6 %
HCT: 38.1 % — ABNORMAL LOW (ref 39.0–52.0)
Hemoglobin: 13.3 g/dL (ref 13.0–17.0)
Immature Granulocytes: 0 %
Lymphocytes Relative: 32 %
Lymphs Abs: 1.2 10*3/uL (ref 0.7–4.0)
MCH: 31.8 pg (ref 26.0–34.0)
MCHC: 34.9 g/dL (ref 30.0–36.0)
MCV: 91.1 fL (ref 80.0–100.0)
Monocytes Absolute: 0.6 10*3/uL (ref 0.1–1.0)
Monocytes Relative: 16 %
Neutro Abs: 1.6 10*3/uL — ABNORMAL LOW (ref 1.7–7.7)
Neutrophils Relative %: 45 %
Platelet Count: 142 10*3/uL — ABNORMAL LOW (ref 150–400)
RBC: 4.18 MIL/uL — ABNORMAL LOW (ref 4.22–5.81)
RDW: 12.5 % (ref 11.5–15.5)
WBC Count: 3.7 10*3/uL — ABNORMAL LOW (ref 4.0–10.5)
nRBC: 0 % (ref 0.0–0.2)

## 2022-06-10 LAB — CMP (CANCER CENTER ONLY)
ALT: 16 U/L (ref 0–44)
AST: 14 U/L — ABNORMAL LOW (ref 15–41)
Albumin: 4 g/dL (ref 3.5–5.0)
Alkaline Phosphatase: 40 U/L (ref 38–126)
Anion gap: 9 (ref 5–15)
BUN: 11 mg/dL (ref 6–20)
CO2: 25 mmol/L (ref 22–32)
Calcium: 8.9 mg/dL (ref 8.9–10.3)
Chloride: 104 mmol/L (ref 98–111)
Creatinine: 1.17 mg/dL (ref 0.61–1.24)
GFR, Estimated: >60 mL/min (ref >60)
Glucose, Bld: 126 mg/dL — ABNORMAL HIGH (ref 70–99)
Potassium: 3.6 mmol/L (ref 3.5–5.1)
Sodium: 138 mmol/L (ref 135–145)
Total Bilirubin: 0.7 mg/dL (ref 0.3–1.2)
Total Protein: 6.4 g/dL — ABNORMAL LOW (ref 6.5–8.1)

## 2022-06-10 MED ORDER — AMOXICILLIN 500 MG PO CAPS
500.0000 mg | ORAL_CAPSULE | Freq: Two times a day (BID) | ORAL | 0 refills | Status: DC
  Filled 2022-06-10: qty 14, 7d supply, fill #0

## 2022-06-10 MED ORDER — PANTOPRAZOLE SODIUM 40 MG PO TBEC
40.0000 mg | DELAYED_RELEASE_TABLET | Freq: Every day | ORAL | 1 refills | Status: DC
  Filled 2022-06-10: qty 60, 60d supply, fill #0

## 2022-06-10 MED ORDER — BORTEZOMIB CHEMO SQ INJECTION 3.5 MG (2.5MG/ML)
0.9750 mg/m2 | Freq: Once | INTRAMUSCULAR | Status: AC
  Administered 2022-06-10: 2.5 mg via SUBCUTANEOUS
  Filled 2022-06-10: qty 1

## 2022-06-10 MED ORDER — LENALIDOMIDE 25 MG PO CAPS: ORAL_CAPSULE | ORAL | 0 refills | Status: DC

## 2022-06-10 NOTE — Assessment & Plan Note (Addendum)
The patient complained of some mild weakness while on treatment He had recent sinus congestion/infection causing headaches and cough.  He just completed a course of azithromycin but felt that his symptoms only marginally improved We will proceed with treatment as scheduled and I will treat his infection independently The patient plan to go back home to [place] between June 3 to July 5 We will delay his next cycle of treatment until he returns However, I reminded him that he needs to take Revlimid and dexamethasone while he is away for his trip He is reminded to take calcium and vitamin D supplement as well as acyclovir and aspirin  Reviewed recent myeloma panel with him M spike is no longer detected and his light chain levels are trending down

## 2022-06-10 NOTE — Assessment & Plan Note (Signed)
He has intermittent mild pancytopenia due to treatment We will proceed with treatment without delay

## 2022-06-10 NOTE — Progress Notes (Signed)
Patient states he took his dexamethasone at home today before arriving.

## 2022-06-10 NOTE — Progress Notes (Signed)
[facility] OFFICE PROGRESS NOTE  Patient Care Team: Rometta [name], MD as PCP - General (Internal Medicine)  ASSESSMENT & PLAN:  Multiple myeloma in relapse [facility]) The patient complained of some mild weakness while on treatment He had recent sinus congestion/infection causing headaches and cough.  He just completed a course of azithromycin but felt that his symptoms only marginally improved We will proceed with treatment as scheduled and I will treat his infection independently The patient plan to go back home to [place] between June 3 to July 5 We will delay his next cycle of treatment until he returns However, I reminded him that he needs to take Revlimid and dexamethasone while he is away for his trip He is reminded to take calcium and vitamin D supplement as well as acyclovir and aspirin  Reviewed recent myeloma panel with him M spike is no longer detected and his light chain levels are trending down  Pancytopenia, acquired (HCC) He has intermittent mild pancytopenia due to treatment We will proceed with treatment without delay  Sinusitis He has persistent sinusitis He just completed a course of azithromycin We gave him a course of amoxicillin I suspect his worsening cough at night is due to a component of reflux and I will prescribe pantoprazole for him  No orders of the defined types were placed in this encounter.   All questions were answered. The patient knows to call the clinic with any problems, questions or concerns. The total time spent in the appointment was 30 minutes encounter with patients including review of chart and various tests results, discussions about plan of care and coordination of care plan   Artis Delay, MD 06/10/2022 9:12 AM  INTERVAL HISTORY: Please see below for problem oriented charting. he returns for treatment follow-up The patient developed acute sinusitis recently He was prescribed a course of azithromycin He has productive  cough and marginally improved He noticed that his cough is more pronounced at nighttime and is cause difficulty with sleeping and headaches He thought he might have fever but he did not take his temperature The patient will be going out of country next month to go back to his home in [place] to visit his family We discussed logistics of modifying his treatment  REVIEW OF SYSTEMS:   Eyes: Denies blurriness of vision Cardiovascular: Denies palpitation, chest discomfort or lower extremity swelling Gastrointestinal:  Denies nausea, heartburn or change in bowel habits Skin: Denies abnormal skin rashes Lymphatics: Denies new lymphadenopathy or easy bruising Neurological:Denies numbness, tingling or new weaknesses Behavioral/Psych: Mood is stable, no new changes  All other systems were reviewed with the patient and are negative.  I have reviewed the past medical history, past surgical history, social history and family history with the patient and they are unchanged from previous note.  ALLERGIES:  is allergic to daratumumab and heparin.  MEDICATIONS:  Current Outpatient Medications  Medication Sig Dispense Refill   amoxicillin (AMOXIL) 500 MG capsule Take 1 capsule (500 mg total) by mouth 2 (two) times daily. 14 capsule 0   pantoprazole (PROTONIX) 40 MG tablet Take 1 tablet (40 mg total) by mouth daily. 60 tablet 1   acyclovir (ZOVIRAX) 400 MG tablet Take 1 tablet (400 mg total) by mouth 2 (two) times daily. 60 tablet 11   aspirin EC 81 MG tablet Take 81 mg by mouth daily. Swallow whole.     calcium carbonate (TUMS - DOSED IN MG ELEMENTAL CALCIUM) 500 MG chewable tablet Chew 1 tablet by  mouth 3 (three) times daily.     cholecalciferol (VITAMIN D3) 25 MCG (1000 UT) tablet Take 1,000 Units by mouth daily.     dexamethasone (DECADRON) 4 MG tablet Take 3 tablets (12 mg total) by mouth once a week. Take 3 tablets weekly on Monday mornings with food 20 tablet 11   lenalidomide (REVLIMID) 25 MG  capsule Take 1 capsule daily for 21 days, then stop 7 days, for cycle of every 28 days 21 capsule 0   ondansetron (ZOFRAN) 8 MG tablet Take 1 tablet (8 mg total) by mouth every 8 (eight) hours as needed for nausea or vomiting. 30 tablet 1   prochlorperazine (COMPAZINE) 10 MG tablet Take 1 tablet (10 mg total) by mouth every 6 (six) hours as needed for nausea or vomiting. 30 tablet 1   No current facility-administered medications for this visit.    SUMMARY OF ONCOLOGIC HISTORY: Oncology History  Multiple myeloma in relapse [facility])  06/23/2015 - 06/28/2015 Hospital Admission   The patient was admitted to the hospital due to gait ataxia and back pain. He was subsequently found to have cord compression underwent surgery and was discharged home   06/24/2015 Imaging   Abnormal appearance of the T6 vertebral body, highly suspicious for possible osseous metastasis. Associated pathologic fracture withup to 30% height loss. There is associated abnormal soft tissue density within the ventral epidural space,   06/24/2015 Imaging   MRI lumbar: Focal osseous lesion with abnormal enhancement involving the right pedicle of L3, suspicious for possible osseous metastasisgiven the findings in the thoracic spine. Question additional focal lesion within the right iliac wing as above.     06/25/2015 Pathology Results   Accession: [id number] bone biopsy come from plasma cell neoplasm.   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 Zometa is not given due to inability to get dental clearance   12/14/2015 - 04/24/2016 Chemotherapy   He is started on maintenance treatment with Revlimid only   12/18/2015 Imaging   MRI thoracic and  lumbar spine showed numerous enhancing foci throughout the thoracic and lumbar spine with several new small foci in the lumbar spine in comparison with prior MRI compatible with metastatic disease. Stable loss of height of the T3, T4, and T6 vertebral bodies and new postsurgical changes related to T6 laminectomy. No significant epidural disease or evidence for cord compression. No abnormal enhancement of the spinal cord or cauda equina.   05/02/2016 Bone Marrow Biopsy   Outside bone marrow biopsy showed 20% myeloma involvement   05/16/2016 Procedure   Successful placement of a right internal jugular approach power injectable Port-A-Cath. The catheter is ready for immediate use.   05/21/2016 - 07/03/2016 Chemotherapy   He received Kyprolis, Cytoxan and dexamethasone    07/08/2016 Procedure   Status post CT-guided bone marrow biopsy, with tissue specimen sent to pathology for complete histopathologic analysis   07/08/2016 Bone Marrow Biopsy   Bone Marrow, Aspirate,Biopsy, and Clot BONE MARROW: - MILDLY HYPERCELLULAR MARROW (60%) WITH PLASMA CELL NEOPLASM - SEE COMMENT PERIPHERAL BLOOD: - NORMOCYTIC ANEMIA Diagnosis Note The marrow is hypercellular with lambda-restricted plasma cells consistent with persistence of the patient's previously diagnosed plasma cell neoplasm. The plasma cells comprise approximately 10-15% of the total marrow  cellularity, are enlarged, and arranged in clusters.   08/14/2016 Miscellaneous   He received conditioning treatment with melphalan   08/15/2016 Bone Marrow Transplant   He received autologous stem cell transplant   08/24/2016 - 08/29/2016 Hospital Admission   His post-transplant course was complicated by E-Coli bacteremia   11/26/2016 PET scan   PET CT at [facility] 1. Technically limited study due to soft tissue uptake. 2. New hypermetabolic uptake at C7 spinous process and left proximal femur that is of questionable significance in absence of underlying CT  correlate. Further assessment with whole body bone scan may be considered. 2. Redemonstrated nonhypermetabolic multifocal lucent and sclerotic lesions throughout the spine which are similar to prior.    12/02/2016 Bone Marrow Biopsy   He had repeat bone marrow biopsy at [facility] An immunohistochemical stain for CD138 is performed on the bone marrow core biopsy demonstrates increased plasma cells with focal clustering (10-20% overall), which are monotypic for lambda light chain by in situ hybridization.   01/06/2017 - 06/09/2017 Chemotherapy   He received weekly Dexamethasone, Pomalyst days 1-21 and Daratumumab. From 06/08/17 onwards, he is placed on maintenance Pomalyst only.  He self discontinue Pomalyst in February 2021   07/23/2017 Procedure   Successful right IJ vein Port-A-Cath explant.   03/22/2021 Imaging   1. New large disc protrusion at L4-5 with severe spinal stenosis. 2. History of multiple myeloma with largely resolved diffuse bone marrow heterogeneity since 2017. New enhancing lesions in the L4 and L5 vertebral bodies without acute fracture. 3. Chronic thoracic compression fractures including severe T6 vertebral body height loss which has progressed from 2017. Resolved epidural tumor.     11/26/2021 PET scan   1. Hypermetabolic patchy lytic L5 vertebral lesion compatible with metabolically active multiple myeloma. 2. No additional sites of hypermetabolic skeletal or extraskeletal myeloma. 3. Mild colonic diverticulosis.   11/27/2021 - 11/27/2021 Chemotherapy   Patient is on Treatment Plan : MYELOMA Daratumumab IV + Pomalidomide + Dexamethasone q28d x 7 cycles     11/27/2021 - 05/07/2022 Chemotherapy   Patient is on Treatment Plan : MYELOMA RELAPSED REFRACTORY Daratumumab SQ + Pomalidomide + Dexamethasone (DaraPd) q28d     05/20/2022 -  Chemotherapy   Patient is on Treatment Plan : MYELOMA  RVD SQ q21d x 4 cycles       PHYSICAL EXAMINATION: ECOG PERFORMANCE STATUS: 1 -  Symptomatic but completely ambulatory  Vitals:   06/10/22 0834  BP: (!) 153/99  Pulse: 100  Resp: 18  Temp: 98.9 F (37.2 C)  SpO2: 99%   Filed Weights   06/10/22 0834  Weight: 256 lb 9.6 oz (116.4 kg)    GENERAL:alert, no distress and comfortable SKIN: skin color, texture, turgor are normal, no rashes or significant lesions EYES: normal, Conjunctiva are pink and non-injected, sclera clear OROPHARYNX:no exudate, no erythema and lips, buccal mucosa, and tongue normal  NECK: supple, thyroid normal size, non-tender, without nodularity LYMPH:  no palpable lymphadenopathy in the cervical, axillary or inguinal LUNGS: clear to auscultation and percussion with normal breathing effort HEART: regular rate & rhythm and no murmurs and no lower extremity edema ABDOMEN:abdomen soft, non-tender and normal bowel sounds Musculoskeletal:no cyanosis of digits and no clubbing  NEURO: alert & oriented x 3 with fluent speech, no focal motor/sensory deficits  LABORATORY DATA:  I have reviewed the data as listed    Component Value Date/Time   NA 138 06/10/2022 0820   NA 139 01/27/2017 0809   K 3.6 06/10/2022 0820  K 4.1 01/27/2017 0809   CL 104 06/10/2022 0820   CO2 25 06/10/2022 0820   CO2 23 01/27/2017 0809   GLUCOSE 126 (H) 06/10/2022 0820   GLUCOSE 122 01/27/2017 0809   BUN 11 06/10/2022 0820   BUN 14.3 01/27/2017 0809   CREATININE 1.17 06/10/2022 0820   CREATININE 0.9 01/27/2017 0809   CALCIUM 8.9 06/10/2022 0820   CALCIUM 9.1 01/27/2017 0809   PROT 6.4 (L) 06/10/2022 0820   PROT 6.2 (L) 01/27/2017 0809   ALBUMIN 4.0 06/10/2022 0820   ALBUMIN 3.7 01/27/2017 0809   AST 14 (L) 06/10/2022 0820   AST 10 01/27/2017 0809   ALT 16 06/10/2022 0820   ALT 18 01/27/2017 0809   ALKPHOS 40 06/10/2022 0820   ALKPHOS 42 01/27/2017 0809   BILITOT 0.7 06/10/2022 0820   BILITOT 0.72 01/27/2017 0809   GFRNONAA >60 06/10/2022 0820   GFRAA >60 10/18/2019 1017    No results found for: "SPEP",  "UPEP"  Lab Results  Component Value Date   WBC 3.7 (L) 06/10/2022   NEUTROABS 1.6 (L) 06/10/2022   HGB 13.3 06/10/2022   HCT 38.1 (L) 06/10/2022   MCV 91.1 06/10/2022   PLT 142 (L) 06/10/2022      Chemistry      Component Value Date/Time   NA 138 06/10/2022 0820   NA 139 01/27/2017 0809   K 3.6 06/10/2022 0820   K 4.1 01/27/2017 0809   CL 104 06/10/2022 0820   CO2 25 06/10/2022 0820   CO2 23 01/27/2017 0809   BUN 11 06/10/2022 0820   BUN 14.3 01/27/2017 0809   CREATININE 1.17 06/10/2022 0820   CREATININE 0.9 01/27/2017 0809      Component Value Date/Time   CALCIUM 8.9 06/10/2022 0820   CALCIUM 9.1 01/27/2017 0809   ALKPHOS 40 06/10/2022 0820   ALKPHOS 42 01/27/2017 0809   AST 14 (L) 06/10/2022 0820   AST 10 01/27/2017 0809   ALT 16 06/10/2022 0820   ALT 18 01/27/2017 0809   BILITOT 0.7 06/10/2022 0820   BILITOT 0.72 01/27/2017 0809

## 2022-06-10 NOTE — Assessment & Plan Note (Signed)
He has persistent sinusitis He just completed a course of azithromycin We gave him a course of amoxicillin I suspect his worsening cough at night is due to a component of reflux and I will prescribe pantoprazole for him

## 2022-06-10 NOTE — Patient Instructions (Signed)
[facility]  Discharge Instructions: Thank you for choosing [facility] to provide your oncology and hematology care.   If you have a lab appointment with the Cancer Center, please go directly to the Cancer Center and check in at the registration area.   Wear comfortable clothing and clothing appropriate for easy access to any Portacath or PICC line.   We strive to give you quality time with your provider. You may need to reschedule your appointment if you arrive late (15 or more minutes).  Arriving late affects you and other patients whose appointments are after yours.  Also, if you miss three or more appointments without notifying the office, you may be dismissed from the clinic at the provider's discretion.      For prescription refill requests, have your pharmacy contact our office and allow 72 hours for refills to be completed.    Today you received the following chemotherapy and/or immunotherapy agents: Velcade     To help prevent nausea and vomiting after your treatment, we encourage you to take your nausea medication as directed.  BELOW ARE SYMPTOMS THAT SHOULD BE REPORTED IMMEDIATELY: *FEVER GREATER THAN 100.4 F (38 C) OR HIGHER *CHILLS OR SWEATING *NAUSEA AND VOMITING THAT IS NOT CONTROLLED WITH YOUR NAUSEA MEDICATION *UNUSUAL SHORTNESS OF BREATH *UNUSUAL BRUISING OR BLEEDING *URINARY PROBLEMS (pain or burning when urinating, or frequent urination) *BOWEL PROBLEMS (unusual diarrhea, constipation, pain near the anus) TENDERNESS IN MOUTH AND THROAT WITH OR WITHOUT PRESENCE OF ULCERS (sore throat, sores in mouth, or a toothache) UNUSUAL RASH, SWELLING OR PAIN  UNUSUAL VAGINAL DISCHARGE OR ITCHING   Items with * indicate a potential emergency and should be followed up as soon as possible or go to the Emergency Department if any problems should occur.  Please show the CHEMOTHERAPY ALERT CARD or IMMUNOTHERAPY ALERT CARD at check-in  to the Emergency Department and triage nurse.  Should you have questions after your visit or need to cancel or reschedule your appointment, please contact [facility]  Dept: [phone]  and follow the prompts.  Office hours are 8:00 a.m. to 4:30 p.m. Monday - Friday. Please note that voicemails left after 4:00 p.m. may not be returned until the following business day.  We are closed weekends and major holidays. You have access to a nurse at all times for urgent questions. Please call the main number to the clinic Dept: [phone] and follow the prompts.   For any non-urgent questions, you may also contact your provider using MyChart. We now offer e-Visits for anyone 18 and older to request care online for non-urgent symptoms. For details visit [email or web address].   Also download the MyChart app! Go to the app store, search "MyChart", open the app, select [facility] [place], and log in with your MyChart username and password.   

## 2022-06-11 ENCOUNTER — Other Ambulatory Visit: Payer: Self-pay

## 2022-06-17 ENCOUNTER — Inpatient Hospital Stay: Payer: No Typology Code available for payment source

## 2022-06-17 ENCOUNTER — Other Ambulatory Visit: Payer: Self-pay

## 2022-06-17 VITALS — BP 139/91 | HR 84 | Temp 98.0°F | Resp 20 | Ht 74.0 in | Wt 252.0 lb

## 2022-06-17 DIAGNOSIS — Z5112 Encounter for antineoplastic immunotherapy: Secondary | ICD-10-CM | POA: Diagnosis not present

## 2022-06-17 DIAGNOSIS — C9002 Multiple myeloma in relapse: Secondary | ICD-10-CM

## 2022-06-17 LAB — CBC WITH DIFFERENTIAL (CANCER CENTER ONLY)
Abs Immature Granulocytes: 0.01 10*3/uL (ref 0.00–0.07)
Basophils Absolute: 0.1 10*3/uL (ref 0.0–0.1)
Basophils Relative: 2 %
Eosinophils Absolute: 0.1 10*3/uL (ref 0.0–0.5)
Eosinophils Relative: 2 %
HCT: 37.5 % — ABNORMAL LOW (ref 39.0–52.0)
Hemoglobin: 13.2 g/dL (ref 13.0–17.0)
Immature Granulocytes: 0 %
Lymphocytes Relative: 49 %
Lymphs Abs: 1.9 10*3/uL (ref 0.7–4.0)
MCH: 31.4 pg (ref 26.0–34.0)
MCHC: 35.2 g/dL (ref 30.0–36.0)
MCV: 89.3 fL (ref 80.0–100.0)
Monocytes Absolute: 0.4 10*3/uL (ref 0.1–1.0)
Monocytes Relative: 11 %
Neutro Abs: 1.4 10*3/uL — ABNORMAL LOW (ref 1.7–7.7)
Neutrophils Relative %: 36 %
Platelet Count: 229 10*3/uL (ref 150–400)
RBC: 4.2 MIL/uL — ABNORMAL LOW (ref 4.22–5.81)
RDW: 12.7 % (ref 11.5–15.5)
WBC Count: 3.9 10*3/uL — ABNORMAL LOW (ref 4.0–10.5)
nRBC: 0 % (ref 0.0–0.2)

## 2022-06-17 LAB — CMP (CANCER CENTER ONLY)
ALT: 17 U/L (ref 0–44)
AST: 15 U/L (ref 15–41)
Albumin: 4.1 g/dL (ref 3.5–5.0)
Alkaline Phosphatase: 40 U/L (ref 38–126)
Anion gap: 7 (ref 5–15)
BUN: 15 mg/dL (ref 6–20)
CO2: 28 mmol/L (ref 22–32)
Calcium: 9.4 mg/dL (ref 8.9–10.3)
Chloride: 106 mmol/L (ref 98–111)
Creatinine: 1.03 mg/dL (ref 0.61–1.24)
GFR, Estimated: >60 mL/min (ref >60)
Glucose, Bld: 101 mg/dL — ABNORMAL HIGH (ref 70–99)
Potassium: 3.7 mmol/L (ref 3.5–5.1)
Sodium: 141 mmol/L (ref 135–145)
Total Bilirubin: 0.5 mg/dL (ref 0.3–1.2)
Total Protein: 6.3 g/dL — ABNORMAL LOW (ref 6.5–8.1)

## 2022-06-17 MED ORDER — BORTEZOMIB CHEMO SQ INJECTION 3.5 MG (2.5MG/ML)
0.9750 mg/m2 | Freq: Once | INTRAMUSCULAR | Status: AC
  Administered 2022-06-17: 2.5 mg via SUBCUTANEOUS
  Filled 2022-06-17: qty 1

## 2022-06-17 NOTE — Progress Notes (Signed)
Per Bertis Ruddy, MD okay to treat with Neutrophils 1.4

## 2022-06-17 NOTE — Patient Instructions (Signed)
[place] [facility]  Discharge Instructions: Thank you for choosing [facility] to provide your oncology and hematology care.   If you have a lab appointment with the Cancer Center, please go directly to the Cancer Center and check in at the registration area.   Wear comfortable clothing and clothing appropriate for easy access to any Portacath or PICC line.   We strive to give you quality time with your provider. You may need to reschedule your appointment if you arrive late (15 or more minutes).  Arriving late affects you and other patients whose appointments are after yours.  Also, if you miss three or more appointments without notifying the office, you may be dismissed from the clinic at the provider's discretion.      For prescription refill requests, have your pharmacy contact our office and allow 72 hours for refills to be completed.    Today you received the following chemotherapy and/or immunotherapy agents: Velcade     To help prevent nausea and vomiting after your treatment, we encourage you to take your nausea medication as directed.  BELOW ARE SYMPTOMS THAT SHOULD BE REPORTED IMMEDIATELY: *FEVER GREATER THAN 100.4 F (38 C) OR HIGHER *CHILLS OR SWEATING *NAUSEA AND VOMITING THAT IS NOT CONTROLLED WITH YOUR NAUSEA MEDICATION *UNUSUAL SHORTNESS OF BREATH *UNUSUAL BRUISING OR BLEEDING *URINARY PROBLEMS (pain or burning when urinating, or frequent urination) *BOWEL PROBLEMS (unusual diarrhea, constipation, pain near the anus) TENDERNESS IN MOUTH AND THROAT WITH OR WITHOUT PRESENCE OF ULCERS (sore throat, sores in mouth, or a toothache) UNUSUAL RASH, SWELLING OR PAIN  UNUSUAL VAGINAL DISCHARGE OR ITCHING   Items with * indicate a potential emergency and should be followed up as soon as possible or go to the Emergency Department if any problems should occur.  Please show the CHEMOTHERAPY ALERT CARD or IMMUNOTHERAPY ALERT CARD at check-in  to the Emergency Department and triage nurse.  Should you have questions after your visit or need to cancel or reschedule your appointment, please contact [facility]  Dept: [phone]  and follow the prompts.  Office hours are 8:00 a.m. to 4:30 p.m. Monday - Friday. Please note that voicemails left after 4:00 p.m. may not be returned until the following business day.  We are closed weekends and major holidays. You have access to a nurse at all times for urgent questions. Please call the main number to the clinic Dept: [phone] and follow the prompts.   For any non-urgent questions, you may also contact your provider using MyChart. We now offer e-Visits for anyone 18 and older to request care online for non-urgent symptoms. For details visit [email or web address].   Also download the MyChart app! Go to the app store, search "MyChart", open the app, select [facility], and log in with your MyChart username and password.   

## 2022-06-17 NOTE — Progress Notes (Signed)
Pt took decadron at home prior to appointment time.

## 2022-07-04 ENCOUNTER — Other Ambulatory Visit: Payer: Self-pay

## 2022-07-04 MED ORDER — LENALIDOMIDE 25 MG PO CAPS: ORAL_CAPSULE | ORAL | 0 refills | Status: DC

## 2022-08-05 ENCOUNTER — Other Ambulatory Visit: Payer: Self-pay

## 2022-08-05 ENCOUNTER — Other Ambulatory Visit: Payer: Self-pay | Admitting: *Deleted

## 2022-08-05 ENCOUNTER — Inpatient Hospital Stay (HOSPITAL_BASED_OUTPATIENT_CLINIC_OR_DEPARTMENT_OTHER): Payer: No Typology Code available for payment source | Admitting: Hematology and Oncology

## 2022-08-05 ENCOUNTER — Encounter: Payer: Self-pay | Admitting: Hematology and Oncology

## 2022-08-05 ENCOUNTER — Inpatient Hospital Stay: Payer: No Typology Code available for payment source

## 2022-08-05 ENCOUNTER — Inpatient Hospital Stay: Payer: No Typology Code available for payment source | Attending: Internal Medicine

## 2022-08-05 VITALS — BP 146/93 | HR 75 | Temp 98.6°F | Resp 17 | Wt 255.2 lb

## 2022-08-05 DIAGNOSIS — C9002 Multiple myeloma in relapse: Secondary | ICD-10-CM | POA: Insufficient documentation

## 2022-08-05 DIAGNOSIS — Z5112 Encounter for antineoplastic immunotherapy: Secondary | ICD-10-CM | POA: Insufficient documentation

## 2022-08-05 DIAGNOSIS — Z79899 Other long term (current) drug therapy: Secondary | ICD-10-CM | POA: Diagnosis not present

## 2022-08-05 LAB — CMP (CANCER CENTER ONLY)
ALT: 28 U/L (ref 0–44)
AST: 24 U/L (ref 15–41)
Albumin: 4.1 g/dL (ref 3.5–5.0)
Alkaline Phosphatase: 53 U/L (ref 38–126)
Anion gap: 6 (ref 5–15)
BUN: 13 mg/dL (ref 6–20)
CO2: 30 mmol/L (ref 22–32)
Calcium: 9.7 mg/dL (ref 8.9–10.3)
Chloride: 104 mmol/L (ref 98–111)
Creatinine: 1.11 mg/dL (ref 0.61–1.24)
GFR, Estimated: >60 mL/min (ref >60)
Glucose, Bld: 83 mg/dL (ref 70–99)
Potassium: 3.5 mmol/L (ref 3.5–5.1)
Sodium: 140 mmol/L (ref 135–145)
Total Bilirubin: 0.8 mg/dL (ref 0.3–1.2)
Total Protein: 6.6 g/dL (ref 6.5–8.1)

## 2022-08-05 LAB — CBC WITH DIFFERENTIAL (CANCER CENTER ONLY)
Abs Immature Granulocytes: 0 10*3/uL (ref 0.00–0.07)
Basophils Absolute: 0 10*3/uL (ref 0.0–0.1)
Basophils Relative: 0 %
Eosinophils Absolute: 0.1 10*3/uL (ref 0.0–0.5)
Eosinophils Relative: 2 %
HCT: 36.7 % — ABNORMAL LOW (ref 39.0–52.0)
Hemoglobin: 13 g/dL (ref 13.0–17.0)
Immature Granulocytes: 0 %
Lymphocytes Relative: 40 %
Lymphs Abs: 2.3 10*3/uL (ref 0.7–4.0)
MCH: 31.6 pg (ref 26.0–34.0)
MCHC: 35.4 g/dL (ref 30.0–36.0)
MCV: 89.3 fL (ref 80.0–100.0)
Monocytes Absolute: 0.5 10*3/uL (ref 0.1–1.0)
Monocytes Relative: 8 %
Neutro Abs: 3 10*3/uL (ref 1.7–7.7)
Neutrophils Relative %: 50 %
Platelet Count: 175 10*3/uL (ref 150–400)
RBC: 4.11 MIL/uL — ABNORMAL LOW (ref 4.22–5.81)
RDW: 12.5 % (ref 11.5–15.5)
WBC Count: 5.9 10*3/uL (ref 4.0–10.5)
nRBC: 0 % (ref 0.0–0.2)

## 2022-08-05 MED ORDER — LENALIDOMIDE 25 MG PO CAPS: ORAL_CAPSULE | ORAL | 0 refills | Status: DC

## 2022-08-05 MED ORDER — BORTEZOMIB CHEMO SQ INJECTION 3.5 MG (2.5MG/ML)
0.9750 mg/m2 | Freq: Once | INTRAMUSCULAR | Status: AC
  Administered 2022-08-05: 2.5 mg via SUBCUTANEOUS
  Filled 2022-08-05: qty 1

## 2022-08-05 NOTE — Progress Notes (Signed)
[facility] OFFICE PROGRESS NOTE  Patient Care Team: Rometta [name], MD as PCP - General (Internal Medicine)  ASSESSMENT & PLAN:  Multiple myeloma in relapse [facility]) His last myeloma panel showed near complete response He is only taking Revlimid since he was away for vacation and has ran out more than a week ago We will resume treatment today He is reminded to take calcium and vitamin D supplement as well as acyclovir and aspirin He has not obtained dental clearance and we will continue to hold and not give him Zometa  No orders of the defined types were placed in this encounter.   All questions were answered. The patient knows to call the clinic with any problems, questions or concerns. The total time spent in the appointment was 20 minutes encounter with patients including review of chart and various tests results, discussions about plan of care and coordination of care plan   Artis Delay, MD 08/05/2022 10:07 AM  INTERVAL HISTORY: Please see below for problem oriented charting. he returns for treatment follow-up He returns after his vacation He missed approximately 1 week of Revlimid Denies peripheral neuropathy  REVIEW OF SYSTEMS:   Constitutional: Denies fevers, chills or abnormal weight loss Eyes: Denies blurriness of vision Ears, nose, mouth, throat, and face: Denies mucositis or sore throat Respiratory: Denies cough, dyspnea or wheezes Cardiovascular: Denies palpitation, chest discomfort or lower extremity swelling Gastrointestinal:  Denies nausea, heartburn or change in bowel habits Skin: Denies abnormal skin rashes Lymphatics: Denies new lymphadenopathy or easy bruising Neurological:Denies numbness, tingling or new weaknesses Behavioral/Psych: Mood is stable, no new changes  All other systems were reviewed with the patient and are negative.  I have reviewed the past medical history, past surgical history, social history and family history with the  patient and they are unchanged from previous note.  ALLERGIES:  is allergic to daratumumab and heparin.  MEDICATIONS:  Current Outpatient Medications  Medication Sig Dispense Refill   acyclovir (ZOVIRAX) 400 MG tablet Take 1 tablet (400 mg total) by mouth 2 (two) times daily. 60 tablet 11   amoxicillin (AMOXIL) 500 MG capsule Take 1 capsule (500 mg total) by mouth 2 (two) times daily. 14 capsule 0   aspirin EC 81 MG tablet Take 81 mg by mouth daily. Swallow whole.     calcium carbonate (TUMS - DOSED IN MG ELEMENTAL CALCIUM) 500 MG chewable tablet Chew 1 tablet by mouth 3 (three) times daily.     cholecalciferol (VITAMIN D3) 25 MCG (1000 UT) tablet Take 1,000 Units by mouth daily.     dexamethasone (DECADRON) 4 MG tablet Take 3 tablets (12 mg total) by mouth once a week. Take 3 tablets weekly on Monday mornings with food 20 tablet 11   lenalidomide (REVLIMID) 25 MG capsule Take 1 capsule daily for 21 days, then stop 7 days, for cycle of every 28 days 21 capsule 0   ondansetron (ZOFRAN) 8 MG tablet Take 1 tablet (8 mg total) by mouth every 8 (eight) hours as needed for nausea or vomiting. 30 tablet 1   pantoprazole (PROTONIX) 40 MG tablet Take 1 tablet (40 mg total) by mouth daily. 60 tablet 1   prochlorperazine (COMPAZINE) 10 MG tablet Take 1 tablet (10 mg total) by mouth every 6 (six) hours as needed for nausea or vomiting. 30 tablet 1   No current facility-administered medications for this visit.    SUMMARY OF ONCOLOGIC HISTORY: Oncology History  Multiple myeloma in relapse (HCC)  06/23/2015 -  06/28/2015 Hospital Admission   The patient was admitted to the hospital due to gait ataxia and back pain. He was subsequently found to have cord compression underwent surgery and was discharged home   06/24/2015 Imaging   Abnormal appearance of the T6 vertebral body, highly suspicious for possible osseous metastasis. Associated pathologic fracture withup to 30% height loss. There is associated  abnormal soft tissue density within the ventral epidural space,   06/24/2015 Imaging   MRI lumbar: Focal osseous lesion with abnormal enhancement involving the right pedicle of L3, suspicious for possible osseous metastasisgiven the findings in the thoracic spine. Question additional focal lesion within the right iliac wing as above.     06/25/2015 Pathology Results   Accession: [id number] bone biopsy come from plasma cell neoplasm.   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 Zometa is not given due to inability to get dental clearance   12/14/2015 - 04/24/2016 Chemotherapy   He is started on maintenance treatment with Revlimid only   12/18/2015 Imaging   MRI thoracic and lumbar spine showed numerous enhancing foci throughout the thoracic and lumbar spine with several new small foci in the lumbar spine in comparison with prior MRI compatible with metastatic disease. Stable loss of height of the T3, T4, and T6 vertebral bodies and new postsurgical changes related to T6 laminectomy. No significant epidural disease or evidence for cord compression. No abnormal enhancement of the spinal cord or cauda equina.   05/02/2016 Bone Marrow Biopsy   Outside bone marrow biopsy showed 20% myeloma involvement   05/16/2016 Procedure   Successful placement of a right internal jugular approach power injectable Port-A-Cath. The catheter is ready for immediate use.   05/21/2016 - 07/03/2016 Chemotherapy   He received Kyprolis, Cytoxan and dexamethasone    07/08/2016 Procedure   Status post CT-guided bone marrow biopsy, with tissue specimen sent to pathology for complete histopathologic analysis   07/08/2016 Bone Marrow Biopsy    Bone Marrow, Aspirate,Biopsy, and Clot BONE MARROW: - MILDLY HYPERCELLULAR MARROW (60%) WITH PLASMA CELL NEOPLASM - SEE COMMENT PERIPHERAL BLOOD: - NORMOCYTIC ANEMIA Diagnosis Note The marrow is hypercellular with lambda-restricted plasma cells consistent with persistence of the patient's previously diagnosed plasma cell neoplasm. The plasma cells comprise approximately 10-15% of the total marrow cellularity, are enlarged, and arranged in clusters.   08/14/2016 Miscellaneous   He received conditioning treatment with melphalan   08/15/2016 Bone Marrow Transplant   He received autologous stem cell transplant   08/24/2016 - 08/29/2016 Hospital Admission   His post-transplant course was complicated by E-Coli bacteremia   11/26/2016 PET scan   PET CT at [facility] 1. Technically limited study due to soft tissue uptake. 2. New hypermetabolic uptake at C7 spinous process and left proximal femur that is of questionable significance in absence of underlying CT correlate. Further assessment with whole body bone scan may be considered. 2. Redemonstrated nonhypermetabolic multifocal lucent and sclerotic lesions throughout the spine which are similar to prior.    12/02/2016 Bone Marrow Biopsy   He had repeat bone marrow biopsy at [facility] An immunohistochemical stain for CD138 is performed on the bone marrow core biopsy demonstrates increased plasma cells with focal clustering (10-20% overall), which are monotypic for lambda light chain  by in situ hybridization.   01/06/2017 - 06/09/2017 Chemotherapy   He received weekly Dexamethasone, Pomalyst days 1-21 and Daratumumab. From 06/08/17 onwards, he is placed on maintenance Pomalyst only.  He self discontinue Pomalyst in February 2021   07/23/2017 Procedure   Successful right IJ vein Port-A-Cath explant.   03/22/2021 Imaging   1. New large disc protrusion at L4-5 with severe spinal stenosis. 2. History of multiple myeloma with largely resolved diffuse bone marrow  heterogeneity since 2017. New enhancing lesions in the L4 and L5 vertebral bodies without acute fracture. 3. Chronic thoracic compression fractures including severe T6 vertebral body height loss which has progressed from 2017. Resolved epidural tumor.     11/26/2021 PET scan   1. Hypermetabolic patchy lytic L5 vertebral lesion compatible with metabolically active multiple myeloma. 2. No additional sites of hypermetabolic skeletal or extraskeletal myeloma. 3. Mild colonic diverticulosis.   11/27/2021 - 11/27/2021 Chemotherapy   Patient is on Treatment Plan : MYELOMA Daratumumab IV + Pomalidomide + Dexamethasone q28d x 7 cycles     11/27/2021 - 05/07/2022 Chemotherapy   Patient is on Treatment Plan : MYELOMA RELAPSED REFRACTORY Daratumumab SQ + Pomalidomide + Dexamethasone (DaraPd) q28d     05/20/2022 -  Chemotherapy   Patient is on Treatment Plan : MYELOMA  RVD SQ q21d x 4 cycles       PHYSICAL EXAMINATION: ECOG PERFORMANCE STATUS: 0 - Asymptomatic  Vitals:   08/05/22 0940  BP: (!) 146/93  Pulse: 75  Resp: 17  Temp: 98.6 F (37 C)  SpO2: 98%   Filed Weights   08/05/22 0940  Weight: 255 lb 3.2 oz (115.8 kg)    GENERAL:alert, no distress and comfortable NEURO: alert & oriented x 3 with fluent speech, no focal motor/sensory deficits  LABORATORY DATA:  I have reviewed the data as listed    Component Value Date/Time   NA 141 06/17/2022 0943   NA 139 01/27/2017 0809   K 3.7 06/17/2022 0943   K 4.1 01/27/2017 0809   CL 106 06/17/2022 0943   CO2 28 06/17/2022 0943   CO2 23 01/27/2017 0809   GLUCOSE 101 (H) 06/17/2022 0943   GLUCOSE 122 01/27/2017 0809   BUN 15 06/17/2022 0943   BUN 14.3 01/27/2017 0809   CREATININE 1.03 06/17/2022 0943   CREATININE 0.9 01/27/2017 0809   CALCIUM 9.4 06/17/2022 0943   CALCIUM 9.1 01/27/2017 0809   PROT 6.3 (L) 06/17/2022 0943   PROT 6.2 (L) 01/27/2017 0809   ALBUMIN 4.1 06/17/2022 0943   ALBUMIN 3.7 01/27/2017 0809   AST 15 06/17/2022  0943   AST 10 01/27/2017 0809   ALT 17 06/17/2022 0943   ALT 18 01/27/2017 0809   ALKPHOS 40 06/17/2022 0943   ALKPHOS 42 01/27/2017 0809   BILITOT 0.5 06/17/2022 0943   BILITOT 0.72 01/27/2017 0809   GFRNONAA >60 06/17/2022 0943   GFRAA >60 10/18/2019 1017    No results found for: "SPEP", "UPEP"  Lab Results  Component Value Date   WBC 5.9 08/05/2022   NEUTROABS 3.0 08/05/2022   HGB 13.0 08/05/2022   HCT 36.7 (L) 08/05/2022   MCV 89.3 08/05/2022   PLT 175 08/05/2022      Chemistry      Component Value Date/Time   NA 141 06/17/2022 0943   NA 139 01/27/2017 0809   K 3.7 06/17/2022 0943   K 4.1 01/27/2017 0809   CL 106 06/17/2022 0943   CO2 28 06/17/2022 0943   CO2 23  01/27/2017 0809   BUN 15 06/17/2022 0943   BUN 14.3 01/27/2017 0809   CREATININE 1.03 06/17/2022 0943   CREATININE 0.9 01/27/2017 0809      Component Value Date/Time   CALCIUM 9.4 06/17/2022 0943   CALCIUM 9.1 01/27/2017 0809   ALKPHOS 40 06/17/2022 0943   ALKPHOS 42 01/27/2017 0809   AST 15 06/17/2022 0943   AST 10 01/27/2017 0809   ALT 17 06/17/2022 0943   ALT 18 01/27/2017 0809   BILITOT 0.5 06/17/2022 0943   BILITOT 0.72 01/27/2017 0809

## 2022-08-05 NOTE — Progress Notes (Signed)
Patient took dex at home prior to treatment today.

## 2022-08-05 NOTE — Patient Instructions (Signed)
[facility]  Discharge Instructions: Thank you for choosing [facility] to provide your oncology and hematology care.   If you have a lab appointment with the Cancer Center, please go directly to the Cancer Center and check in at the registration area.   Wear comfortable clothing and clothing appropriate for easy access to any Portacath or PICC line.   We strive to give you quality time with your provider. You may need to reschedule your appointment if you arrive late (15 or more minutes).  Arriving late affects you and other patients whose appointments are after yours.  Also, if you miss three or more appointments without notifying the office, you may be dismissed from the clinic at the provider's discretion.      For prescription refill requests, have your pharmacy contact our office and allow 72 hours for refills to be completed.    Today you received the following chemotherapy and/or immunotherapy agents: Velcade     To help prevent nausea and vomiting after your treatment, we encourage you to take your nausea medication as directed.  BELOW ARE SYMPTOMS THAT SHOULD BE REPORTED IMMEDIATELY: *FEVER GREATER THAN 100.4 F (38 C) OR HIGHER *CHILLS OR SWEATING *NAUSEA AND VOMITING THAT IS NOT CONTROLLED WITH YOUR NAUSEA MEDICATION *UNUSUAL SHORTNESS OF BREATH *UNUSUAL BRUISING OR BLEEDING *URINARY PROBLEMS (pain or burning when urinating, or frequent urination) *BOWEL PROBLEMS (unusual diarrhea, constipation, pain near the anus) TENDERNESS IN MOUTH AND THROAT WITH OR WITHOUT PRESENCE OF ULCERS (sore throat, sores in mouth, or a toothache) UNUSUAL RASH, SWELLING OR PAIN  UNUSUAL VAGINAL DISCHARGE OR ITCHING   Items with * indicate a potential emergency and should be followed up as soon as possible or go to the Emergency Department if any problems should occur.  Please show the CHEMOTHERAPY ALERT CARD or IMMUNOTHERAPY ALERT CARD at check-in  to the Emergency Department and triage nurse.  Should you have questions after your visit or need to cancel or reschedule your appointment, please contact [facility]  Dept: [phone]  and follow the prompts.  Office hours are 8:00 a.m. to 4:30 p.m. Monday - Friday. Please note that voicemails left after 4:00 p.m. may not be returned until the following business day.  We are closed weekends and major holidays. You have access to a nurse at all times for urgent questions. Please call the main number to the clinic Dept: [phone] and follow the prompts.   For any non-urgent questions, you may also contact your provider using MyChart. We now offer e-Visits for anyone 18 and older to request care online for non-urgent symptoms. For details visit [email or web address].   Also download the MyChart app! Go to the app store, search "MyChart", open the app, select [facility], and log in with your MyChart username and password.   

## 2022-08-05 NOTE — Assessment & Plan Note (Signed)
His last myeloma panel showed near complete response He is only taking Revlimid since he was away for vacation and has ran out more than a week ago We will resume treatment today He is reminded to take calcium and vitamin D supplement as well as acyclovir and aspirin He has not obtained dental clearance and we will continue to hold and not give him Zometa

## 2022-08-06 ENCOUNTER — Other Ambulatory Visit: Payer: Self-pay

## 2022-08-12 ENCOUNTER — Inpatient Hospital Stay: Payer: No Typology Code available for payment source

## 2022-08-12 ENCOUNTER — Other Ambulatory Visit: Payer: Self-pay

## 2022-08-12 VITALS — BP 137/91 | HR 92 | Temp 98.0°F | Resp 16 | Wt 251.8 lb

## 2022-08-12 DIAGNOSIS — Z5112 Encounter for antineoplastic immunotherapy: Secondary | ICD-10-CM | POA: Diagnosis not present

## 2022-08-12 DIAGNOSIS — C9002 Multiple myeloma in relapse: Secondary | ICD-10-CM

## 2022-08-12 LAB — CMP (CANCER CENTER ONLY)
ALT: 22 U/L (ref 0–44)
AST: 12 U/L — ABNORMAL LOW (ref 15–41)
Albumin: 4.1 g/dL (ref 3.5–5.0)
Alkaline Phosphatase: 54 U/L (ref 38–126)
Anion gap: 5 (ref 5–15)
BUN: 10 mg/dL (ref 6–20)
CO2: 30 mmol/L (ref 22–32)
Calcium: 9.6 mg/dL (ref 8.9–10.3)
Chloride: 104 mmol/L (ref 98–111)
Creatinine: 1.05 mg/dL (ref 0.61–1.24)
GFR, Estimated: >60 mL/min (ref >60)
Glucose, Bld: 95 mg/dL (ref 70–99)
Potassium: 4 mmol/L (ref 3.5–5.1)
Sodium: 139 mmol/L (ref 135–145)
Total Bilirubin: 0.8 mg/dL (ref 0.3–1.2)
Total Protein: 6.4 g/dL — ABNORMAL LOW (ref 6.5–8.1)

## 2022-08-12 LAB — CBC WITH DIFFERENTIAL (CANCER CENTER ONLY)
Abs Immature Granulocytes: 0.01 10*3/uL (ref 0.00–0.07)
Basophils Absolute: 0 10*3/uL (ref 0.0–0.1)
Basophils Relative: 0 %
Eosinophils Absolute: 0.1 10*3/uL (ref 0.0–0.5)
Eosinophils Relative: 2 %
HCT: 36.2 % — ABNORMAL LOW (ref 39.0–52.0)
Hemoglobin: 12.6 g/dL — ABNORMAL LOW (ref 13.0–17.0)
Immature Granulocytes: 0 %
Lymphocytes Relative: 25 %
Lymphs Abs: 1.4 10*3/uL (ref 0.7–4.0)
MCH: 31 pg (ref 26.0–34.0)
MCHC: 34.8 g/dL (ref 30.0–36.0)
MCV: 89.2 fL (ref 80.0–100.0)
Monocytes Absolute: 0.6 10*3/uL (ref 0.1–1.0)
Monocytes Relative: 10 %
Neutro Abs: 3.6 10*3/uL (ref 1.7–7.7)
Neutrophils Relative %: 63 %
Platelet Count: 176 10*3/uL (ref 150–400)
RBC: 4.06 MIL/uL — ABNORMAL LOW (ref 4.22–5.81)
RDW: 12.9 % (ref 11.5–15.5)
WBC Count: 5.7 10*3/uL (ref 4.0–10.5)
nRBC: 0 % (ref 0.0–0.2)

## 2022-08-12 MED ORDER — BORTEZOMIB CHEMO SQ INJECTION 3.5 MG (2.5MG/ML)
0.9750 mg/m2 | Freq: Once | INTRAMUSCULAR | Status: AC
  Administered 2022-08-12: 2.5 mg via SUBCUTANEOUS
  Filled 2022-08-12: qty 1

## 2022-08-12 NOTE — Patient Instructions (Signed)
 [facility]  Discharge Instructions: Thank you for choosing [facility] to provide your oncology and hematology care.   If you have a lab appointment with the Cancer Center, please go directly to the Cancer Center and check in at the registration area.   Wear comfortable clothing and clothing appropriate for easy access to any Portacath or PICC line.   We strive to give you quality time with your provider. You may need to reschedule your appointment if you arrive late (15 or more minutes).  Arriving late affects you and other patients whose appointments are after yours.  Also, if you miss three or more appointments without notifying the office, you may be dismissed from the clinic at the provider's discretion.      For prescription refill requests, have your pharmacy contact our office and allow 72 hours for refills to be completed.    Today you received the following chemotherapy and/or immunotherapy agents: Velcade        To help prevent nausea and vomiting after your treatment, we encourage you to take your nausea medication as directed.  BELOW ARE SYMPTOMS THAT SHOULD BE REPORTED IMMEDIATELY: *FEVER GREATER THAN 100.4 F (38 C) OR HIGHER *CHILLS OR SWEATING *NAUSEA AND VOMITING THAT IS NOT CONTROLLED WITH YOUR NAUSEA MEDICATION *UNUSUAL SHORTNESS OF BREATH *UNUSUAL BRUISING OR BLEEDING *URINARY PROBLEMS (pain or burning when urinating, or frequent urination) *BOWEL PROBLEMS (unusual diarrhea, constipation, pain near the anus) TENDERNESS IN MOUTH AND THROAT WITH OR WITHOUT PRESENCE OF ULCERS (sore throat, sores in mouth, or a toothache) UNUSUAL RASH, SWELLING OR PAIN  UNUSUAL VAGINAL DISCHARGE OR ITCHING   Items with * indicate a potential emergency and should be followed up as soon as possible or go to the Emergency Department if any problems should occur.  Please show the CHEMOTHERAPY ALERT CARD or IMMUNOTHERAPY ALERT CARD at  check-in to the Emergency Department and triage nurse.  Should you have questions after your visit or need to cancel or reschedule your appointment, please contact [facility]  Dept: [phone]  and follow the prompts.  Office hours are 8:00 a.m. to 4:30 p.m. Monday - Friday. Please note that voicemails left after 4:00 p.m. may not be returned until the following business day.  We are closed weekends and major holidays. You have access to a nurse at all times for urgent questions. Please call the main number to the clinic Dept: [phone] and follow the prompts.   For any non-urgent questions, you may also contact your provider using MyChart. We now offer e-Visits for anyone 18 and older to request care online for non-urgent symptoms. For details visit [email or web address].   Also download the MyChart app! Go to the app store, search "MyChart", open the app, select [facility], and log in with your MyChart username and password.  

## 2022-08-12 NOTE — Progress Notes (Signed)
Pt presented to infusion today with c/o worsened persistent headache 6/10 and feels like pressure especially when he leans forward. Pt's BP is 137/91. Pt had c/o a rash that started after last tx upon assessment the rash appears to be mosquito bites, Pt stated "It is not bug bites, but it is all over my legs and some on my arms."  This RN made Dr. Bertis Ruddy aware. Per Dr. Bertis Ruddy Pt can apply OTC benadryl cream on rash and can take tylenol for headache, if the headache does not relieve with tylenol Pt to see if PCP for further evaluation.  This RN educated Pt on MD recommendations. Pt verbalized understanding and was agreeable.   Pt informed this RN that he took dexamethasone at home prior to appt today.

## 2022-08-13 ENCOUNTER — Other Ambulatory Visit: Payer: Self-pay

## 2022-08-16 IMAGING — XA Imaging study
2 series · 2 of 2 positions shown · non-contrast
Comparison: none

CLINICAL DATA: 50-year-old male with lumbosacral spondylosis
without myelopathy.

[Series 1: ortho standard · 1 of 1 slices shown (1 of 2)]
[im 1/1]
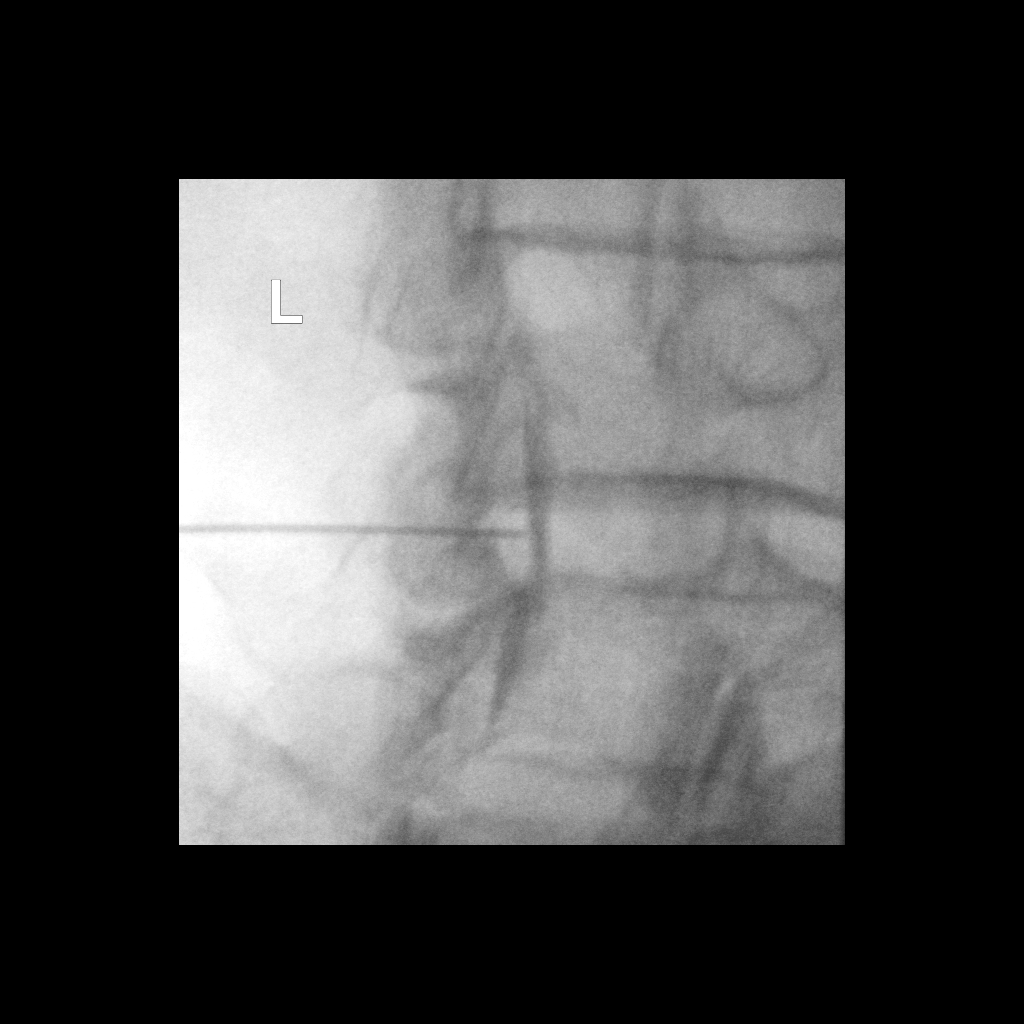

[Series 2: ortho standard · 1 of 1 slices shown (2 of 2)]
[im 1/1]
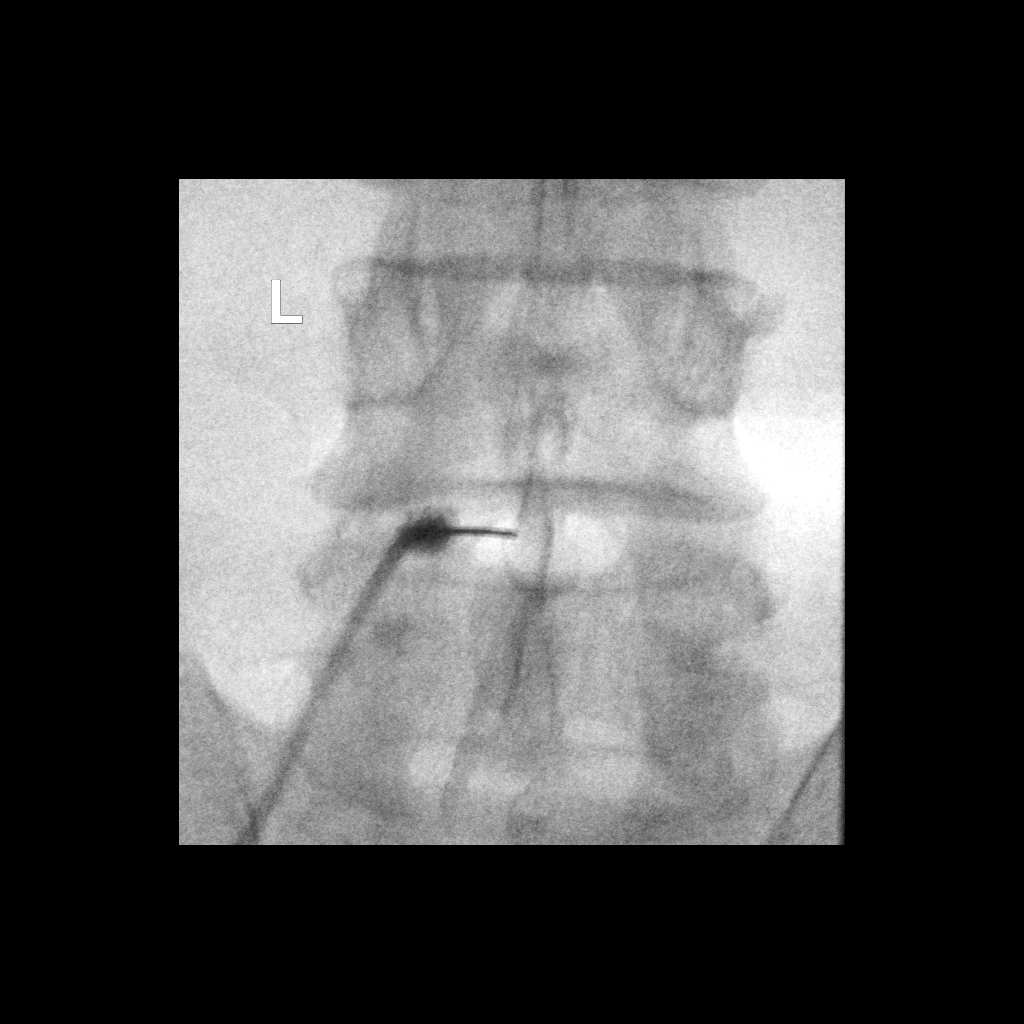

[2 of 2 positions shown; findings below may reference images not displayed]

FLUOROSCOPY:
Radiation Exposure Index (as provided by the fluoroscopic device):
3.3 mGy Kerma

PROCEDURE:
The procedure, risks, benefits, and alternatives were explained to
the patient. Questions regarding the procedure were encouraged and
answered. The patient understands and consents to the procedure.

LUMBAR EPIDURAL INJECTION:

An interlaminar approach was performed on left at L4-L5. The
overlying skin was cleansed and anesthetized. A 20 gauge epidural
needle was advanced using loss-of-resistance technique.

DIAGNOSTIC EPIDURAL INJECTION:

Injection of Isovue-M 200 shows a good epidural pattern with spread
above and below the level of needle placement, primarily on the left
no vascular opacification is seen.

THERAPEUTIC EPIDURAL INJECTION:

80 mg of Depo-Medrol mixed with 2 mL 1% lidocaine were instilled.
The procedure was well-tolerated, and the patient was discharged
thirty minutes following the injection in good condition.

COMPLICATIONS:
None immediate.
IMPRESSION: Technically successful epidural injection on the left L4-L5.

## 2022-08-19 ENCOUNTER — Other Ambulatory Visit: Payer: Self-pay

## 2022-08-19 ENCOUNTER — Inpatient Hospital Stay: Payer: No Typology Code available for payment source

## 2022-08-19 VITALS — BP 134/86 | HR 89 | Temp 98.5°F | Resp 17 | Wt 254.0 lb

## 2022-08-19 DIAGNOSIS — Z5112 Encounter for antineoplastic immunotherapy: Secondary | ICD-10-CM | POA: Diagnosis not present

## 2022-08-19 DIAGNOSIS — C9002 Multiple myeloma in relapse: Secondary | ICD-10-CM

## 2022-08-19 LAB — CBC WITH DIFFERENTIAL (CANCER CENTER ONLY)
Abs Immature Granulocytes: 0.01 10*3/uL (ref 0.00–0.07)
Basophils Absolute: 0 10*3/uL (ref 0.0–0.1)
Basophils Relative: 0 %
Eosinophils Absolute: 0.2 10*3/uL (ref 0.0–0.5)
Eosinophils Relative: 2 %
HCT: 36.6 % — ABNORMAL LOW (ref 39.0–52.0)
Hemoglobin: 13.1 g/dL (ref 13.0–17.0)
Immature Granulocytes: 0 %
Lymphocytes Relative: 21 %
Lymphs Abs: 1.6 10*3/uL (ref 0.7–4.0)
MCH: 32 pg (ref 26.0–34.0)
MCHC: 35.8 g/dL (ref 30.0–36.0)
MCV: 89.3 fL (ref 80.0–100.0)
Monocytes Absolute: 0.4 10*3/uL (ref 0.1–1.0)
Monocytes Relative: 6 %
Neutro Abs: 5.4 10*3/uL (ref 1.7–7.7)
Neutrophils Relative %: 71 %
Platelet Count: 175 10*3/uL (ref 150–400)
RBC: 4.1 MIL/uL — ABNORMAL LOW (ref 4.22–5.81)
RDW: 13 % (ref 11.5–15.5)
WBC Count: 7.6 10*3/uL (ref 4.0–10.5)
nRBC: 0 % (ref 0.0–0.2)

## 2022-08-19 LAB — CMP (CANCER CENTER ONLY)
ALT: 16 U/L (ref 0–44)
AST: 12 U/L — ABNORMAL LOW (ref 15–41)
Albumin: 4.1 g/dL (ref 3.5–5.0)
Alkaline Phosphatase: 53 U/L (ref 38–126)
Anion gap: 6 (ref 5–15)
BUN: 13 mg/dL (ref 6–20)
CO2: 27 mmol/L (ref 22–32)
Calcium: 8.8 mg/dL — ABNORMAL LOW (ref 8.9–10.3)
Chloride: 105 mmol/L (ref 98–111)
Creatinine: 1.05 mg/dL (ref 0.61–1.24)
GFR, Estimated: >60 mL/min (ref >60)
Glucose, Bld: 95 mg/dL (ref 70–99)
Potassium: 3.4 mmol/L — ABNORMAL LOW (ref 3.5–5.1)
Sodium: 138 mmol/L (ref 135–145)
Total Bilirubin: 0.5 mg/dL (ref 0.3–1.2)
Total Protein: 6.3 g/dL — ABNORMAL LOW (ref 6.5–8.1)

## 2022-08-19 MED ORDER — BORTEZOMIB CHEMO SQ INJECTION 3.5 MG (2.5MG/ML)
0.9750 mg/m2 | Freq: Once | INTRAMUSCULAR | Status: AC
  Administered 2022-08-19: 2.5 mg via SUBCUTANEOUS
  Filled 2022-08-19: qty 1

## 2022-08-19 NOTE — Patient Instructions (Signed)
[facility]  Discharge Instructions: Thank you for choosing [facility] to provide your oncology and hematology care.   If you have a lab appointment with the [facility], please go directly to the [facility] and check in at the registration area.   Wear comfortable clothing and clothing appropriate for easy access to any Portacath or PICC line.   We strive to give you quality time with your provider. You may need to reschedule your appointment if you arrive late (15 or more minutes).  Arriving late affects you and other patients whose appointments are after yours.  Also, if you miss three or more appointments without notifying the office, you may be dismissed from the clinic at the provider's discretion.      For prescription refill requests, have your pharmacy contact our office and allow 72 hours for refills to be completed.    Today you received the following chemotherapy and/or immunotherapy agents: Gemzar      To help prevent nausea and vomiting after your treatment, we encourage you to take your nausea medication as directed.  BELOW ARE SYMPTOMS THAT SHOULD BE REPORTED IMMEDIATELY: *FEVER GREATER THAN 100.4 F (38 C) OR HIGHER *CHILLS OR SWEATING *NAUSEA AND VOMITING THAT IS NOT CONTROLLED WITH YOUR NAUSEA MEDICATION *UNUSUAL SHORTNESS OF BREATH *UNUSUAL BRUISING OR BLEEDING *URINARY PROBLEMS (pain or burning when urinating, or frequent urination) *BOWEL PROBLEMS (unusual diarrhea, constipation, pain near the anus) TENDERNESS IN MOUTH AND THROAT WITH OR WITHOUT PRESENCE OF ULCERS (sore throat, sores in mouth, or a toothache) UNUSUAL RASH, SWELLING OR PAIN  UNUSUAL VAGINAL DISCHARGE OR ITCHING   Items with * indicate a potential emergency and should be followed up as soon as possible or go to the Emergency Department if any problems should occur.  Please show the CHEMOTHERAPY ALERT CARD or IMMUNOTHERAPY ALERT CARD at check-in  to the Emergency Department and triage nurse.  Should you have questions after your visit or need to cancel or reschedule your appointment, please contact [facility]  Dept: [phone]  and follow the prompts.  Office hours are 8:00 a.m. to 4:30 p.m. Monday - Friday. Please note that voicemails left after 4:00 p.m. may not be returned until the following business day.  We are closed weekends and major holidays. You have access to a nurse at all times for urgent questions. Please call the main number to the clinic Dept: [phone] and follow the prompts.   For any non-urgent questions, you may also contact your provider using MyChart. We now offer e-Visits for anyone 40 and older to request care online for non-urgent symptoms. For details visit [email or web address].   Also download the MyChart app! Go to the app store, search "MyChart", open the app, select [facility], and log in with your MyChart username and password.

## 2022-08-20 LAB — KAPPA/LAMBDA LIGHT CHAINS
Kappa free light chain: 19.3 mg/L (ref 3.3–19.4)
Kappa, lambda light chain ratio: 0.12 — ABNORMAL LOW (ref 0.26–1.65)
Lambda free light chains: 158.3 mg/L — ABNORMAL HIGH (ref 5.7–26.3)

## 2022-08-22 LAB — MULTIPLE MYELOMA PANEL, SERUM
Albumin SerPl Elph-Mcnc: 3.6 g/dL (ref 2.9–4.4)
Albumin/Glob SerPl: 1.5 (ref 0.7–1.7)
Alpha 1: 0.2 g/dL (ref 0.0–0.4)
Alpha2 Glob SerPl Elph-Mcnc: 0.7 g/dL (ref 0.4–1.0)
B-Globulin SerPl Elph-Mcnc: 0.8 g/dL (ref 0.7–1.3)
Gamma Glob SerPl Elph-Mcnc: 0.7 g/dL (ref 0.4–1.8)
Globulin, Total: 2.5 g/dL (ref 2.2–3.9)
IgA: 39 mg/dL — ABNORMAL LOW (ref 90–386)
IgG (Immunoglobin G), Serum: 810 mg/dL (ref 603–1613)
IgM (Immunoglobulin M), Srm: 40 mg/dL (ref 20–172)
Total Protein ELP: 6.1 g/dL (ref 6.0–8.5)

## 2022-08-26 ENCOUNTER — Other Ambulatory Visit: Payer: Self-pay

## 2022-08-26 ENCOUNTER — Inpatient Hospital Stay: Payer: No Typology Code available for payment source

## 2022-08-26 VITALS — BP 133/92 | HR 78 | Temp 98.3°F | Resp 16 | Wt 256.0 lb

## 2022-08-26 DIAGNOSIS — C9002 Multiple myeloma in relapse: Secondary | ICD-10-CM

## 2022-08-26 DIAGNOSIS — Z5112 Encounter for antineoplastic immunotherapy: Secondary | ICD-10-CM | POA: Diagnosis not present

## 2022-08-26 LAB — CBC WITH DIFFERENTIAL (CANCER CENTER ONLY)
Abs Immature Granulocytes: 0.02 10*3/uL (ref 0.00–0.07)
Basophils Absolute: 0 10*3/uL (ref 0.0–0.1)
Basophils Relative: 1 %
Eosinophils Absolute: 0.2 10*3/uL (ref 0.0–0.5)
Eosinophils Relative: 5 %
HCT: 35.6 % — ABNORMAL LOW (ref 39.0–52.0)
Hemoglobin: 12.7 g/dL — ABNORMAL LOW (ref 13.0–17.0)
Immature Granulocytes: 0 %
Lymphocytes Relative: 36 %
Lymphs Abs: 1.8 10*3/uL (ref 0.7–4.0)
MCH: 32.6 pg (ref 26.0–34.0)
MCHC: 35.7 g/dL (ref 30.0–36.0)
MCV: 91.3 fL (ref 80.0–100.0)
Monocytes Absolute: 0.8 10*3/uL (ref 0.1–1.0)
Monocytes Relative: 15 %
Neutro Abs: 2.2 10*3/uL (ref 1.7–7.7)
Neutrophils Relative %: 43 %
Platelet Count: 178 10*3/uL (ref 150–400)
RBC: 3.9 MIL/uL — ABNORMAL LOW (ref 4.22–5.81)
RDW: 13.5 % (ref 11.5–15.5)
WBC Count: 5 10*3/uL (ref 4.0–10.5)
nRBC: 0 % (ref 0.0–0.2)

## 2022-08-26 LAB — CMP (CANCER CENTER ONLY)
ALT: 21 U/L (ref 0–44)
AST: 15 U/L (ref 15–41)
Albumin: 4 g/dL (ref 3.5–5.0)
Alkaline Phosphatase: 47 U/L (ref 38–126)
Anion gap: 5 (ref 5–15)
BUN: 18 mg/dL (ref 6–20)
CO2: 28 mmol/L (ref 22–32)
Calcium: 8.7 mg/dL — ABNORMAL LOW (ref 8.9–10.3)
Chloride: 104 mmol/L (ref 98–111)
Creatinine: 1.08 mg/dL (ref 0.61–1.24)
GFR, Estimated: >60 mL/min (ref >60)
Glucose, Bld: 100 mg/dL — ABNORMAL HIGH (ref 70–99)
Potassium: 3.9 mmol/L (ref 3.5–5.1)
Sodium: 137 mmol/L (ref 135–145)
Total Bilirubin: 0.5 mg/dL (ref 0.3–1.2)
Total Protein: 6 g/dL — ABNORMAL LOW (ref 6.5–8.1)

## 2022-08-26 MED ORDER — BORTEZOMIB CHEMO SQ INJECTION 3.5 MG (2.5MG/ML)
0.9750 mg/m2 | Freq: Once | INTRAMUSCULAR | Status: AC
  Administered 2022-08-26: 2.5 mg via SUBCUTANEOUS
  Filled 2022-08-26: qty 1

## 2022-08-26 NOTE — Patient Instructions (Signed)
[facility]  Discharge Instructions: Thank you for choosing [facility] to provide your oncology and hematology care.   If you have a lab appointment with the Cancer Center, please go directly to the Cancer Center and check in at the registration area.   Wear comfortable clothing and clothing appropriate for easy access to any Portacath or PICC line.   We strive to give you quality time with your provider. You may need to reschedule your appointment if you arrive late (15 or more minutes).  Arriving late affects you and other patients whose appointments are after yours.  Also, if you miss three or more appointments without notifying the office, you may be dismissed from the clinic at the provider's discretion.      For prescription refill requests, have your pharmacy contact our office and allow 72 hours for refills to be completed.    Today you received the following chemotherapy and/or immunotherapy agents: Velcade        To help prevent nausea and vomiting after your treatment, we encourage you to take your nausea medication as directed.  BELOW ARE SYMPTOMS THAT SHOULD BE REPORTED IMMEDIATELY: *FEVER GREATER THAN 100.4 F (38 C) OR HIGHER *CHILLS OR SWEATING *NAUSEA AND VOMITING THAT IS NOT CONTROLLED WITH YOUR NAUSEA MEDICATION *UNUSUAL SHORTNESS OF BREATH *UNUSUAL BRUISING OR BLEEDING *URINARY PROBLEMS (pain or burning when urinating, or frequent urination) *BOWEL PROBLEMS (unusual diarrhea, constipation, pain near the anus) TENDERNESS IN MOUTH AND THROAT WITH OR WITHOUT PRESENCE OF ULCERS (sore throat, sores in mouth, or a toothache) UNUSUAL RASH, SWELLING OR PAIN  UNUSUAL VAGINAL DISCHARGE OR ITCHING   Items with * indicate a potential emergency and should be followed up as soon as possible or go to the Emergency Department if any problems should occur.  Please show the CHEMOTHERAPY ALERT CARD or IMMUNOTHERAPY ALERT CARD at  check-in to the Emergency Department and triage nurse.  Should you have questions after your visit or need to cancel or reschedule your appointment, please contact [facility]  Dept: [phone]  and follow the prompts.  Office hours are 8:00 a.m. to 4:30 p.m. Monday - Friday. Please note that voicemails left after 4:00 p.m. may not be returned until the following business day.  We are closed weekends and major holidays. You have access to a nurse at all times for urgent questions. Please call the main number to the clinic Dept: [phone] and follow the prompts.   For any non-urgent questions, you may also contact your provider using MyChart. We now offer e-Visits for anyone 18 and older to request care online for non-urgent symptoms. For details visit [email or web address].   Also download the MyChart app! Go to the app store, search "MyChart", open the app, select [facility], and log in with your MyChart username and password.  

## 2022-08-26 NOTE — Progress Notes (Signed)
Patient reports experiencing some dizziness, lightheadedness on the 3rd and 4th days after each treatment, resolving on the 5th day.  Patient forgot to tell Dr. Bertis Ruddy about this.  Per Dr. Mosetta Putt, OK to treat today, and patient instructed to monitor BP at home daily.  Patient instructed to record BP readings and bring recordings with him to next MD visit per MD.  Patient verbalized understanding.

## 2022-08-26 NOTE — Progress Notes (Signed)
Patient states he took his dexamethasone at home today prior to arrival.

## 2022-08-30 ENCOUNTER — Other Ambulatory Visit: Payer: Self-pay

## 2022-08-30 MED ORDER — LENALIDOMIDE 25 MG PO CAPS: ORAL_CAPSULE | ORAL | 0 refills | Status: DC

## 2022-09-02 ENCOUNTER — Other Ambulatory Visit: Payer: Self-pay

## 2022-09-02 ENCOUNTER — Inpatient Hospital Stay: Payer: No Typology Code available for payment source | Attending: Internal Medicine

## 2022-09-02 ENCOUNTER — Encounter: Payer: Self-pay | Admitting: Hematology and Oncology

## 2022-09-02 ENCOUNTER — Inpatient Hospital Stay: Payer: No Typology Code available for payment source

## 2022-09-02 ENCOUNTER — Inpatient Hospital Stay (HOSPITAL_BASED_OUTPATIENT_CLINIC_OR_DEPARTMENT_OTHER): Payer: No Typology Code available for payment source | Admitting: Hematology and Oncology

## 2022-09-02 VITALS — BP 146/91 | HR 75 | Temp 97.5°F | Resp 18 | Ht 74.0 in | Wt 251.6 lb

## 2022-09-02 DIAGNOSIS — C9002 Multiple myeloma in relapse: Secondary | ICD-10-CM

## 2022-09-02 DIAGNOSIS — D61818 Other pancytopenia: Secondary | ICD-10-CM

## 2022-09-02 DIAGNOSIS — Z79899 Other long term (current) drug therapy: Secondary | ICD-10-CM | POA: Insufficient documentation

## 2022-09-02 DIAGNOSIS — Z5112 Encounter for antineoplastic immunotherapy: Secondary | ICD-10-CM | POA: Diagnosis not present

## 2022-09-02 LAB — CBC WITH DIFFERENTIAL (CANCER CENTER ONLY)
Abs Immature Granulocytes: 0.01 10*3/uL (ref 0.00–0.07)
Basophils Absolute: 0 10*3/uL (ref 0.0–0.1)
Basophils Relative: 1 %
Eosinophils Absolute: 0.1 10*3/uL (ref 0.0–0.5)
Eosinophils Relative: 4 %
HCT: 35.9 % — ABNORMAL LOW (ref 39.0–52.0)
Hemoglobin: 12.5 g/dL — ABNORMAL LOW (ref 13.0–17.0)
Immature Granulocytes: 0 %
Lymphocytes Relative: 39 %
Lymphs Abs: 1.5 10*3/uL (ref 0.7–4.0)
MCH: 32 pg (ref 26.0–34.0)
MCHC: 34.8 g/dL (ref 30.0–36.0)
MCV: 91.8 fL (ref 80.0–100.0)
Monocytes Absolute: 0.5 10*3/uL (ref 0.1–1.0)
Monocytes Relative: 13 %
Neutro Abs: 1.6 10*3/uL — ABNORMAL LOW (ref 1.7–7.7)
Neutrophils Relative %: 43 %
Platelet Count: 167 10*3/uL (ref 150–400)
RBC: 3.91 MIL/uL — ABNORMAL LOW (ref 4.22–5.81)
RDW: 13.5 % (ref 11.5–15.5)
WBC Count: 3.7 10*3/uL — ABNORMAL LOW (ref 4.0–10.5)
nRBC: 0 % (ref 0.0–0.2)

## 2022-09-02 LAB — CMP (CANCER CENTER ONLY)
ALT: 18 U/L (ref 0–44)
AST: 13 U/L — ABNORMAL LOW (ref 15–41)
Albumin: 4 g/dL (ref 3.5–5.0)
Alkaline Phosphatase: 42 U/L (ref 38–126)
Anion gap: 6 (ref 5–15)
BUN: 17 mg/dL (ref 6–20)
CO2: 27 mmol/L (ref 22–32)
Calcium: 9 mg/dL (ref 8.9–10.3)
Chloride: 106 mmol/L (ref 98–111)
Creatinine: 0.98 mg/dL (ref 0.61–1.24)
GFR, Estimated: >60 mL/min (ref >60)
Glucose, Bld: 109 mg/dL — ABNORMAL HIGH (ref 70–99)
Potassium: 3.7 mmol/L (ref 3.5–5.1)
Sodium: 139 mmol/L (ref 135–145)
Total Bilirubin: 0.5 mg/dL (ref 0.3–1.2)
Total Protein: 6.1 g/dL — ABNORMAL LOW (ref 6.5–8.1)

## 2022-09-02 MED ORDER — BORTEZOMIB CHEMO SQ INJECTION 3.5 MG (2.5MG/ML)
0.9750 mg/m2 | Freq: Once | INTRAMUSCULAR | Status: AC
  Administered 2022-09-02: 2.5 mg via SUBCUTANEOUS
  Filled 2022-09-02: qty 1

## 2022-09-02 NOTE — Assessment & Plan Note (Signed)
He has intermittent mild pancytopenia due to treatment We will proceed with treatment without delay

## 2022-09-02 NOTE — Assessment & Plan Note (Signed)
I have reviewed his recent myeloma panel which show excellent response to therapy We will continue treatment as scheduled Once his light chain levels are lower at around 50, I will then transition him to maintenance treatment

## 2022-09-02 NOTE — Progress Notes (Signed)
Pt confirms he took dexamethasone at home today

## 2022-09-02 NOTE — Progress Notes (Signed)
[facility] OFFICE PROGRESS NOTE  Patient Care Team: Rometta [name], MD as PCP - General (Internal Medicine)  ASSESSMENT & PLAN:  Multiple myeloma in relapse [facility]) I have reviewed his recent myeloma panel which show excellent response to therapy We will continue treatment as scheduled Once his light chain levels are lower at around 50, I will then transition him to maintenance treatment  Pancytopenia, acquired [facility]) He has intermittent mild pancytopenia due to treatment We will proceed with treatment without delay  Orders Placed This Encounter  Procedures   CBC with Differential (Cancer Center Only)    Standing Status:   Future    Standing Expiration Date:   09/17/2023   CMP (Cancer Center only)    Standing Status:   Future    Standing Expiration Date:   09/17/2023   CBC with Differential (Cancer Center Only)    Standing Status:   Future    Standing Expiration Date:   09/24/2023   CMP (Cancer Center only)    Standing Status:   Future    Standing Expiration Date:   09/24/2023   Multiple Myeloma Panel (SPEP&IFE w/QIG)    Standing Status:   Future    Standing Expiration Date:   10/01/2023   Kappa/lambda light chains    Standing Status:   Future    Standing Expiration Date:   10/01/2023   CBC with Differential (Cancer Center Only)    Standing Status:   Future    Standing Expiration Date:   10/01/2023   CMP (Cancer Center only)    Standing Status:   Future    Standing Expiration Date:   10/01/2023    All questions were answered. The patient knows to call the clinic with any problems, questions or concerns. The total time spent in the appointment was 20 minutes encounter with patients including review of chart and various tests results, discussions about plan of care and coordination of care plan   Artis Delay, MD 09/02/2022 11:02 AM  INTERVAL HISTORY: Please see below for problem oriented charting. he returns for treatment follow-up on chemotherapy treatment He had  history of cough but that has resolved His peripheral neuropathy is stable  REVIEW OF SYSTEMS:   Constitutional: Denies fevers, chills or abnormal weight loss Eyes: Denies blurriness of vision Ears, nose, mouth, throat, and face: Denies mucositis or sore throat Respiratory: Denies cough, dyspnea or wheezes Cardiovascular: Denies palpitation, chest discomfort or lower extremity swelling Gastrointestinal:  Denies nausea, heartburn or change in bowel habits Skin: Denies abnormal skin rashes Lymphatics: Denies new lymphadenopathy or easy bruising Neurological:Denies numbness, tingling or new weaknesses Behavioral/Psych: Mood is stable, no new changes  All other systems were reviewed with the patient and are negative.  I have reviewed the past medical history, past surgical history, social history and family history with the patient and they are unchanged from previous note.  ALLERGIES:  is allergic to daratumumab and heparin.  MEDICATIONS:  Current Outpatient Medications  Medication Sig Dispense Refill   acyclovir (ZOVIRAX) 400 MG tablet Take 1 tablet (400 mg total) by mouth 2 (two) times daily. 60 tablet 11   amoxicillin (AMOXIL) 500 MG capsule Take 1 capsule (500 mg total) by mouth 2 (two) times daily. 14 capsule 0   aspirin EC 81 MG tablet Take 81 mg by mouth daily. Swallow whole.     calcium carbonate (TUMS - DOSED IN MG ELEMENTAL CALCIUM) 500 MG chewable tablet Chew 1 tablet by mouth 3 (three) times daily.  cholecalciferol (VITAMIN D3) 25 MCG (1000 UT) tablet Take 1,000 Units by mouth daily.     dexamethasone (DECADRON) 4 MG tablet Take 3 tablets (12 mg total) by mouth once a week. Take 3 tablets weekly on Monday mornings with food 20 tablet 11   lenalidomide (REVLIMID) 25 MG capsule Take 1 capsule daily for 21 days, then stop 7 days, for cycle of every 28 days 21 capsule 0   ondansetron (ZOFRAN) 8 MG tablet Take 1 tablet (8 mg total) by mouth every 8 (eight) hours as needed for  nausea or vomiting. 30 tablet 1   pantoprazole (PROTONIX) 40 MG tablet Take 1 tablet (40 mg total) by mouth daily. 60 tablet 1   prochlorperazine (COMPAZINE) 10 MG tablet Take 1 tablet (10 mg total) by mouth every 6 (six) hours as needed for nausea or vomiting. 30 tablet 1   No current facility-administered medications for this visit.   Facility-Administered Medications Ordered in Other Visits  Medication Dose Route Frequency Provider Last Rate Last Admin   bortezomib SQ (VELCADE) chemo injection (2.5mg /mL concentration) 2.5 mg  0.975 mg/m2 (Treatment Plan Recorded) Subcutaneous Once Artis Delay, MD        SUMMARY OF ONCOLOGIC HISTORY: Oncology History  Multiple myeloma in relapse (HCC)  06/23/2015 - 06/28/2015 Hospital Admission   The patient was admitted to the hospital due to gait ataxia and back pain. He was subsequently found to have cord compression underwent surgery and was discharged home   06/24/2015 Imaging   Abnormal appearance of the T6 vertebral body, highly suspicious for possible osseous metastasis. Associated pathologic fracture withup to 30% height loss. There is associated abnormal soft tissue density within the ventral epidural space,   06/24/2015 Imaging   MRI lumbar: Focal osseous lesion with abnormal enhancement involving the right pedicle of L3, suspicious for possible osseous metastasisgiven the findings in the thoracic spine. Question additional focal lesion within the right iliac wing as above.     06/25/2015 Pathology Results   Accession: [id number] bone biopsy come from plasma cell neoplasm.   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 Zometa is not given due to inability to get  dental clearance   12/14/2015 - 04/24/2016 Chemotherapy   He is started on maintenance treatment with Revlimid only   12/18/2015 Imaging   MRI thoracic and lumbar spine showed numerous enhancing foci throughout the thoracic and lumbar spine with several new small foci in the lumbar spine in comparison with prior MRI compatible with metastatic disease. Stable loss of height of the T3, T4, and T6 vertebral bodies and new postsurgical changes related to T6 laminectomy. No significant epidural disease or evidence for cord compression. No abnormal enhancement of the spinal cord or cauda equina.   05/02/2016 Bone Marrow Biopsy   Outside bone marrow biopsy showed 20% myeloma involvement   05/16/2016 Procedure   Successful placement of a right internal jugular approach power injectable Port-A-Cath. The catheter is ready for immediate use.   05/21/2016 - 07/03/2016 Chemotherapy   He received Kyprolis, Cytoxan and dexamethasone    07/08/2016 Procedure   Status post CT-guided bone marrow biopsy, with tissue specimen sent to pathology for complete histopathologic analysis   07/08/2016 Bone Marrow Biopsy   Bone  Marrow, Aspirate,Biopsy, and Clot BONE MARROW: - MILDLY HYPERCELLULAR MARROW (60%) WITH PLASMA CELL NEOPLASM - SEE COMMENT PERIPHERAL BLOOD: - NORMOCYTIC ANEMIA Diagnosis Note The marrow is hypercellular with lambda-restricted plasma cells consistent with persistence of the patient's previously diagnosed plasma cell neoplasm. The plasma cells comprise approximately 10-15% of the total marrow cellularity, are enlarged, and arranged in clusters.   08/14/2016 Miscellaneous   He received conditioning treatment with melphalan   08/15/2016 Bone Marrow Transplant   He received autologous stem cell transplant   08/24/2016 - 08/29/2016 Hospital Admission   His post-transplant course was complicated by E-Coli bacteremia   11/26/2016 PET scan   PET CT at [facility] 1. Technically limited study due to soft  tissue uptake. 2. New hypermetabolic uptake at C7 spinous process and left proximal femur that is of questionable significance in absence of underlying CT correlate. Further assessment with whole body bone scan may be considered. 2. Redemonstrated nonhypermetabolic multifocal lucent and sclerotic lesions throughout the spine which are similar to prior.    12/02/2016 Bone Marrow Biopsy   He had repeat [facility] An immunohistochemical stain for CD138 is performed on the bone marrow core biopsy demonstrates increased plasma cells with focal clustering (10-20% overall), which are monotypic for lambda light chain by in situ hybridization.   01/06/2017 - 06/09/2017 Chemotherapy   He received weekly Dexamethasone, Pomalyst days 1-21 and Daratumumab. From 06/08/17 onwards, he is placed on maintenance Pomalyst only.  He self discontinue Pomalyst in February 2021   07/23/2017 Procedure   Successful right IJ vein Port-A-Cath explant.   03/22/2021 Imaging   1. New large disc protrusion at L4-5 with severe spinal stenosis. 2. History of multiple myeloma with largely resolved diffuse bone marrow heterogeneity since 2017. New enhancing lesions in the L4 and L5 vertebral bodies without acute fracture. 3. Chronic thoracic compression fractures including severe T6 vertebral body height loss which has progressed from 2017. Resolved epidural tumor.     11/26/2021 PET scan   1. Hypermetabolic patchy lytic L5 vertebral lesion compatible with metabolically active multiple myeloma. 2. No additional sites of hypermetabolic skeletal or extraskeletal myeloma. 3. Mild colonic diverticulosis.   11/27/2021 - 11/27/2021 Chemotherapy   Patient is on Treatment Plan : MYELOMA Daratumumab IV + Pomalidomide + Dexamethasone q28d x 7 cycles     11/27/2021 - 05/07/2022 Chemotherapy   Patient is on Treatment Plan : MYELOMA RELAPSED REFRACTORY Daratumumab SQ + Pomalidomide + Dexamethasone (DaraPd) q28d      05/20/2022 -  Chemotherapy   Patient is on Treatment Plan : MYELOMA  RVD SQ q21d x 4 cycles       PHYSICAL EXAMINATION: ECOG PERFORMANCE STATUS: 0 - Asymptomatic  Vitals:   09/02/22 0954  BP: (!) 146/91  Pulse: 75  Resp: 18  Temp: (!) 97.5 F (36.4 C)  SpO2: 100%   Filed Weights   09/02/22 0954  Weight: 251 lb 9.6 oz (114.1 kg)    GENERAL:alert, no distress and comfortable NEURO: alert & oriented x 3 with fluent speech, no focal motor/sensory deficits  LABORATORY DATA:  I have reviewed the data as listed    Component Value Date/Time   NA 139 09/02/2022 0938   NA 139 01/27/2017 0809   K 3.7 09/02/2022 0938   K 4.1 01/27/2017 0809   CL 106 09/02/2022 0938   CO2 27 09/02/2022 0938   CO2 23 01/27/2017 0809   GLUCOSE 109 (H) 09/02/2022 0938   GLUCOSE 122 01/27/2017 0809   BUN  17 09/02/2022 0938   BUN 14.3 01/27/2017 0809   CREATININE 0.98 09/02/2022 0938   CREATININE 0.9 01/27/2017 0809   CALCIUM 9.0 09/02/2022 0938   CALCIUM 9.1 01/27/2017 0809   PROT 6.1 (L) 09/02/2022 0938   PROT 6.2 (L) 01/27/2017 0809   ALBUMIN 4.0 09/02/2022 0938   ALBUMIN 3.7 01/27/2017 0809   AST 13 (L) 09/02/2022 0938   AST 10 01/27/2017 0809   ALT 18 09/02/2022 0938   ALT 18 01/27/2017 0809   ALKPHOS 42 09/02/2022 0938   ALKPHOS 42 01/27/2017 0809   BILITOT 0.5 09/02/2022 0938   BILITOT 0.72 01/27/2017 0809   GFRNONAA >60 09/02/2022 0938   GFRAA >60 10/18/2019 1017    No results found for: "SPEP", "UPEP"  Lab Results  Component Value Date   WBC 3.7 (L) 09/02/2022   NEUTROABS 1.6 (L) 09/02/2022   HGB 12.5 (L) 09/02/2022   HCT 35.9 (L) 09/02/2022   MCV 91.8 09/02/2022   PLT 167 09/02/2022      Chemistry      Component Value Date/Time   NA 139 09/02/2022 0938   NA 139 01/27/2017 0809   K 3.7 09/02/2022 0938   K 4.1 01/27/2017 0809   CL 106 09/02/2022 0938   CO2 27 09/02/2022 0938   CO2 23 01/27/2017 0809   BUN 17 09/02/2022 0938   BUN 14.3 01/27/2017 0809    CREATININE 0.98 09/02/2022 0938   CREATININE 0.9 01/27/2017 0809      Component Value Date/Time   CALCIUM 9.0 09/02/2022 0938   CALCIUM 9.1 01/27/2017 0809   ALKPHOS 42 09/02/2022 0938   ALKPHOS 42 01/27/2017 0809   AST 13 (L) 09/02/2022 0938   AST 10 01/27/2017 0809   ALT 18 09/02/2022 0938   ALT 18 01/27/2017 0809   BILITOT 0.5 09/02/2022 0938   BILITOT 0.72 01/27/2017 0809

## 2022-09-02 NOTE — Patient Instructions (Signed)
[facility]  Discharge Instructions: Thank you for choosing [facility] to provide your oncology and hematology care.   If you have a lab appointment with the Cancer Center, please go directly to the Cancer Center and check in at the registration area.   Wear comfortable clothing and clothing appropriate for easy access to any Portacath or PICC line.   We strive to give you quality time with your provider. You may need to reschedule your appointment if you arrive late (15 or more minutes).  Arriving late affects you and other patients whose appointments are after yours.  Also, if you miss three or more appointments without notifying the office, you may be dismissed from the clinic at the provider's discretion.      For prescription refill requests, have your pharmacy contact our office and allow 72 hours for refills to be completed.    Today you received the following chemotherapy and/or immunotherapy agents: Velcade        To help prevent nausea and vomiting after your treatment, we encourage you to take your nausea medication as directed.  BELOW ARE SYMPTOMS THAT SHOULD BE REPORTED IMMEDIATELY: *FEVER GREATER THAN 100.4 F (38 C) OR HIGHER *CHILLS OR SWEATING *NAUSEA AND VOMITING THAT IS NOT CONTROLLED WITH YOUR NAUSEA MEDICATION *UNUSUAL SHORTNESS OF BREATH *UNUSUAL BRUISING OR BLEEDING *URINARY PROBLEMS (pain or burning when urinating, or frequent urination) *BOWEL PROBLEMS (unusual diarrhea, constipation, pain near the anus) TENDERNESS IN MOUTH AND THROAT WITH OR WITHOUT PRESENCE OF ULCERS (sore throat, sores in mouth, or a toothache) UNUSUAL RASH, SWELLING OR PAIN  UNUSUAL VAGINAL DISCHARGE OR ITCHING   Items with * indicate a potential emergency and should be followed up as soon as possible or go to the Emergency Department if any problems should occur.  Please show the CHEMOTHERAPY ALERT CARD or IMMUNOTHERAPY ALERT CARD at  check-in to the Emergency Department and triage nurse.  Should you have questions after your visit or need to cancel or reschedule your appointment, please contact [facility]  Dept: [phone]  and follow the prompts.  Office hours are 8:00 a.m. to 4:30 p.m. Monday - Friday. Please note that voicemails left after 4:00 p.m. may not be returned until the following business day.  We are closed weekends and major holidays. You have access to a nurse at all times for urgent questions. Please call the main number to the clinic Dept: [phone] and follow the prompts.   For any non-urgent questions, you may also contact your provider using MyChart. We now offer e-Visits for anyone 18 and older to request care online for non-urgent symptoms. For details visit [email or web address].   Also download the MyChart app! Go to the app store, search "MyChart", open the app, select [facility], and log in with your MyChart username and password.  

## 2022-09-04 ENCOUNTER — Other Ambulatory Visit: Payer: Self-pay

## 2022-09-09 ENCOUNTER — Inpatient Hospital Stay: Payer: No Typology Code available for payment source

## 2022-09-09 ENCOUNTER — Other Ambulatory Visit: Payer: Self-pay

## 2022-09-09 VITALS — BP 144/82 | HR 72 | Temp 98.2°F | Resp 18 | Wt 254.8 lb

## 2022-09-09 DIAGNOSIS — C9002 Multiple myeloma in relapse: Secondary | ICD-10-CM

## 2022-09-09 DIAGNOSIS — Z5112 Encounter for antineoplastic immunotherapy: Secondary | ICD-10-CM | POA: Diagnosis not present

## 2022-09-09 LAB — CBC WITH DIFFERENTIAL (CANCER CENTER ONLY)
Abs Immature Granulocytes: 0.01 10*3/uL (ref 0.00–0.07)
Basophils Absolute: 0.1 10*3/uL (ref 0.0–0.1)
Basophils Relative: 2 %
Eosinophils Absolute: 0.2 10*3/uL (ref 0.0–0.5)
Eosinophils Relative: 4 %
HCT: 37.1 % — ABNORMAL LOW (ref 39.0–52.0)
Hemoglobin: 12.8 g/dL — ABNORMAL LOW (ref 13.0–17.0)
Immature Granulocytes: 0 %
Lymphocytes Relative: 31 %
Lymphs Abs: 1.4 10*3/uL (ref 0.7–4.0)
MCH: 31.5 pg (ref 26.0–34.0)
MCHC: 34.5 g/dL (ref 30.0–36.0)
MCV: 91.4 fL (ref 80.0–100.0)
Monocytes Absolute: 0.6 10*3/uL (ref 0.1–1.0)
Monocytes Relative: 13 %
Neutro Abs: 2.3 10*3/uL (ref 1.7–7.7)
Neutrophils Relative %: 50 %
Platelet Count: 188 10*3/uL (ref 150–400)
RBC: 4.06 MIL/uL — ABNORMAL LOW (ref 4.22–5.81)
RDW: 13.7 % (ref 11.5–15.5)
WBC Count: 4.7 10*3/uL (ref 4.0–10.5)
nRBC: 0 % (ref 0.0–0.2)

## 2022-09-09 LAB — CMP (CANCER CENTER ONLY)
ALT: 19 U/L (ref 0–44)
AST: 14 U/L — ABNORMAL LOW (ref 15–41)
Albumin: 4.2 g/dL (ref 3.5–5.0)
Alkaline Phosphatase: 37 U/L — ABNORMAL LOW (ref 38–126)
Anion gap: 6 (ref 5–15)
BUN: 21 mg/dL — ABNORMAL HIGH (ref 6–20)
CO2: 27 mmol/L (ref 22–32)
Calcium: 9.1 mg/dL (ref 8.9–10.3)
Chloride: 105 mmol/L (ref 98–111)
Creatinine: 1.1 mg/dL (ref 0.61–1.24)
GFR, Estimated: >60 mL/min (ref >60)
Glucose, Bld: 89 mg/dL (ref 70–99)
Potassium: 4 mmol/L (ref 3.5–5.1)
Sodium: 138 mmol/L (ref 135–145)
Total Bilirubin: 0.7 mg/dL (ref 0.3–1.2)
Total Protein: 6.4 g/dL — ABNORMAL LOW (ref 6.5–8.1)

## 2022-09-09 MED ORDER — BORTEZOMIB CHEMO SQ INJECTION 3.5 MG (2.5MG/ML)
0.9750 mg/m2 | Freq: Once | INTRAMUSCULAR | Status: AC
  Administered 2022-09-09: 2.5 mg via SUBCUTANEOUS
  Filled 2022-09-09: qty 1

## 2022-09-09 NOTE — Patient Instructions (Signed)
[facility]  Discharge Instructions: Thank you for choosing [facility] to provide your oncology and hematology care.   If you have a lab appointment with the Cancer Center, please go directly to the Cancer Center and check in at the registration area.   Wear comfortable clothing and clothing appropriate for easy access to any Portacath or PICC line.   We strive to give you quality time with your provider. You may need to reschedule your appointment if you arrive late (15 or more minutes).  Arriving late affects you and other patients whose appointments are after yours.  Also, if you miss three or more appointments without notifying the office, you may be dismissed from the clinic at the provider's discretion.      For prescription refill requests, have your pharmacy contact our office and allow 72 hours for refills to be completed.    Today you received the following chemotherapy and/or immunotherapy agents: Velcade        To help prevent nausea and vomiting after your treatment, we encourage you to take your nausea medication as directed.  BELOW ARE SYMPTOMS THAT SHOULD BE REPORTED IMMEDIATELY: *FEVER GREATER THAN 100.4 F (38 C) OR HIGHER *CHILLS OR SWEATING *NAUSEA AND VOMITING THAT IS NOT CONTROLLED WITH YOUR NAUSEA MEDICATION *UNUSUAL SHORTNESS OF BREATH *UNUSUAL BRUISING OR BLEEDING *URINARY PROBLEMS (pain or burning when urinating, or frequent urination) *BOWEL PROBLEMS (unusual diarrhea, constipation, pain near the anus) TENDERNESS IN MOUTH AND THROAT WITH OR WITHOUT PRESENCE OF ULCERS (sore throat, sores in mouth, or a toothache) UNUSUAL RASH, SWELLING OR PAIN  UNUSUAL VAGINAL DISCHARGE OR ITCHING   Items with * indicate a potential emergency and should be followed up as soon as possible or go to the Emergency Department if any problems should occur.  Please show the CHEMOTHERAPY ALERT CARD or IMMUNOTHERAPY ALERT CARD at  check-in to the Emergency Department and triage nurse.  Should you have questions after your visit or need to cancel or reschedule your appointment, please contact [facility]  Dept: [phone]  and follow the prompts.  Office hours are 8:00 a.m. to 4:30 p.m. Monday - Friday. Please note that voicemails left after 4:00 p.m. may not be returned until the following business day.  We are closed weekends and major holidays. You have access to a nurse at all times for urgent questions. Please call the main number to the clinic Dept: [phone] and follow the prompts.   For any non-urgent questions, you may also contact your provider using MyChart. We now offer e-Visits for anyone 18 and older to request care online for non-urgent symptoms. For details visit [email or web address].   Also download the MyChart app! Go to the app store, search "MyChart", open the app, select [facility], and log in with your MyChart username and password.  

## 2022-09-16 ENCOUNTER — Inpatient Hospital Stay: Payer: No Typology Code available for payment source

## 2022-09-16 VITALS — BP 144/86 | HR 79 | Temp 98.2°F | Resp 18 | Wt 253.2 lb

## 2022-09-16 DIAGNOSIS — Z5112 Encounter for antineoplastic immunotherapy: Secondary | ICD-10-CM | POA: Diagnosis not present

## 2022-09-16 DIAGNOSIS — C9002 Multiple myeloma in relapse: Secondary | ICD-10-CM

## 2022-09-16 LAB — CBC WITH DIFFERENTIAL (CANCER CENTER ONLY)
Abs Immature Granulocytes: 0.01 10*3/uL (ref 0.00–0.07)
Basophils Absolute: 0 10*3/uL (ref 0.0–0.1)
Basophils Relative: 1 %
Eosinophils Absolute: 0.2 10*3/uL (ref 0.0–0.5)
Eosinophils Relative: 5 %
HCT: 38.2 % — ABNORMAL LOW (ref 39.0–52.0)
Hemoglobin: 13.5 g/dL (ref 13.0–17.0)
Immature Granulocytes: 0 %
Lymphocytes Relative: 38 %
Lymphs Abs: 1.3 10*3/uL (ref 0.7–4.0)
MCH: 32.3 pg (ref 26.0–34.0)
MCHC: 35.3 g/dL (ref 30.0–36.0)
MCV: 91.4 fL (ref 80.0–100.0)
Monocytes Absolute: 0.3 10*3/uL (ref 0.1–1.0)
Monocytes Relative: 8 %
Neutro Abs: 1.6 10*3/uL — ABNORMAL LOW (ref 1.7–7.7)
Neutrophils Relative %: 48 %
Platelet Count: 144 10*3/uL — ABNORMAL LOW (ref 150–400)
RBC: 4.18 MIL/uL — ABNORMAL LOW (ref 4.22–5.81)
RDW: 13.3 % (ref 11.5–15.5)
WBC Count: 3.4 10*3/uL — ABNORMAL LOW (ref 4.0–10.5)
nRBC: 0 % (ref 0.0–0.2)

## 2022-09-16 LAB — CMP (CANCER CENTER ONLY)
ALT: 20 U/L (ref 0–44)
AST: 15 U/L (ref 15–41)
Albumin: 4.2 g/dL (ref 3.5–5.0)
Alkaline Phosphatase: 39 U/L (ref 38–126)
Anion gap: 5 (ref 5–15)
BUN: 16 mg/dL (ref 6–20)
CO2: 29 mmol/L (ref 22–32)
Calcium: 9.4 mg/dL (ref 8.9–10.3)
Chloride: 104 mmol/L (ref 98–111)
Creatinine: 1.03 mg/dL (ref 0.61–1.24)
GFR, Estimated: >60 mL/min (ref >60)
Glucose, Bld: 116 mg/dL — ABNORMAL HIGH (ref 70–99)
Potassium: 4 mmol/L (ref 3.5–5.1)
Sodium: 138 mmol/L (ref 135–145)
Total Bilirubin: 0.8 mg/dL (ref 0.3–1.2)
Total Protein: 6.6 g/dL (ref 6.5–8.1)

## 2022-09-16 MED ORDER — BORTEZOMIB CHEMO SQ INJECTION 3.5 MG (2.5MG/ML)
0.9750 mg/m2 | Freq: Once | INTRAMUSCULAR | Status: AC
  Administered 2022-09-16: 2.5 mg via SUBCUTANEOUS
  Filled 2022-09-16: qty 1

## 2022-09-16 NOTE — Patient Instructions (Signed)
[facility]  Discharge Instructions: Thank you for choosing [facility] to provide your oncology and hematology care.   If you have a lab appointment with the Cancer Center, please go directly to the Cancer Center and check in at the registration area.   Wear comfortable clothing and clothing appropriate for easy access to any Portacath or PICC line.   We strive to give you quality time with your provider. You may need to reschedule your appointment if you arrive late (15 or more minutes).  Arriving late affects you and other patients whose appointments are after yours.  Also, if you miss three or more appointments without notifying the office, you may be dismissed from the clinic at the provider's discretion.      For prescription refill requests, have your pharmacy contact our office and allow 72 hours for refills to be completed.    Today you received the following chemotherapy and/or immunotherapy agents: Velcade        To help prevent nausea and vomiting after your treatment, we encourage you to take your nausea medication as directed.  BELOW ARE SYMPTOMS THAT SHOULD BE REPORTED IMMEDIATELY: *FEVER GREATER THAN 100.4 F (38 C) OR HIGHER *CHILLS OR SWEATING *NAUSEA AND VOMITING THAT IS NOT CONTROLLED WITH YOUR NAUSEA MEDICATION *UNUSUAL SHORTNESS OF BREATH *UNUSUAL BRUISING OR BLEEDING *URINARY PROBLEMS (pain or burning when urinating, or frequent urination) *BOWEL PROBLEMS (unusual diarrhea, constipation, pain near the anus) TENDERNESS IN MOUTH AND THROAT WITH OR WITHOUT PRESENCE OF ULCERS (sore throat, sores in mouth, or a toothache) UNUSUAL RASH, SWELLING OR PAIN  UNUSUAL VAGINAL DISCHARGE OR ITCHING   Items with * indicate a potential emergency and should be followed up as soon as possible or go to the Emergency Department if any problems should occur.  Please show the CHEMOTHERAPY ALERT CARD or IMMUNOTHERAPY ALERT CARD at  check-in to the Emergency Department and triage nurse.  Should you have questions after your visit or need to cancel or reschedule your appointment, please contact [facility]  Dept: [phone]  and follow the prompts.  Office hours are 8:00 a.m. to 4:30 p.m. Monday - Friday. Please note that voicemails left after 4:00 p.m. may not be returned until the following business day.  We are closed weekends and major holidays. You have access to a nurse at all times for urgent questions. Please call the main number to the clinic Dept: [phone] and follow the prompts.   For any non-urgent questions, you may also contact your provider using MyChart. We now offer e-Visits for anyone 18 and older to request care online for non-urgent symptoms. For details visit [email or web address].   Also download the MyChart app! Go to the app store, search "MyChart", open the app, select [facility], and log in with your MyChart username and password.  

## 2022-09-20 DIAGNOSIS — Z0000 Encounter for general adult medical examination without abnormal findings: Secondary | ICD-10-CM | POA: Diagnosis not present

## 2022-09-24 ENCOUNTER — Inpatient Hospital Stay: Payer: No Typology Code available for payment source

## 2022-09-24 ENCOUNTER — Encounter: Payer: Self-pay | Admitting: Hematology and Oncology

## 2022-09-24 ENCOUNTER — Inpatient Hospital Stay (HOSPITAL_BASED_OUTPATIENT_CLINIC_OR_DEPARTMENT_OTHER): Payer: No Typology Code available for payment source | Admitting: Hematology and Oncology

## 2022-09-24 VITALS — BP 166/98 | HR 76 | Temp 97.8°F | Resp 18 | Ht 74.0 in | Wt 254.8 lb

## 2022-09-24 VITALS — BP 143/94 | HR 72

## 2022-09-24 DIAGNOSIS — D61818 Other pancytopenia: Secondary | ICD-10-CM

## 2022-09-24 DIAGNOSIS — Z5112 Encounter for antineoplastic immunotherapy: Secondary | ICD-10-CM | POA: Diagnosis not present

## 2022-09-24 DIAGNOSIS — C9002 Multiple myeloma in relapse: Secondary | ICD-10-CM

## 2022-09-24 LAB — CBC WITH DIFFERENTIAL (CANCER CENTER ONLY)
Abs Immature Granulocytes: 0 10*3/uL (ref 0.00–0.07)
Basophils Absolute: 0.1 10*3/uL (ref 0.0–0.1)
Basophils Relative: 1 %
Eosinophils Absolute: 0.3 10*3/uL (ref 0.0–0.5)
Eosinophils Relative: 8 %
HCT: 36.7 % — ABNORMAL LOW (ref 39.0–52.0)
Hemoglobin: 13.1 g/dL (ref 13.0–17.0)
Immature Granulocytes: 0 %
Lymphocytes Relative: 34 %
Lymphs Abs: 1.2 10*3/uL (ref 0.7–4.0)
MCH: 32.5 pg (ref 26.0–34.0)
MCHC: 35.7 g/dL (ref 30.0–36.0)
MCV: 91.1 fL (ref 80.0–100.0)
Monocytes Absolute: 0.6 10*3/uL (ref 0.1–1.0)
Monocytes Relative: 18 %
Neutro Abs: 1.4 10*3/uL — ABNORMAL LOW (ref 1.7–7.7)
Neutrophils Relative %: 39 %
Platelet Count: 128 10*3/uL — ABNORMAL LOW (ref 150–400)
RBC: 4.03 MIL/uL — ABNORMAL LOW (ref 4.22–5.81)
RDW: 13.5 % (ref 11.5–15.5)
WBC Count: 3.6 10*3/uL — ABNORMAL LOW (ref 4.0–10.5)
nRBC: 0 % (ref 0.0–0.2)

## 2022-09-24 LAB — CMP (CANCER CENTER ONLY)
ALT: 21 U/L (ref 0–44)
AST: 15 U/L (ref 15–41)
Albumin: 4.3 g/dL (ref 3.5–5.0)
Alkaline Phosphatase: 37 U/L — ABNORMAL LOW (ref 38–126)
Anion gap: 5 (ref 5–15)
BUN: 14 mg/dL (ref 6–20)
CO2: 29 mmol/L (ref 22–32)
Calcium: 9.5 mg/dL (ref 8.9–10.3)
Chloride: 104 mmol/L (ref 98–111)
Creatinine: 1.08 mg/dL (ref 0.61–1.24)
GFR, Estimated: >60 mL/min (ref >60)
Glucose, Bld: 102 mg/dL — ABNORMAL HIGH (ref 70–99)
Potassium: 4.1 mmol/L (ref 3.5–5.1)
Sodium: 138 mmol/L (ref 135–145)
Total Bilirubin: 0.9 mg/dL (ref 0.3–1.2)
Total Protein: 6.6 g/dL (ref 6.5–8.1)

## 2022-09-24 MED ORDER — BORTEZOMIB CHEMO SQ INJECTION 3.5 MG (2.5MG/ML)
0.9750 mg/m2 | Freq: Once | INTRAMUSCULAR | Status: AC
  Administered 2022-09-24: 2.5 mg via SUBCUTANEOUS
  Filled 2022-09-24: qty 1

## 2022-09-24 NOTE — Progress Notes (Signed)
Ok to proceed with treatment with anc of 1.4 today per Dr. Bertis Ruddy

## 2022-09-24 NOTE — Progress Notes (Signed)
[facility] OFFICE PROGRESS NOTE  Patient Care Team: Rometta [name], MD as PCP - General (Internal Medicine)  ASSESSMENT & PLAN:  Multiple myeloma in relapse [facility]) I have reviewed his recent myeloma panel which show excellent response to therapy We will continue treatment as scheduled Once his light chain levels are lower at under 100, I will then transition him to maintenance treatment I will start dexamethasone taper He is not able to get dental clearance and hence he is not receiving Zometa  Pancytopenia, acquired [facility]) He has intermittent mild pancytopenia due to treatment We will proceed with treatment without delay  Orders Placed This Encounter  Procedures   CBC with Differential (Cancer Center Only)    Standing Status:   Future    Standing Expiration Date:   10/08/2023   CMP (Cancer Center only)    Standing Status:   Future    Standing Expiration Date:   10/08/2023   CBC with Differential (Cancer Center Only)    Standing Status:   Future    Standing Expiration Date:   10/15/2023   CMP (Cancer Center only)    Standing Status:   Future    Standing Expiration Date:   10/15/2023   Multiple Myeloma Panel (SPEP&IFE w/QIG)    Standing Status:   Future    Standing Expiration Date:   10/22/2023   Kappa/lambda light chains    Standing Status:   Future    Standing Expiration Date:   10/22/2023   CBC with Differential (Cancer Center Only)    Standing Status:   Future    Standing Expiration Date:   10/22/2023   CMP (Cancer Center only)    Standing Status:   Future    Standing Expiration Date:   10/22/2023    All questions were answered. The patient knows to call the clinic with any problems, questions or concerns. The total time spent in the appointment was 25 minutes encounter with patients including review of chart and various tests results, discussions about plan of care and coordination of care plan   Artis Delay, MD 09/24/2022 1:35 PM  INTERVAL  HISTORY: Please see below for problem oriented charting. he returns for treatment follow-up He tolerated recent treatment well Denies peripheral neuropathy His documented blood pressure at home were within normal range We reviewed results of recent myeloma panel  REVIEW OF SYSTEMS:   Constitutional: Denies fevers, chills or abnormal weight loss Eyes: Denies blurriness of vision Ears, nose, mouth, throat, and face: Denies mucositis or sore throat Respiratory: Denies cough, dyspnea or wheezes Cardiovascular: Denies palpitation, chest discomfort or lower extremity swelling Gastrointestinal:  Denies nausea, heartburn or change in bowel habits Skin: Denies abnormal skin rashes Lymphatics: Denies new lymphadenopathy or easy bruising Neurological:Denies numbness, tingling or new weaknesses Behavioral/Psych: Mood is stable, no new changes  All other systems were reviewed with the patient and are negative.  I have reviewed the past medical history, past surgical history, social history and family history with the patient and they are unchanged from previous note.  ALLERGIES:  is allergic to daratumumab and heparin.  MEDICATIONS:  Current Outpatient Medications  Medication Sig Dispense Refill   acyclovir (ZOVIRAX) 400 MG tablet Take 1 tablet (400 mg total) by mouth 2 (two) times daily. 60 tablet 11   amoxicillin (AMOXIL) 500 MG capsule Take 1 capsule (500 mg total) by mouth 2 (two) times daily. 14 capsule 0   aspirin EC 81 MG tablet Take 81 mg by mouth daily. Swallow  whole.     calcium carbonate (TUMS - DOSED IN MG ELEMENTAL CALCIUM) 500 MG chewable tablet Chew 1 tablet by mouth 3 (three) times daily.     cholecalciferol (VITAMIN D3) 25 MCG (1000 UT) tablet Take 1,000 Units by mouth daily.     dexamethasone (DECADRON) 4 MG tablet Take 3 tablets (12 mg total) by mouth once a week. Take 3 tablets weekly on Monday mornings with food 20 tablet 11   lenalidomide (REVLIMID) 25 MG capsule Take 1  capsule daily for 21 days, then stop 7 days, for cycle of every 28 days 21 capsule 0   ondansetron (ZOFRAN) 8 MG tablet Take 1 tablet (8 mg total) by mouth every 8 (eight) hours as needed for nausea or vomiting. 30 tablet 1   pantoprazole (PROTONIX) 40 MG tablet Take 1 tablet (40 mg total) by mouth daily. 60 tablet 1   prochlorperazine (COMPAZINE) 10 MG tablet Take 1 tablet (10 mg total) by mouth every 6 (six) hours as needed for nausea or vomiting. 30 tablet 1   No current facility-administered medications for this visit.    SUMMARY OF ONCOLOGIC HISTORY: Oncology History  Multiple myeloma in relapse [facility])  06/23/2015 - 06/28/2015 Hospital Admission   The patient was admitted to the hospital due to gait ataxia and back pain. He was subsequently found to have cord compression underwent surgery and was discharged home   06/24/2015 Imaging   Abnormal appearance of the T6 vertebral body, highly suspicious for possible osseous metastasis. Associated pathologic fracture withup to 30% height loss. There is associated abnormal soft tissue density within the ventral epidural space,   06/24/2015 Imaging   MRI lumbar: Focal osseous lesion with abnormal enhancement involving the right pedicle of L3, suspicious for possible osseous metastasisgiven the findings in the thoracic spine. Question additional focal lesion within the right iliac wing as above.     06/25/2015 Pathology Results   Accession: [id number] bone biopsy come from plasma cell neoplasm.   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 Zometa is not given due to inability to get dental clearance   12/14/2015 - 04/24/2016 Chemotherapy   He is started on  maintenance treatment with Revlimid only   12/18/2015 Imaging   MRI thoracic and lumbar spine showed numerous enhancing foci throughout the thoracic and lumbar spine with several new small foci in the lumbar spine in comparison with prior MRI compatible with metastatic disease. Stable loss of height of the T3, T4, and T6 vertebral bodies and new postsurgical changes related to T6 laminectomy. No significant epidural disease or evidence for cord compression. No abnormal enhancement of the spinal cord or cauda equina.   05/02/2016 Bone Marrow Biopsy   Outside bone marrow biopsy showed 20% myeloma involvement   05/16/2016 Procedure   Successful placement of a right internal jugular approach power injectable Port-A-Cath. The catheter is ready for immediate use.   05/21/2016 - 07/03/2016 Chemotherapy   He received Kyprolis, Cytoxan and dexamethasone    07/08/2016 Procedure   Status post CT-guided bone marrow biopsy, with tissue specimen sent to pathology for complete histopathologic analysis   07/08/2016 Bone Marrow Biopsy   Bone Marrow, Aspirate,Biopsy, and Clot BONE MARROW: - MILDLY HYPERCELLULAR MARROW (60%) WITH PLASMA  CELL NEOPLASM - SEE COMMENT PERIPHERAL BLOOD: - NORMOCYTIC ANEMIA Diagnosis Note The marrow is hypercellular with lambda-restricted plasma cells consistent with persistence of the patient's previously diagnosed plasma cell neoplasm. The plasma cells comprise approximately 10-15% of the total marrow cellularity, are enlarged, and arranged in clusters.   08/14/2016 Miscellaneous   He received conditioning treatment with melphalan   08/15/2016 Bone Marrow Transplant   He received autologous stem cell transplant   08/24/2016 - 08/29/2016 Hospital Admission   His post-transplant course was complicated by E-Coli bacteremia   11/26/2016 PET scan   PET CT at [facility] 1. Technically limited study due to soft tissue uptake. 2. New hypermetabolic uptake at C7 spinous process and left  proximal femur that is of questionable significance in absence of underlying CT correlate. Further assessment with whole body bone scan may be considered. 2. Redemonstrated nonhypermetabolic multifocal lucent and sclerotic lesions throughout the spine which are similar to prior.    12/02/2016 Bone Marrow Biopsy   He had repeat bone marrow biopsy at [facility] An immunohistochemical stain for CD138 is performed on the bone marrow core biopsy demonstrates increased plasma cells with focal clustering (10-20% overall), which are monotypic for lambda light chain by in situ hybridization.   01/06/2017 - 06/09/2017 Chemotherapy   He received weekly Dexamethasone, Pomalyst days 1-21 and Daratumumab. From 06/08/17 onwards, he is placed on maintenance Pomalyst only.  He self discontinue Pomalyst in February 2021   07/23/2017 Procedure   Successful right IJ vein Port-A-Cath explant.   03/22/2021 Imaging   1. New large disc protrusion at L4-5 with severe spinal stenosis. 2. History of multiple myeloma with largely resolved diffuse bone marrow heterogeneity since 2017. New enhancing lesions in the L4 and L5 vertebral bodies without acute fracture. 3. Chronic thoracic compression fractures including severe T6 vertebral body height loss which has progressed from 2017. Resolved epidural tumor.     11/26/2021 PET scan   1. Hypermetabolic patchy lytic L5 vertebral lesion compatible with metabolically active multiple myeloma. 2. No additional sites of hypermetabolic skeletal or extraskeletal myeloma. 3. Mild colonic diverticulosis.   11/27/2021 - 11/27/2021 Chemotherapy   Patient is on Treatment Plan : MYELOMA Daratumumab IV + Pomalidomide + Dexamethasone q28d x 7 cycles     11/27/2021 - 05/07/2022 Chemotherapy   Patient is on Treatment Plan : MYELOMA RELAPSED REFRACTORY Daratumumab SQ + Pomalidomide + Dexamethasone (DaraPd) q28d     05/20/2022 -  Chemotherapy   Patient is on Treatment Plan : MYELOMA  RVD SQ q21d x 4  cycles       PHYSICAL EXAMINATION: ECOG PERFORMANCE STATUS: 0 - Asymptomatic  Vitals:   09/24/22 1000  BP: (!) 166/98  Pulse: 76  Resp: 18  Temp: 97.8 F (36.6 C)  SpO2: 100%   Filed Weights   09/24/22 1000  Weight: 254 lb 12.8 oz (115.6 kg)    GENERAL:alert, no distress and comfortable NEURO: alert & oriented x 3 with fluent speech, no focal motor/sensory deficits  LABORATORY DATA:  I have reviewed the data as listed    Component Value Date/Time   NA 138 09/24/2022 0938   NA 139 01/27/2017 0809   K 4.1 09/24/2022 0938   K 4.1 01/27/2017 0809   CL 104 09/24/2022 0938   CO2 29 09/24/2022 0938   CO2 23 01/27/2017 0809   GLUCOSE 102 (H) 09/24/2022 0938   GLUCOSE 122 01/27/2017 0809   BUN 14 09/24/2022 0938   BUN 14.3 01/27/2017 0809   CREATININE 1.08 09/24/2022  6387   CREATININE 0.9 01/27/2017 0809   CALCIUM 9.5 09/24/2022 0938   CALCIUM 9.1 01/27/2017 0809   PROT 6.6 09/24/2022 0938   PROT 6.2 (L) 01/27/2017 0809   ALBUMIN 4.3 09/24/2022 0938   ALBUMIN 3.7 01/27/2017 0809   AST 15 09/24/2022 0938   AST 10 01/27/2017 0809   ALT 21 09/24/2022 0938   ALT 18 01/27/2017 0809   ALKPHOS 37 (L) 09/24/2022 0938   ALKPHOS 42 01/27/2017 0809   BILITOT 0.9 09/24/2022 0938   BILITOT 0.72 01/27/2017 0809   GFRNONAA >60 09/24/2022 0938   GFRAA >60 10/18/2019 1017    No results found for: "SPEP", "UPEP"  Lab Results  Component Value Date   WBC 3.6 (L) 09/24/2022   NEUTROABS 1.4 (L) 09/24/2022   HGB 13.1 09/24/2022   HCT 36.7 (L) 09/24/2022   MCV 91.1 09/24/2022   PLT 128 (L) 09/24/2022      Chemistry      Component Value Date/Time   NA 138 09/24/2022 0938   NA 139 01/27/2017 0809   K 4.1 09/24/2022 0938   K 4.1 01/27/2017 0809   CL 104 09/24/2022 0938   CO2 29 09/24/2022 0938   CO2 23 01/27/2017 0809   BUN 14 09/24/2022 0938   BUN 14.3 01/27/2017 0809   CREATININE 1.08 09/24/2022 0938   CREATININE 0.9 01/27/2017 0809      Component Value Date/Time    CALCIUM 9.5 09/24/2022 0938   CALCIUM 9.1 01/27/2017 0809   ALKPHOS 37 (L) 09/24/2022 0938   ALKPHOS 42 01/27/2017 0809   AST 15 09/24/2022 0938   AST 10 01/27/2017 0809   ALT 21 09/24/2022 0938   ALT 18 01/27/2017 0809   BILITOT 0.9 09/24/2022 0938   BILITOT 0.72 01/27/2017 0809

## 2022-09-24 NOTE — Assessment & Plan Note (Signed)
I have reviewed his recent myeloma panel which show excellent response to therapy We will continue treatment as scheduled Once his light chain levels are lower at under 100, I will then transition him to maintenance treatment I will start dexamethasone taper He is not able to get dental clearance and hence he is not receiving Zometa

## 2022-09-24 NOTE — Assessment & Plan Note (Signed)
 He has intermittent mild pancytopenia due to treatment We will proceed with treatment without delay

## 2022-09-24 NOTE — Patient Instructions (Signed)
[facility]  Discharge Instructions: Thank you for choosing [facility] to provide your oncology and hematology care.   If you have a lab appointment with the Cancer Center, please go directly to the Cancer Center and check in at the registration area.   Wear comfortable clothing and clothing appropriate for easy access to any Portacath or PICC line.   We strive to give you quality time with your provider. You may need to reschedule your appointment if you arrive late (15 or more minutes).  Arriving late affects you and other patients whose appointments are after yours.  Also, if you miss three or more appointments without notifying the office, you may be dismissed from the clinic at the provider's discretion.      For prescription refill requests, have your pharmacy contact our office and allow 72 hours for refills to be completed.    Today you received the following chemotherapy and/or immunotherapy agents: Velcade        To help prevent nausea and vomiting after your treatment, we encourage you to take your nausea medication as directed.  BELOW ARE SYMPTOMS THAT SHOULD BE REPORTED IMMEDIATELY: *FEVER GREATER THAN 100.4 F (38 C) OR HIGHER *CHILLS OR SWEATING *NAUSEA AND VOMITING THAT IS NOT CONTROLLED WITH YOUR NAUSEA MEDICATION *UNUSUAL SHORTNESS OF BREATH *UNUSUAL BRUISING OR BLEEDING *URINARY PROBLEMS (pain or burning when urinating, or frequent urination) *BOWEL PROBLEMS (unusual diarrhea, constipation, pain near the anus) TENDERNESS IN MOUTH AND THROAT WITH OR WITHOUT PRESENCE OF ULCERS (sore throat, sores in mouth, or a toothache) UNUSUAL RASH, SWELLING OR PAIN  UNUSUAL VAGINAL DISCHARGE OR ITCHING   Items with * indicate a potential emergency and should be followed up as soon as possible or go to the Emergency Department if any problems should occur.  Please show the CHEMOTHERAPY ALERT CARD or IMMUNOTHERAPY ALERT CARD at  check-in to the Emergency Department and triage nurse.  Should you have questions after your visit or need to cancel or reschedule your appointment, please contact [facility]  Dept: [phone]  and follow the prompts.  Office hours are 8:00 a.m. to 4:30 p.m. Monday - Friday. Please note that voicemails left after 4:00 p.m. may not be returned until the following business day.  We are closed weekends and major holidays. You have access to a nurse at all times for urgent questions. Please call the main number to the clinic Dept: [phone] and follow the prompts.   For any non-urgent questions, you may also contact your provider using MyChart. We now offer e-Visits for anyone 18 and older to request care online for non-urgent symptoms. For details visit [email or web address].   Also download the MyChart app! Go to the app store, search "MyChart", open the app, select [facility], and log in with your MyChart username and password.  

## 2022-09-25 ENCOUNTER — Other Ambulatory Visit: Payer: Self-pay

## 2022-09-29 ENCOUNTER — Other Ambulatory Visit: Payer: Self-pay

## 2022-10-01 ENCOUNTER — Inpatient Hospital Stay: Payer: No Typology Code available for payment source

## 2022-10-01 ENCOUNTER — Other Ambulatory Visit: Payer: Self-pay

## 2022-10-01 ENCOUNTER — Inpatient Hospital Stay: Payer: No Typology Code available for payment source | Attending: Internal Medicine

## 2022-10-01 VITALS — BP 148/106 | HR 75 | Temp 97.1°F | Resp 18 | Wt 258.5 lb

## 2022-10-01 DIAGNOSIS — C9002 Multiple myeloma in relapse: Secondary | ICD-10-CM | POA: Diagnosis not present

## 2022-10-01 DIAGNOSIS — Z79899 Other long term (current) drug therapy: Secondary | ICD-10-CM | POA: Insufficient documentation

## 2022-10-01 DIAGNOSIS — Z5112 Encounter for antineoplastic immunotherapy: Secondary | ICD-10-CM | POA: Diagnosis not present

## 2022-10-01 LAB — CMP (CANCER CENTER ONLY)
ALT: 18 U/L (ref 0–44)
AST: 14 U/L — ABNORMAL LOW (ref 15–41)
Albumin: 3.9 g/dL (ref 3.5–5.0)
Alkaline Phosphatase: 39 U/L (ref 38–126)
Anion gap: 5 (ref 5–15)
BUN: 21 mg/dL — ABNORMAL HIGH (ref 6–20)
CO2: 26 mmol/L (ref 22–32)
Calcium: 8.8 mg/dL — ABNORMAL LOW (ref 8.9–10.3)
Chloride: 108 mmol/L (ref 98–111)
Creatinine: 0.99 mg/dL (ref 0.61–1.24)
GFR, Estimated: >60 mL/min (ref >60)
Glucose, Bld: 146 mg/dL — ABNORMAL HIGH (ref 70–99)
Potassium: 3.8 mmol/L (ref 3.5–5.1)
Sodium: 139 mmol/L (ref 135–145)
Total Bilirubin: 0.4 mg/dL (ref 0.3–1.2)
Total Protein: 6 g/dL — ABNORMAL LOW (ref 6.5–8.1)

## 2022-10-01 LAB — CBC WITH DIFFERENTIAL (CANCER CENTER ONLY)
Abs Immature Granulocytes: 0 10*3/uL (ref 0.00–0.07)
Basophils Absolute: 0 10*3/uL (ref 0.0–0.1)
Basophils Relative: 1 %
Eosinophils Absolute: 0.1 10*3/uL (ref 0.0–0.5)
Eosinophils Relative: 4 %
HCT: 35.9 % — ABNORMAL LOW (ref 39.0–52.0)
Hemoglobin: 12.2 g/dL — ABNORMAL LOW (ref 13.0–17.0)
Immature Granulocytes: 0 %
Lymphocytes Relative: 38 %
Lymphs Abs: 1.4 10*3/uL (ref 0.7–4.0)
MCH: 31.7 pg (ref 26.0–34.0)
MCHC: 34 g/dL (ref 30.0–36.0)
MCV: 93.2 fL (ref 80.0–100.0)
Monocytes Absolute: 0.4 10*3/uL (ref 0.1–1.0)
Monocytes Relative: 11 %
Neutro Abs: 1.7 10*3/uL (ref 1.7–7.7)
Neutrophils Relative %: 46 %
Platelet Count: 155 10*3/uL (ref 150–400)
RBC: 3.85 MIL/uL — ABNORMAL LOW (ref 4.22–5.81)
RDW: 13.6 % (ref 11.5–15.5)
WBC Count: 3.6 10*3/uL — ABNORMAL LOW (ref 4.0–10.5)
nRBC: 0 % (ref 0.0–0.2)

## 2022-10-01 MED ORDER — BORTEZOMIB CHEMO SQ INJECTION 3.5 MG (2.5MG/ML)
0.9750 mg/m2 | Freq: Once | INTRAMUSCULAR | Status: AC
  Administered 2022-10-01: 2.5 mg via SUBCUTANEOUS
  Filled 2022-10-01: qty 1

## 2022-10-01 MED ORDER — LENALIDOMIDE 25 MG PO CAPS: ORAL_CAPSULE | ORAL | 0 refills | Status: DC

## 2022-10-01 NOTE — Progress Notes (Signed)
Per Dr Bertis Ruddy, ok to tx without CMP results.

## 2022-10-01 NOTE — Patient Instructions (Signed)
[facility]  Discharge Instructions: Thank you for choosing [facility] to provide your oncology and hematology care.   If you have a lab appointment with the Cancer Center, please go directly to the Cancer Center and check in at the registration area.   Wear comfortable clothing and clothing appropriate for easy access to any Portacath or PICC line.   We strive to give you quality time with your provider. You may need to reschedule your appointment if you arrive late (15 or more minutes).  Arriving late affects you and other patients whose appointments are after yours.  Also, if you miss three or more appointments without notifying the office, you may be dismissed from the clinic at the provider's discretion.      For prescription refill requests, have your pharmacy contact our office and allow 72 hours for refills to be completed.    Today you received the following chemotherapy and/or immunotherapy agents: Velcade        To help prevent nausea and vomiting after your treatment, we encourage you to take your nausea medication as directed.  BELOW ARE SYMPTOMS THAT SHOULD BE REPORTED IMMEDIATELY: *FEVER GREATER THAN 100.4 F (38 C) OR HIGHER *CHILLS OR SWEATING *NAUSEA AND VOMITING THAT IS NOT CONTROLLED WITH YOUR NAUSEA MEDICATION *UNUSUAL SHORTNESS OF BREATH *UNUSUAL BRUISING OR BLEEDING *URINARY PROBLEMS (pain or burning when urinating, or frequent urination) *BOWEL PROBLEMS (unusual diarrhea, constipation, pain near the anus) TENDERNESS IN MOUTH AND THROAT WITH OR WITHOUT PRESENCE OF ULCERS (sore throat, sores in mouth, or a toothache) UNUSUAL RASH, SWELLING OR PAIN  UNUSUAL VAGINAL DISCHARGE OR ITCHING   Items with * indicate a potential emergency and should be followed up as soon as possible or go to the Emergency Department if any problems should occur.  Please show the CHEMOTHERAPY ALERT CARD or IMMUNOTHERAPY ALERT CARD at  check-in to the Emergency Department and triage nurse.  Should you have questions after your visit or need to cancel or reschedule your appointment, please contact [facility]  Dept: [phone]  and follow the prompts.  Office hours are 8:00 a.m. to 4:30 p.m. Monday - Friday. Please note that voicemails left after 4:00 p.m. may not be returned until the following business day.  We are closed weekends and major holidays. You have access to a nurse at all times for urgent questions. Please call the main number to the clinic Dept: [phone] and follow the prompts.   For any non-urgent questions, you may also contact your provider using MyChart. We now offer e-Visits for anyone 18 and older to request care online for non-urgent symptoms. For details visit [email or web address].   Also download the MyChart app! Go to the app store, search "MyChart", open the app, select [facility], and log in with your MyChart username and password.  

## 2022-10-02 LAB — KAPPA/LAMBDA LIGHT CHAINS
Kappa free light chain: 13.9 mg/L (ref 3.3–19.4)
Kappa, lambda light chain ratio: 0.1 — ABNORMAL LOW (ref 0.26–1.65)
Lambda free light chains: 135 mg/L — ABNORMAL HIGH (ref 5.7–26.3)

## 2022-10-04 LAB — MULTIPLE MYELOMA PANEL, SERUM
Albumin SerPl Elph-Mcnc: 3.5 g/dL (ref 2.9–4.4)
Albumin/Glob SerPl: 1.7 (ref 0.7–1.7)
Alpha 1: 0.2 g/dL (ref 0.0–0.4)
Alpha2 Glob SerPl Elph-Mcnc: 0.5 g/dL (ref 0.4–1.0)
B-Globulin SerPl Elph-Mcnc: 0.8 g/dL (ref 0.7–1.3)
Gamma Glob SerPl Elph-Mcnc: 0.6 g/dL (ref 0.4–1.8)
Globulin, Total: 2.1 g/dL — ABNORMAL LOW (ref 2.2–3.9)
IgA: 59 mg/dL — ABNORMAL LOW (ref 90–386)
IgG (Immunoglobin G), Serum: 753 mg/dL (ref 603–1613)
IgM (Immunoglobulin M), Srm: 46 mg/dL (ref 20–172)
Total Protein ELP: 5.6 g/dL — ABNORMAL LOW (ref 6.0–8.5)

## 2022-10-08 ENCOUNTER — Inpatient Hospital Stay: Payer: No Typology Code available for payment source

## 2022-10-08 ENCOUNTER — Telehealth: Payer: Self-pay

## 2022-10-08 VITALS — BP 142/89 | HR 72 | Temp 98.2°F | Resp 18 | Wt 258.0 lb

## 2022-10-08 DIAGNOSIS — Z5112 Encounter for antineoplastic immunotherapy: Secondary | ICD-10-CM | POA: Diagnosis not present

## 2022-10-08 DIAGNOSIS — C9002 Multiple myeloma in relapse: Secondary | ICD-10-CM

## 2022-10-08 LAB — CMP (CANCER CENTER ONLY)
ALT: 20 U/L (ref 0–44)
AST: 15 U/L (ref 15–41)
Albumin: 4 g/dL (ref 3.5–5.0)
Alkaline Phosphatase: 36 U/L — ABNORMAL LOW (ref 38–126)
Anion gap: 5 (ref 5–15)
BUN: 17 mg/dL (ref 6–20)
CO2: 28 mmol/L (ref 22–32)
Calcium: 9.5 mg/dL (ref 8.9–10.3)
Chloride: 105 mmol/L (ref 98–111)
Creatinine: 1.06 mg/dL (ref 0.61–1.24)
GFR, Estimated: >60 mL/min (ref >60)
Glucose, Bld: 132 mg/dL — ABNORMAL HIGH (ref 70–99)
Potassium: 3.7 mmol/L (ref 3.5–5.1)
Sodium: 138 mmol/L (ref 135–145)
Total Bilirubin: 0.6 mg/dL (ref 0.3–1.2)
Total Protein: 6.3 g/dL — ABNORMAL LOW (ref 6.5–8.1)

## 2022-10-08 LAB — CBC WITH DIFFERENTIAL (CANCER CENTER ONLY)
Abs Immature Granulocytes: 0.01 10*3/uL (ref 0.00–0.07)
Basophils Absolute: 0.1 10*3/uL (ref 0.0–0.1)
Basophils Relative: 1 %
Eosinophils Absolute: 0.1 10*3/uL (ref 0.0–0.5)
Eosinophils Relative: 2 %
HCT: 37 % — ABNORMAL LOW (ref 39.0–52.0)
Hemoglobin: 12.9 g/dL — ABNORMAL LOW (ref 13.0–17.0)
Immature Granulocytes: 0 %
Lymphocytes Relative: 45 %
Lymphs Abs: 1.9 10*3/uL (ref 0.7–4.0)
MCH: 31.9 pg (ref 26.0–34.0)
MCHC: 34.9 g/dL (ref 30.0–36.0)
MCV: 91.6 fL (ref 80.0–100.0)
Monocytes Absolute: 0.5 10*3/uL (ref 0.1–1.0)
Monocytes Relative: 13 %
Neutro Abs: 1.6 10*3/uL — ABNORMAL LOW (ref 1.7–7.7)
Neutrophils Relative %: 39 %
Platelet Count: 198 10*3/uL (ref 150–400)
RBC: 4.04 MIL/uL — ABNORMAL LOW (ref 4.22–5.81)
RDW: 13.5 % (ref 11.5–15.5)
WBC Count: 4.2 10*3/uL (ref 4.0–10.5)
nRBC: 0 % (ref 0.0–0.2)

## 2022-10-08 MED ORDER — BORTEZOMIB CHEMO SQ INJECTION 3.5 MG (2.5MG/ML)
0.9750 mg/m2 | Freq: Once | INTRAMUSCULAR | Status: AC
  Administered 2022-10-08: 2.5 mg via SUBCUTANEOUS
  Filled 2022-10-08: qty 1

## 2022-10-08 NOTE — Patient Instructions (Signed)
[facility]  Discharge Instructions: Thank you for choosing [facility] to provide your oncology and hematology care.   If you have a lab appointment with the Cancer Center, please go directly to the Cancer Center and check in at the registration area.   Wear comfortable clothing and clothing appropriate for easy access to any Portacath or PICC line.   We strive to give you quality time with your provider. You may need to reschedule your appointment if you arrive late (15 or more minutes).  Arriving late affects you and other patients whose appointments are after yours.  Also, if you miss three or more appointments without notifying the office, you may be dismissed from the clinic at the provider's discretion.      For prescription refill requests, have your pharmacy contact our office and allow 72 hours for refills to be completed.    Today you received the following chemotherapy and/or immunotherapy agents: Velcade        To help prevent nausea and vomiting after your treatment, we encourage you to take your nausea medication as directed.  BELOW ARE SYMPTOMS THAT SHOULD BE REPORTED IMMEDIATELY: *FEVER GREATER THAN 100.4 F (38 C) OR HIGHER *CHILLS OR SWEATING *NAUSEA AND VOMITING THAT IS NOT CONTROLLED WITH YOUR NAUSEA MEDICATION *UNUSUAL SHORTNESS OF BREATH *UNUSUAL BRUISING OR BLEEDING *URINARY PROBLEMS (pain or burning when urinating, or frequent urination) *BOWEL PROBLEMS (unusual diarrhea, constipation, pain near the anus) TENDERNESS IN MOUTH AND THROAT WITH OR WITHOUT PRESENCE OF ULCERS (sore throat, sores in mouth, or a toothache) UNUSUAL RASH, SWELLING OR PAIN  UNUSUAL VAGINAL DISCHARGE OR ITCHING   Items with * indicate a potential emergency and should be followed up as soon as possible or go to the Emergency Department if any problems should occur.  Please show the CHEMOTHERAPY ALERT CARD or IMMUNOTHERAPY ALERT CARD at  check-in to the Emergency Department and triage nurse.  Should you have questions after your visit or need to cancel or reschedule your appointment, please contact [facility]  Dept: [phone]  and follow the prompts.  Office hours are 8:00 a.m. to 4:30 p.m. Monday - Friday. Please note that voicemails left after 4:00 p.m. may not be returned until the following business day.  We are closed weekends and major holidays. You have access to a nurse at all times for urgent questions. Please call the main number to the clinic Dept: [phone] and follow the prompts.   For any non-urgent questions, you may also contact your provider using MyChart. We now offer e-Visits for anyone 18 and older to request care online for non-urgent symptoms. For details visit [email or web address].   Also download the MyChart app! Go to the app store, search "MyChart", open the app, select [facility], and log in with your MyChart username and password.  

## 2022-10-08 NOTE — Progress Notes (Signed)
Due to delays in lab. Dr Bertis Ruddy is ok with [place] proceeding with treatment today with only the CBC results.

## 2022-10-08 NOTE — Telephone Encounter (Signed)
Returned his call and given biologics phone # to call to set up delivery of Revlimid Rx.

## 2022-10-15 ENCOUNTER — Inpatient Hospital Stay: Payer: No Typology Code available for payment source

## 2022-10-15 ENCOUNTER — Inpatient Hospital Stay (HOSPITAL_BASED_OUTPATIENT_CLINIC_OR_DEPARTMENT_OTHER): Payer: No Typology Code available for payment source | Admitting: Hematology and Oncology

## 2022-10-15 ENCOUNTER — Telehealth: Payer: Self-pay

## 2022-10-15 ENCOUNTER — Other Ambulatory Visit: Payer: Self-pay

## 2022-10-15 ENCOUNTER — Encounter: Payer: Self-pay | Admitting: Hematology and Oncology

## 2022-10-15 VITALS — BP 148/89 | HR 81 | Temp 98.0°F | Resp 18 | Ht 74.0 in | Wt 260.0 lb

## 2022-10-15 VITALS — BP 157/94 | HR 82 | Temp 98.4°F | Resp 16 | Wt 260.0 lb

## 2022-10-15 DIAGNOSIS — D61818 Other pancytopenia: Secondary | ICD-10-CM

## 2022-10-15 DIAGNOSIS — Z5112 Encounter for antineoplastic immunotherapy: Secondary | ICD-10-CM | POA: Diagnosis not present

## 2022-10-15 DIAGNOSIS — C9002 Multiple myeloma in relapse: Secondary | ICD-10-CM

## 2022-10-15 LAB — CMP (CANCER CENTER ONLY)
ALT: 16 U/L (ref 0–44)
AST: 14 U/L — ABNORMAL LOW (ref 15–41)
Albumin: 4 g/dL (ref 3.5–5.0)
Alkaline Phosphatase: 43 U/L (ref 38–126)
Anion gap: 7 (ref 5–15)
BUN: 17 mg/dL (ref 6–20)
CO2: 27 mmol/L (ref 22–32)
Calcium: 10.2 mg/dL (ref 8.9–10.3)
Chloride: 105 mmol/L (ref 98–111)
Creatinine: 0.98 mg/dL (ref 0.61–1.24)
GFR, Estimated: >60 mL/min (ref >60)
Glucose, Bld: 109 mg/dL — ABNORMAL HIGH (ref 70–99)
Potassium: 3.7 mmol/L (ref 3.5–5.1)
Sodium: 139 mmol/L (ref 135–145)
Total Bilirubin: 0.6 mg/dL (ref 0.3–1.2)
Total Protein: 6.4 g/dL — ABNORMAL LOW (ref 6.5–8.1)

## 2022-10-15 LAB — CBC WITH DIFFERENTIAL (CANCER CENTER ONLY)
Abs Immature Granulocytes: 0.01 10*3/uL (ref 0.00–0.07)
Basophils Absolute: 0 10*3/uL (ref 0.0–0.1)
Basophils Relative: 1 %
Eosinophils Absolute: 0.2 10*3/uL (ref 0.0–0.5)
Eosinophils Relative: 5 %
HCT: 36.6 % — ABNORMAL LOW (ref 39.0–52.0)
Hemoglobin: 13.2 g/dL (ref 13.0–17.0)
Immature Granulocytes: 0 %
Lymphocytes Relative: 40 %
Lymphs Abs: 1.4 10*3/uL (ref 0.7–4.0)
MCH: 32.7 pg (ref 26.0–34.0)
MCHC: 36.1 g/dL — ABNORMAL HIGH (ref 30.0–36.0)
MCV: 90.6 fL (ref 80.0–100.0)
Monocytes Absolute: 0.4 10*3/uL (ref 0.1–1.0)
Monocytes Relative: 11 %
Neutro Abs: 1.5 10*3/uL — ABNORMAL LOW (ref 1.7–7.7)
Neutrophils Relative %: 43 %
Platelet Count: 174 10*3/uL (ref 150–400)
RBC: 4.04 MIL/uL — ABNORMAL LOW (ref 4.22–5.81)
RDW: 13.2 % (ref 11.5–15.5)
WBC Count: 3.5 10*3/uL — ABNORMAL LOW (ref 4.0–10.5)
nRBC: 0 % (ref 0.0–0.2)

## 2022-10-15 MED ORDER — BORTEZOMIB CHEMO SQ INJECTION 3.5 MG (2.5MG/ML)
0.9750 mg/m2 | Freq: Once | INTRAMUSCULAR | Status: AC
  Administered 2022-10-15: 2.5 mg via SUBCUTANEOUS
  Filled 2022-10-15: qty 1

## 2022-10-15 NOTE — Patient Instructions (Signed)
[place] [facility]  Discharge Instructions: Thank you for choosing [facility] to provide your oncology and hematology care.   If you have a lab appointment with the Cancer Center, please go directly to the Cancer Center and check in at the registration area.   Wear comfortable clothing and clothing appropriate for easy access to any Portacath or PICC line.   We strive to give you quality time with your provider. You may need to reschedule your appointment if you arrive late (15 or more minutes).  Arriving late affects you and other patients whose appointments are after yours.  Also, if you miss three or more appointments without notifying the office, you may be dismissed from the clinic at the provider's discretion.      For prescription refill requests, have your pharmacy contact our office and allow 72 hours for refills to be completed.    Today you received the following chemotherapy and/or immunotherapy agents velcade      To help prevent nausea and vomiting after your treatment, we encourage you to take your nausea medication as directed.  BELOW ARE SYMPTOMS THAT SHOULD BE REPORTED IMMEDIATELY: *FEVER GREATER THAN 100.4 F (38 C) OR HIGHER *CHILLS OR SWEATING *NAUSEA AND VOMITING THAT IS NOT CONTROLLED WITH YOUR NAUSEA MEDICATION *UNUSUAL SHORTNESS OF BREATH *UNUSUAL BRUISING OR BLEEDING *URINARY PROBLEMS (pain or burning when urinating, or frequent urination) *BOWEL PROBLEMS (unusual diarrhea, constipation, pain near the anus) TENDERNESS IN MOUTH AND THROAT WITH OR WITHOUT PRESENCE OF ULCERS (sore throat, sores in mouth, or a toothache) UNUSUAL RASH, SWELLING OR PAIN  UNUSUAL VAGINAL DISCHARGE OR ITCHING   Items with * indicate a potential emergency and should be followed up as soon as possible or go to the Emergency Department if any problems should occur.  Please show the CHEMOTHERAPY ALERT CARD or IMMUNOTHERAPY ALERT CARD at check-in  to the Emergency Department and triage nurse.  Should you have questions after your visit or need to cancel or reschedule your appointment, please contact [place] [facility]  Dept: [phone]  and follow the prompts.  Office hours are 8:00 a.m. to 4:30 p.m. Monday - Friday. Please note that voicemails left after 4:00 p.m. may not be returned until the following business day.  We are closed weekends and major holidays. You have access to a nurse at all times for urgent questions. Please call the main number to the clinic Dept: [phone] and follow the prompts.   For any non-urgent questions, you may also contact your provider using MyChart. We now offer e-Visits for anyone 3 and older to request care online for non-urgent symptoms. For details visit [email or web address].   Also download the MyChart app! Go to the app store, search "MyChart", open the app, select [place], and log in with your MyChart username and password.

## 2022-10-15 NOTE — Assessment & Plan Note (Signed)
I reviewed his myeloma panel with the patient His recent free light chains went up a little bit but according to the patient, there was delay of delivery of Revlimid Overall, he tolerated treatment very well We will continue treatment as scheduled and I will repeat another myeloma panel/free light chain studies today If confirmed that he is heading in the right direction, I plan to reduce his weekly dexamethasone I plan to change future frequency of Velcade to every other week We discussed risk and benefits of referral to tertiary center for consideration for CAR-T cell and he is interested He will continue antimicrobial prophylaxis with acyclovir He will continue taking aspirin for DVT prophylaxis He will continue taking calcium and vitamin D for bone health.  He has scheduled dental appointment in November and he is not getting Zometa until he received dental clearance

## 2022-10-15 NOTE — Progress Notes (Signed)
[facility] OFFICE PROGRESS NOTE  Patient Care Team: Rometta [name], MD as PCP - General (Internal Medicine)  ASSESSMENT & PLAN:  Multiple myeloma in relapse [facility]) I reviewed his myeloma panel with the patient His recent free light chains went up a little bit but according to the patient, there was delay of delivery of Revlimid Overall, he tolerated treatment very well We will continue treatment as scheduled and I will repeat another myeloma panel/free light chain studies today If confirmed that he is heading in the right direction, I plan to reduce his weekly dexamethasone I plan to change future frequency of Velcade to every other week We discussed risk and benefits of referral to tertiary center for consideration for CAR-T cell and he is interested He will continue antimicrobial prophylaxis with acyclovir He will continue taking aspirin for DVT prophylaxis He will continue taking calcium and vitamin D for bone health.  He has scheduled dental appointment in November and he is not getting Zometa until he received dental clearance  Pancytopenia, acquired [facility]) He has intermittent mild pancytopenia due to treatment We will proceed with treatment without delay  Orders Placed This Encounter  Procedures   Multiple Myeloma Panel (SPEP&IFE w/QIG)    Standing Status:   Future    Standing Expiration Date:   11/05/2023   Kappa/lambda light chains    Standing Status:   Future    Standing Expiration Date:   11/05/2023   CBC with Differential (Cancer Center Only)    Standing Status:   Future    Standing Expiration Date:   11/05/2023   CMP (Cancer Center only)    Standing Status:   Future    Standing Expiration Date:   11/05/2023   Multiple Myeloma Panel (SPEP&IFE w/QIG)    Standing Status:   Future    Standing Expiration Date:   11/19/2023   Kappa/lambda light chains    Standing Status:   Future    Standing Expiration Date:   11/19/2023   CBC with Differential (Cancer  Center Only)    Standing Status:   Future    Standing Expiration Date:   11/19/2023   CMP (Cancer Center only)    Standing Status:   Future    Standing Expiration Date:   11/19/2023   Multiple Myeloma Panel (SPEP&IFE w/QIG)    Standing Status:   Future    Standing Expiration Date:   12/03/2023   Kappa/lambda light chains    Standing Status:   Future    Standing Expiration Date:   12/03/2023   CBC with Differential (Cancer Center Only)    Standing Status:   Future    Standing Expiration Date:   12/03/2023   CMP (Cancer Center only)    Standing Status:   Future    Standing Expiration Date:   12/03/2023   Multiple Myeloma Panel (SPEP&IFE w/QIG)    Standing Status:   Future    Standing Expiration Date:   12/17/2023   Kappa/lambda light chains    Standing Status:   Future    Standing Expiration Date:   12/17/2023   CBC with Differential (Cancer Center Only)    Standing Status:   Future    Standing Expiration Date:   12/17/2023   CMP (Cancer Center only)    Standing Status:   Future    Standing Expiration Date:   12/17/2023    All questions were answered. The patient knows to call the clinic with any problems, questions or concerns.  The total time spent in the appointment was 40 minutes encounter with patients including review of chart and various tests results, discussions about plan of care and coordination of care plan   Artis Delay, MD 10/15/2022 9:07 AM  INTERVAL HISTORY: Please see below for problem oriented charting. he returns for treatment follow-up on treatment with Revlimid, dexamethasone and Velcade He denies side effects such as neuropathy from Velcade He is compliant taking his medications as directed but stated that was a week of delay of delivery of Revlimid He has not obtained dental clearance yet and currently scheduled to see a dentist in November No recent infection, fever or chills We discussed changes in his future treatment and referral to [facility]  REVIEW OF  SYSTEMS:   Constitutional: Denies fevers, chills or abnormal weight loss Eyes: Denies blurriness of vision Ears, nose, mouth, throat, and face: Denies mucositis or sore throat Respiratory: Denies cough, dyspnea or wheezes Cardiovascular: Denies palpitation, chest discomfort or lower extremity swelling Gastrointestinal:  Denies nausea, heartburn or change in bowel habits Skin: Denies abnormal skin rashes Lymphatics: Denies new lymphadenopathy or easy bruising Neurological:Denies numbness, tingling or new weaknesses Behavioral/Psych: Mood is stable, no new changes  All other systems were reviewed with the patient and are negative.  I have reviewed the past medical history, past surgical history, social history and family history with the patient and they are unchanged from previous note.  ALLERGIES:  is allergic to daratumumab and heparin.  MEDICATIONS:  Current Outpatient Medications  Medication Sig Dispense Refill   acyclovir (ZOVIRAX) 400 MG tablet Take 1 tablet (400 mg total) by mouth 2 (two) times daily. 60 tablet 11   amoxicillin (AMOXIL) 500 MG capsule Take 1 capsule (500 mg total) by mouth 2 (two) times daily. 14 capsule 0   aspirin EC 81 MG tablet Take 81 mg by mouth daily. Swallow whole.     calcium carbonate (TUMS - DOSED IN MG ELEMENTAL CALCIUM) 500 MG chewable tablet Chew 1 tablet by mouth 3 (three) times daily.     cholecalciferol (VITAMIN D3) 25 MCG (1000 UT) tablet Take 1,000 Units by mouth daily.     dexamethasone (DECADRON) 4 MG tablet Take 3 tablets (12 mg total) by mouth once a week. Take 3 tablets weekly on Monday mornings with food 20 tablet 11   lenalidomide (REVLIMID) 25 MG capsule Take 1 capsule daily for 21 days, then stop 7 days, for cycle of every 28 days 21 capsule 0   ondansetron (ZOFRAN) 8 MG tablet Take 1 tablet (8 mg total) by mouth every 8 (eight) hours as needed for nausea or vomiting. 30 tablet 1   pantoprazole (PROTONIX) 40 MG tablet Take 1 tablet (40 mg  total) by mouth daily. 60 tablet 1   prochlorperazine (COMPAZINE) 10 MG tablet Take 1 tablet (10 mg total) by mouth every 6 (six) hours as needed for nausea or vomiting. 30 tablet 1   No current facility-administered medications for this visit.   Facility-Administered Medications Ordered in Other Visits  Medication Dose Route Frequency Provider Last Rate Last Admin   bortezomib SQ (VELCADE) chemo injection (2.5mg /mL concentration) 2.5 mg  0.975 mg/m2 (Treatment Plan Recorded) Subcutaneous Once Artis Delay, MD        SUMMARY OF ONCOLOGIC HISTORY: Oncology History  Multiple myeloma in relapse (HCC)  06/23/2015 - 06/28/2015 Hospital Admission   The patient was admitted to the hospital due to gait ataxia and back pain. He was subsequently found to have cord compression underwent surgery  and was discharged home   06/24/2015 Imaging   Abnormal appearance of the T6 vertebral body, highly suspicious for possible osseous metastasis. Associated pathologic fracture withup to 30% height loss. There is associated abnormal soft tissue density within the ventral epidural space,   06/24/2015 Imaging   MRI lumbar: Focal osseous lesion with abnormal enhancement involving the right pedicle of L3, suspicious for possible osseous metastasisgiven the findings in the thoracic spine. Question additional focal lesion within the right iliac wing as above.     06/25/2015 Pathology Results   Accession: [id number] bone biopsy come from plasma cell neoplasm.   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 Zometa is not given due to inability to get dental clearance   12/14/2015 - 04/24/2016 Chemotherapy   He is started on maintenance  treatment with Revlimid only   12/18/2015 Imaging   MRI thoracic and lumbar spine showed numerous enhancing foci throughout the thoracic and lumbar spine with several new small foci in the lumbar spine in comparison with prior MRI compatible with metastatic disease. Stable loss of height of the T3, T4, and T6 vertebral bodies and new postsurgical changes related to T6 laminectomy. No significant epidural disease or evidence for cord compression. No abnormal enhancement of the spinal cord or cauda equina.   05/02/2016 Bone Marrow Biopsy   Outside bone marrow biopsy showed 20% myeloma involvement   05/16/2016 Procedure   Successful placement of a right internal jugular approach power injectable Port-A-Cath. The catheter is ready for immediate use.   05/21/2016 - 07/03/2016 Chemotherapy   He received Kyprolis, Cytoxan and dexamethasone    07/08/2016 Procedure   Status post CT-guided bone marrow biopsy, with tissue specimen sent to pathology for complete histopathologic analysis   07/08/2016 Bone Marrow Biopsy   Bone Marrow, Aspirate,Biopsy, and Clot BONE MARROW: - MILDLY HYPERCELLULAR MARROW (60%) WITH PLASMA CELL NEOPLASM - SEE COMMENT PERIPHERAL BLOOD: - NORMOCYTIC ANEMIA Diagnosis Note The marrow is hypercellular with lambda-restricted plasma cells consistent with persistence of the patient's previously diagnosed plasma cell neoplasm. The plasma cells comprise approximately 10-15% of the total marrow cellularity, are enlarged, and arranged in clusters.   08/14/2016 Miscellaneous   He received conditioning treatment with melphalan   08/15/2016 Bone Marrow Transplant   He received autologous stem cell transplant   08/24/2016 - 08/29/2016 Hospital Admission   His post-transplant course was complicated by E-Coli bacteremia   11/26/2016 PET scan   PET CT at [facility] 1. Technically limited study due to soft tissue uptake. 2. New hypermetabolic uptake at C7 spinous process and left proximal femur  that is of questionable significance in absence of underlying CT correlate. Further assessment with whole body bone scan may be considered. 2. Redemonstrated nonhypermetabolic multifocal lucent and sclerotic lesions throughout the spine which are similar to prior.    12/02/2016 Bone Marrow Biopsy   He had repeat bone marrow biopsy at [facility] An immunohistochemical stain for CD138 is performed on the bone marrow core biopsy demonstrates increased plasma cells with focal clustering (10-20% overall), which are monotypic for lambda light chain by in situ hybridization.   01/06/2017 - 06/09/2017 Chemotherapy   He received weekly Dexamethasone, Pomalyst days 1-21 and Daratumumab. From 06/08/17 onwards, he is placed on maintenance  Pomalyst only.  He self discontinue Pomalyst in February 2021   07/23/2017 Procedure   Successful right IJ vein Port-A-Cath explant.   03/22/2021 Imaging   1. New large disc protrusion at L4-5 with severe spinal stenosis. 2. History of multiple myeloma with largely resolved diffuse bone marrow heterogeneity since 2017. New enhancing lesions in the L4 and L5 vertebral bodies without acute fracture. 3. Chronic thoracic compression fractures including severe T6 vertebral body height loss which has progressed from 2017. Resolved epidural tumor.     11/26/2021 PET scan   1. Hypermetabolic patchy lytic L5 vertebral lesion compatible with metabolically active multiple myeloma. 2. No additional sites of hypermetabolic skeletal or extraskeletal myeloma. 3. Mild colonic diverticulosis.   11/27/2021 - 11/27/2021 Chemotherapy   Patient is on Treatment Plan : MYELOMA Daratumumab IV + Pomalidomide + Dexamethasone q28d x 7 cycles     11/27/2021 - 05/07/2022 Chemotherapy   Patient is on Treatment Plan : MYELOMA RELAPSED REFRACTORY Daratumumab SQ + Pomalidomide + Dexamethasone (DaraPd) q28d     05/20/2022 -  Chemotherapy   Patient is on Treatment Plan : MYELOMA  RVD SQ q21d x 4 cycles        PHYSICAL EXAMINATION: ECOG PERFORMANCE STATUS: 0 - Asymptomatic  Vitals:   10/15/22 0836  BP: (!) 148/89  Pulse: 81  Resp: 18  Temp: 98 F (36.7 C)  SpO2: 99%   Filed Weights   10/15/22 0836  Weight: 260 lb (117.9 kg)    GENERAL:alert, no distress and comfortable NEURO: alert & oriented x 3 with fluent speech, no focal motor/sensory deficits  LABORATORY DATA:  I have reviewed the data as listed    Component Value Date/Time   NA 139 10/15/2022 0755   NA 139 01/27/2017 0809   K 3.7 10/15/2022 0755   K 4.1 01/27/2017 0809   CL 105 10/15/2022 0755   CO2 27 10/15/2022 0755   CO2 23 01/27/2017 0809   GLUCOSE 109 (H) 10/15/2022 0755   GLUCOSE 122 01/27/2017 0809   BUN 17 10/15/2022 0755   BUN 14.3 01/27/2017 0809   CREATININE 0.98 10/15/2022 0755   CREATININE 0.9 01/27/2017 0809   CALCIUM 10.2 10/15/2022 0755   CALCIUM 9.1 01/27/2017 0809   PROT 6.4 (L) 10/15/2022 0755   PROT 6.2 (L) 01/27/2017 0809   ALBUMIN 4.0 10/15/2022 0755   ALBUMIN 3.7 01/27/2017 0809   AST 14 (L) 10/15/2022 0755   AST 10 01/27/2017 0809   ALT 16 10/15/2022 0755   ALT 18 01/27/2017 0809   ALKPHOS 43 10/15/2022 0755   ALKPHOS 42 01/27/2017 0809   BILITOT 0.6 10/15/2022 0755   BILITOT 0.72 01/27/2017 0809   GFRNONAA >60 10/15/2022 0755   GFRAA >60 10/18/2019 1017    No results found for: "SPEP", "UPEP"  Lab Results  Component Value Date   WBC 3.5 (L) 10/15/2022   NEUTROABS 1.5 (L) 10/15/2022   HGB 13.2 10/15/2022   HCT 36.6 (L) 10/15/2022   MCV 90.6 10/15/2022   PLT 174 10/15/2022      Chemistry      Component Value Date/Time   NA 139 10/15/2022 0755   NA 139 01/27/2017 0809   K 3.7 10/15/2022 0755   K 4.1 01/27/2017 0809   CL 105 10/15/2022 0755   CO2 27 10/15/2022 0755   CO2 23 01/27/2017 0809   BUN 17 10/15/2022 0755   BUN 14.3 01/27/2017 0809   CREATININE 0.98 10/15/2022 0755   CREATININE 0.9 01/27/2017 0809  Component Value Date/Time   CALCIUM 10.2  10/15/2022 0755   CALCIUM 9.1 01/27/2017 0809   ALKPHOS 43 10/15/2022 0755   ALKPHOS 42 01/27/2017 0809   AST 14 (L) 10/15/2022 0755   AST 10 01/27/2017 0809   ALT 16 10/15/2022 0755   ALT 18 01/27/2017 0809   BILITOT 0.6 10/15/2022 0755   BILITOT 0.72 01/27/2017 0809

## 2022-10-15 NOTE — Telephone Encounter (Signed)
Faxed referral to [facility] for CAR-T therapy, faxed to [phone]. Received fax confirmation.

## 2022-10-15 NOTE — Assessment & Plan Note (Signed)
He has intermittent mild pancytopenia due to treatment We will proceed with treatment without delay

## 2022-10-17 ENCOUNTER — Telehealth: Payer: Self-pay | Admitting: *Deleted

## 2022-10-17 ENCOUNTER — Other Ambulatory Visit: Payer: Self-pay | Admitting: Hematology and Oncology

## 2022-10-17 NOTE — Telephone Encounter (Addendum)
-----   Message from Artis Delay sent at 10/17/2022  9:59 AM EDT ----- Let him know his test results are better I will send scheduling msg to add more appts  Contacted patient with message from MD as above. Patient verbalized understanding of this information.   Also informed him that per staff from [facility] CAR-T program, they are not in network with his insurance and that Dr. Bertis Ruddy was also informed of this. She recommends he contact his insurance company to find out of [facility] and/or [facility] are in network and let office know. Then a second referral can be sent. He said he would call his insurance company.

## 2022-10-20 LAB — MULTIPLE MYELOMA PANEL, SERUM
Albumin SerPl Elph-Mcnc: 3.7 g/dL (ref 2.9–4.4)
Albumin/Glob SerPl: 1.7 (ref 0.7–1.7)
Alpha 1: 0.2 g/dL (ref 0.0–0.4)
Alpha2 Glob SerPl Elph-Mcnc: 0.6 g/dL (ref 0.4–1.0)
B-Globulin SerPl Elph-Mcnc: 0.8 g/dL (ref 0.7–1.3)
Gamma Glob SerPl Elph-Mcnc: 0.6 g/dL (ref 0.4–1.8)
Globulin, Total: 2.2 g/dL (ref 2.2–3.9)
IgA: 51 mg/dL — ABNORMAL LOW (ref 90–386)
IgG (Immunoglobin G), Serum: 772 mg/dL (ref 603–1613)
IgM (Immunoglobulin M), Srm: 43 mg/dL (ref 20–172)
Total Protein ELP: 5.9 g/dL — ABNORMAL LOW (ref 6.0–8.5)

## 2022-10-22 ENCOUNTER — Other Ambulatory Visit: Payer: Self-pay

## 2022-10-22 ENCOUNTER — Inpatient Hospital Stay: Payer: No Typology Code available for payment source

## 2022-10-22 VITALS — BP 145/78 | HR 86 | Temp 98.2°F | Resp 18 | Wt 257.0 lb

## 2022-10-22 DIAGNOSIS — C9002 Multiple myeloma in relapse: Secondary | ICD-10-CM

## 2022-10-22 DIAGNOSIS — Z5112 Encounter for antineoplastic immunotherapy: Secondary | ICD-10-CM | POA: Diagnosis not present

## 2022-10-22 LAB — CMP (CANCER CENTER ONLY)
ALT: 21 U/L (ref 0–44)
AST: 16 U/L (ref 15–41)
Albumin: 4 g/dL (ref 3.5–5.0)
Alkaline Phosphatase: 42 U/L (ref 38–126)
Anion gap: 5 (ref 5–15)
BUN: 15 mg/dL (ref 6–20)
CO2: 29 mmol/L (ref 22–32)
Calcium: 9.6 mg/dL (ref 8.9–10.3)
Chloride: 106 mmol/L (ref 98–111)
Creatinine: 0.98 mg/dL (ref 0.61–1.24)
GFR, Estimated: >60 mL/min (ref >60)
Glucose, Bld: 111 mg/dL — ABNORMAL HIGH (ref 70–99)
Potassium: 3.8 mmol/L (ref 3.5–5.1)
Sodium: 140 mmol/L (ref 135–145)
Total Bilirubin: 0.5 mg/dL (ref 0.3–1.2)
Total Protein: 6.2 g/dL — ABNORMAL LOW (ref 6.5–8.1)

## 2022-10-22 LAB — CBC WITH DIFFERENTIAL (CANCER CENTER ONLY)
Abs Immature Granulocytes: 0.01 10*3/uL (ref 0.00–0.07)
Basophils Absolute: 0.1 10*3/uL (ref 0.0–0.1)
Basophils Relative: 1 %
Eosinophils Absolute: 0.3 10*3/uL (ref 0.0–0.5)
Eosinophils Relative: 8 %
HCT: 38.7 % — ABNORMAL LOW (ref 39.0–52.0)
Hemoglobin: 13.4 g/dL (ref 13.0–17.0)
Immature Granulocytes: 0 %
Lymphocytes Relative: 33 %
Lymphs Abs: 1.3 10*3/uL (ref 0.7–4.0)
MCH: 31.8 pg (ref 26.0–34.0)
MCHC: 34.6 g/dL (ref 30.0–36.0)
MCV: 91.9 fL (ref 80.0–100.0)
Monocytes Absolute: 0.6 10*3/uL (ref 0.1–1.0)
Monocytes Relative: 15 %
Neutro Abs: 1.7 10*3/uL (ref 1.7–7.7)
Neutrophils Relative %: 43 %
Platelet Count: 169 10*3/uL (ref 150–400)
RBC: 4.21 MIL/uL — ABNORMAL LOW (ref 4.22–5.81)
RDW: 13.2 % (ref 11.5–15.5)
WBC Count: 4.1 10*3/uL (ref 4.0–10.5)
nRBC: 0 % (ref 0.0–0.2)

## 2022-10-22 MED ORDER — BORTEZOMIB CHEMO SQ INJECTION 3.5 MG (2.5MG/ML)
0.9750 mg/m2 | Freq: Once | INTRAMUSCULAR | Status: AC
  Administered 2022-10-22: 2.5 mg via SUBCUTANEOUS
  Filled 2022-10-22: qty 1

## 2022-10-22 NOTE — Patient Instructions (Signed)
[place] [facility]  Discharge Instructions: Thank you for choosing [facility] to provide your oncology and hematology care.   If you have a lab appointment with the Cancer Center, please go directly to the Cancer Center and check in at the registration area.   Wear comfortable clothing and clothing appropriate for easy access to any Portacath or PICC line.   We strive to give you quality time with your provider. You may need to reschedule your appointment if you arrive late (15 or more minutes).  Arriving late affects you and other patients whose appointments are after yours.  Also, if you miss three or more appointments without notifying the office, you may be dismissed from the clinic at the provider's discretion.      For prescription refill requests, have your pharmacy contact our office and allow 72 hours for refills to be completed.    Today you received the following chemotherapy and/or immunotherapy agents velcade      To help prevent nausea and vomiting after your treatment, we encourage you to take your nausea medication as directed.  BELOW ARE SYMPTOMS THAT SHOULD BE REPORTED IMMEDIATELY: *FEVER GREATER THAN 100.4 F (38 C) OR HIGHER *CHILLS OR SWEATING *NAUSEA AND VOMITING THAT IS NOT CONTROLLED WITH YOUR NAUSEA MEDICATION *UNUSUAL SHORTNESS OF BREATH *UNUSUAL BRUISING OR BLEEDING *URINARY PROBLEMS (pain or burning when urinating, or frequent urination) *BOWEL PROBLEMS (unusual diarrhea, constipation, pain near the anus) TENDERNESS IN MOUTH AND THROAT WITH OR WITHOUT PRESENCE OF ULCERS (sore throat, sores in mouth, or a toothache) UNUSUAL RASH, SWELLING OR PAIN  UNUSUAL VAGINAL DISCHARGE OR ITCHING   Items with * indicate a potential emergency and should be followed up as soon as possible or go to the Emergency Department if any problems should occur.  Please show the CHEMOTHERAPY ALERT CARD or IMMUNOTHERAPY ALERT CARD at check-in  to the Emergency Department and triage nurse.  Should you have questions after your visit or need to cancel or reschedule your appointment, please contact [place] [facility]  Dept: [phone]  and follow the prompts.  Office hours are 8:00 a.m. to 4:30 p.m. Monday - Friday. Please note that voicemails left after 4:00 p.m. may not be returned until the following business day.  We are closed weekends and major holidays. You have access to a nurse at all times for urgent questions. Please call the main number to the clinic Dept: [phone] and follow the prompts.   For any non-urgent questions, you may also contact your provider using MyChart. We now offer e-Visits for anyone 14 and older to request care online for non-urgent symptoms. For details visit [email or web address].   Also download the MyChart app! Go to the app store, search "MyChart", open the app, select [place], and log in with your MyChart username and password.

## 2022-10-22 NOTE — Progress Notes (Signed)
Ok to treat today with only his CBC back per Dr. Bertis Ruddy.

## 2022-10-30 ENCOUNTER — Other Ambulatory Visit: Payer: Self-pay

## 2022-10-30 MED ORDER — LENALIDOMIDE 25 MG PO CAPS: ORAL_CAPSULE | ORAL | 0 refills | Status: DC

## 2022-11-05 ENCOUNTER — Inpatient Hospital Stay: Payer: No Typology Code available for payment source

## 2022-11-05 ENCOUNTER — Inpatient Hospital Stay: Payer: No Typology Code available for payment source | Attending: Internal Medicine

## 2022-11-05 VITALS — BP 155/94 | HR 74 | Temp 98.2°F | Resp 16 | Wt 262.0 lb

## 2022-11-05 DIAGNOSIS — C9002 Multiple myeloma in relapse: Secondary | ICD-10-CM

## 2022-11-05 DIAGNOSIS — Z5112 Encounter for antineoplastic immunotherapy: Secondary | ICD-10-CM | POA: Diagnosis not present

## 2022-11-05 DIAGNOSIS — Z79899 Other long term (current) drug therapy: Secondary | ICD-10-CM | POA: Diagnosis not present

## 2022-11-05 LAB — CBC WITH DIFFERENTIAL (CANCER CENTER ONLY)
Abs Immature Granulocytes: 0 10*3/uL (ref 0.00–0.07)
Basophils Absolute: 0.1 10*3/uL (ref 0.0–0.1)
Basophils Relative: 2 %
Eosinophils Absolute: 0.1 10*3/uL (ref 0.0–0.5)
Eosinophils Relative: 2 %
HCT: 38.5 % — ABNORMAL LOW (ref 39.0–52.0)
Hemoglobin: 13.2 g/dL (ref 13.0–17.0)
Immature Granulocytes: 0 %
Lymphocytes Relative: 40 %
Lymphs Abs: 1.6 10*3/uL (ref 0.7–4.0)
MCH: 31.7 pg (ref 26.0–34.0)
MCHC: 34.3 g/dL (ref 30.0–36.0)
MCV: 92.5 fL (ref 80.0–100.0)
Monocytes Absolute: 0.5 10*3/uL (ref 0.1–1.0)
Monocytes Relative: 13 %
Neutro Abs: 1.7 10*3/uL (ref 1.7–7.7)
Neutrophils Relative %: 43 %
Platelet Count: 183 10*3/uL (ref 150–400)
RBC: 4.16 MIL/uL — ABNORMAL LOW (ref 4.22–5.81)
RDW: 13.3 % (ref 11.5–15.5)
WBC Count: 4 10*3/uL (ref 4.0–10.5)
nRBC: 0 % (ref 0.0–0.2)

## 2022-11-05 LAB — CMP (CANCER CENTER ONLY)
ALT: 21 U/L (ref 0–44)
AST: 18 U/L (ref 15–41)
Albumin: 4.1 g/dL (ref 3.5–5.0)
Alkaline Phosphatase: 40 U/L (ref 38–126)
Anion gap: 5 (ref 5–15)
BUN: 25 mg/dL — ABNORMAL HIGH (ref 6–20)
CO2: 29 mmol/L (ref 22–32)
Calcium: 9.6 mg/dL (ref 8.9–10.3)
Chloride: 106 mmol/L (ref 98–111)
Creatinine: 1.35 mg/dL — ABNORMAL HIGH (ref 0.61–1.24)
GFR, Estimated: >60 mL/min (ref >60)
Glucose, Bld: 92 mg/dL (ref 70–99)
Potassium: 4.1 mmol/L (ref 3.5–5.1)
Sodium: 140 mmol/L (ref 135–145)
Total Bilirubin: 0.5 mg/dL (ref 0.3–1.2)
Total Protein: 6.5 g/dL (ref 6.5–8.1)

## 2022-11-05 MED ORDER — BORTEZOMIB CHEMO SQ INJECTION 3.5 MG (2.5MG/ML)
0.9750 mg/m2 | Freq: Once | INTRAMUSCULAR | Status: AC
  Administered 2022-11-05: 2.5 mg via SUBCUTANEOUS
  Filled 2022-11-05: qty 1

## 2022-11-05 NOTE — Progress Notes (Signed)
Pt states he took Dex at home prior to appointment time.

## 2022-11-05 NOTE — Patient Instructions (Signed)
[place] [facility]  Discharge Instructions: Thank you for choosing [facility] to provide your oncology and hematology care.   If you have a lab appointment with the Cancer Center, please go directly to the Cancer Center and check in at the registration area.   Wear comfortable clothing and clothing appropriate for easy access to any Portacath or PICC line.   We strive to give you quality time with your provider. You may need to reschedule your appointment if you arrive late (15 or more minutes).  Arriving late affects you and other patients whose appointments are after yours.  Also, if you miss three or more appointments without notifying the office, you may be dismissed from the clinic at the provider's discretion.      For prescription refill requests, have your pharmacy contact our office and allow 72 hours for refills to be completed.    Today you received the following chemotherapy and/or immunotherapy agents velcade      To help prevent nausea and vomiting after your treatment, we encourage you to take your nausea medication as directed.  BELOW ARE SYMPTOMS THAT SHOULD BE REPORTED IMMEDIATELY: *FEVER GREATER THAN 100.4 F (38 C) OR HIGHER *CHILLS OR SWEATING *NAUSEA AND VOMITING THAT IS NOT CONTROLLED WITH YOUR NAUSEA MEDICATION *UNUSUAL SHORTNESS OF BREATH *UNUSUAL BRUISING OR BLEEDING *URINARY PROBLEMS (pain or burning when urinating, or frequent urination) *BOWEL PROBLEMS (unusual diarrhea, constipation, pain near the anus) TENDERNESS IN MOUTH AND THROAT WITH OR WITHOUT PRESENCE OF ULCERS (sore throat, sores in mouth, or a toothache) UNUSUAL RASH, SWELLING OR PAIN  UNUSUAL VAGINAL DISCHARGE OR ITCHING   Items with * indicate a potential emergency and should be followed up as soon as possible or go to the Emergency Department if any problems should occur.  Please show the CHEMOTHERAPY ALERT CARD or IMMUNOTHERAPY ALERT CARD at check-in  to the Emergency Department and triage nurse.  Should you have questions after your visit or need to cancel or reschedule your appointment, please contact [place] [facility]  Dept: [phone]  and follow the prompts.  Office hours are 8:00 a.m. to 4:30 p.m. Monday - Friday. Please note that voicemails left after 4:00 p.m. may not be returned until the following business day.  We are closed weekends and major holidays. You have access to a nurse at all times for urgent questions. Please call the main number to the clinic Dept: [phone] and follow the prompts.   For any non-urgent questions, you may also contact your provider using MyChart. We now offer e-Visits for anyone 18 and older to request care online for non-urgent symptoms. For details visit [email or web address].   Also download the MyChart app! Go to the app store, search "MyChart", open the app, select [place], and log in with your MyChart username and password.   

## 2022-11-06 LAB — MULTIPLE MYELOMA PANEL, SERUM
Albumin SerPl Elph-Mcnc: 3.7 g/dL (ref 2.9–4.4)
Albumin/Glob SerPl: 1.5 (ref 0.7–1.7)
Alpha 1: 0.2 g/dL (ref 0.0–0.4)
Alpha2 Glob SerPl Elph-Mcnc: 0.6 g/dL (ref 0.4–1.0)
B-Globulin SerPl Elph-Mcnc: 0.9 g/dL (ref 0.7–1.3)
Gamma Glob SerPl Elph-Mcnc: 0.8 g/dL (ref 0.4–1.8)
Globulin, Total: 2.5 g/dL (ref 2.2–3.9)
IgA: 67 mg/dL — ABNORMAL LOW (ref 90–386)
IgG (Immunoglobin G), Serum: 849 mg/dL (ref 603–1613)
IgM (Immunoglobulin M), Srm: 41 mg/dL (ref 20–172)
Total Protein ELP: 6.2 g/dL (ref 6.0–8.5)

## 2022-11-06 LAB — KAPPA/LAMBDA LIGHT CHAINS
Kappa free light chain: 15.1 mg/L (ref 3.3–19.4)
Kappa, lambda light chain ratio: 0.12 — ABNORMAL LOW (ref 0.26–1.65)
Lambda free light chains: 130.7 mg/L — ABNORMAL HIGH (ref 5.7–26.3)

## 2022-11-08 ENCOUNTER — Telehealth: Payer: Self-pay

## 2022-11-08 NOTE — Telephone Encounter (Signed)
Called him per Dr. Bertis Ruddy, given message from "Myeloma panel is not better. I have asked him before to call insurance to inquire where to send him for consideration for CAR-T and he never got back to me. He needs to call his insurance and find out before I see him back."   He verbalized understanding and will call the office back after he speaking with his insurance company.

## 2022-11-14 ENCOUNTER — Telehealth: Payer: Self-pay

## 2022-11-14 NOTE — Telephone Encounter (Signed)
Returned his call. He spoke with insurance and they told him to send the referral to [facility] for the CAR-T therapy.

## 2022-11-14 NOTE — Telephone Encounter (Signed)
Faxed referral for CAR-T therapy to [facility] to [phone]. Received fax confirmation.

## 2022-11-18 ENCOUNTER — Other Ambulatory Visit: Payer: Self-pay

## 2022-11-19 ENCOUNTER — Inpatient Hospital Stay: Payer: No Typology Code available for payment source

## 2022-11-19 ENCOUNTER — Encounter: Payer: Self-pay | Admitting: Hematology and Oncology

## 2022-11-19 ENCOUNTER — Inpatient Hospital Stay (HOSPITAL_BASED_OUTPATIENT_CLINIC_OR_DEPARTMENT_OTHER): Payer: No Typology Code available for payment source | Admitting: Hematology and Oncology

## 2022-11-19 VITALS — BP 152/91 | HR 78 | Temp 97.4°F | Resp 18 | Ht 74.0 in | Wt 264.2 lb

## 2022-11-19 VITALS — BP 152/94 | HR 76 | Resp 16

## 2022-11-19 DIAGNOSIS — C9002 Multiple myeloma in relapse: Secondary | ICD-10-CM

## 2022-11-19 DIAGNOSIS — D696 Thrombocytopenia, unspecified: Secondary | ICD-10-CM | POA: Diagnosis not present

## 2022-11-19 DIAGNOSIS — Z5112 Encounter for antineoplastic immunotherapy: Secondary | ICD-10-CM | POA: Diagnosis not present

## 2022-11-19 LAB — CBC WITH DIFFERENTIAL (CANCER CENTER ONLY)
Abs Immature Granulocytes: 0.01 10*3/uL (ref 0.00–0.07)
Basophils Absolute: 0.1 10*3/uL (ref 0.0–0.1)
Basophils Relative: 1 %
Eosinophils Absolute: 0.2 10*3/uL (ref 0.0–0.5)
Eosinophils Relative: 3 %
HCT: 37.5 % — ABNORMAL LOW (ref 39.0–52.0)
Hemoglobin: 13.2 g/dL (ref 13.0–17.0)
Immature Granulocytes: 0 %
Lymphocytes Relative: 33 %
Lymphs Abs: 1.5 10*3/uL (ref 0.7–4.0)
MCH: 32.4 pg (ref 26.0–34.0)
MCHC: 35.2 g/dL (ref 30.0–36.0)
MCV: 91.9 fL (ref 80.0–100.0)
Monocytes Absolute: 0.6 10*3/uL (ref 0.1–1.0)
Monocytes Relative: 13 %
Neutro Abs: 2.2 10*3/uL (ref 1.7–7.7)
Neutrophils Relative %: 50 %
Platelet Count: 145 10*3/uL — ABNORMAL LOW (ref 150–400)
RBC: 4.08 MIL/uL — ABNORMAL LOW (ref 4.22–5.81)
RDW: 13.1 % (ref 11.5–15.5)
WBC Count: 4.6 10*3/uL (ref 4.0–10.5)
nRBC: 0 % (ref 0.0–0.2)

## 2022-11-19 LAB — CMP (CANCER CENTER ONLY)
ALT: 18 U/L (ref 0–44)
AST: 16 U/L (ref 15–41)
Albumin: 4.1 g/dL (ref 3.5–5.0)
Alkaline Phosphatase: 38 U/L (ref 38–126)
Anion gap: 5 (ref 5–15)
BUN: 19 mg/dL (ref 6–20)
CO2: 29 mmol/L (ref 22–32)
Calcium: 9.5 mg/dL (ref 8.9–10.3)
Chloride: 104 mmol/L (ref 98–111)
Creatinine: 1.11 mg/dL (ref 0.61–1.24)
GFR, Estimated: >60 mL/min (ref >60)
Glucose, Bld: 103 mg/dL — ABNORMAL HIGH (ref 70–99)
Potassium: 3.5 mmol/L (ref 3.5–5.1)
Sodium: 138 mmol/L (ref 135–145)
Total Bilirubin: 0.5 mg/dL (ref 0.3–1.2)
Total Protein: 6.4 g/dL — ABNORMAL LOW (ref 6.5–8.1)

## 2022-11-19 MED ORDER — BORTEZOMIB CHEMO SQ INJECTION 3.5 MG (2.5MG/ML)
0.9750 mg/m2 | Freq: Once | INTRAMUSCULAR | Status: AC
  Administered 2022-11-19: 2.5 mg via SUBCUTANEOUS
  Filled 2022-11-19: qty 1

## 2022-11-19 NOTE — Progress Notes (Signed)
[facility] OFFICE PROGRESS NOTE  Patient Care Team: Rometta [name], MD as PCP - General (Internal Medicine)  ASSESSMENT & PLAN:  Multiple myeloma in relapse [facility]) I reviewed his myeloma panel with the patient His recent free light chains went up a little bit but it is difficult to interpret in the setting of elevated BUN and creatinine which I suspect is due to dehydration Overall, he tolerated treatment very well We will continue treatment as scheduled and I will repeat another myeloma panel/free light chain studies today If confirmed that he is heading in the right direction, I plan to reduce his weekly dexamethasone We discussed risk and benefits of referral to tertiary center for consideration for CAR-T cell and he is interested, referral sent to [facility] last week He will continue antimicrobial prophylaxis with acyclovir He will continue taking aspirin for DVT prophylaxis He will continue taking calcium and vitamin D for bone health.  He has scheduled dental appointment in November and he is not getting Zometa until he received dental clearance  Thrombocytopenia (HCC) This is likely due to recent treatment. The patient denies recent history of bleeding such as epistaxis, hematuria or hematochezia. He is asymptomatic from the low platelet count. I will observe for now.  he does not require transfusion now. I will continue the chemotherapy at current dose without dosage adjustment.  If the thrombocytopenia gets progressive worse in the future, I might have to delay his treatment or adjust the chemotherapy dose.   No orders of the defined types were placed in this encounter.   All questions were answered. The patient knows to call the clinic with any problems, questions or concerns. The total time spent in the appointment was 20 minutes encounter with patients including review of chart and various tests results, discussions about plan of care and coordination of  care plan   Artis Delay, MD 11/19/2022 8:56 AM  INTERVAL HISTORY: Please see below for problem oriented charting. he returns for treatment today He denies recent infection We discussed recent myeloma panel and plan of care   REVIEW OF SYSTEMS:   Constitutional: Denies fevers, chills or abnormal weight loss Eyes: Denies blurriness of vision Ears, nose, mouth, throat, and face: Denies mucositis or sore throat Respiratory: Denies cough, dyspnea or wheezes Cardiovascular: Denies palpitation, chest discomfort or lower extremity swelling Gastrointestinal:  Denies nausea, heartburn or change in bowel habits Skin: Denies abnormal skin rashes Lymphatics: Denies new lymphadenopathy or easy bruising Neurological:Denies numbness, tingling or new weaknesses Behavioral/Psych: Mood is stable, no new changes  All other systems were reviewed with the patient and are negative.  I have reviewed the past medical history, past surgical history, social history and family history with the patient and they are unchanged from previous note.  ALLERGIES:  is allergic to daratumumab and heparin.  MEDICATIONS:  Current Outpatient Medications  Medication Sig Dispense Refill   acyclovir (ZOVIRAX) 400 MG tablet Take 1 tablet (400 mg total) by mouth 2 (two) times daily. 60 tablet 11   amoxicillin (AMOXIL) 500 MG capsule Take 1 capsule (500 mg total) by mouth 2 (two) times daily. 14 capsule 0   aspirin EC 81 MG tablet Take 81 mg by mouth daily. Swallow whole.     calcium carbonate (TUMS - DOSED IN MG ELEMENTAL CALCIUM) 500 MG chewable tablet Chew 1 tablet by mouth 3 (three) times daily.     cholecalciferol (VITAMIN D3) 25 MCG (1000 UT) tablet Take 1,000 Units by mouth daily.  dexamethasone (DECADRON) 4 MG tablet Take 3 tablets (12 mg total) by mouth once a week. Take 3 tablets weekly on Monday mornings with food 20 tablet 11   lenalidomide (REVLIMID) 25 MG capsule Take 1 capsule daily for 21 days, then stop 7  days, for cycle of every 28 days 21 capsule 0   ondansetron (ZOFRAN) 8 MG tablet Take 1 tablet (8 mg total) by mouth every 8 (eight) hours as needed for nausea or vomiting. 30 tablet 1   pantoprazole (PROTONIX) 40 MG tablet Take 1 tablet (40 mg total) by mouth daily. 60 tablet 1   prochlorperazine (COMPAZINE) 10 MG tablet Take 1 tablet (10 mg total) by mouth every 6 (six) hours as needed for nausea or vomiting. 30 tablet 1   No current facility-administered medications for this visit.    SUMMARY OF ONCOLOGIC HISTORY: Oncology History  Multiple myeloma in relapse [facility])  06/23/2015 - 06/28/2015 Hospital Admission   The patient was admitted to the hospital due to gait ataxia and back pain. He was subsequently found to have cord compression underwent surgery and was discharged home   06/24/2015 Imaging   Abnormal appearance of the T6 vertebral body, highly suspicious for possible osseous metastasis. Associated pathologic fracture withup to 30% height loss. There is associated abnormal soft tissue density within the ventral epidural space,   06/24/2015 Imaging   MRI lumbar: Focal osseous lesion with abnormal enhancement involving the right pedicle of L3, suspicious for possible osseous metastasisgiven the findings in the thoracic spine. Question additional focal lesion within the right iliac wing as above.     06/25/2015 Pathology Results   Accession: [id number] bone biopsy come from plasma cell neoplasm.   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 Zometa is not given due to inability to get dental clearance   12/14/2015 - 04/24/2016 Chemotherapy   He is started on maintenance treatment with Revlimid only    12/18/2015 Imaging   MRI thoracic and lumbar spine showed numerous enhancing foci throughout the thoracic and lumbar spine with several new small foci in the lumbar spine in comparison with prior MRI compatible with metastatic disease. Stable loss of height of the T3, T4, and T6 vertebral bodies and new postsurgical changes related to T6 laminectomy. No significant epidural disease or evidence for cord compression. No abnormal enhancement of the spinal cord or cauda equina.   05/02/2016 Bone Marrow Biopsy   Outside bone marrow biopsy showed 20% myeloma involvement   05/16/2016 Procedure   Successful placement of a right internal jugular approach power injectable Port-A-Cath. The catheter is ready for immediate use.   05/21/2016 - 07/03/2016 Chemotherapy   He received Kyprolis, Cytoxan and dexamethasone    07/08/2016 Procedure   Status post CT-guided bone marrow biopsy, with tissue specimen sent to pathology for complete histopathologic analysis   07/08/2016 Bone Marrow Biopsy   Bone Marrow, Aspirate,Biopsy, and Clot BONE MARROW: - MILDLY HYPERCELLULAR MARROW (60%) WITH PLASMA CELL NEOPLASM - SEE COMMENT PERIPHERAL BLOOD: - NORMOCYTIC ANEMIA Diagnosis Note The marrow is hypercellular with lambda-restricted plasma cells consistent with persistence of the patient's previously diagnosed plasma cell neoplasm. The plasma cells comprise approximately 10-15% of the total marrow cellularity, are enlarged, and arranged in clusters.  08/14/2016 Miscellaneous   He received conditioning treatment with melphalan   08/15/2016 Bone Marrow Transplant   He received autologous stem cell transplant   08/24/2016 - 08/29/2016 Hospital Admission   His post-transplant course was complicated by E-Coli bacteremia   11/26/2016 PET scan   [facility] at [facility] 1. Technically limited study due to soft tissue uptake. 2. New hypermetabolic uptake at C7 spinous process and left proximal femur that is of questionable  significance in absence of underlying CT correlate. Further assessment with whole body bone scan may be considered. 2. Redemonstrated nonhypermetabolic multifocal lucent and sclerotic lesions throughout the spine which are similar to prior.    12/02/2016 Bone Marrow Biopsy   He had repeat bone marrow biopsy at [facility] An immunohistochemical stain for CD138 is performed on the bone marrow core biopsy demonstrates increased plasma cells with focal clustering (10-20% overall), which are monotypic for lambda light chain by in situ hybridization.   01/06/2017 - 06/09/2017 Chemotherapy   He received weekly Dexamethasone, Pomalyst days 1-21 and Daratumumab. From 06/08/17 onwards, he is placed on maintenance Pomalyst only.  He self discontinue Pomalyst in February 2021   07/23/2017 Procedure   Successful right IJ vein Port-A-Cath explant.   03/22/2021 Imaging   1. New large disc protrusion at L4-5 with severe spinal stenosis. 2. History of multiple myeloma with largely resolved diffuse bone marrow heterogeneity since 2017. New enhancing lesions in the L4 and L5 vertebral bodies without acute fracture. 3. Chronic thoracic compression fractures including severe T6 vertebral body height loss which has progressed from 2017. Resolved epidural tumor.     11/26/2021 PET scan   1. Hypermetabolic patchy lytic L5 vertebral lesion compatible with metabolically active multiple myeloma. 2. No additional sites of hypermetabolic skeletal or extraskeletal myeloma. 3. Mild colonic diverticulosis.   11/27/2021 - 11/27/2021 Chemotherapy   Patient is on Treatment Plan : MYELOMA Daratumumab IV + Pomalidomide + Dexamethasone q28d x 7 cycles     11/27/2021 - 05/07/2022 Chemotherapy   Patient is on Treatment Plan : MYELOMA RELAPSED REFRACTORY Daratumumab SQ + Pomalidomide + Dexamethasone (DaraPd) q28d     05/20/2022 -  Chemotherapy   Patient is on Treatment Plan : MYELOMA  RVD SQ q21d x 4 cycles       PHYSICAL  EXAMINATION: ECOG PERFORMANCE STATUS: 0 - Asymptomatic  Vitals:   11/19/22 0840  BP: (!) 152/91  Pulse: 78  Resp: 18  Temp: (!) 97.4 F (36.3 C)  SpO2: 98%   Filed Weights   11/19/22 0840  Weight: 264 lb 3.2 oz (119.8 kg)    GENERAL:alert, no distress and comfortable   LABORATORY DATA:  I have reviewed the data as listed    Component Value Date/Time   NA 138 11/19/2022 0812   NA 139 01/27/2017 0809   K 3.5 11/19/2022 0812   K 4.1 01/27/2017 0809   CL 104 11/19/2022 0812   CO2 29 11/19/2022 0812   CO2 23 01/27/2017 0809   GLUCOSE 103 (H) 11/19/2022 0812   GLUCOSE 122 01/27/2017 0809   BUN 19 11/19/2022 0812   BUN 14.3 01/27/2017 0809   CREATININE 1.11 11/19/2022 0812   CREATININE 0.9 01/27/2017 0809   CALCIUM 9.5 11/19/2022 0812   CALCIUM 9.1 01/27/2017 0809   PROT 6.4 (L) 11/19/2022 0812   PROT 6.2 (L) 01/27/2017 0809   ALBUMIN 4.1 11/19/2022 0812   ALBUMIN 3.7 01/27/2017 0809   AST 16 11/19/2022 0812   AST 10 01/27/2017 0809   ALT 18  11/19/2022 0812   ALT 18 01/27/2017 0809   ALKPHOS 38 11/19/2022 0812   ALKPHOS 42 01/27/2017 0809   BILITOT 0.5 11/19/2022 0812   BILITOT 0.72 01/27/2017 0809   GFRNONAA >60 11/19/2022 0812   GFRAA >60 10/18/2019 1017    No results found for: "SPEP", "UPEP"  Lab Results  Component Value Date   WBC 4.6 11/19/2022   NEUTROABS 2.2 11/19/2022   HGB 13.2 11/19/2022   HCT 37.5 (L) 11/19/2022   MCV 91.9 11/19/2022   PLT 145 (L) 11/19/2022      Chemistry      Component Value Date/Time   NA 138 11/19/2022 0812   NA 139 01/27/2017 0809   K 3.5 11/19/2022 0812   K 4.1 01/27/2017 0809   CL 104 11/19/2022 0812   CO2 29 11/19/2022 0812   CO2 23 01/27/2017 0809   BUN 19 11/19/2022 0812   BUN 14.3 01/27/2017 0809   CREATININE 1.11 11/19/2022 0812   CREATININE 0.9 01/27/2017 0809      Component Value Date/Time   CALCIUM 9.5 11/19/2022 0812   CALCIUM 9.1 01/27/2017 0809   ALKPHOS 38 11/19/2022 0812   ALKPHOS 42  01/27/2017 0809   AST 16 11/19/2022 0812   AST 10 01/27/2017 0809   ALT 18 11/19/2022 0812   ALT 18 01/27/2017 0809   BILITOT 0.5 11/19/2022 0812   BILITOT 0.72 01/27/2017 0809

## 2022-11-19 NOTE — Assessment & Plan Note (Signed)
I reviewed his myeloma panel with the patient His recent free light chains went up a little bit but it is difficult to interpret in the setting of elevated BUN and creatinine which I suspect is due to dehydration Overall, he tolerated treatment very well We will continue treatment as scheduled and I will repeat another myeloma panel/free light chain studies today If confirmed that he is heading in the right direction, I plan to reduce his weekly dexamethasone We discussed risk and benefits of referral to tertiary center for consideration for CAR-T cell and he is interested, referral sent to [facility] last week He will continue antimicrobial prophylaxis with acyclovir He will continue taking aspirin for DVT prophylaxis He will continue taking calcium and vitamin D for bone health.  He has scheduled dental appointment in November and he is not getting Zometa until he received dental clearance

## 2022-11-19 NOTE — Progress Notes (Signed)
Patient took his steroid at home about 0700.

## 2022-11-19 NOTE — Patient Instructions (Signed)
[place] [facility]  Discharge Instructions: Thank you for choosing [facility] to provide your oncology and hematology care.   If you have a lab appointment with the Cancer Center, please go directly to the Cancer Center and check in at the registration area.   Wear comfortable clothing and clothing appropriate for easy access to any Portacath or PICC line.   We strive to give you quality time with your provider. You may need to reschedule your appointment if you arrive late (15 or more minutes).  Arriving late affects you and other patients whose appointments are after yours.  Also, if you miss three or more appointments without notifying the office, you may be dismissed from the clinic at the provider's discretion.      For prescription refill requests, have your pharmacy contact our office and allow 72 hours for refills to be completed.    Today you received the following chemotherapy and/or immunotherapy agents velcade      To help prevent nausea and vomiting after your treatment, we encourage you to take your nausea medication as directed.  BELOW ARE SYMPTOMS THAT SHOULD BE REPORTED IMMEDIATELY: *FEVER GREATER THAN 100.4 F (38 C) OR HIGHER *CHILLS OR SWEATING *NAUSEA AND VOMITING THAT IS NOT CONTROLLED WITH YOUR NAUSEA MEDICATION *UNUSUAL SHORTNESS OF BREATH *UNUSUAL BRUISING OR BLEEDING *URINARY PROBLEMS (pain or burning when urinating, or frequent urination) *BOWEL PROBLEMS (unusual diarrhea, constipation, pain near the anus) TENDERNESS IN MOUTH AND THROAT WITH OR WITHOUT PRESENCE OF ULCERS (sore throat, sores in mouth, or a toothache) UNUSUAL RASH, SWELLING OR PAIN  UNUSUAL VAGINAL DISCHARGE OR ITCHING   Items with * indicate a potential emergency and should be followed up as soon as possible or go to the Emergency Department if any problems should occur.  Please show the CHEMOTHERAPY ALERT CARD or IMMUNOTHERAPY ALERT CARD at check-in  to the Emergency Department and triage nurse.  Should you have questions after your visit or need to cancel or reschedule your appointment, please contact [place] [facility]  Dept: [phone]  and follow the prompts.  Office hours are 8:00 a.m. to 4:30 p.m. Monday - Friday. Please note that voicemails left after 4:00 p.m. may not be returned until the following business day.  We are closed weekends and major holidays. You have access to a nurse at all times for urgent questions. Please call the main number to the clinic Dept: [phone] and follow the prompts.   For any non-urgent questions, you may also contact your provider using MyChart. We now offer e-Visits for anyone 18 and older to request care online for non-urgent symptoms. For details visit [email or web address].   Also download the MyChart app! Go to the app store, search "MyChart", open the app, select [place], and log in with your MyChart username and password.   

## 2022-11-19 NOTE — Assessment & Plan Note (Signed)
This is likely due to recent treatment. The patient denies recent history of bleeding such as epistaxis, hematuria or hematochezia. He is asymptomatic from the low platelet count. I will observe for now.  he does not require transfusion now. I will continue the chemotherapy at current dose without dosage adjustment.  If the thrombocytopenia gets progressive worse in the future, I might have to delay his treatment or adjust the chemotherapy dose.   

## 2022-11-20 LAB — KAPPA/LAMBDA LIGHT CHAINS
Kappa free light chain: 17.7 mg/L (ref 3.3–19.4)
Kappa, lambda light chain ratio: 0.11 — ABNORMAL LOW (ref 0.26–1.65)
Lambda free light chains: 167.9 mg/L — ABNORMAL HIGH (ref 5.7–26.3)

## 2022-11-22 LAB — MULTIPLE MYELOMA PANEL, SERUM
Albumin SerPl Elph-Mcnc: 3.7 g/dL (ref 2.9–4.4)
Albumin/Glob SerPl: 1.5 (ref 0.7–1.7)
Alpha 1: 0.2 g/dL (ref 0.0–0.4)
Alpha2 Glob SerPl Elph-Mcnc: 0.6 g/dL (ref 0.4–1.0)
B-Globulin SerPl Elph-Mcnc: 0.9 g/dL (ref 0.7–1.3)
Gamma Glob SerPl Elph-Mcnc: 0.8 g/dL (ref 0.4–1.8)
Globulin, Total: 2.5 g/dL (ref 2.2–3.9)
IgA: 75 mg/dL — ABNORMAL LOW (ref 90–386)
IgG (Immunoglobin G), Serum: 824 mg/dL (ref 603–1613)
IgM (Immunoglobulin M), Srm: 38 mg/dL (ref 20–172)
Total Protein ELP: 6.2 g/dL (ref 6.0–8.5)

## 2022-11-25 ENCOUNTER — Other Ambulatory Visit: Payer: Self-pay

## 2022-11-25 DIAGNOSIS — Z6832 Body mass index (BMI) 32.0-32.9, adult: Secondary | ICD-10-CM | POA: Diagnosis not present

## 2022-11-25 DIAGNOSIS — Z008 Encounter for other general examination: Secondary | ICD-10-CM | POA: Diagnosis not present

## 2022-11-25 DIAGNOSIS — Z1211 Encounter for screening for malignant neoplasm of colon: Secondary | ICD-10-CM | POA: Diagnosis not present

## 2022-11-25 DIAGNOSIS — N189 Chronic kidney disease, unspecified: Secondary | ICD-10-CM | POA: Diagnosis not present

## 2022-11-25 DIAGNOSIS — Z9484 Stem cells transplant status: Secondary | ICD-10-CM | POA: Diagnosis not present

## 2022-11-25 DIAGNOSIS — D84821 Immunodeficiency due to drugs: Secondary | ICD-10-CM | POA: Diagnosis not present

## 2022-11-25 DIAGNOSIS — G620 Drug-induced polyneuropathy: Secondary | ICD-10-CM | POA: Diagnosis not present

## 2022-11-25 DIAGNOSIS — M810 Age-related osteoporosis without current pathological fracture: Secondary | ICD-10-CM | POA: Diagnosis not present

## 2022-11-25 DIAGNOSIS — I129 Hypertensive chronic kidney disease with stage 1 through stage 4 chronic kidney disease, or unspecified chronic kidney disease: Secondary | ICD-10-CM | POA: Diagnosis not present

## 2022-11-25 DIAGNOSIS — E669 Obesity, unspecified: Secondary | ICD-10-CM | POA: Diagnosis not present

## 2022-11-25 MED ORDER — LENALIDOMIDE 25 MG PO CAPS: ORAL_CAPSULE | ORAL | 0 refills | Status: DC

## 2022-11-26 DIAGNOSIS — C9001 Multiple myeloma in remission: Secondary | ICD-10-CM | POA: Diagnosis not present

## 2022-11-26 DIAGNOSIS — C9000 Multiple myeloma not having achieved remission: Secondary | ICD-10-CM | POA: Diagnosis not present

## 2022-12-03 ENCOUNTER — Inpatient Hospital Stay: Payer: No Typology Code available for payment source | Attending: Internal Medicine

## 2022-12-03 ENCOUNTER — Other Ambulatory Visit: Payer: Self-pay | Admitting: Hematology

## 2022-12-03 ENCOUNTER — Inpatient Hospital Stay: Payer: No Typology Code available for payment source

## 2022-12-03 ENCOUNTER — Encounter: Payer: Self-pay | Admitting: Hematology and Oncology

## 2022-12-03 VITALS — BP 158/98 | HR 74 | Temp 98.0°F | Resp 18 | Wt 262.5 lb

## 2022-12-03 DIAGNOSIS — C9002 Multiple myeloma in relapse: Secondary | ICD-10-CM | POA: Diagnosis not present

## 2022-12-03 DIAGNOSIS — Z79899 Other long term (current) drug therapy: Secondary | ICD-10-CM | POA: Insufficient documentation

## 2022-12-03 DIAGNOSIS — Z5112 Encounter for antineoplastic immunotherapy: Secondary | ICD-10-CM | POA: Insufficient documentation

## 2022-12-03 LAB — CBC WITH DIFFERENTIAL (CANCER CENTER ONLY)
Abs Immature Granulocytes: 0 10*3/uL (ref 0.00–0.07)
Basophils Absolute: 0 10*3/uL (ref 0.0–0.1)
Basophils Relative: 1 %
Eosinophils Absolute: 0.1 10*3/uL (ref 0.0–0.5)
Eosinophils Relative: 4 %
HCT: 38.2 % — ABNORMAL LOW (ref 39.0–52.0)
Hemoglobin: 13.8 g/dL (ref 13.0–17.0)
Immature Granulocytes: 0 %
Lymphocytes Relative: 50 %
Lymphs Abs: 1.6 10*3/uL (ref 0.7–4.0)
MCH: 32.5 pg (ref 26.0–34.0)
MCHC: 36.1 g/dL — ABNORMAL HIGH (ref 30.0–36.0)
MCV: 90.1 fL (ref 80.0–100.0)
Monocytes Absolute: 0.4 10*3/uL (ref 0.1–1.0)
Monocytes Relative: 13 %
Neutro Abs: 1 10*3/uL — ABNORMAL LOW (ref 1.7–7.7)
Neutrophils Relative %: 32 %
Platelet Count: 161 10*3/uL (ref 150–400)
RBC: 4.24 MIL/uL (ref 4.22–5.81)
RDW: 13.2 % (ref 11.5–15.5)
WBC Count: 3.2 10*3/uL — ABNORMAL LOW (ref 4.0–10.5)
nRBC: 0 % (ref 0.0–0.2)

## 2022-12-03 LAB — CMP (CANCER CENTER ONLY)
ALT: 20 U/L (ref 0–44)
AST: 19 U/L (ref 15–41)
Albumin: 4.2 g/dL (ref 3.5–5.0)
Alkaline Phosphatase: 39 U/L (ref 38–126)
Anion gap: 4 — ABNORMAL LOW (ref 5–15)
BUN: 20 mg/dL (ref 6–20)
CO2: 28 mmol/L (ref 22–32)
Calcium: 9.6 mg/dL (ref 8.9–10.3)
Chloride: 105 mmol/L (ref 98–111)
Creatinine: 1.07 mg/dL (ref 0.61–1.24)
GFR, Estimated: >60 mL/min (ref >60)
Glucose, Bld: 102 mg/dL — ABNORMAL HIGH (ref 70–99)
Potassium: 3.8 mmol/L (ref 3.5–5.1)
Sodium: 137 mmol/L (ref 135–145)
Total Bilirubin: 0.8 mg/dL (ref <1.2)
Total Protein: 6.7 g/dL (ref 6.5–8.1)

## 2022-12-03 MED ORDER — BORTEZOMIB CHEMO SQ INJECTION 3.5 MG (2.5MG/ML)
0.9750 mg/m2 | Freq: Once | INTRAMUSCULAR | Status: AC
  Administered 2022-12-03: 2.5 mg via SUBCUTANEOUS
  Filled 2022-12-03: qty 1

## 2022-12-03 NOTE — Progress Notes (Signed)
Patient states he took his dexamethasone at 0830 this morning.  Per Dr. Mosetta Putt, OK to treat with ANC 1.0.

## 2022-12-03 NOTE — Patient Instructions (Signed)
[facility] - A DEPT OF [facility]  Discharge Instructions: Thank you for choosing [facility] to provide your oncology and hematology care.   If you have a lab appointment with the Cancer Center, please go directly to the Cancer Center and check in at the registration area.   Wear comfortable clothing and clothing appropriate for easy access to any Portacath or PICC line.   We strive to give you quality time with your provider. You may need to reschedule your appointment if you arrive late (15 or more minutes).  Arriving late affects you and other patients whose appointments are after yours.  Also, if you miss three or more appointments without notifying the office, you may be dismissed from the clinic at the provider's discretion.      For prescription refill requests, have your pharmacy contact our office and allow 72 hours for refills to be completed.    Today you received the following chemotherapy and/or immunotherapy agents: Velcade      To help prevent nausea and vomiting after your treatment, we encourage you to take your nausea medication as directed.  BELOW ARE SYMPTOMS THAT SHOULD BE REPORTED IMMEDIATELY: *FEVER GREATER THAN 100.4 F (38 C) OR HIGHER *CHILLS OR SWEATING *NAUSEA AND VOMITING THAT IS NOT CONTROLLED WITH YOUR NAUSEA MEDICATION *UNUSUAL SHORTNESS OF BREATH *UNUSUAL BRUISING OR BLEEDING *URINARY PROBLEMS (pain or burning when urinating, or frequent urination) *BOWEL PROBLEMS (unusual diarrhea, constipation, pain near the anus) TENDERNESS IN MOUTH AND THROAT WITH OR WITHOUT PRESENCE OF ULCERS (sore throat, sores in mouth, or a toothache) UNUSUAL RASH, SWELLING OR PAIN  UNUSUAL VAGINAL DISCHARGE OR ITCHING   Items with * indicate a potential emergency and should be followed up as soon as possible or go to the Emergency Department if any problems should occur.  Please show the CHEMOTHERAPY ALERT CARD or IMMUNOTHERAPY  ALERT CARD at check-in to the Emergency Department and triage nurse.  Should you have questions after your visit or need to cancel or reschedule your appointment, please contact [facility] - A DEPT OF [facility]  Dept: [phone]  and follow the prompts.  Office hours are 8:00 a.m. to 4:30 p.m. Monday - Friday. Please note that voicemails left after 4:00 p.m. may not be returned until the following business day.  We are closed weekends and major holidays. You have access to a nurse at all times for urgent questions. Please call the main number to the clinic Dept: [phone] and follow the prompts.   For any non-urgent questions, you may also contact your provider using MyChart. We now offer e-Visits for anyone 72 and older to request care online for non-urgent symptoms. For details visit [email or web address].   Also download the MyChart app! Go to the app store, search "MyChart", open the app, select [place], and log in with your MyChart username and password.

## 2022-12-04 LAB — KAPPA/LAMBDA LIGHT CHAINS
Kappa free light chain: 13.6 mg/L (ref 3.3–19.4)
Kappa, lambda light chain ratio: 0.1 — ABNORMAL LOW (ref 0.26–1.65)
Lambda free light chains: 138.4 mg/L — ABNORMAL HIGH (ref 5.7–26.3)

## 2022-12-06 ENCOUNTER — Telehealth: Payer: Self-pay

## 2022-12-06 LAB — MULTIPLE MYELOMA PANEL, SERUM
Albumin SerPl Elph-Mcnc: 4 g/dL (ref 2.9–4.4)
Albumin/Glob SerPl: 1.7 (ref 0.7–1.7)
Alpha 1: 0.2 g/dL (ref 0.0–0.4)
Alpha2 Glob SerPl Elph-Mcnc: 0.6 g/dL (ref 0.4–1.0)
B-Globulin SerPl Elph-Mcnc: 0.9 g/dL (ref 0.7–1.3)
Gamma Glob SerPl Elph-Mcnc: 0.8 g/dL (ref 0.4–1.8)
Globulin, Total: 2.4 g/dL (ref 2.2–3.9)
IgA: 72 mg/dL — ABNORMAL LOW (ref 90–386)
IgG (Immunoglobin G), Serum: 921 mg/dL (ref 603–1613)
IgM (Immunoglobulin M), Srm: 36 mg/dL (ref 20–172)
Total Protein ELP: 6.4 g/dL (ref 6.0–8.5)

## 2022-12-06 NOTE — Telephone Encounter (Signed)
Patient had called to inform us that he had not been able to schedule shipment of his Lenalidomide, which had been sent in for refill on 10/28.  RN left message to remind patient to complete their survey and provided number to [employer] to see if they may be able to assist in scheduling shipment.  Provided clinic callback number should patient have any additional questions or concerns.

## 2022-12-17 ENCOUNTER — Inpatient Hospital Stay: Payer: No Typology Code available for payment source

## 2022-12-17 ENCOUNTER — Inpatient Hospital Stay (HOSPITAL_BASED_OUTPATIENT_CLINIC_OR_DEPARTMENT_OTHER): Payer: No Typology Code available for payment source | Admitting: Hematology and Oncology

## 2022-12-17 VITALS — BP 149/94 | HR 71 | Temp 98.5°F | Resp 18 | Ht 74.0 in | Wt 262.4 lb

## 2022-12-17 DIAGNOSIS — C9002 Multiple myeloma in relapse: Secondary | ICD-10-CM

## 2022-12-17 DIAGNOSIS — R519 Headache, unspecified: Secondary | ICD-10-CM | POA: Diagnosis not present

## 2022-12-17 DIAGNOSIS — D61818 Other pancytopenia: Secondary | ICD-10-CM | POA: Diagnosis not present

## 2022-12-17 DIAGNOSIS — Z5112 Encounter for antineoplastic immunotherapy: Secondary | ICD-10-CM | POA: Diagnosis not present

## 2022-12-17 LAB — CMP (CANCER CENTER ONLY)
ALT: 19 U/L (ref 0–44)
AST: 17 U/L (ref 15–41)
Albumin: 4.1 g/dL (ref 3.5–5.0)
Alkaline Phosphatase: 35 U/L — ABNORMAL LOW (ref 38–126)
Anion gap: 5 (ref 5–15)
BUN: 21 mg/dL — ABNORMAL HIGH (ref 6–20)
CO2: 26 mmol/L (ref 22–32)
Calcium: 9.3 mg/dL (ref 8.9–10.3)
Chloride: 106 mmol/L (ref 98–111)
Creatinine: 1.06 mg/dL (ref 0.61–1.24)
GFR, Estimated: >60 mL/min (ref >60)
Glucose, Bld: 131 mg/dL — ABNORMAL HIGH (ref 70–99)
Potassium: 3.5 mmol/L (ref 3.5–5.1)
Sodium: 137 mmol/L (ref 135–145)
Total Bilirubin: 0.8 mg/dL (ref <1.2)
Total Protein: 6.4 g/dL — ABNORMAL LOW (ref 6.5–8.1)

## 2022-12-17 LAB — CBC WITH DIFFERENTIAL (CANCER CENTER ONLY)
Abs Immature Granulocytes: 0.01 10*3/uL (ref 0.00–0.07)
Basophils Absolute: 0 10*3/uL (ref 0.0–0.1)
Basophils Relative: 1 %
Eosinophils Absolute: 0.1 10*3/uL (ref 0.0–0.5)
Eosinophils Relative: 3 %
HCT: 36.9 % — ABNORMAL LOW (ref 39.0–52.0)
Hemoglobin: 12.9 g/dL — ABNORMAL LOW (ref 13.0–17.0)
Immature Granulocytes: 0 %
Lymphocytes Relative: 36 %
Lymphs Abs: 1.3 10*3/uL (ref 0.7–4.0)
MCH: 32 pg (ref 26.0–34.0)
MCHC: 35 g/dL (ref 30.0–36.0)
MCV: 91.6 fL (ref 80.0–100.0)
Monocytes Absolute: 0.3 10*3/uL (ref 0.1–1.0)
Monocytes Relative: 10 %
Neutro Abs: 1.7 10*3/uL (ref 1.7–7.7)
Neutrophils Relative %: 50 %
Platelet Count: 143 10*3/uL — ABNORMAL LOW (ref 150–400)
RBC: 4.03 MIL/uL — ABNORMAL LOW (ref 4.22–5.81)
RDW: 12.8 % (ref 11.5–15.5)
WBC Count: 3.5 10*3/uL — ABNORMAL LOW (ref 4.0–10.5)
nRBC: 0 % (ref 0.0–0.2)

## 2022-12-17 MED ORDER — BORTEZOMIB CHEMO SQ INJECTION 3.5 MG (2.5MG/ML)
0.9750 mg/m2 | Freq: Once | INTRAMUSCULAR | Status: AC
Start: 2022-12-17 — End: 2022-12-17
  Administered 2022-12-17: 2.5 mg via SUBCUTANEOUS
  Filled 2022-12-17: qty 1

## 2022-12-17 NOTE — Patient Instructions (Signed)
 [facility] - A DEPT OF [facility]  Discharge Instructions: Thank you for choosing [facility] to provide your oncology and hematology care.   If you have a lab appointment with the Cancer Center, please go directly to the Cancer Center and check in at the registration area.   Wear comfortable clothing and clothing appropriate for easy access to any Portacath or PICC line.   We strive to give you quality time with your provider. You may need to reschedule your appointment if you arrive late (15 or more minutes).  Arriving late affects you and other patients whose appointments are after yours.  Also, if you miss three or more appointments without notifying the office, you may be dismissed from the clinic at the provider's discretion.      For prescription refill requests, have your pharmacy contact our office and allow 72 hours for refills to be completed.    Today you received the following chemotherapy and/or immunotherapy agents: Velcade      To help prevent nausea and vomiting after your treatment, we encourage you to take your nausea medication as directed.  BELOW ARE SYMPTOMS THAT SHOULD BE REPORTED IMMEDIATELY: *FEVER GREATER THAN 100.4 F (38 C) OR HIGHER *CHILLS OR SWEATING *NAUSEA AND VOMITING THAT IS NOT CONTROLLED WITH YOUR NAUSEA MEDICATION *UNUSUAL SHORTNESS OF BREATH *UNUSUAL BRUISING OR BLEEDING *URINARY PROBLEMS (pain or burning when urinating, or frequent urination) *BOWEL PROBLEMS (unusual diarrhea, constipation, pain near the anus) TENDERNESS IN MOUTH AND THROAT WITH OR WITHOUT PRESENCE OF ULCERS (sore throat, sores in mouth, or a toothache) UNUSUAL RASH, SWELLING OR PAIN  UNUSUAL VAGINAL DISCHARGE OR ITCHING   Items with * indicate a potential emergency and should be followed up as soon as possible or go to the Emergency Department if any problems should occur.  Please show the CHEMOTHERAPY ALERT CARD or IMMUNOTHERAPY  ALERT CARD at check-in to the Emergency Department and triage nurse.  Should you have questions after your visit or need to cancel or reschedule your appointment, please contact [facility] - A DEPT OF [facility]  Dept: [phone]  and follow the prompts.  Office hours are 8:00 a.m. to 4:30 p.m. Monday - Friday. Please note that voicemails left after 4:00 p.m. may not be returned until the following business day.  We are closed weekends and major holidays. You have access to a nurse at all times for urgent questions. Please call the main number to the clinic Dept: [phone] and follow the prompts.   For any non-urgent questions, you may also contact your provider using MyChart. We now offer e-Visits for anyone 72 and older to request care online for non-urgent symptoms. For details visit [email or web address].   Also download the MyChart app! Go to the app store, search "MyChart", open the app, select [place], and log in with your MyChart username and password.

## 2022-12-18 ENCOUNTER — Encounter: Payer: Self-pay | Admitting: Hematology and Oncology

## 2022-12-18 ENCOUNTER — Other Ambulatory Visit: Payer: Self-pay

## 2022-12-18 DIAGNOSIS — R519 Headache, unspecified: Secondary | ICD-10-CM | POA: Insufficient documentation

## 2022-12-18 LAB — KAPPA/LAMBDA LIGHT CHAINS
Kappa free light chain: 17.9 mg/L (ref 3.3–19.4)
Kappa, lambda light chain ratio: 0.1 — ABNORMAL LOW (ref 0.26–1.65)
Lambda free light chains: 182.9 mg/L — ABNORMAL HIGH (ref 5.7–26.3)

## 2022-12-18 NOTE — Assessment & Plan Note (Signed)
He has recent headaches of unknown etiology I could not figure out any precipitating factors We discussed risk and benefits of neurology consultation and the patient would like to defer for now He will continue conservative approach with over-the-counter acetaminophen as needed

## 2022-12-18 NOTE — Assessment & Plan Note (Signed)
I have reviewed recommendation from [facility] and discussed the recommendation with a hematologist I reviewed recent myeloma panel with the patient which show stable disease control Overall, he tolerated treatment well except for mild intermittent headaches We will continue treatment as scheduled Due to inability to get dental clearance, he has not received Zometa

## 2022-12-18 NOTE — Assessment & Plan Note (Signed)
He has intermittent mild pancytopenia due to treatment We will proceed with treatment without delay

## 2022-12-18 NOTE — Progress Notes (Signed)
[facility] OFFICE PROGRESS NOTE  Patient Care Team: Rometta [name], MD as PCP - General (Internal Medicine)  ASSESSMENT & PLAN:  Multiple myeloma in relapse [facility]) I have reviewed recommendation from [facility] and discussed the recommendation with a hematologist I reviewed recent myeloma panel with the patient which show stable disease control Overall, he tolerated treatment well except for mild intermittent headaches We will continue treatment as scheduled Due to inability to get dental clearance, he has not received Zometa  Pancytopenia, acquired [facility]) He has intermittent mild pancytopenia due to treatment We will proceed with treatment without delay  New onset of headaches He has recent headaches of unknown etiology I could not figure out any precipitating factors We discussed risk and benefits of neurology consultation and the patient would like to defer for now He will continue conservative approach with over-the-counter acetaminophen as needed  Orders Placed This Encounter  Procedures   Multiple Myeloma Panel (SPEP&IFE w/QIG)    Standing Status:   Future    Standing Expiration Date:   12/31/2023   Kappa/lambda light chains    Standing Status:   Future    Standing Expiration Date:   12/31/2023   CBC with Differential (Cancer Center Only)    Standing Status:   Future    Standing Expiration Date:   12/31/2023   CMP (Cancer Center only)    Standing Status:   Future    Standing Expiration Date:   12/31/2023   Multiple Myeloma Panel (SPEP&IFE w/QIG)    Standing Status:   Future    Standing Expiration Date:   01/14/2024   Kappa/lambda light chains    Standing Status:   Future    Standing Expiration Date:   01/14/2024   CBC with Differential (Cancer Center Only)    Standing Status:   Future    Standing Expiration Date:   01/14/2024   CMP (Cancer Center only)    Standing Status:   Future    Standing Expiration Date:   01/14/2024   Multiple Myeloma Panel  (SPEP&IFE w/QIG)    Standing Status:   Future    Standing Expiration Date:   02/04/2024   Kappa/lambda light chains    Standing Status:   Future    Standing Expiration Date:   02/04/2024   CBC with Differential (Cancer Center Only)    Standing Status:   Future    Standing Expiration Date:   02/04/2024   CMP (Cancer Center only)    Standing Status:   Future    Standing Expiration Date:   02/04/2024   Multiple Myeloma Panel (SPEP&IFE w/QIG)    Standing Status:   Future    Standing Expiration Date:   02/18/2024   Kappa/lambda light chains    Standing Status:   Future    Standing Expiration Date:   02/18/2024   CBC with Differential (Cancer Center Only)    Standing Status:   Future    Standing Expiration Date:   02/18/2024   CMP (Cancer Center only)    Standing Status:   Future    Standing Expiration Date:   02/18/2024    All questions were answered. The patient knows to call the clinic with any problems, questions or concerns. The total time spent in the appointment was 30 minutes encounter with patients including review of chart and various tests results, discussions about plan of care and coordination of care plan   Artis Delay, MD 12/18/2022 10:04 AM  INTERVAL HISTORY: Please see  below for problem oriented charting. he returns for chemotherapy follow-up He tolerated treatment well except for some recent headache We discussed recommendation from [facility] He denies new bone pain No recent infection Denies peripheral neuropathy  REVIEW OF SYSTEMS:   Constitutional: Denies fevers, chills or abnormal weight loss Eyes: Denies blurriness of vision Ears, nose, mouth, throat, and face: Denies mucositis or sore throat Respiratory: Denies cough, dyspnea or wheezes Cardiovascular: Denies palpitation, chest discomfort or lower extremity swelling Gastrointestinal:  Denies nausea, heartburn or change in bowel habits Skin: Denies abnormal skin rashes Lymphatics: Denies new lymphadenopathy or  easy bruising Neurological:Denies numbness, tingling or new weaknesses Behavioral/Psych: Mood is stable, no new changes  All other systems were reviewed with the patient and are negative.  I have reviewed the past medical history, past surgical history, social history and family history with the patient and they are unchanged from previous note.  ALLERGIES:  is allergic to daratumumab and heparin.  MEDICATIONS:  Current Outpatient Medications  Medication Sig Dispense Refill   acyclovir (ZOVIRAX) 400 MG tablet Take 1 tablet (400 mg total) by mouth 2 (two) times daily. 60 tablet 11   aspirin EC 81 MG tablet Take 81 mg by mouth daily. Swallow whole.     calcium carbonate (TUMS - DOSED IN MG ELEMENTAL CALCIUM) 500 MG chewable tablet Chew 1 tablet by mouth 3 (three) times daily.     cholecalciferol (VITAMIN D3) 25 MCG (1000 UT) tablet Take 1,000 Units by mouth daily.     dexamethasone (DECADRON) 4 MG tablet Take 3 tablets (12 mg total) by mouth once a week. Take 3 tablets weekly on Monday mornings with food 20 tablet 11   lenalidomide (REVLIMID) 25 MG capsule Take 1 capsule daily for 21 days, then stop 7 days, for cycle of every 28 days 21 capsule 0   ondansetron (ZOFRAN) 8 MG tablet Take 1 tablet (8 mg total) by mouth every 8 (eight) hours as needed for nausea or vomiting. 30 tablet 1   pantoprazole (PROTONIX) 40 MG tablet Take 1 tablet (40 mg total) by mouth daily. 60 tablet 1   prochlorperazine (COMPAZINE) 10 MG tablet Take 1 tablet (10 mg total) by mouth every 6 (six) hours as needed for nausea or vomiting. 30 tablet 1   No current facility-administered medications for this visit.    SUMMARY OF ONCOLOGIC HISTORY: Oncology History  Multiple myeloma in relapse [facility])  06/23/2015 - 06/28/2015 Hospital Admission   The patient was admitted to the hospital due to gait ataxia and back pain. He was subsequently found to have cord compression underwent surgery and was discharged home   06/24/2015  Imaging   Abnormal appearance of the T6 vertebral body, highly suspicious for possible osseous metastasis. Associated pathologic fracture withup to 30% height loss. There is associated abnormal soft tissue density within the ventral epidural space,   06/24/2015 Imaging   MRI lumbar: Focal osseous lesion with abnormal enhancement involving the right pedicle of L3, suspicious for possible osseous metastasisgiven the findings in the thoracic spine. Question additional focal lesion within the right iliac wing as above.     06/25/2015 Pathology Results   Accession: [id number] bone biopsy come from plasma cell neoplasm.   06/25/2015 Surgery   He had T6 laminectomy, bilateral transpedicular approach for resection of tumor, decompression of thecal sac and microdissection   07/20/2015 Bone Marrow Biopsy   BM biopsy showed 50% involvement; [employer]ogenetics 46XY, positive for 13q-   07/31/2015 - 11/03/2015 Chemotherapy   He  received Velcade, Revlimid and Dex. Zometa is not given due to inability to get dental clearance   12/14/2015 - 04/24/2016 Chemotherapy   He is started on maintenance treatment with Revlimid only   12/18/2015 Imaging   MRI thoracic and lumbar spine showed numerous enhancing foci throughout the thoracic and lumbar spine with several new small foci in the lumbar spine in comparison with prior MRI compatible with metastatic disease. Stable loss of height of the T3, T4, and T6 vertebral bodies and new postsurgical changes related to T6 laminectomy. No significant epidural disease or evidence for cord compression. No abnormal enhancement of the spinal cord or cauda equina.   05/02/2016 Bone Marrow Biopsy   Outside bone marrow biopsy showed 20% myeloma involvement   05/16/2016 Procedure   Successful placement of a right internal jugular approach power injectable Port-A-Cath. The catheter is ready for immediate use.   05/21/2016 - 07/03/2016 Chemotherapy   He received Kyprolis, Cytoxan and  dexamethasone    07/08/2016 Procedure   Status post CT-guided bone marrow biopsy, with tissue specimen sent to pathology for complete histopathologic analysis   07/08/2016 Bone Marrow Biopsy   Bone Marrow, Aspirate,Biopsy, and Clot BONE MARROW: - MILDLY HYPERCELLULAR MARROW (60%) WITH PLASMA CELL NEOPLASM - SEE COMMENT PERIPHERAL BLOOD: - NORMOCYTIC ANEMIA Diagnosis Note The marrow is hypercellular with lambda-restricted plasma cells consistent with persistence of the patient's previously diagnosed plasma cell neoplasm. The plasma cells comprise approximately 10-15% of the total marrow cellularity, are enlarged, and arranged in clusters.   08/14/2016 Miscellaneous   He received conditioning treatment with melphalan   08/15/2016 Bone Marrow Transplant   He received autologous stem cell transplant   08/24/2016 - 08/29/2016 Hospital Admission   His post-transplant course was complicated by E-Coli bacteremia   11/26/2016 PET scan   [facility] 1. Technically limited study due to soft tissue uptake. 2. New hypermetabolic uptake at C7 spinous process and left proximal femur that is of questionable significance in absence of underlying CT correlate. Further assessment with whole body bone scan may be considered. 2. Redemonstrated nonhypermetabolic multifocal lucent and sclerotic lesions throughout the spine which are similar to prior.    12/02/2016 Bone Marrow Biopsy   He had repeat bone marrow biopsy at [facility] An immunohistochemical stain for CD138 is performed on the bone marrow core biopsy demonstrates increased plasma cells with focal clustering (10-20% overall), which are monotypic for lambda light chain by in situ hybridization.   01/06/2017 - 06/09/2017 Chemotherapy   He received weekly Dexamethasone, Pomalyst days 1-21 and Daratumumab. From 06/08/17 onwards, he is placed on maintenance Pomalyst only.  He self discontinue Pomalyst in February 2021   07/23/2017 Procedure   Successful  right IJ vein Port-A-Cath explant.   03/22/2021 Imaging   1. New large disc protrusion at L4-5 with severe spinal stenosis. 2. History of multiple myeloma with largely resolved diffuse bone marrow heterogeneity since 2017. New enhancing lesions in the L4 and L5 vertebral bodies without acute fracture. 3. Chronic thoracic compression fractures including severe T6 vertebral body height loss which has progressed from 2017. Resolved epidural tumor.     11/26/2021 PET scan   1. Hypermetabolic patchy lytic L5 vertebral lesion compatible with metabolically active multiple myeloma. 2. No additional sites of hypermetabolic skeletal or extraskeletal myeloma. 3. Mild colonic diverticulosis.   11/27/2021 - 11/27/2021 Chemotherapy   Patient is on Treatment Plan : MYELOMA Daratumumab IV + Pomalidomide + Dexamethasone q28d x 7 cycles     11/27/2021 -  05/07/2022 Chemotherapy   Patient is on Treatment Plan : MYELOMA RELAPSED REFRACTORY Daratumumab SQ + Pomalidomide + Dexamethasone (DaraPd) q28d     05/20/2022 -  Chemotherapy   Patient is on Treatment Plan : MYELOMA  RVD SQ q21d x 4 cycles       PHYSICAL EXAMINATION: ECOG PERFORMANCE STATUS: 1 - Symptomatic but completely ambulatory  Vitals:   12/17/22 0908  BP: (!) 149/94  Pulse: 71  Resp: 18  Temp: 98.5 F (36.9 C)  SpO2: 98%   Filed Weights   12/17/22 0908  Weight: 262 lb 6.4 oz (119 kg)    GENERAL:alert, no distress and comfortable  LABORATORY DATA:  I have reviewed the data as listed    Component Value Date/Time   NA 137 12/17/2022 0838   NA 139 01/27/2017 0809   K 3.5 12/17/2022 0838   K 4.1 01/27/2017 0809   CL 106 12/17/2022 0838   CO2 26 12/17/2022 0838   CO2 23 01/27/2017 0809   GLUCOSE 131 (H) 12/17/2022 0838   GLUCOSE 122 01/27/2017 0809   BUN 21 (H) 12/17/2022 0838   BUN 14.3 01/27/2017 0809   CREATININE 1.06 12/17/2022 0838   CREATININE 0.9 01/27/2017 0809   CALCIUM 9.3 12/17/2022 0838   CALCIUM 9.1 01/27/2017  0809   PROT 6.4 (L) 12/17/2022 0838   PROT 6.2 (L) 01/27/2017 0809   ALBUMIN 4.1 12/17/2022 0838   ALBUMIN 3.7 01/27/2017 0809   AST 17 12/17/2022 0838   AST 10 01/27/2017 0809   ALT 19 12/17/2022 0838   ALT 18 01/27/2017 0809   ALKPHOS 35 (L) 12/17/2022 0838   ALKPHOS 42 01/27/2017 0809   BILITOT 0.8 12/17/2022 0838   BILITOT 0.72 01/27/2017 0809   GFRNONAA >60 12/17/2022 0838   GFRAA >60 10/18/2019 1017    No results found for: "SPEP", "UPEP"  Lab Results  Component Value Date   WBC 3.5 (L) 12/17/2022   NEUTROABS 1.7 12/17/2022   HGB 12.9 (L) 12/17/2022   HCT 36.9 (L) 12/17/2022   MCV 91.6 12/17/2022   PLT 143 (L) 12/17/2022      Chemistry      Component Value Date/Time   NA 137 12/17/2022 0838   NA 139 01/27/2017 0809   K 3.5 12/17/2022 0838   K 4.1 01/27/2017 0809   CL 106 12/17/2022 0838   CO2 26 12/17/2022 0838   CO2 23 01/27/2017 0809   BUN 21 (H) 12/17/2022 0838   BUN 14.3 01/27/2017 0809   CREATININE 1.06 12/17/2022 0838   CREATININE 0.9 01/27/2017 0809      Component Value Date/Time   CALCIUM 9.3 12/17/2022 0838   CALCIUM 9.1 01/27/2017 0809   ALKPHOS 35 (L) 12/17/2022 0838   ALKPHOS 42 01/27/2017 0809   AST 17 12/17/2022 0838   AST 10 01/27/2017 0809   ALT 19 12/17/2022 0838   ALT 18 01/27/2017 0809   BILITOT 0.8 12/17/2022 0838   BILITOT 0.72 01/27/2017 0809

## 2022-12-19 ENCOUNTER — Telehealth: Payer: Self-pay

## 2022-12-19 NOTE — Telephone Encounter (Signed)
Called and given below message. He verbalized understanding. 

## 2022-12-19 NOTE — Telephone Encounter (Signed)
-----   Message from Artis Delay sent at 12/19/2022  8:48 AM EST ----- Let him know his light chain studies are a bit worse. I will monitor for 1 more cycle before deciding to do anything different Continue current treatment for now

## 2022-12-19 NOTE — Telephone Encounter (Signed)
Attempted to call. Voicemail full unable to leave a message.

## 2022-12-22 LAB — MULTIPLE MYELOMA PANEL, SERUM
Albumin SerPl Elph-Mcnc: 4.2 g/dL (ref 2.9–4.4)
Albumin/Glob SerPl: 2.1 — ABNORMAL HIGH (ref 0.7–1.7)
Alpha 1: 0.1 g/dL (ref 0.0–0.4)
Alpha2 Glob SerPl Elph-Mcnc: 0.5 g/dL (ref 0.4–1.0)
B-Globulin SerPl Elph-Mcnc: 0.8 g/dL (ref 0.7–1.3)
Gamma Glob SerPl Elph-Mcnc: 0.7 g/dL (ref 0.4–1.8)
Globulin, Total: 2.1 g/dL — ABNORMAL LOW (ref 2.2–3.9)
IgA: 74 mg/dL — ABNORMAL LOW (ref 90–386)
IgG (Immunoglobin G), Serum: 911 mg/dL (ref 603–1613)
IgM (Immunoglobulin M), Srm: 43 mg/dL (ref 20–172)
Total Protein ELP: 6.3 g/dL (ref 6.0–8.5)

## 2022-12-28 ENCOUNTER — Other Ambulatory Visit: Payer: Self-pay

## 2022-12-31 ENCOUNTER — Other Ambulatory Visit: Payer: Self-pay

## 2022-12-31 ENCOUNTER — Inpatient Hospital Stay: Payer: No Typology Code available for payment source

## 2022-12-31 ENCOUNTER — Inpatient Hospital Stay: Payer: No Typology Code available for payment source | Attending: Internal Medicine

## 2022-12-31 VITALS — BP 148/94 | HR 70 | Temp 98.2°F | Resp 16 | Wt 265.8 lb

## 2022-12-31 DIAGNOSIS — C9002 Multiple myeloma in relapse: Secondary | ICD-10-CM | POA: Diagnosis not present

## 2022-12-31 DIAGNOSIS — Z79899 Other long term (current) drug therapy: Secondary | ICD-10-CM | POA: Insufficient documentation

## 2022-12-31 DIAGNOSIS — Z5112 Encounter for antineoplastic immunotherapy: Secondary | ICD-10-CM | POA: Diagnosis not present

## 2022-12-31 LAB — CBC WITH DIFFERENTIAL (CANCER CENTER ONLY)
Abs Immature Granulocytes: 0 10*3/uL (ref 0.00–0.07)
Basophils Absolute: 0 10*3/uL (ref 0.0–0.1)
Basophils Relative: 1 %
Eosinophils Absolute: 0.1 10*3/uL (ref 0.0–0.5)
Eosinophils Relative: 3 %
HCT: 37.8 % — ABNORMAL LOW (ref 39.0–52.0)
Hemoglobin: 13.3 g/dL (ref 13.0–17.0)
Immature Granulocytes: 0 %
Lymphocytes Relative: 47 %
Lymphs Abs: 1.7 10*3/uL (ref 0.7–4.0)
MCH: 32.2 pg (ref 26.0–34.0)
MCHC: 35.2 g/dL (ref 30.0–36.0)
MCV: 91.5 fL (ref 80.0–100.0)
Monocytes Absolute: 0.5 10*3/uL (ref 0.1–1.0)
Monocytes Relative: 15 %
Neutro Abs: 1.3 10*3/uL — ABNORMAL LOW (ref 1.7–7.7)
Neutrophils Relative %: 34 %
Platelet Count: 168 10*3/uL (ref 150–400)
RBC: 4.13 MIL/uL — ABNORMAL LOW (ref 4.22–5.81)
RDW: 12.9 % (ref 11.5–15.5)
WBC Count: 3.6 10*3/uL — ABNORMAL LOW (ref 4.0–10.5)
nRBC: 0 % (ref 0.0–0.2)

## 2022-12-31 LAB — CMP (CANCER CENTER ONLY)
ALT: 21 U/L (ref 0–44)
AST: 20 U/L (ref 15–41)
Albumin: 4.2 g/dL (ref 3.5–5.0)
Alkaline Phosphatase: 39 U/L (ref 38–126)
Anion gap: 6 (ref 5–15)
BUN: 20 mg/dL (ref 6–20)
CO2: 27 mmol/L (ref 22–32)
Calcium: 9.5 mg/dL (ref 8.9–10.3)
Chloride: 103 mmol/L (ref 98–111)
Creatinine: 1.07 mg/dL (ref 0.61–1.24)
GFR, Estimated: >60 mL/min (ref >60)
Glucose, Bld: 107 mg/dL — ABNORMAL HIGH (ref 70–99)
Potassium: 3.6 mmol/L (ref 3.5–5.1)
Sodium: 136 mmol/L (ref 135–145)
Total Bilirubin: 0.7 mg/dL (ref <1.2)
Total Protein: 6.5 g/dL (ref 6.5–8.1)

## 2022-12-31 MED ORDER — BORTEZOMIB CHEMO SQ INJECTION 3.5 MG (2.5MG/ML)
0.9750 mg/m2 | Freq: Once | INTRAMUSCULAR | Status: AC
Start: 2022-12-31 — End: 2022-12-31
  Administered 2022-12-31: 2.5 mg via SUBCUTANEOUS
  Filled 2022-12-31: qty 1

## 2022-12-31 MED ORDER — LENALIDOMIDE 25 MG PO CAPS: ORAL_CAPSULE | ORAL | 0 refills | Status: DC

## 2022-12-31 NOTE — Progress Notes (Signed)
Patient took his dexamethasone at about 0700 today.  Per Dr. Bertis Ruddy- proceed with treatment today with ANC of 1.3.

## 2022-12-31 NOTE — Patient Instructions (Signed)
[facility] - A DEPT OF [facility]  Discharge Instructions: Thank you for choosing [facility] to provide your oncology and hematology care.   If you have a lab appointment with the Cancer Center, please go directly to the Cancer Center and check in at the registration area.   Wear comfortable clothing and clothing appropriate for easy access to any Portacath or PICC line.   We strive to give you quality time with your provider. You may need to reschedule your appointment if you arrive late (15 or more minutes).  Arriving late affects you and other patients whose appointments are after yours.  Also, if you miss three or more appointments without notifying the office, you may be dismissed from the clinic at the provider's discretion.      For prescription refill requests, have your pharmacy contact our office and allow 72 hours for refills to be completed.    Today you received the following chemotherapy and/or immunotherapy agents Velcade      To help prevent nausea and vomiting after your treatment, we encourage you to take your nausea medication as directed.  BELOW ARE SYMPTOMS THAT SHOULD BE REPORTED IMMEDIATELY: *FEVER GREATER THAN 100.4 F (38 C) OR HIGHER *CHILLS OR SWEATING *NAUSEA AND VOMITING THAT IS NOT CONTROLLED WITH YOUR NAUSEA MEDICATION *UNUSUAL SHORTNESS OF BREATH *UNUSUAL BRUISING OR BLEEDING *URINARY PROBLEMS (pain or burning when urinating, or frequent urination) *BOWEL PROBLEMS (unusual diarrhea, constipation, pain near the anus) TENDERNESS IN MOUTH AND THROAT WITH OR WITHOUT PRESENCE OF ULCERS (sore throat, sores in mouth, or a toothache) UNUSUAL RASH, SWELLING OR PAIN  UNUSUAL VAGINAL DISCHARGE OR ITCHING   Items with * indicate a potential emergency and should be followed up as soon as possible or go to the Emergency Department if any problems should occur.  Please show the CHEMOTHERAPY ALERT CARD or IMMUNOTHERAPY  ALERT CARD at check-in to the Emergency Department and triage nurse.  Should you have questions after your visit or need to cancel or reschedule your appointment, please contact [facility] - A DEPT OF [facility]  Dept: [phone]  and follow the prompts.  Office hours are 8:00 a.m. to 4:30 p.m. Monday - Friday. Please note that voicemails left after 4:00 p.m. may not be returned until the following business day.  We are closed weekends and major holidays. You have access to a nurse at all times for urgent questions. Please call the main number to the clinic Dept: [phone] and follow the prompts.   For any non-urgent questions, you may also contact your provider using MyChart. We now offer e-Visits for anyone 48 and older to request care online for non-urgent symptoms. For details visit [email or web address].   Also download the MyChart app! Go to the app store, search "MyChart", open the app, select [place], and log in with your MyChart username and password.

## 2023-01-01 LAB — KAPPA/LAMBDA LIGHT CHAINS
Kappa free light chain: 19.2 mg/L (ref 3.3–19.4)
Kappa, lambda light chain ratio: 0.13 — ABNORMAL LOW (ref 0.26–1.65)
Lambda free light chains: 151.2 mg/L — ABNORMAL HIGH (ref 5.7–26.3)

## 2023-01-02 LAB — MULTIPLE MYELOMA PANEL, SERUM
Albumin SerPl Elph-Mcnc: 3.9 g/dL (ref 2.9–4.4)
Albumin/Glob SerPl: 1.6 (ref 0.7–1.7)
Alpha 1: 0.1 g/dL (ref 0.0–0.4)
Alpha2 Glob SerPl Elph-Mcnc: 0.6 g/dL (ref 0.4–1.0)
B-Globulin SerPl Elph-Mcnc: 0.8 g/dL (ref 0.7–1.3)
Gamma Glob SerPl Elph-Mcnc: 0.9 g/dL (ref 0.4–1.8)
Globulin, Total: 2.5 g/dL (ref 2.2–3.9)
IgA: 86 mg/dL — ABNORMAL LOW (ref 90–386)
IgG (Immunoglobin G), Serum: 978 mg/dL (ref 603–1613)
IgM (Immunoglobulin M), Srm: 42 mg/dL (ref 20–172)
Total Protein ELP: 6.4 g/dL (ref 6.0–8.5)

## 2023-01-03 ENCOUNTER — Telehealth: Payer: Self-pay

## 2023-01-03 NOTE — Telephone Encounter (Signed)
Returned his call. He has tried calling [facility] on 12/5 and 12/4. He speaks with someone and no one calls him back to set up delivery. Given [facility] # and told him to call the pharmacy back. He verbalized understanding and will call them now.

## 2023-01-06 ENCOUNTER — Other Ambulatory Visit: Payer: Self-pay

## 2023-01-14 ENCOUNTER — Inpatient Hospital Stay: Payer: No Typology Code available for payment source

## 2023-01-14 ENCOUNTER — Inpatient Hospital Stay (HOSPITAL_BASED_OUTPATIENT_CLINIC_OR_DEPARTMENT_OTHER): Payer: No Typology Code available for payment source | Admitting: Hematology and Oncology

## 2023-01-14 ENCOUNTER — Encounter: Payer: Self-pay | Admitting: Hematology and Oncology

## 2023-01-14 VITALS — BP 143/89 | HR 87

## 2023-01-14 VITALS — BP 155/111 | HR 87 | Temp 98.4°F | Resp 18 | Ht 74.0 in | Wt 264.2 lb

## 2023-01-14 DIAGNOSIS — D61818 Other pancytopenia: Secondary | ICD-10-CM

## 2023-01-14 DIAGNOSIS — C9002 Multiple myeloma in relapse: Secondary | ICD-10-CM | POA: Diagnosis not present

## 2023-01-14 DIAGNOSIS — Z5112 Encounter for antineoplastic immunotherapy: Secondary | ICD-10-CM | POA: Diagnosis not present

## 2023-01-14 DIAGNOSIS — R030 Elevated blood-pressure reading, without diagnosis of hypertension: Secondary | ICD-10-CM

## 2023-01-14 LAB — CMP (CANCER CENTER ONLY)
ALT: 23 U/L (ref 0–44)
AST: 18 U/L (ref 15–41)
Albumin: 4.3 g/dL (ref 3.5–5.0)
Alkaline Phosphatase: 37 U/L — ABNORMAL LOW (ref 38–126)
Anion gap: 5 (ref 5–15)
BUN: 22 mg/dL — ABNORMAL HIGH (ref 6–20)
CO2: 28 mmol/L (ref 22–32)
Calcium: 9.5 mg/dL (ref 8.9–10.3)
Chloride: 106 mmol/L (ref 98–111)
Creatinine: 1.09 mg/dL (ref 0.61–1.24)
GFR, Estimated: >60 mL/min (ref >60)
Glucose, Bld: 98 mg/dL (ref 70–99)
Potassium: 3.5 mmol/L (ref 3.5–5.1)
Sodium: 139 mmol/L (ref 135–145)
Total Bilirubin: 0.7 mg/dL (ref <1.2)
Total Protein: 6.6 g/dL (ref 6.5–8.1)

## 2023-01-14 LAB — CBC WITH DIFFERENTIAL (CANCER CENTER ONLY)
Abs Immature Granulocytes: 0 10*3/uL (ref 0.00–0.07)
Basophils Absolute: 0 10*3/uL (ref 0.0–0.1)
Basophils Relative: 1 %
Eosinophils Absolute: 0.1 10*3/uL (ref 0.0–0.5)
Eosinophils Relative: 3 %
HCT: 39.6 % (ref 39.0–52.0)
Hemoglobin: 14.1 g/dL (ref 13.0–17.0)
Immature Granulocytes: 0 %
Lymphocytes Relative: 35 %
Lymphs Abs: 1.2 10*3/uL (ref 0.7–4.0)
MCH: 32.5 pg (ref 26.0–34.0)
MCHC: 35.6 g/dL (ref 30.0–36.0)
MCV: 91.2 fL (ref 80.0–100.0)
Monocytes Absolute: 0.3 10*3/uL (ref 0.1–1.0)
Monocytes Relative: 10 %
Neutro Abs: 1.7 10*3/uL (ref 1.7–7.7)
Neutrophils Relative %: 51 %
Platelet Count: 154 10*3/uL (ref 150–400)
RBC: 4.34 MIL/uL (ref 4.22–5.81)
RDW: 12.9 % (ref 11.5–15.5)
WBC Count: 3.4 10*3/uL — ABNORMAL LOW (ref 4.0–10.5)
nRBC: 0 % (ref 0.0–0.2)

## 2023-01-14 MED ORDER — BORTEZOMIB CHEMO SQ INJECTION 3.5 MG (2.5MG/ML)
0.9750 mg/m2 | Freq: Once | INTRAMUSCULAR | Status: AC
  Administered 2023-01-14: 2.5 mg via SUBCUTANEOUS
  Filled 2023-01-14: qty 1

## 2023-01-14 MED ORDER — DEXAMETHASONE 4 MG PO TABS: 8.0000 mg | ORAL_TABLET | ORAL | Status: DC

## 2023-01-14 NOTE — Assessment & Plan Note (Signed)
I reviewed recent myeloma panel with the patient which show stable disease control Overall, he tolerated treatment well except for  weight gain We will continue treatment as scheduled Due to inability to get dental clearance, he has not received Zometa

## 2023-01-14 NOTE — Assessment & Plan Note (Signed)
His blood pressure is high and the patient has gained weight I plan to reduce dexamethasone and recommend the patient to check his blood pressure regularly

## 2023-01-14 NOTE — Progress Notes (Signed)
[facility] OFFICE PROGRESS NOTE  Patient Care Team: Rometta [name], MD as PCP - General (Internal Medicine)  ASSESSMENT & PLAN:  Multiple myeloma in relapse [facility]) I reviewed recent myeloma panel with the patient which show stable disease control Overall, he tolerated treatment well except for  weight gain We will continue treatment as scheduled Due to inability to get dental clearance, he has not received Zometa  Pancytopenia, acquired (HCC) He has intermittent mild pancytopenia due to treatment We will proceed with treatment without delay  Elevated BP without diagnosis of hypertension His blood pressure is high and the patient has gained weight I plan to reduce dexamethasone and recommend the patient to check his blood pressure regularly  No orders of the defined types were placed in this encounter.   All questions were answered. The patient knows to call the clinic with any problems, questions or concerns. The total time spent in the appointment was 20 minutes encounter with patients including review of chart and various tests results, discussions about plan of care and coordination of care plan   Artis Delay, MD 01/14/2023 9:55 AM  INTERVAL HISTORY: Please see below for problem oriented charting. he returns for surveillance follow-up while on treatment His blood pressure is high today He has occasional sensation of shortness of breath No chest pain today We discussed importance of blood pressure monitoring I reviewed myeloma panel with the patient  REVIEW OF SYSTEMS:   Constitutional: Denies fevers, chills or abnormal weight loss Eyes: Denies blurriness of vision Ears, nose, mouth, throat, and face: Denies mucositis or sore throat Gastrointestinal:  Denies nausea, heartburn or change in bowel habits Skin: Denies abnormal skin rashes Lymphatics: Denies new lymphadenopathy or easy bruising Neurological:Denies numbness, tingling or new  weaknesses Behavioral/Psych: Mood is stable, no new changes  All other systems were reviewed with the patient and are negative.  I have reviewed the past medical history, past surgical history, social history and family history with the patient and they are unchanged from previous note.  ALLERGIES:  is allergic to daratumumab and heparin.  MEDICATIONS:  Current Outpatient Medications  Medication Sig Dispense Refill   acyclovir (ZOVIRAX) 400 MG tablet Take 1 tablet (400 mg total) by mouth 2 (two) times daily. 60 tablet 11   aspirin EC 81 MG tablet Take 81 mg by mouth daily. Swallow whole.     calcium carbonate (TUMS - DOSED IN MG ELEMENTAL CALCIUM) 500 MG chewable tablet Chew 1 tablet by mouth 3 (three) times daily.     cholecalciferol (VITAMIN D3) 25 MCG (1000 UT) tablet Take 1,000 Units by mouth daily.     dexamethasone (DECADRON) 4 MG tablet Take 2 tablets (8 mg total) by mouth once a week. Take 2 tablets weekly on Monday mornings with food     lenalidomide (REVLIMID) 25 MG capsule Take 1 capsule daily for 21 days, then stop 7 days, for cycle of every 28 days 21 capsule 0   ondansetron (ZOFRAN) 8 MG tablet Take 1 tablet (8 mg total) by mouth every 8 (eight) hours as needed for nausea or vomiting. 30 tablet 1   pantoprazole (PROTONIX) 40 MG tablet Take 1 tablet (40 mg total) by mouth daily. 60 tablet 1   prochlorperazine (COMPAZINE) 10 MG tablet Take 1 tablet (10 mg total) by mouth every 6 (six) hours as needed for nausea or vomiting. 30 tablet 1   No current facility-administered medications for this visit.    SUMMARY OF ONCOLOGIC HISTORY: Oncology History  Multiple myeloma in relapse [facility])  06/23/2015 - 06/28/2015 Hospital Admission   The patient was admitted to the hospital due to gait ataxia and back pain. He was subsequently found to have cord compression underwent surgery and was discharged home   06/24/2015 Imaging   Abnormal appearance of the T6 vertebral body, highly suspicious  for possible osseous metastasis. Associated pathologic fracture withup to 30% height loss. There is associated abnormal soft tissue density within the ventral epidural space,   06/24/2015 Imaging   MRI lumbar: Focal osseous lesion with abnormal enhancement involving the right pedicle of L3, suspicious for possible osseous metastasisgiven the findings in the thoracic spine. Question additional focal lesion within the right iliac wing as above.     06/25/2015 Pathology Results   Accession: [id number] bone biopsy come from plasma cell neoplasm.   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 Zometa is not given due to inability to get dental clearance   12/14/2015 - 04/24/2016 Chemotherapy   He is started on maintenance treatment with Revlimid only   12/18/2015 Imaging   MRI thoracic and lumbar spine showed numerous enhancing foci throughout the thoracic and lumbar spine with several new small foci in the lumbar spine in comparison with prior MRI compatible with metastatic disease. Stable loss of height of the T3, T4, and T6 vertebral bodies and new postsurgical changes related to T6 laminectomy. No significant epidural disease or evidence for cord compression. No abnormal enhancement of the spinal cord or cauda equina.   05/02/2016 Bone Marrow Biopsy   Outside bone marrow biopsy showed 20% myeloma involvement   05/16/2016 Procedure   Successful placement of a right internal jugular approach power injectable Port-A-Cath. The catheter is ready for immediate use.   05/21/2016 - 07/03/2016 Chemotherapy   He received Kyprolis, Cytoxan and dexamethasone    07/08/2016 Procedure   Status post CT-guided bone marrow biopsy,  with tissue specimen sent to pathology for complete histopathologic analysis   07/08/2016 Bone Marrow Biopsy   Bone Marrow, Aspirate,Biopsy, and Clot BONE MARROW: - MILDLY HYPERCELLULAR MARROW (60%) WITH PLASMA CELL NEOPLASM - SEE COMMENT PERIPHERAL BLOOD: - NORMOCYTIC ANEMIA Diagnosis Note The marrow is hypercellular with lambda-restricted plasma cells consistent with persistence of the patient's previously diagnosed plasma cell neoplasm. The plasma cells comprise approximately 10-15% of the total marrow cellularity, are enlarged, and arranged in clusters.   08/14/2016 Miscellaneous   He received conditioning treatment with melphalan   08/15/2016 Bone Marrow Transplant   He received autologous stem cell transplant   08/24/2016 - 08/29/2016 Hospital Admission   His post-transplant course was complicated by E-Coli bacteremia   11/26/2016 PET scan   PET CT at [facility] 1. Technically limited study due to soft tissue uptake. 2. New hypermetabolic uptake at C7 spinous process and left proximal femur that is of questionable significance in absence of underlying CT correlate. Further assessment with whole body bone scan may be considered. 2. Redemonstrated nonhypermetabolic multifocal lucent and sclerotic lesions throughout the spine which are similar to prior.    12/02/2016 Bone Marrow Biopsy   He had repeat bone marrow biopsy at [facility] An immunohistochemical stain for CD138 is performed on the bone marrow core biopsy demonstrates increased plasma cells with focal clustering (10-20%  overall), which are monotypic for lambda light chain by in situ hybridization.   01/06/2017 - 06/09/2017 Chemotherapy   He received weekly Dexamethasone, Pomalyst days 1-21 and Daratumumab. From 06/08/17 onwards, he is placed on maintenance Pomalyst only.  He self discontinue Pomalyst in February 2021   07/23/2017 Procedure   Successful right IJ vein Port-A-Cath explant.   03/22/2021 Imaging   1. New large disc  protrusion at L4-5 with severe spinal stenosis. 2. History of multiple myeloma with largely resolved diffuse bone marrow heterogeneity since 2017. New enhancing lesions in the L4 and L5 vertebral bodies without acute fracture. 3. Chronic thoracic compression fractures including severe T6 vertebral body height loss which has progressed from 2017. Resolved epidural tumor.     11/26/2021 PET scan   1. Hypermetabolic patchy lytic L5 vertebral lesion compatible with metabolically active multiple myeloma. 2. No additional sites of hypermetabolic skeletal or extraskeletal myeloma. 3. Mild colonic diverticulosis.   11/27/2021 - 11/27/2021 Chemotherapy   Patient is on Treatment Plan : MYELOMA Daratumumab IV + Pomalidomide + Dexamethasone q28d x 7 cycles     11/27/2021 - 05/07/2022 Chemotherapy   Patient is on Treatment Plan : MYELOMA RELAPSED REFRACTORY Daratumumab SQ + Pomalidomide + Dexamethasone (DaraPd) q28d     05/20/2022 -  Chemotherapy   Patient is on Treatment Plan : MYELOMA  RVD SQ q21d x 4 cycles       PHYSICAL EXAMINATION: ECOG PERFORMANCE STATUS: 1 - Symptomatic but completely ambulatory  Vitals:   01/14/23 0926  BP: (!) 155/111  Pulse: 87  Resp: 18  Temp: 98.4 F (36.9 C)  SpO2: 99%   Filed Weights   01/14/23 0926  Weight: 264 lb 3.2 oz (119.8 kg)    GENERAL:alert, no distress and comfortable NEURO: alert & oriented x 3 with fluent speech, no focal motor/sensory deficits  LABORATORY DATA:  I have reviewed the data as listed    Component Value Date/Time   NA 139 01/14/2023 0905   NA 139 01/27/2017 0809   K 3.5 01/14/2023 0905   K 4.1 01/27/2017 0809   CL 106 01/14/2023 0905   CO2 28 01/14/2023 0905   CO2 23 01/27/2017 0809   GLUCOSE 98 01/14/2023 0905   GLUCOSE 122 01/27/2017 0809   BUN 22 (H) 01/14/2023 0905   BUN 14.3 01/27/2017 0809   CREATININE 1.09 01/14/2023 0905   CREATININE 0.9 01/27/2017 0809   CALCIUM 9.5 01/14/2023 0905   CALCIUM 9.1 01/27/2017  0809   PROT 6.6 01/14/2023 0905   PROT 6.2 (L) 01/27/2017 0809   ALBUMIN 4.3 01/14/2023 0905   ALBUMIN 3.7 01/27/2017 0809   AST 18 01/14/2023 0905   AST 10 01/27/2017 0809   ALT 23 01/14/2023 0905   ALT 18 01/27/2017 0809   ALKPHOS 37 (L) 01/14/2023 0905   ALKPHOS 42 01/27/2017 0809   BILITOT 0.7 01/14/2023 0905   BILITOT 0.72 01/27/2017 0809   GFRNONAA >60 01/14/2023 0905   GFRAA >60 10/18/2019 1017    No results found for: "SPEP", "UPEP"  Lab Results  Component Value Date   WBC 3.4 (L) 01/14/2023   NEUTROABS 1.7 01/14/2023   HGB 14.1 01/14/2023   HCT 39.6 01/14/2023   MCV 91.2 01/14/2023   PLT 154 01/14/2023      Chemistry      Component Value Date/Time   NA 139 01/14/2023 0905   NA 139 01/27/2017 0809   K 3.5 01/14/2023 0905   K 4.1 01/27/2017 0809   CL 106 01/14/2023  0905   CO2 28 01/14/2023 0905   CO2 23 01/27/2017 0809   BUN 22 (H) 01/14/2023 0905   BUN 14.3 01/27/2017 0809   CREATININE 1.09 01/14/2023 0905   CREATININE 0.9 01/27/2017 0809      Component Value Date/Time   CALCIUM 9.5 01/14/2023 0905   CALCIUM 9.1 01/27/2017 0809   ALKPHOS 37 (L) 01/14/2023 0905   ALKPHOS 42 01/27/2017 0809   AST 18 01/14/2023 0905   AST 10 01/27/2017 0809   ALT 23 01/14/2023 0905   ALT 18 01/27/2017 0809   BILITOT 0.7 01/14/2023 0905   BILITOT 0.72 01/27/2017 0809

## 2023-01-14 NOTE — Assessment & Plan Note (Signed)
He has intermittent mild pancytopenia due to treatment We will proceed with treatment without delay

## 2023-01-14 NOTE — Patient Instructions (Signed)
 [facility]  Discharge Instructions: Thank you for choosing [facility] to provide your oncology and hematology care.   If you have a lab appointment with the Cancer Center, please go directly to the Cancer Center and check in at the registration area.   Wear comfortable clothing and clothing appropriate for easy access to any Portacath or PICC line.   We strive to give you quality time with your provider. You may need to reschedule your appointment if you arrive late (15 or more minutes).  Arriving late affects you and other patients whose appointments are after yours.  Also, if you miss three or more appointments without notifying the office, you may be dismissed from the clinic at the provider's discretion.      For prescription refill requests, have your pharmacy contact our office and allow 72 hours for refills to be completed.    Today you received the following chemotherapy and/or immunotherapy agents Velcade      To help prevent nausea and vomiting after your treatment, we encourage you to take your nausea medication as directed.  BELOW ARE SYMPTOMS THAT SHOULD BE REPORTED IMMEDIATELY: *FEVER GREATER THAN 100.4 F (38 C) OR HIGHER *CHILLS OR SWEATING *NAUSEA AND VOMITING THAT IS NOT CONTROLLED WITH YOUR NAUSEA MEDICATION *UNUSUAL SHORTNESS OF BREATH *UNUSUAL BRUISING OR BLEEDING *URINARY PROBLEMS (pain or burning when urinating, or frequent urination) *BOWEL PROBLEMS (unusual diarrhea, constipation, pain near the anus) TENDERNESS IN MOUTH AND THROAT WITH OR WITHOUT PRESENCE OF ULCERS (sore throat, sores in mouth, or a toothache) UNUSUAL RASH, SWELLING OR PAIN  UNUSUAL VAGINAL DISCHARGE OR ITCHING   Items with * indicate a potential emergency and should be followed up as soon as possible or go to the Emergency Department if any problems should occur.  Please show the CHEMOTHERAPY ALERT CARD or IMMUNOTHERAPY  ALERT CARD at check-in to the Emergency Department and triage nurse.  Should you have questions after your visit or need to cancel or reschedule your appointment, please contact [facility]  Dept: [phone]  and follow the prompts.  Office hours are 8:00 a.m. to 4:30 p.m. Monday - Friday. Please note that voicemails left after 4:00 p.m. may not be returned until the following business day.  We are closed weekends and major holidays. You have access to a nurse at all times for urgent questions. Please call the main number to the clinic Dept: [phone] and follow the prompts.   For any non-urgent questions, you may also contact your provider using MyChart. We now offer e-Visits for anyone 48 and older to request care online for non-urgent symptoms. For details visit [email or web address].   Also download the MyChart app! Go to the app store, search "MyChart", open the app, select [place], and log in with your MyChart username and password.

## 2023-01-15 LAB — KAPPA/LAMBDA LIGHT CHAINS
Kappa free light chain: 18 mg/L (ref 3.3–19.4)
Kappa, lambda light chain ratio: 0.14 — ABNORMAL LOW (ref 0.26–1.65)
Lambda free light chains: 131.1 mg/L — ABNORMAL HIGH (ref 5.7–26.3)

## 2023-01-26 LAB — MULTIPLE MYELOMA PANEL, SERUM
Albumin SerPl Elph-Mcnc: 3.8 g/dL (ref 2.9–4.4)
Albumin/Glob SerPl: 1.5 (ref 0.7–1.7)
Alpha 1: 0.2 g/dL (ref 0.0–0.4)
Alpha2 Glob SerPl Elph-Mcnc: 0.7 g/dL (ref 0.4–1.0)
B-Globulin SerPl Elph-Mcnc: 1 g/dL (ref 0.7–1.3)
Gamma Glob SerPl Elph-Mcnc: 0.8 g/dL (ref 0.4–1.8)
Globulin, Total: 2.7 g/dL (ref 2.2–3.9)
IgA: 85 mg/dL — ABNORMAL LOW (ref 90–386)
IgG (Immunoglobin G), Serum: 977 mg/dL (ref 603–1613)
IgM (Immunoglobulin M), Srm: 36 mg/dL (ref 20–172)
Total Protein ELP: 6.5 g/dL (ref 6.0–8.5)

## 2023-01-27 ENCOUNTER — Other Ambulatory Visit: Payer: Self-pay | Admitting: Hematology and Oncology

## 2023-01-27 DIAGNOSIS — C9002 Multiple myeloma in relapse: Secondary | ICD-10-CM

## 2023-01-30 ENCOUNTER — Telehealth: Payer: Self-pay

## 2023-01-30 ENCOUNTER — Other Ambulatory Visit: Payer: Self-pay

## 2023-01-30 MED ORDER — LENALIDOMIDE 25 MG PO CAPS: ORAL_CAPSULE | ORAL | 0 refills | Status: DC

## 2023-01-30 NOTE — Telephone Encounter (Signed)
 Returned his call and told him Revlimid Rx sent to [facility]. He verbalized understanding.

## 2023-01-31 ENCOUNTER — Telehealth: Payer: Self-pay

## 2023-01-31 ENCOUNTER — Other Ambulatory Visit (HOSPITAL_COMMUNITY): Payer: Self-pay

## 2023-01-31 ENCOUNTER — Other Ambulatory Visit: Payer: Self-pay

## 2023-01-31 MED ORDER — LENALIDOMIDE 25 MG PO CAPS: ORAL_CAPSULE | ORAL | 0 refills | Status: DC

## 2023-01-31 NOTE — Telephone Encounter (Signed)
 Returned his call. His preferred pharmacy has changed to [facility]. [facility] to [facility]. He is aware and appreciated the call back.

## 2023-02-04 ENCOUNTER — Inpatient Hospital Stay: Payer: No Typology Code available for payment source

## 2023-02-04 ENCOUNTER — Inpatient Hospital Stay: Payer: No Typology Code available for payment source | Attending: Internal Medicine

## 2023-02-04 VITALS — BP 166/99 | HR 80 | Temp 98.6°F | Resp 16 | Wt 266.0 lb

## 2023-02-04 DIAGNOSIS — Z79899 Other long term (current) drug therapy: Secondary | ICD-10-CM | POA: Diagnosis not present

## 2023-02-04 DIAGNOSIS — C9002 Multiple myeloma in relapse: Secondary | ICD-10-CM | POA: Insufficient documentation

## 2023-02-04 DIAGNOSIS — Z5112 Encounter for antineoplastic immunotherapy: Secondary | ICD-10-CM | POA: Diagnosis not present

## 2023-02-04 LAB — CBC WITH DIFFERENTIAL (CANCER CENTER ONLY)
Abs Immature Granulocytes: 0 10*3/uL (ref 0.00–0.07)
Basophils Absolute: 0.1 10*3/uL (ref 0.0–0.1)
Basophils Relative: 2 %
Eosinophils Absolute: 0.1 10*3/uL (ref 0.0–0.5)
Eosinophils Relative: 1 %
HCT: 37.2 % — ABNORMAL LOW (ref 39.0–52.0)
Hemoglobin: 13.1 g/dL (ref 13.0–17.0)
Immature Granulocytes: 0 %
Lymphocytes Relative: 45 %
Lymphs Abs: 1.8 10*3/uL (ref 0.7–4.0)
MCH: 32 pg (ref 26.0–34.0)
MCHC: 35.2 g/dL (ref 30.0–36.0)
MCV: 91 fL (ref 80.0–100.0)
Monocytes Absolute: 0.5 10*3/uL (ref 0.1–1.0)
Monocytes Relative: 14 %
Neutro Abs: 1.5 10*3/uL — ABNORMAL LOW (ref 1.7–7.7)
Neutrophils Relative %: 38 %
Platelet Count: 167 10*3/uL (ref 150–400)
RBC: 4.09 MIL/uL — ABNORMAL LOW (ref 4.22–5.81)
RDW: 13.1 % (ref 11.5–15.5)
WBC Count: 4 10*3/uL (ref 4.0–10.5)
nRBC: 0 % (ref 0.0–0.2)

## 2023-02-04 LAB — CMP (CANCER CENTER ONLY)
ALT: 25 U/L (ref 0–44)
AST: 21 U/L (ref 15–41)
Albumin: 4.3 g/dL (ref 3.5–5.0)
Alkaline Phosphatase: 37 U/L — ABNORMAL LOW (ref 38–126)
Anion gap: 5 (ref 5–15)
BUN: 20 mg/dL (ref 6–20)
CO2: 27 mmol/L (ref 22–32)
Calcium: 9.6 mg/dL (ref 8.9–10.3)
Chloride: 107 mmol/L (ref 98–111)
Creatinine: 1.12 mg/dL (ref 0.61–1.24)
GFR, Estimated: >60 mL/min (ref >60)
Glucose, Bld: 108 mg/dL — ABNORMAL HIGH (ref 70–99)
Potassium: 3.9 mmol/L (ref 3.5–5.1)
Sodium: 139 mmol/L (ref 135–145)
Total Bilirubin: 0.7 mg/dL (ref 0.0–1.2)
Total Protein: 6.9 g/dL (ref 6.5–8.1)

## 2023-02-04 MED ORDER — BORTEZOMIB CHEMO SQ INJECTION 3.5 MG (2.5MG/ML)
0.9750 mg/m2 | Freq: Once | INTRAMUSCULAR | Status: AC
  Administered 2023-02-04: 2.5 mg via SUBCUTANEOUS
  Filled 2023-02-04: qty 1

## 2023-02-04 NOTE — Progress Notes (Signed)
 Patient states he took dexamethasone at home today prior to arrival.

## 2023-02-04 NOTE — Patient Instructions (Signed)
 [facility] - A DEPT OF [place]  Discharge Instructions: Thank you for choosing [facility] to provide your oncology and hematology care.   If you have a lab appointment with the Cancer Center, please go directly to the Cancer Center and check in at the registration area.   Wear comfortable clothing and clothing appropriate for easy access to any Portacath or PICC line.   We strive to give you quality time with your provider. You may need to reschedule your appointment if you arrive late (15 or more minutes).  Arriving late affects you and other patients whose appointments are after yours.  Also, if you miss three or more appointments without notifying the office, you may be dismissed from the clinic at the provider's discretion.      For prescription refill requests, have your pharmacy contact our office and allow 72 hours for refills to be completed.    Today you received the following chemotherapy and/or immunotherapy agents: Velcade     To help prevent nausea and vomiting after your treatment, we encourage you to take your nausea medication as directed.  BELOW ARE SYMPTOMS THAT SHOULD BE REPORTED IMMEDIATELY: *FEVER GREATER THAN 100.4 F (38 C) OR HIGHER *CHILLS OR SWEATING *NAUSEA AND VOMITING THAT IS NOT CONTROLLED WITH YOUR NAUSEA MEDICATION *UNUSUAL SHORTNESS OF BREATH *UNUSUAL BRUISING OR BLEEDING *URINARY PROBLEMS (pain or burning when urinating, or frequent urination) *BOWEL PROBLEMS (unusual diarrhea, constipation, pain near the anus) TENDERNESS IN MOUTH AND THROAT WITH OR WITHOUT PRESENCE OF ULCERS (sore throat, sores in mouth, or a toothache) UNUSUAL RASH, SWELLING OR PAIN  UNUSUAL VAGINAL DISCHARGE OR ITCHING   Items with * indicate a potential emergency and should be followed up as soon as possible or go to the Emergency Department if any problems should occur.  Please show the CHEMOTHERAPY ALERT CARD or IMMUNOTHERAPY  ALERT CARD at check-in to the Emergency Department and triage nurse.  Should you have questions after your visit or need to cancel or reschedule your appointment, please contact [facility]  Dept: [phone]  and follow the prompts.  Office hours are 8:00 a.m. to 4:30 p.m. Monday - Friday. Please note that voicemails left after 4:00 p.m. may not be returned until the following business day.  We are closed weekends and major holidays. You have access to a nurse at all times for urgent questions. Please call the main number to the clinic Dept: [phone] and follow the prompts.   For any non-urgent questions, you may also contact your provider using MyChart. We now offer e-Visits for anyone 88 and older to request care online for non-urgent symptoms. For details visit [email or web address].   Also download the MyChart app! Go to the app store, search MyChart, open the app, select [place], and log in with your MyChart username and password.

## 2023-02-14 NOTE — Assessment & Plan Note (Signed)
 I reviewed recent myeloma panel with the patient which show stable disease control Overall, he tolerated treatment well except for  weight gain We will continue treatment as scheduled Due to inability to get dental clearance, he has not received Zometa

## 2023-02-18 ENCOUNTER — Inpatient Hospital Stay: Payer: No Typology Code available for payment source

## 2023-02-18 ENCOUNTER — Inpatient Hospital Stay: Payer: No Typology Code available for payment source | Admitting: Hematology and Oncology

## 2023-02-18 VITALS — BP 136/102 | HR 81 | Temp 98.5°F | Resp 18 | Ht 74.0 in | Wt 270.6 lb

## 2023-02-18 VITALS — BP 146/92

## 2023-02-18 DIAGNOSIS — C9002 Multiple myeloma in relapse: Secondary | ICD-10-CM

## 2023-02-18 DIAGNOSIS — E66811 Obesity, class 1: Secondary | ICD-10-CM | POA: Diagnosis not present

## 2023-02-18 DIAGNOSIS — R030 Elevated blood-pressure reading, without diagnosis of hypertension: Secondary | ICD-10-CM

## 2023-02-18 DIAGNOSIS — Z6830 Body mass index (BMI) 30.0-30.9, adult: Secondary | ICD-10-CM | POA: Diagnosis not present

## 2023-02-18 DIAGNOSIS — E6609 Other obesity due to excess calories: Secondary | ICD-10-CM

## 2023-02-18 DIAGNOSIS — Z5112 Encounter for antineoplastic immunotherapy: Secondary | ICD-10-CM | POA: Diagnosis not present

## 2023-02-18 LAB — CBC WITH DIFFERENTIAL (CANCER CENTER ONLY)
Abs Immature Granulocytes: 0 10*3/uL (ref 0.00–0.07)
Basophils Absolute: 0 10*3/uL (ref 0.0–0.1)
Basophils Relative: 1 %
Eosinophils Absolute: 0.1 10*3/uL (ref 0.0–0.5)
Eosinophils Relative: 3 %
HCT: 38.6 % — ABNORMAL LOW (ref 39.0–52.0)
Hemoglobin: 13.4 g/dL (ref 13.0–17.0)
Immature Granulocytes: 0 %
Lymphocytes Relative: 25 %
Lymphs Abs: 1 10*3/uL (ref 0.7–4.0)
MCH: 32.3 pg (ref 26.0–34.0)
MCHC: 34.7 g/dL (ref 30.0–36.0)
MCV: 93 fL (ref 80.0–100.0)
Monocytes Absolute: 0.5 10*3/uL (ref 0.1–1.0)
Monocytes Relative: 13 %
Neutro Abs: 2.3 10*3/uL (ref 1.7–7.7)
Neutrophils Relative %: 58 %
Platelet Count: 150 10*3/uL (ref 150–400)
RBC: 4.15 MIL/uL — ABNORMAL LOW (ref 4.22–5.81)
RDW: 13 % (ref 11.5–15.5)
WBC Count: 4 10*3/uL (ref 4.0–10.5)
nRBC: 0 % (ref 0.0–0.2)

## 2023-02-18 LAB — CMP (CANCER CENTER ONLY)
ALT: 23 U/L (ref 0–44)
AST: 16 U/L (ref 15–41)
Albumin: 4.2 g/dL (ref 3.5–5.0)
Alkaline Phosphatase: 39 U/L (ref 38–126)
Anion gap: 5 (ref 5–15)
BUN: 19 mg/dL (ref 6–20)
CO2: 30 mmol/L (ref 22–32)
Calcium: 9.7 mg/dL (ref 8.9–10.3)
Chloride: 103 mmol/L (ref 98–111)
Creatinine: 1.36 mg/dL — ABNORMAL HIGH (ref 0.61–1.24)
GFR, Estimated: >60 mL/min (ref >60)
Glucose, Bld: 117 mg/dL — ABNORMAL HIGH (ref 70–99)
Potassium: 3.7 mmol/L (ref 3.5–5.1)
Sodium: 138 mmol/L (ref 135–145)
Total Bilirubin: 0.6 mg/dL (ref 0.0–1.2)
Total Protein: 6.7 g/dL (ref 6.5–8.1)

## 2023-02-18 MED ORDER — DEXAMETHASONE 4 MG PO TABS: 4.0000 mg | ORAL_TABLET | ORAL | Status: DC

## 2023-02-18 MED ORDER — BORTEZOMIB CHEMO SQ INJECTION 3.5 MG (2.5MG/ML)
0.9750 mg/m2 | Freq: Once | INTRAMUSCULAR | Status: AC
Start: 2023-02-18 — End: 2023-02-18
  Administered 2023-02-18: 2.5 mg via SUBCUTANEOUS
  Filled 2023-02-18: qty 1

## 2023-02-18 NOTE — Patient Instructions (Signed)
 [facility]  Discharge Instructions: Thank you for choosing [facility] to provide your oncology and hematology care.   If you have a lab appointment with the Cancer Center, please go directly to the Cancer Center and check in at the registration area.   Wear comfortable clothing and clothing appropriate for easy access to any Portacath or PICC line.   We strive to give you quality time with your provider. You may need to reschedule your appointment if you arrive late (15 or more minutes).  Arriving late affects you and other patients whose appointments are after yours.  Also, if you miss three or more appointments without notifying the office, you may be dismissed from the clinic at the provider's discretion.      For prescription refill requests, have your pharmacy contact our office and allow 72 hours for refills to be completed.    Today you received the following chemotherapy and/or immunotherapy agents velcade      To help prevent nausea and vomiting after your treatment, we encourage you to take your nausea medication as directed.  BELOW ARE SYMPTOMS THAT SHOULD BE REPORTED IMMEDIATELY: *FEVER GREATER THAN 100.4 F (38 C) OR HIGHER *CHILLS OR SWEATING *NAUSEA AND VOMITING THAT IS NOT CONTROLLED WITH YOUR NAUSEA MEDICATION *UNUSUAL SHORTNESS OF BREATH *UNUSUAL BRUISING OR BLEEDING *URINARY PROBLEMS (pain or burning when urinating, or frequent urination) *BOWEL PROBLEMS (unusual diarrhea, constipation, pain near the anus) TENDERNESS IN MOUTH AND THROAT WITH OR WITHOUT PRESENCE OF ULCERS (sore throat, sores in mouth, or a toothache) UNUSUAL RASH, SWELLING OR PAIN  UNUSUAL VAGINAL DISCHARGE OR ITCHING   Items with * indicate a potential emergency and should be followed up as soon as possible or go to the Emergency Department if any problems should occur.  Please show the CHEMOTHERAPY ALERT CARD or IMMUNOTHERAPY  ALERT CARD at check-in to the Emergency Department and triage nurse.  Should you have questions after your visit or need to cancel or reschedule your appointment, please contact [facility]  Dept: [phone]  and follow the prompts.  Office hours are 8:00 a.m. to 4:30 p.m. Monday - Friday. Please note that voicemails left after 4:00 p.m. may not be returned until the following business day.  We are closed weekends and major holidays. You have access to a nurse at all times for urgent questions. Please call the main number to the clinic Dept: [phone] and follow the prompts.   For any non-urgent questions, you may also contact your provider using MyChart. We now offer e-Visits for anyone 64 and older to request care online for non-urgent symptoms. For details visit [email or web address].   Also download the MyChart app! Go to the app store, search "MyChart", open the app, select [place], and log in with your MyChart username and password.

## 2023-02-19 ENCOUNTER — Encounter: Payer: Self-pay | Admitting: Hematology and Oncology

## 2023-02-19 LAB — KAPPA/LAMBDA LIGHT CHAINS
Kappa free light chain: 21.4 mg/L — ABNORMAL HIGH (ref 3.3–19.4)
Kappa, lambda light chain ratio: 0.16 — ABNORMAL LOW (ref 0.26–1.65)
Lambda free light chains: 136.3 mg/L — ABNORMAL HIGH (ref 5.7–26.3)

## 2023-02-19 NOTE — Assessment & Plan Note (Signed)
His blood pressure is high I advocate the patient to start monitoring his blood pressure at home He might need to be started on antihypertensives He is not symptomatic from hypertension and we will proceed with treatment without delay

## 2023-02-19 NOTE — Progress Notes (Signed)
[facility] OFFICE PROGRESS NOTE  Patient Care Team: Rometta [name], MD as PCP - General (Internal Medicine)  ASSESSMENT & PLAN:  Multiple myeloma in relapse [facility]) I reviewed recent myeloma panel with the patient which show stable disease control Overall, he tolerated treatment well except for  weight gain and high blood pressure We will continue treatment as scheduled Due to recent weight gain, I will reduce the dose of dexamethasone that he takes weekly Due to inability to get dental clearance, he has not received Zometa  Elevated BP without diagnosis of hypertension His blood pressure is high I advocate the patient to start monitoring his blood pressure at home He might need to be started on antihypertensives He is not symptomatic from hypertension and we will proceed with treatment without delay  Class 1 obesity due to excess calories in adult He continues to have progressive weight gain The patient blamed dexamethasone as a cause of his weight gain Plan to reduce the dexamethasone a little bit but I do not believe this is the sole reason that he gained weight We discussed importance of dietary modification  Orders Placed This Encounter  Procedures   CBC with Differential (Cancer Center Only)    Standing Status:   Future    Expected Date:   03/04/2023    Expiration Date:   03/03/2024   CMP (Cancer Center only)    Standing Status:   Future    Expected Date:   03/04/2023    Expiration Date:   03/03/2024   Multiple Myeloma Panel (SPEP&IFE w/QIG)    Standing Status:   Future    Expected Date:   03/18/2023    Expiration Date:   03/17/2024   Kappa/lambda light chains    Standing Status:   Future    Expected Date:   03/18/2023    Expiration Date:   03/17/2024   CBC with Differential (Cancer Center Only)    Standing Status:   Future    Expected Date:   03/18/2023    Expiration Date:   03/17/2024   CMP (Cancer Center only)    Standing Status:   Future    Expected Date:    03/18/2023    Expiration Date:   03/17/2024    All questions were answered. The patient knows to call the clinic with any problems, questions or concerns. The total time spent in the appointment was 30 minutes encounter with patients including review of chart and various tests results, discussions about plan of care and coordination of care plan   Artis Delay, MD 02/19/2023 11:08 AM  INTERVAL HISTORY: Please see below for problem oriented charting. he returns for chemo follow-up He is compliant taking his medications as directed He is not symptomatic from hypertension Denies progression of peripheral neuropathy He has appointment to return to [facility] at the end of the month for further discussion about plan of care  REVIEW OF SYSTEMS:   Constitutional: Denies fevers, chills or abnormal weight loss Eyes: Denies blurriness of vision Ears, nose, mouth, throat, and face: Denies mucositis or sore throat Respiratory: Denies cough, dyspnea or wheezes Cardiovascular: Denies palpitation, chest discomfort or lower extremity swelling Gastrointestinal:  Denies nausea, heartburn or change in bowel habits Skin: Denies abnormal skin rashes Lymphatics: Denies new lymphadenopathy or easy bruising Neurological:Denies numbness, tingling or new weaknesses Behavioral/Psych: Mood is stable, no new changes  All other systems were reviewed with the patient and are negative.  I have reviewed the past medical history,  past surgical history, social history and family history with the patient and they are unchanged from previous note.  ALLERGIES:  is allergic to daratumumab and heparin.  MEDICATIONS:  Current Outpatient Medications  Medication Sig Dispense Refill   acyclovir (ZOVIRAX) 400 MG tablet TAKE 1 TABLET(400 MG) BY MOUTH TWICE DAILY 60 tablet 11   aspirin EC 81 MG tablet Take 81 mg by mouth daily. Swallow whole.     calcium carbonate (TUMS - DOSED IN MG ELEMENTAL CALCIUM) 500 MG chewable tablet  Chew 1 tablet by mouth 3 (three) times daily.     cholecalciferol (VITAMIN D3) 25 MCG (1000 UT) tablet Take 1,000 Units by mouth daily.     dexamethasone (DECADRON) 4 MG tablet Take 1 tablet (4 mg total) by mouth once a week.     lenalidomide (REVLIMID) 25 MG capsule Take 1 capsule daily for 21 days, then stop 7 days, for cycle of every 28 days 21 capsule 0   ondansetron (ZOFRAN) 8 MG tablet Take 1 tablet (8 mg total) by mouth every 8 (eight) hours as needed for nausea or vomiting. 30 tablet 1   pantoprazole (PROTONIX) 40 MG tablet Take 1 tablet (40 mg total) by mouth daily. 60 tablet 1   prochlorperazine (COMPAZINE) 10 MG tablet Take 1 tablet (10 mg total) by mouth every 6 (six) hours as needed for nausea or vomiting. 30 tablet 1   No current facility-administered medications for this visit.    SUMMARY OF ONCOLOGIC HISTORY: Oncology History  Multiple myeloma in relapse [facility])  06/23/2015 - 06/28/2015 Hospital Admission   The patient was admitted to the hospital due to gait ataxia and back pain. He was subsequently found to have cord compression underwent surgery and was discharged home   06/24/2015 Imaging   Abnormal appearance of the T6 vertebral body, highly suspicious for possible osseous metastasis. Associated pathologic fracture withup to 30% height loss. There is associated abnormal soft tissue density within the ventral epidural space,   06/24/2015 Imaging   MRI lumbar: Focal osseous lesion with abnormal enhancement involving the right pedicle of L3, suspicious for possible osseous metastasisgiven the findings in the thoracic spine. Question additional focal lesion within the right iliac wing as above.     06/25/2015 Pathology Results   Accession: [id number] bone biopsy come from plasma cell neoplasm.   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 Zometa is not given due to inability to get dental clearance   12/14/2015 - 04/24/2016 Chemotherapy   He is started on maintenance treatment with Revlimid only   12/18/2015 Imaging   MRI thoracic and lumbar spine showed numerous enhancing foci throughout the thoracic and lumbar spine with several new small foci in the lumbar spine in comparison with prior MRI compatible with metastatic disease. Stable loss of height of the T3, T4, and T6 vertebral bodies and new postsurgical changes related to T6 laminectomy. No significant epidural disease or evidence for cord compression. No abnormal enhancement of the spinal cord or cauda equina.   05/02/2016 Bone Marrow Biopsy   Outside bone marrow biopsy showed 20% myeloma involvement   05/16/2016 Procedure   Successful placement of a right internal jugular approach power injectable Port-A-Cath. The catheter is ready for immediate  use.   05/21/2016 - 07/03/2016 Chemotherapy   He received Kyprolis, Cytoxan and dexamethasone    07/08/2016 Procedure   Status post CT-guided bone marrow biopsy, with tissue specimen sent to pathology for complete histopathologic analysis   07/08/2016 Bone Marrow Biopsy   Bone Marrow, Aspirate,Biopsy, and Clot BONE MARROW: - MILDLY HYPERCELLULAR MARROW (60%) WITH PLASMA CELL NEOPLASM - SEE COMMENT PERIPHERAL BLOOD: - NORMOCYTIC ANEMIA Diagnosis Note The marrow is hypercellular with lambda-restricted plasma cells consistent with persistence of the patient's previously diagnosed plasma cell neoplasm. The plasma cells comprise approximately 10-15% of the total marrow cellularity, are enlarged, and arranged in clusters.   08/14/2016 Miscellaneous   He received conditioning treatment with melphalan   08/15/2016 Bone Marrow Transplant   He received autologous stem cell transplant    08/24/2016 - 08/29/2016 Hospital Admission   His post-transplant course was complicated by E-Coli bacteremia   11/26/2016 PET scan   PET CT at [facility] 1. Technically limited study due to soft tissue uptake. 2. New hypermetabolic uptake at C7 spinous process and left proximal femur that is of questionable significance in absence of underlying CT correlate. Further assessment with whole body bone scan may be considered. 2. Redemonstrated nonhypermetabolic multifocal lucent and sclerotic lesions throughout the spine which are similar to prior.    12/02/2016 Bone Marrow Biopsy   He had repeat bone marrow biopsy at [facility] An immunohistochemical stain for CD138 is performed on the bone marrow core biopsy demonstrates increased plasma cells with focal clustering (10-20% overall), which are monotypic for lambda light chain by in situ hybridization.   01/06/2017 - 06/09/2017 Chemotherapy   He received weekly Dexamethasone, Pomalyst days 1-21 and Daratumumab. From 06/08/17 onwards, he is placed on maintenance Pomalyst only.  He self discontinue Pomalyst in February 2021   07/23/2017 Procedure   Successful right IJ vein Port-A-Cath explant.   03/22/2021 Imaging   1. New large disc protrusion at L4-5 with severe spinal stenosis. 2. History of multiple myeloma with largely resolved diffuse bone marrow heterogeneity since 2017. New enhancing lesions in the L4 and L5 vertebral bodies without acute fracture. 3. Chronic thoracic compression fractures including severe T6 vertebral body height loss which has progressed from 2017. Resolved epidural tumor.     11/26/2021 PET scan   1. Hypermetabolic patchy lytic L5 vertebral lesion compatible with metabolically active multiple myeloma. 2. No additional sites of hypermetabolic skeletal or extraskeletal myeloma. 3. Mild colonic diverticulosis.   11/27/2021 - 11/27/2021 Chemotherapy   Patient is on Treatment Plan : MYELOMA Daratumumab IV + Pomalidomide + Dexamethasone  q28d x 7 cycles     11/27/2021 - 05/07/2022 Chemotherapy   Patient is on Treatment Plan : MYELOMA RELAPSED REFRACTORY Daratumumab SQ + Pomalidomide + Dexamethasone (DaraPd) q28d     05/20/2022 -  Chemotherapy   Patient is on Treatment Plan : MYELOMA  RVD SQ q21d x 4 cycles       PHYSICAL EXAMINATION: ECOG PERFORMANCE STATUS: 1 - Symptomatic but completely ambulatory  Vitals:   02/18/23 1044  BP: (!) 136/102  Pulse: 81  Resp: 18  Temp: 98.5 F (36.9 C)  SpO2: 99%   Filed Weights   02/18/23 1044  Weight: 270 lb 9.6 oz (122.7 kg)    GENERAL:alert, no distress and comfortable   LABORATORY DATA:  I have reviewed the data as listed    Component Value Date/Time   NA 138 02/18/2023 1016   NA 139 01/27/2017 0809   K 3.7 02/18/2023 1016   K  4.1 01/27/2017 0809   CL 103 02/18/2023 1016   CO2 30 02/18/2023 1016   CO2 23 01/27/2017 0809   GLUCOSE 117 (H) 02/18/2023 1016   GLUCOSE 122 01/27/2017 0809   BUN 19 02/18/2023 1016   BUN 14.3 01/27/2017 0809   CREATININE 1.36 (H) 02/18/2023 1016   CREATININE 0.9 01/27/2017 0809   CALCIUM 9.7 02/18/2023 1016   CALCIUM 9.1 01/27/2017 0809   PROT 6.7 02/18/2023 1016   PROT 6.2 (L) 01/27/2017 0809   ALBUMIN 4.2 02/18/2023 1016   ALBUMIN 3.7 01/27/2017 0809   AST 16 02/18/2023 1016   AST 10 01/27/2017 0809   ALT 23 02/18/2023 1016   ALT 18 01/27/2017 0809   ALKPHOS 39 02/18/2023 1016   ALKPHOS 42 01/27/2017 0809   BILITOT 0.6 02/18/2023 1016   BILITOT 0.72 01/27/2017 0809   GFRNONAA >60 02/18/2023 1016   GFRAA >60 10/18/2019 1017    No results found for: "SPEP", "UPEP"  Lab Results  Component Value Date   WBC 4.0 02/18/2023   NEUTROABS 2.3 02/18/2023   HGB 13.4 02/18/2023   HCT 38.6 (L) 02/18/2023   MCV 93.0 02/18/2023   PLT 150 02/18/2023      Chemistry      Component Value Date/Time   NA 138 02/18/2023 1016   NA 139 01/27/2017 0809   K 3.7 02/18/2023 1016   K 4.1 01/27/2017 0809   CL 103 02/18/2023 1016   CO2  30 02/18/2023 1016   CO2 23 01/27/2017 0809   BUN 19 02/18/2023 1016   BUN 14.3 01/27/2017 0809   CREATININE 1.36 (H) 02/18/2023 1016   CREATININE 0.9 01/27/2017 0809      Component Value Date/Time   CALCIUM 9.7 02/18/2023 1016   CALCIUM 9.1 01/27/2017 0809   ALKPHOS 39 02/18/2023 1016   ALKPHOS 42 01/27/2017 0809   AST 16 02/18/2023 1016   AST 10 01/27/2017 0809   ALT 23 02/18/2023 1016   ALT 18 01/27/2017 0809   BILITOT 0.6 02/18/2023 1016   BILITOT 0.72 01/27/2017 0809

## 2023-02-19 NOTE — Assessment & Plan Note (Signed)
He continues to have progressive weight gain The patient blamed dexamethasone as a cause of his weight gain Plan to reduce the dexamethasone a little bit but I do not believe this is the sole reason that he gained weight We discussed importance of dietary modification

## 2023-02-20 ENCOUNTER — Other Ambulatory Visit: Payer: Self-pay

## 2023-02-23 LAB — MULTIPLE MYELOMA PANEL, SERUM
Albumin SerPl Elph-Mcnc: 3.5 g/dL (ref 2.9–4.4)
Albumin/Glob SerPl: 1.3 (ref 0.7–1.7)
Alpha 1: 0.2 g/dL (ref 0.0–0.4)
Alpha2 Glob SerPl Elph-Mcnc: 0.6 g/dL (ref 0.4–1.0)
B-Globulin SerPl Elph-Mcnc: 0.9 g/dL (ref 0.7–1.3)
Gamma Glob SerPl Elph-Mcnc: 1 g/dL (ref 0.4–1.8)
Globulin, Total: 2.7 g/dL (ref 2.2–3.9)
IgA: 105 mg/dL (ref 90–386)
IgG (Immunoglobin G), Serum: 1081 mg/dL (ref 603–1613)
IgM (Immunoglobulin M), Srm: 39 mg/dL (ref 20–172)
Total Protein ELP: 6.2 g/dL (ref 6.0–8.5)

## 2023-02-25 ENCOUNTER — Other Ambulatory Visit: Payer: Self-pay | Admitting: Hematology and Oncology

## 2023-02-25 DIAGNOSIS — C9002 Multiple myeloma in relapse: Secondary | ICD-10-CM | POA: Diagnosis not present

## 2023-02-25 DIAGNOSIS — Z9484 Stem cells transplant status: Secondary | ICD-10-CM | POA: Diagnosis not present

## 2023-02-25 DIAGNOSIS — C9001 Multiple myeloma in remission: Secondary | ICD-10-CM | POA: Diagnosis not present

## 2023-02-27 ENCOUNTER — Other Ambulatory Visit: Payer: Self-pay

## 2023-02-27 MED ORDER — LENALIDOMIDE 25 MG PO CAPS: ORAL_CAPSULE | ORAL | 0 refills | Status: DC

## 2023-03-03 ENCOUNTER — Telehealth: Payer: Self-pay

## 2023-03-03 NOTE — Telephone Encounter (Signed)
Returned call and given phone # for [facility] to set up delivery of Revlimid Rx. He will call the pharmacy and call the office back for questions.

## 2023-03-04 ENCOUNTER — Other Ambulatory Visit: Payer: Self-pay | Admitting: Hematology and Oncology

## 2023-03-04 ENCOUNTER — Ambulatory Visit: Payer: No Typology Code available for payment source

## 2023-03-04 ENCOUNTER — Other Ambulatory Visit: Payer: No Typology Code available for payment source

## 2023-03-04 ENCOUNTER — Telehealth: Payer: Self-pay

## 2023-03-04 NOTE — Telephone Encounter (Signed)
Attempted to call him back. No answer and voicemail is full. Unable to leave a message.

## 2023-03-04 NOTE — Telephone Encounter (Signed)
Called regarding being late for appts. He is on the side of the road waiting on tow truck. He is canceling appts for today. Appts canceled.  He is wanting to reschedule appts.

## 2023-03-04 NOTE — Telephone Encounter (Signed)
Called back and given appt times for tomorrow. He is aware of appts.

## 2023-03-05 ENCOUNTER — Inpatient Hospital Stay: Payer: No Typology Code available for payment source | Attending: Internal Medicine

## 2023-03-05 ENCOUNTER — Other Ambulatory Visit: Payer: Self-pay

## 2023-03-05 ENCOUNTER — Inpatient Hospital Stay: Payer: No Typology Code available for payment source

## 2023-03-05 VITALS — BP 155/90 | HR 70 | Temp 98.2°F | Resp 18 | Wt 269.1 lb

## 2023-03-05 DIAGNOSIS — Z79899 Other long term (current) drug therapy: Secondary | ICD-10-CM | POA: Insufficient documentation

## 2023-03-05 DIAGNOSIS — C9002 Multiple myeloma in relapse: Secondary | ICD-10-CM

## 2023-03-05 DIAGNOSIS — Z5112 Encounter for antineoplastic immunotherapy: Secondary | ICD-10-CM | POA: Diagnosis not present

## 2023-03-05 LAB — CMP (CANCER CENTER ONLY)
ALT: 20 U/L (ref 0–44)
AST: 21 U/L (ref 15–41)
Albumin: 4.3 g/dL (ref 3.5–5.0)
Alkaline Phosphatase: 37 U/L — ABNORMAL LOW (ref 38–126)
Anion gap: 6 (ref 5–15)
BUN: 19 mg/dL (ref 6–20)
CO2: 26 mmol/L (ref 22–32)
Calcium: 9.3 mg/dL (ref 8.9–10.3)
Chloride: 104 mmol/L (ref 98–111)
Creatinine: 1.08 mg/dL (ref 0.61–1.24)
GFR, Estimated: >60 mL/min (ref >60)
Glucose, Bld: 132 mg/dL — ABNORMAL HIGH (ref 70–99)
Potassium: 3.9 mmol/L (ref 3.5–5.1)
Sodium: 136 mmol/L (ref 135–145)
Total Bilirubin: 0.8 mg/dL (ref 0.0–1.2)
Total Protein: 6.8 g/dL (ref 6.5–8.1)

## 2023-03-05 LAB — CBC WITH DIFFERENTIAL (CANCER CENTER ONLY)
Abs Immature Granulocytes: 0.01 10*3/uL (ref 0.00–0.07)
Basophils Absolute: 0 10*3/uL (ref 0.0–0.1)
Basophils Relative: 1 %
Eosinophils Absolute: 0 10*3/uL (ref 0.0–0.5)
Eosinophils Relative: 0 %
HCT: 36.1 % — ABNORMAL LOW (ref 39.0–52.0)
Hemoglobin: 12.7 g/dL — ABNORMAL LOW (ref 13.0–17.0)
Immature Granulocytes: 0 %
Lymphocytes Relative: 27 %
Lymphs Abs: 1.2 10*3/uL (ref 0.7–4.0)
MCH: 31.9 pg (ref 26.0–34.0)
MCHC: 35.2 g/dL (ref 30.0–36.0)
MCV: 90.7 fL (ref 80.0–100.0)
Monocytes Absolute: 0.5 10*3/uL (ref 0.1–1.0)
Monocytes Relative: 11 %
Neutro Abs: 2.6 10*3/uL (ref 1.7–7.7)
Neutrophils Relative %: 61 %
Platelet Count: 186 10*3/uL (ref 150–400)
RBC: 3.98 MIL/uL — ABNORMAL LOW (ref 4.22–5.81)
RDW: 12.9 % (ref 11.5–15.5)
WBC Count: 4.2 10*3/uL (ref 4.0–10.5)
nRBC: 0 % (ref 0.0–0.2)

## 2023-03-05 MED ORDER — BORTEZOMIB CHEMO SQ INJECTION 3.5 MG (2.5MG/ML)
0.9750 mg/m2 | Freq: Once | INTRAMUSCULAR | Status: AC
  Administered 2023-03-05: 2.5 mg via SUBCUTANEOUS
  Filled 2023-03-05: qty 1

## 2023-03-05 NOTE — Patient Instructions (Addendum)
 [facility] - A DEPT OF [place]  Discharge Instructions: Thank you for choosing [facility] to provide your oncology and hematology care.   If you have a lab appointment with the Cancer Center, please go directly to the Cancer Center and check in at the registration area.   Wear comfortable clothing and clothing appropriate for easy access to any Portacath or PICC line.   We strive to give you quality time with your provider. You may need to reschedule your appointment if you arrive late (15 or more minutes).  Arriving late affects you and other patients whose appointments are after yours.  Also, if you miss three or more appointments without notifying the office, you may be dismissed from the clinic at the provider's discretion.      For prescription refill requests, have your pharmacy contact our office and allow 72 hours for refills to be completed.    Today you received the following chemotherapy and/or immunotherapy agents: Velcade     To help prevent nausea and vomiting after your treatment, we encourage you to take your nausea medication as directed.  BELOW ARE SYMPTOMS THAT SHOULD BE REPORTED IMMEDIATELY: *FEVER GREATER THAN 100.4 F (38 C) OR HIGHER *CHILLS OR SWEATING *NAUSEA AND VOMITING THAT IS NOT CONTROLLED WITH YOUR NAUSEA MEDICATION *UNUSUAL SHORTNESS OF BREATH *UNUSUAL BRUISING OR BLEEDING *URINARY PROBLEMS (pain or burning when urinating, or frequent urination) *BOWEL PROBLEMS (unusual diarrhea, constipation, pain near the anus) TENDERNESS IN MOUTH AND THROAT WITH OR WITHOUT PRESENCE OF ULCERS (sore throat, sores in mouth, or a toothache) UNUSUAL RASH, SWELLING OR PAIN  UNUSUAL VAGINAL DISCHARGE OR ITCHING   Items with * indicate a potential emergency and should be followed up as soon as possible or go to the Emergency Department if any problems should occur.  Please show the CHEMOTHERAPY ALERT CARD or IMMUNOTHERAPY  ALERT CARD at check-in to the Emergency Department and triage nurse.  Should you have questions after your visit or need to cancel or reschedule your appointment, please contact [facility] - A DEPT OF [facility]  Dept: [phone]  and follow the prompts.  Office hours are 8:00 a.m. to 4:30 p.m. Monday - Friday. Please note that voicemails left after 4:00 p.m. may not be returned until the following business day.  We are closed weekends and major holidays. You have access to a nurse at all times for urgent questions. Please call the main number to the clinic Dept: [phone] and follow the prompts.   For any non-urgent questions, you may also contact your provider using MyChart. We now offer e-Visits for anyone 88 and older to request care online for non-urgent symptoms. For details visit [email or web address].   Also download the MyChart app! Go to the app store, search MyChart, open the app, select [place], and log in with your MyChart username and password.

## 2023-03-05 NOTE — Progress Notes (Signed)
 Pt states he took steroids prior to arriving for injection.

## 2023-03-18 ENCOUNTER — Encounter: Payer: Self-pay | Admitting: Hematology and Oncology

## 2023-03-18 ENCOUNTER — Inpatient Hospital Stay: Payer: No Typology Code available for payment source

## 2023-03-18 ENCOUNTER — Other Ambulatory Visit: Payer: Self-pay | Admitting: Hematology and Oncology

## 2023-03-18 ENCOUNTER — Inpatient Hospital Stay (HOSPITAL_BASED_OUTPATIENT_CLINIC_OR_DEPARTMENT_OTHER): Payer: No Typology Code available for payment source | Admitting: Hematology and Oncology

## 2023-03-18 VITALS — BP 155/85 | HR 75 | Temp 98.2°F | Resp 18 | Ht 74.0 in | Wt 270.8 lb

## 2023-03-18 DIAGNOSIS — C9002 Multiple myeloma in relapse: Secondary | ICD-10-CM

## 2023-03-18 DIAGNOSIS — D61818 Other pancytopenia: Secondary | ICD-10-CM | POA: Diagnosis not present

## 2023-03-18 DIAGNOSIS — Z5112 Encounter for antineoplastic immunotherapy: Secondary | ICD-10-CM | POA: Diagnosis not present

## 2023-03-18 DIAGNOSIS — R030 Elevated blood-pressure reading, without diagnosis of hypertension: Secondary | ICD-10-CM

## 2023-03-18 LAB — CBC WITH DIFFERENTIAL (CANCER CENTER ONLY)
Abs Immature Granulocytes: 0 10*3/uL (ref 0.00–0.07)
Basophils Absolute: 0.1 10*3/uL (ref 0.0–0.1)
Basophils Relative: 2 %
Eosinophils Absolute: 0.1 10*3/uL (ref 0.0–0.5)
Eosinophils Relative: 3 %
HCT: 37.4 % — ABNORMAL LOW (ref 39.0–52.0)
Hemoglobin: 12.8 g/dL — ABNORMAL LOW (ref 13.0–17.0)
Immature Granulocytes: 0 %
Lymphocytes Relative: 31 %
Lymphs Abs: 1.2 10*3/uL (ref 0.7–4.0)
MCH: 31.8 pg (ref 26.0–34.0)
MCHC: 34.2 g/dL (ref 30.0–36.0)
MCV: 92.8 fL (ref 80.0–100.0)
Monocytes Absolute: 0.7 10*3/uL (ref 0.1–1.0)
Monocytes Relative: 16 %
Neutro Abs: 1.9 10*3/uL (ref 1.7–7.7)
Neutrophils Relative %: 48 %
Platelet Count: 147 10*3/uL — ABNORMAL LOW (ref 150–400)
RBC: 4.03 MIL/uL — ABNORMAL LOW (ref 4.22–5.81)
RDW: 12.9 % (ref 11.5–15.5)
WBC Count: 4 10*3/uL (ref 4.0–10.5)
nRBC: 0 % (ref 0.0–0.2)

## 2023-03-18 LAB — CMP (CANCER CENTER ONLY)
ALT: 24 U/L (ref 0–44)
AST: 17 U/L (ref 15–41)
Albumin: 4 g/dL (ref 3.5–5.0)
Alkaline Phosphatase: 44 U/L (ref 38–126)
Anion gap: 5 (ref 5–15)
BUN: 21 mg/dL — ABNORMAL HIGH (ref 6–20)
CO2: 28 mmol/L (ref 22–32)
Calcium: 9.3 mg/dL (ref 8.9–10.3)
Chloride: 105 mmol/L (ref 98–111)
Creatinine: 1.27 mg/dL — ABNORMAL HIGH (ref 0.61–1.24)
GFR, Estimated: >60 mL/min (ref >60)
Glucose, Bld: 98 mg/dL (ref 70–99)
Potassium: 3.8 mmol/L (ref 3.5–5.1)
Sodium: 138 mmol/L (ref 135–145)
Total Bilirubin: 0.5 mg/dL (ref 0.0–1.2)
Total Protein: 6.2 g/dL — ABNORMAL LOW (ref 6.5–8.1)

## 2023-03-18 MED ORDER — LOSARTAN POTASSIUM 50 MG PO TABS: 50.0000 mg | ORAL_TABLET | Freq: Every day | ORAL | 1 refills | Status: DC

## 2023-03-18 MED ORDER — BORTEZOMIB CHEMO SQ INJECTION 3.5 MG (2.5MG/ML)
0.9750 mg/m2 | Freq: Once | INTRAMUSCULAR | Status: AC
  Administered 2023-03-18: 2.5 mg via SUBCUTANEOUS
  Filled 2023-03-18: qty 1

## 2023-03-18 MED ORDER — ACYCLOVIR 400 MG PO TABS: 400.0000 mg | ORAL_TABLET | Freq: Every day | ORAL | 11 refills | Status: DC

## 2023-03-18 NOTE — Assessment & Plan Note (Signed)
He has intermittent mild pancytopenia due to treatment We will proceed with treatment without delay

## 2023-03-18 NOTE — Patient Instructions (Signed)
 [facility]  Discharge Instructions: Thank you for choosing [name] to provide your oncology and hematology care.   If you have a lab appointment with the Cancer Center, please go directly to the Cancer Center and check in at the registration area.   Wear comfortable clothing and clothing appropriate for easy access to any Portacath or PICC line.   We strive to give you quality time with your provider. You may need to reschedule your appointment if you arrive late (15 or more minutes).  Arriving late affects you and other patients whose appointments are after yours.  Also, if you miss three or more appointments without notifying the office, you may be dismissed from the clinic at the provider's discretion.      For prescription refill requests, have your pharmacy contact our office and allow 72 hours for refills to be completed.    Today you received the following chemotherapy and/or immunotherapy agents: Velcade    To help prevent nausea and vomiting after your treatment, we encourage you to take your nausea medication as directed.  BELOW ARE SYMPTOMS THAT SHOULD BE REPORTED IMMEDIATELY: *FEVER GREATER THAN 100.4 F (38 C) OR HIGHER *CHILLS OR SWEATING *NAUSEA AND VOMITING THAT IS NOT CONTROLLED WITH YOUR NAUSEA MEDICATION *UNUSUAL SHORTNESS OF BREATH *UNUSUAL BRUISING OR BLEEDING *URINARY PROBLEMS (pain or burning when urinating, or frequent urination) *BOWEL PROBLEMS (unusual diarrhea, constipation, pain near the anus) TENDERNESS IN MOUTH AND THROAT WITH OR WITHOUT PRESENCE OF ULCERS (sore throat, sores in mouth, or a toothache) UNUSUAL RASH, SWELLING OR PAIN  UNUSUAL VAGINAL DISCHARGE OR ITCHING   Items with * indicate a potential emergency and should be followed up as soon as possible or go to the Emergency Department if any problems should occur.  Please show the CHEMOTHERAPY ALERT CARD or IMMUNOTHERAPY  ALERT CARD at check-in to the Emergency Department and triage nurse.  Should you have questions after your visit or need to cancel or reschedule your appointment, please contact [facility]  Dept: [phone]  and follow the prompts.  Office hours are 8:00 a.m. to 4:30 p.m. Monday - Friday. Please note that voicemails left after 4:00 p.m. may not be returned until the following business day.  We are closed weekends and major holidays. You have access to a nurse at all times for urgent questions. Please call the main number to the clinic Dept: [phone] and follow the prompts.   For any non-urgent questions, you may also contact your provider using MyChart. We now offer e-Visits for anyone 69 and older to request care online for non-urgent symptoms. For details visit [email or web address].   Also download the MyChart app! Go to the app store, search "MyChart", open the app, select , and log in with your MyChart username and password.

## 2023-03-18 NOTE — Progress Notes (Signed)
Patient took his steroid at 0700 this morning.

## 2023-03-18 NOTE — Assessment & Plan Note (Signed)
With his progressive weight gain, the patient has become hypertensive As above, I will taper him off dexamethasone I will start him on blood pressure medication He will continue blood pressure monitoring at home

## 2023-03-18 NOTE — Assessment & Plan Note (Signed)
I reviewed recent myeloma panel with the patient which show stable disease control Overall, he tolerated treatment well except for  weight gain and high blood pressure We will continue treatment as scheduled Previously, I have reviewed his case with his hematologist at [facility]. Currently, he agrees for him to continue on current plan of care without changing to other treatment and he is not a candidate yet for CAR-T cell Due to recent weight gain, I recommend tapering off dexamethasone completely Due to inability to get dental clearance, he has not received Zometa

## 2023-03-18 NOTE — Progress Notes (Signed)
[facility] OFFICE PROGRESS NOTE  Patient Care Team: Rometta [name], MD as PCP - General (Internal Medicine)  ASSESSMENT & PLAN:  Multiple myeloma in relapse [facility]) I reviewed recent myeloma panel with the patient which show stable disease control Overall, he tolerated treatment well except for  weight gain and high blood pressure We will continue treatment as scheduled Previously, I have reviewed his case with his hematologist at [facility]. Currently, he agrees for him to continue on current plan of care without changing to other treatment and he is not a candidate yet for CAR-T cell Due to recent weight gain, I recommend tapering off dexamethasone completely Due to inability to get dental clearance, he has not received Zometa  Pancytopenia, acquired (HCC) He has intermittent mild pancytopenia due to treatment We will proceed with treatment without delay  Elevated BP without diagnosis of hypertension With his progressive weight gain, the patient has become hypertensive As above, I will taper him off dexamethasone I will start him on blood pressure medication He will continue blood pressure monitoring at home  Orders Placed This Encounter  Procedures   CBC with Differential (Cancer Center Only)    Standing Status:   Future    Expected Date:   04/01/2023    Expiration Date:   03/31/2024   CMP (Cancer Center only)    Standing Status:   Future    Expected Date:   04/01/2023    Expiration Date:   03/31/2024   Multiple Myeloma Panel (SPEP&IFE w/QIG)    Standing Status:   Future    Expected Date:   04/15/2023    Expiration Date:   04/14/2024   Kappa/lambda light chains    Standing Status:   Future    Expected Date:   04/15/2023    Expiration Date:   04/14/2024   CBC with Differential (Cancer Center Only)    Standing Status:   Future    Expected Date:   04/15/2023    Expiration Date:   04/14/2024   CMP (Cancer Center only)    Standing Status:   Future    Expected Date:    04/15/2023    Expiration Date:   04/14/2024    All questions were answered. The patient knows to call the clinic with any problems, questions or concerns. The total time spent in the appointment was 30 minutes encounter with patients including review of chart and various tests results, discussions about plan of care and coordination of care plan   Artis Delay, MD 03/18/2023 8:48 AM  INTERVAL HISTORY: Please see below for problem oriented charting. he returns for chemo follow-up Since last time I saw him, he has intermittent headaches He has chronic hypertension from blood pressure monitoring at home He continues to gain weight He is not able to get dental clearance We discussed test results and future plan of care  REVIEW OF SYSTEMS:   Constitutional: Denies fevers, chills or abnormal weight loss Eyes: Denies blurriness of vision Ears, nose, mouth, throat, and face: Denies mucositis or sore throat Respiratory: Denies cough, dyspnea or wheezes Cardiovascular: Denies palpitation, chest discomfort or lower extremity swelling Gastrointestinal:  Denies nausea, heartburn or change in bowel habits Skin: Denies abnormal skin rashes Lymphatics: Denies new lymphadenopathy or easy bruising Neurological:Denies numbness, tingling or new weaknesses Behavioral/Psych: Mood is stable, no new changes  All other systems were reviewed with the patient and are negative.  I have reviewed the past medical history, past surgical history, social history and  family history with the patient and they are unchanged from previous note.  ALLERGIES:  is allergic to daratumumab and heparin.  MEDICATIONS:  Current Outpatient Medications  Medication Sig Dispense Refill   losartan (COZAAR) 50 MG tablet Take 1 tablet (50 mg total) by mouth daily. 30 tablet 1   acyclovir (ZOVIRAX) 400 MG tablet Take 1 tablet (400 mg total) by mouth daily. 30 tablet 11   aspirin EC 81 MG tablet Take 81 mg by mouth daily. Swallow  whole.     calcium carbonate (TUMS - DOSED IN MG ELEMENTAL CALCIUM) 500 MG chewable tablet Chew 1 tablet by mouth 3 (three) times daily.     cholecalciferol (VITAMIN D3) 25 MCG (1000 UT) tablet Take 1,000 Units by mouth daily.     HYDROcodone-acetaminophen (NORCO) 10-325 MG tablet Take 1 tablet by mouth every 4 (four) hours as needed.     lenalidomide (REVLIMID) 25 MG capsule TAKE 1 CAPSULE BY MOUTH 1 TIME A DAY FOR 21 DAYS ON THEN 7 DAYS OFF, REPEAT EVERY 28 DAYS 21 capsule 0   ondansetron (ZOFRAN) 8 MG tablet Take 1 tablet (8 mg total) by mouth every 8 (eight) hours as needed for nausea or vomiting. 30 tablet 1   pantoprazole (PROTONIX) 40 MG tablet Take 1 tablet (40 mg total) by mouth daily. 60 tablet 1   penicillin v potassium (VEETID) 500 MG tablet Take 500 mg by mouth every 6 (six) hours.     prochlorperazine (COMPAZINE) 10 MG tablet Take 1 tablet (10 mg total) by mouth every 6 (six) hours as needed for nausea or vomiting. 30 tablet 1   No current facility-administered medications for this visit.    SUMMARY OF ONCOLOGIC HISTORY: Oncology History  Multiple myeloma in relapse [facility])  06/23/2015 - 06/28/2015 Hospital Admission   The patient was admitted to the hospital due to gait ataxia and back pain. He was subsequently found to have cord compression underwent surgery and was discharged home   06/24/2015 Imaging   Abnormal appearance of the T6 vertebral body, highly suspicious for possible osseous metastasis. Associated pathologic fracture withup to 30% height loss. There is associated abnormal soft tissue density within the ventral epidural space,   06/24/2015 Imaging   MRI lumbar: Focal osseous lesion with abnormal enhancement involving the right pedicle of L3, suspicious for possible osseous metastasisgiven the findings in the thoracic spine. Question additional focal lesion within the right iliac wing as above.     06/25/2015 Pathology Results   Accession: [id number] bone biopsy come  from plasma cell neoplasm.   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 Zometa is not given due to inability to get dental clearance   12/14/2015 - 04/24/2016 Chemotherapy   He is started on maintenance treatment with Revlimid only   12/18/2015 Imaging   MRI thoracic and lumbar spine showed numerous enhancing foci throughout the thoracic and lumbar spine with several new small foci in the lumbar spine in comparison with prior MRI compatible with metastatic disease. Stable loss of height of the T3, T4, and T6 vertebral bodies and new postsurgical changes related to T6 laminectomy. No significant epidural disease or evidence for cord compression. No abnormal enhancement of the spinal cord or cauda equina.  05/02/2016 Bone Marrow Biopsy   Outside bone marrow biopsy showed 20% myeloma involvement   05/16/2016 Procedure   Successful placement of a right internal jugular approach power injectable Port-A-Cath. The catheter is ready for immediate use.   05/21/2016 - 07/03/2016 Chemotherapy   He received Kyprolis, Cytoxan and dexamethasone    07/08/2016 Procedure   Status post CT-guided bone marrow biopsy, with tissue specimen sent to pathology for complete histopathologic analysis   07/08/2016 Bone Marrow Biopsy   Bone Marrow, Aspirate,Biopsy, and Clot BONE MARROW: - MILDLY HYPERCELLULAR MARROW (60%) WITH PLASMA CELL NEOPLASM - SEE COMMENT PERIPHERAL BLOOD: - NORMOCYTIC ANEMIA Diagnosis Note The marrow is hypercellular with lambda-restricted plasma cells consistent with persistence of the patient's previously diagnosed plasma cell neoplasm. The plasma cells comprise approximately 10-15% of the total marrow  cellularity, are enlarged, and arranged in clusters.   08/14/2016 Miscellaneous   He received conditioning treatment with melphalan   08/15/2016 Bone Marrow Transplant   He received autologous stem cell transplant   08/24/2016 - 08/29/2016 Hospital Admission   His post-transplant course was complicated by E-Coli bacteremia   11/26/2016 PET scan   PET CT at [facility] 1. Technically limited study due to soft tissue uptake. 2. New hypermetabolic uptake at C7 spinous process and left proximal femur that is of questionable significance in absence of underlying CT correlate. Further assessment with whole body bone scan may be considered. 2. Redemonstrated nonhypermetabolic multifocal lucent and sclerotic lesions throughout the spine which are similar to prior.    12/02/2016 Bone Marrow Biopsy   He had repeat bone marrow biopsy at [facility] An immunohistochemical stain for CD138 is performed on the bone marrow core biopsy demonstrates increased plasma cells with focal clustering (10-20% overall), which are monotypic for lambda light chain by in situ hybridization.   01/06/2017 - 06/09/2017 Chemotherapy   He received weekly Dexamethasone, Pomalyst days 1-21 and Daratumumab. From 06/08/17 onwards, he is placed on maintenance Pomalyst only.  He self discontinue Pomalyst in February 2021   07/23/2017 Procedure   Successful right IJ vein Port-A-Cath explant.   03/22/2021 Imaging   1. New large disc protrusion at L4-5 with severe spinal stenosis. 2. History of multiple myeloma with largely resolved diffuse bone marrow heterogeneity since 2017. New enhancing lesions in the L4 and L5 vertebral bodies without acute fracture. 3. Chronic thoracic compression fractures including severe T6 vertebral body height loss which has progressed from 2017. Resolved epidural tumor.     11/26/2021 PET scan   1. Hypermetabolic patchy lytic L5 vertebral lesion compatible with metabolically active multiple myeloma. 2. No additional  sites of hypermetabolic skeletal or extraskeletal myeloma. 3. Mild colonic diverticulosis.   11/27/2021 - 11/27/2021 Chemotherapy   Patient is on Treatment Plan : MYELOMA Daratumumab IV + Pomalidomide + Dexamethasone q28d x 7 cycles     11/27/2021 - 05/07/2022 Chemotherapy   Patient is on Treatment Plan : MYELOMA RELAPSED REFRACTORY Daratumumab SQ + Pomalidomide + Dexamethasone (DaraPd) q28d     05/20/2022 -  Chemotherapy   Patient is on Treatment Plan : MYELOMA  RVD SQ q21d x 4 cycles       PHYSICAL EXAMINATION: ECOG PERFORMANCE STATUS: 1 - Symptomatic but completely ambulatory  Vitals:   03/18/23 0833  BP: (!) 155/85  Pulse: 75  Resp: 18  Temp: 98.2 F (36.8 C)  SpO2: 100%   Filed Weights   03/18/23 0833  Weight: 270 lb 12.8 oz (122.8 kg)    GENERAL:alert, no distress and comfortable  LABORATORY  DATA:  I have reviewed the data as listed    Component Value Date/Time   NA 136 03/05/2023 0851   NA 139 01/27/2017 0809   K 3.9 03/05/2023 0851   K 4.1 01/27/2017 0809   CL 104 03/05/2023 0851   CO2 26 03/05/2023 0851   CO2 23 01/27/2017 0809   GLUCOSE 132 (H) 03/05/2023 0851   GLUCOSE 122 01/27/2017 0809   BUN 19 03/05/2023 0851   BUN 14.3 01/27/2017 0809   CREATININE 1.08 03/05/2023 0851   CREATININE 0.9 01/27/2017 0809   CALCIUM 9.3 03/05/2023 0851   CALCIUM 9.1 01/27/2017 0809   PROT 6.8 03/05/2023 0851   PROT 6.2 (L) 01/27/2017 0809   ALBUMIN 4.3 03/05/2023 0851   ALBUMIN 3.7 01/27/2017 0809   AST 21 03/05/2023 0851   AST 10 01/27/2017 0809   ALT 20 03/05/2023 0851   ALT 18 01/27/2017 0809   ALKPHOS 37 (L) 03/05/2023 0851   ALKPHOS 42 01/27/2017 0809   BILITOT 0.8 03/05/2023 0851   BILITOT 0.72 01/27/2017 0809   GFRNONAA >60 03/05/2023 0851   GFRAA >60 10/18/2019 1017    No results found for: "SPEP", "UPEP"  Lab Results  Component Value Date   WBC 4.0 03/18/2023   NEUTROABS 1.9 03/18/2023   HGB 12.8 (L) 03/18/2023   HCT 37.4 (L) 03/18/2023   MCV  92.8 03/18/2023   PLT 147 (L) 03/18/2023      Chemistry      Component Value Date/Time   NA 136 03/05/2023 0851   NA 139 01/27/2017 0809   K 3.9 03/05/2023 0851   K 4.1 01/27/2017 0809   CL 104 03/05/2023 0851   CO2 26 03/05/2023 0851   CO2 23 01/27/2017 0809   BUN 19 03/05/2023 0851   BUN 14.3 01/27/2017 0809   CREATININE 1.08 03/05/2023 0851   CREATININE 0.9 01/27/2017 0809      Component Value Date/Time   CALCIUM 9.3 03/05/2023 0851   CALCIUM 9.1 01/27/2017 0809   ALKPHOS 37 (L) 03/05/2023 0851   ALKPHOS 42 01/27/2017 0809   AST 21 03/05/2023 0851   AST 10 01/27/2017 0809   ALT 20 03/05/2023 0851   ALT 18 01/27/2017 0809   BILITOT 0.8 03/05/2023 0851   BILITOT 0.72 01/27/2017 0809

## 2023-03-19 ENCOUNTER — Other Ambulatory Visit: Payer: Self-pay

## 2023-03-19 LAB — KAPPA/LAMBDA LIGHT CHAINS
Kappa free light chain: 19.7 mg/L — ABNORMAL HIGH (ref 3.3–19.4)
Kappa, lambda light chain ratio: 0.15 — ABNORMAL LOW (ref 0.26–1.65)
Lambda free light chains: 130.5 mg/L — ABNORMAL HIGH (ref 5.7–26.3)

## 2023-03-24 ENCOUNTER — Other Ambulatory Visit: Payer: Self-pay | Admitting: Hematology and Oncology

## 2023-03-26 LAB — MULTIPLE MYELOMA PANEL, SERUM
Albumin SerPl Elph-Mcnc: 3.8 g/dL (ref 2.9–4.4)
Albumin/Glob SerPl: 1.6 (ref 0.7–1.7)
Alpha 1: 0.2 g/dL (ref 0.0–0.4)
Alpha2 Glob SerPl Elph-Mcnc: 0.6 g/dL (ref 0.4–1.0)
B-Globulin SerPl Elph-Mcnc: 0.9 g/dL (ref 0.7–1.3)
Gamma Glob SerPl Elph-Mcnc: 0.9 g/dL (ref 0.4–1.8)
Globulin, Total: 2.5 g/dL (ref 2.2–3.9)
IgA: 101 mg/dL (ref 90–386)
IgG (Immunoglobin G), Serum: 1003 mg/dL (ref 603–1613)
IgM (Immunoglobulin M), Srm: 33 mg/dL (ref 20–172)
Total Protein ELP: 6.3 g/dL (ref 6.0–8.5)

## 2023-03-28 ENCOUNTER — Telehealth: Payer: Self-pay | Admitting: Hematology and Oncology

## 2023-03-28 NOTE — Telephone Encounter (Signed)
 Rescheduled appointments per 2/28 secure chat. Patient is aware of the changes made to his upcoming appointments.

## 2023-04-01 ENCOUNTER — Inpatient Hospital Stay: Payer: No Typology Code available for payment source

## 2023-04-01 ENCOUNTER — Encounter: Payer: Self-pay | Admitting: Hematology and Oncology

## 2023-04-01 ENCOUNTER — Other Ambulatory Visit: Payer: No Typology Code available for payment source

## 2023-04-01 ENCOUNTER — Inpatient Hospital Stay: Payer: No Typology Code available for payment source | Attending: Internal Medicine

## 2023-04-01 VITALS — BP 153/107 | HR 80 | Temp 98.2°F | Resp 16 | Wt 265.8 lb

## 2023-04-01 DIAGNOSIS — Z79899 Other long term (current) drug therapy: Secondary | ICD-10-CM | POA: Diagnosis not present

## 2023-04-01 DIAGNOSIS — C9002 Multiple myeloma in relapse: Secondary | ICD-10-CM | POA: Diagnosis not present

## 2023-04-01 DIAGNOSIS — Z5112 Encounter for antineoplastic immunotherapy: Secondary | ICD-10-CM | POA: Diagnosis not present

## 2023-04-01 LAB — CMP (CANCER CENTER ONLY)
ALT: 20 U/L (ref 0–44)
AST: 17 U/L (ref 15–41)
Albumin: 4.2 g/dL (ref 3.5–5.0)
Alkaline Phosphatase: 48 U/L (ref 38–126)
Anion gap: 4 — ABNORMAL LOW (ref 5–15)
BUN: 17 mg/dL (ref 6–20)
CO2: 27 mmol/L (ref 22–32)
Calcium: 9.6 mg/dL (ref 8.9–10.3)
Chloride: 106 mmol/L (ref 98–111)
Creatinine: 1.02 mg/dL (ref 0.61–1.24)
GFR, Estimated: >60 mL/min (ref >60)
Glucose, Bld: 131 mg/dL — ABNORMAL HIGH (ref 70–99)
Potassium: 4.2 mmol/L (ref 3.5–5.1)
Sodium: 137 mmol/L (ref 135–145)
Total Bilirubin: 0.6 mg/dL (ref 0.0–1.2)
Total Protein: 7 g/dL (ref 6.5–8.1)

## 2023-04-01 LAB — CBC WITH DIFFERENTIAL (CANCER CENTER ONLY)
Abs Immature Granulocytes: 0.01 10*3/uL (ref 0.00–0.07)
Basophils Absolute: 0.1 10*3/uL (ref 0.0–0.1)
Basophils Relative: 2 %
Eosinophils Absolute: 0.2 10*3/uL (ref 0.0–0.5)
Eosinophils Relative: 3 %
HCT: 37.2 % — ABNORMAL LOW (ref 39.0–52.0)
Hemoglobin: 13 g/dL (ref 13.0–17.0)
Immature Granulocytes: 0 %
Lymphocytes Relative: 27 %
Lymphs Abs: 1.5 10*3/uL (ref 0.7–4.0)
MCH: 32 pg (ref 26.0–34.0)
MCHC: 34.9 g/dL (ref 30.0–36.0)
MCV: 91.6 fL (ref 80.0–100.0)
Monocytes Absolute: 0.6 10*3/uL (ref 0.1–1.0)
Monocytes Relative: 11 %
Neutro Abs: 3.1 10*3/uL (ref 1.7–7.7)
Neutrophils Relative %: 57 %
Platelet Count: 176 10*3/uL (ref 150–400)
RBC: 4.06 MIL/uL — ABNORMAL LOW (ref 4.22–5.81)
RDW: 13.2 % (ref 11.5–15.5)
WBC Count: 5.3 10*3/uL (ref 4.0–10.5)
nRBC: 0 % (ref 0.0–0.2)

## 2023-04-01 MED ORDER — BORTEZOMIB CHEMO SQ INJECTION 3.5 MG (2.5MG/ML)
0.9750 mg/m2 | Freq: Once | INTRAMUSCULAR | Status: AC
  Administered 2023-04-01: 2.5 mg via SUBCUTANEOUS
  Filled 2023-04-01: qty 1

## 2023-04-01 MED ORDER — ACETAMINOPHEN 325 MG PO TABS
650.0000 mg | ORAL_TABLET | Freq: Once | ORAL | Status: AC
  Administered 2023-04-01: 650 mg via ORAL
  Filled 2023-04-01: qty 2

## 2023-04-01 NOTE — Progress Notes (Signed)
 Per patient- Dr. Bertis Ruddy told him to stop taking his dexamethasone before tx.

## 2023-04-01 NOTE — Patient Instructions (Signed)
 [facility]  Discharge Instructions: Thank you for choosing [name] to provide your oncology and hematology care.   If you have a lab appointment with the Cancer Center, please go directly to the Cancer Center and check in at the registration area.   Wear comfortable clothing and clothing appropriate for easy access to any Portacath or PICC line.   We strive to give you quality time with your provider. You may need to reschedule your appointment if you arrive late (15 or more minutes).  Arriving late affects you and other patients whose appointments are after yours.  Also, if you miss three or more appointments without notifying the office, you may be dismissed from the clinic at the provider's discretion.      For prescription refill requests, have your pharmacy contact our office and allow 72 hours for refills to be completed.    Today you received the following chemotherapy and/or immunotherapy agents: Velcade    To help prevent nausea and vomiting after your treatment, we encourage you to take your nausea medication as directed.  BELOW ARE SYMPTOMS THAT SHOULD BE REPORTED IMMEDIATELY: *FEVER GREATER THAN 100.4 F (38 C) OR HIGHER *CHILLS OR SWEATING *NAUSEA AND VOMITING THAT IS NOT CONTROLLED WITH YOUR NAUSEA MEDICATION *UNUSUAL SHORTNESS OF BREATH *UNUSUAL BRUISING OR BLEEDING *URINARY PROBLEMS (pain or burning when urinating, or frequent urination) *BOWEL PROBLEMS (unusual diarrhea, constipation, pain near the anus) TENDERNESS IN MOUTH AND THROAT WITH OR WITHOUT PRESENCE OF ULCERS (sore throat, sores in mouth, or a toothache) UNUSUAL RASH, SWELLING OR PAIN  UNUSUAL VAGINAL DISCHARGE OR ITCHING   Items with * indicate a potential emergency and should be followed up as soon as possible or go to the Emergency Department if any problems should occur.  Please show the CHEMOTHERAPY ALERT CARD or IMMUNOTHERAPY  ALERT CARD at check-in to the Emergency Department and triage nurse.  Should you have questions after your visit or need to cancel or reschedule your appointment, please contact [facility]  Dept: [phone]  and follow the prompts.  Office hours are 8:00 a.m. to 4:30 p.m. Monday - Friday. Please note that voicemails left after 4:00 p.m. may not be returned until the following business day.  We are closed weekends and major holidays. You have access to a nurse at all times for urgent questions. Please call the main number to the clinic Dept: [phone] and follow the prompts.   For any non-urgent questions, you may also contact your provider using MyChart. We now offer e-Visits for anyone 69 and older to request care online for non-urgent symptoms. For details visit [email or web address].   Also download the MyChart app! Go to the app store, search "MyChart", open the app, select , and log in with your MyChart username and password.

## 2023-04-02 ENCOUNTER — Other Ambulatory Visit: Payer: Self-pay

## 2023-04-07 ENCOUNTER — Other Ambulatory Visit: Payer: Self-pay

## 2023-04-14 NOTE — Assessment & Plan Note (Signed)
 He was diagnosed with IgG kappa multiple myeloma in 2017 after presentation with lumbar fracture.   Pathology: BM biopsy showed 50% involvement; Cytogenetics 46XY, positive for 13q-  He received combination chemotherapy with Velcade, lenalidomide and dexamethasone, followed by change of treatment to carfilzomib, Cytoxan and dexamethasone.  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  He received dexamethasone, pomalidomide and daratumumab followed by maintenance pomalidomide, discontinued in 2021  He has disease relapse in October 2023 and was restarted back on treatment with daratumumab, pomalidomide and dexamethasone with partial response.  Treatment was then switched to lenalidomide, bortezomib and dexamethasone in April 2024 with VGPR  Since discontinuation of dexamethasone, he has lost weight He will continue lenalidomide and Velcade every other week The patient desired to travel back home in May We will try to make accommodations so that he can get his pills available He will continue calcium with vitamin D supplement He is not a candidate for Zometa due to inability to get dental clearance He will continue acyclovir for antimicrobial prophylaxis and aspirin for DVT prophylaxis

## 2023-04-15 ENCOUNTER — Ambulatory Visit: Payer: No Typology Code available for payment source

## 2023-04-15 ENCOUNTER — Inpatient Hospital Stay: Payer: No Typology Code available for payment source

## 2023-04-15 ENCOUNTER — Other Ambulatory Visit: Payer: No Typology Code available for payment source

## 2023-04-15 ENCOUNTER — Inpatient Hospital Stay (HOSPITAL_BASED_OUTPATIENT_CLINIC_OR_DEPARTMENT_OTHER): Payer: No Typology Code available for payment source | Admitting: Hematology and Oncology

## 2023-04-15 VITALS — BP 145/86 | HR 77 | Temp 98.1°F | Resp 18 | Ht 74.0 in | Wt 258.6 lb

## 2023-04-15 DIAGNOSIS — E6609 Other obesity due to excess calories: Secondary | ICD-10-CM | POA: Diagnosis not present

## 2023-04-15 DIAGNOSIS — Z6830 Body mass index (BMI) 30.0-30.9, adult: Secondary | ICD-10-CM

## 2023-04-15 DIAGNOSIS — C9002 Multiple myeloma in relapse: Secondary | ICD-10-CM

## 2023-04-15 DIAGNOSIS — D6481 Anemia due to antineoplastic chemotherapy: Secondary | ICD-10-CM

## 2023-04-15 DIAGNOSIS — Z5112 Encounter for antineoplastic immunotherapy: Secondary | ICD-10-CM | POA: Diagnosis not present

## 2023-04-15 DIAGNOSIS — T451X5A Adverse effect of antineoplastic and immunosuppressive drugs, initial encounter: Secondary | ICD-10-CM

## 2023-04-15 DIAGNOSIS — E66811 Obesity, class 1: Secondary | ICD-10-CM | POA: Diagnosis not present

## 2023-04-15 LAB — CBC WITH DIFFERENTIAL (CANCER CENTER ONLY)
Abs Immature Granulocytes: 0 10*3/uL (ref 0.00–0.07)
Basophils Absolute: 0 10*3/uL (ref 0.0–0.1)
Basophils Relative: 1 %
Eosinophils Absolute: 0.1 10*3/uL (ref 0.0–0.5)
Eosinophils Relative: 3 %
HCT: 37.6 % — ABNORMAL LOW (ref 39.0–52.0)
Hemoglobin: 12.9 g/dL — ABNORMAL LOW (ref 13.0–17.0)
Immature Granulocytes: 0 %
Lymphocytes Relative: 29 %
Lymphs Abs: 1.3 10*3/uL (ref 0.7–4.0)
MCH: 31.9 pg (ref 26.0–34.0)
MCHC: 34.3 g/dL (ref 30.0–36.0)
MCV: 92.8 fL (ref 80.0–100.0)
Monocytes Absolute: 0.6 10*3/uL (ref 0.1–1.0)
Monocytes Relative: 15 %
Neutro Abs: 2.2 10*3/uL (ref 1.7–7.7)
Neutrophils Relative %: 52 %
Platelet Count: 154 10*3/uL (ref 150–400)
RBC: 4.05 MIL/uL — ABNORMAL LOW (ref 4.22–5.81)
RDW: 13 % (ref 11.5–15.5)
WBC Count: 4.3 10*3/uL (ref 4.0–10.5)
nRBC: 0 % (ref 0.0–0.2)

## 2023-04-15 LAB — CMP (CANCER CENTER ONLY)
ALT: 26 U/L (ref 0–44)
AST: 18 U/L (ref 15–41)
Albumin: 4.2 g/dL (ref 3.5–5.0)
Alkaline Phosphatase: 44 U/L (ref 38–126)
Anion gap: 4 — ABNORMAL LOW (ref 5–15)
BUN: 15 mg/dL (ref 6–20)
CO2: 31 mmol/L (ref 22–32)
Calcium: 9.6 mg/dL (ref 8.9–10.3)
Chloride: 105 mmol/L (ref 98–111)
Creatinine: 1.07 mg/dL (ref 0.61–1.24)
GFR, Estimated: >60 mL/min (ref >60)
Glucose, Bld: 100 mg/dL — ABNORMAL HIGH (ref 70–99)
Potassium: 4.1 mmol/L (ref 3.5–5.1)
Sodium: 140 mmol/L (ref 135–145)
Total Bilirubin: 0.7 mg/dL (ref 0.0–1.2)
Total Protein: 7.2 g/dL (ref 6.5–8.1)

## 2023-04-15 MED ORDER — BORTEZOMIB CHEMO SQ INJECTION 3.5 MG (2.5MG/ML)
0.9750 mg/m2 | Freq: Once | INTRAMUSCULAR | Status: AC
  Administered 2023-04-15: 2.5 mg via SUBCUTANEOUS
  Filled 2023-04-15: qty 1

## 2023-04-15 NOTE — Assessment & Plan Note (Addendum)
 He was successful with weight loss since discontinuation of dexamethasone I encouraged the patient to continue his best effort to lose weight

## 2023-04-15 NOTE — Progress Notes (Signed)
 [facility] OFFICE PROGRESS NOTE  Patient Care Team: Rometta [name], MD as PCP - General (Internal Medicine)  Assessment & Plan Multiple myeloma in relapse [facility]) He was diagnosed with IgG kappa multiple myeloma in 2017 after presentation with lumbar fracture.   Pathology: BM biopsy showed 50% involvement; Cytogenetics 46XY, positive for 13q-  He received combination chemotherapy with Velcade, lenalidomide and dexamethasone, followed by change of treatment to carfilzomib, Cytoxan and dexamethasone.  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  He received dexamethasone, pomalidomide and daratumumab followed by maintenance pomalidomide, discontinued in 2021  He has disease relapse in October 2023 and was restarted back on treatment with daratumumab, pomalidomide and dexamethasone with partial response.  Treatment was then switched to lenalidomide, bortezomib and dexamethasone in April 2024 with VGPR  Since discontinuation of dexamethasone, he has lost weight He will continue lenalidomide and Velcade every other week The patient desired to travel back home in May We will try to make accommodations so that he can get his pills available He will continue calcium with vitamin D supplement He is not a candidate for Zometa due to inability to get dental clearance He will continue acyclovir for antimicrobial prophylaxis and aspirin for DVT prophylaxis Anemia due to antineoplastic chemotherapy This is likely due to recent treatment. The patient denies recent history of bleeding such as epistaxis, hematuria or hematochezia. He is asymptomatic from the anemia. I will observe for now.  He does not require transfusion now. I will continue the chemotherapy at current dose without dosage adjustment.  If the anemia gets progressive worse in the future, I might have to delay his treatment or adjust the chemotherapy dose.  Class 1 obesity due to  excess calories without serious comorbidity with body mass index (BMI) of 30.0 to 30.9 in adult He was successful with weight loss since discontinuation of dexamethasone I encouraged the patient to continue his best effort to lose weight  Orders Placed This Encounter  Procedures   CBC with Differential (Cancer Center Only)    Standing Status:   Future    Expected Date:   04/29/2023    Expiration Date:   04/28/2024   CMP (Cancer Center only)    Standing Status:   Future    Expected Date:   04/29/2023    Expiration Date:   04/28/2024   Multiple Myeloma Panel (SPEP&IFE w/QIG)    Standing Status:   Future    Expected Date:   05/13/2023    Expiration Date:   05/12/2024   Kappa/lambda light chains    Standing Status:   Future    Expected Date:   05/13/2023    Expiration Date:   05/12/2024   CBC with Differential (Cancer Center Only)    Standing Status:   Future    Expected Date:   05/13/2023    Expiration Date:   05/12/2024   CMP (Cancer Center only)    Standing Status:   Future    Expected Date:   05/13/2023    Expiration Date:   05/12/2024   CBC with Differential (Cancer Center Only)    Standing Status:   Future    Expected Date:   05/27/2023    Expiration Date:   05/26/2024   CMP (Cancer Center only)    Standing Status:   Future    Expected Date:   05/27/2023    Expiration Date:   05/26/2024   Multiple Myeloma Panel (SPEP&IFE w/QIG)  Standing Status:   Future    Expected Date:   06/10/2023    Expiration Date:   06/09/2024   Kappa/lambda light chains    Standing Status:   Future    Expected Date:   06/10/2023    Expiration Date:   06/09/2024   CBC with Differential (Cancer Center Only)    Standing Status:   Future    Expected Date:   06/10/2023    Expiration Date:   06/09/2024   CMP (Cancer Center only)    Standing Status:   Future    Expected Date:   06/10/2023    Expiration Date:   06/09/2024   CBC with Differential (Cancer Center Only)    Standing Status:   Future    Expected Date:    06/24/2023    Expiration Date:   06/23/2024   CMP (Cancer Center only)    Standing Status:   Future    Expected Date:   06/24/2023    Expiration Date:   06/23/2024   Multiple Myeloma Panel (SPEP&IFE w/QIG)    Standing Status:   Future    Expected Date:   07/08/2023    Expiration Date:   07/07/2024   Kappa/lambda light chains    Standing Status:   Future    Expected Date:   07/08/2023    Expiration Date:   07/07/2024   CBC with Differential (Cancer Center Only)    Standing Status:   Future    Expected Date:   07/08/2023    Expiration Date:   07/07/2024   CMP (Cancer Center only)    Standing Status:   Future    Expected Date:   07/08/2023    Expiration Date:   07/07/2024     Artis Delay, MD  INTERVAL HISTORY: he returns for treatment follow-up Complications related to previous cycle of chemotherapy included anemia, and weight loss, He has lost some weight through dietary modification and exercise He denies worsening peripheral neuropathy The patient desired to travel back home in May and inquired about his treatment plans  PHYSICAL EXAMINATION: ECOG PERFORMANCE STATUS: 0 - Asymptomatic  Vitals:   04/15/23 1043  BP: (!) 145/86  Pulse: 77  Resp: 18  Temp: 98.1 F (36.7 C)  SpO2: 100%   Filed Weights   04/15/23 1043  Weight: 258 lb 9.6 oz (117.3 kg)    Relevant data reviewed during this visit included CBC, CMP and recent myeloma panel

## 2023-04-15 NOTE — Assessment & Plan Note (Addendum)
This is likely due to recent treatment. The patient denies recent history of bleeding such as epistaxis, hematuria or hematochezia. He is asymptomatic from the anemia. I will observe for now.  He does not require transfusion now. I will continue the chemotherapy at current dose without dosage adjustment.  If the anemia gets progressive worse in the future, I might have to delay his treatment or adjust the chemotherapy dose.  

## 2023-04-15 NOTE — Patient Instructions (Signed)
 [facility]  Discharge Instructions: Thank you for choosing [name] to provide your oncology and hematology care.   If you have a lab appointment with the Cancer Center, please go directly to the Cancer Center and check in at the registration area.   Wear comfortable clothing and clothing appropriate for easy access to any Portacath or PICC line.   We strive to give you quality time with your provider. You may need to reschedule your appointment if you arrive late (15 or more minutes).  Arriving late affects you and other patients whose appointments are after yours.  Also, if you miss three or more appointments without notifying the office, you may be dismissed from the clinic at the provider's discretion.      For prescription refill requests, have your pharmacy contact our office and allow 72 hours for refills to be completed.    Today you received the following chemotherapy and/or immunotherapy agents: Velcade    To help prevent nausea and vomiting after your treatment, we encourage you to take your nausea medication as directed.  BELOW ARE SYMPTOMS THAT SHOULD BE REPORTED IMMEDIATELY: *FEVER GREATER THAN 100.4 F (38 C) OR HIGHER *CHILLS OR SWEATING *NAUSEA AND VOMITING THAT IS NOT CONTROLLED WITH YOUR NAUSEA MEDICATION *UNUSUAL SHORTNESS OF BREATH *UNUSUAL BRUISING OR BLEEDING *URINARY PROBLEMS (pain or burning when urinating, or frequent urination) *BOWEL PROBLEMS (unusual diarrhea, constipation, pain near the anus) TENDERNESS IN MOUTH AND THROAT WITH OR WITHOUT PRESENCE OF ULCERS (sore throat, sores in mouth, or a toothache) UNUSUAL RASH, SWELLING OR PAIN  UNUSUAL VAGINAL DISCHARGE OR ITCHING   Items with * indicate a potential emergency and should be followed up as soon as possible or go to the Emergency Department if any problems should occur.  Please show the CHEMOTHERAPY ALERT CARD or IMMUNOTHERAPY  ALERT CARD at check-in to the Emergency Department and triage nurse.  Should you have questions after your visit or need to cancel or reschedule your appointment, please contact [facility]  Dept: [phone]  and follow the prompts.  Office hours are 8:00 a.m. to 4:30 p.m. Monday - Friday. Please note that voicemails left after 4:00 p.m. may not be returned until the following business day.  We are closed weekends and major holidays. You have access to a nurse at all times for urgent questions. Please call the main number to the clinic Dept: [phone] and follow the prompts.   For any non-urgent questions, you may also contact your provider using MyChart. We now offer e-Visits for anyone 69 and older to request care online for non-urgent symptoms. For details visit [email or web address].   Also download the MyChart app! Go to the app store, search "MyChart", open the app, select , and log in with your MyChart username and password.

## 2023-04-16 ENCOUNTER — Other Ambulatory Visit: Payer: Self-pay | Admitting: Hematology and Oncology

## 2023-04-16 ENCOUNTER — Other Ambulatory Visit: Payer: Self-pay

## 2023-04-16 LAB — KAPPA/LAMBDA LIGHT CHAINS
Kappa free light chain: 29.2 mg/L — ABNORMAL HIGH (ref 3.3–19.4)
Kappa, lambda light chain ratio: 0.25 — ABNORMAL LOW (ref 0.26–1.65)
Lambda free light chains: 114.6 mg/L — ABNORMAL HIGH (ref 5.7–26.3)

## 2023-04-17 LAB — MULTIPLE MYELOMA PANEL, SERUM
Albumin SerPl Elph-Mcnc: 3.6 g/dL (ref 2.9–4.4)
Albumin/Glob SerPl: 1.2 (ref 0.7–1.7)
Alpha 1: 0.2 g/dL (ref 0.0–0.4)
Alpha2 Glob SerPl Elph-Mcnc: 0.7 g/dL (ref 0.4–1.0)
B-Globulin SerPl Elph-Mcnc: 0.9 g/dL (ref 0.7–1.3)
Gamma Glob SerPl Elph-Mcnc: 1.4 g/dL (ref 0.4–1.8)
Globulin, Total: 3.2 g/dL (ref 2.2–3.9)
IgA: 132 mg/dL (ref 90–386)
IgG (Immunoglobin G), Serum: 1443 mg/dL (ref 603–1613)
IgM (Immunoglobulin M), Srm: 47 mg/dL (ref 20–172)
Total Protein ELP: 6.8 g/dL (ref 6.0–8.5)

## 2023-04-18 ENCOUNTER — Other Ambulatory Visit: Payer: Self-pay | Admitting: Hematology and Oncology

## 2023-04-18 ENCOUNTER — Telehealth: Payer: Self-pay | Admitting: Hematology and Oncology

## 2023-04-18 NOTE — Telephone Encounter (Signed)
Pls refill electronically °

## 2023-04-18 NOTE — Telephone Encounter (Signed)
 Scheduled appointments per WQ. Talked with the patient and he is aware of the made appointments.

## 2023-04-23 DIAGNOSIS — R053 Chronic cough: Secondary | ICD-10-CM | POA: Diagnosis not present

## 2023-04-29 ENCOUNTER — Inpatient Hospital Stay

## 2023-04-29 ENCOUNTER — Encounter: Payer: Self-pay | Admitting: Hematology and Oncology

## 2023-04-29 ENCOUNTER — Inpatient Hospital Stay: Attending: Internal Medicine

## 2023-04-29 ENCOUNTER — Inpatient Hospital Stay (HOSPITAL_BASED_OUTPATIENT_CLINIC_OR_DEPARTMENT_OTHER): Admitting: Hematology and Oncology

## 2023-04-29 VITALS — BP 151/90 | HR 71 | Temp 98.0°F | Resp 18 | Ht 74.0 in | Wt 259.8 lb

## 2023-04-29 DIAGNOSIS — Z5112 Encounter for antineoplastic immunotherapy: Secondary | ICD-10-CM | POA: Diagnosis not present

## 2023-04-29 DIAGNOSIS — Z79899 Other long term (current) drug therapy: Secondary | ICD-10-CM | POA: Diagnosis not present

## 2023-04-29 DIAGNOSIS — D61818 Other pancytopenia: Secondary | ICD-10-CM

## 2023-04-29 DIAGNOSIS — C9002 Multiple myeloma in relapse: Secondary | ICD-10-CM

## 2023-04-29 LAB — CBC WITH DIFFERENTIAL (CANCER CENTER ONLY)
Abs Immature Granulocytes: 0 10*3/uL (ref 0.00–0.07)
Basophils Absolute: 0.1 10*3/uL (ref 0.0–0.1)
Basophils Relative: 2 %
Eosinophils Absolute: 0.2 10*3/uL (ref 0.0–0.5)
Eosinophils Relative: 5 %
HCT: 37.6 % — ABNORMAL LOW (ref 39.0–52.0)
Hemoglobin: 12.9 g/dL — ABNORMAL LOW (ref 13.0–17.0)
Immature Granulocytes: 0 %
Lymphocytes Relative: 45 %
Lymphs Abs: 1.6 10*3/uL (ref 0.7–4.0)
MCH: 31.9 pg (ref 26.0–34.0)
MCHC: 34.3 g/dL (ref 30.0–36.0)
MCV: 92.8 fL (ref 80.0–100.0)
Monocytes Absolute: 0.5 10*3/uL (ref 0.1–1.0)
Monocytes Relative: 16 %
Neutro Abs: 1.1 10*3/uL — ABNORMAL LOW (ref 1.7–7.7)
Neutrophils Relative %: 32 %
Platelet Count: 189 10*3/uL (ref 150–400)
RBC: 4.05 MIL/uL — ABNORMAL LOW (ref 4.22–5.81)
RDW: 13.2 % (ref 11.5–15.5)
WBC Count: 3.5 10*3/uL — ABNORMAL LOW (ref 4.0–10.5)
nRBC: 0 % (ref 0.0–0.2)

## 2023-04-29 LAB — CMP (CANCER CENTER ONLY)
ALT: 19 U/L (ref 0–44)
AST: 17 U/L (ref 15–41)
Albumin: 4.2 g/dL (ref 3.5–5.0)
Alkaline Phosphatase: 42 U/L (ref 38–126)
Anion gap: 4 — ABNORMAL LOW (ref 5–15)
BUN: 18 mg/dL (ref 6–20)
CO2: 29 mmol/L (ref 22–32)
Calcium: 9.8 mg/dL (ref 8.9–10.3)
Chloride: 104 mmol/L (ref 98–111)
Creatinine: 1.14 mg/dL (ref 0.61–1.24)
GFR, Estimated: >60 mL/min (ref >60)
Glucose, Bld: 106 mg/dL — ABNORMAL HIGH (ref 70–99)
Potassium: 4 mmol/L (ref 3.5–5.1)
Sodium: 137 mmol/L (ref 135–145)
Total Bilirubin: 0.8 mg/dL (ref 0.0–1.2)
Total Protein: 7 g/dL (ref 6.5–8.1)

## 2023-04-29 MED ORDER — BORTEZOMIB CHEMO SQ INJECTION 3.5 MG (2.5MG/ML)
0.9750 mg/m2 | Freq: Once | INTRAMUSCULAR | Status: AC
  Administered 2023-04-29: 2.5 mg via SUBCUTANEOUS
  Filled 2023-04-29: qty 1

## 2023-04-29 MED ORDER — CLINDAMYCIN PHOSPHATE 1 % EX GEL: Freq: Two times a day (BID) | CUTANEOUS | 2 refills | Status: DC

## 2023-04-29 NOTE — Progress Notes (Signed)
 [facility] OFFICE PROGRESS NOTE  Patient Care Team: Rometta [name], MD as PCP - General (Internal Medicine)  Assessment & Plan Multiple myeloma in relapse [facility]) He was diagnosed with IgG kappa multiple myeloma in 2017 after presentation with lumbar fracture.   Pathology: BM biopsy showed 50% involvement; Cytogenetics 46XY, positive for 13q-  He received combination chemotherapy with Velcade, lenalidomide and dexamethasone, followed by change of treatment to carfilzomib, Cytoxan and dexamethasone.  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  He received dexamethasone, pomalidomide and daratumumab followed by maintenance pomalidomide, discontinued in 2021  He has disease relapse in October 2023 and was restarted back on treatment with daratumumab, pomalidomide and dexamethasone with partial response.  Treatment was then switched to lenalidomide, bortezomib and dexamethasone in April 2024 with VGPR  Since discontinuation of dexamethasone, he has lost weight He will continue lenalidomide and Velcade every other week The patient desired to travel back home in May 2025 We will try to make accommodations so that he can get his pills available He will continue calcium with vitamin D supplement He is not a candidate for Zometa due to inability to get dental clearance He will continue acyclovir for antimicrobial prophylaxis and aspirin for DVT prophylaxis Pancytopenia, acquired (HCC) He has intermittent mild pancytopenia due to treatment We will proceed with treatment without delay  No orders of the defined types were placed in this encounter.    Artis Delay, MD  INTERVAL HISTORY: he returns for treatment follow-up Complications related to previous cycle of chemotherapy included pancytopenia, He desired to travel back home for 2 months and of May He is concerned about delivery of relevant to his home and what to do if he is not  able to get his pills Denies recent back pain no infection  PHYSICAL EXAMINATION: ECOG PERFORMANCE STATUS: 0 - Asymptomatic  Vitals:   04/29/23 0948  BP: (!) 151/90  Pulse: 71  Resp: 18  Temp: 98 F (36.7 C)  SpO2: 100%   Filed Weights   04/29/23 0948  Weight: 259 lb 12.8 oz (117.8 kg)    Relevant data reviewed during this visit included CBC, CMP and recent myeloma panel

## 2023-04-29 NOTE — Patient Instructions (Signed)
 [facility]  Discharge Instructions: Thank you for choosing [name] to provide your oncology and hematology care.   If you have a lab appointment with the Cancer Center, please go directly to the Cancer Center and check in at the registration area.   Wear comfortable clothing and clothing appropriate for easy access to any Portacath or PICC line.   We strive to give you quality time with your provider. You may need to reschedule your appointment if you arrive late (15 or more minutes).  Arriving late affects you and other patients whose appointments are after yours.  Also, if you miss three or more appointments without notifying the office, you may be dismissed from the clinic at the provider's discretion.      For prescription refill requests, have your pharmacy contact our office and allow 72 hours for refills to be completed.    Today you received the following chemotherapy and/or immunotherapy agents: Velcade    To help prevent nausea and vomiting after your treatment, we encourage you to take your nausea medication as directed.  BELOW ARE SYMPTOMS THAT SHOULD BE REPORTED IMMEDIATELY: *FEVER GREATER THAN 100.4 F (38 C) OR HIGHER *CHILLS OR SWEATING *NAUSEA AND VOMITING THAT IS NOT CONTROLLED WITH YOUR NAUSEA MEDICATION *UNUSUAL SHORTNESS OF BREATH *UNUSUAL BRUISING OR BLEEDING *URINARY PROBLEMS (pain or burning when urinating, or frequent urination) *BOWEL PROBLEMS (unusual diarrhea, constipation, pain near the anus) TENDERNESS IN MOUTH AND THROAT WITH OR WITHOUT PRESENCE OF ULCERS (sore throat, sores in mouth, or a toothache) UNUSUAL RASH, SWELLING OR PAIN  UNUSUAL VAGINAL DISCHARGE OR ITCHING   Items with * indicate a potential emergency and should be followed up as soon as possible or go to the Emergency Department if any problems should occur.  Please show the CHEMOTHERAPY ALERT CARD or IMMUNOTHERAPY  ALERT CARD at check-in to the Emergency Department and triage nurse.  Should you have questions after your visit or need to cancel or reschedule your appointment, please contact [facility]  Dept: [phone]  and follow the prompts.  Office hours are 8:00 a.m. to 4:30 p.m. Monday - Friday. Please note that voicemails left after 4:00 p.m. may not be returned until the following business day.  We are closed weekends and major holidays. You have access to a nurse at all times for urgent questions. Please call the main number to the clinic Dept: [phone] and follow the prompts.   For any non-urgent questions, you may also contact your provider using MyChart. We now offer e-Visits for anyone 69 and older to request care online for non-urgent symptoms. For details visit [email or web address].   Also download the MyChart app! Go to the app store, search "MyChart", open the app, select , and log in with your MyChart username and password.

## 2023-04-29 NOTE — Assessment & Plan Note (Addendum)
 He was diagnosed with IgG kappa multiple myeloma in 2017 after presentation with lumbar fracture.   Pathology: BM biopsy showed 50% involvement; Cytogenetics 46XY, positive for 13q-  He received combination chemotherapy with Velcade, lenalidomide and dexamethasone, followed by change of treatment to carfilzomib, Cytoxan and dexamethasone.  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  He received dexamethasone, pomalidomide and daratumumab followed by maintenance pomalidomide, discontinued in 2021  He has disease relapse in October 2023 and was restarted back on treatment with daratumumab, pomalidomide and dexamethasone with partial response.  Treatment was then switched to lenalidomide, bortezomib and dexamethasone in April 2024 with VGPR  Since discontinuation of dexamethasone, he has lost weight He will continue lenalidomide and Velcade every other week The patient desired to travel back home in May 2025 We will try to make accommodations so that he can get his pills available He will continue calcium with vitamin D supplement He is not a candidate for Zometa due to inability to get dental clearance He will continue acyclovir for antimicrobial prophylaxis and aspirin for DVT prophylaxis

## 2023-04-29 NOTE — Assessment & Plan Note (Addendum)
He has intermittent mild pancytopenia due to treatment We will proceed with treatment without delay

## 2023-04-30 ENCOUNTER — Other Ambulatory Visit: Payer: Self-pay

## 2023-04-30 ENCOUNTER — Encounter: Payer: Self-pay | Admitting: Hematology and Oncology

## 2023-05-12 ENCOUNTER — Other Ambulatory Visit: Payer: Self-pay | Admitting: Internal Medicine

## 2023-05-12 NOTE — Telephone Encounter (Signed)
 Copied from CRM [id number]. Topic: Clinical - Medication Refill >> May 12, 2023 10:56 AM Alpha Arts wrote: Most Recent Primary Care Visit:   Medication: losartan (COZAAR) 50 MG tablet   Has the patient contacted their pharmacy? Yes (Agent: If no, request that the patient contact the pharmacy for the refill. If patient does not wish to contact the pharmacy document the reason why and proceed with request.) (Agent: If yes, when and what did the pharmacy advise?) Pharmacy called and requested a 100 day supply  Is this the correct pharmacy for this prescription? Yes If no, delete pharmacy and type the correct one.  This is the patient's preferred pharmacy:   [id number] - [place], [place] - [address] AT [facility] OF [address] & [address] [place] [address] Phone: [phone] Fax: [phone]  Has the prescription been filled recently? Yes  Is the patient out of the medication? No  Has the patient been seen for an appointment in the last year OR does the patient have an upcoming appointment? Yes  Can we respond through MyChart? Yes  Agent: Please be advised that Rx refills may take up to 3 business days. We ask that you follow-up with your pharmacy.

## 2023-05-12 NOTE — Telephone Encounter (Signed)
 Patient's oncologist refills this medication, patient is established with Dr Almeda Jacobs, Oncology. Cancelled reorder and closing encounter. Patient is not established with [facility].

## 2023-05-13 ENCOUNTER — Other Ambulatory Visit

## 2023-05-13 ENCOUNTER — Inpatient Hospital Stay

## 2023-05-13 VITALS — BP 146/86 | HR 98 | Temp 98.5°F | Resp 18 | Wt 259.5 lb

## 2023-05-13 DIAGNOSIS — C9002 Multiple myeloma in relapse: Secondary | ICD-10-CM

## 2023-05-13 DIAGNOSIS — Z5112 Encounter for antineoplastic immunotherapy: Secondary | ICD-10-CM | POA: Diagnosis not present

## 2023-05-13 LAB — CBC WITH DIFFERENTIAL (CANCER CENTER ONLY)
Abs Immature Granulocytes: 0 10*3/uL (ref 0.00–0.07)
Basophils Absolute: 0 10*3/uL (ref 0.0–0.1)
Basophils Relative: 1 %
Eosinophils Absolute: 0.2 10*3/uL (ref 0.0–0.5)
Eosinophils Relative: 6 %
HCT: 37.6 % — ABNORMAL LOW (ref 39.0–52.0)
Hemoglobin: 12.9 g/dL — ABNORMAL LOW (ref 13.0–17.0)
Immature Granulocytes: 0 %
Lymphocytes Relative: 33 %
Lymphs Abs: 1.4 10*3/uL (ref 0.7–4.0)
MCH: 31.3 pg (ref 26.0–34.0)
MCHC: 34.3 g/dL (ref 30.0–36.0)
MCV: 91.3 fL (ref 80.0–100.0)
Monocytes Absolute: 0.5 10*3/uL (ref 0.1–1.0)
Monocytes Relative: 13 %
Neutro Abs: 1.9 10*3/uL (ref 1.7–7.7)
Neutrophils Relative %: 47 %
Platelet Count: 121 10*3/uL — ABNORMAL LOW (ref 150–400)
RBC: 4.12 MIL/uL — ABNORMAL LOW (ref 4.22–5.81)
RDW: 13.1 % (ref 11.5–15.5)
WBC Count: 4.1 10*3/uL (ref 4.0–10.5)
nRBC: 0 % (ref 0.0–0.2)

## 2023-05-13 LAB — CMP (CANCER CENTER ONLY)
ALT: 21 U/L (ref 0–44)
AST: 17 U/L (ref 15–41)
Albumin: 4.1 g/dL (ref 3.5–5.0)
Alkaline Phosphatase: 46 U/L (ref 38–126)
Anion gap: 5 (ref 5–15)
BUN: 18 mg/dL (ref 6–20)
CO2: 29 mmol/L (ref 22–32)
Calcium: 9.2 mg/dL (ref 8.9–10.3)
Chloride: 104 mmol/L (ref 98–111)
Creatinine: 1.2 mg/dL (ref 0.61–1.24)
GFR, Estimated: >60 mL/min (ref >60)
Glucose, Bld: 113 mg/dL — ABNORMAL HIGH (ref 70–99)
Potassium: 3.9 mmol/L (ref 3.5–5.1)
Sodium: 138 mmol/L (ref 135–145)
Total Bilirubin: 0.7 mg/dL (ref 0.0–1.2)
Total Protein: 7.1 g/dL (ref 6.5–8.1)

## 2023-05-13 MED ORDER — BORTEZOMIB CHEMO SQ INJECTION 3.5 MG (2.5MG/ML)
0.9750 mg/m2 | Freq: Once | INTRAMUSCULAR | Status: AC
  Administered 2023-05-13: 2.5 mg via SUBCUTANEOUS
  Filled 2023-05-13: qty 1

## 2023-05-13 NOTE — Patient Instructions (Signed)
 [facility]  Discharge Instructions: Thank you for choosing [name] to provide your oncology and hematology care.   If you have a lab appointment with the Cancer Center, please go directly to the Cancer Center and check in at the registration area.   Wear comfortable clothing and clothing appropriate for easy access to any Portacath or PICC line.   We strive to give you quality time with your provider. You may need to reschedule your appointment if you arrive late (15 or more minutes).  Arriving late affects you and other patients whose appointments are after yours.  Also, if you miss three or more appointments without notifying the office, you may be dismissed from the clinic at the provider's discretion.      For prescription refill requests, have your pharmacy contact our office and allow 72 hours for refills to be completed.    Today you received the following chemotherapy and/or immunotherapy agents: Velcade    To help prevent nausea and vomiting after your treatment, we encourage you to take your nausea medication as directed.  BELOW ARE SYMPTOMS THAT SHOULD BE REPORTED IMMEDIATELY: *FEVER GREATER THAN 100.4 F (38 C) OR HIGHER *CHILLS OR SWEATING *NAUSEA AND VOMITING THAT IS NOT CONTROLLED WITH YOUR NAUSEA MEDICATION *UNUSUAL SHORTNESS OF BREATH *UNUSUAL BRUISING OR BLEEDING *URINARY PROBLEMS (pain or burning when urinating, or frequent urination) *BOWEL PROBLEMS (unusual diarrhea, constipation, pain near the anus) TENDERNESS IN MOUTH AND THROAT WITH OR WITHOUT PRESENCE OF ULCERS (sore throat, sores in mouth, or a toothache) UNUSUAL RASH, SWELLING OR PAIN  UNUSUAL VAGINAL DISCHARGE OR ITCHING   Items with * indicate a potential emergency and should be followed up as soon as possible or go to the Emergency Department if any problems should occur.  Please show the CHEMOTHERAPY ALERT CARD or IMMUNOTHERAPY  ALERT CARD at check-in to the Emergency Department and triage nurse.  Should you have questions after your visit or need to cancel or reschedule your appointment, please contact [facility]  Dept: [phone]  and follow the prompts.  Office hours are 8:00 a.m. to 4:30 p.m. Monday - Friday. Please note that voicemails left after 4:00 p.m. may not be returned until the following business day.  We are closed weekends and major holidays. You have access to a nurse at all times for urgent questions. Please call the main number to the clinic Dept: [phone] and follow the prompts.   For any non-urgent questions, you may also contact your provider using MyChart. We now offer e-Visits for anyone 69 and older to request care online for non-urgent symptoms. For details visit [email or web address].   Also download the MyChart app! Go to the app store, search "MyChart", open the app, select , and log in with your MyChart username and password.

## 2023-05-14 LAB — KAPPA/LAMBDA LIGHT CHAINS
Kappa free light chain: 25.4 mg/L — ABNORMAL HIGH (ref 3.3–19.4)
Kappa, lambda light chain ratio: 0.23 — ABNORMAL LOW (ref 0.26–1.65)
Lambda free light chains: 111.5 mg/L — ABNORMAL HIGH (ref 5.7–26.3)

## 2023-05-15 LAB — MULTIPLE MYELOMA PANEL, SERUM
Albumin SerPl Elph-Mcnc: 3.5 g/dL (ref 2.9–4.4)
Albumin/Glob SerPl: 1.2 (ref 0.7–1.7)
Alpha 1: 0.2 g/dL (ref 0.0–0.4)
Alpha2 Glob SerPl Elph-Mcnc: 0.8 g/dL (ref 0.4–1.0)
B-Globulin SerPl Elph-Mcnc: 1 g/dL (ref 0.7–1.3)
Gamma Glob SerPl Elph-Mcnc: 1.1 g/dL (ref 0.4–1.8)
Globulin, Total: 3.1 g/dL (ref 2.2–3.9)
IgA: 124 mg/dL (ref 90–386)
IgG (Immunoglobin G), Serum: 1181 mg/dL (ref 603–1613)
IgM (Immunoglobulin M), Srm: 38 mg/dL (ref 20–172)
Total Protein ELP: 6.6 g/dL (ref 6.0–8.5)

## 2023-05-19 ENCOUNTER — Other Ambulatory Visit: Payer: Self-pay

## 2023-05-20 ENCOUNTER — Other Ambulatory Visit: Payer: Self-pay | Admitting: Hematology and Oncology

## 2023-05-27 ENCOUNTER — Inpatient Hospital Stay (HOSPITAL_BASED_OUTPATIENT_CLINIC_OR_DEPARTMENT_OTHER): Admitting: Hematology and Oncology

## 2023-05-27 ENCOUNTER — Encounter: Payer: Self-pay | Admitting: Hematology and Oncology

## 2023-05-27 ENCOUNTER — Inpatient Hospital Stay

## 2023-05-27 VITALS — BP 142/97 | HR 84 | Temp 98.4°F | Resp 16 | Wt 262.0 lb

## 2023-05-27 DIAGNOSIS — D702 Other drug-induced agranulocytosis: Secondary | ICD-10-CM

## 2023-05-27 DIAGNOSIS — Z95828 Presence of other vascular implants and grafts: Secondary | ICD-10-CM | POA: Insufficient documentation

## 2023-05-27 DIAGNOSIS — C9002 Multiple myeloma in relapse: Secondary | ICD-10-CM

## 2023-05-27 DIAGNOSIS — L708 Other acne: Secondary | ICD-10-CM | POA: Diagnosis not present

## 2023-05-27 DIAGNOSIS — Z5112 Encounter for antineoplastic immunotherapy: Secondary | ICD-10-CM | POA: Diagnosis not present

## 2023-05-27 LAB — CBC WITH DIFFERENTIAL (CANCER CENTER ONLY)
Abs Immature Granulocytes: 0.01 10*3/uL (ref 0.00–0.07)
Basophils Absolute: 0 10*3/uL (ref 0.0–0.1)
Basophils Relative: 1 %
Eosinophils Absolute: 0.1 10*3/uL (ref 0.0–0.5)
Eosinophils Relative: 4 %
HCT: 37.9 % — ABNORMAL LOW (ref 39.0–52.0)
Hemoglobin: 13.3 g/dL (ref 13.0–17.0)
Immature Granulocytes: 0 %
Lymphocytes Relative: 45 %
Lymphs Abs: 1.7 10*3/uL (ref 0.7–4.0)
MCH: 31.7 pg (ref 26.0–34.0)
MCHC: 35.1 g/dL (ref 30.0–36.0)
MCV: 90.2 fL (ref 80.0–100.0)
Monocytes Absolute: 0.5 10*3/uL (ref 0.1–1.0)
Monocytes Relative: 12 %
Neutro Abs: 1.4 10*3/uL — ABNORMAL LOW (ref 1.7–7.7)
Neutrophils Relative %: 38 %
Platelet Count: 182 10*3/uL (ref 150–400)
RBC: 4.2 MIL/uL — ABNORMAL LOW (ref 4.22–5.81)
RDW: 13.1 % (ref 11.5–15.5)
WBC Count: 3.7 10*3/uL — ABNORMAL LOW (ref 4.0–10.5)
nRBC: 0 % (ref 0.0–0.2)

## 2023-05-27 LAB — CMP (CANCER CENTER ONLY)
ALT: 21 U/L (ref 0–44)
AST: 18 U/L (ref 15–41)
Albumin: 4.2 g/dL (ref 3.5–5.0)
Alkaline Phosphatase: 41 U/L (ref 38–126)
Anion gap: 6 (ref 5–15)
BUN: 19 mg/dL (ref 6–20)
CO2: 28 mmol/L (ref 22–32)
Calcium: 9.2 mg/dL (ref 8.9–10.3)
Chloride: 104 mmol/L (ref 98–111)
Creatinine: 1.15 mg/dL (ref 0.61–1.24)
GFR, Estimated: >60 mL/min (ref >60)
Glucose, Bld: 97 mg/dL (ref 70–99)
Potassium: 3.9 mmol/L (ref 3.5–5.1)
Sodium: 138 mmol/L (ref 135–145)
Total Bilirubin: 0.8 mg/dL (ref 0.0–1.2)
Total Protein: 7 g/dL (ref 6.5–8.1)

## 2023-05-27 MED ORDER — SODIUM CHLORIDE 0.9% FLUSH: 10.0000 mL | Freq: Once | INTRAVENOUS | Status: DC

## 2023-05-27 MED ORDER — MINOCYCLINE HCL MICRONIZED 4 % EX FOAM: 1.0000 | Freq: Every day | CUTANEOUS | 1 refills | Status: AC

## 2023-05-27 MED ORDER — BORTEZOMIB CHEMO SQ INJECTION 3.5 MG (2.5MG/ML)
0.9750 mg/m2 | Freq: Once | INTRAMUSCULAR | Status: AC
  Administered 2023-05-27: 2.5 mg via SUBCUTANEOUS
  Filled 2023-05-27: qty 1

## 2023-05-27 NOTE — Assessment & Plan Note (Addendum)
 He has acneform rash on the back of his head, unrelated to treatment Previously, I placed him on Metro gel but that was not effective He declined oral antibiotic treatment I will prescribe minocycline foam for him to try

## 2023-05-27 NOTE — Assessment & Plan Note (Addendum)
This is likely due to recent treatment. The patient denies recent history of fevers, cough, chills, diarrhea or dysuria. He is asymptomatic from the leukopenia. I will observe for now.  I will continue the chemotherapy at current dose without dosage adjustment.  If the leukopenia gets progressive worse in the future, I might have to delay his treatment or adjust the chemotherapy dose. 

## 2023-05-27 NOTE — Assessment & Plan Note (Addendum)
 He was diagnosed with IgG kappa multiple myeloma in 2017 after presentation with lumbar fracture.   Pathology: BM biopsy showed 50% involvement; Cytogenetics 46XY, positive for 13q-  He received combination chemotherapy with Velcade , lenalidomide  and dexamethasone , followed by change of treatment to carfilzomib , Cytoxan  and dexamethasone .  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  He received dexamethasone , pomalidomide  and daratumumab  followed by maintenance pomalidomide , discontinued in 2021  He has disease relapse in October 2023 and was restarted back on treatment with daratumumab , pomalidomide  and dexamethasone  with partial response.  Treatment was then switched to lenalidomide , bortezomib  and dexamethasone  in April 2024 with VGPR  He will continue lenalidomide  and Velcade  every other week, dexamethasone  was discontinued due to profound weight gain  The patient desired to travel back home in May 2025, he has not purchased his air tickets yet We will try to make accommodations so that he can get his pills available He will continue calcium  with vitamin D  supplement He is not a candidate for Zometa  due to inability to get dental clearance He will continue acyclovir  for antimicrobial prophylaxis and aspirin for DVT prophylaxis

## 2023-05-27 NOTE — Progress Notes (Signed)
 [facility] OFFICE PROGRESS NOTE  Patient Care Team: Davida Espy, MD as PCP - General (Internal Medicine)  Assessment & Plan Multiple myeloma in relapse [facility]) He was diagnosed with IgG kappa multiple myeloma in 2017 after presentation with lumbar fracture.   Pathology: BM biopsy showed 50% involvement; Cytogenetics 46XY, positive for 13q-  He received combination chemotherapy with Velcade , lenalidomide  and dexamethasone , followed by change of treatment to carfilzomib , Cytoxan  and dexamethasone .  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  He received dexamethasone , pomalidomide  and daratumumab  followed by maintenance pomalidomide , discontinued in 2021  He has disease relapse in October 2023 and was restarted back on treatment with daratumumab , pomalidomide  and dexamethasone  with partial response.  Treatment was then switched to lenalidomide , bortezomib  and dexamethasone  in April 2024 with VGPR  He will continue lenalidomide  and Velcade  every other week, dexamethasone  was discontinued due to profound weight gain  The patient desired to travel back home in May 2025, he has not purchased his air tickets yet We will try to make accommodations so that he can get his pills available He will continue calcium  with vitamin D  supplement He is not a candidate for Zometa  due to inability to get dental clearance He will continue acyclovir  for antimicrobial prophylaxis and aspirin for DVT prophylaxis Drug-induced neutropenia (HCC) This is likely due to recent treatment. The patient denies recent history of fevers, cough, chills, diarrhea or dysuria. He is asymptomatic from the leukopenia. I will observe for now.  I will continue the chemotherapy at current dose without dosage adjustment.  If the leukopenia gets progressive worse in the future, I might have to delay his treatment or adjust the chemotherapy dose.   Acneiform rash He has  acneform rash on the back of his head, unrelated to treatment Previously, I placed him on Metro gel but that was not effective He declined oral antibiotic treatment I will prescribe minocycline foam for him to try  No orders of the defined types were placed in this encounter.    Almeda Jacobs, MD  INTERVAL HISTORY: he returns for treatment follow-up Complications related to previous cycle of chemotherapy included leukopenia,, rash,, and fatigue, He desires another treatment for skin rash at the back of his head No recent infection He complained of fatigue He has not purchased any tickets yet for his trip back home  PHYSICAL EXAMINATION: ECOG PERFORMANCE STATUS: 1 - Symptomatic but completely ambulatory  Vitals:   05/27/23 0939  BP: (!) 142/97  Pulse: 84  Resp: 16  Temp: 98.4 F (36.9 C)  SpO2: 96%   Filed Weights   05/27/23 0939  Weight: 262 lb (118.8 kg)  Exam nation at the back of his scalp reveals acneform rash  Relevant data reviewed during this visit included CBC and CMP

## 2023-05-28 ENCOUNTER — Other Ambulatory Visit: Payer: Self-pay

## 2023-06-10 ENCOUNTER — Inpatient Hospital Stay

## 2023-06-10 ENCOUNTER — Inpatient Hospital Stay: Attending: Internal Medicine

## 2023-06-10 VITALS — BP 152/85 | HR 84 | Temp 98.7°F | Resp 16 | Wt 259.2 lb

## 2023-06-10 DIAGNOSIS — Z79899 Other long term (current) drug therapy: Secondary | ICD-10-CM | POA: Diagnosis not present

## 2023-06-10 DIAGNOSIS — C9002 Multiple myeloma in relapse: Secondary | ICD-10-CM | POA: Diagnosis not present

## 2023-06-10 DIAGNOSIS — Z5112 Encounter for antineoplastic immunotherapy: Secondary | ICD-10-CM | POA: Insufficient documentation

## 2023-06-10 LAB — CBC WITH DIFFERENTIAL (CANCER CENTER ONLY)
Abs Immature Granulocytes: 0.01 10*3/uL (ref 0.00–0.07)
Basophils Absolute: 0 10*3/uL (ref 0.0–0.1)
Basophils Relative: 1 %
Eosinophils Absolute: 0.2 10*3/uL (ref 0.0–0.5)
Eosinophils Relative: 5 %
HCT: 35.8 % — ABNORMAL LOW (ref 39.0–52.0)
Hemoglobin: 12.5 g/dL — ABNORMAL LOW (ref 13.0–17.0)
Immature Granulocytes: 0 %
Lymphocytes Relative: 40 %
Lymphs Abs: 1.4 10*3/uL (ref 0.7–4.0)
MCH: 31.5 pg (ref 26.0–34.0)
MCHC: 34.9 g/dL (ref 30.0–36.0)
MCV: 90.2 fL (ref 80.0–100.0)
Monocytes Absolute: 0.5 10*3/uL (ref 0.1–1.0)
Monocytes Relative: 15 %
Neutro Abs: 1.4 10*3/uL — ABNORMAL LOW (ref 1.7–7.7)
Neutrophils Relative %: 39 %
Platelet Count: 154 10*3/uL (ref 150–400)
RBC: 3.97 MIL/uL — ABNORMAL LOW (ref 4.22–5.81)
RDW: 13.2 % (ref 11.5–15.5)
WBC Count: 3.6 10*3/uL — ABNORMAL LOW (ref 4.0–10.5)
nRBC: 0 % (ref 0.0–0.2)

## 2023-06-10 LAB — CMP (CANCER CENTER ONLY)
ALT: 21 U/L (ref 0–44)
AST: 16 U/L (ref 15–41)
Albumin: 4 g/dL (ref 3.5–5.0)
Alkaline Phosphatase: 43 U/L (ref 38–126)
Anion gap: 7 (ref 5–15)
BUN: 20 mg/dL (ref 6–20)
CO2: 25 mmol/L (ref 22–32)
Calcium: 9.3 mg/dL (ref 8.9–10.3)
Chloride: 105 mmol/L (ref 98–111)
Creatinine: 1.27 mg/dL — ABNORMAL HIGH (ref 0.61–1.24)
GFR, Estimated: >60 mL/min (ref >60)
Glucose, Bld: 91 mg/dL (ref 70–99)
Potassium: 3.8 mmol/L (ref 3.5–5.1)
Sodium: 137 mmol/L (ref 135–145)
Total Bilirubin: 0.6 mg/dL (ref 0.0–1.2)
Total Protein: 6.9 g/dL (ref 6.5–8.1)

## 2023-06-10 MED ORDER — BORTEZOMIB CHEMO SQ INJECTION 3.5 MG (2.5MG/ML)
0.9750 mg/m2 | Freq: Once | INTRAMUSCULAR | Status: AC
  Administered 2023-06-10: 2.5 mg via SUBCUTANEOUS
  Filled 2023-06-10: qty 1

## 2023-06-10 NOTE — Patient Instructions (Signed)
 [facility] - A DEPT OF [facility]  Discharge Instructions: Thank you for choosing [facility] to provide your oncology and hematology care.   If you have a lab appointment with the Cancer Center, please go directly to the Cancer Center and check in at the registration area.   Wear comfortable clothing and clothing appropriate for easy access to any Portacath or PICC line.   We strive to give you quality time with your provider. You may need to reschedule your appointment if you arrive late (15 or more minutes).  Arriving late affects you and other patients whose appointments are after yours.  Also, if you miss three or more appointments without notifying the office, you may be dismissed from the clinic at the provider's discretion.      For prescription refill requests, have your pharmacy contact our office and allow 72 hours for refills to be completed.    Today you received the following chemotherapy and/or immunotherapy agents velcade      To help prevent nausea and vomiting after your treatment, we encourage you to take your nausea medication as directed.  BELOW ARE SYMPTOMS THAT SHOULD BE REPORTED IMMEDIATELY: *FEVER GREATER THAN 100.4 F (38 C) OR HIGHER *CHILLS OR SWEATING *NAUSEA AND VOMITING THAT IS NOT CONTROLLED WITH YOUR NAUSEA MEDICATION *UNUSUAL SHORTNESS OF BREATH *UNUSUAL BRUISING OR BLEEDING *URINARY PROBLEMS (pain or burning when urinating, or frequent urination) *BOWEL PROBLEMS (unusual diarrhea, constipation, pain near the anus) TENDERNESS IN MOUTH AND THROAT WITH OR WITHOUT PRESENCE OF ULCERS (sore throat, sores in mouth, or a toothache) UNUSUAL RASH, SWELLING OR PAIN  UNUSUAL VAGINAL DISCHARGE OR ITCHING   Items with * indicate a potential emergency and should be followed up as soon as possible or go to the Emergency Department if any problems should occur.  Please show the CHEMOTHERAPY ALERT CARD or IMMUNOTHERAPY  ALERT CARD at check-in to the Emergency Department and triage nurse.  Should you have questions after your visit or need to cancel or reschedule your appointment, please contact [facility] - A DEPT OF [facility]  Dept: [phone]  and follow the prompts.  Office hours are 8:00 a.m. to 4:30 p.m. Monday - Friday. Please note that voicemails left after 4:00 p.m. may not be returned until the following business day.  We are closed weekends and major holidays. You have access to a nurse at all times for urgent questions. Please call the main number to the clinic Dept: [phone] and follow the prompts.   For any non-urgent questions, you may also contact your provider using MyChart. We now offer e-Visits for anyone 85 and older to request care online for non-urgent symptoms. For details visit [email or web address].   Also download the MyChart app! Go to the app store, search "MyChart", open the app, select [place], and log in with your MyChart username and password.

## 2023-06-11 LAB — KAPPA/LAMBDA LIGHT CHAINS
Kappa free light chain: 30.6 mg/L — ABNORMAL HIGH (ref 3.3–19.4)
Kappa, lambda light chain ratio: 0.3 (ref 0.26–1.65)
Lambda free light chains: 101.4 mg/L — ABNORMAL HIGH (ref 5.7–26.3)

## 2023-06-13 LAB — MULTIPLE MYELOMA PANEL, SERUM
Albumin SerPl Elph-Mcnc: 3.6 g/dL (ref 2.9–4.4)
Albumin/Glob SerPl: 1.3 (ref 0.7–1.7)
Alpha 1: 0.2 g/dL (ref 0.0–0.4)
Alpha2 Glob SerPl Elph-Mcnc: 0.7 g/dL (ref 0.4–1.0)
B-Globulin SerPl Elph-Mcnc: 0.9 g/dL (ref 0.7–1.3)
Gamma Glob SerPl Elph-Mcnc: 1.2 g/dL (ref 0.4–1.8)
Globulin, Total: 3 g/dL (ref 2.2–3.9)
IgA: 152 mg/dL (ref 90–386)
IgG (Immunoglobin G), Serum: 1339 mg/dL (ref 603–1613)
IgM (Immunoglobulin M), Srm: 41 mg/dL (ref 20–172)
Total Protein ELP: 6.6 g/dL (ref 6.0–8.5)

## 2023-06-17 ENCOUNTER — Other Ambulatory Visit: Payer: Self-pay | Admitting: Hematology and Oncology

## 2023-06-17 NOTE — Telephone Encounter (Signed)
Pls refill electronically °

## 2023-06-24 ENCOUNTER — Inpatient Hospital Stay (HOSPITAL_BASED_OUTPATIENT_CLINIC_OR_DEPARTMENT_OTHER): Admitting: Hematology and Oncology

## 2023-06-24 ENCOUNTER — Inpatient Hospital Stay

## 2023-06-24 ENCOUNTER — Encounter: Payer: Self-pay | Admitting: Hematology and Oncology

## 2023-06-24 VITALS — BP 161/100 | HR 80 | Temp 98.1°F | Resp 18 | Ht 74.0 in | Wt 260.4 lb

## 2023-06-24 VITALS — BP 103/89 | HR 74

## 2023-06-24 DIAGNOSIS — I10 Essential (primary) hypertension: Secondary | ICD-10-CM

## 2023-06-24 DIAGNOSIS — C9002 Multiple myeloma in relapse: Secondary | ICD-10-CM

## 2023-06-24 DIAGNOSIS — D61818 Other pancytopenia: Secondary | ICD-10-CM | POA: Diagnosis not present

## 2023-06-24 DIAGNOSIS — Z79899 Other long term (current) drug therapy: Secondary | ICD-10-CM | POA: Diagnosis not present

## 2023-06-24 DIAGNOSIS — Z9484 Stem cells transplant status: Secondary | ICD-10-CM | POA: Diagnosis not present

## 2023-06-24 DIAGNOSIS — L708 Other acne: Secondary | ICD-10-CM

## 2023-06-24 DIAGNOSIS — C9001 Multiple myeloma in remission: Secondary | ICD-10-CM | POA: Diagnosis not present

## 2023-06-24 DIAGNOSIS — Z5112 Encounter for antineoplastic immunotherapy: Secondary | ICD-10-CM | POA: Diagnosis not present

## 2023-06-24 DIAGNOSIS — R0602 Shortness of breath: Secondary | ICD-10-CM | POA: Diagnosis not present

## 2023-06-24 DIAGNOSIS — R5383 Other fatigue: Secondary | ICD-10-CM | POA: Diagnosis not present

## 2023-06-24 DIAGNOSIS — R002 Palpitations: Secondary | ICD-10-CM | POA: Diagnosis not present

## 2023-06-24 LAB — CBC WITH DIFFERENTIAL (CANCER CENTER ONLY)
Abs Immature Granulocytes: 0 10*3/uL (ref 0.00–0.07)
Basophils Absolute: 0.1 10*3/uL (ref 0.0–0.1)
Basophils Relative: 2 %
Eosinophils Absolute: 0.2 10*3/uL (ref 0.0–0.5)
Eosinophils Relative: 5 %
HCT: 36.6 % — ABNORMAL LOW (ref 39.0–52.0)
Hemoglobin: 12.8 g/dL — ABNORMAL LOW (ref 13.0–17.0)
Immature Granulocytes: 0 %
Lymphocytes Relative: 48 %
Lymphs Abs: 2.1 10*3/uL (ref 0.7–4.0)
MCH: 31.8 pg (ref 26.0–34.0)
MCHC: 35 g/dL (ref 30.0–36.0)
MCV: 90.8 fL (ref 80.0–100.0)
Monocytes Absolute: 0.7 10*3/uL (ref 0.1–1.0)
Monocytes Relative: 16 %
Neutro Abs: 1.2 10*3/uL — ABNORMAL LOW (ref 1.7–7.7)
Neutrophils Relative %: 29 %
Platelet Count: 155 10*3/uL (ref 150–400)
RBC: 4.03 MIL/uL — ABNORMAL LOW (ref 4.22–5.81)
RDW: 13.6 % (ref 11.5–15.5)
WBC Count: 4.2 10*3/uL (ref 4.0–10.5)
nRBC: 0 % (ref 0.0–0.2)

## 2023-06-24 LAB — CMP (CANCER CENTER ONLY)
ALT: 34 U/L (ref 0–44)
AST: 23 U/L (ref 15–41)
Albumin: 4.2 g/dL (ref 3.5–5.0)
Alkaline Phosphatase: 46 U/L (ref 38–126)
Anion gap: 5 (ref 5–15)
BUN: 17 mg/dL (ref 6–20)
CO2: 26 mmol/L (ref 22–32)
Calcium: 9.2 mg/dL (ref 8.9–10.3)
Chloride: 105 mmol/L (ref 98–111)
Creatinine: 1.12 mg/dL (ref 0.61–1.24)
GFR, Estimated: >60 mL/min (ref >60)
Glucose, Bld: 121 mg/dL — ABNORMAL HIGH (ref 70–99)
Potassium: 3.8 mmol/L (ref 3.5–5.1)
Sodium: 136 mmol/L (ref 135–145)
Total Bilirubin: 0.8 mg/dL (ref 0.0–1.2)
Total Protein: 7.2 g/dL (ref 6.5–8.1)

## 2023-06-24 MED ORDER — BORTEZOMIB CHEMO SQ INJECTION 3.5 MG (2.5MG/ML)
0.9750 mg/m2 | Freq: Once | INTRAMUSCULAR | Status: AC
  Administered 2023-06-24: 2.5 mg via SUBCUTANEOUS
  Filled 2023-06-24: qty 1

## 2023-06-24 NOTE — Assessment & Plan Note (Addendum)
 His blood pressure is high today but according to the patient, his blood pressure was better controlled at home He will continue his prescribed antihypertensives

## 2023-06-24 NOTE — Assessment & Plan Note (Addendum)
 This has resolved with recent antibiotics Observe closely This is not related to his treatment

## 2023-06-24 NOTE — Progress Notes (Signed)
 [facility] OFFICE PROGRESS NOTE  Patient Care Team: Davida Espy, MD as PCP - General (Internal Medicine)  Assessment & Plan Multiple myeloma in relapse [facility]) He was diagnosed with IgG kappa multiple myeloma in 2017 after presentation with lumbar fracture.   Pathology: BM biopsy showed 50% involvement; Cytogenetics 46XY, positive for 13q-  He received combination chemotherapy with Velcade , lenalidomide  and dexamethasone , followed by change of treatment to carfilzomib , Cytoxan  and dexamethasone .  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  He received dexamethasone , pomalidomide  and daratumumab  followed by maintenance pomalidomide , discontinued in 2021  He has disease relapse in October 2023 and was restarted back on treatment with daratumumab , pomalidomide  and dexamethasone  with partial response.  Treatment was then switched to lenalidomide , bortezomib  and dexamethasone  in April 2024 with VGPR  He will continue lenalidomide  and Velcade  monthly, dexamethasone  was discontinued due to profound weight gain  The patient desired to travel back home in May 2025, but due to concerns for immigration issues, he has canceled his plan to return home He will continue his current treatment as prescribed today He will continue calcium  with vitamin D  supplement He is not a candidate for Zometa  due to inability to get dental clearance He will continue acyclovir  for antimicrobial prophylaxis and aspirin for DVT prophylaxis Pancytopenia, acquired (HCC) He has intermittent mild pancytopenia due to treatment We will proceed with treatment without delay Essential hypertension His blood pressure is high today but according to the patient, his blood pressure was better controlled at home He will continue his prescribed antihypertensives Acneiform rash This has resolved with recent antibiotics Observe closely This is not related to his  treatment  Orders Placed This Encounter  Procedures   CBC with Differential (Cancer Center Only)    Standing Status:   Future    Expected Date:   07/22/2023    Expiration Date:   07/21/2024   CMP (Cancer Center only)    Standing Status:   Future    Expected Date:   07/22/2023    Expiration Date:   07/21/2024   CBC with Differential (Cancer Center Only)    Standing Status:   Future    Expected Date:   08/19/2023    Expiration Date:   08/18/2024   CMP (Cancer Center only)    Standing Status:   Future    Expected Date:   08/19/2023    Expiration Date:   08/18/2024   CBC with Differential (Cancer Center Only)    Standing Status:   Future    Expected Date:   09/16/2023    Expiration Date:   09/15/2024   CMP (Cancer Center only)    Standing Status:   Future    Expected Date:   09/16/2023    Expiration Date:   09/15/2024     Almeda Jacobs, MD  INTERVAL HISTORY: he returns for treatment follow-up Complications related to previous cycle of chemotherapy included pancytopenia, and elevated BP Overall, he tolerated treatment well His rash near his hairline is resolved Denies recent infection We discussed plan of care and review myeloma panel The patient has decided to cancel his vacation going back home  PHYSICAL EXAMINATION: ECOG PERFORMANCE STATUS: 0 - Asymptomatic  No results found for: "CAN125"    Latest Ref Rng & Units 06/24/2023    8:58 AM 06/10/2023    8:50 AM 05/27/2023    9:22 AM  CBC  WBC 4.0 - 10.5 K/uL 4.2  3.6  3.7  Hemoglobin 13.0 - 17.0 g/dL 91.4  78.2  95.6   Hematocrit 39.0 - 52.0 % 36.6  35.8  37.9   Platelets 150 - 400 K/uL 155  154  182       Chemistry      Component Value Date/Time   NA 137 06/10/2023 0850   NA 139 01/27/2017 0809   K 3.8 06/10/2023 0850   K 4.1 01/27/2017 0809   CL 105 06/10/2023 0850   CO2 25 06/10/2023 0850   CO2 23 01/27/2017 0809   BUN 20 06/10/2023 0850   BUN 14.3 01/27/2017 0809   CREATININE 1.27 (H) 06/10/2023 0850   CREATININE 0.9  01/27/2017 0809      Component Value Date/Time   CALCIUM  9.3 06/10/2023 0850   CALCIUM  9.1 01/27/2017 0809   ALKPHOS 43 06/10/2023 0850   ALKPHOS 42 01/27/2017 0809   AST 16 06/10/2023 0850   AST 10 01/27/2017 0809   ALT 21 06/10/2023 0850   ALT 18 01/27/2017 0809   BILITOT 0.6 06/10/2023 0850   BILITOT 0.72 01/27/2017 0809       Vitals:   06/24/23 0918  BP: (!) 161/100  Pulse: 80  Resp: 18  Temp: 98.1 F (36.7 C)  SpO2: 100%   Filed Weights   06/24/23 0918  Weight: 260 lb 6.4 oz (118.1 kg)   Other relevant data reviewed during this visit included CBC, CMP, recent myeloma panel from May 2025

## 2023-06-24 NOTE — Assessment & Plan Note (Addendum)
He has intermittent mild pancytopenia due to treatment We will proceed with treatment without delay

## 2023-06-24 NOTE — Assessment & Plan Note (Addendum)
 He was diagnosed with IgG kappa multiple myeloma in 2017 after presentation with lumbar fracture.   Pathology: BM biopsy showed 50% involvement; Cytogenetics 46XY, positive for 13q-  He received combination chemotherapy with Velcade , lenalidomide  and dexamethasone , followed by change of treatment to carfilzomib , Cytoxan  and dexamethasone .  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  He received dexamethasone , pomalidomide  and daratumumab  followed by maintenance pomalidomide , discontinued in 2021  He has disease relapse in October 2023 and was restarted back on treatment with daratumumab , pomalidomide  and dexamethasone  with partial response.  Treatment was then switched to lenalidomide , bortezomib  and dexamethasone  in April 2024 with VGPR  He will continue lenalidomide  and Velcade  monthly, dexamethasone  was discontinued due to profound weight gain  The patient desired to travel back home in May 2025, but due to concerns for immigration issues, he has canceled his plan to return home He will continue his current treatment as prescribed today He will continue calcium  with vitamin D  supplement He is not a candidate for Zometa  due to inability to get dental clearance He will continue acyclovir  for antimicrobial prophylaxis and aspirin for DVT prophylaxis

## 2023-06-25 ENCOUNTER — Other Ambulatory Visit: Payer: Self-pay

## 2023-06-25 ENCOUNTER — Other Ambulatory Visit: Payer: Self-pay | Admitting: Hematology and Oncology

## 2023-06-25 DIAGNOSIS — I493 Ventricular premature depolarization: Secondary | ICD-10-CM | POA: Diagnosis not present

## 2023-06-25 DIAGNOSIS — I499 Cardiac arrhythmia, unspecified: Secondary | ICD-10-CM | POA: Diagnosis not present

## 2023-06-25 NOTE — Progress Notes (Deleted)
OK TO PROCEED WITH TREATMENT

## 2023-06-27 ENCOUNTER — Other Ambulatory Visit: Payer: Self-pay

## 2023-07-09 DIAGNOSIS — Z9484 Stem cells transplant status: Secondary | ICD-10-CM | POA: Diagnosis not present

## 2023-07-09 DIAGNOSIS — Z01810 Encounter for preprocedural cardiovascular examination: Secondary | ICD-10-CM | POA: Diagnosis not present

## 2023-07-09 DIAGNOSIS — Z5112 Encounter for antineoplastic immunotherapy: Secondary | ICD-10-CM | POA: Diagnosis not present

## 2023-07-09 DIAGNOSIS — I493 Ventricular premature depolarization: Secondary | ICD-10-CM | POA: Diagnosis not present

## 2023-07-09 DIAGNOSIS — C9002 Multiple myeloma in relapse: Secondary | ICD-10-CM | POA: Diagnosis not present

## 2023-07-09 DIAGNOSIS — J984 Other disorders of lung: Secondary | ICD-10-CM | POA: Diagnosis not present

## 2023-07-09 DIAGNOSIS — I517 Cardiomegaly: Secondary | ICD-10-CM | POA: Diagnosis not present

## 2023-07-09 DIAGNOSIS — I340 Nonrheumatic mitral (valve) insufficiency: Secondary | ICD-10-CM | POA: Diagnosis not present

## 2023-07-09 DIAGNOSIS — I083 Combined rheumatic disorders of mitral, aortic and tricuspid valves: Secondary | ICD-10-CM | POA: Diagnosis not present

## 2023-07-09 DIAGNOSIS — I371 Nonrheumatic pulmonary valve insufficiency: Secondary | ICD-10-CM | POA: Diagnosis not present

## 2023-07-13 DIAGNOSIS — I493 Ventricular premature depolarization: Secondary | ICD-10-CM | POA: Diagnosis not present

## 2023-07-15 ENCOUNTER — Other Ambulatory Visit: Payer: Self-pay

## 2023-07-17 ENCOUNTER — Other Ambulatory Visit: Payer: Self-pay | Admitting: Hematology and Oncology

## 2023-07-18 ENCOUNTER — Telehealth: Payer: Self-pay

## 2023-07-18 DIAGNOSIS — Z5112 Encounter for antineoplastic immunotherapy: Secondary | ICD-10-CM | POA: Diagnosis not present

## 2023-07-18 DIAGNOSIS — C9002 Multiple myeloma in relapse: Secondary | ICD-10-CM | POA: Diagnosis not present

## 2023-07-18 NOTE — Telephone Encounter (Signed)
 Returned call to [facility] CART- T Coordinator. She wanted to give update.  [facility] is scheduled to see cardiology on 7/7 for recent echo with EF 40-45%. His next appt at [facility] is 7/15 and they will send a updated plan to Dr. Marton Sleeper. 7/21 the plan is to collect.  She is aware of appts at [facility] and said to leave as scheduled for now. She will call back with update.

## 2023-07-22 ENCOUNTER — Inpatient Hospital Stay: Attending: Internal Medicine

## 2023-07-22 ENCOUNTER — Encounter: Payer: Self-pay | Admitting: Hematology and Oncology

## 2023-07-22 ENCOUNTER — Inpatient Hospital Stay (HOSPITAL_BASED_OUTPATIENT_CLINIC_OR_DEPARTMENT_OTHER): Admitting: Hematology and Oncology

## 2023-07-22 ENCOUNTER — Inpatient Hospital Stay

## 2023-07-22 VITALS — BP 151/89 | HR 82 | Temp 97.9°F | Resp 18 | Ht 74.0 in | Wt 260.5 lb

## 2023-07-22 DIAGNOSIS — C9002 Multiple myeloma in relapse: Secondary | ICD-10-CM

## 2023-07-22 DIAGNOSIS — Z5112 Encounter for antineoplastic immunotherapy: Secondary | ICD-10-CM | POA: Diagnosis not present

## 2023-07-22 DIAGNOSIS — Z79899 Other long term (current) drug therapy: Secondary | ICD-10-CM | POA: Diagnosis not present

## 2023-07-22 DIAGNOSIS — I10 Essential (primary) hypertension: Secondary | ICD-10-CM | POA: Diagnosis not present

## 2023-07-22 DIAGNOSIS — D61818 Other pancytopenia: Secondary | ICD-10-CM | POA: Diagnosis not present

## 2023-07-22 LAB — CMP (CANCER CENTER ONLY)
ALT: 19 U/L (ref 0–44)
AST: 16 U/L (ref 15–41)
Albumin: 4 g/dL (ref 3.5–5.0)
Alkaline Phosphatase: 44 U/L (ref 38–126)
Anion gap: 4 — ABNORMAL LOW (ref 5–15)
BUN: 20 mg/dL (ref 6–20)
CO2: 27 mmol/L (ref 22–32)
Calcium: 9 mg/dL (ref 8.9–10.3)
Chloride: 108 mmol/L (ref 98–111)
Creatinine: 1.13 mg/dL (ref 0.61–1.24)
GFR, Estimated: >60 mL/min (ref >60)
Glucose, Bld: 99 mg/dL (ref 70–99)
Potassium: 4 mmol/L (ref 3.5–5.1)
Sodium: 139 mmol/L (ref 135–145)
Total Bilirubin: 0.5 mg/dL (ref 0.0–1.2)
Total Protein: 6.8 g/dL (ref 6.5–8.1)

## 2023-07-22 LAB — CBC WITH DIFFERENTIAL (CANCER CENTER ONLY)
Abs Immature Granulocytes: 0 10*3/uL (ref 0.00–0.07)
Basophils Absolute: 0 10*3/uL (ref 0.0–0.1)
Basophils Relative: 1 %
Eosinophils Absolute: 0.2 10*3/uL (ref 0.0–0.5)
Eosinophils Relative: 6 %
HCT: 33.3 % — ABNORMAL LOW (ref 39.0–52.0)
Hemoglobin: 11.7 g/dL — ABNORMAL LOW (ref 13.0–17.0)
Immature Granulocytes: 0 %
Lymphocytes Relative: 47 %
Lymphs Abs: 1.4 10*3/uL (ref 0.7–4.0)
MCH: 32.1 pg (ref 26.0–34.0)
MCHC: 35.1 g/dL (ref 30.0–36.0)
MCV: 91.5 fL (ref 80.0–100.0)
Monocytes Absolute: 0.4 10*3/uL (ref 0.1–1.0)
Monocytes Relative: 12 %
Neutro Abs: 1 10*3/uL — ABNORMAL LOW (ref 1.7–7.7)
Neutrophils Relative %: 34 %
Platelet Count: 144 10*3/uL — ABNORMAL LOW (ref 150–400)
RBC: 3.64 MIL/uL — ABNORMAL LOW (ref 4.22–5.81)
RDW: 14.1 % (ref 11.5–15.5)
WBC Count: 3 10*3/uL — ABNORMAL LOW (ref 4.0–10.5)
nRBC: 0 % (ref 0.0–0.2)

## 2023-07-22 MED ORDER — BORTEZOMIB CHEMO SQ INJECTION 3.5 MG (2.5MG/ML)
0.9750 mg/m2 | Freq: Once | INTRAMUSCULAR | Status: AC
  Administered 2023-07-22: 2.5 mg via SUBCUTANEOUS
  Filled 2023-07-22: qty 1

## 2023-07-22 NOTE — Assessment & Plan Note (Addendum)
 He was diagnosed with IgG kappa multiple myeloma in 2017 after presentation with lumbar fracture.   Pathology: BM biopsy showed 50% involvement; Cytogenetics 46XY, positive for 13q-  He received combination chemotherapy with Velcade , lenalidomide  and dexamethasone , followed by change of treatment to carfilzomib , Cytoxan  and dexamethasone .  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  He received dexamethasone , pomalidomide  and daratumumab  followed by maintenance pomalidomide , discontinued in 2021  He has disease relapse in October 2023 and was restarted back on treatment with daratumumab , pomalidomide  and dexamethasone  with partial response.  Treatment was then switched to lenalidomide , bortezomib  and dexamethasone  in April 2024 with VGPR  He will continue lenalidomide  and Velcade  monthly, dexamethasone  was discontinued due to profound weight gain  I have reviewed documentation from [facility] including significant findings of reduced ejection fraction on recent echocardiogram.  Cardiology consult is pending He will continue his current treatment as prescribed today He will continue calcium  with vitamin D  supplement He is not a candidate for Zometa  due to inability to get dental clearance He will continue acyclovir  for antimicrobial prophylaxis and aspirin for DVT prophylaxis

## 2023-07-22 NOTE — Assessment & Plan Note (Addendum)
 His blood pressure here is always high He has not been consistent with checking his blood pressure at home We discussed importance of blood pressure control given recent findings of cardiomyopathy and I advised him to start checking his blood pressure twice daily

## 2023-07-22 NOTE — Patient Instructions (Signed)
 [facility] - A DEPT OF [facility]  Discharge Instructions: Thank you for choosing [facility] to provide your oncology and hematology care.   If you have a lab appointment with the Cancer Center, please go directly to the Cancer Center and check in at the registration area.   Wear comfortable clothing and clothing appropriate for easy access to any Portacath or PICC line.   We strive to give you quality time with your provider. You may need to reschedule your appointment if you arrive late (15 or more minutes).  Arriving late affects you and other patients whose appointments are after yours.  Also, if you miss three or more appointments without notifying the office, you may be dismissed from the clinic at the provider's discretion.      For prescription refill requests, have your pharmacy contact our office and allow 72 hours for refills to be completed.    Today you received the following chemotherapy and/or immunotherapy agents velcade      To help prevent nausea and vomiting after your treatment, we encourage you to take your nausea medication as directed.  BELOW ARE SYMPTOMS THAT SHOULD BE REPORTED IMMEDIATELY: *FEVER GREATER THAN 100.4 F (38 C) OR HIGHER *CHILLS OR SWEATING *NAUSEA AND VOMITING THAT IS NOT CONTROLLED WITH YOUR NAUSEA MEDICATION *UNUSUAL SHORTNESS OF BREATH *UNUSUAL BRUISING OR BLEEDING *URINARY PROBLEMS (pain or burning when urinating, or frequent urination) *BOWEL PROBLEMS (unusual diarrhea, constipation, pain near the anus) TENDERNESS IN MOUTH AND THROAT WITH OR WITHOUT PRESENCE OF ULCERS (sore throat, sores in mouth, or a toothache) UNUSUAL RASH, SWELLING OR PAIN  UNUSUAL VAGINAL DISCHARGE OR ITCHING   Items with * indicate a potential emergency and should be followed up as soon as possible or go to the Emergency Department if any problems should occur.  Please show the CHEMOTHERAPY ALERT CARD or IMMUNOTHERAPY  ALERT CARD at check-in to the Emergency Department and triage nurse.  Should you have questions after your visit or need to cancel or reschedule your appointment, please contact [facility] - A DEPT OF [facility]  Dept: [phone]  and follow the prompts.  Office hours are 8:00 a.m. to 4:30 p.m. Monday - Friday. Please note that voicemails left after 4:00 p.m. may not be returned until the following business day.  We are closed weekends and major holidays. You have access to a nurse at all times for urgent questions. Please call the main number to the clinic Dept: [phone] and follow the prompts.   For any non-urgent questions, you may also contact your provider using MyChart. We now offer e-Visits for anyone 85 and older to request care online for non-urgent symptoms. For details visit [email or web address].   Also download the MyChart app! Go to the app store, search "MyChart", open the app, select [place], and log in with your MyChart username and password.

## 2023-07-22 NOTE — Assessment & Plan Note (Addendum)
He has intermittent mild pancytopenia due to treatment We will proceed with treatment without delay

## 2023-07-22 NOTE — Progress Notes (Addendum)
 [facility] OFFICE PROGRESS NOTE  Patient Care Team: Sim [name] CROME, MD as PCP - General (Internal Medicine)  Assessment & Plan Multiple myeloma in relapse [facility]) He was diagnosed with IgG kappa multiple myeloma in 2017 after presentation with lumbar fracture.   Pathology: BM biopsy showed 50% involvement; Cytogenetics 46XY, positive for 13q-  He received combination chemotherapy with Velcade , lenalidomide  and dexamethasone , followed by change of treatment to carfilzomib , Cytoxan  and dexamethasone .  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  He received dexamethasone , pomalidomide  and daratumumab  followed by maintenance pomalidomide , discontinued in 2021  He has disease relapse in October 2023 and was restarted back on treatment with daratumumab , pomalidomide  and dexamethasone  with partial response.  Treatment was then switched to lenalidomide , bortezomib  and dexamethasone  in April 2024 with VGPR  He will continue lenalidomide  and Velcade  monthly, dexamethasone  was discontinued due to profound weight gain  I have reviewed documentation from [facility] including significant findings of reduced ejection fraction on recent echocardiogram.  Cardiology consult is pending He will continue his current treatment as prescribed today He will continue calcium  with vitamin D  supplement He is not a candidate for Zometa  due to inability to get dental clearance He will continue acyclovir  for antimicrobial prophylaxis and aspirin for DVT prophylaxis Pancytopenia, acquired (HCC) He has intermittent mild pancytopenia due to treatment We will proceed with treatment without delay Essential hypertension His blood pressure here is always high He has not been consistent with checking his blood pressure at home We discussed importance of blood pressure control given recent findings of cardiomyopathy and I advised him to start checking his blood  pressure twice daily  No orders of the defined types were placed in this encounter.    Almarie Bedford, MD  INTERVAL HISTORY: he returns for treatment follow-up Complications related to previous cycle of chemotherapy included pancytopenia, and elevated BP .  He is completely asymptomatic from recent findings of cardiomyopathy The patient did not recall documentation of his blood pressure at home He denies recent infection no new side effects from treatment  PHYSICAL EXAMINATION: ECOG PERFORMANCE STATUS: 1 - Symptomatic but completely ambulatory  No results found for: CAN125    Latest Ref Rng & Units 07/22/2023    8:25 AM 06/24/2023    8:58 AM 06/10/2023    8:50 AM  CBC  WBC 4.0 - 10.5 K/uL 3.0  4.2  3.6   Hemoglobin 13.0 - 17.0 g/dL 88.2  87.1  87.4   Hematocrit 39.0 - 52.0 % 33.3  36.6  35.8   Platelets 150 - 400 K/uL 144  155  154       Chemistry      Component Value Date/Time   NA 139 07/22/2023 0825   NA 139 01/27/2017 0809   K 4.0 07/22/2023 0825   K 4.1 01/27/2017 0809   CL 108 07/22/2023 0825   CO2 27 07/22/2023 0825   CO2 23 01/27/2017 0809   BUN 20 07/22/2023 0825   BUN 14.3 01/27/2017 0809   CREATININE 1.13 07/22/2023 0825   CREATININE 0.9 01/27/2017 0809      Component Value Date/Time   CALCIUM  9.0 07/22/2023 0825   CALCIUM  9.1 01/27/2017 0809   ALKPHOS 44 07/22/2023 0825   ALKPHOS 42 01/27/2017 0809   AST 16 07/22/2023 0825   AST 10 01/27/2017 0809   ALT 19 07/22/2023 0825   ALT 18 01/27/2017 0809   BILITOT 0.5 07/22/2023 0825   BILITOT 0.72  01/27/2017 0809       Vitals:   07/22/23 0842  BP: (!) 151/89  Pulse: 82  Resp: 18  Temp: 97.9 F (36.6 C)  SpO2: 100%   Filed Weights   07/22/23 0842  Weight: 260 lb 8 oz (118.2 kg)   Other relevant data reviewed during this visit included CBC, CMP, myeloma panel from May 2025, outside records related to his visit at [facility]

## 2023-07-29 ENCOUNTER — Telehealth: Payer: Self-pay

## 2023-07-29 NOTE — Telephone Encounter (Signed)
 Sent Bradley Hunt a [employer] with below info on how to take Revlimid .

## 2023-07-29 NOTE — Telephone Encounter (Signed)
 Returned call to [facility] CART- T Coordinator. She has been trying to call [facility] and leaving messages for him.  He needs to stop Revlimid  after 7/14 dose. He will follow up at [facility] on 7/15, coordinator will share note with office after 7/15 visit. 7/21 will be CART-T cell collection day. [facility] mailed [facility] a letter with Revlimid  instructions.  Called and left a message for [facility] to stop Revlimid  after the 7/14 dose and to call the office back.  FYI

## 2023-08-12 DIAGNOSIS — Z5112 Encounter for antineoplastic immunotherapy: Secondary | ICD-10-CM | POA: Diagnosis not present

## 2023-08-12 DIAGNOSIS — C9000 Multiple myeloma not having achieved remission: Secondary | ICD-10-CM | POA: Diagnosis not present

## 2023-08-12 DIAGNOSIS — C9002 Multiple myeloma in relapse: Secondary | ICD-10-CM | POA: Diagnosis not present

## 2023-08-13 ENCOUNTER — Other Ambulatory Visit: Payer: Self-pay | Admitting: Hematology and Oncology

## 2023-08-13 NOTE — Telephone Encounter (Signed)
 Bradley Hunt,  Please contact Revlimid  refill pharmacy, his revlimid  is placed on hold for now due to CAR-T cell collection

## 2023-08-14 ENCOUNTER — Encounter: Payer: Self-pay | Admitting: Hematology and Oncology

## 2023-08-14 NOTE — Telephone Encounter (Signed)
 Placed call to [facility] and s/w pharmacist to inform per below, pt's Revlimid  is on hold d/t CAR-T cell collection. Pharmacist states pt acct is noted.

## 2023-08-18 ENCOUNTER — Telehealth: Payer: Self-pay | Admitting: *Deleted

## 2023-08-18 DIAGNOSIS — Z5112 Encounter for antineoplastic immunotherapy: Secondary | ICD-10-CM | POA: Diagnosis not present

## 2023-08-18 DIAGNOSIS — C9002 Multiple myeloma in relapse: Secondary | ICD-10-CM | POA: Diagnosis not present

## 2023-08-18 NOTE — Nursing Note (Signed)
 AVS printed, reviewed with and given to pt.  Questions answered, pt verbalized understanding.  VSS  Pt denies pain or nausea.  Awaiting hospital transport for discharge.

## 2023-08-18 NOTE — Telephone Encounter (Signed)
 Received call from Melissa at [facility] [phone].  She advised that the patient had his CarT cells collected today 08/18/2023.  She wanted to ensure that he had his follow up and is planned to receive his Zometa  and revelimid tomorrow.  Per her recollection he took his revlimid  on 07/27/2023 and had to stop the treatment early on 08/11/2023 due to her CAR T collection.  She stated his cells should be ready late August and plan to start lymph depleting therapy on 09/25/2023.  Routing to Dr Lonn LIPS.

## 2023-08-18 NOTE — Progress Notes (Signed)
  HEMATOLOGY AND ONCOLOGY - 3RD [facility] [address]  August 18, 2023 Patient: Bradley Hunt  MR Number: [id number]  Date of Birth: [birth year]   Cellular Therapy Update  Leukapheresis completed on 08/18/23. Please continue patient's current treatment regimen with Revlimid /Velcade , may restart on 08/19/23 as previously planned.    Required washout prior to lymphodepleting chemotherapy planned on 09/25/23 is:  Velcade : 14 days Revlimid : 7 days Therapeutic doses of steroids (defined as > 20 mg/day of prednisone or equivalent): 7 days   No additional doses should be given after the following dates for required washout: 09/11/23 -  Velcade  09/18/23 - Revlimid    PROJECTED DATES 07/09/23 - Pre-CAR-T evaluation testing 08/12/23 - Leukapheresis Consent signing appointment 08/18/23 - Leukapheresis 08/19/23 - Bridging Therapy (Velcade , Revlimid )  09/16/23 - CAR T Consent signing appointment 09/22/23 - Estimated Cell Ready Date 09/24/23 - Medication Education/Covid Test 09/25/23 - Lymphodepleting Chemotherapy 09/30/23 - CAR-T infusion 10/30/23 - Approximate CAR-T discharge date

## 2023-08-18 NOTE — H&P (Signed)
 [facility] H&P  Patient: Bradley Hunt MRN: [id number]          Age: 53 y.o. Sex: male  DOB: Mar 18, 1970  Associated Diagnosis: 1. Multiple myeloma in relapse (HCC)   2. Encounter for antineoplastic immunotherapy          Author: Sunnie Leigh Flowers, PA-C       Basic Information:  Source of history: patient   Impression and Plan  Patient presents today for vascath placement for CAR-T therapy.    [employer], PA-C 9:34 AM 08/18/23
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
## 2023-08-18 NOTE — Anesthesia Postprocedure Evaluation (Signed)
 Interventional Radiology Brief Postprocedure Note  Patient: Bradley Hunt Age: 53 y.o.  MRN: [id number] Attending: No att. providers found   Date: August 18, 2023 10:29 AM  Procedure performed: IR Vascath Insertion  Pre procedure diagnosis: CAR-T therapy  Primary Proceduralist: Annitta Massa Flowers, PA-C  Assistant(s): none   Relevant Labs:  Lab Results  Component Value Date   CREATININE 1.25 08/18/2023   INR 0.9 07/09/2023   PROTIME 10.2 07/09/2023    Post procedure diagnosis: CAR-T therapy  Anesthesia (type): Moderate Sedation  Implants: Nothing was implanted during the procedure  Specimen:    Disposition of specimens: No  Blood administered: See Blood Administration Record.  Estimated blood loss: Minimal  Increased risk of post procedure bleeding: No  Complications: None noted  Most recent vitals: Vitals:   08/18/23 1025  BP: (!) 130/92  Pulse: 71  Resp: 12  Temp:   SpO2: 100%    Intake and Output: No intake/output data recorded.  Drain output: Output by Drain (mL) 08/16/23 0701 - 08/16/23 1900 08/16/23 1901 - 08/17/23 0700 08/17/23 0701 - 08/17/23 1900 08/17/23 1901 - 08/18/23 0700 08/18/23 0701 - 08/18/23 1029  Patient has no LDAs of requested type attached.    Findings and Description: vascath placed, right internal jugular.  16cm. Tip in the right [facility]. ready for immediate use.       IR Procedure Medications: Medications (Filter: Administrations occurring from 0936 to 1029 on 08/18/23) As of 08/18/23 1029    fentaNYL  (SUBLIMAZE ) injection (mcg) Total dose:  125 mcg    Date/Time Rate/Dose/Volume Action Route Admin User     08/18/23  1004 50 mcg Given intravenous Abigail Jenkins Amel, RN     1009 25 mcg Given intravenous Abigail Jenkins Amel, RN     1014 25 mcg Given intravenous Abigail Jenkins Amel, RN     1015 25 mcg Given intravenous Abigail Jenkins Amel, RN          midazolam  (VERSED ) injection (mg) Total dose:  2 mg    Date/Time  Rate/Dose/Volume Action Route Admin User     08/18/23  1004 1 mg Given intravenous Abigail Jenkins Amel, RN     1009 0.5 mg Given intravenous Abigail Jenkins Amel, RN     1014 0.5 mg Given intravenous Abigail Jenkins Amel, RN               See detailed result report with images in PACS.  The patient tolerated the procedure well without incident or complication and is in stable condition.  SABRA

## 2023-08-19 ENCOUNTER — Encounter: Payer: Self-pay | Admitting: Hematology and Oncology

## 2023-08-19 ENCOUNTER — Inpatient Hospital Stay: Attending: Internal Medicine

## 2023-08-19 ENCOUNTER — Inpatient Hospital Stay (HOSPITAL_BASED_OUTPATIENT_CLINIC_OR_DEPARTMENT_OTHER): Admitting: Hematology and Oncology

## 2023-08-19 ENCOUNTER — Inpatient Hospital Stay

## 2023-08-19 ENCOUNTER — Other Ambulatory Visit: Payer: Self-pay

## 2023-08-19 VITALS — BP 155/100 | HR 76 | Temp 97.6°F | Resp 18 | Ht 74.0 in | Wt 252.8 lb

## 2023-08-19 DIAGNOSIS — C9002 Multiple myeloma in relapse: Secondary | ICD-10-CM

## 2023-08-19 DIAGNOSIS — Z79899 Other long term (current) drug therapy: Secondary | ICD-10-CM | POA: Insufficient documentation

## 2023-08-19 DIAGNOSIS — D61818 Other pancytopenia: Secondary | ICD-10-CM | POA: Diagnosis not present

## 2023-08-19 DIAGNOSIS — Z5112 Encounter for antineoplastic immunotherapy: Secondary | ICD-10-CM | POA: Insufficient documentation

## 2023-08-19 LAB — CMP (CANCER CENTER ONLY)
ALT: 17 U/L (ref 0–44)
AST: 14 U/L — ABNORMAL LOW (ref 15–41)
Albumin: 3.7 g/dL (ref 3.5–5.0)
Alkaline Phosphatase: 41 U/L (ref 38–126)
Anion gap: 3 — ABNORMAL LOW (ref 5–15)
BUN: 19 mg/dL (ref 6–20)
CO2: 29 mmol/L (ref 22–32)
Calcium: 9.4 mg/dL (ref 8.9–10.3)
Chloride: 107 mmol/L (ref 98–111)
Creatinine: 1.03 mg/dL (ref 0.61–1.24)
GFR, Estimated: >60 mL/min (ref >60)
Glucose, Bld: 86 mg/dL (ref 70–99)
Potassium: 3.9 mmol/L (ref 3.5–5.1)
Sodium: 139 mmol/L (ref 135–145)
Total Bilirubin: 0.6 mg/dL (ref 0.0–1.2)
Total Protein: 6.8 g/dL (ref 6.5–8.1)

## 2023-08-19 LAB — CBC WITH DIFFERENTIAL (CANCER CENTER ONLY)
Abs Immature Granulocytes: 0.01 K/uL (ref 0.00–0.07)
Basophils Absolute: 0 K/uL (ref 0.0–0.1)
Basophils Relative: 1 %
Eosinophils Absolute: 0 K/uL (ref 0.0–0.5)
Eosinophils Relative: 2 %
HCT: 33.2 % — ABNORMAL LOW (ref 39.0–52.0)
Hemoglobin: 11.7 g/dL — ABNORMAL LOW (ref 13.0–17.0)
Immature Granulocytes: 0 %
Lymphocytes Relative: 35 %
Lymphs Abs: 1 K/uL (ref 0.7–4.0)
MCH: 31.8 pg (ref 26.0–34.0)
MCHC: 35.2 g/dL (ref 30.0–36.0)
MCV: 90.2 fL (ref 80.0–100.0)
Monocytes Absolute: 0.4 K/uL (ref 0.1–1.0)
Monocytes Relative: 14 %
Neutro Abs: 1.3 K/uL — ABNORMAL LOW (ref 1.7–7.7)
Neutrophils Relative %: 48 %
Platelet Count: 108 K/uL — ABNORMAL LOW (ref 150–400)
RBC: 3.68 MIL/uL — ABNORMAL LOW (ref 4.22–5.81)
RDW: 14 % (ref 11.5–15.5)
WBC Count: 2.8 K/uL — ABNORMAL LOW (ref 4.0–10.5)
nRBC: 0 % (ref 0.0–0.2)

## 2023-08-19 MED ORDER — LENALIDOMIDE 25 MG PO CAPS: ORAL_CAPSULE | ORAL | 0 refills | Status: DC

## 2023-08-19 MED ORDER — BORTEZOMIB CHEMO SQ INJECTION 3.5 MG (2.5MG/ML)
0.9750 mg/m2 | Freq: Once | INTRAMUSCULAR | Status: AC
  Administered 2023-08-19: 2.5 mg via SUBCUTANEOUS
  Filled 2023-08-19: qty 1

## 2023-08-19 NOTE — Progress Notes (Signed)
 [facility] OFFICE PROGRESS NOTE  Patient Care Team: Sim [name] CROME, MD as PCP - General (Internal Medicine)  Assessment & Plan Multiple myeloma in relapse [facility]) He was diagnosed with IgG kappa multiple myeloma in 2017 after presentation with lumbar fracture.   Pathology: BM biopsy showed 50% involvement; Cytogenetics 46XY, positive for 13q-  He received combination chemotherapy with Velcade , lenalidomide  and dexamethasone , followed by change of treatment to carfilzomib , Cytoxan  and dexamethasone .  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  He received dexamethasone , pomalidomide  and daratumumab  followed by maintenance pomalidomide , discontinued in 2021  He has disease relapse in October 2023 and was restarted back on treatment with daratumumab , pomalidomide  and dexamethasone  with partial response.  Treatment was then switched to lenalidomide , bortezomib  and dexamethasone  in April 2024 with VGPR  He will continue lenalidomide  and Velcade  monthly, dexamethasone  was discontinued due to profound weight gain  I have reviewed documentation from [facility] related to plan of care The patient has completed leukapheresis recently  According to the scheduled sent to me, the last day of Velcade  would be on August 14 and Revlimid  on August 21  I gave the patient written instruction to resume Revlimid  We will refill his prescription for 1 more month but the patient is instructed to stop Revlimid  on August 21st Last dose of Velcade  will be today due to every 28-day cycle The patient will be receiving CAR-T cell infusion on September 30, 2023 he will continue calcium  with vitamin D  supplement He is not a candidate for Zometa  due to inability to get dental clearance He will continue acyclovir  for antimicrobial prophylaxis and aspirin for DVT prophylaxis Pancytopenia, acquired (HCC) He has intermittent mild pancytopenia due to treatment We  will proceed with treatment without delay  No orders of the defined types were placed in this encounter.    Almarie Bedford, MD  INTERVAL HISTORY: he returns for treatment follow-up Complications related to previous cycle of chemotherapy included pancytopenia,  PHYSICAL EXAMINATION: ECOG PERFORMANCE STATUS: 0 - Asymptomatic  No results found for: [id number]    Latest Ref Rng & Units 08/19/2023   10:46 AM 07/22/2023    8:25 AM 06/24/2023    8:58 AM  CBC  WBC 4.0 - 10.5 K/uL 2.8  3.0  4.2   Hemoglobin 13.0 - 17.0 g/dL 88.2  88.2  87.1   Hematocrit 39.0 - 52.0 % 33.2  33.3  36.6   Platelets 150 - 400 K/uL 108  144  155       Chemistry      Component Value Date/Time   NA 139 08/19/2023 1046   NA 139 01/27/2017 0809   K 3.9 08/19/2023 1046   K 4.1 01/27/2017 0809   CL 107 08/19/2023 1046   CO2 29 08/19/2023 1046   CO2 23 01/27/2017 0809   BUN 19 08/19/2023 1046   BUN 14.3 01/27/2017 0809   CREATININE 1.03 08/19/2023 1046   CREATININE 0.9 01/27/2017 0809      Component Value Date/Time   CALCIUM  9.4 08/19/2023 1046   CALCIUM  9.1 01/27/2017 0809   ALKPHOS 41 08/19/2023 1046   ALKPHOS 42 01/27/2017 0809   AST 14 (L) 08/19/2023 1046   AST 10 01/27/2017 0809   ALT 17 08/19/2023 1046   ALT 18 01/27/2017 0809   BILITOT 0.6 08/19/2023 1046   BILITOT 0.72 01/27/2017 0809       Vitals:   08/19/23 1112  BP: (!) 155/100  Pulse: 76  Resp: 18  Temp: 97.6 F (36.4 C)  SpO2: 99%   Filed Weights   08/19/23 1112  Weight: 252 lb 12.8 oz (114.7 kg)   Other relevant data reviewed during this visit included CBC, CMP, recent myeloma panel, documentation from [facility]

## 2023-08-19 NOTE — Assessment & Plan Note (Addendum)
 He was diagnosed with IgG kappa multiple myeloma in 2017 after presentation with lumbar fracture.   Pathology: BM biopsy showed 50% involvement; Cytogenetics 46XY, positive for 13q-  He received combination chemotherapy with Velcade , lenalidomide  and dexamethasone , followed by change of treatment to carfilzomib , Cytoxan  and dexamethasone .  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  He received dexamethasone , pomalidomide  and daratumumab  followed by maintenance pomalidomide , discontinued in 2021  He has disease relapse in October 2023 and was restarted back on treatment with daratumumab , pomalidomide  and dexamethasone  with partial response.  Treatment was then switched to lenalidomide , bortezomib  and dexamethasone  in April 2024 with VGPR  He will continue lenalidomide  and Velcade  monthly, dexamethasone  was discontinued due to profound weight gain  I have reviewed documentation from [facility] related to plan of care The patient has completed leukapheresis recently  According to the scheduled sent to me, the last day of Velcade  would be on August 14 and Revlimid  on August 21  I gave the patient written instruction to resume Revlimid  We will refill his prescription for 1 more month but the patient is instructed to stop Revlimid  on August 21st Last dose of Velcade  will be today due to every 28-day cycle The patient will be receiving CAR-T cell infusion on September 30, 2023 he will continue calcium  with vitamin D  supplement He is not a candidate for Zometa  due to inability to get dental clearance He will continue acyclovir  for antimicrobial prophylaxis and aspirin for DVT prophylaxis

## 2023-08-19 NOTE — Patient Instructions (Signed)
 [facility]  Discharge Instructions: Thank you for choosing [name] to provide your oncology and hematology care.   If you have a lab appointment with the Cancer Center, please go directly to the Cancer Center and check in at the registration area.   Wear comfortable clothing and clothing appropriate for easy access to any Portacath or PICC line.   We strive to give you quality time with your provider. You may need to reschedule your appointment if you arrive late (15 or more minutes).  Arriving late affects you and other patients whose appointments are after yours.  Also, if you miss three or more appointments without notifying the office, you may be dismissed from the clinic at the provider's discretion.      For prescription refill requests, have your pharmacy contact our office and allow 72 hours for refills to be completed.    Today you received the following chemotherapy and/or immunotherapy agents: Velcade    To help prevent nausea and vomiting after your treatment, we encourage you to take your nausea medication as directed.  BELOW ARE SYMPTOMS THAT SHOULD BE REPORTED IMMEDIATELY: *FEVER GREATER THAN 100.4 F (38 C) OR HIGHER *CHILLS OR SWEATING *NAUSEA AND VOMITING THAT IS NOT CONTROLLED WITH YOUR NAUSEA MEDICATION *UNUSUAL SHORTNESS OF BREATH *UNUSUAL BRUISING OR BLEEDING *URINARY PROBLEMS (pain or burning when urinating, or frequent urination) *BOWEL PROBLEMS (unusual diarrhea, constipation, pain near the anus) TENDERNESS IN MOUTH AND THROAT WITH OR WITHOUT PRESENCE OF ULCERS (sore throat, sores in mouth, or a toothache) UNUSUAL RASH, SWELLING OR PAIN  UNUSUAL VAGINAL DISCHARGE OR ITCHING   Items with * indicate a potential emergency and should be followed up as soon as possible or go to the Emergency Department if any problems should occur.  Please show the CHEMOTHERAPY ALERT CARD or IMMUNOTHERAPY  ALERT CARD at check-in to the Emergency Department and triage nurse.  Should you have questions after your visit or need to cancel or reschedule your appointment, please contact [facility]  Dept: [phone]  and follow the prompts.  Office hours are 8:00 a.m. to 4:30 p.m. Monday - Friday. Please note that voicemails left after 4:00 p.m. may not be returned until the following business day.  We are closed weekends and major holidays. You have access to a nurse at all times for urgent questions. Please call the main number to the clinic Dept: [phone] and follow the prompts.   For any non-urgent questions, you may also contact your provider using MyChart. We now offer e-Visits for anyone 69 and older to request care online for non-urgent symptoms. For details visit [email or web address].   Also download the MyChart app! Go to the app store, search "MyChart", open the app, select , and log in with your MyChart username and password.

## 2023-08-19 NOTE — Telephone Encounter (Signed)
 Refill sent and s/w [facility] for them to remove held note from pt's record.

## 2023-08-19 NOTE — Telephone Encounter (Signed)
 Pls send refill now, just received notification from [facility] ok to resume Revlimid  today

## 2023-08-19 NOTE — Assessment & Plan Note (Addendum)
He has intermittent mild pancytopenia due to treatment We will proceed with treatment without delay

## 2023-08-20 ENCOUNTER — Other Ambulatory Visit: Payer: Self-pay

## 2023-09-01 DIAGNOSIS — Z9484 Stem cells transplant status: Secondary | ICD-10-CM | POA: Diagnosis not present

## 2023-09-01 DIAGNOSIS — I5023 Acute on chronic systolic (congestive) heart failure: Secondary | ICD-10-CM | POA: Diagnosis not present

## 2023-09-01 DIAGNOSIS — C9002 Multiple myeloma in relapse: Secondary | ICD-10-CM | POA: Diagnosis not present

## 2023-09-01 DIAGNOSIS — I2510 Atherosclerotic heart disease of native coronary artery without angina pectoris: Secondary | ICD-10-CM | POA: Diagnosis not present

## 2023-09-01 DIAGNOSIS — Z5112 Encounter for antineoplastic immunotherapy: Secondary | ICD-10-CM | POA: Diagnosis not present

## 2023-09-04 DIAGNOSIS — I5023 Acute on chronic systolic (congestive) heart failure: Secondary | ICD-10-CM | POA: Diagnosis not present

## 2023-09-05 DIAGNOSIS — I517 Cardiomegaly: Secondary | ICD-10-CM | POA: Diagnosis not present

## 2023-09-05 DIAGNOSIS — I493 Ventricular premature depolarization: Secondary | ICD-10-CM | POA: Diagnosis not present

## 2023-09-10 NOTE — Progress Notes (Signed)
   Outpatient Oncology Social Work Supportive Oncology [facility]   Outpatient Oncology Social Work Note   PATIENT NAME: Bradley Hunt DATE: 09/10/2023  TIME: 11:22 AM  Note Type: Initial Assessment Referral Source: Eleanor Board, RN Referral Reason: Financial Patient Overview: Bradley Hunt is a 53 y.o. with Multiple Myeloma.  Social Work Progress Note   MSW applied for the [employer] Fund for the patient and requested the maximum award amount of $1,000. The purpose of these funds will be to cover his next Medicare premium payment of $740.00, which will be due prior to the end of planned CAR-T.    Bradley Hunt, MSW

## 2023-09-11 ENCOUNTER — Telehealth: Payer: Self-pay

## 2023-09-11 ENCOUNTER — Other Ambulatory Visit: Payer: Self-pay | Admitting: Hematology and Oncology

## 2023-09-11 NOTE — Telephone Encounter (Signed)
 S/w Eleanor Board, RN regarding updates on patient's CAR-T treatment. Patient's treatment has been delayed and is expected to start lymphodepletion on 9/12.  This will alter expected completion of patient's Revlimid. Received fax from [facility] Dr. Rick to review.

## 2023-09-11 NOTE — Telephone Encounter (Signed)
 HOLD off refill

## 2023-09-11 NOTE — Progress Notes (Signed)
  HEMATOLOGY AND ONCOLOGY - 3RD [facility] [place] [address]  September 11, 2023 Patient: Bradley Hunt  MR Number: [id number]  Date of Birth: [birth year]   Cellular Therapy Update  Timeline for CAR T has been delayed pending clearance from cardiology. Repeat ECHO planned 09/30/23, Cardiology visit 10/03/23. Please continue Revlimid only until start of CAR T. Dr Arman does not want patient to receive additional doses of Velcade.   08/19/23 Last dose of Velcade  09/26/23 Last dose of Revlimid  PROJECTED DATES 07/09/23 - Pre-CAR-T evaluation testing 07/22/23 - Treatment (Velcade with Dr Lonn) 07/27/23 - Restart Revlimid 08/11/23 - Stop Revlimid for required washout 08/12/23 - Leukapheresis Consent signing appointment 08/18/23 - Leukapheresis 08/19/23 - Bridging Therapy - Velcade/Revlimid (stopped Revlimid 09/09/23)  09/16/23 - Restart Revlimid only 09/16/23 09/22/23 - Estimated Cell Ready Date 09/26/23 - Last planned dose of Revlimid 10/08/23 - Visit with Dr Arman for consent/clearance  10/09/23/25 - Medication Education/Covid Test 10/10/23 - Lymphodepletion 10/15/23 - CAR-T infusion 11/14/23 - Approximate CAR-T discharge date  ALL DATES ARE SUBJECT TO CHANGE  Eleanor Board BSN, RN Stem Cell Transplant and Cellular Therapy Coordinator Phone: [phone] / Fax: [phone]

## 2023-09-12 ENCOUNTER — Telehealth: Payer: Self-pay

## 2023-09-12 ENCOUNTER — Other Ambulatory Visit: Payer: Self-pay

## 2023-09-12 MED ORDER — LENALIDOMIDE 25 MG PO CAPS: ORAL_CAPSULE | ORAL | 0 refills | Status: DC

## 2023-09-12 NOTE — Telephone Encounter (Signed)
 S/w patient regarding updates to his treatment plan.  Patient aware that he will be restarting his Revlimid on 8/19 and will complete final dose on 8/29. Patient aware that refill has been sent in to his pharmacy. Patient scheduled for labs and follow-up with Dr. Lonn on 8/25. Appointment date and times confirmed with patient.

## 2023-09-12 NOTE — Telephone Encounter (Signed)
 Attempted to contact patient to schedule appointments for lab and visit with Dr. Lonn. Unable to leave a message at this time d/t full mailbox.  Will attempt to contact patient later.

## 2023-09-14 ENCOUNTER — Other Ambulatory Visit: Payer: Self-pay

## 2023-09-19 ENCOUNTER — Other Ambulatory Visit: Payer: Self-pay | Admitting: Hematology and Oncology

## 2023-09-19 DIAGNOSIS — C9002 Multiple myeloma in relapse: Secondary | ICD-10-CM

## 2023-09-22 ENCOUNTER — Inpatient Hospital Stay (HOSPITAL_BASED_OUTPATIENT_CLINIC_OR_DEPARTMENT_OTHER): Admitting: Hematology and Oncology

## 2023-09-22 ENCOUNTER — Encounter: Payer: Self-pay | Admitting: Hematology and Oncology

## 2023-09-22 ENCOUNTER — Inpatient Hospital Stay: Attending: Internal Medicine

## 2023-09-22 VITALS — BP 149/95 | HR 98 | Resp 18 | Ht 74.0 in | Wt 251.6 lb

## 2023-09-22 DIAGNOSIS — D61818 Other pancytopenia: Secondary | ICD-10-CM

## 2023-09-22 DIAGNOSIS — I429 Cardiomyopathy, unspecified: Secondary | ICD-10-CM | POA: Diagnosis not present

## 2023-09-22 DIAGNOSIS — C9002 Multiple myeloma in relapse: Secondary | ICD-10-CM

## 2023-09-22 LAB — COMPREHENSIVE METABOLIC PANEL WITH GFR
ALT: 18 U/L (ref 0–44)
AST: 15 U/L (ref 15–41)
Albumin: 4.1 g/dL (ref 3.5–5.0)
Alkaline Phosphatase: 45 U/L (ref 38–126)
Anion gap: 5 (ref 5–15)
BUN: 18 mg/dL (ref 6–20)
CO2: 26 mmol/L (ref 22–32)
Calcium: 9.2 mg/dL (ref 8.9–10.3)
Chloride: 105 mmol/L (ref 98–111)
Creatinine, Ser: 1.03 mg/dL (ref 0.61–1.24)
GFR, Estimated: >60 mL/min (ref >60)
Glucose, Bld: 103 mg/dL — ABNORMAL HIGH (ref 70–99)
Potassium: 3.7 mmol/L (ref 3.5–5.1)
Sodium: 136 mmol/L (ref 135–145)
Total Bilirubin: 0.6 mg/dL (ref 0.0–1.2)
Total Protein: 6.8 g/dL (ref 6.5–8.1)

## 2023-09-22 LAB — CBC WITH DIFFERENTIAL/PLATELET
Abs Immature Granulocytes: 0 K/uL (ref 0.00–0.07)
Basophils Absolute: 0 K/uL (ref 0.0–0.1)
Basophils Relative: 1 %
Eosinophils Absolute: 0.1 K/uL (ref 0.0–0.5)
Eosinophils Relative: 2 %
HCT: 37.3 % — ABNORMAL LOW (ref 39.0–52.0)
Hemoglobin: 12.8 g/dL — ABNORMAL LOW (ref 13.0–17.0)
Immature Granulocytes: 0 %
Lymphocytes Relative: 17 %
Lymphs Abs: 0.5 K/uL — ABNORMAL LOW (ref 0.7–4.0)
MCH: 31.2 pg (ref 26.0–34.0)
MCHC: 34.3 g/dL (ref 30.0–36.0)
MCV: 91 fL (ref 80.0–100.0)
Monocytes Absolute: 0.3 K/uL (ref 0.1–1.0)
Monocytes Relative: 11 %
Neutro Abs: 2 K/uL (ref 1.7–7.7)
Neutrophils Relative %: 69 %
Platelets: 141 K/uL — ABNORMAL LOW (ref 150–400)
RBC: 4.1 MIL/uL — ABNORMAL LOW (ref 4.22–5.81)
RDW: 14.3 % (ref 11.5–15.5)
WBC: 2.9 K/uL — ABNORMAL LOW (ref 4.0–10.5)
nRBC: 0 % (ref 0.0–0.2)

## 2023-09-22 NOTE — Assessment & Plan Note (Addendum)
 His echocardiogram from [facility] reveal slight cardiomyopathy but CT angiography showed no critical stenosis He will continue close monitoring and follow-up by cardiologist He has no signs or symptoms of congestive heart failure

## 2023-09-22 NOTE — Progress Notes (Signed)
 [facility] OFFICE PROGRESS NOTE  Patient Care Team: Sim [name] CROME, MD as PCP - General (Internal Medicine)  Assessment & Plan Multiple myeloma in relapse [facility]) He was diagnosed with IgG kappa multiple myeloma in 2017 after presentation with lumbar fracture.   Pathology: BM biopsy showed 50% involvement; Cytogenetics 46XY, positive for 13q-  He received combination chemotherapy with Velcade , lenalidomide  and dexamethasone , followed by change of treatment to carfilzomib , Cytoxan  and dexamethasone .  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  He received dexamethasone , pomalidomide  and daratumumab  followed by maintenance pomalidomide , discontinued in 2021  He has disease relapse in October 2023 and was restarted back on treatment with daratumumab , pomalidomide  and dexamethasone  with partial response.  Treatment was then switched to lenalidomide , bortezomib  and dexamethasone  in April 2024 with VGPR  He will continue lenalidomide  and Velcade  monthly, dexamethasone  was discontinued due to profound weight gain  Overall, he has excellent response to therapy. Outpatient PET/CT imaging show no evidence of lytic lesions He will complete last dose of lenalidomide  on September 26, 2023 per instruction from his oncologist from [facility]  He will receive chemotherapy and CAR-T cell infusion next month I have not scheduled any outpatient follow-up until I hear back from [facility] after his treatment He will continue acyclovir  for antimicrobial prophylaxis Cardiomyopathy, unspecified type (HCC) His echocardiogram from [facility] reveal slight cardiomyopathy but CT angiography showed no critical stenosis He will continue close monitoring and follow-up by cardiologist He has no signs or symptoms of congestive heart failure Pancytopenia, acquired (HCC) He has intermittent mild pancytopenia due to treatment He is not symptomatic Observe  only  No orders of the defined types were placed in this encounter.    Almarie Bedford, MD  INTERVAL HISTORY: he returns for treatment follow-up Complications related to previous cycle of chemotherapy included pancytopenia, He was diagnosed with cardiomyopathy due to abnormal echocardiogram but no CT angiographic lesions He is on medical management His CAR-T cell treatment was delay because of that Overall, he is doing well He is scheduled for treatment at [facility] next month He will be completing lenalidomide  at the end of the week  PHYSICAL EXAMINATION: ECOG PERFORMANCE STATUS: 0 - Asymptomatic  No results found for: [id number]    Latest Ref Rng & Units 09/22/2023   11:35 AM 08/19/2023   10:46 AM 07/22/2023    8:25 AM  CBC  WBC 4.0 - 10.5 K/uL 2.9  2.8  3.0   Hemoglobin 13.0 - 17.0 g/dL 87.1  88.2  88.2   Hematocrit 39.0 - 52.0 % 37.3  33.2  33.3   Platelets 150 - 400 K/uL 141  108  144       Chemistry      Component Value Date/Time   NA 139 08/19/2023 1046   NA 139 01/27/2017 0809   K 3.9 08/19/2023 1046   K 4.1 01/27/2017 0809   CL 107 08/19/2023 1046   CO2 29 08/19/2023 1046   CO2 23 01/27/2017 0809   BUN 19 08/19/2023 1046   BUN 14.3 01/27/2017 0809   CREATININE 1.03 08/19/2023 1046   CREATININE 0.9 01/27/2017 0809      Component Value Date/Time   CALCIUM  9.4 08/19/2023 1046   CALCIUM  9.1 01/27/2017 0809   ALKPHOS 41 08/19/2023 1046   ALKPHOS 42 01/27/2017 0809   AST 14 (L) 08/19/2023 1046   AST 10 01/27/2017 0809   ALT 17 08/19/2023 1046   ALT 18 01/27/2017  0809   BILITOT 0.6 08/19/2023 1046   BILITOT 0.72 01/27/2017 0809       Vitals:   09/22/23 1149  BP: (!) 149/95  Pulse: (!) 18  Resp: (!) 98  SpO2: 97%   Filed Weights   09/22/23 1149  Weight: 251 lb 9.6 oz (114.1 kg)   Other relevant data reviewed during this visit included CBC, CMP, outside records

## 2023-09-22 NOTE — Assessment & Plan Note (Addendum)
 He was diagnosed with IgG kappa multiple myeloma in 2017 after presentation with lumbar fracture.   Pathology: BM biopsy showed 50% involvement; Cytogenetics 46XY, positive for 13q-  He received combination chemotherapy with Velcade , lenalidomide  and dexamethasone , followed by change of treatment to carfilzomib , Cytoxan  and dexamethasone .  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  He received dexamethasone , pomalidomide  and daratumumab  followed by maintenance pomalidomide , discontinued in 2021  He has disease relapse in October 2023 and was restarted back on treatment with daratumumab , pomalidomide  and dexamethasone  with partial response.  Treatment was then switched to lenalidomide , bortezomib  and dexamethasone  in April 2024 with VGPR  He will continue lenalidomide  and Velcade  monthly, dexamethasone  was discontinued due to profound weight gain  Overall, he has excellent response to therapy. Outpatient PET/CT imaging show no evidence of lytic lesions He will complete last dose of lenalidomide  on September 26, 2023 per instruction from his oncologist from [facility]  He will receive chemotherapy and CAR-T cell infusion next month I have not scheduled any outpatient follow-up until I hear back from [facility] after his treatment He will continue acyclovir  for antimicrobial prophylaxis

## 2023-09-22 NOTE — Assessment & Plan Note (Addendum)
He has intermittent mild pancytopenia due to treatment He is not symptomatic Observe only

## 2023-09-25 ENCOUNTER — Other Ambulatory Visit: Payer: Self-pay

## 2023-09-30 DIAGNOSIS — R931 Abnormal findings on diagnostic imaging of heart and coronary circulation: Secondary | ICD-10-CM | POA: Diagnosis not present

## 2023-09-30 DIAGNOSIS — I517 Cardiomegaly: Secondary | ICD-10-CM | POA: Diagnosis not present

## 2023-09-30 DIAGNOSIS — I5023 Acute on chronic systolic (congestive) heart failure: Secondary | ICD-10-CM | POA: Diagnosis not present

## 2023-09-30 DIAGNOSIS — Z79899 Other long term (current) drug therapy: Secondary | ICD-10-CM | POA: Diagnosis not present

## 2023-09-30 DIAGNOSIS — I088 Other rheumatic multiple valve diseases: Secondary | ICD-10-CM | POA: Diagnosis not present

## 2023-10-03 DIAGNOSIS — I110 Hypertensive heart disease with heart failure: Secondary | ICD-10-CM | POA: Diagnosis not present

## 2023-10-03 DIAGNOSIS — C9002 Multiple myeloma in relapse: Secondary | ICD-10-CM | POA: Diagnosis not present

## 2023-10-03 DIAGNOSIS — I5022 Chronic systolic (congestive) heart failure: Secondary | ICD-10-CM | POA: Diagnosis not present

## 2023-10-03 DIAGNOSIS — I10 Essential (primary) hypertension: Secondary | ICD-10-CM | POA: Diagnosis not present

## 2023-10-06 ENCOUNTER — Other Ambulatory Visit: Payer: Self-pay | Admitting: Hematology and Oncology

## 2023-10-07 ENCOUNTER — Other Ambulatory Visit: Payer: Self-pay

## 2023-10-08 DIAGNOSIS — C9001 Multiple myeloma in remission: Secondary | ICD-10-CM | POA: Diagnosis not present

## 2023-10-08 DIAGNOSIS — Z01818 Encounter for other preprocedural examination: Secondary | ICD-10-CM | POA: Diagnosis not present

## 2023-10-08 DIAGNOSIS — Z5112 Encounter for antineoplastic immunotherapy: Secondary | ICD-10-CM | POA: Diagnosis not present

## 2023-10-08 DIAGNOSIS — R002 Palpitations: Secondary | ICD-10-CM | POA: Diagnosis not present

## 2023-10-08 DIAGNOSIS — C9002 Multiple myeloma in relapse: Secondary | ICD-10-CM | POA: Diagnosis not present

## 2023-10-08 DIAGNOSIS — I5022 Chronic systolic (congestive) heart failure: Secondary | ICD-10-CM | POA: Diagnosis not present

## 2023-10-10 DIAGNOSIS — Z5112 Encounter for antineoplastic immunotherapy: Secondary | ICD-10-CM | POA: Diagnosis not present

## 2023-10-10 DIAGNOSIS — Z79899 Other long term (current) drug therapy: Secondary | ICD-10-CM | POA: Diagnosis not present

## 2023-10-10 DIAGNOSIS — C9002 Multiple myeloma in relapse: Secondary | ICD-10-CM | POA: Diagnosis not present

## 2023-10-15 DIAGNOSIS — C9002 Multiple myeloma in relapse: Secondary | ICD-10-CM | POA: Diagnosis not present

## 2023-10-15 DIAGNOSIS — Z5112 Encounter for antineoplastic immunotherapy: Secondary | ICD-10-CM | POA: Diagnosis not present

## 2023-10-15 DIAGNOSIS — Z79899 Other long term (current) drug therapy: Secondary | ICD-10-CM | POA: Diagnosis not present

## 2023-10-16 DIAGNOSIS — C9002 Multiple myeloma in relapse: Secondary | ICD-10-CM | POA: Diagnosis not present

## 2023-10-16 DIAGNOSIS — Z79899 Other long term (current) drug therapy: Secondary | ICD-10-CM | POA: Diagnosis not present

## 2023-10-16 DIAGNOSIS — I951 Orthostatic hypotension: Secondary | ICD-10-CM | POA: Diagnosis not present

## 2023-10-16 DIAGNOSIS — Z92850 Personal history of chimeric antigen receptor t-cell therapy: Secondary | ICD-10-CM | POA: Diagnosis not present

## 2023-10-16 DIAGNOSIS — Z5112 Encounter for antineoplastic immunotherapy: Secondary | ICD-10-CM | POA: Diagnosis not present

## 2023-10-17 DIAGNOSIS — Z5112 Encounter for antineoplastic immunotherapy: Secondary | ICD-10-CM | POA: Diagnosis not present

## 2023-10-17 DIAGNOSIS — Z92850 Personal history of chimeric antigen receptor t-cell therapy: Secondary | ICD-10-CM | POA: Diagnosis not present

## 2023-10-17 DIAGNOSIS — C9002 Multiple myeloma in relapse: Secondary | ICD-10-CM | POA: Diagnosis not present

## 2023-10-17 DIAGNOSIS — Z79899 Other long term (current) drug therapy: Secondary | ICD-10-CM | POA: Diagnosis not present

## 2023-10-18 DIAGNOSIS — Z5112 Encounter for antineoplastic immunotherapy: Secondary | ICD-10-CM | POA: Diagnosis not present

## 2023-10-18 DIAGNOSIS — Z79899 Other long term (current) drug therapy: Secondary | ICD-10-CM | POA: Diagnosis not present

## 2023-10-18 DIAGNOSIS — C9002 Multiple myeloma in relapse: Secondary | ICD-10-CM | POA: Diagnosis not present

## 2023-10-19 DIAGNOSIS — Z79899 Other long term (current) drug therapy: Secondary | ICD-10-CM | POA: Diagnosis not present

## 2023-10-19 DIAGNOSIS — C9002 Multiple myeloma in relapse: Secondary | ICD-10-CM | POA: Diagnosis not present

## 2023-10-19 DIAGNOSIS — C9000 Multiple myeloma not having achieved remission: Secondary | ICD-10-CM | POA: Diagnosis not present

## 2023-10-19 DIAGNOSIS — Z5112 Encounter for antineoplastic immunotherapy: Secondary | ICD-10-CM | POA: Diagnosis not present

## 2023-10-20 DIAGNOSIS — Z92850 Personal history of chimeric antigen receptor t-cell therapy: Secondary | ICD-10-CM | POA: Diagnosis not present

## 2023-10-20 DIAGNOSIS — C9002 Multiple myeloma in relapse: Secondary | ICD-10-CM | POA: Diagnosis not present

## 2023-10-21 DIAGNOSIS — C9002 Multiple myeloma in relapse: Secondary | ICD-10-CM | POA: Diagnosis not present

## 2023-10-21 DIAGNOSIS — Z5112 Encounter for antineoplastic immunotherapy: Secondary | ICD-10-CM | POA: Diagnosis not present

## 2023-10-21 DIAGNOSIS — R944 Abnormal results of kidney function studies: Secondary | ICD-10-CM | POA: Diagnosis not present

## 2023-10-21 DIAGNOSIS — Z79899 Other long term (current) drug therapy: Secondary | ICD-10-CM | POA: Diagnosis not present

## 2023-10-21 DIAGNOSIS — R519 Headache, unspecified: Secondary | ICD-10-CM | POA: Diagnosis not present

## 2023-10-22 DIAGNOSIS — R944 Abnormal results of kidney function studies: Secondary | ICD-10-CM | POA: Diagnosis not present

## 2023-10-22 DIAGNOSIS — Z79899 Other long term (current) drug therapy: Secondary | ICD-10-CM | POA: Diagnosis not present

## 2023-10-22 DIAGNOSIS — C9002 Multiple myeloma in relapse: Secondary | ICD-10-CM | POA: Diagnosis not present

## 2023-10-22 DIAGNOSIS — Z5112 Encounter for antineoplastic immunotherapy: Secondary | ICD-10-CM | POA: Diagnosis not present

## 2023-10-23 DIAGNOSIS — R944 Abnormal results of kidney function studies: Secondary | ICD-10-CM | POA: Diagnosis not present

## 2023-10-23 DIAGNOSIS — C9002 Multiple myeloma in relapse: Secondary | ICD-10-CM | POA: Diagnosis not present

## 2023-10-23 DIAGNOSIS — R300 Dysuria: Secondary | ICD-10-CM | POA: Diagnosis not present

## 2023-10-23 DIAGNOSIS — Z5112 Encounter for antineoplastic immunotherapy: Secondary | ICD-10-CM | POA: Diagnosis not present

## 2023-10-24 DIAGNOSIS — Z5112 Encounter for antineoplastic immunotherapy: Secondary | ICD-10-CM | POA: Diagnosis not present

## 2023-10-24 DIAGNOSIS — C9002 Multiple myeloma in relapse: Secondary | ICD-10-CM | POA: Diagnosis not present

## 2023-10-24 DIAGNOSIS — Z79899 Other long term (current) drug therapy: Secondary | ICD-10-CM | POA: Diagnosis not present

## 2023-10-25 DIAGNOSIS — Z5112 Encounter for antineoplastic immunotherapy: Secondary | ICD-10-CM | POA: Diagnosis not present

## 2023-10-25 DIAGNOSIS — C9002 Multiple myeloma in relapse: Secondary | ICD-10-CM | POA: Diagnosis not present

## 2023-10-25 DIAGNOSIS — Z79899 Other long term (current) drug therapy: Secondary | ICD-10-CM | POA: Diagnosis not present

## 2023-10-26 DIAGNOSIS — C9002 Multiple myeloma in relapse: Secondary | ICD-10-CM | POA: Diagnosis not present

## 2023-10-27 DIAGNOSIS — Z5112 Encounter for antineoplastic immunotherapy: Secondary | ICD-10-CM | POA: Diagnosis not present

## 2023-10-27 DIAGNOSIS — C9002 Multiple myeloma in relapse: Secondary | ICD-10-CM | POA: Diagnosis not present

## 2023-10-27 DIAGNOSIS — Z79899 Other long term (current) drug therapy: Secondary | ICD-10-CM | POA: Diagnosis not present

## 2023-10-29 ENCOUNTER — Other Ambulatory Visit: Payer: Self-pay

## 2023-10-31 DIAGNOSIS — R42 Dizziness and giddiness: Secondary | ICD-10-CM | POA: Diagnosis not present

## 2023-10-31 DIAGNOSIS — C9000 Multiple myeloma not having achieved remission: Secondary | ICD-10-CM | POA: Diagnosis not present

## 2023-10-31 DIAGNOSIS — I5189 Other ill-defined heart diseases: Secondary | ICD-10-CM | POA: Diagnosis not present

## 2023-11-01 DIAGNOSIS — I519 Heart disease, unspecified: Secondary | ICD-10-CM | POA: Diagnosis not present

## 2023-11-01 DIAGNOSIS — I358 Other nonrheumatic aortic valve disorders: Secondary | ICD-10-CM | POA: Diagnosis not present

## 2023-11-01 DIAGNOSIS — I493 Ventricular premature depolarization: Secondary | ICD-10-CM | POA: Diagnosis not present

## 2023-11-01 DIAGNOSIS — I491 Atrial premature depolarization: Secondary | ICD-10-CM | POA: Diagnosis not present

## 2023-11-01 DIAGNOSIS — I517 Cardiomegaly: Secondary | ICD-10-CM | POA: Diagnosis not present

## 2023-11-01 DIAGNOSIS — C9000 Multiple myeloma not having achieved remission: Secondary | ICD-10-CM | POA: Diagnosis not present

## 2023-11-01 DIAGNOSIS — I5189 Other ill-defined heart diseases: Secondary | ICD-10-CM | POA: Diagnosis not present

## 2023-11-01 NOTE — Discharge Summary (Signed)
 ------------------------------------------------------------------------------- Attestation signed by Crisoforo Aspen, MD at 11/01/2023  5:41 PM ATTENDING ATTESTATION:   I have seen and examined  Bradley Hunt with Dr. Karilyn Charon and concur.  The note accurately reflects the history, exam, assessment and the plans generated with me.   We reviewed the events of the past day, vital signs, medications, labs and pertinent imaging results.  I have discussed today's plan with the multidisciplinary team.  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  Patient endorsed that he has been intermittently dizzy especially in the evenings for the last few days.  Was hypotensive and bradycardic in clinic today but asymptomatic.  Patient did receive after discussion with cardiology, a 250 mL bolus with improvement in blood pressure parameters.  Overnight, did well. BP holding.  D/w cardiology (patient will be  seen as outpatient in clinic by them for GDMT re-introduction); for now ok to discharge on toprol xl 25mg  every day, continue aldactone and continue to hold Entresto. Thus far no evidence of ICANS or CRS  Patietn is asymptomatic and stable for discharge. Today is D+17.  We reviewed discharge plans and followup schedule with patient.  He was given an opportunity to ask questions.  We spent about 25 min making discharge arrangements, follow up appointments, reconciling medications and counseling patient regarding medications and outpatient care expectations.      Electronically Signed by: Crisoforo Aspen, MD, FACP Attending Physician - Hematology/Medical Oncology 11/01/2023 5:38 PM  This document was created using the aid of voice recognition Dragon dictation software.  Kindly excuse any sound alike  substitutions, typographic, or transcription errors. Efforts have been made to correct these errors.  However, some may persist, and this does not reflect the standard of medical care. Please feel free to reach out to me for any clarifications.   -------------------------------------------------------------------------------  BMT Discharge Summary  Name: Bradley Hunt MRN: [id number] Age: 53 yrs DOB: Jun 24, 1970  Admit date: 10/31/2023 Discharge date: Sat 11/01/2023   Admitting Physician: Crisoforo Aspen, MD Discharge Physician: Crisoforo Aspen, MD  Admission Diagnoses:  Multiple myeloma not having achieved remission    (CMD) [C90.00]   Discharge Diagnoses:  Hypotension  Admission Condition: good Discharged Condition: good  Hospital Course:  For full details, please see H&P, progress notes, consult notes and ancillary notes. Briefly, Bradley Hunt is a 53 y.o. Black or African American [3] male with a history of lambda light chain multiple myeloma, status post CAR-T therapy (Carvykti) on 10/15/2023 following Fludarabine/Cytoxan  lymphodepletion, was seen in clinic on 10/31/2023 (Day +16 post-infusion) for follow-up. During the visit, he was noted to be hypotensive and bradycardic. He received a 250 mL IV fluid bolus with subsequent normalization of blood pressure and heart rate.  Cardiology was consulted and recommended holding Entresto and initiating metoprolol succinate 25 mg daily on discharge. Inpatient ECHO demonstrated left ventricular systolic function slightly less vigorous (EF 35-40%) compared to prior study on 09/30/2023, though overall findings were similar. Patient remained asymptomatic throughout hospitalization.  On day of discharge, patient is clinically stable with no new examination findings or acute symptoms compared to prior.  The patient was seen by the attending physician on the date of discharge and deemed stable and acceptable for discharge.  The patient's chronic  medical conditions were treated accordingly per the patient's home medication regimen.  The  patient's medication reconciliation [with changes made to chronic medications], follow-up appointments, discharge orders, instructions and significant lab and diagnostic studies are as noted.    Discharge Follow-up Action Items: Per cardiology recommendations who will arrange outpatient f/u:  HOLD Entresto  Start metoprolol succinate 25mg  daily Continue aldactone    Physical Exam Sat 11/01/2023 CONSTITUTIONAL: well developed, well nourished, no acute distress HEENT: NCAT, EOMI NECK: soft and supple, normal ROM CARDIOVASCULAR: RRR, normal S1 and S2, no murmurs PULMONARY: CTAB, normal effort, no wheezing ABDOMINAL: soft, non-tender, non-distended NEUROLOGICAL: awake, alert and oriented x3, no focal neurologic deficits MUSCULOSKELETAL: normal ROM, no LE edema SKIN: warm and dry, no rashes PSYCH: normal mood and affect   Patient's Ordered Code Status: Full Code  Consults: IP CONSULT TO CARDIOLOGY  Significant Diagnostic Studies:  CBC: Results from last 7 days  Lab Units 11/01/23 0556 10/31/23 1301  WHITE BLOOD CELL COUNT 10*3/uL 2.70* 2.40*  HEMOGLOBIN g/dL 86.5* 86.8*  HEMATOCRIT % 38.4* 37.3*  PLATELET COUNT 10*3/uL 143* 129*   DIFF: Results from last 7 days  Lab Units 11/01/23 0556  NEUTROPHILS RELATIVE PERCENT % 28  LYMPHOCYTES RELATIVE PERCENT % 43  MONOCYTES RELATIVE PERCENT % 27  BASOPHILS RELATIVE PERCENT % 1  EOSINOPHILS RELATIVE PERCENT % 2  NRBC % % 0  NEUTROPHILS ABSOLUTE COUNT 10*3/uL 0.80*  LYMPHOCYTES ABSOLUTE COUNT 10*3/uL 1.20  MONOCYTES ABSOLUTE COUNT 10*3/uL 0.70  BASOPHILS ABSOLUTE COUNT 10*3/uL 0.00  EOSINOPHILS ABSOLUTE COUNT 10*3/uL 0.00   CMP: Results from last 7 days  Lab Units 11/01/23 0556 10/31/23 1301  SODIUM mmol/L 138 137  POTASSIUM mmol/L 4.2 4.6  CHLORIDE mmol/L 104 106  CO2 mmol/L 26 25  BUN mg/dL 15 18  CREATININE mg/dL 8.87 8.85   CALCIUM  mg/dL 89.9 9.8  MAGNESIUM mg/dL 2.0 2.1  PHOSPHORUS mg/dL 3.9 3.6  BILIRUBIN TOTAL mg/dL 0.5 0.4  AST U/L 16 17  ALT U/L 21 21  TOTAL PROTEIN g/dL 6.5 6.7  ALBUMIN g/dL 4.2 4.4  ANION GAP mmol/L 8 6   COAGS: Results from last 7 days  Lab Units 10/31/23 1301 10/29/23 1242  APTT seconds 25.4 21.8  INR  0.9 0.9   BLOOD GAS:    Invalid input(s): BDF, O2S CARDIAC: Results from last 7 days  Lab Units 10/31/23 1310  BNP pg/mL <50   HEMOGLOBINA1C:Results in Past 30 Days Result Component Current Result Ref Range Previous Result Ref Range  Hemoglobin A1c 5.8 (H) (10/24/2023) <5.7 % Not in Time Range    PANCREAS:   THYROID:    Invalid input(s): FT4  IMAGING:  Transthoracic echo (TTE) complete  Final Result by Elveria Cy Argyle, MD (10/03 1732)                                                    [facility]                                                  [facility] [place]                                                     [id number]                                   Transthoracic Echocardiogram Report  Name  MINA, CARLISI                               Study Date  10-31-2023              Height  72 in  MRN  [id number]                                      Patient Location    [id number]         Weight  237 lb  DOB  1970/03/03                                    Gender  Male                        BSA  2.3 m2  Age  67 yrs  Ethnicity  3                        BP  126-88 mmHg  Reason For Study  dizziness                                                             HR  57  Ordering Physician  HOOKER, MELANIE BROOKE         Performed By  ERVING PRIDE  Referring Physician  HOOKER, MELANIE BROOKE  -  -  PROCEDURE  A two-dimensional transthoracic echocardiogram with color flow and Doppler   was performed. A  injection of Optison  contrast agent was performed to improve image   quality.  -  SUMMARY  The left ventricular size is normal with normal left ventricular wall   thickness.  Left ventricular systolic function is mild to moderately reduced.  LV ejection fraction = 35-40%.  There is mild global hypokinesis of the left ventricle.  Left ventricular filling pattern is prolonged relaxation.  The right ventricle is normal in size and function.  Aortic sinus is mildly dilated at 4.1 cm.  There is no significant valvular stenosis or regurgitation.  The IVC is normal in size with an inspiratory collapse of greater than   50%, suggesting normal right  atrial pressure.  There is no pericardial effusion.  Compared to the last study dated 09-30-23, LV systolic function is slightly   less vigorous but overall  similar and differences may be due to differences in study quality.   Recommend routine surveillance  for mild aortic root dilation.  -  FINDINGS   LEFT VENTRICLE  The left ventricular size is normal with normal left ventricular wall   thickness. LV Global L Strain  =-10.9%. LV ejection fraction = 35-40%. Left ventricular systolic function   is mild to moderately  reduced. Left ventricular filling pattern is prolonged relaxation. There   is mild global hypokinesis  of the left ventricle.  -  RIGHT VENTRICLE  The right ventricle is normal in size and function.  LEFT ATRIUM  The left atrial size is normal.  RIGHT ATRIUM  Right atrial size is normal.  -  AORTIC VALVE  The aortic valve is trileaflet. There is trivial aortic valve thickening.   There is no aortic  stenosis. There is trace aortic regurgitation.  -   MITRAL VALVE  The mitral valve leaflets appear normal. There is trace mitral   regurgitation.  -  TRICUSPID VALVE  Structurally normal tricuspid valve. There is trace tricuspid   regurgitation.  -  PULMONIC VALVE  The pulmonic valve is not well visualized. Trace pulmonic valvular   regurgitation.  -  ARTERIES  Aortic sinus is mildly dilatd at 4.1 cm. The ascending aorta is normal   size.  -  VENOUS  Pulmonary venous flow pattern is normal. The IVC is normal in size with an   inspiratory collapse of  greater than 50%, suggesting normal right atrial pressure.  -  EFFUSION  There is no pericardial effusion.  -  -  MMode-2D Measurements & Calculations  IVSd  0.82 cm        EDV MOD-sp4   221.5 ml       ESV MOD-sp4  Ao   sinus diam  4.1 cm  LVIDd  5.3 cm                                     166.9 ml  LVPWd  0.92 cm                                    EDV MOD-sp2    LVIDs  4.4 cm                                     182.6 ml                                                    ESV MOD-sp2                                                      113.2 ml              ___________________________________________________________________________  ______  asc Aorta Diam       LVOT diam  2.5 cm            SV MOD-sp4   54.7 ml    Indexed LVEDV  4Ch    3.9 cm                                            SI MOD-sp4              96.7 ml-m2                                                    23.9 ml-m2              ___________________________________________________________________________  ______  IVC 1  1.4 cm        LA Vol Indexed  MOD          LAVol MOD-bp            LAVol MOD-sp2   41.2 ml                       17.4 ml-m2                   39.9 ml              ___________________________________________________________________________  ______  LAVol MOD-sp4                                     TAPSE  2.6 cm  37.6 ml  RA area A4  13.3 cm2  Doppler Measurements &  Calculations  MV E max vel            MV dec time  0.34 sec         SV LVOT   69.1 ml        LV V1 VTI  14.1 cm  33.2 cm-sec                                           Ao V2 max  99.2   cm-sec  MV A max vel                                          Ao max PG  3.9 mmHg  68.0 cm-sec                                           Ao V2 mean  74.8   cm-sec  MV E-A  0.49                                          Ao mean PG  2.4 mmHg  Med Peak E  Vel                                       Ao V2 VTI  17.9 cm  4.3 cm-sec  Lat Peak E  Vel                                       AVA  VTI   3.9 cm2  3.1 cm-sec  E-Lat E`  10.7  E-Med E`  7.8              ___________________________________________________________________________  ______  RAP systole  8.0 mmHg   AS Dimensionless Index  VTI   AVAi VTI  cm^2-m^2       RV Peak S Vel                           0.79                                                   15.3 cm-sec                                                        1.7 cm2              ___________________________________________________________________________  ______  SV index LVOT    30.2 ml-m2  ___________________________________________________________________________  ___  Reading  Physician                     MD Elveria Cy Argyle, MD, [phone] 10-31-2023 05 32 PM      Disposition: Final discharge disposition not confirmed   Patient Instructions:    Medication List     PAUSE taking these medications    carvediloL 3.125 mg tablet Wait to take this until your doctor or other care provider tells you to start again. Commonly known as: COREG Take 1 tablet (3.125 mg total) by mouth in the morning and 1 tablet (3.125 mg total) in the evening. Take with meals.   Entresto 49-51 mg per tablet Wait to take this until your doctor or other care provider tells you to start again. Generic drug: sacubitriL-valsartan Take 1 tablet by mouth 2 (two) times a day.       START taking these  medications    metoprolol succinate 25 mg 24 hr tablet Commonly known as: TOPROL XL Take 1 tablet (25 mg total) by mouth daily.       CHANGE how you take these medications    calcium  carbonate 500 mg (200 mg calcium ) chewable tablet Commonly known as: TUMS Take 2 tablets by mouth Once Daily. What changed: when to take this       CONTINUE taking these medications    acyclovir  400 mg tablet Commonly known as: ZOVIRAX  Take 2 tablets (800 mg total) by mouth 2 (two) times a day.   cholecalciferol 1,000 unit (25 mcg) tablet Commonly known as: VITAMIN D3 Take 1,000 Units by mouth daily.   empagliflozin 10 mg Tab Commonly known as: JARDIANCE Take 1 tablet (10 mg total) by mouth daily.   entecavir 0.5 mg Tab tablet Commonly known as: BARACLUDE Take 1 tablet (0.5 mg total) by mouth daily.   fluconazole 200 mg tablet Commonly known as: DIFLUCAN Take 2 tablets (400 mg total) by mouth daily.   levETIRAcetam 750 mg tablet Commonly known as: KEPPRA Take 1 tablet (750 mg total) by mouth 2 (two) times a day. Continue until Day +30 after infusion of Car-T cells   levoFLOXacin 500 mg tablet Commonly known as: LEVAQUIN Take 1 tablet (500 mg total) by mouth daily.   ondansetron  8 mg tablet Commonly known as: ZOFRAN  Take 1 tablet (8 mg total) by mouth every 8 (eight) hours as needed for nausea or vomiting.   prochlorperazine  10 mg tablet Commonly known as: COMPAZINE  Take 1 tablet (10 mg total) by mouth every 6 (six) hours as needed for nausea or vomiting.   spironolactone 25 mg tablet Commonly known as: ALDACTONE Take 1 tablet (25 mg total) by mouth daily.   traMADoL 50 mg tablet Commonly known as: ULTRAM Take half tablets (25 mg total) by mouth every 8 (eight) hours as needed for moderate pain  or severe pain (Take 0.5 tablets (25 mg total) by mouth every 8 (eight) hours as needed for moderate pain (4-6) or severe pain (7-10).)         Where to Get Your Medications      These medications were sent to [facility] [place] [address]    Hours: Open Monday 12am to Friday 11:59pm; Sat-Sun: Closed; Holidays: Closed Thanksgiving Phone: [phone]  metoprolol succinate 25 mg 24 hr tablet      Follow-up  Appointments which have been scheduled for you    Nov 02, 2023 12:30 PM Video Visit with BMT INFUSION OUTPATIENT [facility] - Outpatient BMT Infusion [facility]) [facility] [place] [address] [phone]  Reminder: Internet Explorer is not supported. Use Google Chrome, Microsoft [place], or [place]. Call [phone] for help.     Nov 03, 2023 12:30 PM Video Visit with BMT INFUSION OUTPATIENT [facility] - Outpatient B[facility] Infusion [facility]) [facility] [place] [address] [phone]  Reminder: Internet Explorer is not supported. Use Google Chrome, Microsoft [place], or [place]. Call [phone] for help.     Nov 04, 2023 12:30 PM Office Visit with BMT INFUSION OUTPATIENT [facility] - Outpatient BMT Infusion [facility]) [facility] [place] [address] [phone]  Please arrive 15 minutes prior to your scheduled visit.     Nov 05, 2023 12:30 PM Video Visit with BMT INFUSION OUTPATIENT [facility] - Outpatient B[facility] Infusion [facility]) [facility] [place] [address] [phone]  Reminder: Internet Explorer is not supported. Use Google Chrome, Microsoft [place], or [place]. Call [phone] for help.     Nov 06, 2023 12:30 PM Video Visit with BMT INFUSION OUTPATIENT [facility] - Outpatient BMT Infusion [facility]) [facility] [place] [address] [phone]  Reminder:  Internet Explorer is not supported. Use Google Chrome, Microsoft [place], or [place]. Call [phone] for help.     Nov 07, 2023 12:30 PM Office Visit with BMT INFUSION OUTPATIENT [facility] - Outpatient BMT Infusion [facility]) [facility] [place] [address] [phone]  Please arrive 15 minutes prior to your scheduled visit.     Nov 08, 2023 12:30 PM Video Visit with BMT INFUSION OUTPATIENT [facility] - Outpatient BMT Infusion [facility]) [facility] [place] [address] [phone]  Reminder: Internet Explorer is not supported. Use Google Chrome, Microsoft [place], or [place]. Call [phone] for help.     Nov 09, 2023 12:30 PM Video Visit with BMT INFUSION OUTPATIENT [facility] - Outpatient BMT Infusion [facility]) [facility] [place] [address] [phone]  Reminder: Internet Explorer is not supported. Use Google Chrome, Microsoft [place], or [place]. Call [phone] for help.     Nov 10, 2023 12:30 PM Video Visit with BMT INFUSION OUTPATIENT [facility] - Outpatient BMT Infusion [facility]) [facility] [place] [address] [phone]  Reminder: Internet Explorer is not supported. Use Google Chrome, Microsoft [place], or [place]. Call [phone] for help.     Nov 11, 2023 12:30 PM Office Visit with BMT INFUSION OUTPATIENT [facility] - Outpatient BMT Infusion [facility]) [facility] [place] [address] [phone]  Please arrive 15 minutes prior to your scheduled visit.     Nov 12, 2023 12:30 PM Video Visit with BMT INFUSION OUTPATIENT [facility] - Outpatient BMT Infusion [facility]) [facility] [place] [address] [phone]  Reminder: Internet Explorer is not supported. Use Google Chrome, Microsoft [place], or [place]. Call [phone] for help.     Nov 13, 2023 12:30 PM Video Visit with BMT INFUSION OUTPATIENT [facility] - Outpatient BMT Infusion [facility]) [facility] [place] [address] [phone]  Reminder: Internet Explorer is not supported. Use  Google [place], Microsoft [place], or [place]. Call [phone] for help.     Nov 14, 2023 10:00 AM Catheter Care with CHERIE PITCH 03 CATH CARE 4 [facility] [place] Hematology Oncology [facility] Comprehensive [facility]) [facility] [place] [address] [phone]     Nov 14, 2023 10:30 AM Office Visit with CHERIE PITCH 03 BMT 2 [facility] [place] Hematology Oncology [facility]) [facility] [place] [address] [phone]  Please arrive 15 minutes prior to your scheduled visit.     Jan 05, 2024 10:30 AM Office Visit with Rosina Blondie Ahle, PA-C [facility] - Cardiology [facility] [place] [address] [phone]  Please arrive 15 minutes prior to your scheduled visit.     Jan 27, 2024 9:40 AM PET INJECTION with [facility] PETINJ1 [facility] - RADIOLOGY PET RT [facility] [place] [address] [phone]     Jan 27, 2024 10:40 AM Pet/Ct Whole Body with [facility] PET1 [facility] - RADIOLOGY PET RT [facility]) [facility] [place] [address] [phone]  Please restrict strenuous physical activity for 48 hours prior to the appointment. Eat a low-carbohydrate diet for 24 hours prior to the PET scan  appointment. Nothing by mouth except water and your medications for 6 hours prior to the PET scan appointment. Diabetic patients should check their blood sugar prior to arriving to the PET department.  If your blood sugar is 250mg /dl or higher call to reschedule. Do not take insulin during the six hours leading up to the appointment.    Jan 27, 2024 12:30 PM LAB with HEM ONC 03 LAB [facility] [place] Hematology Oncology [facility]) [facility] [place] [address] [phone]  Please arrive 15 minutes prior to your scheduled appointment.      Jan 27, 2024 1:00 PM Office Visit with Dorn Dallas Ehrlich, MD [facility] [place] Hematology Oncology [facility]) [facility] [place] [address] [phone]  Please arrive 15 minutes prior to your scheduled visit.     Jan 27, 2024 2:30 PM Procedure with HEM ONC 03 PROCEDURE ROOM [facility] - [place] Hematology Oncology [facility]) [facility] [place] [address] [phone]     Jan 27, 2024 2:30 PM Procedure with CHERIE PITCH 03 BMT 1 [facility] [place] Hematology Oncology [facility]) [facility] [place] [address] [phone]     Apr 13, 2024 9:40 AM PET INJECTION with [facility] PETINJ1 [facility] - RADIOLOGY PET RT [facility] [place] [address] [phone]     Apr 13, 2024 10:40 AM Pet/Ct Whole [facility] PET1 [facility] - RADIOLOGY PET RT [facility] [place] [address] [phone]  Please restrict strenuous physical activity for 48 hours prior to the appointment. Eat a low-carbohydrate diet for 24 hours prior to  the PET scan appointment. Nothing by mouth except water and your medications for 6 hours prior to the PET scan appointment. Diabetic patients should check their blood sugar prior to  arriving to the PET department.  If your blood sugar is 250mg /dl or higher call to reschedule. Do not take insulin during the six hours leading up to the appointment.    Apr 13, 2024 12:30 PM LAB with HEM ONC 03 LAB [facility] [place] [facility] Hematology Oncology [facility] [place] [address] [phone]  Please arrive 15 minutes prior to your scheduled appointment.      Apr 13, 2024 1:00 PM Office Visit with CHERIE PITCH 03 BMT 2 [facility] [place] [facility] Hematology Oncology [facility] [place] [address] [phone]  Please arrive 15 minutes prior to your scheduled visit.     Oct 19, 2024 12:30 PM LAB with HEM ONC 03 LAB [facility] [place] 96 Hematology Oncology [facility] [place] [address] [phone]  Please arrive 15 minutes prior to your scheduled appointment.      Oct 19, 2024 1:00 PM Office Visit with Dorn Dallas Ehrlich, MD [facility] [place] [facility] Hematology Oncology [facility] [place] [address] [phone]  Please arrive 15 minutes prior to your scheduled visit.         To request medical records from this hospitalization, please refer to [email or web address] or call [phone].   Electronically signed by: Karilyn Rosaline Charon, MD 11/01/2023  Hematology and Medical Oncology Fellow, PGY-5 [facility]

## 2023-11-03 ENCOUNTER — Telehealth: Payer: Self-pay

## 2023-11-03 NOTE — Transitions of Care (Post Inpatient/ED Visit) (Signed)
   11/03/2023  Name: Bradley Hunt MRN: [id number] DOB: 09-Apr-1970  Today's TOC FU Call Status: Today's TOC FU Call Status:: Unsuccessful Call (1st Attempt) Unsuccessful Call (1st Attempt) Date: 11/03/23  Attempted to reach the patient regarding the most recent Inpatient/ED visit.  Follow Up Plan: Additional outreach attempts will be made to reach the patient to complete the Transitions of Care (Post Inpatient/ED visit) call.   Arvin Seip RN, BSN, CCM [employer], Population Health Case Manager Phone: [phone]

## 2023-11-04 ENCOUNTER — Telehealth: Payer: Self-pay

## 2023-11-04 ENCOUNTER — Other Ambulatory Visit: Payer: Self-pay

## 2023-11-04 DIAGNOSIS — C9002 Multiple myeloma in relapse: Secondary | ICD-10-CM | POA: Diagnosis not present

## 2023-11-04 DIAGNOSIS — R112 Nausea with vomiting, unspecified: Secondary | ICD-10-CM | POA: Diagnosis not present

## 2023-11-04 DIAGNOSIS — Z5112 Encounter for antineoplastic immunotherapy: Secondary | ICD-10-CM | POA: Diagnosis not present

## 2023-11-04 DIAGNOSIS — T451X5A Adverse effect of antineoplastic and immunosuppressive drugs, initial encounter: Secondary | ICD-10-CM | POA: Diagnosis not present

## 2023-11-04 NOTE — Transitions of Care (Post Inpatient/ED Visit) (Signed)
 11/04/2023  Name: Bradley Hunt MRN: [id number] DOB: 11-Sep-1970  Today's TOC FU Call Status: Today's TOC FU Call Status:: Successful TOC FU Call Completed TOC FU Call Complete Date: 11/04/23 Patient's Name and Date of Birth confirmed.  Transition Care Management Follow-up Telephone Call Date of Discharge: 11/01/23 Discharge Facility: Other ([facility]) Name of Other ([facility]) [facility]charge Facility: [facility] Type of Discharge: Inpatient Admission Primary Inpatient Discharge Diagnosis:: mulitiple myeloma / hypotension How have you been since you were released from the hospital?: Better Any questions or concerns?: No  Items Reviewed: Did you receive and understand the discharge instructions provided?: Yes Medications obtained,verified, and reconciled?: Yes (Medications Reviewed) Any new allergies since your discharge?: No Dietary orders reviewed?: Yes Type of Diet Ordered:: regular Do you have support at home?: Yes People in Home [RPT]: sibling(s) Name of Support/Comfort Primary Source: Amina Amoudou  Medications Reviewed Today: Medications Reviewed Today     Reviewed by Victoria Henshaw E, RN (Registered Nurse) on 11/04/23 at 1519  Med List Status: <None>   Medication Order Taking? Sig Documenting Provider Last Dose Status Informant  acyclovir  (ZOVIRAX ) 400 MG tablet [id number] Yes Take 1 tablet (400 mg total) by mouth daily.  Patient taking differently: Take 400 mg by mouth daily. Takes 2 tablets2 times per day   Lonn Hicks, MD  Active   aspirin EC 81 MG tablet [id number]  Take 81 mg by mouth daily. Swallow whole.  Patient not taking: Reported on 11/04/2023   [provider]  Active   calcium  carbonate (TUMS - DOSED IN MG ELEMENTAL CALCIUM ) 500 MG chewable tablet [id number] Yes Chew 1 tablet by mouth 3 (three) times daily. [provider]  Active Self  cholecalciferol (VITAMIN D3) 25 MCG (1000 UT) tablet [id number] Yes Take 1,000 Units by mouth daily.  [provider]  Active Self  entecavir (BARACLUDE) 0.5 MG tablet [id number] Yes Take 0.5 mg by mouth daily. [provider]  Active   fluconazole (DIFLUCAN) 200 MG tablet [id number] Yes Take 200 mg by mouth daily. Patient states he takes 2 tablets daily [provider]  Active   lenalidomide  (REVLIMID ) 25 MG capsule [id number]  TAKE 1 CAPSULE BY MOUTH 1 TIME A DAY FOR 21 DAYS ON THEN 7 DAYS OFF  Patient not taking: Reported on 11/04/2023   Lonn Hicks, MD  Active   levETIRAcetam (KEPPRA) 750 MG tablet [id number] Yes Take 750 mg by mouth 2 (two) times daily. [provider]  Active   levofloxacin (LEVAQUIN) 500 MG tablet [id number] Yes Take 500 mg by mouth daily. [provider]  Active   losartan  (COZAAR ) 50 MG tablet [id number]  TAKE 1 TABLET(50 MG) BY MOUTH DAILY  Patient not taking: Reported on 11/04/2023   Lonn Hicks, MD  Active   ondansetron  (ZOFRAN ) 8 MG tablet [id number] Yes Take 1 tablet (8 mg total) by mouth every 8 (eight) hours as needed for nausea or vomiting. Lonn Hicks, MD  Active   pantoprazole  (PROTONIX ) 40 MG tablet [id number]  Take 1 tablet (40 mg total) by mouth daily.  Patient not taking: Reported on 11/04/2023   Lonn Hicks, MD  Active   prochlorperazine  (COMPAZINE ) 10 MG tablet [id number] Yes Take 1 tablet (10 mg total) by mouth every 6 (six) hours as needed for nausea or vomiting. Lonn Hicks, MD  Active   Med List Note Gilford Dagoberto CROME, CPhT 01/13/19 1024): Pomalyst - send to [facility].              Home  Care and Equipment/Supplies: Were Home Health Services Ordered?: No Any new equipment or medical supplies ordered?: No  Functional Questionnaire: Do you need assistance with bathing/showering or dressing?: No Do you need assistance with meal preparation?: No Do you need assistance with eating?: No Do you have difficulty maintaining continence: No Do you need assistance with getting out of bed/getting out of a chair/moving?:  No Do you have difficulty managing or taking your medications?: No  Follow up appointments reviewed: PCP Follow-up appointment confirmed?: No (offered to schedule hospital follow up. Patient states he will call.) Specialist Hospital Follow-up appointment confirmed?: Yes Date of Specialist follow-up appointment?: 11/05/23 Follow-Up Specialty Provider:: Dr. Mark Do you need transportation to your follow-up appointment?: No Do you understand care options if your condition(s) worsen?: Yes-patient verbalized understanding  SDOH Interventions Today    Flowsheet Row Most Recent Value  SDOH Interventions   Food Insecurity Interventions Intervention Not Indicated  Housing Interventions Intervention Not Indicated  Transportation Interventions Intervention Not Indicated  Utilities Interventions Intervention Not Indicated   Discussed and offered 30 day TOC program.  Patient  declined.  The patient has been provided with contact information for the care management team and has been advised to call with any health -related questions or concerns.  The patient verbalized understanding with current plan of care.  The patient is directed to their insurance card regarding availability of benefits coverage.    Arvin Seip RN, BSN, CCM [employer], Population Health Case Manager Phone: [phone]

## 2023-11-04 NOTE — Patient Instructions (Signed)
 Visit Information  Thank you for taking time to visit with me today. Please don't hesitate to contact me if I can be of assistance to you   Patient instructions  continue monitoring blood pressures daily and recording.  report abnormal blood pressure readings to provider.  take his medications and prescribed.  schedule hospital follow up visit with his primary care provider.  notify provider of any new/ ongoing symptoms and/ or call 911 for severe symptoms.   Patient verbalizes understanding of instructions and care plan provided today and agrees to view in MyChart. Active MyChart status and patient understanding of how to access instructions and care plan via MyChart confirmed with patient.     The patient has been provided with contact information for the care management team and has been advised to call with any health related questions or concerns.   Please call the care guide team at [phone] if you need to cancel or reschedule your appointment.   Please call the Suicide and Crisis Lifeline: 988 call the [place]  National Suicide Prevention Lifeline: 854-495-8815 or TTY: 623-060-2499 TTY 725-732-6764) to talk to a trained counselor call 1-800-273-TALK (toll free, 24 hour hotline) if you are experiencing a Mental Health or Behavioral Health Crisis or need someone to talk to.  Arvin Seip RN, BSN, CCM [employer], Population Health Case Manager Phone: [phone]

## 2023-11-07 DIAGNOSIS — C9002 Multiple myeloma in relapse: Secondary | ICD-10-CM | POA: Diagnosis not present

## 2023-11-07 DIAGNOSIS — R519 Headache, unspecified: Secondary | ICD-10-CM | POA: Diagnosis not present

## 2023-11-07 DIAGNOSIS — I951 Orthostatic hypotension: Secondary | ICD-10-CM | POA: Diagnosis not present

## 2023-11-07 DIAGNOSIS — R944 Abnormal results of kidney function studies: Secondary | ICD-10-CM | POA: Diagnosis not present

## 2023-11-07 DIAGNOSIS — Z5112 Encounter for antineoplastic immunotherapy: Secondary | ICD-10-CM | POA: Diagnosis not present

## 2023-11-07 DIAGNOSIS — Z79899 Other long term (current) drug therapy: Secondary | ICD-10-CM | POA: Diagnosis not present

## 2023-11-12 ENCOUNTER — Other Ambulatory Visit: Payer: Self-pay

## 2023-11-13 ENCOUNTER — Other Ambulatory Visit: Payer: Self-pay

## 2023-12-02 ENCOUNTER — Other Ambulatory Visit: Payer: Self-pay | Admitting: Hematology and Oncology

## 2023-12-02 ENCOUNTER — Encounter: Payer: Self-pay | Admitting: Hematology and Oncology

## 2023-12-09 ENCOUNTER — Inpatient Hospital Stay: Attending: Internal Medicine | Admitting: Hematology and Oncology

## 2023-12-09 ENCOUNTER — Inpatient Hospital Stay

## 2023-12-09 ENCOUNTER — Encounter: Payer: Self-pay | Admitting: Hematology and Oncology

## 2023-12-09 VITALS — BP 142/86 | HR 87 | Temp 98.5°F | Resp 18 | Ht 74.0 in | Wt 251.8 lb

## 2023-12-09 DIAGNOSIS — C9002 Multiple myeloma in relapse: Secondary | ICD-10-CM | POA: Diagnosis not present

## 2023-12-09 DIAGNOSIS — D61818 Other pancytopenia: Secondary | ICD-10-CM | POA: Diagnosis not present

## 2023-12-09 DIAGNOSIS — I10 Essential (primary) hypertension: Secondary | ICD-10-CM | POA: Diagnosis not present

## 2023-12-09 LAB — COMPREHENSIVE METABOLIC PANEL WITH GFR
ALT: 23 U/L (ref 0–44)
AST: 17 U/L (ref 15–41)
Albumin: 4.4 g/dL (ref 3.5–5.0)
Alkaline Phosphatase: 54 U/L (ref 38–126)
Anion gap: 6 (ref 5–15)
BUN: 15 mg/dL (ref 6–20)
CO2: 28 mmol/L (ref 22–32)
Calcium: 9.7 mg/dL (ref 8.9–10.3)
Chloride: 105 mmol/L (ref 98–111)
Creatinine, Ser: 1.07 mg/dL (ref 0.61–1.24)
GFR, Estimated: >60 mL/min (ref >60)
Glucose, Bld: 114 mg/dL — ABNORMAL HIGH (ref 70–99)
Potassium: 3.6 mmol/L (ref 3.5–5.1)
Sodium: 139 mmol/L (ref 135–145)
Total Bilirubin: 0.7 mg/dL (ref 0.0–1.2)
Total Protein: 6.8 g/dL (ref 6.5–8.1)

## 2023-12-09 LAB — CBC WITH DIFFERENTIAL/PLATELET
Abs Immature Granulocytes: 0.03 K/uL (ref 0.00–0.07)
Basophils Absolute: 0 K/uL (ref 0.0–0.1)
Basophils Relative: 0 %
Eosinophils Absolute: 0.1 K/uL (ref 0.0–0.5)
Eosinophils Relative: 2 %
HCT: 35.7 % — ABNORMAL LOW (ref 39.0–52.0)
Hemoglobin: 12.9 g/dL — ABNORMAL LOW (ref 13.0–17.0)
Immature Granulocytes: 1 %
Lymphocytes Relative: 34 %
Lymphs Abs: 1.2 K/uL (ref 0.7–4.0)
MCH: 32.9 pg (ref 26.0–34.0)
MCHC: 36.1 g/dL — ABNORMAL HIGH (ref 30.0–36.0)
MCV: 91.1 fL (ref 80.0–100.0)
Monocytes Absolute: 0.6 K/uL (ref 0.1–1.0)
Monocytes Relative: 16 %
Neutro Abs: 1.6 K/uL — ABNORMAL LOW (ref 1.7–7.7)
Neutrophils Relative %: 47 %
Platelets: 137 K/uL — ABNORMAL LOW (ref 150–400)
RBC: 3.92 MIL/uL — ABNORMAL LOW (ref 4.22–5.81)
RDW: 14 % (ref 11.5–15.5)
WBC: 3.4 K/uL — ABNORMAL LOW (ref 4.0–10.5)
nRBC: 0 % (ref 0.0–0.2)

## 2023-12-09 NOTE — Progress Notes (Signed)
  Cancer Center OFFICE PROGRESS NOTE  Patient Care Team: Sim [name] CROME, MD as PCP - General (Internal Medicine)  Assessment & Plan Multiple myeloma in relapse [facility]) He was diagnosed with IgG kappa multiple myeloma in 2017 after presentation with lumbar fracture.   Pathology: BM biopsy showed 50% involvement; Cytogenetics 46XY, positive for 13q-  He received combination chemotherapy with Velcade , lenalidomide  and dexamethasone , followed by change of treatment to carfilzomib , Cytoxan  and dexamethasone .  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  He received dexamethasone , pomalidomide  and daratumumab  followed by maintenance pomalidomide , discontinued in 2021  He has disease relapse in October 2023 and was restarted back on treatment with daratumumab , pomalidomide  and dexamethasone  with partial response.  Treatment was then switched to lenalidomide , bortezomib  and dexamethasone  in April 2024 with VGPR  He will continue lenalidomide  and Velcade  monthly, dexamethasone  was discontinued due to profound weight gain  Overall, he has excellent response to therapy. Outpatient PET/CT imaging show no evidence of lytic lesions He will complete last dose of lenalidomide  on September 26, 2023 per instruction from his oncologist from [facility]  Since the last time he is seen, he received conditioning regimen followed by CAR-T cell infusion His postinfusion course was complicated by several hospitalizations due to hypotension He is now fully recovered from side effects of treatment He is asymptomatic He will continue antimicrobial prophylaxis as directed by his physician at [facility], medication list is updated today He has multiple appointments next month and in January The patient desired to return home to [place] in January It is not clear when he will see me back I recommend the patient to call me once his schedule is clear next  year Pancytopenia, acquired [facility]) He has mild pancytopenia from recent treatment In comparison with his labs from 2 weeks ago, pancytopenia is gradually improving He is not symptomatic Continue observation He will continue his antimicrobial prophylaxis Essential hypertension He had abnormal echocardiogram and is treated aggressively by cardiologist at [facility] He will continue his current antihypertensive medications  No orders of the defined types were placed in this encounter.    Almarie Bedford, MD  INTERVAL HISTORY: he returns for treatment follow-up for multiple myeloma, status post CAR-T cell infusion at [facility] I have reviewed his electronic records extensively He is feeling well Denies recent infection No signs or symptoms of congestive heart failure Denies bone pain  PHYSICAL EXAMINATION: ECOG PERFORMANCE STATUS: 0 - Asymptomatic  Lab Results  Component Value Date   IGGSERUM 1,339 06/10/2023   TOTALPROTELP 6.6 06/10/2023   MPROTEIN Not Observed 06/10/2023   IFE Comment 06/10/2023   KPAFRELGTCHN 30.6 (H) 06/10/2023   LAMBDASER 101.4 (H) 06/10/2023   KAPLAMBRATIO 0.30 06/10/2023   KAPLAMBRATIO 0.23 (L) 05/13/2023   KAPLAMBRATIO 0.25 (L) 04/15/2023      Latest Ref Rng & Units 12/09/2023   11:50 AM 09/22/2023   11:35 AM 08/19/2023   10:46 AM  CBC  WBC 4.0 - 10.5 K/uL 3.4  2.9  2.8   Hemoglobin 13.0 - 17.0 g/dL 87.0  87.1  88.2   Hematocrit 39.0 - 52.0 % 35.7  37.3  33.2   Platelets 150 - 400 K/uL 137  141  108       Chemistry      Component Value Date/Time   NA 136 09/22/2023 1135   NA 139 01/27/2017 0809   K 3.7 09/22/2023 1135   K 4.1 01/27/2017 0809   CL 105  09/22/2023 1135   CO2 26 09/22/2023 1135   CO2 23 01/27/2017 0809   BUN 18 09/22/2023 1135   BUN 14.3 01/27/2017 0809   CREATININE 1.03 09/22/2023 1135   CREATININE 1.03 08/19/2023 1046   CREATININE 0.9 01/27/2017 0809      Component Value Date/Time   CALCIUM  9.2 09/22/2023 1135    CALCIUM  9.1 01/27/2017 0809   ALKPHOS 45 09/22/2023 1135   ALKPHOS 42 01/27/2017 0809   AST 15 09/22/2023 1135   AST 14 (L) 08/19/2023 1046   AST 10 01/27/2017 0809   ALT 18 09/22/2023 1135   ALT 17 08/19/2023 1046   ALT 18 01/27/2017 0809   BILITOT 0.6 09/22/2023 1135   BILITOT 0.6 08/19/2023 1046   BILITOT 0.72 01/27/2017 0809       Vitals:   12/09/23 1210  BP: (!) 142/86  Pulse: 87  Resp: 18  Temp: 98.5 F (36.9 C)  SpO2: 97%   Filed Weights   12/09/23 1210  Weight: 251 lb 12.8 oz (114.2 kg)   Other relevant data reviewed during this visit included outside records, CBC and CMP today

## 2023-12-09 NOTE — Assessment & Plan Note (Addendum)
 He has mild pancytopenia from recent treatment In comparison with his labs from 2 weeks ago, pancytopenia is gradually improving He is not symptomatic Continue observation He will continue his antimicrobial prophylaxis

## 2023-12-09 NOTE — Assessment & Plan Note (Addendum)
 He was diagnosed with IgG kappa multiple myeloma in 2017 after presentation with lumbar fracture.   Pathology: BM biopsy showed 50% involvement; Cytogenetics 46XY, positive for 13q-  He received combination chemotherapy with Velcade , lenalidomide  and dexamethasone , followed by change of treatment to carfilzomib , Cytoxan  and dexamethasone .  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  He received dexamethasone , pomalidomide  and daratumumab  followed by maintenance pomalidomide , discontinued in 2021  He has disease relapse in October 2023 and was restarted back on treatment with daratumumab , pomalidomide  and dexamethasone  with partial response.  Treatment was then switched to lenalidomide , bortezomib  and dexamethasone  in April 2024 with VGPR  He will continue lenalidomide  and Velcade  monthly, dexamethasone  was discontinued due to profound weight gain  Overall, he has excellent response to therapy. Outpatient PET/CT imaging show no evidence of lytic lesions He will complete last dose of lenalidomide  on September 26, 2023 per instruction from his oncologist from [facility]  Since the last time he is seen, he received conditioning regimen followed by CAR-T cell infusion His postinfusion course was complicated by several hospitalizations due to hypotension He is now fully recovered from side effects of treatment He is asymptomatic He will continue antimicrobial prophylaxis as directed by his physician at [facility], medication list is updated today He has multiple appointments next month and in January The patient desired to return home to [place] in January It is not clear when he will see me back I recommend the patient to call me once his schedule is clear next year

## 2023-12-09 NOTE — Assessment & Plan Note (Addendum)
 He had abnormal echocardiogram and is treated aggressively by cardiologist at [facility] He will continue his current antihypertensive medications

## 2024-01-27 NOTE — H&P (Signed)
 Pre-Procedure H&P  HPI: Bradley Hunt is a 53 y.o. male with a history of Multiple myeloma presenting today for CT Biopsy Bone Marrow W/ Aspiration.   Medical History[1]   Allergies[2]   Contrast Allergy status: Is the patient allergic to iodinated contrast: No   Anticoagulation:  CHA2DS2-VASc Score: N/A       Current Outpt Blood Thinners (From admission, onward)    None        Relevant Labs: Platelet Count (PLT)  Date Value Ref Range Status  01/27/2024 172 150 - 450 10*3/uL Final   Activated Partial Thromboplastin Time (aPTT)  Date Value Ref Range Status  11/14/2023 23.4 <=30.0 seconds Final   International Normalized Ratio (INR)  Date Value Ref Range Status  11/14/2023 0.9 <5.0 Final     Imaging: No prior imaging   Review of Systems: Constitutional: Negative Respiratory: Negative Cardiac: Negative Gastrointestinal: Negative Neurological: Negative  PHYSICAL EXAM: Vitals: BP 141/83   Pulse 73   Temp 98.6 F (37 C) (Oral)   Resp 14   SpO2 98%   PRE PROC EXAM:  General: No acute distress. HEENT: Normocephalic, atraumatic. PERRL. EOMI. Cardiovascular: Pulses palpable Respiratory: Respirations unlabored on room air. MSK: No gross abnormality. Neuro: AAOx4. Psych: Appropriate mood and affect.  Assessment/Plan:  1. Procedure: CT Biopsy Bone Marrow W/ Aspiration is indicated. The site has not been marked because it's not applicable to this case. This will be completed today, January 27, 2024. This procedure has been fully reviewed with the patient, and written informed consent has been obtained.  2. Anesthesia (type):  3. Prophylactic Antibiotics: The patient does not require any antibiotics to be administered immediately prior to the procedure.  4. Blood product transfusion:  5. Post-procedure care:      [1] Past Medical History: Diagnosis Date   Cancer    (CMD)   [2] Allergies Allergen Reactions   Daratumumab  Diarrhea,  Hypertension, Itching and Shortness Of Breath    Only with IV daratumumab    Heparin  Other (See Comments)    For religious purposes, does not agree to Heparin  or other pork products

## 2024-02-02 NOTE — Progress Notes (Signed)
 MYELOMA AND PLASMA CELL DISORDER CLINIC [facility]  NEW PATIENT OFFICE VISIT  Name: Bradley Hunt MRN:  [id number] Date:  02/03/2024  PCP: [name] Hedy Fuss, MD  Referring physician: No referring provider defined for this encounter.  Thank you very much for referring Bradley Hunt for consultation for Lambda Light Chain myeloma.  Here is a summary of his clinic encounter.   DIAGNOSIS: 1. Lambda light chain myeloma Date of diagnosis: 05/2015 Disease status: PR/SD Current therapy: RVD  Oncology History Overview Note  DIAGNOSIS: 1. 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 Date of diagnosis: 05/2015 Treatment prior to transplant: bortezomib , lenalidomide , and dexamethasone   Clinical trials: Brilliant study Autologous stem cell transplant: HDCT with Melphalan 200 mg IV on 7/18 followed by stem cell infusion on 08/15/2016.   HISTORY OF PRESENT ILLNESS: Bradley Hunt was diagnosed with free light chain multiple myeloma while being evaluated for back pain and gait disturbances in May 2017. He was admitted to the hospital 06/23/15 for studies with an MRI from 06/24/15 revealing osseous lesions of the T6 vertebral body with compression and a second lesion at L3. A biopsy from 06/25/15 from the affected area confirmed plasma cells consistent with plasmacytoma. He underwent a bone marrow biopsy on 07/20/15 which showed 50% clonal plasma cell involvement and 13q- by FISH. Myeloma serology serology was remarkable for M-spike 0.2 (free lambda light chains by IFIX), IgG 511, IgA 17, IgM 11, SFLC ratio 0.01 (kappa 8.1, lambda 1152.2), Hb of 10.1 g/dL, creatinine of 8.35, albumin of 4.4, calcium  of 9.2, . He was diagnosed with active myeloma and was started on treatment.    TREATMENT: He was started on induction with RVd bortezomib , lenalidomide , and dexamethasone  bortezomib , lenalidomide , and dexamethasone  07/31/2015. During initial  cycles, he received 10 rounds of XRT to the spine.  He completed 4 cycles of chemotherapy on 10/31/15 with repeat serology negative for M spike and a serum free light chain ratio of 0.21 (kappa of 10.1 and lambda of 47.7).    Lenalidomide  maintenance was started on 12/14/2015 after not being able to move forward with transplant due to insurance. His light chain ration had dropped to 0.06 (kappa of 7.2 and lambda of 125). In November, 2017, he indicated some back pain and had an MRI on 12/18/2015 which showed numerous enhancing foci throughout the thoracic and lumbar spine with several new small foci in the lumbar spine. His light chain ratio remained stable and M-spike not detectable.  He was referred to the myeloma clinic at [facility] on 04/17/2016 for transplant evaluation and treatment recommendations.   PRE-TRANSPLANT EVALUATION: 07/22/2016 ASBMT classification: low risk, HCT-CI score: 4 2D Echo: 60-65%, PFT's: DLCO 60.8, FEV1: 87.5 SPEP: negative, IFIX: polyclonal, kappa 6.94, lambda 27.82, ratio 0.25. Bone marrow biopsy: Hypercellular marrow (60%) with 10-15% plasma cells. Normal cytogenetics and FISH. PET/CT scan: No FDG avid activity, however multiple lucent lesions noted throughout the skeleton Disease status at time of transplant: PR1  OUTPATIENT AUTOLOGOUS STEM CELL TRANSPLANT Mobilization with Zarxio and Mozobil on 08/05/16 for a total of 1.803x10(7) CD34+ cells/kg. Preparative regimen of Melphalan 200 mg/m2 IV given on 08/14/2016 Infusion of autologous stem cells on 08/15/2016 Admitted 08/24/2016 with neutropenic fever. Started on broad spectrum Cefepime/Vanc. Blood cultures + e.coli. Abx changed to ceftriaxone  with WBC recovery and availability of culture sensitivities. He was discharged to complete a 14 day course after negative blood culture on 08/25/2016 (completion of therapy on 09/08/2016).  Folliculitis treated  with topical Cleocin  T-gel.  Engraftment: WBC 08/27/2016 - Day +12 with ANC  1100 Platelets:  Anticipated for 09/02/2016. Last platelet transfusion on 08/26/2016.  POST-TRANSPLANT COURSE: Day 100 evaluation showed progressive disease with bone marrow biopsy showing 10-15% plasma cells, detectable M-spike, and PET scan showing new FDG updake.  Salvage therapy with daratumumab , pomalidomide , dexamethasone  was started 01/06/2017. 74-month post-transplant visit on 02/19/17 showed possible remission with resolution of the M-spike, normalization of the light chains, and an IgG kappa IFIX that is likely interference from daratumumab .  Continued on single agent pomalidomide  2 mg 21/28 day cycles since 06/2017. Recommendation 09/18/18 to decrease to pomalidomide  due to persistent fatigue.    Multiple myeloma in relapse    (CMD)  08/18/2023 -  Supportive Treatment   BMT Apheresis Un-Mobilized Collection Orders Plan Provider: Dorn Dallas Ehrlich, MD   10/09/2023 -  Supportive Treatment   Adult OP Blood Administration PRN x 30 DAYS Plan Provider: Dorn Dallas Ehrlich, MD   10/09/2023 -  Supportive Treatment   Adult Peripheral IV and Central Venous Access Orders Plan Provider: Dorn Dallas Ehrlich, MD   10/10/2023 - 11/12/2023 Oncology Treatment   Protocol WF - Lymphodepleting Fludarabine  / Cyclophosphamide  for CAR-T Ciltacabtagene Autoleucel (Carvykti) - Multiple Myeloma  Chemotherapy/Immunotherapy Medications ciltacabtagene autoleucel (CARVYKTI) infusion (Dose 0.8x10^8 CAR-positive viable T-cells in 70 mL), 1 Dose, intravenous Administration: 1 Dose (10/15/2023) fludarabine  (FLUDARA ) 72.5 mg in sodium chloride  0.9 % 102.9 mL chemo IVPB, 30 mg/m2 = 72.5 mg, intravenous Administration: 72.5 mg (10/10/2023), 72.5 mg (10/11/2023), 72.5 mg (10/12/2023) cycloPHOSphamide  (CYTOXAN ) 720 mg in sodium chloride  0.9 % 286 mL chemo IVPB, 300 mg/m2 = 720 mg, intravenous Dose modification: 302 mg (original dose 300 mg/m2, Cycle 0, Reason: Other (See Comments), Comment: Pump alerted to air in  the line, and stopped infusing. 120 mL were left to be given.) Administration: 720 mg (10/10/2023), 720 mg (10/11/2023), 720 mg (10/12/2023), 302 mg (10/12/2023)  02 - Therapy Complete   10/15/2023 -  Immune Effector Cell Therapy   CAR-T Therapy   Diagnosis:  Lambda Light Chain Multiple Myeloma  Disease Status:  Progressive Disease.   ECOG/KPS: 90%, HSCT-CI score: 5 Lymphodepleting Regimen:  Fludarabine  30 mg/m2 IV daily for 3 doses and cyclophosphamide  300 mg/m2 IV daily for 3 doses.  CAR-T Product:  Ciltacabtagene autoleucel (Carvykti)  CRS Highest Grade:  None.  ICANS Highest Grade:  None.   WBC Recover (First Day >1,500):  ### with ANC ###. Platelet Recover (First Day >75,000):  ### with platelet count ### with last platelet transfusion given ### Course complicated by:  hypotension/bradycardia (admitted, entresto discontinued, started on metoprolol 25mg  QD)           Mr. Markie is a 54 year old male with a past medical history significant for free lambda light chain myeloma who was referred to clinic to discuss next steps in therapy due to concern for early progressive disease.  Briefly, Bradley Hunt was diagnosed with free light chain multiple myeloma while being evaluated for back pain and gait disturbances in May 2017. He was admitted to the hospital 06/23/15 and underwent an MRI on 06/24/15 revealing osseous lesions of the T6 vertebral body with compression and a second lesion at L3. A biopsy from 06/25/15 of the affected area confirmed plasma cells consistent with plasmacytoma. He underwent a bone marrow biopsy on 07/20/15 which showed 50% clonal plasma cell involvement and 13q- by FISH. Myeloma serology serology was remarkable for M-spike 0.2 (free lambda light chains by IFIX), IgG 511, IgA 17,  IgM 11, SFLC ratio 0.01 (kappa 8.1, lambda 1,152.2), Hb of 10.1 g/dL, creatinine of 8.35, albumin of 4.4, calcium  of 9.2. He was diagnosed with active myeloma and was started on therapy with RVD on  07/31/2015.  He was treated with 10 fractions of radiation to the spine and completed 4 cycles of RVD with repeat lambda light chain values of 47.7.  He was transitioned to Revlimid  maintenance on 12/13/2025.    Unfortunately, in November 2017, he developed new onset back pain and MRI showed numerous enhancing foci throughout the thoracic and lumbar spine with several new small foci in the lumbar spine.  His light chain ratio remained stable at that time.  He was referred to transplant clinic at [facility] on 04/17/2016.  During pretransplant evaluation, bone marrow biopsy revealed hypercellular marrow with up to 20% plasma cells.  He was recommended for reinduction with carfilzomib , Cytoxan  and dexamethasone .  He underwent repeat transplant evaluation and had improvement in plasma cell infiltrate down to 10 to 15% and proceeded with autologous stem cell transplant with melphalan 200 mg/m on 08/14/2016.    Disease evaluation following autologous stem cell transplant continue to show 10 to 15% plasma cells and he was recommended for salvage therapy with daratumumab , Pomalyst  and dexamethasone .  He was also noted to have new lesions on post transplants PET scan at [facility] on 11/26/2016.   He continued this regimen for 6 years.  On routine surveillance labs in February 2023, he was noted to have an increase in lambda light chains above normal limits.  He was continued on therapy without modification and experienced continued rise in lambda light chains to between 200 and to 300 mg/L.  He transitioned to RVD in April 2024.  Over the fall, he experienced a transient increase in light chains and is referred to clinic for second opinion on further management.  Bradley Hunt presents to clinic alone.  He verifies the above history and reports he continued with both daratumumab  and Pomalyst  until 04/2022. He was on Pomalyst  2 mg, which was later increased to 3 mg due to concern for progressive disease.   His  last PET scan was conducted a year ago. He has been informed about CAR T therapy and has researched it online. He reports no fractures or broken bones. He is not currently taking any bone-strengthening medication like Zometa  or Xgeva. He has an upcoming appointment with a dentist on 12/03/2022, after which he will be prescribed Zometa .  He is originally from [place] and has been in the [place]  for 25 years. He is not working right now but was doing some part-time jobs. He has 4 boys aged 53, 56, 61, and 61. He does not smoke cigarettes or drink alcohol. He follows Halal but is not aware of transfused blood product limitations as a result of his belief system.   Treatment History 07/31/2015- RVD 12/14/2015- Revlimid  Maintenance ~03/2016- CyKd 08/14/2016- ASCT ~11/2016- DaraPomDex 2021- stopped therapy 10/2021- restarted DaraPomDex 04/2022- RVD --->PR 10/15/2023- CARVYKTI infusion    Interval History History of Present Illness The patient is a 54 year old male who presents for follow-up of recent disease assessment, persistent fatigue, and diarrhea. He reports persistent fatigue and weakness that began approximately 2 weeks ago. He attributes this to his chemotherapy treatment, which he believes has led to a decrease in his energy levels. He does not sleep a lot and finds it challenging to engage in activities without the aid of Tylenol , which he uses as  a muscle relaxant. He also consumes green tea, which he finds beneficial. He recalls experiencing similar symptoms during his previous chemotherapy sessions.   He experienced diarrhea 2 weeks ago, characterized by watery stools and increased bowel movements, up to 5 or 6 times daily. The diarrhea occasionally disrupts his sleep. He also reports a sensation of gas accumulation in his stomach, particularly at night. He does not experience any associated pain or blood in his stool. His appetite fluctuates, with periods of normal intake  interspersed with episodes of decreased appetite. He recently traveled to [place] for 2 weeks from 11/29/23-12/13/23 due to a family emergency, during which he suspected he contracted malaria. His symptoms started 1 month after his return. He is uncertain if these symptoms are related to his suspected malaria infection or his chemotherapy treatment. He has not taken any stool softeners or other medications to alter his bowel movements. He has been taking acyclovir  but has not taken any specific medication for malaria. He has a history of malaria, having contracted the disease in [place] in 2007 or 2008. He has discontinued Keppra  as per medical advice. He continues to take Bactrim  three times a week and acyclovir  daily.   REVIEW OF SYSTEMS: Constitutional: Denies fevers, chills, night sweats or weight loss Cardiac: Denies CP or palpitations Pulmonary: Denies SOB or cough Abdominal: Denies NVD GU: Denies dysuria or urinary frequency Skin: Denies new rash or lesions   ECOG 0 - Asymptomatic, Karnofsky Performance Scale: 90  PAST MEDICAL HISTORY: he has a past medical history of Cancer    (CMD).  PAST SURGICAL HISTORY:  has a past surgical history that includes Anterior cruciate ligament repair; Back surgery; and Knee surgery.  MEDICATIONS:  Meds Ordered in [facility]  Medication Sig Dispense Refill   acyclovir  (ZOVIRAX ) 400 mg tablet Take 2 tablets (800 mg total) by mouth 2 (two) times a day. 120 tablet 11   calcium  carbonate (TUMS) 500 mg (200 mg calcium ) chewable tablet Take 2 tablets by mouth Once Daily.     cholecalciferol (VITAMIN D3) 1,000 unit (25 mcg) tablet Take 1,000 Units by mouth daily.     empagliflozin (JARDIANCE) 10 mg tab Take 1 tablet (10 mg total) by mouth daily. 90 tablet 3   entecavir  (BARACLUDE ) 0.5 mg tab tablet Take 1 tablet (0.5 mg total) by mouth daily. (Patient taking differently: Take 0.5 mg by mouth daily.) 30 tablet 5   folic acid (FOLVITE) 1 mg tablet Take 1  tablet (1 mg total) by mouth daily. (Patient taking differently: Take 1 mg by mouth daily.) 90 tablet 1   losartan  (COZAAR ) 25 mg tablet Take 1 tablet (25 mg total) by mouth daily. (Patient taking differently: Take 25 mg by mouth daily.) 90 tablet 3   metoprolol succinate (TOPROL XL) 25 mg 24 hr tablet Take 1 tablet (25 mg total) by mouth daily. (Patient taking differently: Take 25 mg by mouth daily.) 60 tablet 0   multivitamin (THERAGRAN) tab tablet Take 1 tablet by mouth daily. (Patient taking differently: Take 1 tablet by mouth daily.) 100 tablet 1   ondansetron  (ZOFRAN ) 8 mg tablet Take 1 tablet (8 mg total) by mouth every 8 (eight) hours as needed for nausea or vomiting. 30 tablet 1   prochlorperazine  (COMPAZINE ) 10 mg tablet Take 1 tablet (10 mg total) by mouth every 6 (six) hours as needed for nausea or vomiting. 30 tablet 0   spironolactone (ALDACTONE) 25 mg tablet Take 1 tablet (25 mg total) by mouth daily. (Patient taking  differently: Take 25 mg by mouth daily.) 90 tablet 3   sulfamethoxazole -trimethoprim  (BACTRIM  DS) 800-160 mg per tablet Take 1 tablet by mouth 3 (three) times a week. 75 tablet 0   traMADoL (ULTRAM) 50 mg tablet Take 0.5 tablets (25 mg total) by mouth every 8 (eight) hours as needed for moderate pain (4-6) or severe pain (7-10). 15 tablet 0   No current Epic-ordered facility-administered medications on file.   I have documented the patient's current medications to the best of my ability.   ALLERGIES:  Allergies  Allergen Reactions   Daratumumab  Diarrhea, Hypertension, Itching and Shortness Of Breath    Only with IV daratumumab    Heparin  Other (See Comments)    For religious purposes, does not agree to Heparin  or other pork products    SOCIAL HISTORY:  reports that he quit smoking about 26 years ago. His smoking use included cigarettes. He smoked an average of 1 pack per day. He has never used smokeless tobacco. He reports that he does not drink alcohol and  does not use drugs.   FAMILY HISTORY: family history is not on file.  No other family history of leukemia, lymphoma, or myeloma.   PHYSICAL EXAMINATION: BP 129/76 (BP Location: Left arm, Patient Position: Sitting)   Pulse 90   Temp 98 F (36.7 C) (Temporal)   Resp 16   Ht 1.88 m (6' 2)   Wt 109 kg (240 lb 12.8 oz)   SpO2 98%   BMI 30.92 kg/m       GENERAL: Pleasant 54 yo M seated comfortably in clinic alone in mild distress, rises to exam table without difficulty HEENT: Anicteric sclerae, EOMI, PERRL. Mucous membranes moist without oropharyngeal lesions. NECK: Supple without JVD or thyromegaly. LUNGS: Clear to auscultation without wheezes, crackles or rhonchi. Normal chest expansion.  CARDIAC: RRR ABDOMEN: Soft, non distended, non tender, normoactive bowel sounds. No hepatosplenomegaly or other masses palpable. EXTREMITIES: No LE edema, no clubbing or cyanosis. 2+ pulses distally. Musculoskeletal: No weakness. Range of motion intact.  NEUROLOGIC: Alert and oriented. Cranial nerves III-XII grossly symmetric and intact. SKIN: No petechiae, large ecchymoses, or rash noted. LYMPH: No cervical, supraclavicular, axillary or inguinal adenopathy palpable.   LABORATORY DATA:(Personally reviewed by me) Recent Results (from the past week)  Gastrointestinal Pathogen Profile, Stool, NAAT   Collection Time: 02/03/24 11:55 AM   Specimen: Stool, Per Rectum  Result Value Ref Range   Campylobacter PCR Not Detected Not Detected   Plesiomonas shigelloides PCR Not Detected Not Detected   Salmonella PCR Not Detected Not Detected   Vibrio PCR Not Detected Not Detected   Yersinia enterocolitica PCR Not Detected Not Detected   Enteroaggregative E coli PCR Detected (A) Not Detected   Enteropathogenic E coli PCR Not Detected Not Detected   Enterotoxigenic E coli PCR Not Detected Not Detected   Shiga-Toxin-Producing E coli PCR Not Detected Not Detected   Shigella/Enteroinvasive E coli PCR Not  Detected Not Detected   Cryptosporidium PCR Not Detected Not Detected   Cyclospora cayetanensis PCR Not Detected Not Detected   Entamoeba histolytica PCR Not Detected Not Detected   Giardia lamblia PCR Not Detected Not Detected   Adenovirus F 40/41 PCR Not Detected Not Detected   Astrovirus PCR Not Detected Not Detected   Norovirus GI/GII PCR Detected (A) Not Detected   Rotavirus A PCR Detected (A) Not Detected   Sapovirus PCR Not Detected Not Detected  Clostridioides difficile Antigen and Toxin EIA   Collection Time: 02/03/24 11:55 AM  Specimen: Stool, Per Rectum  Result Value Ref Range   C. Diff. Toxin A/B EIA Negative Negative   Clostridium difficile antigens Negative Negative  Fecal Lactoferrin, Quantitative   Collection Time: 02/03/24 11:55 AM  Result Value Ref Range   Lactoferrin, Fecal, Quant. 5.67 0.00 - 7.24 ug/mL(g)       Myeloma Labs: Lab Results  Component Value Date   WBC 5.50 01/27/2024   HGB 12.3 (L) 01/27/2024   PLT 172 01/27/2024   BUN 18 01/27/2024   CREATININE 0.94 01/27/2024   CALCIUM  9.2 01/27/2024   Lab Results  Component Value Date   BILITOT 0.4 01/27/2024   AST 23 01/27/2024   ALT 30 01/27/2024   PROT 6.3 (L) 01/27/2024   PROT 5.7 (L) 01/27/2024   ALBUMIN 4.2 01/27/2024   LDH 171 11/14/2023    SPEP: Lab Results  Component Value Date   LABALPH 0.3 01/27/2024   LABALPH 0.8 01/27/2024   BETA2 0.3 11/02/2021    IFIX: No components found for: IFHOW, IGD, IFCM  Free light chains: No components found for: FREEKAPPA, FREELAMBDA, KAPLAMDRATIO  Immunoglobulins: Lab Results  Component Value Date   IGA 98 11/02/2021    Beta-2  microglobulin: No components found for: B2MS  Anemia Labs: Lab Results  Component Value Date   FERRITIN 83 11/14/2023   No results found for: B12B, FOLATE    RADIOGRAPHIC DATA:(Personally reviewed by me)   PATHOLOGY DATA:    DISCUSSION:  Assessment & Plan Bradley Hunt is a pleasant  54 year old male with a past medical history of lambda light chain myeloma who presents to clinic on day 111 status post carvedilol infusion for follow-up of recent disease assessment. He underwent bone marrow biopsy, PET scan, and peripheral blood work, which showed no evidence of detectable myeloma. This is very good news and he is glad to hear it.   Unfortunately, he has noted persistent fatigue since his treatment, which does not limit his activities but makes them more difficult to engage in. He also notes recent onset diarrhea in the last 2 weeks, the etiology of which is unclear. He reports that he did travel to [place], specifically [place], in the last 8 weeks. He traveled there in early November and was there for 2 weeks helping assist a sick family member. He did not develop diarrhea for 4 weeks after his return but did develop diarrhea about 2 weeks ago. The differential for his diarrhea includes infectious diarrhea as well as possible immune-mediated diarrhea, known as immunofactor cell enterocolitis, which is a rare condition and may require colonoscopy for diagnosis. Based on my brief review of literature, he is in the right timeframe to experience the symptom. However, I will not start steroids until we rule out alternative explanations. He would likely benefit from follow-up in 2 weeks to document clearance of diarrhea or to initiate further workup if diarrhea persists.  Plan: Lambda light chain myeloma - Continue taking Bactrim  three times a week and acyclovir  daily for at least the first 6 months, probably the first year - start Monthly IVIG treatment locally with Dr. Clayborne - would monitor myeloma markers at least every other month locally  Diarrhea - Stool testing to be conducted today to check for infections - If stool testing shows no evidence of infectious diarrhea, management with Imodium will be attempted - Follow-up in 2 weeks to assess response or persistence of symptoms -  results available after clinic show norovirus, rotovirus and enteroaggregative e coli - will call out azithromycin   500mg  for three days - plan for follow up soon to verify improvement or resolution - he continues to maintain PO is non-septic appearing  Fatigue - TSH attempted to be added on to labs at disease assessment - may benefit from TSH check locally or at next follow up  - Currently managing symtpoms with Tylenol  and green tea  Health maintenance - Influenza and COVID-19 vaccines recommended if available today  HFrEF - Now on goal directed therapy - No symptoms noted today  Hepatitis Exposure - Exposed to hepatitis in the past - Will plan for entacavir for one year post infusion  Follow-up Appointments which have been scheduled for you    Feb 17, 2024 1:00 PM LAB with HEM ONC 03 LAB [facility] [place] [facility] Hematology Oncology [facility] [place] [address] [phone]  Please arrive 15 minutes prior to your scheduled appointment.     Feb 17, 2024 1:30 PM Office Visit with Dorn Dallas Ehrlich, MD [facility] [place] [facility] [place] [address] Hematology Oncology [facility]) [facility] [place] [address] [phone]  Please arrive 15 minutes prior to your scheduled visit.    Apr 13, 2024 12:30 PM LAB with HEM ONC 03 LAB [facility] [place] 96 Hematology Oncology [facility]) [facility] [place] [address] [phone]  Please arrive 15 minutes prior to your scheduled appointment.     Apr 13, 2024 1:00 PM Office Visit with CHERIE PITCH 03 BMT 2 [facility] [place] 96 Hematology Oncology [facility]) [facility] [place] [address] [phone]  Please arrive 15 minutes prior to your scheduled visit.    Oct 19, 2024 12:30  PM LAB with HEM ONC 03 LAB [facility] [place] [address] Hematology Oncology [facility]) [facility] [place] [address] [phone]  Please arrive 15 minutes prior to your scheduled appointment.     Oct 19, 2024 1:00 PM Office Visit with Dorn Dallas Ehrlich, MD [facility] [place] [facility] [place] [address] Hematology Oncology [facility]) [facility] [place] [id number] [phone]  Please arrive 15 minutes prior to your scheduled visit.              Dorn FORBES Ehrlich, MD 02/06/2024

## 2024-02-10 ENCOUNTER — Emergency Department (HOSPITAL_COMMUNITY)

## 2024-02-10 ENCOUNTER — Inpatient Hospital Stay (HOSPITAL_COMMUNITY)

## 2024-02-10 ENCOUNTER — Inpatient Hospital Stay (HOSPITAL_COMMUNITY)
Admission: EM | Admit: 2024-02-10 | Discharge: 2024-02-19 | DRG: 286 | Disposition: A | Discharge status: Home / Self Care | Attending: Internal Medicine | Admitting: Internal Medicine

## 2024-02-10 DIAGNOSIS — I5023 Acute on chronic systolic (congestive) heart failure: Secondary | ICD-10-CM | POA: Diagnosis present

## 2024-02-10 DIAGNOSIS — E872 Acidosis, unspecified: Secondary | ICD-10-CM | POA: Diagnosis not present

## 2024-02-10 DIAGNOSIS — I959 Hypotension, unspecified: Secondary | ICD-10-CM | POA: Diagnosis present

## 2024-02-10 DIAGNOSIS — Z6831 Body mass index (BMI) 31.0-31.9, adult: Secondary | ICD-10-CM | POA: Diagnosis not present

## 2024-02-10 DIAGNOSIS — W19XXXA Unspecified fall, initial encounter: Secondary | ICD-10-CM | POA: Diagnosis present

## 2024-02-10 DIAGNOSIS — Y92239 Unspecified place in hospital as the place of occurrence of the external cause: Secondary | ICD-10-CM | POA: Diagnosis not present

## 2024-02-10 DIAGNOSIS — I472 Ventricular tachycardia, unspecified: Secondary | ICD-10-CM | POA: Diagnosis present

## 2024-02-10 DIAGNOSIS — E876 Hypokalemia: Secondary | ICD-10-CM | POA: Diagnosis present

## 2024-02-10 DIAGNOSIS — D849 Immunodeficiency, unspecified: Secondary | ICD-10-CM | POA: Diagnosis present

## 2024-02-10 DIAGNOSIS — G931 Anoxic brain damage, not elsewhere classified: Secondary | ICD-10-CM | POA: Diagnosis present

## 2024-02-10 DIAGNOSIS — R569 Unspecified convulsions: Secondary | ICD-10-CM | POA: Diagnosis present

## 2024-02-10 DIAGNOSIS — J151 Pneumonia due to Pseudomonas: Secondary | ICD-10-CM | POA: Diagnosis present

## 2024-02-10 DIAGNOSIS — A044 Other intestinal Escherichia coli infections: Secondary | ICD-10-CM | POA: Diagnosis present

## 2024-02-10 DIAGNOSIS — A0811 Acute gastroenteropathy due to Norwalk agent: Secondary | ICD-10-CM | POA: Diagnosis present

## 2024-02-10 DIAGNOSIS — Z9911 Dependence on respirator [ventilator] status: Secondary | ICD-10-CM

## 2024-02-10 DIAGNOSIS — E8729 Other acidosis: Secondary | ICD-10-CM | POA: Diagnosis not present

## 2024-02-10 DIAGNOSIS — Z87891 Personal history of nicotine dependence: Secondary | ICD-10-CM

## 2024-02-10 DIAGNOSIS — Z91014 Allergy to mammalian meats: Secondary | ICD-10-CM

## 2024-02-10 DIAGNOSIS — D649 Anemia, unspecified: Secondary | ICD-10-CM | POA: Diagnosis present

## 2024-02-10 DIAGNOSIS — I110 Hypertensive heart disease with heart failure: Secondary | ICD-10-CM | POA: Diagnosis present

## 2024-02-10 DIAGNOSIS — E870 Hyperosmolality and hypernatremia: Secondary | ICD-10-CM | POA: Diagnosis not present

## 2024-02-10 DIAGNOSIS — R253 Fasciculation: Secondary | ICD-10-CM | POA: Diagnosis not present

## 2024-02-10 DIAGNOSIS — J9601 Acute respiratory failure with hypoxia: Secondary | ICD-10-CM | POA: Diagnosis present

## 2024-02-10 DIAGNOSIS — Y92091 Bathroom in other non-institutional residence as the place of occurrence of the external cause: Secondary | ICD-10-CM | POA: Diagnosis not present

## 2024-02-10 DIAGNOSIS — B1910 Unspecified viral hepatitis B without hepatic coma: Secondary | ICD-10-CM | POA: Diagnosis present

## 2024-02-10 DIAGNOSIS — D61818 Other pancytopenia: Secondary | ICD-10-CM | POA: Diagnosis present

## 2024-02-10 DIAGNOSIS — I462 Cardiac arrest due to underlying cardiac condition: Secondary | ICD-10-CM | POA: Diagnosis present

## 2024-02-10 DIAGNOSIS — Z9889 Other specified postprocedural states: Secondary | ICD-10-CM

## 2024-02-10 DIAGNOSIS — I5022 Chronic systolic (congestive) heart failure: Secondary | ICD-10-CM | POA: Diagnosis not present

## 2024-02-10 DIAGNOSIS — I493 Ventricular premature depolarization: Secondary | ICD-10-CM | POA: Diagnosis present

## 2024-02-10 DIAGNOSIS — E669 Obesity, unspecified: Secondary | ICD-10-CM | POA: Diagnosis present

## 2024-02-10 DIAGNOSIS — Z923 Personal history of irradiation: Secondary | ICD-10-CM

## 2024-02-10 DIAGNOSIS — I509 Heart failure, unspecified: Secondary | ICD-10-CM | POA: Diagnosis not present

## 2024-02-10 DIAGNOSIS — Y848 Other medical procedures as the cause of abnormal reaction of the patient, or of later complication, without mention of misadventure at the time of the procedure: Secondary | ICD-10-CM | POA: Diagnosis not present

## 2024-02-10 DIAGNOSIS — I358 Other nonrheumatic aortic valve disorders: Secondary | ICD-10-CM | POA: Diagnosis present

## 2024-02-10 DIAGNOSIS — N179 Acute kidney failure, unspecified: Secondary | ICD-10-CM | POA: Diagnosis present

## 2024-02-10 DIAGNOSIS — G259 Extrapyramidal and movement disorder, unspecified: Secondary | ICD-10-CM | POA: Diagnosis not present

## 2024-02-10 DIAGNOSIS — C9000 Multiple myeloma not having achieved remission: Secondary | ICD-10-CM | POA: Diagnosis present

## 2024-02-10 DIAGNOSIS — B9789 Other viral agents as the cause of diseases classified elsewhere: Secondary | ICD-10-CM | POA: Diagnosis present

## 2024-02-10 DIAGNOSIS — R293 Abnormal posture: Secondary | ICD-10-CM | POA: Diagnosis not present

## 2024-02-10 DIAGNOSIS — I469 Cardiac arrest, cause unspecified: Secondary | ICD-10-CM | POA: Diagnosis present

## 2024-02-10 DIAGNOSIS — Z205 Contact with and (suspected) exposure to viral hepatitis: Secondary | ICD-10-CM | POA: Diagnosis not present

## 2024-02-10 DIAGNOSIS — I5021 Acute systolic (congestive) heart failure: Secondary | ICD-10-CM

## 2024-02-10 DIAGNOSIS — R7989 Other specified abnormal findings of blood chemistry: Secondary | ICD-10-CM | POA: Diagnosis not present

## 2024-02-10 DIAGNOSIS — Z7984 Long term (current) use of oral hypoglycemic drugs: Secondary | ICD-10-CM

## 2024-02-10 DIAGNOSIS — E854 Organ-limited amyloidosis: Secondary | ICD-10-CM | POA: Diagnosis present

## 2024-02-10 DIAGNOSIS — K7200 Acute and subacute hepatic failure without coma: Secondary | ICD-10-CM | POA: Diagnosis present

## 2024-02-10 DIAGNOSIS — Z888 Allergy status to other drugs, medicaments and biological substances status: Secondary | ICD-10-CM

## 2024-02-10 DIAGNOSIS — E162 Hypoglycemia, unspecified: Secondary | ICD-10-CM | POA: Diagnosis not present

## 2024-02-10 DIAGNOSIS — I4901 Ventricular fibrillation: Secondary | ICD-10-CM | POA: Diagnosis present

## 2024-02-10 DIAGNOSIS — Z9484 Stem cells transplant status: Secondary | ICD-10-CM | POA: Diagnosis not present

## 2024-02-10 DIAGNOSIS — U071 COVID-19: Secondary | ICD-10-CM | POA: Diagnosis present

## 2024-02-10 DIAGNOSIS — R4189 Other symptoms and signs involving cognitive functions and awareness: Secondary | ICD-10-CM

## 2024-02-10 DIAGNOSIS — I43 Cardiomyopathy in diseases classified elsewhere: Secondary | ICD-10-CM | POA: Diagnosis present

## 2024-02-10 DIAGNOSIS — Y828 Other medical devices associated with adverse incidents: Secondary | ICD-10-CM | POA: Diagnosis not present

## 2024-02-10 DIAGNOSIS — I5020 Unspecified systolic (congestive) heart failure: Secondary | ICD-10-CM | POA: Diagnosis not present

## 2024-02-10 DIAGNOSIS — J690 Pneumonitis due to inhalation of food and vomit: Secondary | ICD-10-CM | POA: Diagnosis present

## 2024-02-10 DIAGNOSIS — B009 Herpesviral infection, unspecified: Secondary | ICD-10-CM | POA: Diagnosis present

## 2024-02-10 DIAGNOSIS — J189 Pneumonia, unspecified organism: Secondary | ICD-10-CM | POA: Diagnosis not present

## 2024-02-10 DIAGNOSIS — Z9221 Personal history of antineoplastic chemotherapy: Secondary | ICD-10-CM

## 2024-02-10 DIAGNOSIS — Z79624 Long term (current) use of inhibitors of nucleotide synthesis: Secondary | ICD-10-CM

## 2024-02-10 DIAGNOSIS — T85628A Displacement of other specified internal prosthetic devices, implants and grafts, initial encounter: Secondary | ICD-10-CM | POA: Diagnosis not present

## 2024-02-10 DIAGNOSIS — G9340 Encephalopathy, unspecified: Secondary | ICD-10-CM | POA: Diagnosis not present

## 2024-02-10 DIAGNOSIS — Z79899 Other long term (current) drug therapy: Secondary | ICD-10-CM

## 2024-02-10 LAB — CBC
HCT: 33.7 % — ABNORMAL LOW (ref 39.0–52.0)
HCT: 32.3 % — ABNORMAL LOW (ref 39.0–52.0)
Hemoglobin: 11.1 g/dL — ABNORMAL LOW (ref 13.0–17.0)
Hemoglobin: 10.9 g/dL — ABNORMAL LOW (ref 13.0–17.0)
MCH: 32.4 pg (ref 26.0–34.0)
MCH: 31.9 pg (ref 26.0–34.0)
MCHC: 32.9 g/dL (ref 30.0–36.0)
MCHC: 33.7 g/dL (ref 30.0–36.0)
MCV: 98.3 fL (ref 80.0–100.0)
MCV: 94.4 fL (ref 80.0–100.0)
Platelets: 177 K/uL (ref 150–400)
Platelets: 137 K/uL — ABNORMAL LOW (ref 150–400)
RBC: 3.43 MIL/uL — ABNORMAL LOW (ref 4.22–5.81)
RBC: 3.42 MIL/uL — ABNORMAL LOW (ref 4.22–5.81)
RDW: 13.1 % (ref 11.5–15.5)
RDW: 13 % (ref 11.5–15.5)
WBC: 8.8 K/uL (ref 4.0–10.5)
WBC: 4 K/uL (ref 4.0–10.5)
nRBC: 0 % (ref 0.0–0.2)
nRBC: 0 % (ref 0.0–0.2)

## 2024-02-10 LAB — BASIC METABOLIC PANEL WITH GFR
Anion gap: 16 — ABNORMAL HIGH (ref 5–15)
BUN: 11 mg/dL (ref 6–20)
CO2: 17 mmol/L — ABNORMAL LOW (ref 22–32)
Calcium: 8 mg/dL — ABNORMAL LOW (ref 8.9–10.3)
Chloride: 110 mmol/L (ref 98–111)
Creatinine, Ser: 1.22 mg/dL (ref 0.61–1.24)
GFR, Estimated: >60 mL/min
Glucose, Bld: 148 mg/dL — ABNORMAL HIGH (ref 70–99)
Potassium: 2.8 mmol/L — ABNORMAL LOW (ref 3.5–5.1)
Sodium: 143 mmol/L (ref 135–145)

## 2024-02-10 LAB — I-STAT ARTERIAL BLOOD GAS, ED
Acid-base deficit: 5 mmol/L — ABNORMAL HIGH (ref 0.0–2.0)
Bicarbonate: 20.5 mmol/L (ref 20.0–28.0)
Calcium, Ion: 1.38 mmol/L (ref 1.15–1.40)
HCT: 34 % — ABNORMAL LOW (ref 39.0–52.0)
Hemoglobin: 11.6 g/dL — ABNORMAL LOW (ref 13.0–17.0)
O2 Saturation: 100 %
Patient temperature: 97.7
Potassium: 2.6 mmol/L — CL (ref 3.5–5.1)
Sodium: 143 mmol/L (ref 135–145)
TCO2: 22 mmol/L (ref 22–32)
pCO2 arterial: 38.5 mmHg (ref 32–48)
pH, Arterial: 7.333 — ABNORMAL LOW (ref 7.35–7.45)
pO2, Arterial: 256 mmHg — ABNORMAL HIGH (ref 83–108)

## 2024-02-10 LAB — HEPATIC FUNCTION PANEL
ALT: 78 U/L — ABNORMAL HIGH (ref 0–44)
AST: 69 U/L — ABNORMAL HIGH (ref 15–41)
Albumin: 3.8 g/dL (ref 3.5–5.0)
Alkaline Phosphatase: 64 U/L (ref 38–126)
Bilirubin, Direct: 0.1 mg/dL (ref 0.0–0.2)
Indirect Bilirubin: 0.3 mg/dL (ref 0.3–0.9)
Total Bilirubin: 0.4 mg/dL (ref 0.0–1.2)
Total Protein: 5.6 g/dL — ABNORMAL LOW (ref 6.5–8.1)

## 2024-02-10 LAB — LIPASE, BLOOD: Lipase: 86 U/L — ABNORMAL HIGH (ref 11–51)

## 2024-02-10 LAB — I-STAT CHEM 8, ED
BUN: 11 mg/dL (ref 6–20)
Calcium, Ion: 1.11 mmol/L — ABNORMAL LOW (ref 1.15–1.40)
Chloride: 108 mmol/L (ref 98–111)
Creatinine, Ser: 1.3 mg/dL — ABNORMAL HIGH (ref 0.61–1.24)
Glucose, Bld: 153 mg/dL — ABNORMAL HIGH (ref 70–99)
HCT: 34 % — ABNORMAL LOW (ref 39.0–52.0)
Hemoglobin: 11.6 g/dL — ABNORMAL LOW (ref 13.0–17.0)
Potassium: 2.8 mmol/L — ABNORMAL LOW (ref 3.5–5.1)
Sodium: 144 mmol/L (ref 135–145)
TCO2: 17 mmol/L — ABNORMAL LOW (ref 22–32)

## 2024-02-10 LAB — I-STAT CG4 LACTIC ACID, ED
Lactic Acid, Venous: 4.8 mmol/L (ref 0.5–1.9)
Lactic Acid, Venous: 2.4 mmol/L (ref 0.5–1.9)

## 2024-02-10 LAB — TSH: TSH: 1.85 u[IU]/mL (ref 0.350–4.500)

## 2024-02-10 LAB — RESP PANEL BY RT-PCR (RSV, FLU A&B, COVID)  RVPGX2
Influenza A by PCR: NEGATIVE
Influenza B by PCR: NEGATIVE
Resp Syncytial Virus by PCR: NEGATIVE
SARS Coronavirus 2 by RT PCR: POSITIVE — AB

## 2024-02-10 LAB — TROPONIN T, HIGH SENSITIVITY
Troponin T High Sensitivity: 25 ng/L — ABNORMAL HIGH (ref 0–19)
Troponin T High Sensitivity: 401 ng/L (ref 0–19)

## 2024-02-10 LAB — MAGNESIUM: Magnesium: 1.5 mg/dL — ABNORMAL LOW (ref 1.7–2.4)

## 2024-02-10 LAB — GLUCOSE, CAPILLARY: Glucose-Capillary: 98 mg/dL (ref 70–99)

## 2024-02-10 LAB — PRO BRAIN NATRIURETIC PEPTIDE: Pro Brain Natriuretic Peptide: 83.3 pg/mL

## 2024-02-10 MED ORDER — NOREPINEPHRINE 4 MG/250ML-% IV SOLN
0.0000 ug/min | INTRAVENOUS | Status: DC
  Administered 2024-02-10: 5 ug/min via INTRAVENOUS

## 2024-02-10 MED ORDER — FENTANYL 2500MCG IN NS 250ML (10MCG/ML) PREMIX INFUSION
0.0000 ug/h | INTRAVENOUS | Status: DC
  Administered 2024-02-10: 50 ug/h via INTRAVENOUS
  Filled 2024-02-10: qty 250

## 2024-02-10 MED ORDER — IOHEXOL 350 MG/ML SOLN
75.0000 mL | Freq: Once | INTRAVENOUS | Status: AC | PRN
  Administered 2024-02-10: 75 mL via INTRAVENOUS

## 2024-02-10 MED ORDER — ACETAMINOPHEN 325 MG PO TABS
650.0000 mg | ORAL_TABLET | ORAL | Status: DC | PRN
  Filled 2024-02-10: qty 2

## 2024-02-10 MED ORDER — ETOMIDATE 2 MG/ML IV SOLN
INTRAVENOUS | Status: AC | PRN
  Administered 2024-02-10: 20 mg via INTRAVENOUS

## 2024-02-10 MED ORDER — POTASSIUM CHLORIDE 10 MEQ/100ML IV SOLN
10.0000 meq | INTRAVENOUS | Status: AC
  Administered 2024-02-10 – 2024-02-11 (×6): 10 meq via INTRAVENOUS
  Filled 2024-02-10 (×6): qty 100

## 2024-02-10 MED ORDER — FENTANYL 2500MCG IN NS 250ML (10MCG/ML) PREMIX INFUSION
0.0000 ug/h | INTRAVENOUS | Status: DC
  Administered 2024-02-11 (×2): 150 ug/h via INTRAVENOUS
  Administered 2024-02-12: 175 ug/h via INTRAVENOUS
  Administered 2024-02-12 – 2024-02-13 (×2): 200 ug/h via INTRAVENOUS
  Filled 2024-02-10 (×5): qty 250

## 2024-02-10 MED ORDER — PROPOFOL 1000 MG/100ML IV EMUL
0.0000 ug/kg/min | INTRAVENOUS | Status: DC
  Administered 2024-02-10: 20 ug/kg/min via INTRAVENOUS
  Administered 2024-02-11: 50 ug/kg/min via INTRAVENOUS
  Administered 2024-02-11: 15 ug/kg/min via INTRAVENOUS
  Administered 2024-02-11: 50 ug/kg/min via INTRAVENOUS
  Administered 2024-02-11: 60 ug/kg/min via INTRAVENOUS
  Administered 2024-02-11: 50 ug/kg/min via INTRAVENOUS
  Administered 2024-02-11: 60 ug/kg/min via INTRAVENOUS
  Administered 2024-02-11 (×2): 50 ug/kg/min via INTRAVENOUS
  Administered 2024-02-12: 40 ug/kg/min via INTRAVENOUS
  Administered 2024-02-12: 30 ug/kg/min via INTRAVENOUS
  Administered 2024-02-12: 50 ug/kg/min via INTRAVENOUS
  Administered 2024-02-12: 40 ug/kg/min via INTRAVENOUS
  Administered 2024-02-12: 50 ug/kg/min via INTRAVENOUS
  Administered 2024-02-12: 55 ug/kg/min via INTRAVENOUS
  Administered 2024-02-12 (×2): 40 ug/kg/min via INTRAVENOUS
  Administered 2024-02-13: 35 ug/kg/min via INTRAVENOUS
  Administered 2024-02-13 (×3): 40 ug/kg/min via INTRAVENOUS
  Filled 2024-02-10: qty 200
  Filled 2024-02-10 (×4): qty 100
  Filled 2024-02-10: qty 200
  Filled 2024-02-10: qty 100
  Filled 2024-02-10 (×2): qty 200
  Filled 2024-02-10 (×6): qty 100

## 2024-02-10 MED ORDER — SODIUM CHLORIDE 0.9 % IV SOLN
250.0000 mL | INTRAVENOUS | Status: AC
  Administered 2024-02-10: 250 mL via INTRAVENOUS

## 2024-02-10 MED ORDER — POLYETHYLENE GLYCOL 3350 17 G PO PACK
17.0000 g | PACK | Freq: Every day | ORAL | Status: DC
  Administered 2024-02-10: 17 g
  Filled 2024-02-10 (×2): qty 1

## 2024-02-10 MED ORDER — ACETAMINOPHEN 160 MG/5ML PO SOLN
650.0000 mg | ORAL | Status: DC | PRN
  Administered 2024-02-12 – 2024-02-13 (×2): 650 mg
  Filled 2024-02-10 (×2): qty 20.3

## 2024-02-10 MED ORDER — FAMOTIDINE 20 MG PO TABS
20.0000 mg | ORAL_TABLET | Freq: Two times a day (BID) | ORAL | Status: DC
  Administered 2024-02-10 – 2024-02-14 (×8): 20 mg
  Filled 2024-02-10 (×8): qty 1

## 2024-02-10 MED ORDER — ONDANSETRON HCL 4 MG/2ML IJ SOLN
4.0000 mg | Freq: Four times a day (QID) | INTRAMUSCULAR | Status: DC | PRN
  Administered 2024-02-13: 4 mg via INTRAVENOUS
  Filled 2024-02-10: qty 2

## 2024-02-10 MED ORDER — NOREPINEPHRINE 4 MG/250ML-% IV SOLN
0.0000 ug/min | INTRAVENOUS | Status: DC
  Filled 2024-02-10: qty 500

## 2024-02-10 MED ORDER — FENTANYL BOLUS VIA INFUSION
25.0000 ug | INTRAVENOUS | Status: DC | PRN
  Administered 2024-02-10: 100 ug via INTRAVENOUS
  Administered 2024-02-11: 50 ug via INTRAVENOUS
  Administered 2024-02-11: 100 ug via INTRAVENOUS
  Administered 2024-02-11 (×3): 50 ug via INTRAVENOUS
  Administered 2024-02-12 – 2024-02-13 (×7): 100 ug via INTRAVENOUS
  Administered 2024-02-13: 50 ug via INTRAVENOUS

## 2024-02-10 MED ORDER — FONDAPARINUX SODIUM 2.5 MG/0.5ML ~~LOC~~ SOLN
2.5000 mg | SUBCUTANEOUS | Status: DC
  Administered 2024-02-11 – 2024-02-16 (×6): 2.5 mg via SUBCUTANEOUS
  Filled 2024-02-10 (×6): qty 0.5

## 2024-02-10 MED ORDER — LEVETIRACETAM (KEPPRA) 500 MG/5 ML ADULT IV PUSH
3000.0000 mg | Freq: Once | INTRAVENOUS | Status: AC
  Administered 2024-02-10: 3000 mg via INTRAVENOUS

## 2024-02-10 MED ORDER — FENTANYL CITRATE (PF) 50 MCG/ML IJ SOSY: 25.0000 ug | PREFILLED_SYRINGE | Freq: Once | INTRAMUSCULAR | Status: DC

## 2024-02-10 MED ORDER — MAGNESIUM SULFATE 2 GM/50ML IV SOLN
2.0000 g | Freq: Once | INTRAVENOUS | Status: AC
  Administered 2024-02-10: 2 g via INTRAVENOUS
  Filled 2024-02-10: qty 50

## 2024-02-10 MED ORDER — ACETAMINOPHEN 650 MG RE SUPP: 650.0000 mg | RECTAL | Status: DC | PRN

## 2024-02-10 MED ORDER — ROCURONIUM BROMIDE 10 MG/ML (PF) SYRINGE
PREFILLED_SYRINGE | INTRAVENOUS | Status: AC | PRN
  Administered 2024-02-10: 80 mg via INTRAVENOUS

## 2024-02-10 MED ORDER — SENNA 8.6 MG PO TABS
1.0000 | ORAL_TABLET | Freq: Two times a day (BID) | ORAL | Status: DC
  Administered 2024-02-10: 8.6 mg
  Filled 2024-02-10 (×2): qty 1

## 2024-02-10 MED ORDER — SODIUM BICARBONATE 8.4 % IV SOLN
100.0000 meq | Freq: Once | INTRAVENOUS | Status: AC
  Administered 2024-02-11: 100 meq via INTRAVENOUS
  Filled 2024-02-10: qty 100

## 2024-02-10 MED ORDER — SODIUM CHLORIDE 0.9 % IV SOLN
2.0000 g | INTRAVENOUS | Status: DC
  Administered 2024-02-10: 2 g via INTRAVENOUS
  Filled 2024-02-10: qty 20

## 2024-02-10 MED ORDER — CHLORHEXIDINE GLUCONATE CLOTH 2 % EX PADS
6.0000 | MEDICATED_PAD | Freq: Every day | CUTANEOUS | Status: DC
  Administered 2024-02-10 – 2024-02-15 (×6): 6 via TOPICAL

## 2024-02-10 NOTE — ED Provider Notes (Signed)
 " [place] EMERGENCY DEPARTMENT AT [facility] Provider Note   CSN: [id number] Arrival date & time: 02/10/24  1844     Patient presents with: No chief complaint on file.   Bradley Hunt is a 54 y.o. male.   The history is provided by the EMS personnel, medical records and a relative. No language interpreter was used.  Cardiac Arrest Witnessed by:  Not witnessed Incident location:  Work Condition upon EMS arrival:  Unresponsive Pulse:  Absent Cardiac rhythm: shickable rhythm of some kind. Treatments prior to arrival:  AED discharged and ACLS protocol Airway:  Bag valve mask      Prior to Admission medications  Medication Sig Start Date End Date Taking? Authorizing Provider  acyclovir  (ZOVIRAX ) 400 MG tablet Take 1 tablet (400 mg total) by mouth daily. Patient taking differently: Take 400 mg by mouth daily. Takes 2 tablets2 times per day 03/18/23   Lonn Hicks, MD  calcium  carbonate (TUMS - DOSED IN MG ELEMENTAL CALCIUM ) 500 MG chewable tablet Chew 1 tablet by mouth 3 (three) times daily.    [provider]  cholecalciferol (VITAMIN D3) 25 MCG (1000 UT) tablet Take 1,000 Units by mouth daily.    [provider]  empagliflozin (JARDIANCE) 10 MG TABS tablet Take 10 mg by mouth daily. 09/01/23   [provider]  entecavir  (BARACLUDE ) 0.5 MG tablet Take 0.5 mg by mouth daily.    [provider]  folic acid (FOLVITE) 1 MG tablet Take 1 mg by mouth daily. 11/14/23   [provider]  losartan  (COZAAR ) 50 MG tablet TAKE 1 TABLET(50 MG) BY MOUTH DAILY Patient not taking: Reported on 11/04/2023 06/25/23   Lonn Hicks, MD  metoprolol succinate (TOPROL-XL) 25 MG 24 hr tablet Take 25 mg by mouth daily. 11/01/23   [provider]  Multiple Vitamin (MULTI-VITAMIN) tablet Take 1 tablet by mouth daily. 11/14/23   [provider]  ondansetron  (ZOFRAN ) 8 MG tablet Take 1 tablet (8 mg total) by mouth every 8 (eight) hours as needed  for nausea or vomiting. 11/08/21   Lonn Hicks, MD  prochlorperazine  (COMPAZINE ) 10 MG tablet Take 1 tablet (10 mg total) by mouth every 6 (six) hours as needed for nausea or vomiting. 11/08/21   Lonn Hicks, MD  spironolactone (ALDACTONE) 25 MG tablet Take 25 mg by mouth daily. 10/03/23 10/02/24  [provider]  sulfamethoxazole -trimethoprim  (BACTRIM  DS) 800-160 MG tablet Take 1 tablet by mouth 3 (three) times a week. 11/14/23 05/12/24  [provider]    Allergies: Daratumumab  and Heparin     Review of Systems  Unable to perform ROS: Patient unresponsive    Updated Vital Signs BP (!) 135/93   Pulse 94   Resp 18   SpO2 100%   Physical Exam Vitals and nursing note reviewed.  Constitutional:      General: He is in acute distress.     Appearance: He is well-developed. He is ill-appearing.  HENT:     Head: Normocephalic and atraumatic.     Mouth/Throat:     Mouth: Mucous membranes are dry.  Eyes:     Conjunctiva/sclera: Conjunctivae normal.  Cardiovascular:     Rate and Rhythm: Regular rhythm. Tachycardia present.     Heart sounds: No murmur heard. Pulmonary:     Effort: Respiratory distress present.     Breath sounds: Rhonchi present. No rales.  Chest:     Chest wall: No tenderness.  Abdominal:     Palpations: Abdomen is soft.  Tenderness: There is no abdominal tenderness.  Musculoskeletal:        General: No swelling or tenderness.     Cervical back: Neck supple. No tenderness.  Skin:    General: Skin is warm and dry.     Capillary Refill: Capillary refill takes less than 2 seconds.     Findings: No erythema or rash.  Neurological:     Mental Status: He is unresponsive.     GCS: GCS eye subscore is 1. GCS verbal subscore is 1. GCS motor subscore is 1.     (all labs ordered are listed, but only abnormal results are displayed) Labs Reviewed  RESP PANEL BY RT-PCR (RSV, FLU A&B, COVID)  RVPGX2 - Abnormal; Notable for the following components:       Result Value   SARS Coronavirus 2 by RT PCR POSITIVE (*)    All other components within normal limits  BASIC METABOLIC PANEL WITH GFR - Abnormal; Notable for the following components:   Potassium 2.8 (*)    CO2 17 (*)    Glucose, Bld 148 (*)    Calcium  8.0 (*)    Anion gap 16 (*)    All other components within normal limits  CBC - Abnormal; Notable for the following components:   RBC 3.43 (*)    Hemoglobin 11.1 (*)    HCT 33.7 (*)    All other components within normal limits  HEPATIC FUNCTION PANEL - Abnormal; Notable for the following components:   Total Protein 5.6 (*)    AST 69 (*)    ALT 78 (*)    All other components within normal limits  LIPASE, BLOOD - Abnormal; Notable for the following components:   Lipase 86 (*)    All other components within normal limits  MAGNESIUM  - Abnormal; Notable for the following components:   Magnesium  1.5 (*)    All other components within normal limits  I-STAT CHEM 8, ED - Abnormal; Notable for the following components:   Potassium 2.8 (*)    Creatinine, Ser 1.30 (*)    Glucose, Bld 153 (*)    Calcium , Ion 1.11 (*)    TCO2 17 (*)    Hemoglobin 11.6 (*)    HCT 34.0 (*)    All other components within normal limits  I-STAT CG4 LACTIC ACID, ED - Abnormal; Notable for the following components:   Lactic Acid, Venous 4.8 (*)    All other components within normal limits  I-STAT ARTERIAL BLOOD GAS, ED - Abnormal; Notable for the following components:   pH, Arterial 7.333 (*)    pO2, Arterial 256 (*)    Acid-base deficit 5.0 (*)    Potassium 2.6 (*)    HCT 34.0 (*)    Hemoglobin 11.6 (*)    All other components within normal limits  I-STAT CG4 LACTIC ACID, ED - Abnormal; Notable for the following components:   Lactic Acid, Venous 2.4 (*)    All other components within normal limits  TROPONIN T, HIGH SENSITIVITY - Abnormal; Notable for the following components:   Troponin T High Sensitivity 25 (*)    All other components within normal  limits  TROPONIN T, HIGH SENSITIVITY - Abnormal; Notable for the following components:   Troponin T High Sensitivity 401 (*)    All other components within normal limits  CULTURE, BLOOD (ROUTINE X 2)  CULTURE, BLOOD (ROUTINE X 2)  CULTURE, RESPIRATORY W GRAM STAIN  MRSA NEXT GEN BY PCR, NASAL  TSH  PRO  BRAIN NATRIURETIC PEPTIDE  ETHANOL  URINE DRUG SCREEN  TRIGLYCERIDES  BLOOD GAS, ARTERIAL  CBC  BASIC METABOLIC PANEL WITH GFR  MAGNESIUM   PHOSPHORUS  HEPATIC FUNCTION PANEL  HIV ANTIBODY (ROUTINE TESTING W REFLEX)  CBC  COMPREHENSIVE METABOLIC PANEL WITH GFR  MAGNESIUM   PHOSPHORUS    EKG: EKG Interpretation Date/Time:  Tuesday February 10 2024 18:57:08 EST Ventricular Rate:  122 PR Interval:  138 QRS Duration:  93 QT Interval:  319 QTC Calculation: 455 R Axis:   41  Text Interpretation: Sinus tachycardia Paired ventricular premature complexes Borderline repol abnrm, anterolateral leads no prior ECG for comparison No STEMI Confirmed by Ginger Barefoot ([id number]) on 02/10/2024 7:04:42 PM  Radiology: [facility] CHEST PORT 1 VIEW Result Date: 02/10/2024 EXAM: 1 VIEW(S) XRAY OF THE CHEST 02/10/2024 10:52:00 PM COMPARISON: CT chest dated 02/10/2024 07:27 PM. CLINICAL HISTORY: [id number] History of ETT [id number] History of endotracheal tube (ETT) placement. FINDINGS: LINES, TUBES AND DEVICES: Endotracheal tube terminates 1 cm over the carina. Enteric tube courses below the hemidiaphragm with side port overlying the gastric lumen region and tip collimated off view. LUNGS AND PLEURA: Low lung volumes. Bibasilar atelectasis. No pleural effusion. No pneumothorax. HEART AND MEDIASTINUM: No acute abnormality of the cardiac and mediastinal silhouettes. BONES AND SOFT TISSUES: No acute osseous abnormality. IMPRESSION: 1. Endotracheal tube tip terminates 1 cm above the carina and could be retracted by 1cm. 2. Enteric tube in godo position 3. Low lung volumes with bibasilar atelectasis. Electronically signed by:  Morgane Naveau MD 02/10/2024 10:57 PM EST RP Workstation: [id number]   CT ANGIO HEAD NECK W WO CM Result Date: 02/10/2024 CLINICAL DATA:  Initial evaluation for acute altered mental status, prior arrest. EXAM: CT ANGIOGRAPHY HEAD AND NECK WITH AND WITHOUT CONTRAST TECHNIQUE: Multidetector CT imaging of the head and neck was performed using the standard protocol during bolus administration of intravenous contrast. Multiplanar CT image reconstructions and MIPs were obtained to evaluate the vascular anatomy. Carotid stenosis measurements (when applicable) are obtained utilizing NASCET criteria, using the distal internal carotid diameter as the denominator. RADIATION DOSE REDUCTION: This exam was performed according to the departmental dose-optimization program which includes automated exposure control, adjustment of the mA and/or kV according to patient size and/or use of iterative reconstruction technique. CONTRAST:  75mL OMNIPAQUE  IOHEXOL  350 MG/ML SOLN COMPARISON:  Prior exam from 10/01/2011. FINDINGS: CT HEAD FINDINGS Brain: Cerebral volume within normal limits for patient age. No acute intracranial hemorrhage. No acute large vessel territory infarct. No mass lesion, midline shift, or mass effect. Ventricles are normal in size without hydrocephalus. No extra-axial fluid collection. Vascular: No abnormal hyperdense vessel. Skull: Scalp soft tissues demonstrate no acute abnormality. Calvarium intact. Sinuses/Orbits: Globes and orbital soft tissues within normal limits. Moderate mucosal thickening throughout the visualized sphenoid ethmoidal sinuses. No significant mastoid effusion. Patient is intubated. CTA NECK FINDINGS Aortic arch: Standard branching. Imaged portion shows no evidence of aneurysm or dissection. No significant stenosis of the major arch vessel origins. Right carotid system: No evidence of dissection, stenosis (50% or greater), or occlusion. Left carotid system: No evidence of dissection, stenosis  (50% or greater), or occlusion. Vertebral arteries: Neither vertebral artery well seen proximally due to streak artifact. Right vertebral artery slightly dominant. Visualized vertebral arteries patent without stenosis or dissection. Skeleton: Multiple lucent lesion seen throughout the visualized spine, consistent with history of multiple myeloma. Mild chronic height loss about the T3 and T4 vertebral bodies. Other neck: Endotracheal and enteric tubes in place. The balloon for the endotracheal  to appears over inflated at the supraglottic larynx (series 2, image 66). Upper chest: Right lower lobe atelectasis and/or consolidation. Mild subsegmental atelectasis at the dependent left lung. No other acute finding. Review of the MIP images confirms the above findings CTA HEAD FINDINGS Anterior circulation: Evaluation of the intracranial circulation limited by timing of the contrast bolus. Both internal carotid arteries are patent to the siphons without stenosis or other visible abnormality. A1 segments patent bilaterally. Normal anterior communicating complex. Azygous ACA noted. ACAs patent without visible stenosis. No M1 stenosis or occlusion. No proximal MCA branch occlusion or high-grade stenosis. Distal MCA branches perfused and symmetric. Posterior circulation: Both V4 segments patent without significant stenosis. Right vertebral artery slightly dominant. Neither PICA well visualized. Basilar patent without stenosis. Superior cerebral arteries patent at their origins. Both PCAs patent without visible stenosis. Venous sinuses: Grossly patent allowing for timing the contrast bolus. Anatomic variants: Predominant azygous ACA.  No visible aneurysm. Review of the MIP images confirms the above findings IMPRESSION: CT HEAD: No acute intracranial abnormality. CTA HEAD AND NECK: 1. Negative CTA of the head and neck. No large vessel occlusion or other emergent finding. No hemodynamically significant or correctable stenosis. 2.  Multiple lucent lesions throughout the visualized osseous structures, consistent with history of multiple myeloma. 3. Endotracheal and enteric tubes in place. The balloon for the endotracheal tube appears over inflated at the supraglottic larynx. Bedside check and adjustment recommended. 4. Right lower lobe atelectasis and/or consolidation. Electronically Signed   By: Morene Hoard M.D.   On: 02/10/2024 21:31   CT Angio Chest PE W and/or Wo Contrast Result Date: 02/10/2024 EXAM: CTA of the Chest with contrast for PE 02/10/2024 07:58:00 PM TECHNIQUE: CTA of the chest was performed after the administration of 75 mL of Omnipaque  350 MG/ML injection. Multiplanar reformatted images are provided for review. MIP images are provided for review. Automated exposure control, iterative reconstruction, and/or weight based adjustment of the mA/kV was utilized to reduce the radiation dose to as low as reasonably achievable. COMPARISON: None available. CLINICAL HISTORY: Recent CPR with chest pain, initial encounter. FINDINGS: PULMONARY ARTERIES: Degree of pulmonary arterial opacification is somewhat limited, although no large central pulmonary embolus is seen. Main pulmonary artery is normal in caliber. MEDIASTINUM: Endotracheal tube and nasogastric catheter are noted in satisfactory position. The heart is not significantly enlarged in size. The pericardium demonstrates no acute abnormality. The aorta shows no aneurysmal dilatation. The esophagus, as visualized, is unremarkable. LYMPH NODES: No mediastinal, hilar or axillary lymphadenopathy. LUNGS AND PLEURA: The lungs demonstrate bilateral lower lobe consolidation, right greater than left. Posterior consolidation in the right upper lobe is noted as well. No parenchymal nodules are noted. No sizable effusion is seen. No pneumothorax. UPPER ABDOMEN: Limited images of the upper abdomen are unremarkable. SOFT TISSUES AND BONES: Compression deformities are noted at T4 and T6,  consistent with the patient's given clinical history of multiple myeloma. Additionally, changes of prior T6 laminectomy are noted. Scattered lucencies are seen, consistent with the patient's given clinical history. No acute soft tissue abnormality. IMPRESSION: 1. No large central pulmonary embolus is seen, although pulmonary arterial opacification is somewhat limited. 2. Bilateral lower lobe consolidation, right greater than left, and posterior consolidation in the right upper lobe, without sizable pleural effusion. Electronically signed by: Oneil Devonshire MD 02/10/2024 08:25 PM EST RP Workstation: [id number] Portable 1 View Result Date: 02/10/2024 CLINICAL DATA:  Enteric catheter placement EXAM: PORTABLE ABDOMEN - 1 VIEW COMPARISON:  None Available.  FINDINGS: Frontal view of the upper abdomen was obtained. Enteric catheter tip and side port project over the gastric body. Bowel gas pattern is unremarkable. IMPRESSION: 1. Enteric catheter tip projecting over the gastric body. Electronically Signed   By: Ozell Daring M.D.   On: 02/10/2024 19:43   DG Chest Port 1 View Result Date: 02/10/2024 CLINICAL DATA:  Intubated EXAM: PORTABLE CHEST 1 VIEW COMPARISON:  06/24/2019 FINDINGS: Single frontal view of the chest demonstrates endotracheal tube overlying tracheal air column, with tip projecting 10 cm above the carina. Recommend advancing 6-7 cm. Enteric catheter passes below diaphragm, tip excluded by collimation but side port projecting over the gastric fundus. The cardiac silhouette is unremarkable. Areas of linear consolidation are seen within the right upper and left lower lobes, favoring subsegmental atelectasis. No airspace disease, effusion, or pneumothorax. No acute bony abnormalities. IMPRESSION: 1. Endotracheal tube tip projecting 10 cm above carina. Recommend advancing 6-7 cm. 2. Bilateral hypoventilatory changes.  No acute airspace disease. Electronically Signed   By: Ozell Daring M.D.   On:  02/10/2024 19:41     Procedures   CRITICAL CARE Performed by: Lonni PARAS Barnaby Rippeon Total critical care time: 40 minutes Critical care time was exclusive of separately billable procedures and treating other patients. Critical care was necessary to treat or prevent imminent or life-threatening deterioration. Critical care was time spent personally by me on the following activities: development of treatment plan with patient and/or surrogate as well as nursing, discussions with consultants, evaluation of patient's response to treatment, examination of patient, obtaining history from patient or surrogate, ordering and performing treatments and interventions, ordering and review of laboratory studies, ordering and review of radiographic studies, pulse oximetry and re-evaluation of patient's condition.  Medications Ordered in the ED  propofol  (DIPRIVAN ) 1000 MG/100ML infusion (40 mcg/kg/min  107 kg Intravenous Rate/Dose Change 02/10/24 2055)  potassium chloride  10 mEq in 100 mL IVPB (10 mEq Intravenous New Bag/Given 02/10/24 2146)  fentaNYL  (SUBLIMAZE ) injection 25-50 mcg ( Intravenous Not Given 02/10/24 2143)  fentaNYL  in NS (10mcg/ml) infusion-PREMIX (100 mcg/hr Intravenous Rate/Dose Change 02/10/24 2051)  fentaNYL  (SUBLIMAZE ) bolus via infusion 25-100 mcg (100 mcg Intravenous Bolus from Bag 02/10/24 2217)  senna (SENOKOT) tablet 8.6 mg (has no administration in time range)  polyethylene glycol (MIRALAX  / GLYCOLAX ) packet 17 g (has no administration in time range)  ondansetron  (ZOFRAN ) injection 4 mg (has no administration in time range)  acetaminophen  (TYLENOL ) tablet 650 mg (has no administration in time range)    Or  acetaminophen  (TYLENOL ) 160 MG/5ML solution 650 mg (has no administration in time range)    Or  acetaminophen  (TYLENOL ) suppository 650 mg (has no administration in time range)  famotidine  (PEPCID ) tablet 20 mg (has no administration in time range)  Chlorhexidine   Gluconate Cloth 2 % PADS 6 each (6 each Topical Given 02/10/24 2215)  fondaparinux  (ARIXTRA ) injection 2.5 mg (has no administration in time range)  cefTRIAXone  (ROCEPHIN ) 2 g in sodium chloride  0.9 % 100 mL IVPB (has no administration in time range)  0.9 %  sodium chloride  infusion (has no administration in time range)  norepinephrine  (LEVOPHED ) 4mg  in (0.016 mg/mL) premix infusion (has no administration in time range)  etomidate  (AMIDATE ) injection (20 mg Intravenous Given 02/10/24 1851)  rocuronium  (ZEMURON ) injection (80 mg Intravenous Given 02/10/24 1852)  levETIRAcetam  (KEPPRA ) undiluted injection 3,000 mg (3,000 mg Intravenous Given 02/10/24 1909)  iohexol  (OMNIPAQUE ) 350 MG/ML injection 75 mL (75 mLs Intravenous Contrast Given 02/10/24 1949)  magnesium   sulfate IVPB 2 g 50 mL (2 g Intravenous New Bag/Given 02/10/24 2045)    Clinical Course as of 02/10/24 2313  Tue Feb 10, 2024  1931 DG Chest [place] 1 View [JT]    Clinical Course User Index [JT] Donnajean Lynwood DEL, [place]                                 Medical Decision Making Amount and/or Complexity of Data Reviewed Labs: ordered. Radiology: ordered.  Risk Prescription drug management. Decision regarding hospitalization.    Bradley Hunt is a 54 y.o. male with past medical history significant for multiple myeloma, previous bony metastasis, and previous pancytopenia who presents for cardiac arrest.  According to EMS report, patient was at his shop and was reportedly in the bathroom and someone heard a thump at about 6 PM.  Several minutes later they went to check on him and he was unresponsive.  Fire arrived to the scene first and an AED was utilized and he did have a shockable rhythm and received 1 defibrillation.  He also received about 10 minutes of CPR around the time of defibrillation and had ROSC by the time EMS arrived.  Last known well was at 5:30 PM.    During transport, patient initially had an Igel placed with then  started gagging.  He then was getting bagged and started having posturing and shaking concerning for seizure.  They report his glucose was 146 and not critically low.  On arrival, GCS is 3.  He is having some occasional posturing and shaking concerning for seizure activity.  Decision made to intubate as he is not protecting his airway.  Patient intubated without difficulty by [facility].  Patient then had broad workup to look for concerning etiology of cardiac arrest.  On further exam he did have some coarse breath sounds but did not respond to painful stimuli.  I did not see evidence of clear trauma but he arrived in a collar which we will keep in place due to this possible fall.  Will get CT imaging of his head and neck including the CTA to make sure this is not a stroke.  I did call neurology who did not feel this was a code stroke based on description so we will hold on activation.  Due to the cardiac arrest and shockable rhythm, cardiology was called who came to the bedside to see the patient.  Critical care will see and admit.  Will get a CT PE study as well given his cancer history and sudden cardiac arrest.  Anticipate admission to critical care for further management.  He did get hypotensive but then improved.  Will have Levophed  available if needed for hypotension.  Critical care will admit for further management with cardiology and neurology following.    Family arrived to the emergency department and I updated them on his workup thus far and that he will be getting admitted.      Final diagnoses:  Cardiac arrest [facility])  Posturing episode  Seizure (HCC)  Fall, initial encounter  Unresponsive   Clinical Impression: 1. Cardiac arrest (HCC)   2. Posturing episode   3. Seizure (HCC)   4. Fall, initial encounter   5. Unresponsive     Disposition: Admit  This note was prepared with assistance of Dragon voice recognition software. Occasional wrong-word or sound-a-like  substitutions may have occurred due to the inherent limitations of voice recognition software.  Bradley Hunt, Lonni PARAS, MD 02/10/24 [phone]  "

## 2024-02-10 NOTE — Consult Note (Signed)
 NEUROLOGY CONSULT NOTE   Date of service: 02/23/2024 Patient Name: Bradley Hunt MRN:  [id number] DOB:  1970-05-02 Chief Complaint: Concern for posturing versus seizure Requesting Provider: Tegeler, Lonni PARAS, *  History of Present Illness  Bradley Hunt is a 54 y.o. 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  He was found by EMS down and had a shockable rhythm.  After arrival to the emergency department, he was noted to have some posturing like movements and neurology was consulted out of concerns for seizures.   Past History   Past Medical History:  Diagnosis Date   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   History of chemotherapy    History of radiation therapy    Multiple myeloma not having achieved remission (HCC) 06/25/15   Numbness    lower extermities bilat     Past Surgical History:  Procedure Laterality Date   ANTERIOR CRUCIATE LIGAMENT REPAIR     IR FLUORO GUIDE PORT INSERTION RIGHT  05/16/2016   IR REMOVAL TUN ACCESS W/ PORT W/O FL MOD SED  07/23/2017   IR US  GUIDE VASC ACCESS RIGHT  05/16/2016   LAMINECTOMY N/A 06/25/2015   Procedure: Thoracic six LAMINECTOMY RESECTION FOR TUMOR;  Surgeon: Gerldine Maizes, MD;  Location: [facility];  Service: Neurosurgery;  Laterality: N/A;    Family History: Family History  Problem Relation Age of Onset   Cancer Neg Hx     Social History  reports that he quit smoking about 27 years ago. His smoking use included cigarettes. He started smoking about 37 years ago. He has a 10 pack-year smoking history. He has never used smokeless tobacco. He reports that he does not drink alcohol and does not use drugs.  Allergies[1]  Medications  Current Medications[2]  Vitals   Vitals:   23-Feb-2024 1958 02-23-24 1959 02/23/2024 2000 2024/02/23 2003  BP:   (!) 142/96   Pulse: (!) 102 94 (!) 39 (!) 105  Resp: 19 19 18 18   Temp: 97.8 F (36.6 C)  97.8 F (36.6 C) 97.7 F (36.5 C) 97.7 F (36.5 C)  SpO2: 100% 100% 100% 100%  Weight:      Height:        Body mass index is 30.29 kg/m.   Physical Exam   Constitutional: Appears well-developed and well-nourished.  Neurologic Examination    Neuro: Mental Status: Patient is obtunded, does not open eyes or follow commands Cranial Nerves: II: Does not point to threat. Pupils are equal, round, and reactive to light.   III,IV, VI: Doll's intact V: VII: Corneals are intact Motor: He has minimal flicker to noxious stimulation in the left upper extremity, as well as subtle shivering type movements which could be subtle posturing versus shivering sensory: As above Cerebellar: Does not perform       Labs/Imaging/Neurodiagnostic studies   CBC:  Recent Labs  Lab 2024-02-23 1809 2024/02/23 1922 02/23/2024 2009  WBC 8.8  --   --   HGB 11.1* 11.6* 11.6*  HCT 33.7* 34.0* 34.0*  MCV 98.3  --   --   PLT 177  --   --    Basic Metabolic Panel:  Lab Results  Component Value Date   NA 143 2024-02-23   K 2.6 (LL) Feb 23, 2024   CO2 28 12/09/2023   GLUCOSE 153 (H) 2024/02/23   BUN 11 02-23-2024   CREATININE 1.30 (H) 02/23/24  CALCIUM  9.7 12/09/2023   GFRNONAA >60 12/09/2023   GFRAA >60 10/18/2019   INR  Lab Results  Component Value Date   INR 1.12 07/23/2017   APTT  Lab Results  Component Value Date   APTT 27 07/08/2016   CT Head without contrast(Personally reviewed): Negative   ASSESSMENT   Bradley Hunt is a 54 y.o. male with abnormal movements following cardiac arrest.  These movements were captured on rapid limited montage EEG and did not appear to have any ictal correlate, so my suspicion is that this either represents posturing versus shivering.  A full montage EEG in the morning would likely be prudent, but no evidence of ongoing seizure right now.  RECOMMENDATIONS  Could use sedation to suppress movements. Full montage EEG in the morning If the  movements begin to appear more myoclonic, could load with Keppra . Neurology will follow ______________________________________________________________________    Signed, Aisha Seals, MD [facility]     [1]  Allergies Allergen Reactions   Daratumumab  Shortness Of Breath, Diarrhea, Itching and Hypertension    Only with IV daratumumab    Heparin  Other (See Comments)    For religious purposes, does not agree to Heparin  or other pork products  [2]  Current Facility-Administered Medications:    etomidate  (AMIDATE ) injection, , Intravenous, Code/Trauma/Sedation Med, Tegeler, Lonni PARAS, MD, 20 mg at 02/10/24 1851   norepinephrine  (LEVOPHED ) 4mg  in (0.016 mg/mL) premix infusion, 0-40 mcg/min, Intravenous, Continuous, Tegeler, Lonni PARAS, MD, Stopped at 02/10/24 2003   propofol  (DIPRIVAN ) 1000 MG/100ML infusion, 0-80 mcg/kg/min, Intravenous, Titrated, Tegeler, Lonni PARAS, MD, Last Rate: 6.42 mL/hr at 02/10/24 1913, 10 mcg/kg/min at 02/10/24 1913   rocuronium  (ZEMURON ) injection, , Intravenous, Code/Trauma/Sedation Med, Tegeler, Lonni PARAS, MD, 80 mg at 02/10/24 8147  Current Outpatient Medications:    acyclovir  (ZOVIRAX ) 400 MG tablet, Take 1 tablet (400 mg total) by mouth daily. (Patient taking differently: Take 400 mg by mouth daily. Takes 2 tablets2 times per day), Disp: 30 tablet, Rfl: 11   calcium  carbonate (TUMS - DOSED IN MG ELEMENTAL CALCIUM ) 500 MG chewable tablet, Chew 1 tablet by mouth 3 (three) times daily., Disp: , Rfl:    cholecalciferol (VITAMIN D3) 25 MCG (1000 UT) tablet, Take 1,000 Units by mouth daily., Disp: , Rfl:    empagliflozin (JARDIANCE) 10 MG TABS tablet, Take 10 mg by mouth daily., Disp: , Rfl:    entecavir  (BARACLUDE ) 0.5 MG tablet, Take 0.5 mg by mouth daily., Disp: , Rfl:    folic acid (FOLVITE) 1 MG tablet, Take 1 mg by mouth daily., Disp: , Rfl:    losartan  (COZAAR ) 50 MG tablet, TAKE 1 TABLET(50 MG) BY MOUTH DAILY (Patient  not taking: Reported on 11/04/2023), Disp: 30 tablet, Rfl: 1   metoprolol succinate (TOPROL-XL) 25 MG 24 hr tablet, Take 25 mg by mouth daily., Disp: , Rfl:    Multiple Vitamin (MULTI-VITAMIN) tablet, Take 1 tablet by mouth daily., Disp: , Rfl:    ondansetron  (ZOFRAN ) 8 MG tablet, Take 1 tablet (8 mg total) by mouth every 8 (eight) hours as needed for nausea or vomiting., Disp: 30 tablet, Rfl: 1   prochlorperazine  (COMPAZINE ) 10 MG tablet, Take 1 tablet (10 mg total) by mouth every 6 (six) hours as needed for nausea or vomiting., Disp: 30 tablet, Rfl: 1   spironolactone (ALDACTONE) 25 MG tablet, Take 25 mg by mouth daily., Disp: , Rfl:    sulfamethoxazole -trimethoprim  (BACTRIM  DS) 800-160 MG tablet, Take 1 tablet by mouth 3 (three) times a week., Disp: ,  Rfl:

## 2024-02-10 NOTE — TOC CM/SW Note (Signed)
 TOC consult received for advance directive/NOK information. Per chart, no advance directive is on file.   Contacts listed are patient's spouse, Yamato Kopf [phone]) and patient's sister, Finley Lek (same phone number as spouse).   Merilee Batty, MSN, RN Case Management [phone]

## 2024-02-10 NOTE — Progress Notes (Signed)
 ETT advanced 3cm from 25 to 28 cm at the lip. Equal BS B/L. CXR pending. spO2 100% ETT pulled back 2cm  from 28 to 26 at the lip. BLBS =. SpO2 100%.  Weaned O2 to 50%

## 2024-02-10 NOTE — H&P (Cosign Needed Addendum)
 "  NAMEAlejos Hunt, MRN:  [id number], DOB:  February 02, 1970, LOS: 0 ADMISSION DATE:  02/10/2024, CONSULTATION DATE:  1/13 REFERRING MD:  Dr. 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  S/p autologous stem cells 2018. HFrEF previously on Entresto, which was stopped during a previous admission for hypotension. Family reports he had been doing well. No complaints at all. In his usual state of health when a friend heard him collapse in the bathroom and called EMS. The patient was unresponsive. Upon EMS arrival the patient was pulseless and CPR was initiated. Shockable rhythm identified and he was shocked x 1. CPR duration a total of 10 mins. LMA was placed in the field and was removed when the patient began to gag. Remained unresponsive in the ED and was intubated for airway protection. Some concern for seizure vs posturing in the ED as well. Neurology and cardiology consulted. Ceribell EEG in ED negative for seizure. CTA chest and head ordered. Low dose norepi required. PCCM asked to admit.   Pertinent  Medical History   has a past medical history of Bone metastases, History of chemotherapy, History of radiation therapy, Multiple myeloma not having achieved remission (HCC) (06/25/15), and Numbness.   Significant Hospital Events: Including procedures, antibiotic start and stop dates in addition to other pertinent events   1/13 admit following cardiac arrest  Interim History / Subjective:    Objective    Blood pressure 95/69, pulse 92, temperature (!) 97.2 F (36.2 C), resp. rate 18, height 6' 2 (1.88 m), weight 107 kg, SpO2 100%.    Vent Mode: PRVC FiO2 (%):  [100 %] 100 % Set Rate:  [18 bmp] 18 bmp Vt Set:  [660 mL] 660 mL PEEP:  [5 cmH20] 5 cmH20 Plateau Pressure:  [19 cmH20] 19 cmH20  No intake or output data in the 24 hours  ending 02/10/24 1938 Filed Weights   02/10/24 1900  Weight: 107 kg    Examination: General: Middle aged male in NAD HENT: [place]/AT, PERRL, no JVD Lungs: Low pitched wheeze in the lower lung fields Cardiovascular: RRR, no MRG Abdomen: Soft, non-distended. Hypoactive Extremities: No acute deformity or edema Neuro: Alert, oriented, non-focal  CTA chest: poorly contrasted study. No large central pulmonary embolus seen. Bilateral consolidation.  Resolved problem list   Assessment and Plan    Cardiac arrest: Witnessed event. Shock able rhythm identified. Defib x 1 in field. 10 mins CPR. No bystander CPR. So far most likely etiology is hypokalemia vs hypomagnesemia. Anoxic encephalopathy - Admit to ICU - Echocardiogram - Appreciate cardiology, neurology - TTM protocol, avoid fever. Currently normothermic - CTA head pending.  - Briefly on levo in ED. Now off.   Acute respiratory failure with hypoxia - Full vent support - ABG reviewed, FiO2 decreased.  - VAP bundle  Aspiration PNA - ceftriaxone  - Blood and sputum cultures.   Hypokalemia. K 2.8 presenting Hypomagnesemia - 6 runs K, repeat BMP - 2 G mag  Chronic HFrEF: previously on Entresto. Stopped recently. POCUS by cardiology EF estimated 35-40% with global hypokinesis.  - holding home jardiance, losartan , metoprolol, aldactone  Transaminitis: likely 2/2 cardiac arrest - trend  Lactic acidosis - hemodynamic support as above - Trend lactic  COVID 19 positive upon admisson Norovirus positive 1/6 [facility] Rotaviurs positive 1/6 [facility] Enteroaggregative E. Coli PCR positive [facility] 1/6 - Supportive care - Gentle hydration  ?  Sz history: keppra  on pharmacy dispense report - no sz seen on EEG.   Multiple myeloma: recent stem cell infusion - supportive care  Fondaparinux  for VTE prophylaxis. No pork products  Family updated in ED including the patients wife. PCP Dr. Sim also present. Full Code.    Labs    CBC: Recent Labs  Lab 02/10/24 1922  HGB 11.6*  HCT 34.0*    Basic Metabolic Panel: Recent Labs  Lab 02/10/24 1922  NA 144  K 2.8*  CL 108  GLUCOSE 153*  BUN 11  CREATININE 1.30*   GFR: Estimated Creatinine Clearance: 84.6 mL/min (A) (by C-G formula based on SCr of 1.3 mg/dL (H)). Recent Labs  Lab 02/10/24 1921  LATICACIDVEN 4.8*    Liver Function Tests: No results for input(s): AST, ALT, ALKPHOS, BILITOT, PROT, ALBUMIN in the last 168 hours. No results for input(s): LIPASE, AMYLASE in the last 168 hours. No results for input(s): AMMONIA in the last 168 hours.  ABG    Component Value Date/Time   TCO2 17 (L) 02/10/2024 1922     Coagulation Profile: No results for input(s): INR, PROTIME in the last 168 hours.  Cardiac Enzymes: No results for input(s): CKTOTAL, CKMB, CKMBINDEX, TROPONINI in the last 168 hours.  HbA1C: No results found for: HGBA1C  CBG: No results for input(s): GLUCAP in the last 168 hours.  Review of Systems:   Patient is encephalopathic and/or intubated; therefore, history has been obtained from chart review.    Past Medical History:  He,  has a past medical history of Bone metastases, History of chemotherapy, History of radiation therapy, Multiple myeloma not having achieved remission (HCC) (06/25/15), and Numbness.   Surgical History:   Past Surgical History:  Procedure Laterality Date   ANTERIOR CRUCIATE LIGAMENT REPAIR     IR FLUORO GUIDE PORT INSERTION RIGHT  05/16/2016   IR REMOVAL TUN ACCESS W/ PORT W/O FL MOD SED  07/23/2017   IR US  GUIDE VASC ACCESS RIGHT  05/16/2016   LAMINECTOMY N/A 06/25/2015   Procedure: Thoracic six LAMINECTOMY RESECTION FOR TUMOR;  Surgeon: Gerldine Maizes, MD;  Location: [facility];  Service: Neurosurgery;  Laterality: N/A;     Social History:   reports that he quit smoking about 27 years ago. His smoking use included cigarettes. He started smoking about 37 years  ago. He has a 10 pack-year smoking history. He has never used smokeless tobacco. He reports that he does not drink alcohol and does not use drugs.   Family History:  His family history is negative for Cancer.   Allergies Allergies[1]   Home Medications  Prior to Admission medications  Medication Sig Start Date End Date Taking? Authorizing Provider  acyclovir  (ZOVIRAX ) 400 MG tablet Take 1 tablet (400 mg total) by mouth daily. Patient taking differently: Take 400 mg by mouth daily. Takes 2 tablets2 times per day 03/18/23   Lonn Hicks, MD  calcium  carbonate (TUMS - DOSED IN MG ELEMENTAL CALCIUM ) 500 MG chewable tablet Chew 1 tablet by mouth 3 (three) times daily.    [provider]  cholecalciferol (VITAMIN D3) 25 MCG (1000 UT) tablet Take 1,000 Units by mouth daily.    [provider]  empagliflozin (JARDIANCE) 10 MG TABS tablet Take 10 mg by mouth daily. 09/01/23   [provider]  entecavir  (BARACLUDE ) 0.5 MG tablet Take 0.5 mg by mouth daily.    [provider]  folic acid (FOLVITE) 1 MG tablet Take 1 mg by mouth daily.  11/14/23   [provider]  losartan  (COZAAR ) 50 MG tablet TAKE 1 TABLET(50 MG) BY MOUTH DAILY Patient not taking: Reported on 11/04/2023 06/25/23   Lonn Hicks, MD  metoprolol succinate (TOPROL-XL) 25 MG 24 hr tablet Take 25 mg by mouth daily. 11/01/23   [provider]  Multiple Vitamin (MULTI-VITAMIN) tablet Take 1 tablet by mouth daily. 11/14/23   [provider]  ondansetron  (ZOFRAN ) 8 MG tablet Take 1 tablet (8 mg total) by mouth every 8 (eight) hours as needed for nausea or vomiting. 11/08/21   Lonn Hicks, MD  prochlorperazine  (COMPAZINE ) 10 MG tablet Take 1 tablet (10 mg total) by mouth every 6 (six) hours as needed for nausea or vomiting. 11/08/21   Lonn Hicks, MD  spironolactone (ALDACTONE) 25 MG tablet Take 25 mg by mouth daily. 10/03/23 10/02/24  [provider]  sulfamethoxazole -trimethoprim   (BACTRIM  DS) 800-160 MG tablet Take 1 tablet by mouth 3 (three) times a week. 11/14/23 05/12/24  [provider]     Critical care time: 62 minutes              [1]  Allergies Allergen Reactions   Daratumumab  Shortness Of Breath, Diarrhea, Itching and Hypertension    Only with IV daratumumab    Heparin  Other (See Comments)    For religious purposes, does not agree to Heparin  or other pork products   "

## 2024-02-10 NOTE — Procedures (Signed)
 History: 54 year old male status post cardiac arrest  EEG Duration: 23 minutes  Sedation: Propofol  10 mcg/kg/min  Patient State: Comatose  Technique: This EEG was a limited montage 8 channel EEG excluding the parasagittal leads (Ceribell).    Background: The background consists of generalized irregular intermixed delta and theta range activity.  There are a few brief(< 1 second) generalized runs of beta activity as well.  His abnormal movements were witnessed with only myogenic artifact on EEG.  There is also persistent cardiac artifact throughout the study.  Photic stimulation: Physiologic driving is not performed  EEG Abnormalities: 1) generalized irregular slow activity 2) absent posterior dominant rhythm  Clinical Interpretation: This EEG was consistent with a generalized nonspecific cerebral dysfunction (encephalopathy).  This study does not support an epileptic nature to the abnormal movements.  There was no seizure or seizure predisposition recorded on this study. Please note that lack of epileptiform activity on EEG does not preclude the possibility of epilepsy.   Aisha Seals, MD [facility]   If 7pm- 7am, please page neurology on call as listed in AMION.

## 2024-02-10 NOTE — ED Provider Notes (Signed)
" °  Physical Exam  BP 95/69   Pulse 92   Temp (!) 97.2 F (36.2 C)   Resp 18   Ht 6' 2 (1.88 m)   Wt 107 kg   SpO2 100%   BMI 30.29 kg/m    Procedures  Procedure Name: Intubation Date/Time: 02/10/2024 7:55 PM  Performed by: Donnajean Lynwood DEL, PA-CPre-anesthesia Checklist: Patient identified, Emergency Drugs available, Suction available, Patient being monitored and Timeout performed Oxygen Delivery Method: Ambu bag Preoxygenation: Pre-oxygenation with 100% oxygen Induction Type: IV induction, Rapid sequence and Cricoid Pressure applied Ventilation: Mask ventilation without difficulty and Nasal airway inserted- appropriate to patient size Laryngoscope Size: 1 Endobronchial tube: 28 Fr Tube size: 7.0 mm Number of attempts: 1 Placement Confirmation: ETT inserted through vocal cords under direct vision, Positive ETCO2 and Breath sounds checked- equal and bilateral Tube secured with: ETT holder Difficulty Due To: Difficult Airway- due to large tongue            Donnajean Lynwood DEL, PA-C 02/10/24 1958    Tegeler, Lonni PARAS, MD 02/10/24 2240  "

## 2024-02-10 NOTE — ED Triage Notes (Addendum)
 [facility] found in bathroomm, found by friends unresponsive. Call at 1802, CPR at 1806, Shocked once with AED by Fire with ROSC at 1814.  Unknown downtime.  Breathing on own on arrival but not much other purposeful movement. Potential LKW of 1730. There was language barrier with friends who EMS talked with.   2.5 Versed  for seizure like activity  18G L. AC.   CBG 146 112/86 HR 80's, ETC02 20

## 2024-02-10 NOTE — Progress Notes (Signed)
 Transported pt from TRA C to 2H02 with bedside RN. Vitals are stable.

## 2024-02-10 NOTE — Consult Note (Signed)
 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  Per the ED provider, the patient's friends had heard a thump and the patient was found about 10 minutes later unresponsive in the bathroom EMS was called and patient had CPR and was found to have a shockable rhythm and received a shock x 1 with ROSC achieved.  He was intubated.  Cardiology was consulted.  Past Medical History: Past Medical History:  Diagnosis Date   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   History of chemotherapy    History of radiation therapy    Multiple myeloma not having achieved remission (HCC) 06/25/15   Numbness    lower extermities bilat     Past Surgical History: Past Surgical History:  Procedure Laterality Date   ANTERIOR CRUCIATE LIGAMENT REPAIR     IR FLUORO GUIDE PORT INSERTION RIGHT  05/16/2016   IR REMOVAL TUN ACCESS W/ PORT W/O FL MOD SED  07/23/2017   IR US  GUIDE VASC ACCESS RIGHT  05/16/2016   LAMINECTOMY N/A 06/25/2015   Procedure: Thoracic six LAMINECTOMY RESECTION FOR TUMOR;  Surgeon: Gerldine Maizes, MD;  Location: [facility];  Service: Neurosurgery;  Laterality: N/A;     Allergies:    Allergies[1]  Social History:   Social History   Socioeconomic History   Marital status: Married    Spouse name: Not on file   Number of children: 0   Years of education: Not on file   Highest education level: Not on file  Occupational History   Not on file  Tobacco Use   Smoking status: Former    Current packs/day:  0.00    Average packs/day: 1 pack/day for 10.0 years (10.0 ttl pk-yrs)    Types: Cigarettes    Start date: 01/29/1987    Quit date: 01/28/1997    Years since quitting: 27.0   Smokeless tobacco: Never  Vaping Use   Vaping status: Never Used  Substance and Sexual Activity   Alcohol use: No   Drug use: No   Sexual activity: Yes    Comment: friend Amadou next of kin. 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  Other Topics Concern   Not on file  Social History Narrative   Not on file   Social Drivers of Health   Tobacco Use: Medium Risk (12/09/2023)   Patient History    Smoking Tobacco Use: Former    Smokeless Tobacco Use: Never    Passive Exposure: Not on Actuary Strain: Not on file  Food Insecurity: No Food Insecurity (11/04/2023)   Epic    Worried About Programme Researcher, Broadcasting/film/video in the Last Year: Never true    Ran Out of Food in the Last Year: Never true  Transportation Needs: No Transportation Needs (11/04/2023)   Epic    Lack of Transportation (Medical): No    Lack of Transportation (Non-Medical): No  Physical Activity: Not on file  Stress: Not on file  Social Connections: Unknown (06/10/2021)   Received from [facility]   Social Network    Social  Network: Not on file  Intimate Partner Violence: Not At Risk (11/04/2023)   Epic    Fear of Current or Ex-Partner: No    Emotionally Abused: No    Physically Abused: No    Sexually Abused: No  Depression (PHQ2-9): Low Risk (11/04/2023)   Depression (PHQ2-9)    PHQ-2 Score: 0  Alcohol Screen: Not on file  Housing: Unknown (11/04/2023)   Epic    Unable to Pay for Housing in the Last Year: No    Number of Times Moved in the Last Year: Not on file    Homeless in the Last Year: No  Utilities: Not At Risk (11/04/2023)   Epic    Threatened with loss of utilities: No  Health Literacy: Not on file     Family History:   Family History  Problem Relation Age of Onset   Cancer Neg Hx      ROS:  All other ROS reviewed and  negative. Pertinent positives noted in the HPI.     Physical Exam/Data:   Vitals:   02/10/24 1910 02/10/24 1912 02/10/24 1915 02/10/24 1920  BP:  (!) 83/58 95/69   Pulse: (!) 106 (!) 103 (!) 51 92  Resp: 20 18 19 18   Temp: (!) 96.1 F (35.6 C) (!) 96.3 F (35.7 C) (!) 96.7 F (35.9 C) (!) 97.2 F (36.2 C)  SpO2: 100% 100% 100% 100%  Weight:      Height:       No intake or output data in the 24 hours ending 02/10/24 1947     02/10/2024    7:00 PM 12/09/2023   12:10 PM 09/22/2023   11:49 AM  Last 3 Weights  Weight (lbs) 235 lb 14.3 oz 251 lb 12.8 oz 251 lb 9.6 oz  Weight (kg) 107 kg 114.216 kg 114.125 kg    Body mass index is 30.29 kg/m.  Physical Exam Vitals and nursing note reviewed.  Constitutional:      Interventions: He is intubated.  Cardiovascular:     Rate and Rhythm: Normal rate and regular rhythm.  Pulmonary:     Effort: Pulmonary effort is normal. No respiratory distress. He is intubated.  Musculoskeletal:     Right lower leg: No edema.     Left lower leg: No edema.  Skin:    General: Skin is warm.  Neurological:     Comments: Obtunded      EKG:  The EKG was personally reviewed and demonstrates: Sinus tachycardia with PVCs and nonspecific ST changes Telemetry:  Telemetry was personally reviewed and demonstrates: Sinus  Relevant CV Studies: Echocardiogram/18/18:  - Left ventricle: The cavity size was normal. There was mild    concentric hypertrophy with moderate hypertrophy of the septum    and inferior wall. Systolic function was normal. The estimated    ejection fraction was in the range of 60% to 65%. Wall motion was    normal; there were no regional wall motion abnormalities. Doppler    parameters are consistent with abnormal left ventricular    relaxation (grade 1 diastolic dysfunction). Doppler parameters    are consistent with indeterminate ventricular filling pressure.  - Aortic valve: Transvalvular velocity was within the normal range.     There was no stenosis. There was trivial regurgitation.  - Mitral valve: Transvalvular velocity was within the normal range.    There was no evidence for stenosis. There was trivial    regurgitation.  - Right ventricle: The cavity size was normal. Wall  thickness was    normal. Systolic function was normal.  - Tricuspid valve: There was trivial regurgitation.  - Pulmonary arteries: Systolic pressure was within the normal    range. PA peak pressure: 25 mm Hg (S).  - Global longitudinal strain -19.9% (normal).   Assessment and Plan:  Cardiac arrest post CPR and AED shock for unknown shockable rhythm in the field, currently intubated -bedside POC US  in the ED with limited views due to body habitus and supine with estimated ejection fraction of 35 to 40%, global hypokinesis and paradoxic septal motion.  No obvious regional wall motion abnormalities on limited views, no pericardial effusion or intracardiac thrombus noted.  Does not appear to be in cardiogenic shock as extremities are warm.  Will follow-up on pending labs including troponin, LFTs, etc. Hypokalemia Lactic acidosis AKI     For questions or updates, please contact [facility] Please consult [email or web address] for contact info under    CRITICAL CARE Performed by: Emeline FORBES Calender  Total critical care time: 63 minutes. Critical care time was exclusive of separately billable procedures and treating other patients. Critical care was necessary to treat or prevent imminent or life-threatening deterioration. Critical care was time spent personally by me on the following activities: development of treatment plan with patient and/or surrogate as well as nursing, discussions with consultants, evaluation of patient's response to treatment, examination of patient, obtaining history from patient or surrogate, ordering and performing treatments and interventions, ordering and review of laboratory studies, ordering and review of radiographic  studies, pulse oximetry and re-evaluation of patient's condition.    Signed, Emeline Calender, DO [place]  [facility]  02/10/2024 7:47 PM     [1]  Allergies Allergen Reactions   Daratumumab  Shortness Of Breath, Diarrhea, Itching and Hypertension    Only with IV daratumumab    Heparin  Other (See Comments)    For religious purposes, does not agree to Heparin  or other pork products   "

## 2024-02-11 ENCOUNTER — Inpatient Hospital Stay (HOSPITAL_COMMUNITY)

## 2024-02-11 ENCOUNTER — Encounter (HOSPITAL_COMMUNITY): Payer: Self-pay | Admitting: Critical Care Medicine

## 2024-02-11 ENCOUNTER — Other Ambulatory Visit: Payer: Self-pay

## 2024-02-11 DIAGNOSIS — E876 Hypokalemia: Secondary | ICD-10-CM | POA: Diagnosis not present

## 2024-02-11 DIAGNOSIS — R253 Fasciculation: Secondary | ICD-10-CM

## 2024-02-11 DIAGNOSIS — Z205 Contact with and (suspected) exposure to viral hepatitis: Secondary | ICD-10-CM | POA: Diagnosis not present

## 2024-02-11 DIAGNOSIS — I110 Hypertensive heart disease with heart failure: Secondary | ICD-10-CM | POA: Diagnosis not present

## 2024-02-11 DIAGNOSIS — I5022 Chronic systolic (congestive) heart failure: Secondary | ICD-10-CM | POA: Diagnosis not present

## 2024-02-11 DIAGNOSIS — I469 Cardiac arrest, cause unspecified: Secondary | ICD-10-CM | POA: Diagnosis not present

## 2024-02-11 DIAGNOSIS — E872 Acidosis, unspecified: Secondary | ICD-10-CM | POA: Diagnosis not present

## 2024-02-11 DIAGNOSIS — J189 Pneumonia, unspecified organism: Secondary | ICD-10-CM

## 2024-02-11 DIAGNOSIS — G9340 Encephalopathy, unspecified: Secondary | ICD-10-CM | POA: Diagnosis not present

## 2024-02-11 DIAGNOSIS — R569 Unspecified convulsions: Secondary | ICD-10-CM | POA: Diagnosis not present

## 2024-02-11 DIAGNOSIS — Z9911 Dependence on respirator [ventilator] status: Secondary | ICD-10-CM

## 2024-02-11 DIAGNOSIS — R7989 Other specified abnormal findings of blood chemistry: Secondary | ICD-10-CM | POA: Diagnosis not present

## 2024-02-11 DIAGNOSIS — J9601 Acute respiratory failure with hypoxia: Secondary | ICD-10-CM | POA: Diagnosis not present

## 2024-02-11 DIAGNOSIS — C9000 Multiple myeloma not having achieved remission: Secondary | ICD-10-CM

## 2024-02-11 DIAGNOSIS — I5021 Acute systolic (congestive) heart failure: Secondary | ICD-10-CM

## 2024-02-11 LAB — ECHOCARDIOGRAM COMPLETE
Calc EF: 36.1 %
Height: 74 in
S' Lateral: 4.1 cm
Single Plane A2C EF: 36.4 %
Single Plane A4C EF: 35.5 %
Weight: 3911.84 [oz_av]

## 2024-02-11 LAB — COMPREHENSIVE METABOLIC PANEL WITH GFR
ALT: 94 U/L — ABNORMAL HIGH (ref 0–44)
AST: 82 U/L — ABNORMAL HIGH (ref 15–41)
Albumin: 4.2 g/dL (ref 3.5–5.0)
Alkaline Phosphatase: 68 U/L (ref 38–126)
Anion gap: 15 (ref 5–15)
BUN: 16 mg/dL (ref 6–20)
CO2: 19 mmol/L — ABNORMAL LOW (ref 22–32)
Calcium: 8.8 mg/dL — ABNORMAL LOW (ref 8.9–10.3)
Chloride: 110 mmol/L (ref 98–111)
Creatinine, Ser: 1.39 mg/dL — ABNORMAL HIGH (ref 0.61–1.24)
GFR, Estimated: >60 mL/min
Glucose, Bld: 106 mg/dL — ABNORMAL HIGH (ref 70–99)
Potassium: 3.4 mmol/L — ABNORMAL LOW (ref 3.5–5.1)
Sodium: 143 mmol/L (ref 135–145)
Total Bilirubin: 0.4 mg/dL (ref 0.0–1.2)
Total Protein: 6 g/dL — ABNORMAL LOW (ref 6.5–8.1)

## 2024-02-11 LAB — CBC
HCT: 33.3 % — ABNORMAL LOW (ref 39.0–52.0)
Hemoglobin: 11.6 g/dL — ABNORMAL LOW (ref 13.0–17.0)
MCH: 33 pg (ref 26.0–34.0)
MCHC: 34.8 g/dL (ref 30.0–36.0)
MCV: 94.6 fL (ref 80.0–100.0)
Platelets: 158 K/uL (ref 150–400)
RBC: 3.52 MIL/uL — ABNORMAL LOW (ref 4.22–5.81)
RDW: 13.2 % (ref 11.5–15.5)
WBC: 6.3 K/uL (ref 4.0–10.5)
nRBC: 0 % (ref 0.0–0.2)

## 2024-02-11 LAB — BLOOD CULTURE ID PANEL (REFLEXED) - BCID2

## 2024-02-11 LAB — HEPATIC FUNCTION PANEL
ALT: 81 U/L — ABNORMAL HIGH (ref 0–44)
AST: 50 U/L — ABNORMAL HIGH (ref 15–41)
Albumin: 4 g/dL (ref 3.5–5.0)
Alkaline Phosphatase: 65 U/L (ref 38–126)
Bilirubin, Direct: 0.2 mg/dL (ref 0.0–0.2)
Indirect Bilirubin: 0.3 mg/dL (ref 0.3–0.9)
Total Bilirubin: 0.4 mg/dL (ref 0.0–1.2)
Total Protein: 6 g/dL — ABNORMAL LOW (ref 6.5–8.1)

## 2024-02-11 LAB — ETHANOL: Alcohol, Ethyl (B): <15 mg/dL

## 2024-02-11 LAB — BASIC METABOLIC PANEL WITH GFR
Anion gap: 13 (ref 5–15)
Anion gap: 11 (ref 5–15)
BUN: 19 mg/dL (ref 6–20)
BUN: 20 mg/dL (ref 6–20)
CO2: 23 mmol/L (ref 22–32)
CO2: 23 mmol/L (ref 22–32)
Calcium: 8.8 mg/dL — ABNORMAL LOW (ref 8.9–10.3)
Calcium: 8.5 mg/dL — ABNORMAL LOW (ref 8.9–10.3)
Chloride: 111 mmol/L (ref 98–111)
Chloride: 113 mmol/L — ABNORMAL HIGH (ref 98–111)
Creatinine, Ser: 1.05 mg/dL (ref 0.61–1.24)
Creatinine, Ser: 1.05 mg/dL (ref 0.61–1.24)
GFR, Estimated: >60 mL/min
GFR, Estimated: >60 mL/min
Glucose, Bld: 96 mg/dL (ref 70–99)
Glucose, Bld: 88 mg/dL (ref 70–99)
Potassium: 3.7 mmol/L (ref 3.5–5.1)
Potassium: 3.9 mmol/L (ref 3.5–5.1)
Sodium: 146 mmol/L — ABNORMAL HIGH (ref 135–145)
Sodium: 146 mmol/L — ABNORMAL HIGH (ref 135–145)

## 2024-02-11 LAB — GLUCOSE, CAPILLARY
Glucose-Capillary: 93 mg/dL (ref 70–99)
Glucose-Capillary: 99 mg/dL (ref 70–99)
Glucose-Capillary: 77 mg/dL (ref 70–99)
Glucose-Capillary: 91 mg/dL (ref 70–99)
Glucose-Capillary: 69 mg/dL — ABNORMAL LOW (ref 70–99)
Glucose-Capillary: 138 mg/dL — ABNORMAL HIGH (ref 70–99)
Glucose-Capillary: 92 mg/dL (ref 70–99)

## 2024-02-11 LAB — MAGNESIUM
Magnesium: 2.3 mg/dL (ref 1.7–2.4)
Magnesium: 2.1 mg/dL (ref 1.7–2.4)
Magnesium: 2 mg/dL (ref 1.7–2.4)

## 2024-02-11 LAB — PHOSPHORUS
Phosphorus: 3.1 mg/dL (ref 2.5–4.6)
Phosphorus: 3.4 mg/dL (ref 2.5–4.6)
Phosphorus: 2.7 mg/dL (ref 2.5–4.6)

## 2024-02-11 LAB — HIV ANTIBODY (ROUTINE TESTING W REFLEX): HIV Screen 4th Generation wRfx: NONREACTIVE

## 2024-02-11 LAB — TRIGLYCERIDES: Triglycerides: 125 mg/dL

## 2024-02-11 LAB — MRSA NEXT GEN BY PCR, NASAL: MRSA by PCR Next Gen: NOT DETECTED

## 2024-02-11 MED ORDER — DEXTROSE 50 % IV SOLN
12.5000 g | Freq: Once | INTRAVENOUS | Status: AC
  Administered 2024-02-11: 12.5 g via INTRAVENOUS
  Filled 2024-02-11: qty 50

## 2024-02-11 MED ORDER — POTASSIUM CHLORIDE 20 MEQ PO PACK
40.0000 meq | PACK | Freq: Once | ORAL | Status: AC
  Administered 2024-02-11: 40 meq
  Filled 2024-02-11: qty 2

## 2024-02-11 MED ORDER — ACYCLOVIR 200 MG/5ML PO SUSP
800.0000 mg | Freq: Two times a day (BID) | ORAL | Status: DC
  Administered 2024-02-11 – 2024-02-14 (×7): 800 mg
  Filled 2024-02-11 (×8): qty 20

## 2024-02-11 MED ORDER — VITAL HP 1.0 CAL PO LIQD: 1000.0000 mL | ORAL | Status: DC

## 2024-02-11 MED ORDER — LORAZEPAM 2 MG/ML IJ SOLN
INTRAMUSCULAR | Status: AC
  Administered 2024-02-11: 2 mg via INTRAVENOUS
  Filled 2024-02-11: qty 1

## 2024-02-11 MED ORDER — PIVOT 1.5 CAL PO LIQD: 1000.0000 mL | ORAL | Status: DC

## 2024-02-11 MED ORDER — LEVETIRACETAM (KEPPRA) 500 MG/5 ML ADULT IV PUSH
500.0000 mg | Freq: Two times a day (BID) | INTRAVENOUS | Status: DC
  Administered 2024-02-11 – 2024-02-15 (×9): 500 mg via INTRAVENOUS
  Filled 2024-02-11 (×9): qty 5

## 2024-02-11 MED ORDER — ORAL CARE MOUTH RINSE
15.0000 mL | OROMUCOSAL | Status: DC
  Administered 2024-02-11 – 2024-02-13 (×29): 15 mL via OROMUCOSAL

## 2024-02-11 MED ORDER — BUSPIRONE HCL 10 MG PO TABS
30.0000 mg | ORAL_TABLET | Freq: Three times a day (TID) | ORAL | Status: DC | PRN
  Administered 2024-02-11 – 2024-02-13 (×2): 30 mg
  Filled 2024-02-11 (×2): qty 3

## 2024-02-11 MED ORDER — SULFAMETHOXAZOLE-TRIMETHOPRIM 200-40 MG/5ML PO SUSP
20.0000 mL | Freq: Every day | ORAL | Status: DC
  Administered 2024-02-11: 20 mL
  Filled 2024-02-11: qty 20

## 2024-02-11 MED ORDER — LORAZEPAM 2 MG/ML IJ SOLN: 2.0000 mg | Freq: Once | INTRAMUSCULAR | Status: AC

## 2024-02-11 MED ORDER — SENNA 8.6 MG PO TABS: 1.0000 | ORAL_TABLET | Freq: Every day | ORAL | Status: DC | PRN

## 2024-02-11 MED ORDER — PIPERACILLIN-TAZOBACTAM 3.375 G IVPB
3.3750 g | Freq: Three times a day (TID) | INTRAVENOUS | Status: AC
  Administered 2024-02-11 – 2024-02-18 (×20): 3.375 g via INTRAVENOUS
  Filled 2024-02-11 (×21): qty 50

## 2024-02-11 MED ORDER — ORAL CARE MOUTH RINSE: 15.0000 mL | OROMUCOSAL | Status: DC | PRN

## 2024-02-11 MED ORDER — LACTATED RINGERS IV BOLUS
1000.0000 mL | Freq: Once | INTRAVENOUS | Status: AC
  Administered 2024-02-11: 1000 mL via INTRAVENOUS

## 2024-02-11 MED ORDER — ENTECAVIR 0.5 MG PO TABS
0.5000 mg | ORAL_TABLET | ORAL | Status: DC
  Administered 2024-02-11: 0.5 mg
  Filled 2024-02-11 (×3): qty 1

## 2024-02-11 MED ORDER — VITAL HP 1.0 CAL PO LIQD
1000.0000 mL | ORAL | Status: DC
  Administered 2024-02-11: 1000 mL

## 2024-02-11 MED ORDER — SULFAMETHOXAZOLE-TRIMETHOPRIM 200-40 MG/5ML PO SUSP
20.0000 mL | ORAL | Status: DC
  Administered 2024-02-13: 20 mL
  Filled 2024-02-11: qty 20

## 2024-02-11 MED ORDER — VITAL 1.5 CAL PO LIQD
1000.0000 mL | ORAL | Status: DC
  Administered 2024-02-11: 1000 mL

## 2024-02-11 MED ORDER — POLYETHYLENE GLYCOL 3350 17 G PO PACK: 17.0000 g | PACK | Freq: Every day | ORAL | Status: DC | PRN

## 2024-02-11 NOTE — Progress Notes (Signed)
" °  Echocardiogram 2D Echocardiogram has been performed.  Bradley Hunt 02/11/2024, 4:38 PM "

## 2024-02-11 NOTE — Progress Notes (Addendum)
 Initial Nutrition Assessment  DOCUMENTATION CODES:   Not applicable  INTERVENTION:   Tube Feeding via OG: Trickle TF only today Vital 1.5 at 20 ml/hr TF Goal Regimen: Vital 1.5 at 60 ml/hr with Pro-Source TF20 60 mL BID Goal Regimen provides 137 g of protein, 2320 kcals, 1094 mL of free water  Pt currently receiving additional lipid calories via Propofol   No Pork Products (religious/pt preference)  NUTRITION DIAGNOSIS:   Inadequate oral intake related to acute illness as evidenced by NPO status.  GOAL:   Patient will meet greater than or equal to 90% of their needs  MONITOR:   TF tolerance, Vent status, Labs, Weight trends  REASON FOR ASSESSMENT:   Consult, Ventilator Enteral/tube feeding initiation and management  ASSESSMENT:   54 yo male admitted post cardiac arrest, acute respiratory failure-COVID+. Pt with significant diarrhea recently: +Norovirus/Rotavirus/Enteroaggregative E.Coli PCR on 1/06 at [facility]. PTA. PMH includes multiple myeloma with spinal mets, s/p stem cell transplant, chemo and radiation therapy  1/13 Admitted, Cardiac Arrest, COVID +  Pt currently on vent support, on fentanyl  and propofol  gtt  Last BM PTA, abdomen obese but soft, BS present Pt on enteric precautions, +diarrhea PTA. Noted bowel regimen prn + Norovirus, Rotavirus and Enteroaggregative E.Coli PCR at [facility] on 02/03/23  OG tube in stomach per abd xray  Labs: Sodium 143 (wdl) Potassium 3.4 (wdl) BUN wdl Creatinine 1.39  Phosphorus 3.1 (wdl) Magnesium  2.3 (wdl)  Meds: reviewed  NUTRITION - FOCUSED PHYSICAL EXAM:  Assess on follow-up  Diet Order:   Diet Order     None       EDUCATION NEEDS:   Not appropriate for education at this time  Skin:  Skin Assessment: Reviewed RN Assessment  Last BM:  PTA  Height:   Ht Readings from Last 1 Encounters:  02/10/24 6' 2 (1.88 m)    Weight:   Wt Readings from Last 1 Encounters:  02/11/24 110.9 kg    BMI:  Body  mass index is 31.39 kg/m.  Estimated Nutritional Needs:   Kcal:  2200-2400 kcals  Protein:  130-150 g  Fluid:  >/= 2L   Betsey Finger MS, RDN, LDN, CNSC Registered Dietitian 3 Clinical Nutrition RD Inpatient Contact Info in Amion

## 2024-02-11 NOTE — Progress Notes (Addendum)
 PHARMACY - PHYSICIAN COMMUNICATION CRITICAL VALUE ALERT - BLOOD CULTURE IDENTIFICATION (BCID)  Fahad Cisse is an 54 y.o. male who presented to [facility] on 02/10/2024 with a chief complaint of cardiac arrest  Assessment:  bcx 1/4 vials GPC [anaerobic vial] mecA positive [MRSE]  Name of physician (or Provider) Contacted: Leita Gleason  Current antibiotics: Zosyn  EI, Bactrim   Changes to prescribed antibiotics recommended:  Likely contaminant. No changes at this time  No results found for this or any previous visit.  Benedetta Heath BS, PharmD, BCPS Clinical Pharmacist 02/11/2024 7:45 PM  Contact: [phone] after 3 PM

## 2024-02-11 NOTE — Procedures (Signed)
 VENTILATOR WEAN NOTE 02/11/2024  Start Mode: PRVC  Wean Mode: Pressure Support  Duration before failure: No wean  Reason for failure: No wean   Notes: No wean today due to increased sedation.

## 2024-02-11 NOTE — Progress Notes (Signed)
 NEUROLOGY CONSULT FOLLOW UP NOTE   Date of service: February 11, 2024 Patient Name: Bradley Hunt MRN:  [id number] DOB:  [birth year]  Interval Hx/subjective   Wife is at the bedside and has been updated. Patient remains intubated on 50 mcg of propofol  and 150mcg fentanyl . Overnight events reviewed. He is also on rapid EEG with continuous slowing, no seizures. RN at the bedside.  Waiting on LTM to be connected  Vitals   Vitals:   02/11/24 0815 02/11/24 0830 02/11/24 0845 02/11/24 0900  BP: 121/86 124/85 128/89 126/87  Pulse: 82 82 84 84  Resp: 18 18 18  (!) 21  Temp: 97.7 F (36.5 C) 97.9 F (36.6 C) 98.2 F (36.8 C) 98.6 F (37 C)  TempSrc:      SpO2: 100% 100% 100% 100%  Weight:      Height:         Body mass index is 31.39 kg/m.  Physical Exam   Constitutional: critically ill intubated on sedation  Psych: sedated  Eyes: No scleral injection.   HENT: No OP obstrucion.  ET tube and OGT  Head: Normocephalic.   Cardiovascular: Normal rate and regular rhythm.   Respiratory: on ventilator  GI: Soft.  No distension. There is no tenderness.   Skin: WDI.    Neurologic Examination   Mental Status -  Sedated. Eyes closed, does not open eyes to loud stimulation. Does not follow commands, no spontaneous movement  Cranial Nerves II - XII - Visual field no blink to threat. PERRL, disconjugate upward gaze, corneal reflex intact bilaterally, face symmetry hard to assess due to ETT, + cough, no gag, all 4 extremities flaccid to noxious stimuli    Medications Current Medications[1]  Labs and Diagnostic Imaging   CBC:  Recent Labs  Lab 02/10/24 2314 02/11/24 0937  WBC 4.0 6.3  HGB 10.9* 11.6*  HCT 32.3* 33.3*  MCV 94.4 94.6  PLT 137* 158    Basic Metabolic Panel:  Lab Results  Component Value Date   NA 143 02/10/2024   K 3.4 (L) 02/10/2024   CO2 19 (L) 02/10/2024   GLUCOSE 106 (H) 02/10/2024   BUN 16 02/10/2024   CREATININE 1.39 (H) 02/10/2024   CALCIUM  8.8  (L) 02/10/2024   GFRNONAA >60 02/10/2024   GFRAA >60 10/18/2019   Lipid Panel: No results found for: LDLCALC HgbA1c: No results found for: HGBA1C Urine Drug Screen: No results found for: LABOPIA, COCAINSCRNUR, LABBENZ, AMPHETMU, THCU, LABBARB  Alcohol Level     Component Value Date/Time   ETH <15 02/10/2024 2314   INR  Lab Results  Component Value Date   INR 1.12 07/23/2017   APTT  Lab Results  Component Value Date   APTT 27 07/08/2016   AED levels: No results found for: PHENYTOIN, ZONISAMIDE, LAMOTRIGINE, LEVETIRACETA  CT Head without contrast(Personally reviewed): No acute process    Ceribell EEG 1/14:    - Continuous slow, generalized   IMPRESSION: This limited ceribell EEG is suggestive of generalized nonspecific cerebral dysfunction (encephalopathy). No seizures or epileptiform discharges were seen throughout the recording.   Assessment   Bradley Hunt is a 54 y.o. male   with abnormal movements following cardiac arrest.  These movements were captured on rapid limited montage EEG and did not appear to have any ictal correlate. Overnight had some shivering/twitching and was connected to Rapid EEG with no seizure activity noted.   Recommendations   - Seizure precautions - LTM today - Continue Keppra  500 mg BID  -  Mri brain w/wo in 3 -5 days post arrest  - Neurology will continue to follow  ______________________________________________________________________   Signed, Karna DELENA Geralds, NP [facility]   NEUROHOSPITALIST ADDENDUM Performed a face to face diagnostic evaluation.   I have reviewed the contents of history and physical exam as documented by PA/ARNP/Resident and agree with above documentation.  I have discussed and formulated the above plan as documented. Edits to the note have been made as needed.  Impression/Key exam findings/Plan: sedation confounds neuro exam. Plan is to put him up on LTM and gradually wean  off propofol  and re-examine. No clinical seizures seen, ceribell limited EEG negative so far.  MRI brain between day 3-5.  We will continue to follow along.  Discussed with RN Ruther and with patient's wife at the bedside.  Leelyn Jasinski, MD [facility] [phone]   If 7pm to 7am, please call on call as listed on AMION.      [1]  Current Facility-Administered Medications:    0.9 %  sodium chloride  infusion, 250 mL, Intravenous, Continuous, Rosan Deward ORN, NP, Last Rate: 10 mL/hr at 02/11/24 0800, Infusion Verify at 02/11/24 0800   [START ON 02/12/2024] acetaminophen  (TYLENOL ) tablet 650 mg, 650 mg, Oral, Q4H PRN **OR** [START ON 02/12/2024] acetaminophen  (TYLENOL ) 160 MG/5ML solution 650 mg, 650 mg, Per Tube, Q4H PRN **OR** [START ON 02/12/2024] acetaminophen  (TYLENOL ) suppository 650 mg, 650 mg, Rectal, Q4H PRN, Rosan Deward ORN, NP   acyclovir  (ZOVIRAX ) 200 MG/5ML suspension SUSP 800 mg, 800 mg, Per Tube, BID, Bowser, Grace E, NP   busPIRone  (BUSPAR ) tablet 30 mg, 30 mg, Per Tube, Q8H PRN, Bowser, Ronnald BRAVO, NP   Chlorhexidine  Gluconate Cloth 2 % PADS 6 each, 6 each, Topical, Daily, Rosan Deward ORN, NP, 6 each at 02/10/24 2215   entecavir  (BARACLUDE ) tablet 0.5 mg, 0.5 mg, Per Tube, Q24H, Bowser, Grace E, NP   famotidine  (PEPCID ) tablet 20 mg, 20 mg, Per Tube, BID, Hoffman, Paul W, NP, 20 mg at 02/10/24 2322   fentaNYL  (SUBLIMAZE ) bolus via infusion 25-100 mcg, 25-100 mcg, Intravenous, Q15 min PRN, Hoffman, Paul W, NP, 50 mcg at 02/11/24 0315   fentaNYL  in NS (30mcg/ml) infusion-PREMIX, 0-400 mcg/hr, Intravenous, Continuous, Rosan Deward ORN, NP, Last Rate: 15 mL/hr at 02/11/24 0800, 150 mcg/hr at 02/11/24 0800   fondaparinux  (ARIXTRA ) injection 2.5 mg, 2.5 mg, Subcutaneous, Q24H, Rosan Deward ORN, NP   levETIRAcetam  (KEPPRA ) undiluted injection 500 mg, 500 mg, Intravenous, Q12H, Wilson, Tara N, PA-C, 500 mg at 02/11/24 0406   norepinephrine  (LEVOPHED ) 4mg  in  (0.016 mg/mL) premix infusion, 0-10 mcg/min, Intravenous, Titrated, Rosan Deward ORN, NP   ondansetron  (ZOFRAN ) injection 4 mg, 4 mg, Intravenous, Q6H PRN, Rosan Deward ORN, NP   Oral care mouth rinse, 15 mL, Mouth Rinse, Q2H, Rosan Deward ORN, NP, 15 mL at 02/11/24 0801   Oral care mouth rinse, 15 mL, Mouth Rinse, PRN, Rosan Deward ORN, NP   piperacillin -tazobactam (ZOSYN ) IVPB 3.375 g, 3.375 g, Intravenous, Q8H, Salam, Savannah B, RPH   polyethylene glycol (MIRALAX  / GLYCOLAX ) packet 17 g, 17 g, Per Tube, Daily PRN, Bowser, Ronnald BRAVO, NP   propofol  (DIPRIVAN ) 1000 MG/100ML infusion, 0-80 mcg/kg/min, Intravenous, Titrated, Tegeler, Lonni PARAS, MD, Last Rate: 32.1 mL/hr at 02/11/24 0716, 50 mcg/kg/min at 02/11/24 0716   senna (SENOKOT) tablet 8.6 mg, 1 tablet, Per Tube, Daily PRN, Bowser, Ronnald BRAVO, NP   sulfamethoxazole -trimethoprim  (BACTRIM ) 200-40 MG/5ML suspension 20 mL, 20 mL, Per Tube, Daily, Bowser, Ronnald BRAVO,  NP

## 2024-02-11 NOTE — Plan of Care (Signed)
" °  Problem: Neurologic: Goal: Promote progressive neurologic recovery Outcome: Progressing   Problem: Skin Integrity: Goal: Risk for impaired skin integrity will be minimized. Outcome: Progressing   Problem: Cardiac: Goal: Ability to achieve and maintain adequate cardiopulmonary perfusion will improve Outcome: Progressing   Problem: Education: Goal: Ability to manage disease process will improve Outcome: Progressing   Problem: Clinical Measurements: Goal: Ability to maintain clinical measurements within normal limits will improve Outcome: Progressing Goal: Will remain free from infection Outcome: Progressing Goal: Diagnostic test results will improve Outcome: Progressing Goal: Respiratory complications will improve Outcome: Progressing Goal: Cardiovascular complication will be avoided Outcome: Progressing   "

## 2024-02-11 NOTE — Progress Notes (Addendum)
 0315 shivering vs seizure like activity. Increased HR, eyes fixed upward. Severe jerking/ shaking. Jaw locked down. PA called to bedside. See orders, MAR, and interventions documented in flowsheets.

## 2024-02-11 NOTE — Progress Notes (Signed)
 LTM EEG hooked up and running - no initial skin breakdown - push button tested - [facility] monitoring.

## 2024-02-11 NOTE — Progress Notes (Signed)
 "  NAMECreek Hunt, MRN:  [id number], DOB:  1970-02-01, LOS: 1 ADMISSION DATE:  02/10/2024, CONSULTATION DATE:  1/13 REFERRING MD:  Dr. 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  S/p autologous stem cells 2018. HFrEF previously on Entresto, which was stopped during a previous admission for hypotension. Family reports he had been doing well. No complaints at all. In his usual state of health when a friend heard him collapse in the bathroom and called EMS. The patient was unresponsive. Upon EMS arrival the patient was pulseless and CPR was initiated. Shockable rhythm identified and he was shocked x 1. CPR duration a total of 10 mins. LMA was placed in the field and was removed when the patient began to gag. Remained unresponsive in the ED and was intubated for airway protection. Some concern for seizure vs posturing in the ED as well. Neurology and cardiology consulted. Ceribell EEG in ED negative for seizure. CTA chest and head ordered. Low dose norepi required. PCCM asked to admit.   Pertinent  Medical History   has a past medical history of Bone metastases, History of chemotherapy, History of radiation therapy, Multiple myeloma not having achieved remission (HCC) (06/25/2015), and Numbness.   Significant Hospital Events: Including procedures, antibiotic start and stop dates in addition to other pertinent events   1/13 admit following cardiac arrest  Interim History / Subjective:  AM labs are pending   Outpatient oncology visits reviewed -- specifically 10/2023 - [facility]  10/2023 cardiology note reviewed Objective    Blood pressure 126/87, pulse 84, temperature 98.6 F (37 C), resp. rate (!) 21, height 6' 2 (1.88 m), weight 110.9 kg, SpO2 100%.    Vent Mode: PRVC FiO2 (%):  [50 %-100 %] 50 % Set  Rate:  [18 bmp] 18 bmp Vt Set:  [660 mL] 660 mL PEEP:  [5 cmH20] 5 cmH20 Plateau Pressure:  [19 cmH20-22 cmH20] 20 cmH20   Intake/Output Summary (Last 24 hours) at 02/11/2024 1036 Last data filed at 02/11/2024 0800 Gross per 24 hour  Intake 1056.65 ml  Output 1300 ml  Net -243.35 ml   Filed Weights   02/10/24 1900 02/10/24 2217 02/11/24 0716  Weight: 107 kg 102.5 kg 110.9 kg    Examination: General: critically ill obese M intubated sedated  Neuro: sedated. Pinpoint pupils. No response to pain  HENT: ETT secure anicteric sclera pink mm  Lungs: Mechanically ventilated, clear  Cardiovascular: rr s1s2  Abdomen: exam obscured by cooling pads; soft and round  Extremities: no acute joint deformity   Resolved problem list  Shock  Malpositioned ETT  AKI  Assessment and Plan   Acute encephalopathy -c/f anoxic encephalopathy  Seizure like movement -no sz captured on eeg, more c/w shivering or posturing P -neurology has been consulted, appreciate recs -cEEG is ordered -will add buspar , counter shivering measures   -in setting of possible post arrest sz activity, he is on keppra  500 BID  -re his home fill hx of keppra  -- this was related to CAR-T tx, he has no sz hx   Cardiac arrest - resusc to ROSC but several minutes without bystander CPR preceding  -shockable, query hypoK hypoMag etiology 2/2 GI illnesses  P -avoid fever -- normothermia  -ECHO  -follow lytes  Acute respiratory failure w hypoxia Bilat PNA - in immunocomp host  -COVID-19 infection  v PNA -Possible CAP  -Possible aspiration PNA  P -airborne  -cont MV support -VAP, pulm hygiene -PRN CXR ABG  -follow trach aspirate  -has been on rocephin  -- looking through his OSH records re opportunistic infections and will escalate coverage to zosyn  and further adjust depending on what we find. MRSA PCR was neg so will defer  -will review lit re covid therapies and MM pts, but based on his imaging (some opacities  on CT but not that bad) unless there is great data, I would probably defer.    Hypokalemia Hypomagnesemia -?2/2 GI losses  P -replaced last night -awaiting AM labs still 1/14 -- follow and replace PRN   Norovirus Rotavirus  Enteroaggregative e coli PCR +  -02/03/2024 [facility] -had 3d azithro. Had traveled to [place] a few weeks before diarrhea started  P -enteric  -correct lytes asx above -IVF as needed  -will change default bowel reg to PRN   Multiple myeloma s/p chemo & stem cell txp, most recently CAR-T (11/11/2023) Hx induced pancytopenia, improving  -start his home bactrim , acyclovir  -his tx plan is monthly IVIG w Dr Clayborne  -re Keppra  fill hx-- he was on Keppra  x30d after CAR-T, and appropriately stopped this outpt   Elevated LFTs, mild   Hep B exposure -restart his home baraclude   -PRN LFTs   Chronic HFrEF HTN  P -holding home meds for now -ECHO pending -cards has seen   Lactic acidosis, improving P -follow PRN   Fondaparinux  for VTE prophylaxis. No pork products   Wife updated at bedside Case d/w cards    Labs   CBC: Recent Labs  Lab 02/10/24 1809 02/10/24 1922 02/10/24 2009 02/10/24 2314 02/11/24 0937  WBC 8.8  --   --  4.0 6.3  HGB 11.1* 11.6* 11.6* 10.9* 11.6*  HCT 33.7* 34.0* 34.0* 32.3* 33.3*  MCV 98.3  --   --  94.4 94.6  PLT 177  --   --  137* 158    Basic Metabolic Panel: Recent Labs  Lab 02/10/24 1809 02/10/24 1922 02/10/24 2009 02/10/24 2314 02/11/24 0937  NA 143 144 143 143 146*  K 2.8* 2.8* 2.6* 3.4* 3.7  CL 110 108  --  110 111  CO2 17*  --   --  19* 23  GLUCOSE 148* 153*  --  106* 96  BUN 11 11  --  16 19  CREATININE 1.22 1.30*  --  1.39* 1.05  CALCIUM  8.0*  --   --  8.8* 8.8*  MG 1.5*  --   --  2.3 2.1  PHOS  --   --   --  3.1 3.4   GFR: Estimated Creatinine Clearance: 106.6 mL/min (by C-G formula based on SCr of 1.05 mg/dL). Recent Labs  Lab 02/10/24 1809 02/10/24 1921 02/10/24 2110 02/10/24 2314  02/11/24 0937  WBC 8.8  --   --  4.0 6.3  LATICACIDVEN  --  4.8* 2.4*  --   --     Liver Function Tests: Recent Labs  Lab 02/10/24 1809 02/10/24 2314 02/11/24 0937  AST 69* 82* 50*  ALT 78* 94* 81*  ALKPHOS 64 68 65  BILITOT 0.4 0.4 0.4  PROT 5.6* 6.0* 6.0*  ALBUMIN 3.8 4.2 4.0   Recent Labs  Lab 02/10/24 1809  LIPASE 86*   No results for input(s): AMMONIA in the last 168 hours.  ABG    Component Value Date/Time   PHART 7.333 (L) 02/10/2024 2009   PCO2ART 38.5 02/10/2024  2009   PO2ART 256 (H) 02/10/2024 2009   HCO3 20.5 02/10/2024 2009   TCO2 22 02/10/2024 2009   ACIDBASEDEF 5.0 (H) 02/10/2024 2009   O2SAT 100 02/10/2024 2009     Coagulation Profile: No results for input(s): INR, PROTIME in the last 168 hours.  Cardiac Enzymes: No results for input(s): CKTOTAL, CKMB, CKMBINDEX, TROPONINI in the last 168 hours.  HbA1C: No results found for: HGBA1C  CBG: Recent Labs  Lab 02/10/24 2336 02/11/24 0421 02/11/24 0753  GLUCAP 98 93 99   CRITICAL CARE Performed by: Ronnald FORBES Gave   Total critical care time: 80 minutes  Critical care time was exclusive of separately billable procedures and treating other patients. Critical care was necessary to treat or prevent imminent or life-threatening deterioration.  Critical care was time spent personally by me on the following activities: development of treatment plan with patient and/or surrogate as well as nursing, discussions with consultants, evaluation of patient's response to treatment, examination of patient, obtaining history from patient or surrogate, ordering and performing treatments and interventions, ordering and review of laboratory studies, ordering and review of radiographic studies, pulse oximetry and re-evaluation of patient's condition.  Ronnald Gave MSN, AGACNP-BC [facility] Amion for pager  02/11/2024, 10:36 AM        "

## 2024-02-11 NOTE — Procedures (Addendum)
 Patient Name: Bradley Hunt  MRN: [id number]  Epilepsy Attending: Arlin MALVA Krebs  Referring Physician/Provider: Dr Aisha Seals Duration: 02/11/2024 0413 to [phone]  Patient history: 54 year old male status post cardiac arrest. Rapid EEG to evaluate for seizure  Level of alertness:  comatose  AEDs during EEG study: LEV, Ativan , Propofol   Technical aspects: This EEG was obtained using a 10 lead EEG system positioned circumferentially without any parasagittal coverage (rapid EEG). Computer selected EEG is reviewed as  well as background features and all clinically significant events.  Description: EEG showed continuous generalized 6-10hz  theta-alpha activity admixed with intermittent 2-3hz  delta slowing. Hyperventilation and photic stimulation were not performed.     ABNORMALITY - Continuous slow, generalized  IMPRESSION: This limited ceribell EEG is suggestive of generalized nonspecific cerebral dysfunction (encephalopathy). No seizures or epileptiform discharges were seen throughout the recording.  If suspicion for ictal-interictal activity remains a concern, pleas consider full montage video eeg.   Yuna Pizzolato O Md Smola

## 2024-02-11 NOTE — Progress Notes (Addendum)
 "    Advanced Heart Failure Rounding Note  Cardiologist: None  [facility] Cardiologist: Dr. Cherrie Chief Complaint: Cardiac Arrest Patient Profile   Bradley Hunt is a 54 y.o. 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  Significant events:   1/13: OOH cardiac arrest w/o bystander CPR. Shockable rhythm by EMS, intubated. ?Seizure  Subjective:    POCUS echo with stable EF 35-40%. + noroviris, covid, rhinovirus. Intubated/sedated on vent at FiO2 50%.   Objective:    Weight Range: 110.9 kg Body mass index is 31.39 kg/m.   Vital Signs:   Temp:  [96.1 F (35.6 C)-100 F (37.8 C)] 97.3 F (36.3 C) (01/14 0800) Pulse Rate:  [36-130] 83 (01/14 0808) Resp:  [7-34] 18 (01/14 0808) BP: (73-159)/(54-112) 121/86 (01/14 0808) SpO2:  [87 %-100 %] 100 % (01/14 0800) FiO2 (%):  [50 %-100 %] 50 % (01/14 0808) Weight:  [102.5 kg-110.9 kg] 110.9 kg (01/14 0716) Last BM Date :  (PTA)  Weight change: Filed Weights   02/10/24 1900 02/10/24 2217 02/11/24 0716  Weight: 107 kg 102.5 kg 110.9 kg   Intake/Output:  Intake/Output Summary (Last 24 hours) at 02/11/2024 0846 Last data filed at 02/11/2024 0800 Gross per 24 hour  Intake 1056.65 ml  Output 1300 ml  Net -243.35 ml    Physical Exam   General:  Critically-ill appearing.   Cor: Regular rate & rhythm. No murmurs.  Lungs: clear Extremities: no edema   Telemetry   SR 80s (personally reviewed)  Labs   CBC Recent Labs    02/10/24 1809 02/10/24 1922 02/10/24 2009 02/10/24 2314  WBC 8.8  --   --  4.0  HGB 11.1*   < > 11.6* 10.9*  HCT 33.7*   < > 34.0* 32.3*  MCV 98.3  --   --  94.4  PLT 177  --   --  137*   < > = values in this interval not displayed.   Basic  Metabolic Panel Recent Labs    98/86/73 1809 02/10/24 1922 02/10/24 2009 02/10/24 2314  NA 143 144 143 143  K 2.8* 2.8* 2.6* 3.4*  CL 110 108  --  110  CO2 17*  --   --  19*  GLUCOSE 148* 153*  --  106*  BUN 11 11  --  16  CREATININE 1.22 1.30*  --  1.39*  CALCIUM  8.0*  --   --  8.8*  MG 1.5*  --   --  2.3  PHOS  --   --   --  3.1   Liver Function Tests Recent Labs    02/10/24 1809 02/10/24 2314  AST 69* 82*  ALT 78* 94*  ALKPHOS 64 68  BILITOT 0.4 0.4  PROT 5.6* 6.0*  ALBUMIN 3.8 4.2   Recent Labs    02/10/24 1809  LIPASE 86*   ProBNP (last 3 results) Recent Labs    02/10/24 1809  PROBNP 83.3   Medications:    Scheduled Medications:  Chlorhexidine  Gluconate Cloth  6 each Topical Daily   famotidine   20 mg Per Tube BID   fentaNYL  (SUBLIMAZE ) injection  25-50 mcg Intravenous Once   fondaparinux  (ARIXTRA ) injection  2.5 mg Subcutaneous Q24H   levETIRAcetam   500 mg Intravenous Q12H   mouth rinse  15  mL Mouth Rinse Q2H   polyethylene glycol  17 g Per Tube Daily   senna  1 tablet Per Tube BID    Infusions:  sodium chloride  10 mL/hr at 02/11/24 0800   cefTRIAXone  (ROCEPHIN )  IV Stopped (02/10/24 2355)   fentaNYL  infusion INTRAVENOUS 150 mcg/hr (02/11/24 0800)   norepinephrine  (LEVOPHED ) Adult infusion     propofol  (DIPRIVAN ) infusion 50 mcg/kg/min (02/11/24 0716)    PRN Medications: [START ON 02/12/2024] acetaminophen  **OR** [START ON 02/12/2024] acetaminophen  (TYLENOL ) oral liquid 160 mg/5 mL **OR** [START ON 02/12/2024] acetaminophen , fentaNYL , ondansetron  (ZOFRAN ) IV, mouth rinse  Assessment/Plan   Cardiac Arrest, Acute on chronic HFrEF: Found unresponsive by friend, no bystander CPR. Shockable rhythm on EMS arrival, shock x1. Known EF 35-40%, stable on POCUS today by Dr. Cherrie. In the setting of severe electrolytes derangements K 2.8 on admission, due to infectious process/diarrhea. He has norovirus, COVID, and rhinovirus.  - briefly required NE for  BP, now off - maintain K >4 and Mg >2 - Hold on GDMT and diuresis   Acute hypoxic resp failure, Rhinovius, COVID infection, ?CAP - on vent; mgmt per CCM - on zosyn /entecavir   Multiple Myeloma with Spinal METS - s/p recent CAR-T and fludarabine /cytoxan  lymphodepletion.  Seizure-like activity, Encephalopathy - post arrest - spot EEG negative for seizures, on cont - on keppra  - neuro following  Hypokalemia, Hypomagnesemia - aggressively supp for goal K>4, Mg>2  CRITICAL CARE Performed by: [name]  Total critical care time: 6 minutes  -Critical care time was exclusive of separately billable procedures and treating other patients. -Critical care was necessary to treat or prevent imminent or life-threatening deterioration. -Critical care was time spent personally by me on the following activities: development of treatment plan with patient and/or surrogate as well as nursing, discussions with consultants, evaluation of patient's response to treatment, examination of patient, obtaining history from patient or surrogate, ordering and performing treatments and interventions, ordering and review of laboratory studies, ordering and review of radiographic studies, pulse oximetry and re-evaluation of patient's condition.  Length of Stay: 1  [name], NP  02/11/2024, 8:46 AM  Advanced Heart Failure Team Pager [phone] (M-F; 7a - 5p)   Please visit [email or web address]: For overnight coverage please call cardiology fellow first. If fellow not available call Shock/ECMO MD on call.  For ECMO / Mechanical Support (Impella, IABP, LVAD) issues call Shock / ECMO MD on call.   Agree with above.   Remains unresponsive. Having continues video EEG now.   On normothermia protocol. Rhythm has been stable   POCUS ECHO done personally EF 35-40% (baseline)  General:  Comatosed  HEENT: +ETT EEG leads Neck: supple. no JVD.  Cor: Regular rate & rhythm. No rubs, gallops or murmurs. Lungs: clear Abdomen:  soft, nontender, nondistended.Good bowel sounds. Extremities: no cyanosis, clubbing, rash, edema Neuro:unresponsive  54 y/o male with multiple myeloma s/p previous chemo and stem cell transplant at [facility] in 2018. With recent recurrence and received CAR-T therapy.   Now with cardiac arrest in setting of COVID and Norovirus + Rhinovirus.   Suspect PEA vs electrolyte mediated arrest. Concern for prolonged down time and anoxic injury.   Currently supportive care. If has neuro recovery will need cath.   Manage electrolytes. Inotropic support as needed.   D/w CCM team as well as wife at bedside.   CRITICAL CARE Performed by: Bradley Hunt Sieving  Total critical care time: 55 minutes  Critical care time was exclusive of separately billable procedures and treating other patients.  Critical care was necessary to treat or prevent imminent or life-threatening deterioration.  Critical care was time spent personally by me (independent of midlevel providers or residents) on the following activities: development of treatment plan with patient and/or surrogate as well as nursing, discussions with consultants, evaluation of patient's response to treatment, examination of patient, obtaining history from patient or surrogate, ordering and performing treatments and interventions, ordering and review of laboratory studies, ordering and review of radiographic studies, pulse oximetry and re-evaluation of patient's condition.  Toribio Fuel, MD  12:20 PM    "

## 2024-02-11 NOTE — Plan of Care (Signed)
 " Problem: Education: Goal: Ability to manage disease process will improve 02/11/2024 1426 by Adam Ruther BROCKS, RN Outcome: Progressing 02/11/2024 1426 by Adam Ruther BROCKS, RN Outcome: Progressing   Problem: Cardiac: Goal: Ability to achieve and maintain adequate cardiopulmonary perfusion will improve 02/11/2024 1426 by Adam Ruther BROCKS, RN Outcome: Progressing 02/11/2024 1426 by Adam Ruther BROCKS, RN Outcome: Progressing   Problem: Neurologic: Goal: Promote progressive neurologic recovery 02/11/2024 1426 by Adam Ruther BROCKS, RN Outcome: Progressing 02/11/2024 1426 by Adam Ruther BROCKS, RN Outcome: Progressing   Problem: Skin Integrity: Goal: Risk for impaired skin integrity will be minimized. 02/11/2024 1426 by Adam Ruther BROCKS, RN Outcome: Progressing 02/11/2024 1426 by Adam Ruther BROCKS, RN Outcome: Progressing   Problem: Education: Goal: Knowledge of risk factors and measures for prevention of condition will improve 02/11/2024 1426 by Adam Ruther BROCKS, RN Outcome: Progressing 02/11/2024 1426 by Adam Ruther BROCKS, RN Outcome: Progressing   Problem: Coping: Goal: Psychosocial and spiritual needs will be supported 02/11/2024 1426 by Adam Ruther BROCKS, RN Outcome: Progressing 02/11/2024 1426 by Adam Ruther BROCKS, RN Outcome: Progressing   Problem: Respiratory: Goal: Will maintain a patent airway 02/11/2024 1426 by Adam Ruther BROCKS, RN Outcome: Progressing 02/11/2024 1426 by Adam Ruther BROCKS, RN Outcome: Progressing Goal: Complications related to the disease process, condition or treatment will be avoided or minimized 02/11/2024 1426 by Adam Ruther BROCKS, RN Outcome: Progressing 02/11/2024 1426 by Adam Ruther BROCKS, RN Outcome: Progressing   Problem: Education: Goal: Knowledge of General Education information will improve Description: Including pain rating scale, medication(s)/side effects and non-pharmacologic comfort measures 02/11/2024 1426 by Adam Ruther BROCKS, RN Outcome:  Progressing 02/11/2024 1426 by Adam Ruther BROCKS, RN Outcome: Progressing   Problem: Health Behavior/Discharge Planning: Goal: Ability to manage health-related needs will improve 02/11/2024 1426 by Adam Ruther BROCKS, RN Outcome: Progressing 02/11/2024 1426 by Adam Ruther BROCKS, RN Outcome: Progressing   Problem: Clinical Measurements: Goal: Ability to maintain clinical measurements within normal limits will improve 02/11/2024 1426 by Adam Ruther BROCKS, RN Outcome: Progressing 02/11/2024 1426 by Adam Ruther BROCKS, RN Outcome: Progressing Goal: Will remain free from infection 02/11/2024 1426 by Adam Ruther BROCKS, RN Outcome: Progressing 02/11/2024 1426 by Adam Ruther BROCKS, RN Outcome: Progressing Goal: Diagnostic test results will improve 02/11/2024 1426 by Adam Ruther BROCKS, RN Outcome: Progressing 02/11/2024 1426 by Adam Ruther BROCKS, RN Outcome: Progressing Goal: Respiratory complications will improve 02/11/2024 1426 by Adam Ruther BROCKS, RN Outcome: Progressing 02/11/2024 1426 by Adam Ruther BROCKS, RN Outcome: Progressing Goal: Cardiovascular complication will be avoided 02/11/2024 1426 by Adam Ruther BROCKS, RN Outcome: Progressing 02/11/2024 1426 by Adam Ruther BROCKS, RN Outcome: Progressing   Problem: Activity: Goal: Risk for activity intolerance will decrease 02/11/2024 1426 by Adam Ruther BROCKS, RN Outcome: Progressing 02/11/2024 1426 by Adam Ruther BROCKS, RN Outcome: Progressing   Problem: Nutrition: Goal: Adequate nutrition will be maintained 02/11/2024 1426 by Adam Ruther BROCKS, RN Outcome: Progressing 02/11/2024 1426 by Adam Ruther BROCKS, RN Outcome: Progressing   Problem: Coping: Goal: Level of anxiety will decrease 02/11/2024 1426 by Adam Ruther BROCKS, RN Outcome: Progressing 02/11/2024 1426 by Adam Ruther BROCKS, RN Outcome: Progressing   Problem: Elimination: Goal: Will not experience complications related to bowel motility 02/11/2024 1426 by Adam Ruther BROCKS, RN Outcome:  Progressing 02/11/2024 1426 by Adam Ruther BROCKS, RN Outcome: Progressing Goal: Will not experience complications related to urinary retention Outcome: Progressing   Problem: Pain Managment: Goal: General experience of comfort will improve and/or be controlled Outcome: Progressing   Problem: Safety: Goal:  Ability to remain free from injury will improve Outcome: Progressing   Problem: Skin Integrity: Goal: Risk for impaired skin integrity will decrease Outcome: Progressing   "

## 2024-02-11 NOTE — Progress Notes (Addendum)
 Patient having  prominent shivering type movements that abate when pressure is applied to his shoulders. Rapid EEG reconnected without ongoing seizure typically.

## 2024-02-11 NOTE — Progress Notes (Signed)
 PCCM Interval Progress Note  Called to the bedside for seizure-like activity. Patient posturing and then developing full body tremors. Initially appeared to be shivering, patient is on TTM, temperature 37.1 at the time of event but subsequently began again and biting on ETT, upward gaze and rhythmic blinking. Given 2 mg Ativan  and increased propofol . Started Keppra  and reached out to neurology.   Rexene LOISE Blush, PA-C 02/11/2024 3:54 AM [facility]  For contact information, see Amion.  After hours, 7PM- 7AM, please call on call APP for 2H.

## 2024-02-11 NOTE — TOC Initial Note (Signed)
 Transition of Care [facility]) - Initial/Assessment Note    Patient Details  Name: Bradley Hunt MRN: [id number] Date of Birth: 07/03/1970  Transition of Care [facility]) CM/SW Contact:    Arlana JINNY Nicholaus ISRAEL Phone Number: [phone] 02/11/2024, 3:20 PM  Clinical Narrative:   3:20 PM- HF CSW called to speak with the patients spouse, Delores over the phone. CSW left a VM. CSW will continue to make attempts to make contact with family.  HF CSW/CM will continue to follow and monitor for dc readiness.                       Patient Goals and CMS Choice            Expected Discharge Plan and Services                                              Prior Living Arrangements/Services                       Activities of Daily Living   ADL Screening (condition at time of admission) Independently performs ADLs?: Yes (appropriate for developmental age) Is the patient deaf or have difficulty hearing?: No Does the patient have difficulty seeing, even when wearing glasses/contacts?: No Does the patient have difficulty concentrating, remembering, or making decisions?: No  Permission Sought/Granted                  Emotional Assessment              Admission diagnosis:  Cardiac arrest (HCC) [I46.9] Seizure (HCC) [R56.9] Unresponsive [R41.89] Posturing episode [R29.3] Fall, initial encounter [W19.XXXA] Patient Active Problem List   Diagnosis Date Noted   Cardiac arrest (HCC) 02/10/2024   Cardiomyopathy (HCC) 09/22/2023   Port-A-Cath in place 05/27/2023   Acneiform rash 05/27/2023   Anemia due to antineoplastic chemotherapy 04/15/2023   New onset of headaches 12/18/2022   Thrombocytopenia 11/19/2022   Sinusitis 06/10/2022   Itching due to drug 12/05/2021   Non-compliance 07/23/2021   Herniated intervertebral disc of lumbar spine 03/27/2021   Compression fracture of body of thoracic vertebra (HCC) 03/23/2021   Essential hypertension 05/02/2020    Fever 01/24/2020   Infertility male 10/25/2019   Preventive measure 10/25/2019   Abdominal bloating 07/27/2019   Class 1 obesity due to excess calories in adult 07/27/2019   Chronic back pain greater than 3 months duration 01/19/2018   Encounter for screening for HIV 05/12/2017   Pancytopenia, acquired (HCC) 07/17/2016   Goals of care, counseling/discussion 05/10/2016   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   Constipation, chronic 11/03/2015   Drug-induced neutropenia 10/10/2015   Elevated serum creatinine 07/28/2015   Multiple myeloma in relapse (HCC) 07/18/2015   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 [place] [address] Phone: [phone] Fax: [phone]  DARRYLE LONG - [facility] [address] [place] [address] Phone: [phone] Fax: [phone]  [facility] [place], [place] - [address] [place] [address] Phone: [phone] Fax: [phone]  [facility] - [place], [place] - [phone] [address] [address] Phone: [phone] Fax: [phone]  [id number] [place], [place] - [address] AT [facility] OF [address] [place] [address] Phone: [phone] Fax: [phone]  [facility] [place], [place] - [address] [place] [address] Phone: [phone] Fax: [phone]     Social Drivers of Health (SDOH) Social History: SDOH Screenings   Food Insecurity: No Food Insecurity (02/11/2024)  Housing: Low Risk (02/11/2024)  Transportation Needs: No Transportation Needs (02/11/2024)  Utilities: Not At Risk (02/11/2024)   Depression (PHQ2-9): Low Risk (11/04/2023)  Social Connections: Socially Integrated (02/11/2024)  Tobacco Use: Medium Risk (12/09/2023)   SDOH Interventions:     Readmission Risk Interventions     No data to display

## 2024-02-12 DIAGNOSIS — I469 Cardiac arrest, cause unspecified: Secondary | ICD-10-CM | POA: Diagnosis not present

## 2024-02-12 DIAGNOSIS — D649 Anemia, unspecified: Secondary | ICD-10-CM | POA: Diagnosis not present

## 2024-02-12 DIAGNOSIS — U071 COVID-19: Secondary | ICD-10-CM | POA: Diagnosis not present

## 2024-02-12 DIAGNOSIS — R569 Unspecified convulsions: Secondary | ICD-10-CM | POA: Diagnosis not present

## 2024-02-12 DIAGNOSIS — K7200 Acute and subacute hepatic failure without coma: Secondary | ICD-10-CM

## 2024-02-12 DIAGNOSIS — E162 Hypoglycemia, unspecified: Secondary | ICD-10-CM | POA: Diagnosis not present

## 2024-02-12 DIAGNOSIS — J9601 Acute respiratory failure with hypoxia: Secondary | ICD-10-CM | POA: Diagnosis not present

## 2024-02-12 DIAGNOSIS — R253 Fasciculation: Secondary | ICD-10-CM | POA: Diagnosis not present

## 2024-02-12 LAB — HEPATIC FUNCTION PANEL
ALT: 67 U/L — ABNORMAL HIGH (ref 0–44)
AST: 37 U/L (ref 15–41)
Albumin: 3.5 g/dL (ref 3.5–5.0)
Alkaline Phosphatase: 57 U/L (ref 38–126)
Bilirubin, Direct: 0.2 mg/dL (ref 0.0–0.2)
Indirect Bilirubin: 0.3 mg/dL (ref 0.3–0.9)
Total Bilirubin: 0.5 mg/dL (ref 0.0–1.2)
Total Protein: 5.3 g/dL — ABNORMAL LOW (ref 6.5–8.1)

## 2024-02-12 LAB — BASIC METABOLIC PANEL WITH GFR
Anion gap: 10 (ref 5–15)
BUN: 19 mg/dL (ref 6–20)
CO2: 22 mmol/L (ref 22–32)
Calcium: 8.6 mg/dL — ABNORMAL LOW (ref 8.9–10.3)
Chloride: 112 mmol/L — ABNORMAL HIGH (ref 98–111)
Creatinine, Ser: 1.09 mg/dL (ref 0.61–1.24)
GFR, Estimated: >60 mL/min
Glucose, Bld: 94 mg/dL (ref 70–99)
Potassium: 3.9 mmol/L (ref 3.5–5.1)
Sodium: 144 mmol/L (ref 135–145)

## 2024-02-12 LAB — GLUCOSE, CAPILLARY
Glucose-Capillary: 97 mg/dL (ref 70–99)
Glucose-Capillary: 72 mg/dL (ref 70–99)
Glucose-Capillary: 79 mg/dL (ref 70–99)
Glucose-Capillary: 87 mg/dL (ref 70–99)
Glucose-Capillary: 116 mg/dL — ABNORMAL HIGH (ref 70–99)

## 2024-02-12 LAB — CBC
HCT: 32.2 % — ABNORMAL LOW (ref 39.0–52.0)
Hemoglobin: 11 g/dL — ABNORMAL LOW (ref 13.0–17.0)
MCH: 32.5 pg (ref 26.0–34.0)
MCHC: 34.2 g/dL (ref 30.0–36.0)
MCV: 95.3 fL (ref 80.0–100.0)
Platelets: 146 K/uL — ABNORMAL LOW (ref 150–400)
RBC: 3.38 MIL/uL — ABNORMAL LOW (ref 4.22–5.81)
RDW: 13.5 % (ref 11.5–15.5)
WBC: 4.8 K/uL (ref 4.0–10.5)
nRBC: 0 % (ref 0.0–0.2)

## 2024-02-12 LAB — MAGNESIUM: Magnesium: 2.2 mg/dL (ref 1.7–2.4)

## 2024-02-12 LAB — PHOSPHORUS: Phosphorus: 2.6 mg/dL (ref 2.5–4.6)

## 2024-02-12 MED ORDER — LIDOCAINE BOLUS VIA INFUSION
100.0000 mg | Freq: Once | INTRAVENOUS | Status: AC
  Administered 2024-02-12: 100 mg via INTRAVENOUS
  Filled 2024-02-12: qty 100

## 2024-02-12 MED ORDER — POTASSIUM CHLORIDE 20 MEQ PO PACK
40.0000 meq | PACK | Freq: Once | ORAL | Status: AC
  Administered 2024-02-12: 40 meq
  Filled 2024-02-12: qty 2

## 2024-02-12 MED ORDER — VITAL 1.5 CAL PO LIQD
1000.0000 mL | ORAL | Status: DC
  Administered 2024-02-12 – 2024-02-13 (×2): 1000 mL
  Filled 2024-02-12: qty 1000

## 2024-02-12 MED ORDER — MIDAZOLAM HCL (PF) 2 MG/2ML IJ SOLN
2.0000 mg | Freq: Once | INTRAMUSCULAR | Status: AC
  Administered 2024-02-12: 2 mg via INTRAVENOUS
  Filled 2024-02-12: qty 2

## 2024-02-12 MED ORDER — ENTECAVIR 0.5 MG PO TABS
0.5000 mg | ORAL_TABLET | Freq: Every day | ORAL | Status: DC
  Administered 2024-02-13 – 2024-02-14 (×2): 0.5 mg
  Filled 2024-02-12 (×2): qty 1

## 2024-02-12 MED ORDER — LIDOCAINE IN D5W 4-5 MG/ML-% IV SOLN
1.0000 mg/min | INTRAVENOUS | Status: DC
  Administered 2024-02-12 (×2): 1 mg/min via INTRAVENOUS
  Filled 2024-02-12 (×2): qty 500

## 2024-02-12 MED ORDER — MAGNESIUM SULFATE 2 GM/50ML IV SOLN
2.0000 g | Freq: Once | INTRAVENOUS | Status: AC
  Administered 2024-02-12: 2 g via INTRAVENOUS
  Filled 2024-02-12: qty 50

## 2024-02-12 NOTE — Procedures (Signed)
 VENTILATOR WEAN NOTE 02/12/2024  Start Mode: PRVC  Wean Mode: Pressure Support  Duration before failure: No wean   Reason for failure: No wean  Notes: No wean today due to increased sedation.

## 2024-02-12 NOTE — Progress Notes (Signed)
 PCCM interval progress note:  Intermittent shivering overnight, Tmax 100.4, improves with increased propofol  but BP soft  -continue cooling blanket and tylenol  -Mag 2g -Versed  2mg  x1  Shivering resolved after the above   Leita SAUNDERS Elena Davia, PA-C

## 2024-02-12 NOTE — Progress Notes (Addendum)
 "  NAMEFranck Hunt, MRN:  [id number], DOB:  10-19-1970, LOS: 2 ADMISSION DATE:  02/10/2024, CONSULTATION DATE:  1/13 REFERRING MD:  Dr. 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  S/p autologous stem cells 2018. HFrEF previously on Entresto, which was stopped during a previous admission for hypotension. Family reports he had been doing well. No complaints at all. In his usual state of health when a friend heard him collapse in the bathroom and called EMS. The patient was unresponsive. Upon EMS arrival the patient was pulseless and CPR was initiated. Shockable rhythm identified and he was shocked x 1. CPR duration a total of 10 mins. LMA was placed in the field and was removed when the patient began to gag. Remained unresponsive in the ED and was intubated for airway protection. Some concern for seizure vs posturing in the ED as well. Neurology and cardiology consulted. Ceribell EEG in ED negative for seizure. CTA chest and head ordered. Low dose norepi required. PCCM asked to admit.   Pertinent  Medical History   has a past medical history of Bone metastases, History of chemotherapy, History of radiation therapy, Multiple myeloma not having achieved remission (HCC) (06/25/2015), and Numbness.   Significant Hospital Events: Including procedures, antibiotic start and stop dates in addition to other pertinent events   1/13 admit following cardiac arrest  Interim History / Subjective:  Hypoglycemic overnight, TF still on 40cc/h Shivering frequently overnight, no seizures on EEG. Long run of VT this morning. Tmax 100.8.  Objective    Blood pressure 99/65, pulse 79, temperature 99 F (37.2 C), resp. rate 18, height 6' 2 (1.88 m), weight 111.1 kg, SpO2 100%.    Vent Mode: PRVC FiO2 (%):  [40 %-100 %] 40 % Set Rate:  [18 bmp] 18  bmp Vt Set:  [660 mL] 660 mL PEEP:  [5 cmH20] 5 cmH20 Plateau Pressure:  [19 cmH20-25 cmH20] 19 cmH20   Intake/Output Summary (Last 24 hours) at 02/12/2024 1016 Last data filed at 02/12/2024 0600 Gross per 24 hour  Intake 2477.09 ml  Output 750 ml  Net 1727.09 ml   Filed Weights   02/10/24 2217 02/11/24 0716 02/12/24 0500  Weight: 102.5 kg 110.9 kg 111.1 kg    Examination: General: critically ill appearing man lying in bed in NAD Neuro: on fent & propofol > decreasing doses. Opens eyes to beginning of physical exam, only some noxious stimuli. Not to verbal stimulation, not tracking, not sure if blinking to threat. No withdrawal from pain x 4 extremities, no response to trap squeeze. + cough, no gag.  No response to wife's voice. HENT: [place]/AT< eyes anicteric Lungs: CTAB, minimal clear secretions from ETT. P plat 24 Cardiovascular: S1S2, RRR Abdomen: soft, NT Extremities: no edema  I/O +1.6L  BUN 19 Cr 1.09 WBC 4.8 H/H 11/32.2 Platelets 146    Resolved problem list  Shock  Malpositioned ETT  AKI  Assessment and Plan  OOH CA- likely from electrolyte abnormalities Frequent VT Chronic HFrEF, baseline EF 35-40%> now 20-25% -holding PTA GDMT and antihypertensives  -adding lidocaine  w/ bolus -40mEq K+ -monitor electrolytes -tele monitoring -consider LHC if neuro recovery   Covid, not likely covid pneumonia -airborne precautions  Acute respiratory failure with hypoxia Likely aspiration pneumonia vs pneumonitis -con't zosyn  -LTVV -VAP prevention protocol -PAD protocol- goal is wake up today- d/w neuro -daily SAT & SBT  as appropriate; currently mental status precludes   Concern for anoxic brain injury Concern for myoclonus Acute encephalopathy -keppra  -wean sedation -neuroprotective measures-- treat fevers, avoid major electrolyte swings, avoid hypocapnia, hypoxia, hypoglycemia   Immunocompromised, previous CAR-t for MM -con't vericlude, acyclovir , Bactrim  MWF,  entecavir  -broad spectrum antibiotics- zosyn  -plans for IVIG as OP on hold   Hypernatremia, resolved -monitor    Anemia -strict I/O -renally dose meds, avoid nephrotoxic meds   Hypotension, resolved -monitor off pressors   Mild shock liver -supportive care  Diarrhea due to rotavirus, norovirus, and E coli> diarrhea resolved PTA -zosyn  -enteric precautions -maintain adequate volume status  Hypoglycemia -con't TF -no insulin ordered  At risk for malnutrition -TF, titrate up to goal  No heparin / pork products due to religious preference  Wife updated at bedside. Most of his care is supportive until he wakes up; she understands the risk of neurologic injury and the possibility that he does not wake up or may wake up with deficits.  We did not discuss long-term treatment options for if he does not wake up yet.   GI: pepcid  (QT WNL) DVT: fondaparinux      Labs   CBC: Recent Labs  Lab 02/10/24 1809 02/10/24 1922 02/10/24 2009 02/10/24 2314 02/11/24 0937 02/12/24 0327  WBC 8.8  --   --  4.0 6.3 4.8  HGB 11.1* 11.6* 11.6* 10.9* 11.6* 11.0*  HCT 33.7* 34.0* 34.0* 32.3* 33.3* 32.2*  MCV 98.3  --   --  94.4 94.6 95.3  PLT 177  --   --  137* 158 146*    Basic Metabolic Panel: Recent Labs  Lab 02/10/24 1809 02/10/24 1922 02/10/24 2009 02/10/24 2314 02/11/24 0937 02/11/24 1630 02/12/24 0327  NA 143 144 143 143 146* 146* 144  K 2.8* 2.8* 2.6* 3.4* 3.7 3.9 3.9  CL 110 108  --  110 111 113* 112*  CO2 17*  --   --  19* 23 23 22   GLUCOSE 148* 153*  --  106* 96 88 94  BUN 11 11  --  16 19 20 19   CREATININE 1.22 1.30*  --  1.39* 1.05 1.05 1.09  CALCIUM  8.0*  --   --  8.8* 8.8* 8.5* 8.6*  MG 1.5*  --   --  2.3 2.1 2.0 2.2  PHOS  --   --   --  3.1 3.4 2.7 2.6   GFR: Estimated Creatinine Clearance: 102.8 mL/min (by C-G formula based on SCr of 1.09 mg/dL). Recent Labs  Lab 02/10/24 1809 02/10/24 1921 02/10/24 2110 02/10/24 2314 02/11/24 0937 02/12/24 0327   WBC 8.8  --   --  4.0 6.3 4.8  LATICACIDVEN  --  4.8* 2.4*  --   --   --      This patient is critically ill with multiple organ system failure which requires frequent high complexity decision making, assessment, support, evaluation, and titration of therapies. This was completed through the application of advanced monitoring technologies and extensive interpretation of multiple databases. During this encounter critical care time was devoted to patient care services described in this note for 46 minutes.  Leita SHAUNNA Gaskins, DO 02/12/24 11:04 AM [facility]  For contact information, see Amion. If no response to pager, please call PCCM 2H APP. After hours, 7PM- 7AM, please call on call APP for 2H.      "

## 2024-02-12 NOTE — Procedures (Addendum)
 Patient Name: Bradley Hunt  MRN: [id number]  Epilepsy Attending: Arlin MALVA Krebs  Referring Physician/Provider: Khaliqdina, Salman, MD  Duration: 02/11/2024 1035 to 02/12/2024 1035  Patient history: 54 year old male status post cardiac arrest. EEG to evaluate for seizure   Level of alertness: comatose  AEDs during EEG study: LEV, propofol   Technical aspects: This EEG study was done with scalp electrodes positioned according to the 10-20 International system of electrode placement. Electrical activity was reviewed with band pass filter of 1-70Hz , sensitivity of 7 uV/mm, display speed of 64mm/sec with a 60Hz  notched filter applied as appropriate. EEG data were recorded continuously and digitally stored.  Video monitoring was available and reviewed as appropriate.  Description: EEG showed continuous generalized 3-6hz  theta-delta slowing admixed with 12-14hz  beta activity. Hyperventilation and photic stimulation were not performed.     Event button was pressed on 02/11/2024 at 1942 for tremor like movements.  Concomitant EEG before, during and after the did not show any EEG changes to suggest seizure.  ABNORMALITY - Continuous slow, generalized  IMPRESSION: This study is suggestive of generalized cerebral dysfunction (encephalopathy) nonspecific etiology but likely related to sedation. No seizures or epileptiform discharges were seen throughout the recording.  Event button was pressed on 02/11/2024 at 1942 for tremor like movements without concomitant EEG change. This was most likely NOT an epileptic event.  Heide Brossart O Taliana Mersereau

## 2024-02-12 NOTE — Care Management (Signed)
 Transition of Care [facility]) - Inpatient Brief Assessment   Patient Details  Name: Bradley Hunt MRN: [id number] Date of Birth: 09-15-1970  [facility] CM/SW Contact:    Corean JAYSON Canary, RN Phone Number: 02/12/2024, 11:19 AM   Clinical Narrative:  Patient  presented to ICU post arrest. He was in the bathroom and a friend heard him fall.  The patient is intubated and mildly sedated on propofol . Md assessment reveals some eye opening. EEG with encephalopathy.    Will continue to monitor for needs  Transition of Care Asessment: Insurance and Status: Insurance coverage has been reviewed Patient has primary care physician: Yes Home environment has been reviewed: lives with spouse Prior level of function:: Independent Prior/Current Home Services: No current home services Social Drivers of Health Review: SDOH reviewed no interventions necessary Readmission risk has been reviewed: Yes Transition of care needs: transition of care needs identified, TOC will continue to follow

## 2024-02-12 NOTE — Progress Notes (Signed)
 Hypoglycemic Event  CBG: 69  Treatment: D50 25 mL (12.5 gm)  Symptoms: None  Follow-up CBG: Time:2038 CBG Result:138   Possible Reasons for Event: Inadequate meal intake  Comments/MD notified:PA notified and aware, new orders for different TF placed by DR. Gretta Adolphus JAYSON Geryl, RN

## 2024-02-12 NOTE — Progress Notes (Signed)
 NEUROLOGY CONSULT FOLLOW UP NOTE   Date of service: February 12, 2024 Patient Name: Bradley Hunt MRN:  [id number] DOB:  Dec 25, 1970  Interval Hx/subjective   LTM read negative for seizures, shows general slowing likely due to sedation.  Patient is still on propofol , at 50 and Fentanyl  at 125. Had intermittent shivering overnight that improved with increased propofol . He is also on a cooling blanket, was given Mag and Versed .   Vitals   Vitals:   02/12/24 0630 02/12/24 0645 02/12/24 0649 02/12/24 0700  BP: 102/64 95/67  100/66  Pulse: 74   71  Resp: 18 18 18 18   Temp: 98.8 F (37.1 C) 99 F (37.2 C) 99.1 F (37.3 C) 99.1 F (37.3 C)  TempSrc:      SpO2: 100%   100%  Weight:      Height:         Body mass index is 31.45 kg/m.  Physical Exam   Constitutional: critically ill intubated on sedation  Cardiovascular: Normal rate and regular rhythm.   Respiratory: on ventilator   Neurologic Examination   Mental Status -  Sedated. Eyes closed, does not open eyes to loud stimulation. Does not follow commands, no spontaneous movement  Cranial Nerves II - XII - Visual field no blink to threat. PERRL, disconjugate upward gaze, corneal reflex intact bilaterally, face symmetry hard to assess due to ETT, + cough, no gag, all 4 extremities flaccid to noxious stimuli    Medications Current Medications[1]  Labs and Diagnostic Imaging   CBC:  Recent Labs  Lab 02/11/24 0937 02/12/24 0327  WBC 6.3 4.8  HGB 11.6* 11.0*  HCT 33.3* 32.2*  MCV 94.6 95.3  PLT 158 146*    Basic Metabolic Panel:  Lab Results  Component Value Date   NA 144 02/12/2024   K 3.9 02/12/2024   CO2 22 02/12/2024   GLUCOSE 94 02/12/2024   BUN 19 02/12/2024   CREATININE 1.09 02/12/2024   CALCIUM  8.6 (L) 02/12/2024   GFRNONAA >60 02/12/2024   GFRAA >60 10/18/2019   Lipid Panel: No results found for: LDLCALC HgbA1c: No results found for: HGBA1C Urine Drug Screen: No results found for:  LABOPIA, COCAINSCRNUR, LABBENZ, AMPHETMU, THCU, LABBARB  Alcohol Level     Component Value Date/Time   ETH <15 02/10/2024 2314   INR  Lab Results  Component Value Date   INR 1.12 07/23/2017   APTT  Lab Results  Component Value Date   APTT 27 07/08/2016   AED levels: No results found for: PHENYTOIN, ZONISAMIDE, LAMOTRIGINE, LEVETIRACETA  CT Head without contrast(Personally reviewed): No acute process  Ceribell EEG 1/14:  - Continuous slow, generalized IMPRESSION: This limited ceribell EEG is suggestive of generalized nonspecific cerebral dysfunction (encephalopathy). No seizures or epileptiform discharges were seen throughout the recording.  LTM EEG 1/15: Continuous slow, generalized. This study is suggestive of generalized cerebral dysfunction (encephalopathy) nonspecific etiology but likely related to sedation. No seizures or epileptiform discharges were seen throughout the recording.   Assessment   Bradley Hunt is a 54 y.o. male   with abnormal movements following cardiac arrest.  These movements were captured on rapid limited montage EEG and did not appear to have any ictal correlate.  Overnight 1/14, had some shivering/twitching and was connected to Rapid EEG with no seizure activity noted. Last night, continued to have intermittent shivering that resolved with increased propofol . LTM read shows no seizures.   Neurology exam consistent with yesterday's. It is limited by sedation however.  Recommendations  - Seizure precautions - Wean Propofol  by 10/hour. Ok for intermittent shivering. Goal is to wake up as much as possible to get accurate neuro exam.  - Continue Ltm while weaning sedation - Continue Keppra  500 mg BID  - Mri brain wo contrast between day 3-5. Earliest we can get is tomorrow - Neurology will continue to follow   Discussed with Dr. Gretta, CCM  ______________________________________________________________________   Signed, Rocky JAYSON Likes, NP [facility]   NEUROHOSPITALIST ADDENDUM Performed a face to face diagnostic evaluation.   I have reviewed the contents of history and physical exam as documented by PA/ARNP/Resident and agree with above documentation.  I have discussed and formulated the above plan as documented. Edits to the note have been made as needed.   Kevona Lupinacci, MD [facility] [phone]   If 7pm to 7am, please call on call as listed on AMION.        [1]  Current Facility-Administered Medications:    acetaminophen  (TYLENOL ) tablet 650 mg, 650 mg, Oral, Q4H PRN **OR** acetaminophen  (TYLENOL ) 160 MG/5ML solution 650 mg, 650 mg, Per Tube, Q4H PRN, 650 mg at 02/12/24 0113 **OR** acetaminophen  (TYLENOL ) suppository 650 mg, 650 mg, Rectal, Q4H PRN, Rosan Deward ORN, NP   acyclovir  (ZOVIRAX ) 200 MG/5ML suspension SUSP 800 mg, 800 mg, Per Tube, BID, Bowser, Grace E, NP, 800 mg at 02/11/24 2151   busPIRone  (BUSPAR ) tablet 30 mg, 30 mg, Per Tube, Q8H PRN, Bowser, Ronnald BRAVO, NP, 30 mg at 02/11/24 2150   Chlorhexidine  Gluconate Cloth 2 % PADS 6 each, 6 each, Topical, Daily, Rosan Deward ORN, NP, 6 each at 02/11/24 1012   entecavir  (BARACLUDE ) tablet 0.5 mg, 0.5 mg, Per Tube, Q24H, Bowser, Grace E, NP, 0.5 mg at 02/11/24 1148   famotidine  (PEPCID ) tablet 20 mg, 20 mg, Per Tube, BID, Hoffman, Paul W, NP, 20 mg at 02/11/24 2151   feeding supplement (VITAL 1.5 CAL) liquid 1,000 mL, 1,000 mL, Per Tube, Continuous, Bowser, Ronnald BRAVO, NP, Stopped at 02/11/24 2300   feeding supplement (VITAL HIGH PROTEIN) liquid 1,000 mL, 1,000 mL, Per Tube, Q24H, Gretta Leita SQUIBB, DO, Infusion Verify at 02/12/24 0433   fentaNYL  (SUBLIMAZE ) bolus via infusion 25-100 mcg, 25-100 mcg, Intravenous, Q15 min PRN, Hoffman, Paul W, NP, 100 mcg at 02/11/24 2107   fentaNYL  in NS (43mcg/ml) infusion-PREMIX, 0-400 mcg/hr, Intravenous, Continuous, Rosan Deward ORN, NP, Last Rate: 17.5 mL/hr at 02/12/24 0433,  175 mcg/hr at 02/12/24 0433   fondaparinux  (ARIXTRA ) injection 2.5 mg, 2.5 mg, Subcutaneous, Q24H, Rosan Deward ORN, NP, 2.5 mg at 02/11/24 1012   levETIRAcetam  (KEPPRA ) undiluted injection 500 mg, 500 mg, Intravenous, Q12H, Wilson, Tara N, PA-C, 500 mg at 02/11/24 2152   norepinephrine  (LEVOPHED ) 4mg  in (0.016 mg/mL) premix infusion, 0-10 mcg/min, Intravenous, Titrated, Rosan Deward ORN, NP   ondansetron  (ZOFRAN ) injection 4 mg, 4 mg, Intravenous, Q6H PRN, Rosan Deward ORN, NP   Oral care mouth rinse, 15 mL, Mouth Rinse, Q2H, Rosan Deward ORN, NP, 15 mL at 02/12/24 0630   Oral care mouth rinse, 15 mL, Mouth Rinse, PRN, Rosan Deward ORN, NP   piperacillin -tazobactam (ZOSYN ) IVPB 3.375 g, 3.375 g, Intravenous, Q8H, Salam, Savannah B, RPH, Last Rate: 12.5 mL/hr at 02/12/24 0653, 3.375 g at 02/12/24 9346   polyethylene glycol (MIRALAX  / GLYCOLAX ) packet 17 g, 17 g, Per Tube, Daily PRN, Bowser, Ronnald BRAVO, NP   propofol  (DIPRIVAN ) 1000 MG/100ML infusion, 0-80 mcg/kg/min, Intravenous, Titrated, Tegeler, Lonni PARAS, MD, Last  Rate: 32.1 mL/hr at 02/12/24 0651, 50 mcg/kg/min at 02/12/24 0651   senna (SENOKOT) tablet 8.6 mg, 1 tablet, Per Tube, Daily PRN, Daren Ronnald BRAVO, NP   [START ON 02/13/2024] sulfamethoxazole -trimethoprim  (BACTRIM ) 200-40 MG/5ML suspension 20 mL, 20 mL, Per Tube, Q M,W,F, Gretta Leita SQUIBB, DO

## 2024-02-12 NOTE — Progress Notes (Signed)
 "    Advanced Heart Failure Rounding Note  Cardiologist: None  [facility] Cardiologist: Dr. Cherrie Chief Complaint: Cardiac Arrest Patient Profile   Bradley Hunt is a 54 y.o. 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  Significant events:   1/13: OOH cardiac arrest w/o bystander CPR. Shockable rhythm by EMS, intubated. ?Seizure  Subjective:    Remains intubated.   Overnight had some shivering and twitching. EEG unrevealing  Remains comatose on propofol  and fentanyl .   Several 15 beat runs of VT overnight. Lidocaine  added   Echo done and read as 20-25% (I think more like 30-35%)  Objective:    Weight Range: 111.1 kg Body mass index is 31.45 kg/m.   Vital Signs:   Temp:  [97.3 F (36.3 C)-100.8 F (38.2 C)] 98.4 F (36.9 C) (01/15 1245) Pulse Rate:  [64-112] 75 (01/15 1245) Resp:  [17-37] 17 (01/15 1245) BP: (69-130)/(49-93) 130/93 (01/15 1245) SpO2:  [70 %-100 %] 100 % (01/15 1245) FiO2 (%):  [40 %-100 %] 40 % (01/15 1237) Weight:  [111.1 kg] 111.1 kg (01/15 0500) Last BM Date :  (PTA)  Weight change: Filed Weights   02/10/24 2217 02/11/24 0716 02/12/24 0500  Weight: 102.5 kg 110.9 kg 111.1 kg   Intake/Output:  Intake/Output Summary (Last 24 hours) at 02/12/2024 1439 Last data filed at 02/12/2024 1337 Gross per 24 hour  Intake 3054.71 ml  Output 990 ml  Net 2064.71 ml    Physical Exam   General: Unresponsive on vent  HEENT: normal + ETT and ECG electrodes Neck: supple. no JVD.  Cor: Regular rate & rhythm. No rubs, gallops or murmurs. Lungs: clear Abdomen: soft, nontender, nondistended.Good bowel sounds. Extremities: no cyanosis, clubbing, rash, edema Neuro: unresponsive   Telemetry   SR 70s  two 15 beat runs of NSVT Personally reviewed  Labs   CBC Recent Labs    02/11/24 0937 02/12/24 0327  WBC 6.3 4.8  HGB 11.6* 11.0*  HCT 33.3* 32.2*  MCV 94.6 95.3  PLT 158 146*   Basic Metabolic Panel Recent Labs    98/85/73 1630 02/12/24 0327  NA 146* 144  K 3.9 3.9  CL 113* 112*  CO2 23 22  GLUCOSE 88 94  BUN 20 19  CREATININE 1.05 1.09  CALCIUM  8.5* 8.6*  MG 2.0 2.2  PHOS 2.7 2.6   Liver Function Tests Recent Labs    02/11/24 0937 02/12/24 0327  AST 50* 37  ALT 81* 67*  ALKPHOS 65 57  BILITOT 0.4 0.5  PROT 6.0* 5.3*  ALBUMIN 4.0 3.5   Recent Labs    02/10/24 1809  LIPASE 86*   ProBNP (last 3 results) Recent Labs    02/10/24 1809  PROBNP 83.3   Medications:    Scheduled Medications:  acyclovir   800 mg Per Tube BID   Chlorhexidine  Gluconate Cloth  6 each Topical Daily   [START ON 02/13/2024] entecavir   0.5 mg Per Tube Daily   famotidine   20 mg Per Tube BID   fondaparinux  (ARIXTRA ) injection  2.5 mg Subcutaneous Q24H   levETIRAcetam   500 mg Intravenous Q12H   mouth rinse  15 mL Mouth Rinse Q2H   [START ON 02/13/2024] sulfamethoxazole -trimethoprim   20 mL Per Tube Q M,W,F    Infusions:  feeding supplement (VITAL 1.5 CAL)  60 mL/hr at 02/12/24 1337   fentaNYL  infusion INTRAVENOUS 175 mcg/hr (02/12/24 1200)   lidocaine  1 mg/min (02/12/24 1419)   norepinephrine  (LEVOPHED ) Adult infusion     piperacillin -tazobactam (ZOSYN )  IV 3.375 g (02/12/24 1338)   propofol  (DIPRIVAN ) infusion 30 mcg/kg/min (02/12/24 1315)    PRN Medications: acetaminophen  **OR** acetaminophen  (TYLENOL ) oral liquid 160 mg/5 mL **OR** acetaminophen , busPIRone , fentaNYL , ondansetron  (ZOFRAN ) IV, mouth rinse, polyethylene glycol, senna  Assessment/Plan   Cardiac Arrest VT/VF, Acute on chronic HFrEF: Found unresponsive by friend, no bystander CPR. Shockable rhythm on EMS arrival, shock x1. Known EF 35-40%. In the setting of severe electrolytes derangements K 2.8 on admission, due  to infectious process/diarrhea. He has norovirus, COVID, and rhinovirus.  - Echo done and read as 20-25% (I think more like 30-35%) - briefly required NE for BP, now off - maintain K >4 and Mg >2 - Hold on GDMT and diuresis  - He had some VT this am. Lidocaine  added Will continue for now. Can consider switch to amio - If has neuro recover will need cath   Acute hypoxic resp failure, Rhinovius, COVID infection, ?CAP - on vent; mgmt per CCM - on zosyn /entecavir   Multiple Myeloma with Spinal METS - s/p recent CAR-T and fludarabine /cytoxan  lymphodepletion.  Seizure-like activity, Encephalopathy - post arrest - EEG negative so far  - on keppra  - neuro following  Hypokalemia, Hypomagnesemia - supp as needed  CRITICAL CARE Performed by: Toribio Fuel  Total critical care time: 33 minutes  -Critical care time was exclusive of separately billable procedures and treating other patients. -Critical care was necessary to treat or prevent imminent or life-threatening deterioration. -Critical care was time spent personally by me on the following activities: development of treatment plan with patient and/or surrogate as well as nursing, discussions with consultants, evaluation of patient's response to treatment, examination of patient, obtaining history from patient or surrogate, ordering and performing treatments and interventions, ordering and review of laboratory studies, ordering and review of radiographic studies, pulse oximetry and re-evaluation of patient's condition.  Length of Stay: 2  Toribio Fuel, MD  02/12/2024, 2:39 PM  Advanced Heart Failure Team Pager [phone] (M-F; 7a - 5p)   Please visit [email or web address]: For overnight coverage please call cardiology fellow first. If fellow not available call Shock/ECMO MD on call.  For ECMO / Mechanical Support (Impella, IABP, LVAD) issues call Shock / ECMO MD on call.     "

## 2024-02-12 NOTE — TOC Progression Note (Signed)
 Transition of Care [facility]) - Progression Note    Patient Details  Name: Bradley Hunt MRN: [id number] Date of Birth: 09/05/1970  [facility]) CM/SW Contact  Arlana JINNY Moats, LCSWA Phone Number: 02/12/2024, 12:22 PM  Clinical Narrative:   12:22  PM- HF CSW called to speak with the patients spouse, Delores  or sisterover the phone. CSW left a VM. CSW will continue to make attempts to make contact with family.   HF CSW/CM will continue to follow and monitor for dc readiness.                       Expected Discharge Plan and Services                                               Social Drivers of Health (SDOH) Interventions SDOH Screenings   Food Insecurity: No Food Insecurity (02/11/2024)  Housing: Low Risk (02/11/2024)  Transportation Needs: No Transportation Needs (02/11/2024)  Utilities: Not At Risk (02/11/2024)  Depression (PHQ2-9): Low Risk (11/04/2023)  Social Connections: Socially Integrated (02/11/2024)  Tobacco Use: Medium Risk (12/09/2023)    Readmission Risk Interventions     No data to display

## 2024-02-13 ENCOUNTER — Inpatient Hospital Stay (HOSPITAL_COMMUNITY)

## 2024-02-13 DIAGNOSIS — G9340 Encephalopathy, unspecified: Secondary | ICD-10-CM | POA: Diagnosis not present

## 2024-02-13 DIAGNOSIS — I5020 Unspecified systolic (congestive) heart failure: Secondary | ICD-10-CM

## 2024-02-13 DIAGNOSIS — U071 COVID-19: Secondary | ICD-10-CM | POA: Diagnosis not present

## 2024-02-13 DIAGNOSIS — I469 Cardiac arrest, cause unspecified: Secondary | ICD-10-CM | POA: Diagnosis not present

## 2024-02-13 DIAGNOSIS — R569 Unspecified convulsions: Secondary | ICD-10-CM | POA: Diagnosis not present

## 2024-02-13 DIAGNOSIS — Z9911 Dependence on respirator [ventilator] status: Secondary | ICD-10-CM

## 2024-02-13 DIAGNOSIS — J9601 Acute respiratory failure with hypoxia: Secondary | ICD-10-CM | POA: Diagnosis not present

## 2024-02-13 DIAGNOSIS — E162 Hypoglycemia, unspecified: Secondary | ICD-10-CM | POA: Diagnosis not present

## 2024-02-13 LAB — CBC
HCT: 30.4 % — ABNORMAL LOW (ref 39.0–52.0)
Hemoglobin: 10.2 g/dL — ABNORMAL LOW (ref 13.0–17.0)
MCH: 32.3 pg (ref 26.0–34.0)
MCHC: 33.6 g/dL (ref 30.0–36.0)
MCV: 96.2 fL (ref 80.0–100.0)
Platelets: 144 K/uL — ABNORMAL LOW (ref 150–400)
RBC: 3.16 MIL/uL — ABNORMAL LOW (ref 4.22–5.81)
RDW: 13.3 % (ref 11.5–15.5)
WBC: 3.5 K/uL — ABNORMAL LOW (ref 4.0–10.5)
nRBC: 0 % (ref 0.0–0.2)

## 2024-02-13 LAB — BASIC METABOLIC PANEL WITH GFR
Anion gap: 10 (ref 5–15)
BUN: 19 mg/dL (ref 6–20)
CO2: 21 mmol/L — ABNORMAL LOW (ref 22–32)
Calcium: 8.8 mg/dL — ABNORMAL LOW (ref 8.9–10.3)
Chloride: 112 mmol/L — ABNORMAL HIGH (ref 98–111)
Creatinine, Ser: 1.05 mg/dL (ref 0.61–1.24)
GFR, Estimated: >60 mL/min
Glucose, Bld: 101 mg/dL — ABNORMAL HIGH (ref 70–99)
Potassium: 3.6 mmol/L (ref 3.5–5.1)
Sodium: 142 mmol/L (ref 135–145)

## 2024-02-13 LAB — GLUCOSE, CAPILLARY
Glucose-Capillary: 102 mg/dL — ABNORMAL HIGH (ref 70–99)
Glucose-Capillary: 111 mg/dL — ABNORMAL HIGH (ref 70–99)
Glucose-Capillary: 115 mg/dL — ABNORMAL HIGH (ref 70–99)
Glucose-Capillary: 132 mg/dL — ABNORMAL HIGH (ref 70–99)
Glucose-Capillary: 80 mg/dL (ref 70–99)
Glucose-Capillary: 94 mg/dL (ref 70–99)

## 2024-02-13 LAB — HEPATIC FUNCTION PANEL
ALT: 48 U/L — ABNORMAL HIGH (ref 0–44)
AST: 23 U/L (ref 15–41)
Albumin: 3.3 g/dL — ABNORMAL LOW (ref 3.5–5.0)
Alkaline Phosphatase: 52 U/L (ref 38–126)
Bilirubin, Direct: 0.2 mg/dL (ref 0.0–0.2)
Indirect Bilirubin: 0.3 mg/dL (ref 0.3–0.9)
Total Bilirubin: 0.5 mg/dL (ref 0.0–1.2)
Total Protein: 5.2 g/dL — ABNORMAL LOW (ref 6.5–8.1)

## 2024-02-13 LAB — CULTURE, BLOOD (ROUTINE X 2): Special Requests: ADEQUATE

## 2024-02-13 LAB — MAGNESIUM: Magnesium: 2.2 mg/dL (ref 1.7–2.4)

## 2024-02-13 LAB — PHOSPHORUS: Phosphorus: 3.2 mg/dL (ref 2.5–4.6)

## 2024-02-13 MED ORDER — DEXMEDETOMIDINE HCL IN NACL 400 MCG/100ML IV SOLN
0.0000 ug/kg/h | INTRAVENOUS | Status: DC
  Administered 2024-02-13: 0.5 ug/kg/h via INTRAVENOUS
  Administered 2024-02-13: 0.4 ug/kg/h via INTRAVENOUS
  Filled 2024-02-13: qty 100

## 2024-02-13 MED ORDER — AMIODARONE HCL 200 MG PO TABS
200.0000 mg | ORAL_TABLET | Freq: Two times a day (BID) | ORAL | Status: DC
  Administered 2024-02-13 – 2024-02-19 (×13): 200 mg via ORAL
  Filled 2024-02-13 (×13): qty 1

## 2024-02-13 MED ORDER — LIDOCAINE 5 % EX PTCH
1.0000 | MEDICATED_PATCH | Freq: Every day | CUTANEOUS | Status: DC
  Administered 2024-02-13 – 2024-02-18 (×6): 1 via TRANSDERMAL
  Filled 2024-02-13 (×6): qty 1

## 2024-02-13 MED ORDER — PROSOURCE TF20 ENFIT COMPATIBL EN LIQD: 60.0000 mL | Freq: Every day | ENTERAL | Status: DC

## 2024-02-13 MED ORDER — PROSOURCE TF20 ENFIT COMPATIBL EN LIQD
60.0000 mL | Freq: Two times a day (BID) | ENTERAL | Status: DC
  Administered 2024-02-13: 60 mL
  Filled 2024-02-13 (×2): qty 60

## 2024-02-13 MED ORDER — POTASSIUM CHLORIDE 20 MEQ PO PACK
40.0000 meq | PACK | Freq: Once | ORAL | Status: AC
  Administered 2024-02-13: 40 meq
  Filled 2024-02-13: qty 2

## 2024-02-13 NOTE — Progress Notes (Signed)
 "  NAMEYasseen Salls, MRN:  [id number], DOB:  Oct 04, 1970, LOS: 3 ADMISSION DATE:  02/10/2024, CONSULTATION DATE:  1/13 REFERRING MD:  Dr. 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  S/p autologous stem cells 2018. HFrEF previously on Entresto , which was stopped during a previous admission for hypotension. Family reports he had been doing well. No complaints at all. In his usual state of health when a friend heard him collapse in the bathroom and called EMS. The patient was unresponsive. Upon EMS arrival the patient was pulseless and CPR was initiated. Shockable rhythm identified and he was shocked x 1. CPR duration a total of 10 mins. LMA was placed in the field and was removed when the patient began to gag. Remained unresponsive in the ED and was intubated for airway protection. Some concern for seizure vs posturing in the ED as well. Neurology and cardiology consulted. Ceribell EEG in ED negative for seizure. CTA chest and head ordered. Low dose norepi required. PCCM asked to admit.   Pertinent  Medical History   has a past medical history of Bone metastases, History of chemotherapy, History of radiation therapy, Multiple myeloma not having achieved remission (HCC) (06/25/2015), and Numbness.   Significant Hospital Events: Including procedures, antibiotic start and stop dates in addition to other pertinent events   1/13 admit following cardiac arrest. Found to be COVID+.  Interim History / Subjective:  Remains intubated. PRVC 18x660, 5, 40%. +2.L  yday (incl 500 mL just from fentanyl , and 500 mL from propofol  which is already off). Required increase in propofol  overnight due to agitation when sedation weaned.  Objective    Blood pressure 103/62, pulse 70, temperature 98.6 F (37 C), resp. rate 19, height 6' 2 (1.88 m), weight  109.8 kg, SpO2 100%.    Vent Mode: PRVC FiO2 (%):  [40 %] 40 % Set Rate:  [18 bmp] 18 bmp Vt Set:  [660 mL] 660 mL PEEP:  [5 cmH20] 5 cmH20 Plateau Pressure:  [19 cmH20] 19 cmH20   Intake/Output Summary (Last 24 hours) at 02/13/2024 0719 Last data filed at 02/13/2024 0600 Gross per 24 hour  Intake 3249.67 ml  Output 785 ml  Net 2464.67 ml   Filed Weights   02/11/24 0716 02/12/24 0500 02/13/24 0130  Weight: 110.9 kg 111.1 kg 109.8 kg    Examination: General: critically ill appearing man lying in bed Neuro: on fent & propofol > decreasing doses. Opens eyes to beginning of physical exam, only some noxious stimuli. Not to verbal stimulation, not tracking, not sure if blinking to threat. No withdrawal from pain x 4 extremities, no response to trap squeeze. + cough, no gag.  No response to wife's voice. HENT: [place]/AT< eyes anicteric Lungs: CTAB, minimal clear secretions from ETT. P plat 24 Cardiovascular: S1S2, RRR Abdomen: soft, NT Extremities: no edema  Labs reviewed.   Resolved problem list  Shock  Malpositioned ETT  AKI  Hypernatremia Hypotension Mild Shock Liver Assessment and Plan  OOH CA- likely from electrolyte abnormalities Frequent VT Chronic HFrEF, baseline EF 35-40%> now 20-25% -holding PTA GDMT and antihypertensives  -cont lidocaine  -monitor electrolytes -tele monitoring -consider LHC if neuro recovery -given agitation (per report) when sedation was lower, will decrease fentanyl  200 -> 50, and plan on performing SAT/SBT - preferably with MD at bedside to determine whether extubation trial is appropriate even if he is  not sufficiently calm to complete an SBT   Covid, not likely covid pneumonia -airborne precautions  Acute respiratory failure with hypoxia Likely aspiration pneumonia vs pneumonitis -con't zosyn  -LTVV -VAP prevention protocol -PAD protocol- goal is wake up today- d/w neuro -daily SAT & SBT as appropriate; currently mental status precludes    Concern for anoxic brain injury Concern for myoclonus Acute encephalopathy -keppra  -wean sedation -neuroprotective measures-- treat fevers, avoid major electrolyte swings, avoid hypocapnia, hypoxia, hypoglycemia   Immunocompromised, previous CAR-t for MM -con't vericlude, acyclovir , Bactrim  MWF, entecavir  -broad spectrum antibiotics- zosyn  -plans for IVIG as OP on hold  Diarrhea due to rotavirus, norovirus, and E coli> diarrhea resolved PTA -zosyn  -enteric precautions -maintain adequate volume status  Hypoglycemia -con't TF -no insulin ordered  At risk for malnutrition -TF, titrate up to goal  No heparin / pork products due to religious preference  Wife updated at bedside by Dr. Gretta on 1/15 with discussion content being as follows: Most of his care is supportive until he wakes up; she understands the risk of neurologic injury and the possibility that he does not wake up or may wake up with deficits.  We did not discuss long-term treatment options for if he does not wake up yet.   GI: pepcid  (QT WNL) DVT: fondaparinux      Labs   CBC: Recent Labs  Lab 02/10/24 1809 02/10/24 1922 02/10/24 2009 02/10/24 2314 02/11/24 0937 02/12/24 0327 02/13/24 0222  WBC 8.8  --   --  4.0 6.3 4.8 3.5*  HGB 11.1*   < > 11.6* 10.9* 11.6* 11.0* 10.2*  HCT 33.7*   < > 34.0* 32.3* 33.3* 32.2* 30.4*  MCV 98.3  --   --  94.4 94.6 95.3 96.2  PLT 177  --   --  137* 158 146* 144*   < > = values in this interval not displayed.    Basic Metabolic Panel: Recent Labs  Lab 02/10/24 2314 02/11/24 0937 02/11/24 1630 02/12/24 0327 02/13/24 0222  NA 143 146* 146* 144 142  K 3.4* 3.7 3.9 3.9 3.6  CL 110 111 113* 112* 112*  CO2 19* 23 23 22  21*  GLUCOSE 106* 96 88 94 101*  BUN 16 19 20 19 19   CREATININE 1.39* 1.05 1.05 1.09 1.05  CALCIUM  8.8* 8.8* 8.5* 8.6* 8.8*  MG 2.3 2.1 2.0 2.2 2.2  PHOS 3.1 3.4 2.7 2.6 3.2   GFR: Estimated Creatinine Clearance: 106 mL/min (by C-G formula based  on SCr of 1.05 mg/dL). Recent Labs  Lab 02/10/24 1921 02/10/24 2110 02/10/24 2314 02/11/24 0937 02/12/24 0327 02/13/24 0222  WBC  --   --  4.0 6.3 4.8 3.5*  LATICACIDVEN 4.8* 2.4*  --   --   --   --      Total critical care time spent by me: 40 minutes   Critical care time was exclusive of separately billable procedures and the treatment of any other patient.   Critical care was necessary to treat or prevent imminent or life-threatening deterioration.  Critical care was time spent personally by me on the following activities: development of treatment plan with patient and/or surrogate as well as nursing, discussions with consultants, re-evaluation of the patient's condition and their response to treatment, examination of patient, obtaining history from patient or surrogate, ordering and performing treatments and interventions, ordering and review of laboratory studies, ordering and review of radiographic studies, and participation in multidisciplinary rounds.  Lamar Dales, MD Pulmonary, Critical Care & Sleep Medicine [facility]  7a-7p: For contact information, see AMION. If no response to pager, please call PCCM 2-H APP. After 7p: Please call PCCM APP on-call for 2-H.      "

## 2024-02-13 NOTE — Progress Notes (Signed)
" °   02/13/24 2004  Daily Weaning Assessment  Daily Assessment of Readiness to Wean (S)  Wean protocol criteria met (SBT performed)  SBT Method (S)  CPAP 5 cm H20 and PS 5 cm H20  Weaning Start Time (S)  2004    "

## 2024-02-13 NOTE — Progress Notes (Signed)
 "    Advanced Heart Failure Rounding Note  Cardiologist: None  [facility] Cardiologist: Dr. Cherrie Chief Complaint: Cardiac Arrest Patient Profile   Bradley Hunt is a 54 y.o. 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  Significant events:   1/13: OOH cardiac arrest w/o bystander CPR. Shockable rhythm by EMS, intubated. ?Seizure  Subjective:    Remains intubated. On fentanyl /propofol   Will follow simple commands at times  On lidocaine  gtt for NSVT    Objective:    Weight Range: 109.8 kg Body mass index is 31.08 kg/m.   Vital Signs:   Temp:  [97.2 F (36.2 C)-99.9 F (37.7 C)] 99 F (37.2 C) (01/16 0815) Pulse Rate:  [62-88] 65 (01/16 0815) Resp:  [0-28] 18 (01/16 0815) BP: (84-130)/(50-95) 112/73 (01/16 0815) SpO2:  [99 %-100 %] 100 % (01/16 0815) FiO2 (%):  [40 %] 40 % (01/16 0806) Weight:  [109.8 kg] 109.8 kg (01/16 0130) Last BM Date :  (PTA)  Weight change: Filed Weights   02/11/24 0716 02/12/24 0500 02/13/24 0130  Weight: 110.9 kg 111.1 kg 109.8 kg   Intake/Output:  Intake/Output Summary (Last 24 hours) at 02/13/2024 0835 Last data filed at 02/13/2024 0800 Gross per 24 hour  Intake 3408.56 ml  Output 785 ml  Net 2623.56 ml    Physical Exam   General:  Sedated on vent HEENT: normal + ETT and electrodes Neck: supple. no JVD.  Cor: Regular rate & rhythm. No rubs, gallops or murmurs. Lungs: clear Abdomen: soft, nontender, nondistended.Good bowel sounds. Extremities: no cyanosis, clubbing, rash, edema Neuro: sedated on vent will follow basic commands when awake   Telemetry   SR 60s occasional brief NSVT 4-5 beats + PVCs Personally reviewed  Labs   CBC Recent Labs    02/12/24 0327  02/13/24 0222  WBC 4.8 3.5*  HGB 11.0* 10.2*  HCT 32.2* 30.4*  MCV 95.3 96.2  PLT 146* 144*   Basic Metabolic Panel Recent Labs    98/84/73 0327 02/13/24 0222  NA 144 142  K 3.9 3.6  CL 112* 112*  CO2 22 21*  GLUCOSE 94 101*  BUN 19 19  CREATININE 1.09 1.05  CALCIUM  8.6* 8.8*  MG 2.2 2.2  PHOS 2.6 3.2   Liver Function Tests Recent Labs    02/12/24 0327 02/13/24 0222  AST 37 23  ALT 67* 48*  ALKPHOS 57 52  BILITOT 0.5 0.5  PROT 5.3* 5.2*  ALBUMIN 3.5 3.3*   Recent Labs    02/10/24 1809  LIPASE 86*   ProBNP (last 3 results) Recent Labs    02/10/24 1809  PROBNP 83.3   Medications:    Scheduled Medications:  acyclovir   800 mg Per Tube BID   Chlorhexidine  Gluconate Cloth  6 each Topical Daily   entecavir   0.5 mg Per Tube Daily   famotidine   20 mg Per Tube BID   fondaparinux  (ARIXTRA ) injection  2.5 mg Subcutaneous Q24H   levETIRAcetam   500 mg Intravenous Q12H   mouth rinse  15 mL Mouth Rinse Q2H   sulfamethoxazole -trimethoprim   20 mL Per Tube Q M,W,F    Infusions:  feeding supplement (VITAL 1.5 CAL) 60 mL/hr at 02/13/24 0800   fentaNYL  infusion INTRAVENOUS 50 mcg/hr (02/13/24 0800)   lidocaine  1 mg/min (02/13/24 0800)  norepinephrine  (LEVOPHED ) Adult infusion     piperacillin -tazobactam (ZOSYN )  IV 12.5 mL/hr at 02/13/24 0800   propofol  (DIPRIVAN ) infusion 40 mcg/kg/min (02/13/24 0800)    PRN Medications: acetaminophen  **OR** acetaminophen  (TYLENOL ) oral liquid 160 mg/5 mL **OR** acetaminophen , busPIRone , fentaNYL , ondansetron  (ZOFRAN ) IV, mouth rinse, polyethylene glycol, senna  Assessment/Plan   Cardiac Arrest VT/VF, Acute on chronic HFrEF: Found unresponsive by friend, no bystander CPR. Shockable rhythm on EMS arrival, shock x1. Known EF 35-40%. In the setting of severe electrolytes derangements K 2.8 on admission, due to infectious process/diarrhea. He has norovirus, COVID, and rhinovirus.  - Echo read as 20-25% (I think more like 30-35%) -  briefly required NE for BP, now off - maintain K >4 and Mg >2 - GDMT on hold for now to allow BP to rise for cerebral perfusion - Diurese as needed - Continues with low burden of NSVT. Stop lido. Start po amio  - If has neuro recover will need cath   Acute hypoxic resp failure, Rhinovius, COVID infection, ?CAP - on vent; mgmt per CCM - on zosyn /entecavir  - wean vent as tolerated  Multiple Myeloma with Spinal METS - s/p recent CAR-T and fludarabine /cytoxan  lymphodepletion.  Seizure-like activity, Encephalopathy - post arrest - EEG negative so far  - on keppra  - neuro following - MRI tomorrow - d/w Neuro personally  Hypokalemia, Hypomagnesemia - supp as needed  CRITICAL CARE Performed by: Toribio Fuel  Total critical care time: 35 minutes  -Critical care time was exclusive of separately billable procedures and treating other patients. -Critical care was necessary to treat or prevent imminent or life-threatening deterioration. -Critical care was time spent personally by me on the following activities: development of treatment plan with patient and/or surrogate as well as nursing, discussions with consultants, evaluation of patient's response to treatment, examination of patient, obtaining history from patient or surrogate, ordering and performing treatments and interventions, ordering and review of laboratory studies, ordering and review of radiographic studies, pulse oximetry and re-evaluation of patient's condition.  Length of Stay: 3  Toribio Fuel, MD  02/13/2024, 8:35 AM  Advanced Heart Failure Team Pager [phone] (M-F; 7a - 5p)   Please visit [email or web address]: For overnight coverage please call cardiology fellow first. If fellow not available call Shock/ECMO MD on call.  For ECMO / Mechanical Support (Impella, IABP, LVAD) issues call Shock / ECMO MD on call.     "

## 2024-02-13 NOTE — Progress Notes (Signed)
 Nutrition Follow-up  DOCUMENTATION CODES:   Not applicable  INTERVENTION:   Tube Feeding via OG: Vital 1.5 at 60 ml/hr Pro-Source TF20 60 mL BID Goal Regimen provides 137 g of protein, 2320 kcals, 1094 mL of free water   Pt currently receiving additional lipid calories via Propofol   If  continues without BM, recommend changing prn bowel regimen to scheduled   No Pork Products (religious/pt preference) Protein Source in Pro-Source modular is beef collagen    NUTRITION DIAGNOSIS:   Inadequate oral intake related to acute illness as evidenced by NPO status.  Being addressed via TF   GOAL:   Patient will meet greater than or equal to 90% of their needs  Progressing  MONITOR:   TF tolerance, Vent status, Labs, Weight trends  REASON FOR ASSESSMENT:   Consult, Ventilator Enteral/tube feeding initiation and management  ASSESSMENT:   54 yo male admitted post cardiac arrest, acute respiratory failure-COVID+. Pt with significant diarrhea recently: +Norovirus/Rotavirus/Enteroaggregative E.Coli PCR on 1/06 at [facility]. PTA. PMH includes multiple myeloma with spinal mets, s/p stem cell transplant, chemo and radiation therapy  1/13 Admitted, Cardiac Arrest, COVID +  1/14 Trickle TF initiated 1/15 TF increased to goal   Pt remains on vent support, remains on propofol  and fentanyl  gtt  Vital 1.5 increased to goal rate of 60 ml/hr by MD yesterday, currently tolerating  Weight down to 109.8 kg Admit Wt: 102.5 kg  Net+4L since admission UOP 725 mL in 24 hours, 350 mL today BUN/Creatinine wdl  Pt with diarrhea recently due to viral illness but no BM since admission. Bowel regimen prn  Labs: CBGs 69-138 Potassium 3.6 (wdl) Phosphorus 3.2 (wdl) Magnesium  2.2 (wdl)  Meds: Pepcid  Senna prn Miralax  prn   Diet Order:   Diet Order     None       EDUCATION NEEDS:   Not appropriate for education at this time  Skin:  Skin Assessment: Reviewed RN  Assessment  Last BM:  PTA  Height:   Ht Readings from Last 1 Encounters:  02/10/24 6' 2 (1.88 m)    Weight:   Wt Readings from Last 1 Encounters:  02/13/24 109.8 kg     BMI:  Body mass index is 31.08 kg/m.  Estimated Nutritional Needs:   Kcal:  2200-2400 kcals  Protein:  130-150 g  Fluid:  >/= 2L   Betsey Finger MS, RDN, LDN, CNSC Registered Dietitian 3 Clinical Nutrition RD Inpatient Contact Info in Amion

## 2024-02-13 NOTE — Procedures (Signed)
 Extubation Procedure Note  Patient Details:   Name: Bradley Hunt DOB: 03-20-1970 MRN: [id number]   Airway Documentation:    Vent end date: 02/13/24 Vent end time: 2120   Evaluation  O2 sats: stable throughout Complications: No apparent complications Patient did tolerate procedure well. Bilateral Breath Sounds: (P) Clear, Diminished   Yes  Ashika Apuzzo 02/13/2024, 9:23 PM  Positive Cuff leak, no stridor noted. Pt extubated to [place] 2 L

## 2024-02-13 NOTE — Progress Notes (Signed)
 Interim CCM Progress Note Electrolyte Replacement  K 3.6  P:  Give 40 meq K per tube   Sherlean Sharps AGACNP-BC   [facility] 02/12/2024, 8:41 PM  Call Cell: [phone] if have any emergent needs

## 2024-02-13 NOTE — Progress Notes (Signed)
 PCCM interval progress note:  Pt extubated successfully to [place] overnight.   Leita SAUNDERS Chike Farrington, PA-C

## 2024-02-13 NOTE — Procedures (Addendum)
 Patient Name: Bradley Hunt  MRN: [id number]  Epilepsy Attending: Arlin MALVA Krebs  Referring Physician/Provider: Khaliqdina, Salman, MD  Duration: 02/12/2024 1035 to 02/13/2024 1035   Patient history: 54 year old male status post cardiac arrest. EEG to evaluate for seizure    Level of alertness: comatose   AEDs during EEG study: LEV, propofol    Technical aspects: This EEG study was done with scalp electrodes positioned according to the 10-20 International system of electrode placement. Electrical activity was reviewed with band pass filter of 1-70Hz , sensitivity of 7 uV/mm, display speed of 57mm/sec with a 60Hz  notched filter applied as appropriate. EEG data were recorded continuously and digitally stored.  Video monitoring was available and reviewed as appropriate.   Description: EEG showed continuous generalized 3-6hz  theta-delta slowing admixed with 12-14hz  beta activity. Hyperventilation and photic stimulation were not performed.      ABNORMALITY - Continuous slow, generalized   IMPRESSION: This study is suggestive of generalized cerebral dysfunction (encephalopathy) nonspecific etiology but likely related to sedation. No seizures or epileptiform discharges were seen throughout the recording.   Unique Sillas O Kyzen Horn

## 2024-02-13 NOTE — Procedures (Signed)
 VENTILATOR WEAN NOTE 02/13/2024  Start Mode: PRVC  Wean Mode: Pressure Support  Duration before failure: No wean  Reason for failure: No wean   Notes: No wean this AM. Patient was on continuous EEG, not breathing over set RR.

## 2024-02-13 NOTE — Plan of Care (Signed)
" °  Problem: Cardiac: Goal: Ability to achieve and maintain adequate cardiopulmonary perfusion will improve Outcome: Progressing   Problem: Neurologic: Goal: Promote progressive neurologic recovery Outcome: Progressing   Problem: Respiratory: Goal: Will maintain a patent airway Outcome: Progressing   Problem: Nutrition: Goal: Adequate nutrition will be maintained Outcome: Progressing   "

## 2024-02-13 NOTE — Progress Notes (Signed)
LTM EEG D/C'd. No noted skin breakdown.

## 2024-02-13 NOTE — Progress Notes (Signed)
 NEUROLOGY CONSULT FOLLOW UP NOTE   Date of service: February 13, 2024 Patient Name: Bradley Hunt MRN:  [id number] DOB:  1970/02/10  Interval Hx/subjective   Patient is still on propofol , at 40 and Fentanyl  at 200. While on sedation, he is following commands,  makes eye contact but does not track, breathing over vent, positive reflexes.   Vitals   Vitals:   02/13/24 0400 02/13/24 0500 02/13/24 0600 02/13/24 0700  BP: 110/61 (!) 94/55 103/62   Pulse: 73 62 70   Resp: 18  19   Temp: 98.8 F (37.1 C) 98.6 F (37 C) 98.6 F (37 C)   TempSrc: Esophageal   Esophageal  SpO2: 100% 100% 100%   Weight:      Height:         Body mass index is 31.08 kg/m.  Physical Exam   Constitutional: critically ill intubated on sedation  Cardiovascular: Normal rate and regular rhythm.   Respiratory: on ventilator   Neurologic Examination   Mental Status -  Sedated. Eyes open to pain briefly, squeezes with BL hands on commands and wiggles toes on command.  Cranial Nerves II - XII - Does not track. Makes brief eye contact on the left. PERRL. Corneals intact bilaterally. Face appears symmetric, but ETT in place so hard to fully assess. Positive cough and gag.   Aqueezes with BL hands on commands and wiggles BL toes on command.  Medications Current Medications[1]  Labs and Diagnostic Imaging   CBC:  Recent Labs  Lab 02/12/24 0327 02/13/24 0222  WBC 4.8 3.5*  HGB 11.0* 10.2*  HCT 32.2* 30.4*  MCV 95.3 96.2  PLT 146* 144*    Basic Metabolic Panel:  Lab Results  Component Value Date   NA 142 02/13/2024   K 3.6 02/13/2024   CO2 21 (L) 02/13/2024   GLUCOSE 101 (H) 02/13/2024   BUN 19 02/13/2024   CREATININE 1.05 02/13/2024   CALCIUM  8.8 (L) 02/13/2024   GFRNONAA >60 02/13/2024   GFRAA >60 10/18/2019   Lipid Panel: No results found for: LDLCALC HgbA1c: No results found for: HGBA1C Urine Drug Screen: No results found for: LABOPIA, COCAINSCRNUR, LABBENZ, AMPHETMU,  THCU, LABBARB  Alcohol Level     Component Value Date/Time   ETH <15 02/10/2024 2314   INR  Lab Results  Component Value Date   INR 1.12 07/23/2017   APTT  Lab Results  Component Value Date   APTT 27 07/08/2016   AED levels: No results found for: PHENYTOIN, ZONISAMIDE, LAMOTRIGINE, LEVETIRACETA  CT Head without contrast(Personally reviewed): No acute process  Ceribell EEG 1/14:  - Continuous slow, generalized IMPRESSION: This limited ceribell EEG is suggestive of generalized nonspecific cerebral dysfunction (encephalopathy). No seizures or epileptiform discharges were seen throughout the recording.  LTM EEG 1/15: Continuous slow, generalized. This study is suggestive of generalized cerebral dysfunction (encephalopathy) nonspecific etiology but likely related to sedation. No seizures or epileptiform discharges were seen throughout the recording.  LTM EEG 1/16:  Continuous slow, generalized. This study is suggestive of generalized cerebral dysfunction (encephalopathy) nonspecific etiology but likely related to sedation. No seizures or epileptiform discharges were seen throughout the recording.   Assessment   Bradley Hunt is a 54 y.o. male   with abnormal movements following cardiac arrest.  These movements were captured on rapid limited montage EEG and did not appear to have any ictal correlate.  Overnight 1/14 and 1/15, had some shivering/twitching and was connected to Rapid EEG with no seizure activity noted.No shivering  noted last night. LTM read continues to show no seizures. Patient now following commands while on sedation, improvement in mental status.   Encouraged with his ability to follow commands today. Ordered MRI, can be completed today/tomorrow. Since he is following commands and the EEG has been negative, ok to wean as tolerated but not necessary to push to get the sedation off.   Given he is able to follow commands in all extremities, this is clear  demonstration of preservation of atleast some higher cerebral function. neurologic prognosis is good.  Recommendations  - Follow up with Neuropsych outpatient in a couple months for detailed cognitive testing. - Discontinue LTM EEG - Wean sedation as tolerated - MRI Brain wo contrast. - We will be available as needed. _____________________________________________________________________   Pt seen by Neuro NP/APP with MD. Note/plan to be edited by MD as needed.    Rocky JAYSON Likes, DNP [facility] Please use AMION for contact information & EPIC for messaging.  NEUROHOSPITALIST ADDENDUM Performed a face to face diagnostic evaluation.   I have reviewed the contents of history and physical exam as documented by PA/ARNP/Resident and agree with above documentation.  I have discussed and formulated the above plan as documented. Edits to the note have been made as needed.  Svetlana Bagby, MD [facility] [phone]   If 7pm to 7am, please call on call as listed on AMION.    [1]  Current Facility-Administered Medications:    acetaminophen  (TYLENOL ) tablet 650 mg, 650 mg, Oral, Q4H PRN **OR** acetaminophen  (TYLENOL ) 160 MG/5ML solution 650 mg, 650 mg, Per Tube, Q4H PRN, 650 mg at 02/12/24 0113 **OR** acetaminophen  (TYLENOL ) suppository 650 mg, 650 mg, Rectal, Q4H PRN, Rosan Deward ORN, NP   acyclovir  (ZOVIRAX ) 200 MG/5ML suspension SUSP 800 mg, 800 mg, Per Tube, BID, Bowser, Grace E, NP, 800 mg at 02/12/24 2122   busPIRone  (BUSPAR ) tablet 30 mg, 30 mg, Per Tube, Q8H PRN, Daren Ronnald BRAVO, NP, 30 mg at 02/11/24 2150   Chlorhexidine  Gluconate Cloth 2 % PADS 6 each, 6 each, Topical, Daily, Rosan Deward ORN, NP, 6 each at 02/12/24 1755   entecavir  (BARACLUDE ) tablet 0.5 mg, 0.5 mg, Per Tube, Daily, Gretel Prentice BIRCH, RPH   famotidine  (PEPCID ) tablet 20 mg, 20 mg, Per Tube, BID, Hoffman, Paul W, NP, 20 mg at 02/12/24 2122   feeding supplement (VITAL 1.5 CAL) liquid 1,000 mL,  1,000 mL, Per Tube, Continuous, Gretta Leita SQUIBB, DO, Last Rate: 60 mL/hr at 02/13/24 0600, Infusion Verify at 02/13/24 0600   fentaNYL  (SUBLIMAZE ) bolus via infusion 25-100 mcg, 25-100 mcg, Intravenous, Q15 min PRN, Hoffman, Paul W, NP, 100 mcg at 02/13/24 0100   fentaNYL  in NS (2mcg/ml) infusion-PREMIX, 0-400 mcg/hr, Intravenous, Continuous, Rosan Deward ORN, NP, Last Rate: 20 mL/hr at 02/13/24 0600, 200 mcg/hr at 02/13/24 0600   fondaparinux  (ARIXTRA ) injection 2.5 mg, 2.5 mg, Subcutaneous, Q24H, Rosan Deward ORN, NP, 2.5 mg at 02/12/24 9053   levETIRAcetam  (KEPPRA ) undiluted injection 500 mg, 500 mg, Intravenous, Q12H, Wilson, Tara N, PA-C, 500 mg at 02/12/24 2122   [COMPLETED] lidocaine  (XYLOCAINE ) 4 mg/mL bolus via infusion 100 mg, 100 mg, Intravenous, Once, 100 mg at 02/12/24 1006 **AND** lidocaine  (cardiac) 2000 mg in dextrose  5% 500 mL (4mg /mL) IV infusion, 1 mg/min, Intravenous, Continuous, Gretta Leita SQUIBB, DO, Last Rate: 15 mL/hr at 02/13/24 0600, 1 mg/min at 02/13/24 0600   norepinephrine  (LEVOPHED ) 4mg  in (0.016 mg/mL) premix infusion, 0-10 mcg/min, Intravenous, Titrated, Rosan Deward ORN, NP  ondansetron  (ZOFRAN ) injection 4 mg, 4 mg, Intravenous, Q6H PRN, Hoffman, Paul W, NP   Oral care mouth rinse, 15 mL, Mouth Rinse, Q2H, Rosan Deward ORN, NP, 15 mL at 02/13/24 0745   Oral care mouth rinse, 15 mL, Mouth Rinse, PRN, Rosan Deward ORN, NP   piperacillin -tazobactam (ZOSYN ) IVPB 3.375 g, 3.375 g, Intravenous, Q8H, Salam, Savannah B, RPH, Last Rate: 12.5 mL/hr at 02/13/24 0600, Infusion Verify at 02/13/24 0600   polyethylene glycol (MIRALAX  / GLYCOLAX ) packet 17 g, 17 g, Per Tube, Daily PRN, Bowser, Ronnald BRAVO, NP   propofol  (DIPRIVAN ) 1000 MG/100ML infusion, 0-80 mcg/kg/min, Intravenous, Titrated, Tegeler, Lonni PARAS, MD, Last Rate: 25.7 mL/hr at 02/13/24 0740, 40 mcg/kg/min at 02/13/24 0740   senna (SENOKOT) tablet 8.6 mg, 1 tablet, Per Tube, Daily PRN, Bowser, Ronnald BRAVO, NP    sulfamethoxazole -trimethoprim  (BACTRIM ) 200-40 MG/5ML suspension 20 mL, 20 mL, Per Tube, Q M,W,F, Gretta Leita SQUIBB, DO

## 2024-02-14 ENCOUNTER — Encounter (HOSPITAL_COMMUNITY): Payer: Self-pay | Admitting: Critical Care Medicine

## 2024-02-14 LAB — CULTURE, RESPIRATORY W GRAM STAIN

## 2024-02-14 LAB — PHOSPHORUS: Phosphorus: 4.1 mg/dL (ref 2.5–4.6)

## 2024-02-14 LAB — BASIC METABOLIC PANEL WITH GFR
Anion gap: 14 (ref 5–15)
BUN: 15 mg/dL (ref 6–20)
CO2: 16 mmol/L — ABNORMAL LOW (ref 22–32)
Calcium: 9.2 mg/dL (ref 8.9–10.3)
Chloride: 109 mmol/L (ref 98–111)
Creatinine, Ser: 0.85 mg/dL (ref 0.61–1.24)
GFR, Estimated: >60 mL/min
Glucose, Bld: 104 mg/dL — ABNORMAL HIGH (ref 70–99)
Potassium: 5 mmol/L (ref 3.5–5.1)
Sodium: 140 mmol/L (ref 135–145)

## 2024-02-14 LAB — CBC
HCT: 35.9 % — ABNORMAL LOW (ref 39.0–52.0)
Hemoglobin: 12.4 g/dL — ABNORMAL LOW (ref 13.0–17.0)
MCH: 32.6 pg (ref 26.0–34.0)
MCHC: 34.5 g/dL (ref 30.0–36.0)
MCV: 94.5 fL (ref 80.0–100.0)
Platelets: 173 K/uL (ref 150–400)
RBC: 3.8 MIL/uL — ABNORMAL LOW (ref 4.22–5.81)
RDW: 12.7 % (ref 11.5–15.5)
WBC: 5.4 K/uL (ref 4.0–10.5)
nRBC: 0 % (ref 0.0–0.2)

## 2024-02-14 LAB — GLUCOSE, CAPILLARY
Glucose-Capillary: 114 mg/dL — ABNORMAL HIGH (ref 70–99)
Glucose-Capillary: 108 mg/dL — ABNORMAL HIGH (ref 70–99)

## 2024-02-14 LAB — HEPATIC FUNCTION PANEL
ALT: 43 U/L (ref 0–44)
AST: 27 U/L (ref 15–41)
Albumin: 3.4 g/dL — ABNORMAL LOW (ref 3.5–5.0)
Alkaline Phosphatase: 57 U/L (ref 38–126)
Bilirubin, Direct: 0.1 mg/dL (ref 0.0–0.2)
Indirect Bilirubin: 0.5 mg/dL (ref 0.3–0.9)
Total Bilirubin: 0.6 mg/dL (ref 0.0–1.2)
Total Protein: 5.6 g/dL — ABNORMAL LOW (ref 6.5–8.1)

## 2024-02-14 LAB — TRIGLYCERIDES: Triglycerides: 156 mg/dL — ABNORMAL HIGH

## 2024-02-14 LAB — MAGNESIUM: Magnesium: 2 mg/dL (ref 1.7–2.4)

## 2024-02-14 MED ORDER — SENNA 8.6 MG PO TABS: 1.0000 | ORAL_TABLET | Freq: Every day | ORAL | Status: DC | PRN

## 2024-02-14 MED ORDER — FENTANYL CITRATE (PF) 50 MCG/ML IJ SOSY
25.0000 ug | PREFILLED_SYRINGE | INTRAMUSCULAR | Status: DC | PRN
  Administered 2024-02-14: 25 ug via INTRAVENOUS
  Filled 2024-02-14: qty 1

## 2024-02-14 MED ORDER — LOSARTAN POTASSIUM 25 MG PO TABS: 25.0000 mg | ORAL_TABLET | Freq: Every day | ORAL | Status: DC

## 2024-02-14 MED ORDER — FUROSEMIDE 10 MG/ML IJ SOLN
40.0000 mg | Freq: Once | INTRAMUSCULAR | Status: AC
  Administered 2024-02-14: 40 mg via INTRAVENOUS
  Filled 2024-02-14: qty 4

## 2024-02-14 MED ORDER — POLYETHYLENE GLYCOL 3350 17 G PO PACK: 17.0000 g | PACK | Freq: Every day | ORAL | Status: DC | PRN

## 2024-02-14 MED ORDER — SPIRONOLACTONE 12.5 MG HALF TABLET: 12.5000 mg | ORAL_TABLET | Freq: Every day | ORAL | Status: DC

## 2024-02-14 MED ORDER — HYDROMORPHONE HCL 1 MG/ML IJ SOLN
0.5000 mg | INTRAMUSCULAR | Status: DC | PRN
  Administered 2024-02-14 – 2024-02-15 (×2): 0.5 mg via INTRAVENOUS
  Filled 2024-02-14 (×2): qty 1

## 2024-02-14 MED ORDER — ENTECAVIR 0.5 MG PO TABS
0.5000 mg | ORAL_TABLET | Freq: Every day | ORAL | Status: DC
  Administered 2024-02-15 – 2024-02-19 (×5): 0.5 mg via ORAL
  Filled 2024-02-14 (×5): qty 1

## 2024-02-14 MED ORDER — ACYCLOVIR 200 MG/5ML PO SUSP: 800.0000 mg | Freq: Two times a day (BID) | ORAL | Status: DC

## 2024-02-14 MED ORDER — ACYCLOVIR 200 MG PO CAPS
800.0000 mg | ORAL_CAPSULE | Freq: Two times a day (BID) | ORAL | Status: DC
  Administered 2024-02-14 – 2024-02-19 (×10): 800 mg via ORAL
  Filled 2024-02-14 (×3): qty 4
  Filled 2024-02-14: qty 1
  Filled 2024-02-14 (×8): qty 4

## 2024-02-14 MED ORDER — SULFAMETHOXAZOLE-TRIMETHOPRIM 800-160 MG PO TABS
1.0000 | ORAL_TABLET | ORAL | Status: DC
  Administered 2024-02-16 – 2024-02-18 (×2): 1 via ORAL
  Filled 2024-02-14 (×2): qty 1

## 2024-02-14 NOTE — Plan of Care (Signed)
 PLAN OF CARE NOTE  PCCM pickup  55 year old man with PMH of multiple myeloma (relapsed), HFrEF previously on Entresto , which was stopped during a previous admission for hypotension who presented with an out-of-hospital cardiac arrest.  Resuscitated by EMS intubated and admitted to the ICU on 02/10/2024.  Neurology and cardiology consulted.  Patient extubated on 1/16.  Accepted in transfer to hospitalist service on 1/17.  Patient seen and evaluated at bedside. Wife reports that the patient is still a little confused and tends to repeat himself.  - Continue Zosyn  for Pseudomonas pneumonia (pansensitive). -Will need to discuss with neurology whether the patient should continue on Keppra . -Per cardiology, patient warrants a cath and if clean and they will discuss with EP if he is a candidate for ICD.  MDALA-GAUSI, GOLDEN PILLOW, MD 02/14/2024 1:45 PM

## 2024-02-14 NOTE — Progress Notes (Signed)
 Inpatient Rehab Admissions Coordinator Note:   Per PT patient was screened for CIR candidacy by Khai Arrona SHAUNNA Yvone Cohens, CCC-SLP. At this time, pt is not medically ready for potential CIR admission. Pt with pending cardiology workup (i.e., cath and possible ICD). Will not pursue a rehab consult at this time. CIR admissions team will follow to monitor for medical readiness. A consult order will be placed if pt appears to be an appropriate candidate.   Tinnie Yvone Cohens, MS, CCC-SLP Admissions Coordinator [phone] 02/14/24 4:32 PM

## 2024-02-14 NOTE — Progress Notes (Signed)
 "  NAMEPetar Mucci, MRN:  [id number], DOB:  January 20, 1971, LOS: 4 ADMISSION DATE:  02/10/2024, CONSULTATION DATE:  1/13 REFERRING MD:  Dr. 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  S/p autologous stem cells 2018. HFrEF previously on Entresto , which was stopped during a previous admission for hypotension. Family reports he had been doing well. No complaints at all. In his usual state of health when a friend heard him collapse in the bathroom and called EMS. The patient was unresponsive. Upon EMS arrival the patient was pulseless and CPR was initiated. Shockable rhythm identified and he was shocked x 1. CPR duration a total of 10 mins. LMA was placed in the field and was removed when the patient began to gag. Remained unresponsive in the ED and was intubated for airway protection. Some concern for seizure vs posturing in the ED as well. Neurology and cardiology consulted. Ceribell EEG in ED negative for seizure. CTA chest and head ordered. Low dose norepi required. PCCM asked to admit.   Pertinent  Medical History   has a past medical history of Bone metastases, History of chemotherapy, History of radiation therapy, Multiple myeloma not having achieved remission (HCC) (06/25/2015), and Numbness.   Significant Hospital Events: Including procedures, antibiotic start and stop dates in addition to other pertinent events   1/13 admit following cardiac arrest. Found to be COVID+. 1/16: Extubated. Following some commands.  Interim History / Subjective:  Following all commands. Mental status nearing baseline. Growing sparse PsA in respiratory culture. No sensitivities yet.  Objective    Blood pressure (!) 144/80, pulse (!) 57, temperature 98.7 F (37.1 C), temperature source Axillary, resp. rate (!) 25, height 6' 2 (1.88 m), weight  108.6 kg, SpO2 100%.    Vent Mode: PSV;CPAP FiO2 (%):  [40 %] 40 % Set Rate:  [18 bmp] 18 bmp Vt Set:  [660 mL] 660 mL PEEP:  [5 cmH20] 5 cmH20 Plateau Pressure:  [20 cmH20-21 cmH20] 20 cmH20   Intake/Output Summary (Last 24 hours) at 02/14/2024 1210 Last data filed at 02/14/2024 1044 Gross per 24 hour  Intake 1298.89 ml  Output 1435 ml  Net -136.11 ml   Filed Weights   02/12/24 0500 02/13/24 0130 02/14/24 0600  Weight: 111.1 kg 109.8 kg 108.6 kg    Examination: General: Middle aged male in NAD Neuro: Grossly neurologically intact, moving all extremities, answering questions appropriately. HENT: [place]/AT< eyes anicteric Lungs: Non-labored respirations. Cardiovascular: S1S2, RRR Abdomen: soft, NT Extremities: no edema  Labs reviewed.  K 5.0 HCO3 16 Cr 0.85  Resolved problem list  Shock  Malpositioned ETT  AKI  Hypernatremia Hypotension Mild Shock Liver AHRF Assessment and Plan   OOH CA- likely from electrolyte abnormalities Frequent VT Chronic HFrEF, baseline EF 35-40%> now 20-25% -holding PTA GDMT and antihypertensives  -amiodarone  PO -monitor electrolytes -tele monitoring   PsA Pneumonia -cont Zosyn  -f/u sensitivities  Covid, not likely covid pneumonia -airborne precautions   Immunocompromised, previous CAR-t for MM -con't vericlude, acyclovir , Bactrim  MWF, entecavir  -plans for IVIG as OP on hold  Diarrhea due to rotavirus, norovirus, and E coli> diarrhea resolved PTA -enteric precautions -maintain adequate volume status  Hypoglycemia -con't TF -no insulin ordered  At risk for malnutrition -TF, titrate up to goal  No heparin / pork products due to religious preference   Patient and wife updated at bedside on 1/17.  GI: D/c pepcid  (no longer indicated) DVT: fondaparinux   Labs   CBC: Recent Labs  Lab 02/10/24 2314 02/11/24 0937 02/12/24 0327 02/13/24 0222 02/14/24 1134  WBC 4.0 6.3 4.8 3.5* 5.4  HGB 10.9* 11.6* 11.0* 10.2* 12.4*   HCT 32.3* 33.3* 32.2* 30.4* 35.9*  MCV 94.4 94.6 95.3 96.2 94.5  PLT 137* 158 146* 144* 173    Basic Metabolic Panel: Recent Labs  Lab 02/11/24 0937 02/11/24 1630 02/12/24 0327 02/13/24 0222 02/14/24 0723  NA 146* 146* 144 142 140  K 3.7 3.9 3.9 3.6 5.0  CL 111 113* 112* 112* 109  CO2 23 23 22  21* 16*  GLUCOSE 96 88 94 101* 104*  BUN 19 20 19 19 15   CREATININE 1.05 1.05 1.09 1.05 0.85  CALCIUM  8.8* 8.5* 8.6* 8.8* 9.2  MG 2.1 2.0 2.2 2.2 2.0  PHOS 3.4 2.7 2.6 3.2 4.1   GFR: Estimated Creatinine Clearance: 130.4 mL/min (by C-G formula based on SCr of 0.85 mg/dL). Recent Labs  Lab 02/10/24 1921 02/10/24 2110 02/10/24 2314 02/11/24 0937 02/12/24 0327 02/13/24 0222 02/14/24 1134  WBC  --   --    < > 6.3 4.8 3.5* 5.4  LATICACIDVEN 4.8* 2.4*  --   --   --   --   --    < > = values in this interval not displayed.     Total critical care time spent by me: 35 minutes   Critical care time was exclusive of separately billable procedures and the treatment of any other patient.   Critical care was necessary to treat or prevent imminent or life-threatening deterioration.  Critical care was time spent personally by me on the following activities: development of treatment plan with patient and/or surrogate as well as nursing, discussions with consultants, re-evaluation of the patient's condition and their response to treatment, examination of patient, obtaining history from patient or surrogate, ordering and performing treatments and interventions, ordering and review of laboratory studies, ordering and review of radiographic studies, and participation in multidisciplinary rounds.  Lamar Dales, MD Pulmonary, Critical Care & Sleep Medicine [facility]  7a-7p: For contact information, see AMION. If no response to pager, please call PCCM 2-H APP. After 7p: Please call PCCM APP on-call for 2-H.      "

## 2024-02-14 NOTE — Progress Notes (Addendum)
 "    Advanced Heart Failure Rounding Note  Cardiologist: None  [facility] Cardiologist: Dr. Cherrie Chief Complaint: Cardiac Arrest Patient Profile   Bradley Hunt is a 54 y.o. 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  Significant events:   1/13: OOH cardiac arrest w/o bystander CPR. Shockable rhythm by EMS, intubated. ?Seizure 1/16 Extubated  Subjective:    Extubated overnight  Awake. Alert conversant.   No CP or SOB. Says belly is sore  Objective:    Weight Range: 108.6 kg Body mass index is 30.74 kg/m.   Vital Signs:   Temp:  [96.3 F (35.7 C)-99.1 F (37.3 C)] 98.7 F (37.1 C) (01/17 0700) Pulse Rate:  [50-75] 57 (01/17 0800) Resp:  [10-25] 25 (01/17 0800) BP: (86-152)/(55-116) 144/80 (01/17 0800) SpO2:  [97 %-100 %] 100 % (01/17 0800) FiO2 (%):  [40 %] 40 % (01/16 2004) Weight:  [108.6 kg] 108.6 kg (01/17 0600) Last BM Date :  (PTA)  Weight change: Filed Weights   02/12/24 0500 02/13/24 0130 02/14/24 0600  Weight: 111.1 kg 109.8 kg 108.6 kg   Intake/Output:  Intake/Output Summary (Last 24 hours) at 02/14/2024 1032 Last data filed at 02/14/2024 0800 Gross per 24 hour  Intake 1493.42 ml  Output 1235 ml  Net 258.42 ml    Physical Exam   General:  Sitting up in bed. No resp difficulty HEENT: normal Neck: supple. no JVD.  Cor: Regular rate & rhythm. No rubs, gallops or murmurs. Lungs: clear Abdomen: soft, nontender, nondistended.Good bowel sounds. Extremities: no cyanosis, clubbing, rash, tr edema Neuro: alert & orientedx3, cranial nerves grossly intact. moves all 4 extremities w/o difficulty. Affect pleasant    Telemetry   SR 60-80s occasional PVCs Personally reviewed  Labs    CBC Recent Labs    02/12/24 0327 02/13/24 0222  WBC 4.8 3.5*  HGB 11.0* 10.2*  HCT 32.2* 30.4*  MCV 95.3 96.2  PLT 146* 144*   Basic Metabolic Panel Recent Labs    98/83/73 0222 02/14/24 0723  NA 142 140  K 3.6 5.0  CL 112* 109  CO2 21* 16*  GLUCOSE 101* 104*  BUN 19 15  CREATININE 1.05 0.85  CALCIUM  8.8* 9.2  MG 2.2 2.0  PHOS 3.2 4.1   Liver Function Tests Recent Labs    02/13/24 0222 02/14/24 0723  AST 23 27  ALT 48* 43  ALKPHOS 52 57  BILITOT 0.5 0.6  PROT 5.2* 5.6*  ALBUMIN 3.3* 3.4*   No results for input(s): LIPASE, AMYLASE in the last 72 hours.  ProBNP (last 3 results) Recent Labs    02/10/24 1809  PROBNP 83.3   Medications:    Scheduled Medications:  acyclovir   800 mg Per Tube BID   amiodarone   200 mg Oral BID   Chlorhexidine  Gluconate Cloth  6 each Topical Daily   entecavir   0.5 mg Per Tube Daily   famotidine   20 mg Per Tube BID   feeding supplement (PROSource TF20)  60 mL Per Tube BID   fondaparinux  (ARIXTRA ) injection  2.5 mg Subcutaneous Q24H   furosemide   40 mg Intravenous Once   levETIRAcetam   500 mg Intravenous Q12H   lidocaine   1 patch Transdermal QHS   sulfamethoxazole -trimethoprim   20 mL Per Tube Q M,W,F    Infusions:  dexmedetomidine  (PRECEDEX ) IV infusion Stopped (02/14/24 0540)   feeding supplement (VITAL 1.5 CAL) Stopped (02/13/24 2101)   fentaNYL  infusion INTRAVENOUS Stopped (02/13/24 1923)   norepinephrine  (LEVOPHED ) Adult infusion     piperacillin -tazobactam (ZOSYN )  IV 12.5 mL/hr at 02/14/24 0800   propofol  (DIPRIVAN ) infusion Stopped (02/13/24 1910)    PRN Medications: acetaminophen  **OR** acetaminophen  (TYLENOL ) oral liquid 160 mg/5 mL **OR** acetaminophen , busPIRone , fentaNYL , ondansetron  (ZOFRAN ) IV, polyethylene glycol, senna  Assessment/Plan   Cardiac Arrest VT/VF, Acute on chronic HFrEF: Found unresponsive by friend, no bystander CPR. Shockable rhythm on EMS arrival, shock x1. Known EF 35-40%. In the  setting of severe electrolytes derangements K 2.8 on admission, due to infectious process/diarrhea. He has norovirus, COVID, and rhinovirus.  - Echo read as 20-25% (I think more like 30-35% which is his baseline) - Initial concern for severe anoxic injury but extubated 1/16 and mental status quite clear today - GDMT has been on hold to allow for cerebral perfusion. Will reintroduce slowly (was going to start spiro and losartan  today but K 5.0). Will wait one more day - Give one dose lasix  today - Having PVCs. Continue po amio  - Suspect VT/VF related to CM and electrolyte abnormalities but I think he warrants cath and if clean will discuss with EP if he is candidate for ICD  Acute hypoxic resp failure, Rhinovius, COVID infection, ?CAP - on vent; mgmt per CCM - on zosyn /entecavir  - Extubated 1/16  Multiple Myeloma with Spinal METS - s/p chemo and stem cell transplant 2018 - recent recurrence s/p recent CAR-T and fludarabine /cytoxan  lymphodepletion at [facility]  Hypokalemia, Hypomagnesemia - supp as needed  PVCs - continue amio  Length of Stay: 4  Toribio Fuel, MD  02/14/2024, 10:32 AM  Advanced Heart Failure Team Pager [phone] (M-F; 7a - 5p)   Please visit [email or web address]: For overnight coverage please call cardiology fellow first. If fellow not available call Shock/ECMO MD on call.  For ECMO / Mechanical Support (Impella, IABP, LVAD) issues call Shock / ECMO MD on call.     "

## 2024-02-14 NOTE — Evaluation (Signed)
 Physical Therapy Evaluation Patient Details Name: Bradley Hunt MRN: [id number] DOB: Aug 23, 1970 Today's Date: 02/14/2024  History of Present Illness  The pt is a 54 yo male presenting 1/13 after being found unresponsive by friends at work, CPR by EMS with ROSC after 8 min, unknown total downtime. Intubated on arrival, admission complicated by recurrent posturing episodes vs seizure? (EEG negative) as well as testing positive for Covid, Norovirus, and rotavirus. Extubated 1/16. PMH includes: multiple myeloma, previous bony metastasis, chemo and radiation therapy, and previous pancytopenia.   Clinical Impression  Pt in bed upon arrival of PT, agreeable to evaluation at this time. 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 The pt was able to demo good strength in LE and UE with direct testing, but presents with significant cognitive deficits including initiation, sequencing, awareness, problem solving, memory and attention which impact his ability to mobilize without significant cues. The pt was able to power up to standing with minA, but requires continued support due to posterior lean with static stance and lateral drifting and LOB with gait. Pt will continue to benefit from skilled PT acutely as well as intensive post acute therapies to facilitate return to full level of independence with mobility.   Recommend OT evaluation as well for further cognition assessment.         If plan is discharge home, recommend the following: A lot of help with walking and/or transfers;A lot of help with bathing/dressing/bathroom;Assistance with cooking/housework;Assistance with feeding;Direct supervision/assist for medications management;Direct supervision/assist for financial management;Assist for transportation;Help with stairs or ramp for entrance;Supervision due to cognitive status   Can travel  by private vehicle        Equipment Recommendations Rolling walker (2 wheels);BSC/3in1  Recommendations for Other Services  Rehab consult;OT consult    Functional Status Assessment Patient has had a recent decline in their functional status and demonstrates the ability to make significant improvements in function in a reasonable and predictable amount of time.     Precautions / Restrictions Precautions Precautions: Fall Recall of Precautions/Restrictions: Impaired Restrictions Weight Bearing Restrictions Per Provider Order: No      Mobility  Bed Mobility Overal bed mobility: Needs Assistance Bed Mobility: Supine to Sit     Supine to sit: Min assist, +2 for safety/equipment, HOB elevated, Used rails     General bed mobility comments: pt needing frequent cues for sequencing and initiation, minA to steady    Transfers Overall transfer level: Needs assistance Equipment used: 2 person hand held assist Transfers: Sit to/from Stand, Bed to chair/wheelchair/BSC Sit to Stand: Min assist, +2 safety/equipment   Step pivot transfers: Min assist       General transfer comment: initially minA of 2 to rise, but progressed to minA of 1, intermittent sway and posterior lean. minA with max cues to sequence steps to chair, increased assist to complete pivot to stop pt from sitting on armrest    Ambulation/Gait Ambulation/Gait assistance: Min assist, +2 safety/equipment Gait Distance (Feet): 75 Feet (85ft + 20 ft) Assistive device: 2 person hand held assist Gait Pattern/deviations: Step-through pattern, Decreased stride length, Staggering left, Staggering right, Drifts right/left, Narrow base of support Gait velocity: decreased Gait velocity interpretation: <1.31 ft/sec, indicative of household ambulator   General Gait Details: pt with significant sway and x3 LOB needing minA to recover. at times near-scissoring steps and frequent cues for directions. very poor tolerance  for balance  challenge     Balance Overall balance assessment: Needs assistance Sitting-balance support: No upper extremity supported, Feet supported Sitting balance-Leahy Scale: Fair   Postural control: Posterior lean Standing balance support: Single extremity supported Standing balance-Leahy Scale: Poor Standing balance comment: single UE support and frequent drifting and lateral LOB                             Pertinent Vitals/Pain Pain Assessment Pain Assessment: No/denies pain    Home Living Family/patient expects to be discharged to:: Private residence Living Arrangements: Spouse/significant other Available Help at Discharge: Family;Available 24 hours/day Type of Home: House Home Access: Stairs to enter Entrance Stairs-Rails: Right Entrance Stairs-Number of Steps: 3   Home Layout: One level Home Equipment: None      Prior Function Prior Level of Function : Independent/Modified Independent;Working/employed;Driving             Mobility Comments: independent, no DME, no falls ADLs Comments: independent, 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)     Extremity/Trunk Assessment   Upper Extremity Assessment Upper Extremity Assessment: Defer to OT evaluation (grossly functional to MMT, impaired coordination for functional tasks)    Lower Extremity Assessment Lower Extremity Assessment: Overall WFL for tasks assessed (grossly 4/5 to MMT, but functional use and coordination impacted by cognition)    Cervical / Trunk Assessment Cervical / Trunk Assessment: Normal  Communication   Communication Communication: No apparent difficulties    Cognition Arousal: Alert Behavior During Therapy: WFL for tasks assessed/performed   PT - Cognitive impairments: Orientation, Awareness, Memory, Attention, Initiation, Sequencing, Problem solving, Safety/Judgement   Orientation impairments: Place, Time, Situation                    PT - Cognition Comments: pt oriented to year and month initially, when asked the day of the week or date pt states 27, unable to recall after told correct date. Pt also not aware of arrest, states he is at [facility] due to digestion issues/diarrhea. Pt needing significant cues for initiation of all movements, slowed processing and very poor awareness/problem solving. pt urninating on floor at end of session after discussing use of urinal and when asked why pt responded because you didnt take me to the bathroom Following commands: Impaired Following commands impaired: Follows one step commands inconsistently, Follows one step commands with increased time     Cueing Cueing Techniques: Verbal cues, Gestural cues, Tactile cues     General Comments General comments (skin integrity, edema, etc.): incontinent of urine due to poor awareness/problem solving    Exercises     Assessment/Plan    PT Assessment Patient needs continued PT services  PT Problem List Decreased strength;Decreased range of motion;Decreased activity tolerance;Decreased balance;Decreased mobility;Decreased coordination;Decreased cognition;Decreased knowledge of use of DME;Decreased safety awareness;Decreased knowledge of precautions       PT Treatment Interventions DME instruction;Gait training;Stair training;Functional mobility training;Therapeutic activities;Therapeutic exercise;Balance training;Cognitive remediation;Neuromuscular re-education;Patient/family education    PT Goals (Current goals can be found in the Care Plan section)  Acute Rehab PT Goals Patient Stated Goal: to return to independence PT Goal Formulation: With patient Time For Goal Achievement: 02/28/24 Potential to Achieve Goals: Good    Frequency Min 3X/week        AM-PAC PT 6 Clicks Mobility  Outcome Measure Help needed turning from your back to your side while in a flat bed without  using bedrails?: A Little Help needed moving from lying on  your back to sitting on the side of a flat bed without using bedrails?: A Lot (mod cues) Help needed moving to and from a bed to a chair (including a wheelchair)?: A Lot (mod cues) Help needed standing up from a chair using your arms (e.g., wheelchair or bedside chair)?: A Lot (mod cues) Help needed to walk in hospital room?: A Lot Help needed climbing 3-5 steps with a railing? : Total 6 Click Score: 12    End of Session Equipment Utilized During Treatment: Gait belt Activity Tolerance: Patient tolerated treatment well Patient left: in chair;with call bell/phone within reach;with chair alarm set;with family/visitor present Nurse Communication: Mobility status PT Visit Diagnosis: Unsteadiness on feet (R26.81);Difficulty in walking, not elsewhere classified (R26.2);Ataxic gait (R26.0)    Time: 9094-9049 PT Time Calculation (min) (ACUTE ONLY): 45 min   Charges:   PT Evaluation $PT Eval Moderate Complexity: 1 Mod PT Treatments $Gait Training: 8-22 mins $Therapeutic Activity: 8-22 mins PT General Charges $$ ACUTE PT VISIT: 1 Visit         Izetta Call, PT, DPT   Acute Rehabilitation Department Office [phone] Secure Chat Communication Preferred  Izetta JULIANNA Call 02/14/2024, 11:35 AM

## 2024-02-15 DIAGNOSIS — J151 Pneumonia due to Pseudomonas: Secondary | ICD-10-CM | POA: Diagnosis not present

## 2024-02-15 DIAGNOSIS — I5021 Acute systolic (congestive) heart failure: Secondary | ICD-10-CM | POA: Diagnosis not present

## 2024-02-15 DIAGNOSIS — E876 Hypokalemia: Secondary | ICD-10-CM | POA: Diagnosis not present

## 2024-02-15 DIAGNOSIS — I469 Cardiac arrest, cause unspecified: Secondary | ICD-10-CM | POA: Diagnosis not present

## 2024-02-15 LAB — PHOSPHORUS: Phosphorus: 3.7 mg/dL (ref 2.5–4.6)

## 2024-02-15 LAB — CBC
HCT: 32.2 % — ABNORMAL LOW (ref 39.0–52.0)
Hemoglobin: 11.5 g/dL — ABNORMAL LOW (ref 13.0–17.0)
MCH: 32.6 pg (ref 26.0–34.0)
MCHC: 35.7 g/dL (ref 30.0–36.0)
MCV: 91.2 fL (ref 80.0–100.0)
Platelets: 162 K/uL (ref 150–400)
RBC: 3.53 MIL/uL — ABNORMAL LOW (ref 4.22–5.81)
RDW: 12.2 % (ref 11.5–15.5)
WBC: 3.6 K/uL — ABNORMAL LOW (ref 4.0–10.5)
nRBC: 0 % (ref 0.0–0.2)

## 2024-02-15 LAB — MAGNESIUM: Magnesium: 1.8 mg/dL (ref 1.7–2.4)

## 2024-02-15 LAB — HEPATIC FUNCTION PANEL
ALT: 41 U/L (ref 0–44)
AST: 24 U/L (ref 15–41)
Albumin: 3.7 g/dL (ref 3.5–5.0)
Alkaline Phosphatase: 57 U/L (ref 38–126)
Bilirubin, Direct: 0.2 mg/dL (ref 0.0–0.2)
Indirect Bilirubin: 0.4 mg/dL (ref 0.3–0.9)
Total Bilirubin: 0.6 mg/dL (ref 0.0–1.2)
Total Protein: 5.9 g/dL — ABNORMAL LOW (ref 6.5–8.1)

## 2024-02-15 LAB — BASIC METABOLIC PANEL WITH GFR
Anion gap: 13 (ref 5–15)
BUN: 12 mg/dL (ref 6–20)
CO2: 25 mmol/L (ref 22–32)
Calcium: 9.4 mg/dL (ref 8.9–10.3)
Chloride: 104 mmol/L (ref 98–111)
Creatinine, Ser: 0.92 mg/dL (ref 0.61–1.24)
GFR, Estimated: >60 mL/min
Glucose, Bld: 112 mg/dL — ABNORMAL HIGH (ref 70–99)
Potassium: 3.3 mmol/L — ABNORMAL LOW (ref 3.5–5.1)
Sodium: 142 mmol/L (ref 135–145)

## 2024-02-15 LAB — CULTURE, BLOOD (ROUTINE X 2)
Culture: NO GROWTH
Special Requests: ADEQUATE

## 2024-02-15 MED ORDER — LOSARTAN POTASSIUM 25 MG PO TABS
25.0000 mg | ORAL_TABLET | Freq: Every day | ORAL | Status: DC
  Administered 2024-02-15 – 2024-02-16 (×2): 25 mg via ORAL
  Filled 2024-02-15 (×2): qty 1

## 2024-02-15 MED ORDER — SPIRONOLACTONE 12.5 MG HALF TABLET
12.5000 mg | ORAL_TABLET | Freq: Every day | ORAL | Status: DC
  Administered 2024-02-15 – 2024-02-16 (×2): 12.5 mg via ORAL
  Filled 2024-02-15 (×2): qty 1

## 2024-02-15 MED ORDER — MAGNESIUM SULFATE 4 GM/100ML IV SOLN
4.0000 g | Freq: Once | INTRAVENOUS | Status: AC
  Administered 2024-02-15: 4 g via INTRAVENOUS
  Filled 2024-02-15: qty 100

## 2024-02-15 MED ORDER — MELATONIN 3 MG PO TABS
3.0000 mg | ORAL_TABLET | Freq: Every evening | ORAL | Status: DC | PRN
  Administered 2024-02-15 (×2): 3 mg via ORAL
  Filled 2024-02-15 (×2): qty 1

## 2024-02-15 MED ORDER — POTASSIUM CHLORIDE CRYS ER 20 MEQ PO TBCR
40.0000 meq | EXTENDED_RELEASE_TABLET | Freq: Three times a day (TID) | ORAL | Status: AC
  Administered 2024-02-15 (×2): 40 meq via ORAL
  Filled 2024-02-15 (×2): qty 2

## 2024-02-15 NOTE — Plan of Care (Signed)
" °  Problem: Education: Goal: Ability to manage disease process will improve Outcome: Progressing   Problem: Cardiac: Goal: Ability to achieve and maintain adequate cardiopulmonary perfusion will improve Outcome: Progressing   Problem: Neurologic: Goal: Promote progressive neurologic recovery Outcome: Progressing   Problem: Skin Integrity: Goal: Risk for impaired skin integrity will be minimized. Outcome: Progressing   Problem: Education: Goal: Knowledge of risk factors and measures for prevention of condition will improve Outcome: Progressing   Problem: Coping: Goal: Psychosocial and spiritual needs will be supported Outcome: Progressing   Problem: Respiratory: Goal: Will maintain a patent airway Outcome: Progressing Goal: Complications related to the disease process, condition or treatment will be avoided or minimized Outcome: Progressing   Problem: Education: Goal: Knowledge of General Education information will improve Description: Including pain rating scale, medication(s)/side effects and non-pharmacologic comfort measures Outcome: Progressing   Problem: Health Behavior/Discharge Planning: Goal: Ability to manage health-related needs will improve Outcome: Progressing   Problem: Clinical Measurements: Goal: Ability to maintain clinical measurements within normal limits will improve Outcome: Progressing Goal: Will remain free from infection Outcome: Progressing Goal: Diagnostic test results will improve Outcome: Progressing Goal: Respiratory complications will improve Outcome: Progressing Goal: Cardiovascular complication will be avoided Outcome: Progressing   Problem: Activity: Goal: Risk for activity intolerance will decrease Outcome: Progressing   Problem: Nutrition: Goal: Adequate nutrition will be maintained Outcome: Progressing   Problem: Elimination: Goal: Will not experience complications related to bowel motility Outcome: Progressing Goal: Will  not experience complications related to urinary retention Outcome: Progressing   Problem: Pain Managment: Goal: General experience of comfort will improve and/or be controlled Outcome: Progressing   Problem: Safety: Goal: Ability to remain free from injury will improve Outcome: Progressing   Problem: Skin Integrity: Goal: Risk for impaired skin integrity will decrease Outcome: Progressing   "

## 2024-02-15 NOTE — Progress Notes (Signed)
 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  S/p autologous stem cells 2018. HFrEF previously on Entresto , which was stopped during a previous admission for hypotension. Family reports he had been doing well. No complaints at all. In his usual state of health when a friend heard him collapse in the bathroom and called EMS. The patient was unresponsive. Upon EMS arrival the patient was pulseless and CPR was initiated. Shockable rhythm identified and he was shocked x 1. CPR duration a total of 10 mins. LMA was placed in the field and was removed when the patient began to gag. Remained unresponsive in the ED and was intubated for airway protection.  He was admitted to the ICU.  He was stabilized and then transferred to the floor.  Consultants: Critical care medicine.  Cardiology.  Neurology.  Procedures: EEG    Subjective/Interval History: Patient somewhat distracted this morning.  Apparently has had some confusion for the past several days.  Wife is at the bedside.  He does not have any chest pain or shortness of breath this morning.    Assessment/Plan:  Cardiac arrest/ventricular tachycardia/chronic systolic CHF Cardiac arrest thought to be due to electrolyte abnormalities.  His potassium was 2.8 with admission.  Infectious etiology also thought to be contributing.  Apparently had norovirus COVID-19 and rhinovirus. Echocardiogram shows EF between 20 to 35%. Cardiology is following.  GDMT is on hold to allow for cerebral perfusion. Patient noted to be on amiodarone .  Received 1 dose of furosemide  yesterday. Await further cardiology input today.  Pseudomonas pneumonia/acute respiratory failure with hypoxia Noted to be  on Zosyn .  Sensitivities reviewed.  Noted to be pansensitive.  Plan for 7-day course. Staph epidermidis in the blood is most likely a contaminant.  COVID-19 Noted to be incidentally positive.  No clear evidence for active infection currently.  History of multiple myeloma Followed by medical oncology.  He is status post CAR-T infusion at [facility] in 2025.  Followed by Dr. Lonn.  Concern for seizure activity Was on Keppra  at that time for prophylaxis against neurotoxicity.  No longer on Keppra . There was some concern for seizure activity at the time of admission.  Evaluated by neurology.  Underwent EEG.  Neurology does not feel that he needs to be on antiepileptics.  Will discontinue Keppra .  Acute diarrhea According to CCM notes he has norovirus and rotavirus.  Diarrhea appears to have subsided.  No positive test noted in our EMR.  Hypokalemia Will be supplemented.  Magnesium  is 1.8 and will be supplemented.  Normocytic anemia Stable hemoglobin.  No evidence of overt blood loss    DVT Prophylaxis: No heparin  products due to religion.  Noted to be on Arixtra . Code Status: Full code Family Communication: Discussed with patient and his wife Disposition Plan: Inpatient rehabilitation is being considered    Medications: Scheduled:  acyclovir   800 mg Oral BID   amiodarone   200 mg Oral BID   Chlorhexidine  Gluconate Cloth  6 each Topical Daily   entecavir   0.5 mg Oral Daily   fondaparinux  (ARIXTRA ) injection  2.5 mg Subcutaneous Q24H   levETIRAcetam   500 mg Intravenous Q12H   lidocaine   1 patch Transdermal QHS   [START ON 02/16/2024] sulfamethoxazole -trimethoprim   1 tablet Oral Once per day on Monday Wednesday  Friday   Continuous:  piperacillin -tazobactam (ZOSYN )  IV 3.375 g (02/15/24 0317)   PRN:acetaminophen  **OR** acetaminophen  (TYLENOL ) oral liquid 160 mg/5 mL **OR** acetaminophen , HYDROmorphone  (DILAUDID ) injection, melatonin, ondansetron  (ZOFRAN ) IV, polyethylene  glycol, senna  Antibiotics: Anti-infectives (From admission, onward)    Start     Dose/Rate Route Frequency Ordered Stop   02/16/24 0900  sulfamethoxazole -trimethoprim  (BACTRIM  DS) 800-160 MG per tablet 1 tablet        1 tablet Oral Once per day on Monday Wednesday Friday 02/14/24 1241     02/15/24 1000  entecavir  (BARACLUDE ) tablet 0.5 mg        0.5 mg Oral Daily 02/14/24 1241     02/14/24 2200  acyclovir  (ZOVIRAX ) 200 MG/5ML suspension SUSP 800 mg  Status:  Discontinued        800 mg Oral 2 times daily 02/14/24 1241 02/14/24 1241   02/14/24 2200  acyclovir  (ZOVIRAX ) 200 MG capsule 800 mg        800 mg Oral 2 times daily 02/14/24 1242     02/13/24 1500  entecavir  (BARACLUDE ) tablet 0.5 mg  Status:  Discontinued        0.5 mg Per Tube Daily 02/12/24 1126 02/14/24 1241   02/13/24 1000  sulfamethoxazole -trimethoprim  (BACTRIM ) 200-40 MG/5ML suspension 20 mL  Status:  Discontinued        20 mL Per Tube Every M-W-F 02/11/24 1242 02/14/24 1241   02/11/24 1100  acyclovir  (ZOVIRAX ) 200 MG/5ML suspension SUSP 800 mg  Status:  Discontinued        800 mg Per Tube 2 times daily 02/11/24 1006 02/14/24 1241   02/11/24 1100  sulfamethoxazole -trimethoprim  (BACTRIM ) 200-40 MG/5ML suspension 20 mL  Status:  Discontinued        20 mL Per Tube Daily 02/11/24 1006 02/11/24 1242   02/11/24 1100  entecavir  (BARACLUDE ) tablet 0.5 mg  Status:  Discontinued        0.5 mg Per Tube Every 24 hours 02/11/24 1006 02/12/24 1126   02/11/24 0945  piperacillin -tazobactam (ZOSYN ) IVPB 3.375 g        3.375 g 12.5 mL/hr over 240 Minutes Intravenous Every 8 hours 02/11/24 0942     02/10/24 2115  cefTRIAXone  (ROCEPHIN ) 2 g in sodium chloride  0.9 % 100 mL IVPB  Status:  Discontinued        2 g 200 mL/hr over 30 Minutes Intravenous Every 24 hours 02/10/24 2109 02/11/24 0942       Objective:  Vital Signs  Vitals:   02/14/24 2000 02/15/24 0028 02/15/24 0449 02/15/24 0949  BP: (!) 153/94 (!) 152/88 131/89 (!) 146/96   Pulse: 65 68 80 73  Resp: 16 18 18 18   Temp: 98.5 F (36.9 C) 98.2 F (36.8 C) 98.1 F (36.7 C) 98 F (36.7 C)  TempSrc: Oral Oral Oral Oral  SpO2: 100% 100% 99% 99%  Weight:   102.7 kg   Height:        Intake/Output Summary (Last 24 hours) at 02/15/2024 1046 Last data filed at 02/14/2024 1400 Gross per 24 hour  Intake 55.22 ml  Output 2500 ml  Net -2444.78 ml   Filed Weights   02/13/24 0130 02/14/24 0600 02/15/24 0449  Weight: 109.8 kg 108.6 kg 102.7 kg    General appearance: Awake alert.  In no distress.  Distracted Resp: Clear to auscultation bilaterally.  Normal effort Cardio: S1-S2 is normal regular.  No S3-S4.  No rubs murmurs or bruit GI: Abdomen is soft.  Nontender nondistended.  Bowel  sounds are present normal.  No masses organomegaly Extremities: No edema.  Full range of motion of lower extremities. Neurologic: Alert and oriented x3.  No focal neurological deficits.    Lab Results:  Data Reviewed: I have personally reviewed following labs and reports of the imaging studies  CBC: Recent Labs  Lab 02/11/24 0937 02/12/24 0327 02/13/24 0222 02/14/24 1134 02/15/24 0319  WBC 6.3 4.8 3.5* 5.4 3.6*  HGB 11.6* 11.0* 10.2* 12.4* 11.5*  HCT 33.3* 32.2* 30.4* 35.9* 32.2*  MCV 94.6 95.3 96.2 94.5 91.2  PLT 158 146* 144* 173 162    Basic Metabolic Panel: Recent Labs  Lab 02/11/24 1630 02/12/24 0327 02/13/24 0222 02/14/24 0723 02/15/24 0319  NA 146* 144 142 140 142  K 3.9 3.9 3.6 5.0 3.3*  CL 113* 112* 112* 109 104  CO2 23 22 21* 16* 25  GLUCOSE 88 94 101* 104* 112*  BUN 20 19 19 15 12   CREATININE 1.05 1.09 1.05 0.85 0.92  CALCIUM  8.5* 8.6* 8.8* 9.2 9.4  MG 2.0 2.2 2.2 2.0 1.8  PHOS 2.7 2.6 3.2 4.1 3.7    GFR: Estimated Creatinine Clearance: 117.4 mL/min (by C-G formula based on SCr of 0.92 mg/dL).  Liver Function Tests: Recent Labs  Lab 02/11/24 0937 02/12/24 0327 02/13/24 0222 02/14/24 0723 02/15/24 0319  AST 50* 37 23 27 24   ALT 81*  67* 48* 43 41  ALKPHOS 65 57 52 57 57  BILITOT 0.4 0.5 0.5 0.6 0.6  PROT 6.0* 5.3* 5.2* 5.6* 5.9*  ALBUMIN 4.0 3.5 3.3* 3.4* 3.7    Recent Labs  Lab 02/10/24 1809  LIPASE 86*   BNP (last 3 results) Recent Labs    02/10/24 1809  PROBNP 83.3   CBG: Recent Labs  Lab 02/13/24 1516 02/13/24 1927 02/13/24 2322 02/14/24 0307 02/14/24 0729  GLUCAP 80 115* 102* 114* 108*    Lipid Profile: Recent Labs    02/14/24 0723  TRIG 156*    Recent Results (from the past 240 hours)  Resp panel by RT-PCR (RSV, Flu A&B, Covid) Anterior Nasal Swab     Status: Abnormal   Collection Time: 02/10/24  6:29 PM   Specimen: Anterior Nasal Swab  Result Value Ref Range Status   SARS Coronavirus 2 by RT PCR POSITIVE (A) NEGATIVE Final   Influenza A by PCR NEGATIVE NEGATIVE Final   Influenza B by PCR NEGATIVE NEGATIVE Final    Comment: (NOTE) The Xpert Xpress SARS-CoV-2/FLU/RSV plus assay is intended as an aid in the diagnosis of influenza from Nasopharyngeal swab specimens and should not be used as a sole basis for treatment. Nasal washings and aspirates are unacceptable for Xpert Xpress SARS-CoV-2/FLU/RSV testing.  Fact Sheet for Patients: bloggercourse.com  Fact Sheet for Healthcare Providers: seriousbroker.it  This test is not yet approved or cleared by the [place]  FDA and has been authorized for detection and/or diagnosis of SARS-CoV-2 by FDA under an Emergency Use Authorization (EUA). This EUA will remain in effect (meaning this test can be used) for the duration of the COVID-19 declaration under Section 564(b)(1) of the Act, 21 U.S.C. section 360bbb-3(b)(1), unless the authorization is terminated or revoked.     Resp Syncytial Virus by PCR NEGATIVE NEGATIVE Final    Comment: (NOTE) Fact Sheet for Patients: bloggercourse.com  Fact Sheet for Healthcare  Providers: seriousbroker.it  This test is not yet approved or cleared by the [place]  FDA and has been authorized for detection and/or diagnosis of SARS-CoV-2 by FDA under an  Emergency Use Authorization (EUA). This EUA will remain in effect (meaning this test can be used) for the duration of the COVID-19 declaration under Section 564(b)(1) of the Act, 21 U.S.C. section 360bbb-3(b)(1), unless the authorization is terminated or revoked.  Performed at [facility], [address], [place], [place] [address]   Culture, Respiratory w Gram Stain     Status: None   Collection Time: 02/10/24  9:10 PM   Specimen: Tracheal Aspirate; Respiratory  Result Value Ref Range Status   Specimen Description TRACHEAL ASPIRATE  Final   Special Requests NONE  Final   Gram Stain   Final    FEW WBC PRESENT, PREDOMINANTLY PMN NO ORGANISMS SEEN Performed at [facility], [address] [place], [place] [address]    Culture FEW PSEUDOMONAS AERUGINOSA  Final   Report Status 02/14/2024 FINAL  Final   Organism ID, Bacteria PSEUDOMONAS AERUGINOSA  Final      Susceptibility   Pseudomonas aeruginosa - MIC*    MEROPENEM 1 SENSITIVE Sensitive     CIPROFLOXACIN 0.25 SENSITIVE Sensitive     IMIPENEM 2 SENSITIVE Sensitive     PIP/TAZO Value in next row Sensitive      8 SENSITIVEThis is a modified FDA-approved test that has been validated and its performance characteristics determined by the reporting laboratory.  This laboratory is certified under the Clinical Laboratory Improvement Amendments CLIA as qualified to perform high complexity clinical laboratory testing.    CEFEPIME Value in next row Sensitive      8 SENSITIVEThis is a modified FDA-approved test that has been validated and its performance characteristics determined by the reporting laboratory.  This laboratory is certified under the Clinical Laboratory Improvement Amendments CLIA as qualified to perform high  complexity clinical laboratory testing.    CEFTAZIDIME/AVIBACTAM Value in next row Sensitive      8 SENSITIVEThis is a modified FDA-approved test that has been validated and its performance characteristics determined by the reporting laboratory.  This laboratory is certified under the Clinical Laboratory Improvement Amendments CLIA as qualified to perform high complexity clinical laboratory testing.    CEFTOLOZANE/TAZOBACTAM Value in next row Sensitive      8 SENSITIVEThis is a modified FDA-approved test that has been validated and its performance characteristics determined by the reporting laboratory.  This laboratory is certified under the Clinical Laboratory Improvement Amendments CLIA as qualified to perform high complexity clinical laboratory testing.    TOBRAMYCIN Value in next row Sensitive      8 SENSITIVEThis is a modified FDA-approved test that has been validated and its performance characteristics determined by the reporting laboratory.  This laboratory is certified under the Clinical Laboratory Improvement Amendments CLIA as qualified to perform high complexity clinical laboratory testing.    CEFTAZIDIME Value in next row Sensitive      8 SENSITIVEThis is a modified FDA-approved test that has been validated and its performance characteristics determined by the reporting laboratory.  This laboratory is certified under the Clinical Laboratory Improvement Amendments CLIA as qualified to perform high complexity clinical laboratory testing.    * FEW PSEUDOMONAS AERUGINOSA  MRSA Next Gen by PCR, Nasal     Status: None   Collection Time: 02/10/24 10:23 PM   Specimen: Nasal Mucosa; Nasal Swab  Result Value Ref Range Status   MRSA by PCR Next Gen NOT DETECTED NOT DETECTED Final    Comment: (NOTE) The GeneXpert MRSA Assay (FDA approved for NASAL specimens only), is one component of a comprehensive MRSA  colonization surveillance program. It is not intended to diagnose MRSA infection nor to  guide or monitor treatment for MRSA infections. Test performance is not FDA approved in patients less than 42 years old. Performed at [facility], [address] [place], [place] [address]   Culture, blood (Routine X 2) w Reflex to ID Panel     Status: None   Collection Time: 02/10/24 11:14 PM   Specimen: BLOOD LEFT ARM  Result Value Ref Range Status   Specimen Description BLOOD LEFT ARM  Final   Special Requests   Final    BOTTLES DRAWN AEROBIC AND ANAEROBIC Blood Culture adequate volume   Culture   Final    NO GROWTH 5 DAYS Performed at [facility], [address], [place], [place] [address]    Report Status 02/15/2024 FINAL  Final  Culture, blood (Routine X 2) w Reflex to ID Panel     Status: Abnormal   Collection Time: 02/10/24 11:25 PM   Specimen: BLOOD LEFT HAND  Result Value Ref Range Status   Specimen Description BLOOD LEFT HAND  Final   Special Requests   Final    BOTTLES DRAWN AEROBIC AND ANAEROBIC Blood Culture adequate volume   Culture  Setup Time   Final    GRAM POSITIVE COCCI ANAEROBIC BOTTLE ONLY CRITICAL RESULT CALLED TO, READ BACK BY AND VERIFIED WITH: PHARMD E. REOME [id number] @ 1939 FH    Culture (A)  Final    STAPHYLOCOCCUS EPIDERMIDIS THE SIGNIFICANCE OF ISOLATING THIS ORGANISM FROM A SINGLE SET OF BLOOD CULTURES WHEN MULTIPLE SETS ARE DRAWN IS UNCERTAIN. PLEASE NOTIFY THE MICROBIOLOGY DEPARTMENT WITHIN ONE WEEK IF SPECIATION AND SENSITIVITIES ARE REQUIRED. Performed at [facility], [address], [place], [place] [address]    Report Status 02/13/2024 FINAL  Final  Blood Culture ID Panel (Reflexed)     Status: Abnormal   Collection Time: 02/10/24 11:25 PM  Result Value Ref Range Status   Enterococcus faecalis NOT DETECTED NOT DETECTED Final   Enterococcus Faecium NOT DETECTED NOT DETECTED Final   Listeria monocytogenes NOT DETECTED NOT DETECTED Final   Staphylococcus species DETECTED (A) NOT DETECTED Final    Comment: CRITICAL RESULT  CALLED TO, READ BACK BY AND VERIFIED WITH: PHARMD E. REOME [id number] @ 1939 FH    Staphylococcus aureus (BCID) NOT DETECTED NOT DETECTED Final   Staphylococcus epidermidis DETECTED (A) NOT DETECTED Final    Comment: Methicillin (oxacillin) resistant coagulase negative staphylococcus. Possible blood culture contaminant (unless isolated from more than one blood culture draw or clinical case suggests pathogenicity). No antibiotic treatment is indicated for blood  culture contaminants. CRITICAL RESULT CALLED TO, READ BACK BY AND VERIFIED WITH: PHARMD E. REOME [id number] @ 1939 FH    Staphylococcus lugdunensis NOT DETECTED NOT DETECTED Final   Streptococcus species NOT DETECTED NOT DETECTED Final   Streptococcus agalactiae NOT DETECTED NOT DETECTED Final   Streptococcus pneumoniae NOT DETECTED NOT DETECTED Final   Streptococcus pyogenes NOT DETECTED NOT DETECTED Final   A.calcoaceticus-baumannii NOT DETECTED NOT DETECTED Final   Bacteroides fragilis NOT DETECTED NOT DETECTED Final   Enterobacterales NOT DETECTED NOT DETECTED Final   Enterobacter cloacae complex NOT DETECTED NOT DETECTED Final   Escherichia coli NOT DETECTED NOT DETECTED Final   Klebsiella aerogenes NOT DETECTED NOT DETECTED Final   Klebsiella oxytoca NOT DETECTED NOT DETECTED Final   Klebsiella pneumoniae NOT DETECTED NOT DETECTED Final   Proteus species NOT DETECTED NOT DETECTED Final  Salmonella species NOT DETECTED NOT DETECTED Final   Serratia marcescens NOT DETECTED NOT DETECTED Final   Haemophilus influenzae NOT DETECTED NOT DETECTED Final   Neisseria meningitidis NOT DETECTED NOT DETECTED Final   Pseudomonas aeruginosa NOT DETECTED NOT DETECTED Final   Stenotrophomonas maltophilia NOT DETECTED NOT DETECTED Final   Candida albicans NOT DETECTED NOT DETECTED Final   Candida auris NOT DETECTED NOT DETECTED Final   Candida glabrata NOT DETECTED NOT DETECTED Final   Candida krusei NOT DETECTED NOT DETECTED Final   Candida  parapsilosis NOT DETECTED NOT DETECTED Final   Candida tropicalis NOT DETECTED NOT DETECTED Final   Cryptococcus neoformans/gattii NOT DETECTED NOT DETECTED Final   Methicillin resistance mecA/C DETECTED (A) NOT DETECTED Final    Comment: CRITICAL RESULT CALLED TO, READ BACK BY AND VERIFIED WITH: MAYA DRAFTS REOME [id number] @ 1939 FH Performed at [facility], [address], [place], [place] [address]       Radiology Studies: No results found.     LOS: 5 days   Benn Tarver [employer] on [email or web address]  02/15/2024, 10:46 AM   "

## 2024-02-15 NOTE — Progress Notes (Signed)
 "    Advanced Heart Failure Rounding Note  Cardiologist: None  [facility] Cardiologist: Dr. Cherrie Chief Complaint: Cardiac Arrest Patient Profile   Bradley Hunt is a 54 y.o. 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  Significant events:   1/13: OOH cardiac arrest w/o bystander CPR. Shockable rhythm by EMS, intubated. ?Seizure 1/16 Extubated  Subjective:    Remains confused. No CP or SOB   Good diuresis with IV lasix  yesterday  Objective:    Weight Range: 102.7 kg Body mass index is 29.08 kg/m.   Vital Signs:   Temp:  [97.9 F (36.6 C)-98.5 F (36.9 C)] 97.9 F (36.6 C) (01/18 1208) Pulse Rate:  [65-80] 72 (01/18 1208) Resp:  [16-18] 17 (01/18 1208) BP: (131-169)/(88-100) 169/100 (01/18 1208) SpO2:  [97 %-100 %] 97 % (01/18 1208) Weight:  [102.7 kg] 102.7 kg (01/18 0449) Last BM Date : 02/14/24  Weight change: Filed Weights   02/13/24 0130 02/14/24 0600 02/15/24 0449  Weight: 109.8 kg 108.6 kg 102.7 kg   Intake/Output: No intake or output data in the 24 hours ending 02/15/24 1533   Physical Exam   General:  Sitting up in bed. No resp difficulty HEENT: normal Neck: supple. no JVD.  Cor: Regular rate & rhythm. No rubs, gallops or murmurs. Lungs: clear Abdomen: soft, nontender, nondistended.Good bowel sounds. Extremities: no cyanosis, clubbing, rash, edema Neuro: amnestic to all events. Oriented x 1 otherwise non-focal   Telemetry   SR 60-80s occasional PVCs Personally reviewed  Labs   CBC Recent Labs    02/14/24 1134 02/15/24 0319  WBC 5.4 3.6*  HGB 12.4* 11.5*  HCT 35.9* 32.2*  MCV 94.5 91.2  PLT 173 162   Basic Metabolic Panel Recent Labs    98/82/73 0723 02/15/24 0319  NA 140  142  K 5.0 3.3*  CL 109 104  CO2 16* 25  GLUCOSE 104* 112*  BUN 15 12  CREATININE 0.85 0.92  CALCIUM  9.2 9.4  MG 2.0 1.8  PHOS 4.1 3.7   Liver Function Tests Recent Labs    02/14/24 0723 02/15/24 0319  AST 27 24  ALT 43 41  ALKPHOS 57 57  BILITOT 0.6 0.6  PROT 5.6* 5.9*  ALBUMIN 3.4* 3.7   No results for input(s): LIPASE, AMYLASE in the last 72 hours.  ProBNP (last 3 results) Recent Labs    02/10/24 1809  PROBNP 83.3   Medications:    Scheduled Medications:  acyclovir   800 mg Oral BID   amiodarone   200 mg Oral BID   Chlorhexidine  Gluconate Cloth  6 each Topical Daily   entecavir   0.5 mg Oral Daily   fondaparinux  (ARIXTRA ) injection  2.5 mg Subcutaneous Q24H   lidocaine   1 patch Transdermal QHS   potassium chloride   40 mEq Oral TID   [START ON 02/16/2024] sulfamethoxazole -trimethoprim   1 tablet Oral Once per day on Monday Wednesday Friday    Infusions:  piperacillin -tazobactam (ZOSYN )  IV 3.375 g (02/15/24 1142)    PRN Medications: acetaminophen  **OR** acetaminophen  (TYLENOL ) oral liquid 160 mg/5 mL **OR** acetaminophen , HYDROmorphone  (DILAUDID ) injection, melatonin, ondansetron  (ZOFRAN ) IV, polyethylene glycol, senna  Assessment/Plan   Cardiac Arrest VT/VF, Acute on chronic HFrEF: Found unresponsive by friend, no bystander CPR. Shockable rhythm on EMS arrival, shock x1. Known EF 35-40%. In  the setting of severe electrolytes derangements K 2.8 on admission, due to infectious process/diarrhea. He has norovirus, COVID, and rhinovirus.  - Echo read as 20-25% (I think more like 30-35% which is his baseline) - Initial concern for severe anoxic injury but extubated 1/16 and mental status quite clear today - GDMT has been on hold to allow for cerebral perfusion. Will reintroduce slowly Start spiro and losartan  today - Volume improved after IV lasix  - Having PVCs. Continue po amio  - Suspect VT/VF related to CM and electrolyte abnormalities but I think he warrants  cath and if clean will discuss with EP if he is candidate for ICD - Remains confused so unable to consent for cath today. Will d/w family in am   Acute hypoxic resp failure, Rhinovius, COVID infection, ?CAP - on vent; mgmt per CCM - on zosyn /entecavir  - Extubated 1/16  Multiple Myeloma with Spinal METS - s/p chemo and stem cell transplant 2018 - recent recurrence s/p recent CAR-T and fludarabine /cytoxan  lymphodepletion at [facility]  Hypokalemia, Hypomagnesemia - supp as needed - K 3.3 today  Anoxic brain injury - remains confused  PVCs - continue amio  Length of Stay: 5  Toribio Fuel, MD  02/15/2024, 3:33 PM  Advanced Heart Failure Team Pager [phone] (M-F; 7a - 5p)   Please visit [email or web address]: For overnight coverage please call cardiology fellow first. If fellow not available call Shock/ECMO MD on call.  For ECMO / Mechanical Support (Impella, IABP, LVAD) issues call Shock / ECMO MD on call.     "

## 2024-02-15 NOTE — Progress Notes (Signed)
 Pt with 15 beat run of vtach. Pt asymptomatic, reports no SOB, dizziness, palpitations. Notified Joette Pebbles, MD. No new orders at this time. Will continue to monitor pt.

## 2024-02-15 NOTE — Evaluation (Signed)
 Occupational Therapy Evaluation Patient Details Name: Bradley Hunt MRN: [id number] DOB: [birth year] Today's Date: 02/15/2024   History of Present Illness   The pt is a 54 yo male presenting 1/13 after being found unresponsive by friends at work, CPR by EMS with ROSC after 8 min, unknown total downtime. Intubated on arrival, admission complicated by recurrent posturing episodes vs seizure? (EEG negative) as well as testing positive for Covid, Norovirus, and rotavirus. Extubated 1/16. PMH includes: multiple myeloma, previous bony metastasis, chemo and radiation therapy, and previous pancytopenia.     Clinical Impressions PTA, pt lives with wife, typically completely independent and works full time. Pt presents now with deficits in cognition, dynamic standing balance and endurance. Pt able to mobilize with RW and without AD with up to Min A needed for posterior bias with transitional movements. Pt requires Min A for UB ADL and Min-Mod A for LB ADLs. Pt benefits from sequencing cues to complete tasks, noted slow responses for orientation questions and errors when working memory tested. Pt does endorse feeling foggy and would normally be able to answer all questions quickly and accurately. Feel pt would quickly reach an independent level with intensive rehab services > 3 hours therapy per day.  Pt endorsing some dizziness with activity. BP 159/100     If plan is discharge home, recommend the following:   A little help with walking and/or transfers;A little help with bathing/dressing/bathroom;Assistance with cooking/housework;Direct supervision/assist for medications management;Direct supervision/assist for financial management;Assist for transportation;Supervision due to cognitive status     Functional Status Assessment   Patient has had a recent decline in their functional status and demonstrates the ability to make significant improvements in function in a reasonable and predictable amount of  time.     Equipment Recommendations   Other (comment) (TBD)     Recommendations for Other Services   Rehab consult     Precautions/Restrictions   Precautions Precautions: Fall Recall of Precautions/Restrictions: Impaired Precaution/Restrictions Comments: watch BP (HTN) Restrictions Weight Bearing Restrictions Per Provider Order: No     Mobility Bed Mobility               General bed mobility comments: in recliner on entry    Transfers Overall transfer level: Needs assistance Equipment used: Rolling walker (2 wheels), None Transfers: Sit to/from Stand Sit to Stand: Min assist           General transfer comment: Min A to correct posterior LOB upon standing from recliner. CGA to stand at sink pulling on counter top. CGA for sitting back down in recliner d/t posterior bias again      Balance Overall balance assessment: Needs assistance Sitting-balance support: No upper extremity supported, Feet supported Sitting balance-Leahy Scale: Fair   Postural control: Posterior lean Standing balance support: Single extremity supported, Bilateral upper extremity supported, No upper extremity supported Standing balance-Leahy Scale: Poor                             ADL either performed or assessed with clinical judgement   ADL Overall ADL's : Needs assistance/impaired Eating/Feeding: Independent   Grooming: Contact guard assist;Minimal assistance;Standing;Oral care Grooming Details (indicate cue type and reason): assist to place toothpaste on toothbrush. required seated rest break prior to standing and attempting task. Cues needed to rinse out mouth and reach for washcloth to wipe mouth Upper Body Bathing: Minimal assistance   Lower Body Bathing: Minimal assistance;Sit to/from stand;Sitting/lateral leans   Upper Body  Dressing : Minimal assistance;Sitting   Lower Body Dressing: Moderate assistance;Sitting/lateral leans;Sit to/from stand   Toilet  Transfer: Minimal assistance;Ambulation;Rolling walker (2 wheels);Contact guard assist   Toileting- Clothing Manipulation and Hygiene: Minimal assistance;Sit to/from stand;Sitting/lateral lean       Functional mobility during ADLs: Minimal assistance;Contact guard assist;Rolling walker (2 wheels) General ADL Comments: Improved stability with RW in room but also tried without AD. Pt with posterior LOB with act of standing and sitting down in chair.     Vision Ability to See in Adequate Light: 0 Adequate Patient Visual Report: No change from baseline Vision Assessment?: No apparent visual deficits     Perception         Praxis         Pertinent Vitals/Pain Pain Assessment Pain Assessment: No/denies pain     Extremity/Trunk Assessment Upper Extremity Assessment Upper Extremity Assessment: Overall WFL for tasks assessed;Right hand dominant   Lower Extremity Assessment Lower Extremity Assessment: Defer to PT evaluation   Cervical / Trunk Assessment Cervical / Trunk Assessment: Normal   Communication Communication Communication: No apparent difficulties   Cognition Arousal: Alert Behavior During Therapy: Flat affect Cognition: Cognition impaired   Orientation impairments: Time Awareness: Online awareness impaired Memory impairment (select all impairments): Short-term memory, Working civil service fast streamer, Conservation officer, historic buildings Attention impairment (select first level of impairment): Sustained attention Executive functioning impairment (select all impairments): Organization, Sequencing, Problem solving, Reasoning OT - Cognition Comments: pleasant, increased time to answer orientation questions. able to report January, then reports 2016-2015 as year. Able to count backwards from 20 though slow and effortful. Significant difficulty reporting months backwards. Cued to attempt  months in sequential order with x3 cues also needed to correct. Pt does endorse feeling foggy when asked and  would typically be able to get these answers out more quickly                 Following commands: Impaired Following commands impaired: Follows one step commands with increased time     Cueing  General Comments   Cueing Techniques: Verbal cues;Gestural cues;Tactile cues  Wife at bedside. Pt also endorses not sleeping well due to fear of falling asleep and not waking up - RN notified and OT asked if potential ways to assist with sleep   Exercises     Shoulder Instructions      Home Living Family/patient expects to be discharged to:: Private residence Living Arrangements: Spouse/significant other Available Help at Discharge: Family;Available 24 hours/day Type of Home: House Home Access: Stairs to enter [employer] of Steps: 3 Entrance Stairs-Rails: Right Home Layout: One level     Bathroom Shower/Tub: Producer, Television/film/video: Standard     Home Equipment: None          Prior Functioning/Environment Prior Level of Function : Independent/Modified Independent;Working/employed;Driving             Mobility Comments: independent, no DME, no falls ADLs Comments: independent, 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)    OT Problem List: Decreased strength;Decreased activity tolerance;Impaired balance (sitting and/or standing);Decreased cognition;Decreased safety awareness;Decreased knowledge of use of DME or AE   OT Treatment/Interventions: Self-care/ADL training;Therapeutic exercise;Energy conservation;DME and/or AE instruction;Therapeutic activities;Patient/family education;Balance training      OT Goals(Current goals can be found in the care plan section)   Acute Rehab OT Goals Patient Stated Goal: wife hopes pt will be able to get some sleep OT Goal Formulation: With patient/family Time For  Goal Achievement: 02/29/24 Potential to Achieve Goals: Good   OT Frequency:  Min 2X/week     Co-evaluation              AM-PAC OT 6 Clicks Daily Activity     Outcome Measure Help from another person eating meals?: None Help from another person taking care of personal grooming?: A Little Help from another person toileting, which includes using toliet, bedpan, or urinal?: A Little Help from another person bathing (including washing, rinsing, drying)?: A Little Help from another person to put on and taking off regular upper body clothing?: A Little Help from another person to put on and taking off regular lower body clothing?: A Lot 6 Click Score: 18   End of Session Equipment Utilized During Treatment: Gait belt;Rolling walker (2 wheels) Nurse Communication: Mobility status  Activity Tolerance: Patient tolerated treatment well Patient left: in chair;with call bell/phone within reach;with chair alarm set;with family/visitor present  OT Visit Diagnosis: Unsteadiness on feet (R26.81);Other abnormalities of gait and mobility (R26.89);Muscle weakness (generalized) (M62.81)                Time: 9060-8987 OT Time Calculation (min): 33 min Charges:  OT General Charges $OT Visit: 1 Visit OT Evaluation $OT Eval Moderate Complexity: 1 Mod OT Treatments $Self Care/Home Management : 8-22 mins  Mliss NOVAK, OTR/L Acute Rehab Services Office: [phone]   Mliss Fish 02/15/2024, 10:24 AM

## 2024-02-16 DIAGNOSIS — I5021 Acute systolic (congestive) heart failure: Secondary | ICD-10-CM | POA: Diagnosis not present

## 2024-02-16 DIAGNOSIS — E876 Hypokalemia: Secondary | ICD-10-CM | POA: Diagnosis not present

## 2024-02-16 DIAGNOSIS — I469 Cardiac arrest, cause unspecified: Secondary | ICD-10-CM | POA: Diagnosis not present

## 2024-02-16 DIAGNOSIS — J151 Pneumonia due to Pseudomonas: Secondary | ICD-10-CM | POA: Diagnosis not present

## 2024-02-16 LAB — CBC
HCT: 36.2 % — ABNORMAL LOW (ref 39.0–52.0)
Hemoglobin: 12.7 g/dL — ABNORMAL LOW (ref 13.0–17.0)
MCH: 32.2 pg (ref 26.0–34.0)
MCHC: 35.1 g/dL (ref 30.0–36.0)
MCV: 91.6 fL (ref 80.0–100.0)
Platelets: 162 K/uL (ref 150–400)
RBC: 3.95 MIL/uL — ABNORMAL LOW (ref 4.22–5.81)
RDW: 12 % (ref 11.5–15.5)
WBC: 3.4 K/uL — ABNORMAL LOW (ref 4.0–10.5)
nRBC: 0 % (ref 0.0–0.2)

## 2024-02-16 LAB — BASIC METABOLIC PANEL WITH GFR
Anion gap: 10 (ref 5–15)
BUN: 10 mg/dL (ref 6–20)
CO2: 26 mmol/L (ref 22–32)
Calcium: 10 mg/dL (ref 8.9–10.3)
Chloride: 103 mmol/L (ref 98–111)
Creatinine, Ser: 0.97 mg/dL (ref 0.61–1.24)
GFR, Estimated: >60 mL/min
Glucose, Bld: 106 mg/dL — ABNORMAL HIGH (ref 70–99)
Potassium: 4 mmol/L (ref 3.5–5.1)
Sodium: 139 mmol/L (ref 135–145)

## 2024-02-16 LAB — LIDOCAINE LEVEL

## 2024-02-16 LAB — MAGNESIUM: Magnesium: 2.2 mg/dL (ref 1.7–2.4)

## 2024-02-16 MED ORDER — SPIRONOLACTONE 12.5 MG HALF TABLET
12.5000 mg | ORAL_TABLET | Freq: Once | ORAL | Status: AC
  Administered 2024-02-16: 12.5 mg via ORAL
  Filled 2024-02-16: qty 1

## 2024-02-16 MED ORDER — SODIUM CHLORIDE 0.9 % IV SOLN: 250.0000 mL | INTRAVENOUS | Status: DC | PRN

## 2024-02-16 MED ORDER — CARVEDILOL 3.125 MG PO TABS
3.1250 mg | ORAL_TABLET | Freq: Two times a day (BID) | ORAL | Status: DC
  Administered 2024-02-16 – 2024-02-19 (×6): 3.125 mg via ORAL
  Filled 2024-02-16 (×6): qty 1

## 2024-02-16 MED ORDER — SODIUM CHLORIDE 0.9% FLUSH: 3.0000 mL | INTRAVENOUS | Status: DC | PRN

## 2024-02-16 MED ORDER — SPIRONOLACTONE 25 MG PO TABS
25.0000 mg | ORAL_TABLET | Freq: Every day | ORAL | Status: DC
  Administered 2024-02-17 – 2024-02-19 (×3): 25 mg via ORAL
  Filled 2024-02-16 (×3): qty 1

## 2024-02-16 MED ORDER — BOOST / RESOURCE BREEZE PO LIQD CUSTOM: 1.0000 | Freq: Three times a day (TID) | ORAL | Status: DC

## 2024-02-16 MED ORDER — SACUBITRIL-VALSARTAN 49-51 MG PO TABS
1.0000 | ORAL_TABLET | Freq: Two times a day (BID) | ORAL | Status: DC
  Administered 2024-02-16 – 2024-02-19 (×7): 1 via ORAL
  Filled 2024-02-16 (×7): qty 1

## 2024-02-16 MED ORDER — ASPIRIN 81 MG PO CHEW
81.0000 mg | CHEWABLE_TABLET | ORAL | Status: AC
  Administered 2024-02-17: 81 mg via ORAL
  Filled 2024-02-16: qty 1

## 2024-02-16 MED ORDER — SODIUM CHLORIDE 0.9% FLUSH: 3.0000 mL | Freq: Two times a day (BID) | INTRAVENOUS | Status: DC

## 2024-02-16 MED ORDER — FONDAPARINUX SODIUM 2.5 MG/0.5ML ~~LOC~~ SOLN
2.5000 mg | SUBCUTANEOUS | Status: DC
  Administered 2024-02-18 – 2024-02-19 (×2): 2.5 mg via SUBCUTANEOUS
  Filled 2024-02-16 (×2): qty 0.5

## 2024-02-16 NOTE — Progress Notes (Signed)
 "    Advanced Heart Failure Rounding Note  Cardiologist: None  [facility] Cardiologist: Dr. Cherrie Chief Complaint: Cardiac Arrest Patient Profile   Bradley Hunt is a 54 y.o. 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  Significant events:   1/13: OOH cardiac arrest w/o bystander CPR. Shockable rhythm by EMS, intubated. ?Seizure 1/16 Extubated  Subjective:    Remains mildly confused but conversant. Denies CP or SOB.   Objective:    Weight Range: 101.5 kg Body mass index is 28.73 kg/m.   Vital Signs:   Temp:  [97.6 F (36.4 C)-98.6 F (37 C)] 97.9 F (36.6 C) (01/19 0800) Pulse Rate:  [66-88] 66 (01/19 0800) Resp:  [16-18] 16 (01/19 0800) BP: (135-169)/(90-101) 151/90 (01/19 0800) SpO2:  [97 %-100 %] 100 % (01/18 2327) Weight:  [101.5 kg] 101.5 kg (01/19 0405) Last BM Date : 02/15/24  Weight change: Filed Weights   02/14/24 0600 02/15/24 0449 02/16/24 0405  Weight: 108.6 kg 102.7 kg 101.5 kg   Intake/Output:  Intake/Output Summary (Last 24 hours) at 02/16/2024 1144 Last data filed at 02/16/2024 0030 Gross per 24 hour  Intake 480 ml  Output 450 ml  Net 30 ml     Physical Exam   General:  Sitting up in bed. No resp difficulty HEENT: normal Neck: supple. no JVD.  Cor: Regular rate & rhythm. No rubs, gallops or murmurs. Lungs: clear Abdomen: soft, nontender, nondistended.Good bowel sounds. Extremities: no cyanosis, clubbing, rash, edema Neuro: Conversant. Mildly confused.    Telemetry   SR 60-80 occasional PVCs Personally reviewed  Labs   CBC Recent Labs    02/15/24 0319 02/16/24 0807  WBC 3.6* 3.4*  HGB 11.5* 12.7*  HCT 32.2* 36.2*  MCV 91.2 91.6  PLT 162 162   Basic Metabolic  Panel Recent Labs    02/14/24 0723 02/15/24 0319 02/16/24 0807  NA 140 142 139  K 5.0 3.3* 4.0  CL 109 104 103  CO2 16* 25 26  GLUCOSE 104* 112* 106*  BUN 15 12 10   CREATININE 0.85 0.92 0.97  CALCIUM  9.2 9.4 10.0  MG 2.0 1.8 2.2  PHOS 4.1 3.7  --    Liver Function Tests Recent Labs    02/14/24 0723 02/15/24 0319  AST 27 24  ALT 43 41  ALKPHOS 57 57  BILITOT 0.6 0.6  PROT 5.6* 5.9*  ALBUMIN 3.4* 3.7   No results for input(s): LIPASE, AMYLASE in the last 72 hours.  ProBNP (last 3 results) Recent Labs    02/10/24 1809  PROBNP 83.3   Medications:    Scheduled Medications:  acyclovir   800 mg Oral BID   amiodarone   200 mg Oral BID   entecavir   0.5 mg Oral Daily   fondaparinux  (ARIXTRA ) injection  2.5 mg Subcutaneous Q24H   lidocaine   1 patch Transdermal QHS   losartan   25 mg Oral Daily   spironolactone   12.5 mg Oral Once   [START ON 02/17/2024] spironolactone   25 mg Oral Daily   sulfamethoxazole -trimethoprim   1 tablet Oral Once per day on Monday Wednesday Friday    Infusions:  piperacillin -tazobactam (ZOSYN )  IV 3.375 g (02/16/24 0508)    PRN Medications: acetaminophen  **OR** acetaminophen  (TYLENOL ) oral liquid 160 mg/5 mL **OR** acetaminophen , HYDROmorphone  (DILAUDID ) injection, melatonin, ondansetron  (ZOFRAN ) IV, polyethylene  glycol, senna  Assessment/Plan   Cardiac Arrest VT/VF, Acute on chronic HFrEF: Found unresponsive by friend, no bystander CPR. Shockable rhythm on EMS arrival, shock x1. Known EF 35-40%. In the setting of severe electrolytes derangements K 2.8 on admission, due to infectious process/diarrhea. He has norovirus, COVID, and rhinovirus.  - Echo read as 20-25% (I think more like 30-35% which is his baseline) - Initial concern for severe anoxic injury but extubated 1/16 and mental status improving but not back to baseline - Titrate GDMT as tolerated - Volume improved after IV lasix  - Having PVCs. Continue po amio  - Suspect VT/VF  related to CM and electrolyte abnormalities but I think he warrants cath and if clean will discuss with EP if he is candidate for ICD - Discussed possible cath with family. Agreeable to proceed will plan R/L cath tomorrow.  - Will consult EP re: ICD vs Lifevest  Acute hypoxic resp failure, Rhinovius, COVID infection, ?CAP - on vent; mgmt per CCM - on zosyn /entecavir  - Extubated 1/16  Multiple Myeloma with Spinal METS - s/p chemo and stem cell transplant 2018 - recent recurrence s/p recent CAR-T and fludarabine /cytoxan  [facility]odepletion at [facility]  Hypokalemia, Hypomagnesemia - supp as needed - K 4.0 today  Anoxic brain injury - remains confused but improving  PVCs - continue amio  Length of Stay: 6  Toribio Fuel, MD  02/16/2024, 11:44 AM  Advanced Heart Failure Team Pager [phone] (M-F; 7a - 5p)   Please visit [email or web address]: For overnight coverage please call cardiology fellow first. If fellow not available call Shock/ECMO MD on call.  For ECMO / Mechanical Support (Impella, IABP, LVAD) issues call Shock / ECMO MD on call.     "

## 2024-02-16 NOTE — Plan of Care (Signed)
  Problem: Education: Goal: Ability to manage disease process will improve Outcome: Progressing

## 2024-02-16 NOTE — Progress Notes (Signed)
 Physical Therapy Treatment Patient Details Name: Bradley Hunt MRN: [id number] DOB: Dec 14, 1970 Today's Date: 02/16/2024   History of Present Illness The pt is a 54 yo male presenting 1/13 after being found unresponsive by friends at work, CPR by EMS with ROSC after 8 min, unknown total downtime. Intubated on arrival, admission complicated by recurrent posturing episodes vs seizure? (EEG negative) as well as testing positive for Covid, Norovirus, and rotavirus. Extubated 1/16. PMH includes: multiple myeloma, previous bony metastasis, chemo and radiation therapy, and previous pancytopenia.    PT Comments  Pt has made big improvement with mobility since last session though continues to have cognitive deficits limiting his safety. Pt mod I with bed mobiilty. Moves impulsively with bias to R side and no awareness of this. Ambulated in room with frequent turns with no full LOB but hit objects on R side several times. Worked on static and dynamic balance activities in standing. HR 104 bpm with ambulation. Education given on brain injury and brain rest but doubtful that he will retain this yet. Patient will benefit from intensive inpatient follow-up therapy, >3 hours/day. PT will continue to follow.     If plan is discharge home, recommend the following: A lot of help with walking and/or transfers;A lot of help with bathing/dressing/bathroom;Assistance with cooking/housework;Assistance with feeding;Direct supervision/assist for medications management;Direct supervision/assist for financial management;Assist for transportation;Help with stairs or ramp for entrance;Supervision due to cognitive status   Can travel by private vehicle        Equipment Recommendations  None recommended by PT    Recommendations for Other Services Rehab consult     Precautions / Restrictions Precautions Precautions: Fall Recall of Precautions/Restrictions: Impaired Restrictions Weight Bearing Restrictions Per Provider  Order: No     Mobility  Bed Mobility Overal bed mobility: Modified Independent             General bed mobility comments: in recliner on entry    Transfers Overall transfer level: Modified independent Equipment used: None               General transfer comment: pt stands quickly from bed but follows commands to return to sitting and is steady with transfer. Worked on sit>stand from recliner 5x consecutively with no LOB, HR up to 104 bpm, no SOB    Ambulation/Gait Ambulation/Gait assistance: Contact guard assist Gait Distance (Feet): 100 Feet   Gait Pattern/deviations: Step-through pattern, Decreased stride length, Staggering right Gait velocity: decreased Gait velocity interpretation: >2.62 ft/sec, indicative of community ambulatory Pre-gait activities: standing marching General Gait Details: pt with occasional R stagger and bumped things on R side several times with decreased awareness of this but no full LOB with gait, even with frequent turns within room. No posterior bias today   Stairs             Wheelchair Mobility     Tilt Bed    Modified Rankin (Stroke Patients Only)       Balance Overall balance assessment: Needs assistance Sitting-balance support: No upper extremity supported, Feet supported Sitting balance-Leahy Scale: Good     Standing balance support: No upper extremity supported Standing balance-Leahy Scale: Good Standing balance comment: tolerated tossing game with bottle coming in all planes, was able to turn, reach, no LOB Single Leg Stance - Right Leg: 5 Single Leg Stance - Left Leg: 5 Tandem Stance - Right Leg: 10 Tandem Stance - Left Leg: 10  Communication Communication Communication: No apparent difficulties  Cognition Arousal: Alert Behavior During Therapy: WFL for tasks assessed/performed   PT - Cognitive impairments: Orientation, Awareness, Memory, Attention, Initiation, Sequencing, Problem  solving, Safety/Judgement   Orientation impairments: Time, Situation                   PT - Cognition Comments: pt could not recall day/ month/year. Pt with decreased insight into physical and cognitive limitations Following commands: Impaired Following commands impaired: Only follows one step commands consistently    Cueing Cueing Techniques: Verbal cues, Gestural cues  Exercises      General Comments General comments (skin integrity, edema, etc.): pt educated on brain injury and brain rest and was able to teach back was was relayed to him. Questionable whether he will retain this info.      Pertinent Vitals/Pain Pain Assessment Pain Assessment: No/denies pain    Home Living                          Prior Function            PT Goals (current goals can now be found in the care plan section) Acute Rehab PT Goals Patient Stated Goal: to return to independence PT Goal Formulation: With patient Time For Goal Achievement: 02/28/24 Potential to Achieve Goals: Good Progress towards PT goals: Progressing toward goals    Frequency    Min 3X/week      PT Plan      Co-evaluation              AM-PAC PT 6 Clicks Mobility   Outcome Measure  Help needed turning from your back to your side while in a flat bed without using bedrails?: None Help needed moving from lying on your back to sitting on the side of a flat bed without using bedrails?: None Help needed moving to and from a bed to a chair (including a wheelchair)?: A Little Help needed standing up from a chair using your arms (e.g., wheelchair or bedside chair)?: A Little Help needed to walk in hospital room?: A Little Help needed climbing 3-5 steps with a railing? : A Little 6 Click Score: 20    End of Session Equipment Utilized During Treatment: Gait belt Activity Tolerance: Patient tolerated treatment well Patient left: in chair;with call bell/phone within reach Nurse Communication:  Mobility status PT Visit Diagnosis: Unsteadiness on feet (R26.81);Difficulty in walking, not elsewhere classified (R26.2);Ataxic gait (R26.0)     Time: 1101-1130 PT Time Calculation (min) (ACUTE ONLY): 29 min  Charges:    $Gait Training: 8-22 mins $Therapeutic Exercise: 8-22 mins PT General Charges $$ ACUTE PT VISIT: 1 Visit                     Richerd Lipoma, PT  Acute Rehab Services Secure chat preferred Office [phone]    Richerd CROME Carleen Rhue 02/16/2024, 12:45 PM

## 2024-02-16 NOTE — TOC Progression Note (Signed)
 Transition of Care [facility]) - Progression Note    Patient Details  Name: Bradley Hunt MRN: [id number] Date of Birth: 26-Oct-1970  Transition of Care [facility]) CM/SW Contact  Andrez JULIANNA George, RN Phone Number: 02/16/2024, 11:08 AM  Clinical Narrative:     Pt has: norovirus COVID-19 and rhinovirus.  Cardiology considering cath. CIR following for medical readiness.   ICM following for further needs.                     Expected Discharge Plan and Services                                               Social Drivers of Health (SDOH) Interventions SDOH Screenings   Food Insecurity: No Food Insecurity (02/11/2024)  Housing: Low Risk (02/11/2024)  Transportation Needs: No Transportation Needs (02/11/2024)  Utilities: Not At Risk (02/11/2024)  Depression (PHQ2-9): Low Risk (11/04/2023)  Social Connections: Socially Integrated (02/11/2024)  Tobacco Use: Medium Risk (02/14/2024)    Readmission Risk Interventions     No data to display

## 2024-02-16 NOTE — Plan of Care (Signed)
" °  Problem: Education: Goal: Ability to manage disease process will improve Outcome: Progressing   Problem: Cardiac: Goal: Ability to achieve and maintain adequate cardiopulmonary perfusion will improve Outcome: Progressing   Problem: Neurologic: Goal: Promote progressive neurologic recovery Outcome: Progressing   Problem: Skin Integrity: Goal: Risk for impaired skin integrity will be minimized. Outcome: Progressing   Problem: Education: Goal: Knowledge of risk factors and measures for prevention of condition will improve Outcome: Progressing   Problem: Coping: Goal: Psychosocial and spiritual needs will be supported Outcome: Progressing   Problem: Respiratory: Goal: Will maintain a patent airway Outcome: Progressing Goal: Complications related to the disease process, condition or treatment will be avoided or minimized Outcome: Progressing   Problem: Education: Goal: Knowledge of General Education information will improve Description: Including pain rating scale, medication(s)/side effects and non-pharmacologic comfort measures Outcome: Progressing   Problem: Health Behavior/Discharge Planning: Goal: Ability to manage health-related needs will improve Outcome: Progressing   Problem: Clinical Measurements: Goal: Ability to maintain clinical measurements within normal limits will improve Outcome: Progressing Goal: Will remain free from infection Outcome: Progressing Goal: Diagnostic test results will improve Outcome: Progressing Goal: Respiratory complications will improve Outcome: Progressing Goal: Cardiovascular complication will be avoided Outcome: Progressing   Problem: Activity: Goal: Risk for activity intolerance will decrease Outcome: Progressing   Problem: Nutrition: Goal: Adequate nutrition will be maintained Outcome: Progressing   Problem: Coping: Goal: Level of anxiety will decrease Outcome: Progressing   Problem: Elimination: Goal: Will not  experience complications related to bowel motility Outcome: Progressing Goal: Will not experience complications related to urinary retention Outcome: Progressing   Problem: Pain Managment: Goal: General experience of comfort will improve and/or be controlled Outcome: Progressing   Problem: Safety: Goal: Ability to remain free from injury will improve Outcome: Progressing   Problem: Skin Integrity: Goal: Risk for impaired skin integrity will decrease Outcome: Progressing   "

## 2024-02-16 NOTE — Progress Notes (Signed)
 Inpatient Rehab Admissions Coordinator:   Rescreened today and note plans for heart cath tomorrow.  Already mobilizing without physical assist and no device 100'.  He does not meet the need for 3 hrs/day of therapy at this time.  If he has a large decline in function, we can rescreen, but at this time appears he could d/c home with supervision.   Reche Lowers, PT, DPT Admissions Coordinator [phone] 02/16/24 4:32 PM

## 2024-02-16 NOTE — Progress Notes (Signed)
 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  S/p autologous stem cells 2018. HFrEF previously on Entresto , which was stopped during a previous admission for hypotension. Family reports he had been doing well. No complaints at all. In his usual state of health when a friend heard him collapse in the bathroom and called EMS. The patient was unresponsive. Upon EMS arrival the patient was pulseless and CPR was initiated. Shockable rhythm identified and he was shocked x 1. CPR duration a total of 10 mins. LMA was placed in the field and was removed when the patient began to gag. Remained unresponsive in the ED and was intubated for airway protection.  He was admitted to the ICU.  He was stabilized and then transferred to the floor.  Consultants: Critical care medicine.  Cardiology.  Neurology.  Procedures: EEG  Subjective/Interval History: Patient feels well.  Less distracted today compared to yesterday.  Denies any chest pain shortness of breath.  No nausea vomiting.  Wife is at the bedside.     Assessment/Plan:  Cardiac arrest/ventricular tachycardia/chronic systolic CHF Cardiac arrest thought to be due to electrolyte abnormalities.  His potassium was 2.8 with admission.  Infectious etiology also thought to be contributing.  Apparently had norovirus COVID-19 and rhinovirus. Echocardiogram shows EF between 20 to 35%. Cardiology is following.  GDMT is on hold to allow for cerebral perfusion. Patient noted to be on amiodarone .   Cardiology is considering cardiac catheterization. Currently patient is on amiodarone , losartan , spironolactone . Seems to be stable.  Pseudomonas pneumonia/acute respiratory failure with  hypoxia Noted to be on Zosyn .  Sensitivities reviewed.  Noted to be pansensitive.  Plan for 7-day course. Staph epidermidis in the blood is most likely a contaminant. Respiratory status is stable.  Not on oxygen.  COVID-19 Noted to be incidentally positive.  No clear evidence for active infection currently.  History of multiple myeloma Followed by medical oncology.  He is status post CAR-T infusion at [facility] in 2025.  Followed by Dr. Lonn.  Concern for seizure activity Was on Keppra  at that time for prophylaxis against neurotoxicity.  No longer on Keppra . There was some concern for seizure activity at the time of admission.  Evaluated by neurology.  Underwent EEG.  Neurology does not feel that he needs to be on antiepileptics.  Keppra  was discontinued.  Acute diarrhea According to CCM notes he has norovirus and rotavirus.  Diarrhea appears to have subsided.  No positive test noted in our EMR.  Hypokalemia Supplemented.  Magnesium  is 2.2.    Normocytic anemia Stable hemoglobin.  No evidence of overt blood loss    DVT Prophylaxis: No heparin  products due to religion.  Noted to be on Arixtra . Code Status: Full code Family Communication: Discussed with patient and his wife Disposition Plan: Inpatient rehabilitation is being considered    Medications: Scheduled:  acyclovir   800 mg Oral BID   amiodarone   200 mg Oral BID   entecavir   0.5 mg Oral Daily   fondaparinux  (ARIXTRA ) injection  2.5 mg Subcutaneous Q24H   lidocaine   1 patch Transdermal QHS   losartan   25 mg Oral Daily   spironolactone   12.5 mg Oral Once   [START ON 02/17/2024] spironolactone   25 mg Oral Daily   sulfamethoxazole -trimethoprim   1 tablet  Oral Once per day on Monday Wednesday Friday   Continuous:  piperacillin -tazobactam (ZOSYN )  IV 3.375 g (02/16/24 0508)   PRN:acetaminophen  **OR** acetaminophen  (TYLENOL ) oral liquid 160 mg/5 mL **OR** acetaminophen , HYDROmorphone  (DILAUDID ) injection, melatonin,  ondansetron  (ZOFRAN ) IV, polyethylene glycol, senna  Antibiotics: Anti-infectives (From admission, onward)    Start     Dose/Rate Route Frequency Ordered Stop   02/16/24 0900  sulfamethoxazole -trimethoprim  (BACTRIM  DS) 800-160 MG per tablet 1 tablet        1 tablet Oral Once per day on Monday Wednesday Friday 02/14/24 1241     02/15/24 1000  entecavir  (BARACLUDE ) tablet 0.5 mg        0.5 mg Oral Daily 02/14/24 1241     02/14/24 2200  acyclovir  (ZOVIRAX ) 200 MG/5ML suspension SUSP 800 mg  Status:  Discontinued        800 mg Oral 2 times daily 02/14/24 1241 02/14/24 1241   02/14/24 2200  acyclovir  (ZOVIRAX ) 200 MG capsule 800 mg        800 mg Oral 2 times daily 02/14/24 1242     02/13/24 1500  entecavir  (BARACLUDE ) tablet 0.5 mg  Status:  Discontinued        0.5 mg Per Tube Daily 02/12/24 1126 02/14/24 1241   02/13/24 1000  sulfamethoxazole -trimethoprim  (BACTRIM ) 200-40 MG/5ML suspension 20 mL  Status:  Discontinued        20 mL Per Tube Every M-W-F 02/11/24 1242 02/14/24 1241   02/11/24 1100  acyclovir  (ZOVIRAX ) 200 MG/5ML suspension SUSP 800 mg  Status:  Discontinued        800 mg Per Tube 2 times daily 02/11/24 1006 02/14/24 1241   02/11/24 1100  sulfamethoxazole -trimethoprim  (BACTRIM ) 200-40 MG/5ML suspension 20 mL  Status:  Discontinued        20 mL Per Tube Daily 02/11/24 1006 02/11/24 1242   02/11/24 1100  entecavir  (BARACLUDE ) tablet 0.5 mg  Status:  Discontinued        0.5 mg Per Tube Every 24 hours 02/11/24 1006 02/12/24 1126   02/11/24 0945  piperacillin -tazobactam (ZOSYN ) IVPB 3.375 g        3.375 g 12.5 mL/hr over 240 Minutes Intravenous Every 8 hours 02/11/24 0942     02/10/24 2115  cefTRIAXone  (ROCEPHIN ) 2 g in sodium chloride  0.9 % 100 mL IVPB  Status:  Discontinued        2 g 200 mL/hr over 30 Minutes Intravenous Every 24 hours 02/10/24 2109 02/11/24 0942       Objective:  Vital Signs  Vitals:   02/15/24 2025 02/15/24 2327 02/16/24 0405 02/16/24 0800  BP: (!)  164/99 (!) 135/99 (!) 149/99 (!) 151/90  Pulse: 88 79 80 66  Resp: 18 16 18 16   Temp: 98 F (36.7 C) 98.6 F (37 C) 97.6 F (36.4 C) 97.9 F (36.6 C)  TempSrc: Oral Oral Oral Oral  SpO2: 100% 100%    Weight:   101.5 kg   Height:        Intake/Output Summary (Last 24 hours) at 02/16/2024 1018 Last data filed at 02/16/2024 0030 Gross per 24 hour  Intake 480 ml  Output 450 ml  Net 30 ml   Filed Weights   02/14/24 0600 02/15/24 0449 02/16/24 0405  Weight: 108.6 kg 102.7 kg 101.5 kg    General appearance: Awake alert.  In no distress Resp: Clear to auscultation bilaterally.  Normal effort Cardio: S1-S2 is normal regular.  No S3-S4.  No rubs murmurs or bruit GI: Abdomen is  soft.  Nontender nondistended.  Bowel sounds are present normal.  No masses organomegaly Extremities: No edema.  Full range of motion of lower extremities.   Lab Results:  Data Reviewed: I have personally reviewed following labs and reports of the imaging studies  CBC: Recent Labs  Lab 02/12/24 0327 02/13/24 0222 02/14/24 1134 02/15/24 0319 02/16/24 0807  WBC 4.8 3.5* 5.4 3.6* 3.4*  HGB 11.0* 10.2* 12.4* 11.5* 12.7*  HCT 32.2* 30.4* 35.9* 32.2* 36.2*  MCV 95.3 96.2 94.5 91.2 91.6  PLT 146* 144* 173 162 162    Basic Metabolic Panel: Recent Labs  Lab 02/11/24 1630 02/12/24 0327 02/13/24 0222 02/14/24 0723 02/15/24 0319 02/16/24 0807  NA 146* 144 142 140 142 139  K 3.9 3.9 3.6 5.0 3.3* 4.0  CL 113* 112* 112* 109 104 103  CO2 23 22 21* 16* 25 26  GLUCOSE 88 94 101* 104* 112* 106*  BUN 20 19 19 15 12 10   CREATININE 1.05 1.09 1.05 0.85 0.92 0.97  CALCIUM  8.5* 8.6* 8.8* 9.2 9.4 10.0  MG 2.0 2.2 2.2 2.0 1.8 2.2  PHOS 2.7 2.6 3.2 4.1 3.7  --     GFR: Estimated Creatinine Clearance: 110.7 mL/min (by C-G formula based on SCr of 0.97 mg/dL).  Liver Function Tests: Recent Labs  Lab 02/11/24 0937 02/12/24 0327 02/13/24 0222 02/14/24 0723 02/15/24 0319  AST 50* 37 23 27 24   ALT 81* 67*  48* 43 41  ALKPHOS 65 57 52 57 57  BILITOT 0.4 0.5 0.5 0.6 0.6  PROT 6.0* 5.3* 5.2* 5.6* 5.9*  ALBUMIN 4.0 3.5 3.3* 3.4* 3.7    Recent Labs  Lab 02/10/24 1809  LIPASE 86*   BNP (last 3 results) Recent Labs    02/10/24 1809  PROBNP 83.3   CBG: Recent Labs  Lab 02/13/24 1516 02/13/24 1927 02/13/24 2322 02/14/24 0307 02/14/24 0729  GLUCAP 80 115* 102* 114* 108*    Lipid Profile: Recent Labs    02/14/24 0723  TRIG 156*    Recent Results (from the past 240 hours)  Resp panel by RT-PCR (RSV, Flu A&B, Covid) Anterior Nasal Swab     Status: Abnormal   Collection Time: 02/10/24  6:29 PM   Specimen: Anterior Nasal Swab  Result Value Ref Range Status   SARS Coronavirus 2 by RT PCR POSITIVE (A) NEGATIVE Final   Influenza A by PCR NEGATIVE NEGATIVE Final   Influenza B by PCR NEGATIVE NEGATIVE Final    Comment: (NOTE) The Xpert Xpress SARS-CoV-2/FLU/RSV plus assay is intended as an aid in the diagnosis of influenza from Nasopharyngeal swab specimens and should not be used as a sole basis for treatment. Nasal washings and aspirates are unacceptable for Xpert Xpress SARS-CoV-2/FLU/RSV testing.  Fact Sheet for Patients: bloggercourse.com  Fact Sheet for Healthcare Providers: seriousbroker.it  This test is not yet approved or cleared by the [place]  FDA and has been authorized for detection and/or diagnosis of SARS-CoV-2 by FDA under an Emergency Use Authorization (EUA). This EUA will remain in effect (meaning this test can be used) for the duration of the COVID-19 declaration under Section 564(b)(1) of the Act, 21 U.S.C. section 360bbb-3(b)(1), unless the authorization is terminated or revoked.     Resp Syncytial Virus by PCR NEGATIVE NEGATIVE Final    Comment: (NOTE) Fact Sheet for Patients: bloggercourse.com  Fact Sheet for Healthcare  Providers: seriousbroker.it  This test is not yet approved or cleared by the [place]  FDA and has been authorized for  detection and/or diagnosis of SARS-CoV-2 by FDA under an Emergency Use Authorization (EUA). This EUA will remain in effect (meaning this test can be used) for the duration of the COVID-19 declaration under Section 564(b)(1) of the Act, 21 U.S.C. section 360bbb-3(b)(1), unless the authorization is terminated or revoked.  Performed at [facility], [address], [place], [place] [address]   Culture, Respiratory w Gram Stain     Status: None   Collection Time: 02/10/24  9:10 PM   Specimen: Tracheal Aspirate; Respiratory  Result Value Ref Range Status   Specimen Description TRACHEAL ASPIRATE  Final   Special Requests NONE  Final   Gram Stain   Final    FEW WBC PRESENT, PREDOMINANTLY PMN NO ORGANISMS SEEN Performed at [facility], [address] [place], [place] [address]    Culture FEW PSEUDOMONAS AERUGINOSA  Final   Report Status 02/14/2024 FINAL  Final   Organism ID, Bacteria PSEUDOMONAS AERUGINOSA  Final      Susceptibility   Pseudomonas aeruginosa - MIC*    MEROPENEM 1 SENSITIVE Sensitive     CIPROFLOXACIN 0.25 SENSITIVE Sensitive     IMIPENEM 2 SENSITIVE Sensitive     PIP/TAZO Value in next row Sensitive      8 SENSITIVEThis is a modified FDA-approved test that has been validated and its performance characteristics determined by the reporting laboratory.  This laboratory is certified under the Clinical Laboratory Improvement Amendments CLIA as qualified to perform high complexity clinical laboratory testing.    CEFEPIME Value in next row Sensitive      8 SENSITIVEThis is a modified FDA-approved test that has been validated and its performance characteristics determined by the reporting laboratory.  This laboratory is certified under the Clinical Laboratory Improvement Amendments CLIA as qualified to perform high  complexity clinical laboratory testing.    CEFTAZIDIME/AVIBACTAM Value in next row Sensitive      8 SENSITIVEThis is a modified FDA-approved test that has been validated and its performance characteristics determined by the reporting laboratory.  This laboratory is certified under the Clinical Laboratory Improvement Amendments CLIA as qualified to perform high complexity clinical laboratory testing.    CEFTOLOZANE/TAZOBACTAM Value in next row Sensitive      8 SENSITIVEThis is a modified FDA-approved test that has been validated and its performance characteristics determined by the reporting laboratory.  This laboratory is certified under the Clinical Laboratory Improvement Amendments CLIA as qualified to perform high complexity clinical laboratory testing.    TOBRAMYCIN Value in next row Sensitive      8 SENSITIVEThis is a modified FDA-approved test that has been validated and its performance characteristics determined by the reporting laboratory.  This laboratory is certified under the Clinical Laboratory Improvement Amendments CLIA as qualified to perform high complexity clinical laboratory testing.    CEFTAZIDIME Value in next row Sensitive      8 SENSITIVEThis is a modified FDA-approved test that has been validated and its performance characteristics determined by the reporting laboratory.  This laboratory is certified under the Clinical Laboratory Improvement Amendments CLIA as qualified to perform high complexity clinical laboratory testing.    * FEW PSEUDOMONAS AERUGINOSA  MRSA Next Gen by PCR, Nasal     Status: None   Collection Time: 02/10/24 10:23 PM   Specimen: Nasal Mucosa; Nasal Swab  Result Value Ref Range Status   MRSA by PCR Next Gen NOT DETECTED NOT DETECTED Final    Comment: (NOTE) The GeneXpert MRSA Assay (FDA approved for NASAL  specimens only), is one component of a comprehensive MRSA colonization surveillance program. It is not intended to diagnose MRSA infection nor to  guide or monitor treatment for MRSA infections. Test performance is not FDA approved in patients less than 35 years old. Performed at [facility], [address], [place], [place] [address]   Culture, blood (Routine X 2) w Reflex to ID Panel     Status: None   Collection Time: 02/10/24 11:14 PM   Specimen: BLOOD LEFT ARM  Result Value Ref Range Status   Specimen Description BLOOD LEFT ARM  Final   Special Requests   Final    BOTTLES DRAWN AEROBIC AND ANAEROBIC Blood Culture adequate volume   Culture   Final    NO GROWTH 5 DAYS Performed at [facility], [address], [place], [place] [address]    Report Status 02/15/2024 FINAL  Final  Culture, blood (Routine X 2) w Reflex to ID Panel     Status: Abnormal   Collection Time: 02/10/24 11:25 PM   Specimen: BLOOD LEFT HAND  Result Value Ref Range Status   Specimen Description BLOOD LEFT HAND  Final   Special Requests   Final    BOTTLES DRAWN AEROBIC AND ANAEROBIC Blood Culture adequate volume   Culture  Setup Time   Final    GRAM POSITIVE COCCI ANAEROBIC BOTTLE ONLY CRITICAL RESULT CALLED TO, READ BACK BY AND VERIFIED WITH: PHARMD E. REOME [id number] @ 1939 FH    Culture (A)  Final    STAPHYLOCOCCUS EPIDERMIDIS THE SIGNIFICANCE OF ISOLATING THIS ORGANISM FROM A SINGLE SET OF BLOOD CULTURES WHEN MULTIPLE SETS ARE DRAWN IS UNCERTAIN. PLEASE NOTIFY THE MICROBIOLOGY DEPARTMENT WITHIN ONE WEEK IF SPECIATION AND SENSITIVITIES ARE REQUIRED. Performed at [facility], [address], [place], [place] [address]    Report Status 02/13/2024 FINAL  Final  Blood Culture ID Panel (Reflexed)     Status: Abnormal   Collection Time: 02/10/24 11:25 PM  Result Value Ref Range Status   Enterococcus faecalis NOT DETECTED NOT DETECTED Final   Enterococcus Faecium NOT DETECTED NOT DETECTED Final   Listeria monocytogenes NOT DETECTED NOT DETECTED Final   Staphylococcus species DETECTED (A) NOT DETECTED Final    Comment: CRITICAL RESULT  CALLED TO, READ BACK BY AND VERIFIED WITH: PHARMD E. REOME [id number] @ 1939 FH    Staphylococcus aureus (BCID) NOT DETECTED NOT DETECTED Final   Staphylococcus epidermidis DETECTED (A) NOT DETECTED Final    Comment: Methicillin (oxacillin) resistant coagulase negative staphylococcus. Possible blood culture contaminant (unless isolated from more than one blood culture draw or clinical case suggests pathogenicity). No antibiotic treatment is indicated for blood  culture contaminants. CRITICAL RESULT CALLED TO, READ BACK BY AND VERIFIED WITH: PHARMD E. REOME [id number] @ 1939 FH    Staphylococcus lugdunensis NOT DETECTED NOT DETECTED Final   Streptococcus species NOT DETECTED NOT DETECTED Final   Streptococcus agalactiae NOT DETECTED NOT DETECTED Final   Streptococcus pneumoniae NOT DETECTED NOT DETECTED Final   Streptococcus pyogenes NOT DETECTED NOT DETECTED Final   A.calcoaceticus-baumannii NOT DETECTED NOT DETECTED Final   Bacteroides fragilis NOT DETECTED NOT DETECTED Final   Enterobacterales NOT DETECTED NOT DETECTED Final   Enterobacter cloacae complex NOT DETECTED NOT DETECTED Final   Escherichia coli NOT DETECTED NOT DETECTED Final   Klebsiella aerogenes NOT DETECTED NOT DETECTED Final   Klebsiella oxytoca NOT DETECTED NOT DETECTED Final   Klebsiella pneumoniae NOT DETECTED NOT DETECTED Final  Proteus species NOT DETECTED NOT DETECTED Final   Salmonella species NOT DETECTED NOT DETECTED Final   Serratia marcescens NOT DETECTED NOT DETECTED Final   Haemophilus influenzae NOT DETECTED NOT DETECTED Final   Neisseria meningitidis NOT DETECTED NOT DETECTED Final   Pseudomonas aeruginosa NOT DETECTED NOT DETECTED Final   Stenotrophomonas maltophilia NOT DETECTED NOT DETECTED Final   Candida albicans NOT DETECTED NOT DETECTED Final   Candida auris NOT DETECTED NOT DETECTED Final   Candida glabrata NOT DETECTED NOT DETECTED Final   Candida krusei NOT DETECTED NOT DETECTED Final   Candida  parapsilosis NOT DETECTED NOT DETECTED Final   Candida tropicalis NOT DETECTED NOT DETECTED Final   Cryptococcus neoformans/gattii NOT DETECTED NOT DETECTED Final   Methicillin resistance mecA/C DETECTED (A) NOT DETECTED Final    Comment: CRITICAL RESULT CALLED TO, READ BACK BY AND VERIFIED WITH: MAYA DRAFTS REOME [id number] @ 1939 FH Performed at [facility], [address] [place], [place] [address]       Radiology Studies: No results found.     LOS: 6 days   Elide Stalzer [employer] on [email or web address]  02/16/2024, 10:18 AM   "

## 2024-02-17 ENCOUNTER — Encounter (HOSPITAL_COMMUNITY): Payer: Self-pay | Admitting: Internal Medicine

## 2024-02-17 ENCOUNTER — Encounter (HOSPITAL_COMMUNITY): Admission: EM | Disposition: A | Payer: Self-pay | Source: Home / Self Care | Attending: Internal Medicine

## 2024-02-17 DIAGNOSIS — I5021 Acute systolic (congestive) heart failure: Secondary | ICD-10-CM | POA: Diagnosis not present

## 2024-02-17 DIAGNOSIS — I509 Heart failure, unspecified: Secondary | ICD-10-CM

## 2024-02-17 DIAGNOSIS — E876 Hypokalemia: Secondary | ICD-10-CM | POA: Diagnosis not present

## 2024-02-17 DIAGNOSIS — J151 Pneumonia due to Pseudomonas: Secondary | ICD-10-CM | POA: Diagnosis not present

## 2024-02-17 DIAGNOSIS — I469 Cardiac arrest, cause unspecified: Secondary | ICD-10-CM | POA: Diagnosis not present

## 2024-02-17 HISTORY — PX: RIGHT/LEFT HEART CATH AND CORONARY ANGIOGRAPHY: CATH118266

## 2024-02-17 LAB — POCT I-STAT EG7
Acid-base deficit: 3 mmol/L — ABNORMAL HIGH (ref 0.0–2.0)
Acid-base deficit: 1 mmol/L (ref 0.0–2.0)
Bicarbonate: 22.1 mmol/L (ref 20.0–28.0)
Bicarbonate: 23.9 mmol/L (ref 20.0–28.0)
Calcium, Ion: 1.25 mmol/L (ref 1.15–1.40)
Calcium, Ion: 1.36 mmol/L (ref 1.15–1.40)
HCT: 36 % — ABNORMAL LOW (ref 39.0–52.0)
HCT: 37 % — ABNORMAL LOW (ref 39.0–52.0)
Hemoglobin: 12.2 g/dL — ABNORMAL LOW (ref 13.0–17.0)
Hemoglobin: 12.6 g/dL — ABNORMAL LOW (ref 13.0–17.0)
O2 Saturation: 69 %
O2 Saturation: 62 %
Potassium: 3.4 mmol/L — ABNORMAL LOW (ref 3.5–5.1)
Potassium: 3.7 mmol/L (ref 3.5–5.1)
Sodium: 141 mmol/L (ref 135–145)
Sodium: 140 mmol/L (ref 135–145)
TCO2: 23 mmol/L (ref 22–32)
TCO2: 25 mmol/L (ref 22–32)
pCO2, Ven: 36.8 mmHg — ABNORMAL LOW (ref 44–60)
pCO2, Ven: 40.3 mmHg — ABNORMAL LOW (ref 44–60)
pH, Ven: 7.386 (ref 7.25–7.43)
pH, Ven: 7.382 (ref 7.25–7.43)
pO2, Ven: 36 mmHg (ref 32–45)
pO2, Ven: 33 mmHg (ref 32–45)

## 2024-02-17 LAB — BASIC METABOLIC PANEL WITH GFR
Anion gap: 13 (ref 5–15)
BUN: 15 mg/dL (ref 6–20)
CO2: 24 mmol/L (ref 22–32)
Calcium: 9.6 mg/dL (ref 8.9–10.3)
Chloride: 103 mmol/L (ref 98–111)
Creatinine, Ser: 1.06 mg/dL (ref 0.61–1.24)
GFR, Estimated: >60 mL/min
Glucose, Bld: 100 mg/dL — ABNORMAL HIGH (ref 70–99)
Potassium: 4 mmol/L (ref 3.5–5.1)
Sodium: 140 mmol/L (ref 135–145)

## 2024-02-17 LAB — POCT I-STAT 7, (LYTES, BLD GAS, ICA,H+H)
Acid-Base Excess: 0 mmol/L (ref 0.0–2.0)
Bicarbonate: 23.4 mmol/L (ref 20.0–28.0)
Calcium, Ion: 1.32 mmol/L (ref 1.15–1.40)
HCT: 39 % (ref 39.0–52.0)
Hemoglobin: 13.3 g/dL (ref 13.0–17.0)
O2 Saturation: 95 %
Potassium: 3.8 mmol/L (ref 3.5–5.1)
Sodium: 140 mmol/L (ref 135–145)
TCO2: 24 mmol/L (ref 22–32)
pCO2 arterial: 33.9 mmHg (ref 32–48)
pH, Arterial: 7.446 (ref 7.35–7.45)
pO2, Arterial: 74 mmHg — ABNORMAL LOW (ref 83–108)

## 2024-02-17 MED ORDER — VERAPAMIL HCL 2.5 MG/ML IV SOLN
INTRAVENOUS | Status: DC | PRN
  Administered 2024-02-17: 10 mL via INTRA_ARTERIAL

## 2024-02-17 MED ORDER — SODIUM CHLORIDE 0.9 % IV SOLN: 250.0000 mL | INTRAVENOUS | Status: AC | PRN

## 2024-02-17 MED ORDER — ENSURE MAX PROTEIN PO LIQD
11.0000 [oz_av] | Freq: Two times a day (BID) | ORAL | Status: DC
  Administered 2024-02-17 – 2024-02-19 (×4): 11 [oz_av] via ORAL
  Filled 2024-02-17 (×5): qty 330

## 2024-02-17 MED ORDER — FENTANYL CITRATE (PF) 100 MCG/2ML IJ SOLN
INTRAMUSCULAR | Status: AC
  Filled 2024-02-17: qty 2

## 2024-02-17 MED ORDER — LIDOCAINE HCL (PF) 1 % IJ SOLN
INTRAMUSCULAR | Status: DC | PRN
  Administered 2024-02-17: 5 mL

## 2024-02-17 MED ORDER — LIDOCAINE HCL (PF) 1 % IJ SOLN
INTRAMUSCULAR | Status: AC
  Filled 2024-02-17: qty 30

## 2024-02-17 MED ORDER — ONDANSETRON HCL 4 MG/2ML IJ SOLN: 4.0000 mg | Freq: Four times a day (QID) | INTRAMUSCULAR | Status: DC | PRN

## 2024-02-17 MED ORDER — HYDRALAZINE HCL 20 MG/ML IJ SOLN: 10.0000 mg | INTRAMUSCULAR | Status: AC | PRN

## 2024-02-17 MED ORDER — SODIUM CHLORIDE 0.9% FLUSH: 3.0000 mL | INTRAVENOUS | Status: DC | PRN

## 2024-02-17 MED ORDER — LABETALOL HCL 5 MG/ML IV SOLN: 10.0000 mg | INTRAVENOUS | Status: AC | PRN

## 2024-02-17 MED ORDER — ACETAMINOPHEN 325 MG PO TABS: 650.0000 mg | ORAL_TABLET | ORAL | Status: DC | PRN

## 2024-02-17 MED ORDER — IOHEXOL 350 MG/ML SOLN
INTRAVENOUS | Status: DC | PRN
  Administered 2024-02-17: 50 mL

## 2024-02-17 MED ORDER — SODIUM CHLORIDE 0.9 % IV SOLN
INTRAVENOUS | Status: AC | PRN
  Administered 2024-02-17: 500 mL via INTRAVENOUS

## 2024-02-17 MED ORDER — MIDAZOLAM HCL 2 MG/2ML IJ SOLN
INTRAMUSCULAR | Status: AC
  Filled 2024-02-17: qty 2

## 2024-02-17 MED ORDER — FENTANYL CITRATE (PF) 100 MCG/2ML IJ SOLN
INTRAMUSCULAR | Status: DC | PRN
  Administered 2024-02-17: 25 ug via INTRAVENOUS

## 2024-02-17 MED ORDER — SODIUM CHLORIDE 0.9% FLUSH
3.0000 mL | Freq: Two times a day (BID) | INTRAVENOUS | Status: DC
  Administered 2024-02-17 – 2024-02-19 (×5): 3 mL via INTRAVENOUS

## 2024-02-17 MED ORDER — MIDAZOLAM HCL (PF) 2 MG/2ML IJ SOLN
INTRAMUSCULAR | Status: DC | PRN
  Administered 2024-02-17 (×2): 1 mg via INTRAVENOUS

## 2024-02-17 NOTE — Progress Notes (Signed)
 Physical Therapy Treatment Patient Details Name: Bradley Hunt MRN: [id number] DOB: Jul 13, 1970 Today's Date: 02/17/2024   History of Present Illness The pt is a 54 yo male presenting 1/13 after being found unresponsive by friends at work, CPR by EMS with ROSC after 8 min, unknown total downtime. Intubated on arrival, admission complicated by recurrent posturing episodes vs seizure? (EEG negative) as well as testing positive for Covid, Norovirus, and rotavirus. Extubated 1/16. PMH includes: multiple myeloma, previous bony metastasis, chemo and radiation therapy, and previous pancytopenia.    PT Comments  Pt continuing to progress with stability with mobility as well as cognition. Worked on cognitive activities during mobility today and pt tolerated higher level balance activities without LOB. Has difficulty multitasking and continues to have decreased insight into deficits. Pt ambulated in hallway without LOB and no AD. Of note, after ambulation BP 152/110 and HR up to 120 bpm. SPO2 98% on RA. HR 90 bpm at rest. Changing recommendation to outpt PT at d/c. PT will continue to follow.    If plan is discharge home, recommend the following: Assistance with cooking/housework;Direct supervision/assist for medications management;Direct supervision/assist for financial management;Assist for transportation;Supervision due to cognitive status   Can travel by private vehicle        Equipment Recommendations  None recommended by PT    Recommendations for Other Services       Precautions / Restrictions Precautions Precautions: Fall Recall of Precautions/Restrictions: Impaired Precaution/Restrictions Comments: watch BP (HTN) Restrictions Weight Bearing Restrictions Per Provider Order: No     Mobility  Bed Mobility Overal bed mobility: Independent                  Transfers Overall transfer level: Modified independent Equipment used: None                     Ambulation/Gait Ambulation/Gait assistance: Supervision Gait Distance (Feet): 250 Feet Assistive device: None, IV Pole Gait Pattern/deviations: Step-through pattern, Decreased stride length, Staggering right Gait velocity: decreased Gait velocity interpretation: >2.62 ft/sec, indicative of community ambulatory   General Gait Details: began ambulation in hallway with IV pole but was able to take away and pt ambulate withotu support without LOB today and no R bias.   Stairs             Wheelchair Mobility     Tilt Bed    Modified Rankin (Stroke Patients Only)       Balance Overall balance assessment: Needs assistance Sitting-balance support: No upper extremity supported, Feet supported Sitting balance-Leahy Scale: Good     Standing balance support: No upper extremity supported Standing balance-Leahy Scale: Good Standing balance comment: worked on quick reactions in standing, hitting balloon back and forth. Pt able to reach in all planes and to floor without LOB                            Communication Communication Communication: No apparent difficulties  Cognition Arousal: Alert Behavior During Therapy: WFL for tasks assessed/performed   PT - Cognitive impairments: Orientation, Awareness, Memory, Attention, Initiation, Sequencing, Problem solving, Safety/Judgement   Orientation impairments: Situation                   PT - Cognition Comments: pt able to state the year and month today. Worked on higher level cognition activities during mobility today and pt continuing to improve Following commands: Impaired Following commands impaired: Only follows one step commands consistently,  Follows multi-step commands inconsistently    Cueing Cueing Techniques: Verbal cues, Gestural cues  Exercises      General Comments General comments (skin integrity, edema, etc.): After ambulation: BP 153/110, HR 120 bpm, SPO2 98% on RA. HR at rest 90 bpm       Pertinent Vitals/Pain Pain Assessment Pain Assessment: Faces Faces Pain Scale: Hurts a little bit Pain Location: central chest, mild soreness Pain Descriptors / Indicators: Sore Pain Intervention(s): Limited activity within patient's tolerance, Monitored during session    Home Living                          Prior Function            PT Goals (current goals can now be found in the care plan section) Acute Rehab PT Goals Patient Stated Goal: to return to independence PT Goal Formulation: With patient Time For Goal Achievement: 02/28/24 Potential to Achieve Goals: Good Progress towards PT goals: Progressing toward goals    Frequency    Min 3X/week      PT Plan      Co-evaluation              AM-PAC PT 6 Clicks Mobility   Outcome Measure  Help needed turning from your back to your side while in a flat bed without using bedrails?: None Help needed moving from lying on your back to sitting on the side of a flat bed without using bedrails?: None Help needed moving to and from a bed to a chair (including a wheelchair)?: None Help needed standing up from a chair using your arms (e.g., wheelchair or bedside chair)?: None Help needed to walk in hospital room?: A Little Help needed climbing 3-5 steps with a railing? : A Little 6 Click Score: 22    End of Session   Activity Tolerance: Patient tolerated treatment well Patient left: with call bell/phone within reach;in bed Nurse Communication: Mobility status PT Visit Diagnosis: Unsteadiness on feet (R26.81);Difficulty in walking, not elsewhere classified (R26.2);Ataxic gait (R26.0)     Time: 8496-8462 PT Time Calculation (min) (ACUTE ONLY): 34 min  Charges:    $Gait Training: 8-22 mins $Neuromuscular Re-education: 8-22 mins PT General Charges $$ ACUTE PT VISIT: 1 Visit                     Richerd Lipoma, PT  Acute Rehab Services Secure chat preferred Office [phone]    Richerd CROME  Malon Siddall 02/17/2024, 5:20 PM

## 2024-02-17 NOTE — Progress Notes (Signed)
 The patient's wife is supposed to sign the consent form for the Right and Left heart cath.  She states that she has spoken with the provider, but still has questions and wants to wait to sign the consent.  She has to leave to take children to school and will be gone from the hospital from 0500-0800.  The patient's procedure is scheduled for approximately 0900.  I will pass on the information to the dayshift nurse.  The consent is on the chart.  If possible, maybe the provider can call the patient's wife, Amina at [phone].

## 2024-02-17 NOTE — H&P (View-Only) (Signed)
 "    Advanced Heart Failure Rounding Note  Cardiologist: None  [facility] Cardiologist: Dr. Cherrie Chief Complaint: Cardiac Arrest Patient Profile   Bradley Hunt is a 54 y.o. 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  Significant events:   1/13: OOH cardiac arrest w/o bystander CPR. Shockable rhythm by EMS, intubated. ?Seizure 1/14: EF 20-25%, G1DD, nl RV function 1/16: Extubated  Subjective:    Plan for [facility] today. Dicussed with wife via phone this morning.   Remains mildly confused but conversant. Denies CP or SOB.   Objective:    Weight Range: 101 kg Body mass index is 28.59 kg/m.   Vital Signs:   Temp:  [98.1 F (36.7 C)-98.9 F (37.2 C)] 98.1 F (36.7 C) (01/20 0751) Pulse Rate:  [81-104] 81 (01/20 0440) Resp:  [16-18] 16 (01/20 0751) BP: (112-153)/(78-95) 126/90 (01/20 0440) SpO2:  [98 %-100 %] 100 % (01/20 0440) Weight:  [101 kg] 101 kg (01/20 0440) Last BM Date : 02/15/24  Weight change: Filed Weights   02/15/24 0449 02/16/24 0405 02/17/24 0440  Weight: 102.7 kg 101.5 kg 101 kg   Intake/Output:  Intake/Output Summary (Last 24 hours) at 02/17/2024 0926 Last data filed at 02/16/2024 1400 Gross per 24 hour  Intake 300 ml  Output --  Net 300 ml    Physical Exam   General:  Sitting up in bed. No resp difficulty HEENT: normal Neck: supple. no JVD.  Cor: Regular rate & rhythm. No rubs, gallops or murmurs. Lungs: clear Abdomen: soft, nontender, nondistended.Good bowel sounds. Extremities: no cyanosis, clubbing, rash, edema Neuro: Conversant. Mildly confused.   Telemetry   SR  80s, occasional PVCs (personally reviewed)  Labs   CBC Recent Labs    02/15/24 0319 02/16/24 0807  WBC 3.6*  3.4*  HGB 11.5* 12.7*  HCT 32.2* 36.2*  MCV 91.2 91.6  PLT 162 162   Basic Metabolic Panel Recent Labs    98/81/73 0319 02/16/24 0807 02/17/24 0433  NA 142 139 140  K 3.3* 4.0 4.0  CL 104 103 103  CO2 25 26 24   GLUCOSE 112* 106* 100*  BUN 12 10 15   CREATININE 0.92 0.97 1.06  CALCIUM  9.4 10.0 9.6  MG 1.8 2.2  --   PHOS 3.7  --   --    Liver Function Tests Recent Labs    02/15/24 0319  AST 24  ALT 41  ALKPHOS 57  BILITOT 0.6  PROT 5.9*  ALBUMIN 3.7   No results for input(s): LIPASE, AMYLASE in the last 72 hours.  ProBNP (last 3 results) Recent Labs    02/10/24 1809  PROBNP 83.3   Medications:    Scheduled Medications:  acyclovir   800 mg Oral BID   amiodarone   200 mg Oral BID   carvedilol   3.125 mg Oral BID WC   entecavir   0.5 mg Oral Daily   feeding supplement  1 Container Oral TID BM   [START ON 02/18/2024] fondaparinux  (ARIXTRA ) injection  2.5 mg Subcutaneous Q24H   lidocaine   1 patch Transdermal QHS   sacubitril -valsartan   1 tablet Oral BID   sodium chloride  flush  3 mL Intravenous Q12H   spironolactone   25 mg Oral Daily   sulfamethoxazole -trimethoprim   1 tablet Oral Once per day on Monday Wednesday Friday  Infusions:  sodium chloride      piperacillin -tazobactam (ZOSYN )  IV 3.375 g (02/17/24 0432)    PRN Medications: sodium chloride , acetaminophen  **OR** acetaminophen  (TYLENOL ) oral liquid 160 mg/5 mL **OR** acetaminophen , HYDROmorphone  (DILAUDID ) injection, melatonin, ondansetron  (ZOFRAN ) IV, polyethylene glycol, senna, sodium chloride  flush  Assessment/Plan   Cardiac Arrest,  VT/VF,  Acute on chronic HFrEF: Found unresponsive by friend, no bystander CPR. Shockable rhythm on EMS arrival, shock x1. Known EF 35-40%. In the setting of severe electrolytes derangements K 2.8 on admission, due to infectious process/diarrhea. He has norovirus, COVID, and rhinovirus.  - Echo read as 20-25% (30-35% on Dr. Charley read which is his baseline) -  Initial concern for severe anoxic injury but extubated 1/16 and mental status improving but not back to baseline - Appears euvolemic. R/LHC today, re-dicussed with spouse this morning - Titrate GDMT as tolerated - continue coreg  3.125 mg bid - continue entresto  49/51 mg bid - continue spiro 25 gm daily - hold on SGLT2i  - Suspect VT/VF related to CM and electrolyte abnormalities but he warrants cath and if clean will discuss with EP if he is candidate for ICD - Will consult EP re: ICD vs Lifevest  Acute hypoxic resp failure, Rhinovius, COVID infection, ?CAP - on vent; mgmt per CCM - on zosyn /entecavir  - Extubated 1/16  Multiple Myeloma with Spinal METS - s/p chemo and stem cell transplant 2018 - recent recurrence s/p recent CAR-T and fludarabine /cytoxan  lymphodepletion at [facility] - on bactrim /acyclovir  for ppx after tx  Hypokalemia, Hypomagnesemia - supp as needed - K 4.0 today  Anoxic brain injury - remains confused but improving  PVCs - continue amio  Length of Stay: 7  [name], NP  02/17/2024, 9:26 AM  Advanced Heart Failure Team Pager [phone] (M-F; 7a - 5p)   Please visit [email or web address]: For overnight coverage please call cardiology fellow first. If fellow not available call Shock/ECMO MD on call.  For ECMO / Mechanical Support (Impella, IABP, LVAD) issues call Shock / ECMO MD on call.   Patient seen and examined with the above-signed Advanced Practice Provider and/or Housestaff. I personally reviewed laboratory data, imaging studies and relevant notes. I independently examined the patient and formulated the important aspects of the plan. I have edited the note to reflect any of my changes or salient points. I have personally discussed the plan with the patient and/or family.  Remains pleasantly confused. Denies CP or SOB. GDMT has been titrated. Rhythm stable  General:  Sitting up in bed. No resp difficulty HEENT: normal Neck: supple. no JVD.  Cor: Regular rate &  rhythm. No rubs, gallops or murmurs. Lungs: clear Abdomen: soft, nontender, nondistended.Good bowel sounds. Extremities: no cyanosis, clubbing, rash, edema Neuro: conversant mildly confused at times.   Plan R/L cath today (discussed with his wife.) Await input from EP for ICD vs Lifevest.   Toribio Fuel, MD  10:01 AM     "

## 2024-02-17 NOTE — Progress Notes (Signed)
 Nutrition Follow-up  DOCUMENTATION CODES:   Not applicable  INTERVENTION:  - Heart healthy diet per MD.  - Ensure Max po BID, each supplement provides 150 kcal and 30 grams of protein.  - Monitor weight trends.   NUTRITION DIAGNOSIS:   Increased nutrient needs related to acute illness as evidenced by estimated needs. *new  GOAL:   Patient will meet greater than or equal to 90% of their needs *progressing  MONITOR:   PO intake, Supplement acceptance, Weight trends  REASON FOR ASSESSMENT:   Consult, Ventilator Enteral/tube feeding initiation and management  ASSESSMENT:   54 yo male admitted post cardiac arrest, acute respiratory failure-COVID+. Pt with significant diarrhea recently: +Norovirus/Rotavirus/Enteroaggregative E.Coli PCR on 1/06 at [facility]. PTA. PMH includes multiple myeloma with spinal mets, s/p stem cell transplant, chemo and radiation therapy  1/13 Admitted, Cardiac Arrest, COVID +  1/14 Trickle TF initiated 1/15 TF increased to goal  1/16 Extubated 1/17 Diet advanced  Patient reports a UBW of 265# and that he has lost weight over the past ~2 months due to ongoing infections and illnesses. Notes he was losing weight prior to this but that it was intentional.  EMR confirms patient's reported trends. Insignificant weight loss from January 2025-November 2025. However, patient weighed at 251# on 11/11 and has continued to lose weight into this admission to current weight of 222#. This is a 29# or 11.6% weight loss in 2 months, which is significant for the time frame.  Patient endorses typically eating 2 meals a day (lunch and dinner) at this baseline. He endorses that despite being sick he has eaten well the entire time.   Endorses appetite is normal and he has been eating about his normal amount this admission. Discussed increased nutrient needs with infections and need to increase oral intake to help prevent weight loss. Patient is weary to receive ONS with a lot  of calories as he does not want to gain weight (note this makes it hard for him to breathe). However, agreeable to try Ensure Max to support intake.   Admit weight: 235# Current weight: 222# I&Os': +2.8L since admit  Medications reviewed and include: -  Labs reviewed:  -  NUTRITION - FOCUSED PHYSICAL EXAM:  Flowsheet Row Most Recent Value  Orbital Region No depletion  Upper Arm Region No depletion  Thoracic and Lumbar Region No depletion  Buccal Region No depletion  Temple Region No depletion  Clavicle Bone Region No depletion  Clavicle and Acromion Bone Region No depletion  Scapular Bone Region Unable to assess  Dorsal Hand No depletion  Patellar Region No depletion  Anterior Thigh Region No depletion  Posterior Calf Region No depletion  Edema (RD Assessment) None  Hair Reviewed  Eyes Reviewed  Mouth Reviewed  Skin Reviewed  Nails Reviewed    Diet Order:   Diet Order             Diet Heart Room service appropriate? Yes; Fluid consistency: Thin  Diet effective now                   EDUCATION NEEDS:  Education needs have been addressed  Skin:  Skin Assessment: Reviewed RN Assessment  Last BM:  1/18 - type 6  Height:  Ht Readings from Last 1 Encounters:  02/10/24 6' 2 (1.88 m)   Weight:  Wt Readings from Last 1 Encounters:  02/17/24 101 kg   BMI:  Body mass index is 28.59 kg/m.  Estimated Nutritional Needs:  Kcal:  2200-2400 kcals Protein:  100-120 grams Fluid:  >/= 2L    Trude Ned RD, LDN Contact via Secure Chat.

## 2024-02-17 NOTE — Progress Notes (Signed)
 "    Advanced Heart Failure Rounding Note  Cardiologist: None  [facility] Cardiologist: Dr. Cherrie Chief Complaint: Cardiac Arrest Patient Profile   Bradley Hunt is a 54 y.o. 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  Significant events:   1/13: OOH cardiac arrest w/o bystander CPR. Shockable rhythm by EMS, intubated. ?Seizure 1/14: EF 20-25%, G1DD, nl RV function 1/16: Extubated  Subjective:    Plan for [facility] today. Dicussed with wife via phone this morning.   Remains mildly confused but conversant. Denies CP or SOB.   Objective:    Weight Range: 101 kg Body mass index is 28.59 kg/m.   Vital Signs:   Temp:  [98.1 F (36.7 C)-98.9 F (37.2 C)] 98.1 F (36.7 C) (01/20 0751) Pulse Rate:  [81-104] 81 (01/20 0440) Resp:  [16-18] 16 (01/20 0751) BP: (112-153)/(78-95) 126/90 (01/20 0440) SpO2:  [98 %-100 %] 100 % (01/20 0440) Weight:  [101 kg] 101 kg (01/20 0440) Last BM Date : 02/15/24  Weight change: Filed Weights   02/15/24 0449 02/16/24 0405 02/17/24 0440  Weight: 102.7 kg 101.5 kg 101 kg   Intake/Output:  Intake/Output Summary (Last 24 hours) at 02/17/2024 0926 Last data filed at 02/16/2024 1400 Gross per 24 hour  Intake 300 ml  Output --  Net 300 ml    Physical Exam   General:  Sitting up in bed. No resp difficulty HEENT: normal Neck: supple. no JVD.  Cor: Regular rate & rhythm. No rubs, gallops or murmurs. Lungs: clear Abdomen: soft, nontender, nondistended.Good bowel sounds. Extremities: no cyanosis, clubbing, rash, edema Neuro: Conversant. Mildly confused.   Telemetry   SR  80s, occasional PVCs (personally reviewed)  Labs   CBC Recent Labs    02/15/24 0319 02/16/24 0807  WBC 3.6*  3.4*  HGB 11.5* 12.7*  HCT 32.2* 36.2*  MCV 91.2 91.6  PLT 162 162   Basic Metabolic Panel Recent Labs    98/81/73 0319 02/16/24 0807 02/17/24 0433  NA 142 139 140  K 3.3* 4.0 4.0  CL 104 103 103  CO2 25 26 24   GLUCOSE 112* 106* 100*  BUN 12 10 15   CREATININE 0.92 0.97 1.06  CALCIUM  9.4 10.0 9.6  MG 1.8 2.2  --   PHOS 3.7  --   --    Liver Function Tests Recent Labs    02/15/24 0319  AST 24  ALT 41  ALKPHOS 57  BILITOT 0.6  PROT 5.9*  ALBUMIN 3.7   No results for input(s): LIPASE, AMYLASE in the last 72 hours.  ProBNP (last 3 results) Recent Labs    02/10/24 1809  PROBNP 83.3   Medications:    Scheduled Medications:  acyclovir   800 mg Oral BID   amiodarone   200 mg Oral BID   carvedilol   3.125 mg Oral BID WC   entecavir   0.5 mg Oral Daily   feeding supplement  1 Container Oral TID BM   [START ON 02/18/2024] fondaparinux  (ARIXTRA ) injection  2.5 mg Subcutaneous Q24H   lidocaine   1 patch Transdermal QHS   sacubitril -valsartan   1 tablet Oral BID   sodium chloride  flush  3 mL Intravenous Q12H   spironolactone   25 mg Oral Daily   sulfamethoxazole -trimethoprim   1 tablet Oral Once per day on Monday Wednesday Friday  Infusions:  sodium chloride      piperacillin -tazobactam (ZOSYN )  IV 3.375 g (02/17/24 0432)    PRN Medications: sodium chloride , acetaminophen  **OR** acetaminophen  (TYLENOL ) oral liquid 160 mg/5 mL **OR** acetaminophen , HYDROmorphone  (DILAUDID ) injection, melatonin, ondansetron  (ZOFRAN ) IV, polyethylene glycol, senna, sodium chloride  flush  Assessment/Plan   Cardiac Arrest,  VT/VF,  Acute on chronic HFrEF: Found unresponsive by friend, no bystander CPR. Shockable rhythm on EMS arrival, shock x1. Known EF 35-40%. In the setting of severe electrolytes derangements K 2.8 on admission, due to infectious process/diarrhea. He has norovirus, COVID, and rhinovirus.  - Echo read as 20-25% (30-35% on Dr. Charley read which is his baseline) -  Initial concern for severe anoxic injury but extubated 1/16 and mental status improving but not back to baseline - Appears euvolemic. R/LHC today, re-dicussed with spouse this morning - Titrate GDMT as tolerated - continue coreg  3.125 mg bid - continue entresto  49/51 mg bid - continue spiro 25 gm daily - hold on SGLT2i  - Suspect VT/VF related to CM and electrolyte abnormalities but he warrants cath and if clean will discuss with EP if he is candidate for ICD - Will consult EP re: ICD vs Lifevest  Acute hypoxic resp failure, Rhinovius, COVID infection, ?CAP - on vent; mgmt per CCM - on zosyn /entecavir  - Extubated 1/16  Multiple Myeloma with Spinal METS - s/p chemo and stem cell transplant 2018 - recent recurrence s/p recent CAR-T and fludarabine /cytoxan  lymphodepletion at [facility] - on bactrim /acyclovir  for ppx after tx  Hypokalemia, Hypomagnesemia - supp as needed - K 4.0 today  Anoxic brain injury - remains confused but improving  PVCs - continue amio  Length of Stay: 7  [name], NP  02/17/2024, 9:26 AM  Advanced Heart Failure Team Pager [phone] (M-F; 7a - 5p)   Please visit [email or web address]: For overnight coverage please call cardiology fellow first. If fellow not available call Shock/ECMO MD on call.  For ECMO / Mechanical Support (Impella, IABP, LVAD) issues call Shock / ECMO MD on call.   Patient seen and examined with the above-signed Advanced Practice Provider and/or Housestaff. I personally reviewed laboratory data, imaging studies and relevant notes. I independently examined the patient and formulated the important aspects of the plan. I have edited the note to reflect any of my changes or salient points. I have personally discussed the plan with the patient and/or family.  Remains pleasantly confused. Denies CP or SOB. GDMT has been titrated. Rhythm stable  General:  Sitting up in bed. No resp difficulty HEENT: normal Neck: supple. no JVD.  Cor: Regular rate &  rhythm. No rubs, gallops or murmurs. Lungs: clear Abdomen: soft, nontender, nondistended.Good bowel sounds. Extremities: no cyanosis, clubbing, rash, edema Neuro: conversant mildly confused at times.   Plan R/L cath today (discussed with his wife.) Await input from EP for ICD vs Lifevest.   Toribio Fuel, MD  10:01 AM     "

## 2024-02-17 NOTE — Interval H&P Note (Signed)
 History and Physical Interval Note:  02/17/2024 11:05 AM  Bradley Hunt  has presented today for surgery, with the diagnosis of heart failure.  The various methods of treatment have been discussed with the patient and family. After consideration of risks, benefits and other options for treatment, the patient has consented to  Procedures: RIGHT/LEFT HEART CATH AND CORONARY ANGIOGRAPHY (N/A) as a surgical intervention.  The patient's history has been reviewed, patient examined, no change in status, stable for surgery.  I have reviewed the patient's chart and labs.  Questions were answered to the patient's satisfaction.     Mathias Bogacki

## 2024-02-17 NOTE — Progress Notes (Signed)
 PT Cancellation Note  Patient Details Name: Bradley Hunt MRN: [id number] DOB: 1970-07-21   Cancelled Treatment:    Reason Eval/Treat Not Completed: Patient at procedure or test/unavailable (cath lab). Will follow up with pt tomorrow.  Richerd Lipoma, PT  Acute Rehab Services Secure chat preferred Office [phone]    Richerd LITTIE Lipoma 02/17/2024, 10:32 AM

## 2024-02-17 NOTE — Progress Notes (Addendum)
 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  S/p autologous stem cells 2018. HFrEF previously on Entresto , which was stopped during a previous admission for hypotension. Family reports he had been doing well. No complaints at all. In his usual state of health when a friend heard him collapse in the bathroom and called EMS. The patient was unresponsive. Upon EMS arrival the patient was pulseless and CPR was initiated. Shockable rhythm identified and he was shocked x 1. CPR duration a total of 10 mins. LMA was placed in the field and was removed when the patient began to gag. Remained unresponsive in the ED and was intubated for airway protection.  He was admitted to the ICU.  He was stabilized and then transferred to the floor.  Consultants: Critical care medicine.  Cardiology.  Neurology.  Procedures: EEG  Subjective/Interval History: Patient feels well.  Denies any chest pain or shortness of breath.  No nausea or vomiting.  Denies any difficulty with ambulation.   Assessment/Plan:  Cardiac arrest/ventricular tachycardia/chronic systolic CHF Cardiac arrest thought to be due to electrolyte abnormalities.  His potassium was 2.8 with admission.  Infectious etiology also thought to be contributing.  Apparently had norovirus COVID-19 and rhinovirus. Echocardiogram shows EF between 20 to 35%. Cardiology is following.   Patient to undergo cardiac catheterization today.  EP to see patient for consideration of LifeVest versus ICD. Patient is currently on the following cardiac medications: Amiodarone , carvedilol , Entresto , spironolactone . Cardiac status is stable.  Pseudomonas pneumonia/acute respiratory failure with  hypoxia Noted to be on Zosyn .  Sensitivities reviewed.  Noted to be pansensitive.  Plan for 7-day course.  He will complete the course tonight/tomorrow. Staph epidermidis in the blood is most likely a contaminant. Respiratory status is stable.  Not on oxygen.  COVID-19 Noted to be incidentally positive.  No clear evidence for active infection currently.  History of multiple myeloma Followed by medical oncology.  He is status post CAR-T infusion at [facility] in 2025.  Followed by Dr. Lonn.  Concern for seizure activity/anoxic brain injury Was on Keppra  at that time for prophylaxis against neurotoxicity.  No longer on Keppra .  This was confirmed by patient's wife. There was some concern for seizure activity at the time of admission.  Evaluated by neurology.  Underwent EEG.  Neurology does not feel that he needs to be on antiepileptics.  Keppra  was discontinued. His mentation is gradually improving.  Acute diarrhea According to CCM notes he has norovirus and rotavirus.  Diarrhea appears to have subsided.  No positive test noted in our EMR.  Hypokalemia Supplemented.  Magnesium  is 2.2.    Normocytic anemia Stable hemoglobin.  No evidence of overt blood loss.  No need to check hemoglobin on a daily basis.  DVT Prophylaxis: No heparin  products due to religion.  Noted to be on Arixtra . Code Status: Full code Family Communication: Discussed with patient.  No family at bedside today. Disposition Plan: Initially inpatient rehabilitation was being considered.  However they do not think that he is a candidate currently since his mobility has improved.  He should be able to go home.    Medications: Scheduled:  acyclovir   800 mg Oral BID   amiodarone   200 mg Oral BID   carvedilol   3.125 mg Oral  BID WC   entecavir   0.5 mg Oral Daily   feeding supplement  1 Container Oral TID BM   [START ON 02/18/2024] fondaparinux  (ARIXTRA ) injection  2.5 mg Subcutaneous Q24H   lidocaine   1 patch  Transdermal QHS   sacubitril -valsartan   1 tablet Oral BID   sodium chloride  flush  3 mL Intravenous Q12H   spironolactone   25 mg Oral Daily   sulfamethoxazole -trimethoprim   1 tablet Oral Once per day on Monday Wednesday Friday   Continuous:  sodium chloride      piperacillin -tazobactam (ZOSYN )  IV 3.375 g (02/17/24 0432)   PRN:sodium chloride , acetaminophen  **OR** acetaminophen  (TYLENOL ) oral liquid 160 mg/5 mL **OR** acetaminophen , HYDROmorphone  (DILAUDID ) injection, melatonin, ondansetron  (ZOFRAN ) IV, polyethylene glycol, senna, sodium chloride  flush  Antibiotics: Anti-infectives (From admission, onward)    Start     Dose/Rate Route Frequency Ordered Stop   02/16/24 0900  sulfamethoxazole -trimethoprim  (BACTRIM  DS) 800-160 MG per tablet 1 tablet        1 tablet Oral Once per day on Monday Wednesday Friday 02/14/24 1241     02/15/24 1000  entecavir  (BARACLUDE ) tablet 0.5 mg        0.5 mg Oral Daily 02/14/24 1241     02/14/24 2200  acyclovir  (ZOVIRAX ) 200 MG/5ML suspension SUSP 800 mg  Status:  Discontinued        800 mg Oral 2 times daily 02/14/24 1241 02/14/24 1241   02/14/24 2200  acyclovir  (ZOVIRAX ) 200 MG capsule 800 mg        800 mg Oral 2 times daily 02/14/24 1242     02/13/24 1500  entecavir  (BARACLUDE ) tablet 0.5 mg  Status:  Discontinued        0.5 mg Per Tube Daily 02/12/24 1126 02/14/24 1241   02/13/24 1000  sulfamethoxazole -trimethoprim  (BACTRIM ) 200-40 MG/5ML suspension 20 mL  Status:  Discontinued        20 mL Per Tube Every M-W-F 02/11/24 1242 02/14/24 1241   02/11/24 1100  acyclovir  (ZOVIRAX ) 200 MG/5ML suspension SUSP 800 mg  Status:  Discontinued        800 mg Per Tube 2 times daily 02/11/24 1006 02/14/24 1241   02/11/24 1100  sulfamethoxazole -trimethoprim  (BACTRIM ) 200-40 MG/5ML suspension 20 mL  Status:  Discontinued        20 mL Per Tube Daily 02/11/24 1006 02/11/24 1242   02/11/24 1100  entecavir  (BARACLUDE ) tablet 0.5 mg  Status:  Discontinued        0.5 mg Per  Tube Every 24 hours 02/11/24 1006 02/12/24 1126   02/11/24 0945  piperacillin -tazobactam (ZOSYN ) IVPB 3.375 g        3.375 g 12.5 mL/hr over 240 Minutes Intravenous Every 8 hours 02/11/24 0942 02/18/24 1059   02/10/24 2115  cefTRIAXone  (ROCEPHIN ) 2 g in sodium chloride  0.9 % 100 mL IVPB  Status:  Discontinued        2 g 200 mL/hr over 30 Minutes Intravenous Every 24 hours 02/10/24 2109 02/11/24 0942       Objective:  Vital Signs  Vitals:   02/16/24 2038 02/17/24 0031 02/17/24 0440 02/17/24 0751  BP: (!) 153/94 120/87 (!) 126/90   Pulse: 98 93 81   Resp: 17 18 17 16   Temp: 98.7 F (37.1 C) 98.8 F (37.1 C) 98.9 F (37.2 C) 98.1 F (36.7 C)  TempSrc: Oral Oral Oral Oral  SpO2: 98% 100% 100%   Weight:   101 kg   Height:        Intake/Output Summary (Last 24  hours) at 02/17/2024 1008 Last data filed at 02/16/2024 1400 Gross per 24 hour  Intake 300 ml  Output --  Net 300 ml   Filed Weights   02/15/24 0449 02/16/24 0405 02/17/24 0440  Weight: 102.7 kg 101.5 kg 101 kg    General appearance: Awake alert.  In no distress Resp: Clear to auscultation bilaterally.  Normal effort Cardio: S1-S2 is normal regular.  No S3-S4.  No rubs murmurs or bruit GI: Abdomen is soft.  Nontender nondistended.  Bowel sounds are present normal.  No masses organomegaly   Lab Results:  Data Reviewed: I have personally reviewed following labs and reports of the imaging studies  CBC: Recent Labs  Lab 02/12/24 0327 02/13/24 0222 02/14/24 1134 02/15/24 0319 02/16/24 0807  WBC 4.8 3.5* 5.4 3.6* 3.4*  HGB 11.0* 10.2* 12.4* 11.5* 12.7*  HCT 32.2* 30.4* 35.9* 32.2* 36.2*  MCV 95.3 96.2 94.5 91.2 91.6  PLT 146* 144* 173 162 162    Basic Metabolic Panel: Recent Labs  Lab 02/11/24 1630 02/12/24 0327 02/13/24 0222 02/14/24 0723 02/15/24 0319 02/16/24 0807 02/17/24 0433  NA 146* 144 142 140 142 139 140  K 3.9 3.9 3.6 5.0 3.3* 4.0 4.0  CL 113* 112* 112* 109 104 103 103  CO2 23 22 21*  16* 25 26 24   GLUCOSE 88 94 101* 104* 112* 106* 100*  BUN 20 19 19 15 12 10 15   CREATININE 1.05 1.09 1.05 0.85 0.92 0.97 1.06  CALCIUM  8.5* 8.6* 8.8* 9.2 9.4 10.0 9.6  MG 2.0 2.2 2.2 2.0 1.8 2.2  --   PHOS 2.7 2.6 3.2 4.1 3.7  --   --     GFR: Estimated Creatinine Clearance: 101.1 mL/min (by C-G formula based on SCr of 1.06 mg/dL).  Liver Function Tests: Recent Labs  Lab 02/11/24 0937 02/12/24 0327 02/13/24 0222 02/14/24 0723 02/15/24 0319  AST 50* 37 23 27 24   ALT 81* 67* 48* 43 41  ALKPHOS 65 57 52 57 57  BILITOT 0.4 0.5 0.5 0.6 0.6  PROT 6.0* 5.3* 5.2* 5.6* 5.9*  ALBUMIN 4.0 3.5 3.3* 3.4* 3.7    Recent Labs  Lab 02/10/24 1809  LIPASE 86*   BNP (last 3 results) Recent Labs    02/10/24 1809  PROBNP 83.3   CBG: Recent Labs  Lab 02/13/24 1516 02/13/24 1927 02/13/24 2322 02/14/24 0307 02/14/24 0729  GLUCAP 80 115* 102* 114* 108*    Lipid Profile: No results for input(s): CHOL, HDL, LDLCALC, TRIG, CHOLHDL, LDLDIRECT in the last 72 hours.   Recent Results (from the past 240 hours)  Resp panel by RT-PCR (RSV, Flu A&B, Covid) Anterior Nasal Swab     Status: Abnormal   Collection Time: 02/10/24  6:29 PM   Specimen: Anterior Nasal Swab  Result Value Ref Range Status   SARS Coronavirus 2 by RT PCR POSITIVE (A) NEGATIVE Final   Influenza A by PCR NEGATIVE NEGATIVE Final   Influenza B by PCR NEGATIVE NEGATIVE Final    Comment: (NOTE) The Xpert Xpress SARS-CoV-2/FLU/RSV plus assay is intended as an aid in the diagnosis of influenza from Nasopharyngeal swab specimens and should not be used as a sole basis for treatment. Nasal washings and aspirates are unacceptable for Xpert Xpress SARS-CoV-2/FLU/RSV testing.  Fact Sheet for Patients: bloggercourse.com  Fact Sheet for Healthcare Providers: seriousbroker.it  This test is not yet approved or cleared by the [place]  FDA and has been  authorized for detection and/or diagnosis of SARS-CoV-2 by FDA  under an Emergency Use Authorization (EUA). This EUA will remain in effect (meaning this test can be used) for the duration of the COVID-19 declaration under Section 564(b)(1) of the Act, 21 U.S.C. section 360bbb-3(b)(1), unless the authorization is terminated or revoked.     Resp Syncytial Virus by PCR NEGATIVE NEGATIVE Final    Comment: (NOTE) Fact Sheet for Patients: bloggercourse.com  Fact Sheet for Healthcare Providers: seriousbroker.it  This test is not yet approved or cleared by the [place]  FDA and has been authorized for detection and/or diagnosis of SARS-CoV-2 by FDA under an Emergency Use Authorization (EUA). This EUA will remain in effect (meaning this test can be used) for the duration of the COVID-19 declaration under Section 564(b)(1) of the Act, 21 U.S.C. section 360bbb-3(b)(1), unless the authorization is terminated or revoked.  Performed at [facility], [address] [place], [place] [address]   Culture, Respiratory w Gram Stain     Status: None   Collection Time: 02/10/24  9:10 PM   Specimen: Tracheal Aspirate; Respiratory  Result Value Ref Range Status   Specimen Description TRACHEAL ASPIRATE  Final   Special Requests NONE  Final   Gram Stain   Final    FEW WBC PRESENT, PREDOMINANTLY PMN NO ORGANISMS SEEN Performed at [facility], [address], [place], [place] [address]    Culture FEW PSEUDOMONAS AERUGINOSA  Final   Report Status 02/14/2024 FINAL  Final   Organism ID, Bacteria PSEUDOMONAS AERUGINOSA  Final      Susceptibility   Pseudomonas aeruginosa - MIC*    MEROPENEM 1 SENSITIVE Sensitive     CIPROFLOXACIN 0.25 SENSITIVE Sensitive     IMIPENEM 2 SENSITIVE Sensitive     PIP/TAZO Value in next row Sensitive      8 SENSITIVEThis is a modified FDA-approved test that has been validated and its performance  characteristics determined by the reporting laboratory.  This laboratory is certified under the Clinical Laboratory Improvement Amendments CLIA as qualified to perform high complexity clinical laboratory testing.    CEFEPIME Value in next row Sensitive      8 SENSITIVEThis is a modified FDA-approved test that has been validated and its performance characteristics determined by the reporting laboratory.  This laboratory is certified under the Clinical Laboratory Improvement Amendments CLIA as qualified to perform high complexity clinical laboratory testing.    CEFTAZIDIME/AVIBACTAM Value in next row Sensitive      8 SENSITIVEThis is a modified FDA-approved test that has been validated and its performance characteristics determined by the reporting laboratory.  This laboratory is certified under the Clinical Laboratory Improvement Amendments CLIA as qualified to perform high complexity clinical laboratory testing.    CEFTOLOZANE/TAZOBACTAM Value in next row Sensitive      8 SENSITIVEThis is a modified FDA-approved test that has been validated and its performance characteristics determined by the reporting laboratory.  This laboratory is certified under the Clinical Laboratory Improvement Amendments CLIA as qualified to perform high complexity clinical laboratory testing.    TOBRAMYCIN Value in next row Sensitive      8 SENSITIVEThis is a modified FDA-approved test that has been validated and its performance characteristics determined by the reporting laboratory.  This laboratory is certified under the Clinical Laboratory Improvement Amendments CLIA as qualified to perform high complexity clinical laboratory testing.    CEFTAZIDIME Value in next row Sensitive      8 SENSITIVEThis is a modified FDA-approved test that has been validated and its performance characteristics determined  by the reporting laboratory.  This laboratory is certified under the Clinical Laboratory Improvement Amendments CLIA as qualified  to perform high complexity clinical laboratory testing.    * FEW PSEUDOMONAS AERUGINOSA  MRSA Next Gen by PCR, Nasal     Status: None   Collection Time: 02/10/24 10:23 PM   Specimen: Nasal Mucosa; Nasal Swab  Result Value Ref Range Status   MRSA by PCR Next Gen NOT DETECTED NOT DETECTED Final    Comment: (NOTE) The GeneXpert MRSA Assay (FDA approved for NASAL specimens only), is one component of a comprehensive MRSA colonization surveillance program. It is not intended to diagnose MRSA infection nor to guide or monitor treatment for MRSA infections. Test performance is not FDA approved in patients less than 55 years old. Performed at [facility], [address], [place], [place] [address]   Culture, blood (Routine X 2) w Reflex to ID Panel     Status: None   Collection Time: 02/10/24 11:14 PM   Specimen: BLOOD LEFT ARM  Result Value Ref Range Status   Specimen Description BLOOD LEFT ARM  Final   Special Requests   Final    BOTTLES DRAWN AEROBIC AND ANAEROBIC Blood Culture adequate volume   Culture   Final    NO GROWTH 5 DAYS Performed at [facility], [address], [place], [place] [address]    Report Status 02/15/2024 FINAL  Final  Culture, blood (Routine X 2) w Reflex to ID Panel     Status: Abnormal   Collection Time: 02/10/24 11:25 PM   Specimen: BLOOD LEFT HAND  Result Value Ref Range Status   Specimen Description BLOOD LEFT HAND  Final   Special Requests   Final    BOTTLES DRAWN AEROBIC AND ANAEROBIC Blood Culture adequate volume   Culture  Setup Time   Final    GRAM POSITIVE COCCI ANAEROBIC BOTTLE ONLY CRITICAL RESULT CALLED TO, READ BACK BY AND VERIFIED WITH: PHARMD E. REOME [id number] @ 1939 FH    Culture (A)  Final    STAPHYLOCOCCUS EPIDERMIDIS THE SIGNIFICANCE OF ISOLATING THIS ORGANISM FROM A SINGLE SET OF BLOOD CULTURES WHEN MULTIPLE SETS ARE DRAWN IS UNCERTAIN. PLEASE NOTIFY THE MICROBIOLOGY DEPARTMENT WITHIN ONE WEEK IF SPECIATION AND SENSITIVITIES  ARE REQUIRED. Performed at [facility], [address], [place], [place] [address]    Report Status 02/13/2024 FINAL  Final  Blood Culture ID Panel (Reflexed)     Status: Abnormal   Collection Time: 02/10/24 11:25 PM  Result Value Ref Range Status   Enterococcus faecalis NOT DETECTED NOT DETECTED Final   Enterococcus Faecium NOT DETECTED NOT DETECTED Final   Listeria monocytogenes NOT DETECTED NOT DETECTED Final   Staphylococcus species DETECTED (A) NOT DETECTED Final    Comment: CRITICAL RESULT CALLED TO, READ BACK BY AND VERIFIED WITH: PHARMD E. REOME [id number] @ 1939 FH    Staphylococcus aureus (BCID) NOT DETECTED NOT DETECTED Final   Staphylococcus epidermidis DETECTED (A) NOT DETECTED Final    Comment: Methicillin (oxacillin) resistant coagulase negative staphylococcus. Possible blood culture contaminant (unless isolated from more than one blood culture draw or clinical case suggests pathogenicity). No antibiotic treatment is indicated for blood  culture contaminants. CRITICAL RESULT CALLED TO, READ BACK BY AND VERIFIED WITH: PHARMD E. REOME [id number] @ 1939 FH    Staphylococcus lugdunensis NOT DETECTED NOT DETECTED Final   Streptococcus species NOT DETECTED NOT DETECTED Final   Streptococcus agalactiae NOT DETECTED NOT DETECTED Final  Streptococcus pneumoniae NOT DETECTED NOT DETECTED Final   Streptococcus pyogenes NOT DETECTED NOT DETECTED Final   A.calcoaceticus-baumannii NOT DETECTED NOT DETECTED Final   Bacteroides fragilis NOT DETECTED NOT DETECTED Final   Enterobacterales NOT DETECTED NOT DETECTED Final   Enterobacter cloacae complex NOT DETECTED NOT DETECTED Final   Escherichia coli NOT DETECTED NOT DETECTED Final   Klebsiella aerogenes NOT DETECTED NOT DETECTED Final   Klebsiella oxytoca NOT DETECTED NOT DETECTED Final   Klebsiella pneumoniae NOT DETECTED NOT DETECTED Final   Proteus species NOT DETECTED NOT DETECTED Final   Salmonella species NOT DETECTED NOT  DETECTED Final   Serratia marcescens NOT DETECTED NOT DETECTED Final   Haemophilus influenzae NOT DETECTED NOT DETECTED Final   Neisseria meningitidis NOT DETECTED NOT DETECTED Final   Pseudomonas aeruginosa NOT DETECTED NOT DETECTED Final   Stenotrophomonas maltophilia NOT DETECTED NOT DETECTED Final   Candida albicans NOT DETECTED NOT DETECTED Final   Candida auris NOT DETECTED NOT DETECTED Final   Candida glabrata NOT DETECTED NOT DETECTED Final   Candida krusei NOT DETECTED NOT DETECTED Final   Candida parapsilosis NOT DETECTED NOT DETECTED Final   Candida tropicalis NOT DETECTED NOT DETECTED Final   Cryptococcus neoformans/gattii NOT DETECTED NOT DETECTED Final   Methicillin resistance mecA/C DETECTED (A) NOT DETECTED Final    Comment: CRITICAL RESULT CALLED TO, READ BACK BY AND VERIFIED WITH: MAYA DRAFTS REOME [id number] @ 1940 FH Performed at [facility], [address], [place], [place] [address]       Radiology Studies: No results found.     LOS: 7 days   Nadiah Corbit [employer] on [email or web address]  02/17/2024, 10:08 AM   "

## 2024-02-17 NOTE — Plan of Care (Signed)
" °  Problem: Education: Goal: Ability to manage disease process will improve Outcome: Progressing   Problem: Cardiac: Goal: Ability to achieve and maintain adequate cardiopulmonary perfusion will improve Outcome: Progressing   Problem: Neurologic: Goal: Promote progressive neurologic recovery Outcome: Progressing   Problem: Skin Integrity: Goal: Risk for impaired skin integrity will be minimized. Outcome: Progressing   Problem: Education: Goal: Knowledge of risk factors and measures for prevention of condition will improve Outcome: Progressing   Problem: Coping: Goal: Psychosocial and spiritual needs will be supported Outcome: Progressing   Problem: Respiratory: Goal: Will maintain a patent airway Outcome: Progressing Goal: Complications related to the disease process, condition or treatment will be avoided or minimized Outcome: Progressing   Problem: Education: Goal: Knowledge of General Education information will improve Description: Including pain rating scale, medication(s)/side effects and non-pharmacologic comfort measures Outcome: Progressing   Problem: Health Behavior/Discharge Planning: Goal: Ability to manage health-related needs will improve Outcome: Progressing   Problem: Clinical Measurements: Goal: Ability to maintain clinical measurements within normal limits will improve Outcome: Progressing Goal: Will remain free from infection Outcome: Progressing Goal: Diagnostic test results will improve Outcome: Progressing Goal: Respiratory complications will improve Outcome: Progressing Goal: Cardiovascular complication will be avoided Outcome: Progressing   Problem: Activity: Goal: Risk for activity intolerance will decrease Outcome: Progressing   Problem: Nutrition: Goal: Adequate nutrition will be maintained Outcome: Progressing   Problem: Coping: Goal: Level of anxiety will decrease Outcome: Progressing   Problem: Elimination: Goal: Will not  experience complications related to bowel motility Outcome: Progressing Goal: Will not experience complications related to urinary retention Outcome: Progressing   Problem: Pain Managment: Goal: General experience of comfort will improve and/or be controlled Outcome: Progressing   Problem: Safety: Goal: Ability to remain free from injury will improve Outcome: Progressing   Problem: Skin Integrity: Goal: Risk for impaired skin integrity will decrease Outcome: Progressing   Problem: Education: Goal: Understanding of CV disease, CV risk reduction, and recovery process will improve Outcome: Progressing Goal: Individualized Educational Video(s) Outcome: Progressing   Problem: Activity: Goal: Ability to return to baseline activity level will improve Outcome: Progressing   Problem: Cardiovascular: Goal: Ability to achieve and maintain adequate cardiovascular perfusion will improve Outcome: Progressing Goal: Vascular access site(s) Level 0-1 will be maintained Outcome: Progressing   Problem: Health Behavior/Discharge Planning: Goal: Ability to safely manage health-related needs after discharge will improve Outcome: Progressing   "

## 2024-02-17 NOTE — Consult Note (Cosign Needed)
 "  EP progress note   Patient ID: Bradley Hunt MRN: [id number]; DOB: 1970-05-13  Admit date: 02/10/2024 Date of Consult: 02/17/2024  PCP:  Sim [name] CROME, MD   [facility] Providers Cardiologist:  None     Patient Profile: Bradley Hunt is a 54 y.o. 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    who is being seen 02/17/2024 for the evaluation of OOH cardiac arrest at the request of Dr. Cherrie.  History of Present Illness: Mr. Zoll has hx of HFmrEF (40-45%) saw cardiology at [facility] Oct 2025 for a visit with hypotension as they were advancing his GDMT, reporting his LVEF 40-45%, his medications adjusted, held while further evaluation into his low BP was underway  In reviw of record, was at home, his wife heard a loud thump > found him about 10 minutes later unresponsive, EMS called and started CPR > shockable rhythm > x1   In my review of EMS Prior to their arrival FR was on scene and had delivered one shock via AED, he was unresponsive, BVM oxygenation > CPR continued > Lucas > ROSC > intubated , no ACLS meds SR w/PVCs reported w/ROSC Patient remained unresponsive EMS tracings personally reviewed, SR  Record notes GI illness preceding arrest w/diarrhea, and found to have norovirus, COVID, and rhinovirus.  His presenting K+ was 2.8 (suspect 2/2 illness/diarrhea) Lidocaine  gtt started 1/15  > amiodarone  for NSVTs, PVCs LVEF 20-25% Able to be extubated > though remains with anoxic brain injury > improved but not at basleine Amiodarone  started for frequent PVCs R/LHC today with no CAD, EF 45-50% by v-gram (35% by echo)  EP is asked on board for recommendations regarding +/-  ICD  LABS  1/13 COVID + Blood Cx 1of 2 staph epi (the other negative 5 days)  K+ 2.8 > 2.8 > 2.6 > 3.4 > >> 3.7 > 4.0 Mag 1.5 > 2.3 >>> 2.2  Today: Coox 62 BUN/Creat 15/1.06 H/H 12/37  1/18 WBC 3.4    Past Medical History:  Diagnosis Date   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   History of chemotherapy    History of radiation therapy    Multiple myeloma not having achieved remission (HCC) 06/25/2015   Numbness    lower extermities bilat     Past Surgical History:  Procedure Laterality Date   ANTERIOR CRUCIATE LIGAMENT REPAIR     IR FLUORO GUIDE PORT INSERTION RIGHT  05/16/2016   IR REMOVAL TUN ACCESS W/ PORT W/O FL MOD SED  07/23/2017   IR US  GUIDE VASC ACCESS RIGHT  05/16/2016   LAMINECTOMY N/A 06/25/2015   Procedure: Thoracic six LAMINECTOMY RESECTION FOR TUMOR;  Surgeon: Gerldine Maizes, MD;  Location: [facility];  Service: Neurosurgery;  Laterality: N/A;     Home Medications:  Prior to Admission medications  Medication Sig Start Date End Date Taking? Authorizing Provider  acetaminophen  (TYLENOL ) 500 MG tablet Take 500 mg by mouth at bedtime as needed for moderate pain (pain score 4-6) or mild pain (pain score 1-3).   Yes [provider]  acyclovir  (ZOVIRAX ) 400 MG tablet Take 1 tablet (400 mg total) by mouth daily. Patient taking differently: Take 400 mg by mouth daily. Takes 2 tablets2  times per day 03/18/23  Yes Gorsuch, Ni, MD  azithromycin  (ZITHROMAX ) 500 MG tablet Take 500 mg by mouth daily. 02/06/24  Yes [provider]  calcium  carbonate (TUMS - DOSED IN MG ELEMENTAL CALCIUM ) 500 MG chewable tablet Chew 1 tablet by mouth 3 (three) times daily.   Yes [provider]  carvedilol  (COREG ) 3.125 MG tablet Take 3.125 mg by mouth 2 (two) times daily. 10/29/23  Yes [provider]  cholecalciferol (VITAMIN D3) 25 MCG (1000 UT) tablet Take 1,000 Units by mouth daily.   Yes [provider]  empagliflozin (JARDIANCE) 10 MG  TABS tablet Take 10 mg by mouth daily. 09/01/23  Yes [provider]  folic acid (FOLVITE) 1 MG tablet Take 1 mg by mouth daily. 11/14/23  Yes [provider]  losartan  (COZAAR ) 50 MG tablet TAKE 1 TABLET(50 MG) BY MOUTH DAILY 06/25/23  Yes Gorsuch, Ni, MD  Multiple Vitamin (MULTI-VITAMIN) tablet Take 1 tablet by mouth daily. 11/14/23  Yes [provider]  ondansetron  (ZOFRAN ) 8 MG tablet Take 1 tablet (8 mg total) by mouth every 8 (eight) hours as needed for nausea or vomiting. 11/08/21  Yes Gorsuch, Ni, MD  spironolactone  (ALDACTONE ) 25 MG tablet Take 25 mg by mouth daily. 10/03/23 10/02/24 Yes [provider]  sulfamethoxazole -trimethoprim  (BACTRIM  DS) 800-160 MG tablet Take 1 tablet by mouth 3 (three) times a week. 11/14/23 05/12/24 Yes [provider]  traMADol (ULTRAM) 50 MG tablet Take 50 mg by mouth every 8 (eight) hours as needed for severe pain (pain score 7-10).   Yes [provider]  ENTRESTO  49-51 MG Take 1 tablet by mouth 2 (two) times daily. Patient not taking: Reported on 02/11/2024 10/10/23   [provider]    Scheduled Meds:  acyclovir   800 mg Oral BID   amiodarone   200 mg Oral BID   carvedilol   3.125 mg Oral BID WC   entecavir   0.5 mg Oral Daily   feeding supplement  1 Container Oral TID BM   [START ON 02/18/2024] fondaparinux  (ARIXTRA ) injection  2.5 mg Subcutaneous Q24H   lidocaine   1 patch Transdermal QHS   sacubitril -valsartan   1 tablet Oral BID   sodium chloride  flush  3 mL Intravenous Q12H   spironolactone   25 mg Oral Daily   sulfamethoxazole -trimethoprim   1 tablet Oral Once per day on Monday Wednesday Friday   Continuous Infusions:  sodium chloride      piperacillin -tazobactam (ZOSYN )  IV 3.375 g (02/17/24 0432)   PRN Meds: sodium chloride , acetaminophen  **OR** acetaminophen  (TYLENOL ) oral liquid 160 mg/5 mL **OR** acetaminophen , acetaminophen , hydrALAZINE , HYDROmorphone  (DILAUDID ) injection, labetalol ,  melatonin, ondansetron  (ZOFRAN ) IV, ondansetron  (ZOFRAN ) IV, polyethylene glycol, senna, sodium chloride  flush  Allergies:   Allergies[1]  Social History:   Social History   Socioeconomic History   Marital status: Married    Spouse name: Not on file   Number of children: 0   Years of education: Not on file   Highest education level: Not on file  Occupational History   Not on file  Tobacco Use   Smoking status: Former    Current packs/day: 0.00    Average packs/day: 1 pack/day for 10.0 years (10.0 ttl pk-yrs)    Types: Cigarettes    Start date: 01/29/1987    Quit date: 01/28/1997    Years since quitting: 27.0   Smokeless tobacco: Never  Vaping Use   Vaping status: Never Used  Substance and Sexual Activity   Alcohol use: No   Drug use:  No   Sexual activity: Yes    Comment: friend Amadou next of kin. 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  Other Topics Concern   Not on file  Social History Narrative   Not on file   Social Drivers of Health   Tobacco Use: Medium Risk (02/14/2024)   Patient History    Smoking Tobacco Use: Former    Smokeless Tobacco Use: Never    Passive Exposure: Not on Actuary Strain: Not on file  Food Insecurity: No Food Insecurity (02/11/2024)   Epic    Worried About Programme Researcher, Broadcasting/film/video in the Last Year: Never true    Ran Out of Food in the Last Year: Never true  Transportation Needs: No Transportation Needs (02/11/2024)   Epic    Lack of Transportation (Medical): No    Lack of Transportation (Non-Medical): No  Physical Activity: Not on file  Stress: Not on file  Social Connections: Socially Integrated (02/11/2024)   Social Connection and Isolation Panel    Frequency of Communication with Friends and Family: More than three times a week    Frequency of Social Gatherings with Friends and Family: More than three times a week    Attends Religious Services: 1 to 4 times per year    Active Member of [employer] or Organizations: Yes    Attends Occupational Hygienist Meetings: More than 4 times per year    Marital Status: Living with partner  Intimate Partner Violence: Not At Risk (02/11/2024)   Epic    Fear of Current or Ex-Partner: No    Emotionally Abused: No    Physically Abused: No    Sexually Abused: No  Depression (PHQ2-9): Low Risk (11/04/2023)   Depression (PHQ2-9)    PHQ-2 Score: 0  Alcohol Screen: Not on file  Housing: Low Risk (02/11/2024)   Epic    Unable to Pay for Housing in the Last Year: No    Number of Times Moved in the Last Year: 0    Homeless in the Last Year: No  Utilities: Not At Risk (02/11/2024)   Epic    Threatened with loss of utilities: No  Health Literacy: Not on file    Family History:  Family History  Problem Relation Age of Onset   Cancer Neg Hx      ROS:  Please see the history of present illness.  All other ROS reviewed and negative.     Physical Exam/Data: Vitals:   02/17/24 1145 02/17/24 1150 02/17/24 1155 02/17/24 1205  BP: (!) 140/96   130/88  Pulse: 86 (!) 0 (!) 0   Resp: 18   17  Temp:    98.3 F (36.8 C)  TempSrc:    Oral  SpO2: 98%     Weight:      Height:        Intake/Output Summary (Last 24 hours) at 02/17/2024 1254 Last data filed at 02/16/2024 1400 Gross per 24 hour  Intake 300 ml  Output --  Net 300 ml      02/17/2024    4:40 AM 02/16/2024    4:05 AM 02/15/2024    4:49 AM  Last 3 Weights  Weight (lbs) 222 lb 11.2 oz 223 lb 12.8 oz 226 lb 8 oz  Weight (kg) 101.016 kg 101.515 kg 102.74 kg     Body mass index is 28.59 kg/m.  General:  Well nourished, well developed, in no acute distress HEENT: normal Neck: no JVD Vascular: No carotid  bruits; Distal pulses 2+ bilaterally Cardiac:  RRR; no murmurs, gallops or rubs Lungs: CTA b/l, no wheezing, rhonchi or rales  Abd: soft, nontender Ext: no edema Musculoskeletal:  No deformities Skin: warm and dry  Neuro:  no focal abnormalities noted Psych:  Normal affect   EKG:  The EKG was personally reviewed and  demonstrates:    ST 122bpm, PVCs, couplet, no acute ischemic changes  Telemetry:  Telemetry was personally reviewed and demonstrates:   SR, 90's  Relevant CV Studies:  02/16/14: R/LHC Findings:   Ao = 119/91 (105) LV = 123/3 RA = 2 RV = 21/5 PA = 26/9 (14) PCW = 3 Fick cardiac output/index = 5.8/2.6 Thermo CO/CI = 4.5/2.0 PVR = 2.6 (Fick) 2.0 (TD) Ao sat = 95% PA sat = 62%, 69% PAPi = 8.5   Assessment: 1. Normal coronaries 2. EF 45-50% by v-gram (35% by echo) 3. Low filling pressures 4. Normal outputs by Fick    02/11/24: TTE  1. Nodular thickening of aortic valve noted; no AI.   2. Left ventricular ejection fraction, by estimation, is 20 to 25%. The  left ventricle has severely decreased function. The left ventricle  demonstrates global hypokinesis. There is mild left ventricular  hypertrophy. Left ventricular diastolic parameters   are consistent with Grade I diastolic dysfunction (impaired relaxation).   3. Right ventricular systolic function is normal. The right ventricular  size is normal. There is normal pulmonary artery systolic pressure.   4. The mitral valve is normal in structure. Trivial mitral valve  regurgitation. No evidence of mitral stenosis.   5. The aortic valve is tricuspid. Aortic valve regurgitation is not  visualized. Aortic valve sclerosis is present, with no evidence of aortic  valve stenosis.   6. The inferior vena cava is normal in size with greater than 50%  respiratory variability, suggesting right atrial pressure of 3 mmHg.   10/31/24: TTE ([facility]) SUMMARY  The left ventricular size is normal with mild concentric hypertrophy.  Left ventricular systolic function is moderately reduced. LV ejection fraction = 30-35%.  There is moderate global hypokinesis of the left ventricle.  Left ventricular filling pattern is prolonged relaxation.  LV longitudinal strain imaging showed apical sparing pattern.  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  The right ventricle is normal in size and function.  The left [facility] is mildly dilated.  There is aortic valve sclerosis.  The aortic sinus is normal size.  The IVC is normal in size with an inspiratory collapse of greater than 50%, suggesting normal right  atrial pressure.  There is no pericardial effusion.  Compared to the prior study, there is probably no significant change   09/30/23 TTE ([facility]) SUMMARY  The left ventricular size is normal.  Mild left ventricular hypertrophy.  LV ejection fraction = 40-45%.  Left ventricular systolic function is mild to moderately reduced.  Left ventricular filling pattern is prolonged relaxation.  The right ventricle is normal in size and function.  The left [facility] is mildly dilated.  There was insufficient TR detected to calculate RV systolic pressure.  The IVC is normal in size with an inspiratory collapse of greater than 50%, suggesting normal right  atrial pressure.  There is no significant valvular stenosis or regurgitation.  There is no pericardial effusion.  Compared to the prior study, there is probably no significant change.   Laboratory Data: High Sensitivity Troponin:  No results for input(s): TROPONINIHS in the last 720 hours.  Recent Labs  Lab 02/10/24 1809 02/10/24 2104  TRNPT 25* 401*      Chemistry Recent Labs  Lab 02/14/24 0723 02/15/24 0319 02/16/24 0807 02/17/24 0433 02/17/24 1124 02/17/24 1131 02/17/24 1143  NA 140 142 139 140 141 140 140  K 5.0 3.3* 4.0 4.0 3.4* 3.8 3.7  CL 109 104 103 103  --   --   --   CO2 16* 25 26 24   --   --   --   GLUCOSE 104* 112* 106* 100*  --   --   --   BUN 15 12 10 15   --   --   --   CREATININE 0.85 0.92 0.97 1.06  --   --   --   CALCIUM  9.2 9.4 10.0 9.6  --   --   --   MG 2.0 1.8 2.2  --   --   --   --   GFRNONAA >60 >60 >60 >60  --   --   --   ANIONGAP 14 13 10 13   --   --   --     Recent Labs  Lab 02/13/24 0222 02/14/24 0723  02/15/24 0319  PROT 5.2* 5.6* 5.9*  ALBUMIN 3.3* 3.4* 3.7  AST 23 27 24   ALT 48* 43 41  ALKPHOS 52 57 57  BILITOT 0.5 0.6 0.6   Lipids  Recent Labs  Lab 02/14/24 0723  TRIG 156*    Hematology Recent Labs  Lab 02/14/24 1134 02/15/24 0319 02/16/24 0807 02/17/24 1124 02/17/24 1131 02/17/24 1143  WBC 5.4 3.6* 3.4*  --   --   --   RBC 3.80* 3.53* 3.95*  --   --   --   HGB 12.4* 11.5* 12.7* 12.2* 13.3 12.6*  HCT 35.9* 32.2* 36.2* 36.0* 39.0 37.0*  MCV 94.5 91.2 91.6  --   --   --   MCH 32.6 32.6 32.2  --   --   --   MCHC 34.5 35.7 35.1  --   --   --   RDW 12.7 12.2 12.0  --   --   --   PLT 173 162 162  --   --   --    Thyroid  Recent Labs  Lab 02/10/24 1809  TSH 1.850    BNP Recent Labs  Lab 02/10/24 1809  PROBNP 83.3    DDimer No results for input(s): DDIMER in the last 168 hours.  Radiology/Studies:  CARDIAC CATHETERIZATION Result Date: 02/17/2024   The left ventricular ejection fraction is 45-50% by visual estimate. Findings: Ao = 119/91 (105) LV = 123/3 RA = 2 RV = 21/5 PA = 26/9 (14) PCW = 3 Fick cardiac output/index = 5.8/2.6 Thermo CO/CI = 4.5/2.0 PVR = 2.6 (Fick) 2.0 (TD) Ao sat = 95% PA sat = 62%, 69% PAPi = 8.5 Assessment: 1. Normal coronaries 2. EF 45-50% by v-gram (35% by echo) 3. Low filling pressures 4. Normal outputs by Fick Plan/Discussion: Medical therapy Daniel Bensimhon, MD 11:57 AM    Assessment and Plan:  Cardiac arrest Bystander CPR AED shock  Has had hx of reduced LVEF 30's-40's percent by a couple echos Here (by Dr. Nelle eval) ~ 30-35%, by cath today 40-45% QT on presentation was OK He was markedly hypokalemic, hypomagnesemic as well. perhaps 2/2 arrest, GI illness, diarrhea Sick with norovirus, COVID, and rhinovirus.  Further complicated by his multiple myeloma hx > thought to be in relapse 7/25 s/p CAR-T 9/25 followed by fludarabine /cytoxan  lymphodepletion (Previously  treated with bortezomib , lenalidomide  and  dexamethasone )  He has suffered some degree of anoxic brain injury > improving by notes  In review with [facility] > cytoxin is/can be cardiotoxic (pericarditis, HF, arrhythmias by their review) Looks like he started and completed therapy in Sept 2025 In review of his last oncology note 02/03/24 The pt reported feeling poorly with ~ 2 weeks of diarrhea after a trip to [place] where he was worried he may have gotten sick Looks like they did a large infectious panel + Enteroaggregative coli + Norovoirus + rotavirus  The pt was able to tell me that he fainted in the bathroom, is here because he was told he had a heart attack. Does not know the details of the day of his event. Feels well, no CP, SOB I spoke to his wife on the phone Reports that he had a fleeting fainting episode ~ 1 mo ago, no evaluation saught, was very brief, no symptoms otherwise She confirms he is no longer/actively on chemo  I think there is a reversible cause with his marked hypokalemia (and low mag as well),  Will d/w Dr. Nancey, ? Cytoxin.  Unclear duration that this may have impact on his cardiac picture His LVEF is improved to 40-45% Given all of this, active/recent illnesses Suspect we will recommend life vest and out patient follow up  ADDEND: 02/19/23 Dr. Nancey saw/examined the patient yesterday, pending his attestation. Plan from EP is as above there is a reversible cause with his marked hypokalemia (and low mag as well),  His LVEF is improved to 40-45% Given all of this, active/recent illnesses  recommend life vest and out patient follow up Charlies Arthur, PA-C  For questions or updates, please contact [facility] Please consult [email or web address] for contact info under   Signed, Charlies Macario Arthur, PA-C  02/17/2024 12:54 PM     [1]  Allergies Allergen Reactions   Daratumumab  Shortness Of Breath, Diarrhea, Itching and Hypertension    Only with IV daratumumab    Heparin  Other (See Comments)    For  religious purposes, does not agree to Heparin  or other pork products   Porcine (Pork) Protein-Containing Drug Products     Religious reasons   "

## 2024-02-18 ENCOUNTER — Inpatient Hospital Stay (HOSPITAL_COMMUNITY)

## 2024-02-18 DIAGNOSIS — I469 Cardiac arrest, cause unspecified: Secondary | ICD-10-CM | POA: Diagnosis not present

## 2024-02-18 LAB — BASIC METABOLIC PANEL WITH GFR
Anion gap: 13 (ref 5–15)
BUN: 18 mg/dL (ref 6–20)
CO2: 25 mmol/L (ref 22–32)
Calcium: 9.7 mg/dL (ref 8.9–10.3)
Chloride: 102 mmol/L (ref 98–111)
Creatinine, Ser: 1.13 mg/dL (ref 0.61–1.24)
GFR, Estimated: >60 mL/min
Glucose, Bld: 104 mg/dL — ABNORMAL HIGH (ref 70–99)
Potassium: 3.9 mmol/L (ref 3.5–5.1)
Sodium: 139 mmol/L (ref 135–145)

## 2024-02-18 LAB — CBC
HCT: 38.8 % — ABNORMAL LOW (ref 39.0–52.0)
Hemoglobin: 13.5 g/dL (ref 13.0–17.0)
MCH: 32.3 pg (ref 26.0–34.0)
MCHC: 34.8 g/dL (ref 30.0–36.0)
MCV: 92.8 fL (ref 80.0–100.0)
Platelets: 205 K/uL (ref 150–400)
RBC: 4.18 MIL/uL — ABNORMAL LOW (ref 4.22–5.81)
RDW: 12.1 % (ref 11.5–15.5)
WBC: 4.9 K/uL (ref 4.0–10.5)
nRBC: 0 % (ref 0.0–0.2)

## 2024-02-18 NOTE — Progress Notes (Signed)
 Physical Therapy Treatment Patient Details Name: Bradley Hunt MRN: [id number] DOB: 12-17-1970 Today's Date: 02/18/2024   History of Present Illness The pt is a 54 yo male presenting 1/13 after being found unresponsive by friends at work, CPR by EMS with ROSC after 8 min, unknown total downtime. Intubated on arrival, admission complicated by recurrent posturing episodes vs seizure? (EEG negative) as well as testing positive for Covid, Norovirus, and rotavirus. Extubated 1/16. PMH includes: multiple myeloma, previous bony metastasis, chemo and radiation therapy, and previous pancytopenia.   PT Comments  Pt received in supine and agreeable to PT session. Pt continues to progress well towards acute PT goals. Pt was A&Ox4 after reorientation provided this a.m. by OT. Reported he was still having difficulty remembering all of the events leading up to admission. Difficulty also noted with multi-tasking with multimodal cueing needed to perform tasks. Focused session on cognitive tasks while ambulating and dynamic balance activities. Hunt/supervision for all mobility with no AD. Pt would occasionally take lateral side steps when performing dynamic activities, however, no physical assist needed to correct slight unsteadiness. Pt is looking forward to going home with current recommendations for OP PT. Will continue to follow acutely.    If plan is discharge home, recommend the following: Assistance with cooking/housework;Direct supervision/assist for medications management;Direct supervision/assist for financial management;Assist for transportation;Supervision due to cognitive status   Can travel by private vehicle      Yes  Equipment Recommendations  None recommended by PT       Precautions / Restrictions Precautions Precautions: Fall Recall of Precautions/Restrictions: Impaired Precaution/Restrictions Comments: watch BP (HTN) Restrictions Weight Bearing Restrictions Per Provider Order: No      Mobility  Bed Mobility Overal bed mobility: Modified Independent   Transfers Overall transfer level: Needs assistance Equipment used: None Transfers: Sit to/from Stand Sit to Stand: Supervision     Ambulation/Gait Ambulation/Gait assistance: Supervision Gait Distance (Feet): 200 Feet Assistive device: None Gait Pattern/deviations: Step-through pattern, Decreased stride length Gait velocity: decreased    General Gait Details: steady with no challenges to balance and no UE support. Occasional lateral side-steps taken with dynamic balance activities with no physical assist needed to correct      Balance Overall balance assessment: Needs assistance Sitting-balance support: Feet supported Sitting balance-Leahy Scale: Good    Standing balance support: Bilateral upper extremity supported, No upper extremity supported Standing balance-Leahy Scale: Good    High Level Balance Comments: Performed head turns (vertical/horizontal), stepping over obstacles, stepping around obstacles, and picking obstacles off the ground       Communication Communication Communication: No apparent difficulties  Cognition Arousal: Alert Behavior During Therapy: WFL for tasks assessed/performed   PT - Cognitive impairments: Awareness, Memory, Attention, Initiation, Sequencing, Problem solving, Safety/Judgement    Following commands: Impaired Following commands impaired: Only follows one step commands consistently, Follows multi-step commands inconsistently    Cueing Cueing Techniques: Verbal cues, Gestural cues  Exercises Other Exercises Other Exercises: Cognitive tasks- counting backwards, searching for colors/words on posters        Pertinent Vitals/Pain Pain Assessment Pain Assessment: No/denies pain     PT Goals (current goals can now be found in the care plan section) Acute Rehab PT Goals PT Goal Formulation: With patient Time For Goal Achievement: 02/28/24 Potential to Achieve  Goals: Good Progress towards PT goals: Progressing toward goals    Frequency    Min 3X/week       AM-PAC PT 6 Clicks Mobility   Outcome Measure  Help needed turning  from your back to your side while in a flat bed without using bedrails?: None Help needed moving from lying on your back to sitting on the side of a flat bed without using bedrails?: None Help needed moving to and from a bed to a chair (including a wheelchair)?: None Help needed standing up from a chair using your arms (e.g., wheelchair or bedside chair)?: None Help needed to walk in hospital room?: A Little Help needed climbing 3-5 steps with a railing? : A Little 6 Click Score: 22    End of Session   Activity Tolerance: Patient tolerated treatment well Patient left: with call bell/phone within reach;in bed Nurse Communication: Mobility status PT Visit Diagnosis: Unsteadiness on feet (R26.81);Difficulty in walking, not elsewhere classified (R26.2);Ataxic gait (R26.0)     Time: 8586-8563 PT Time Calculation (min) (ACUTE ONLY): 23 min  Charges:    $Gait Training: 8-22 mins $Neuromuscular Re-education: 8-22 mins PT General Charges $$ ACUTE PT VISIT: 1 Visit                    Kate ORN, PT, DPT Secure Chat Preferred  Rehab Office [phone]    Kate BRAVO Wendolyn 02/18/2024, 3:23 PM

## 2024-02-18 NOTE — Progress Notes (Signed)
 " PROGRESS NOTE  Bradley Hunt  FMW:[id number] DOB: 06-16-1970 DOA: 02/10/2024 PCP: Sim [name] CROME, MD   Brief Narrative: Patient is a 54 year old male with history of multiple myeloma, HFrEF who was brought unresponsive to the emergency department.  EMS found him pulseless, CPR initiated.  Started  rhythm identified and he was shocked x 1.  Given CPR for 10 minutes.  Intubated in the ED for airway protection, admitted under PCCM service.  Cardiology/EP following.  Has significantly improved clinically.  Assessment & Plan:  Principal Problem:   Cardiac arrest [facility]) Active Problems:   On mechanically assisted ventilation (HCC)   Acute HFrEF (heart failure with reduced ejection fraction) (HCC)   Cardiac arrest/ventricular tachycardia: Unresponsive. EMS found him pulseless, CPR initiated.  Shockable    rhythm identified and he was shocked x 1.  Given CPR for 10 minutes.  Cardiac arrest potentially secondary to electrolyte abnormalities.  Potassium was 2.8 on admission.  Currently hemodynamically stable.  Chronic HFrEF: Echo of 20-35%.  CHF team following.  Underwent cardiac cath.  EP following.  Consideration for LifeVest versus ICD.  Currently on amiodarone , carvedilol , Entresto , spironolactone .  Holding SGLT2i due to low volume status.  Limited Echo being done  Acute hypoxic respiratory failure/Pseudomonas pneumonia: Intubated on arrival.  Currently extubated.  Currently on room air.  Completed antibiotics course, treated with injection.  Blood cultures showed Staph epidermidis, likely contamination. COVID-19/rhino virus incidentally came out to be positive.  Asymptomatic.  Denies shortness of breath or cough today.  Concern for seizure  activity/anoxic brain injury: underwent EEG.  Neurology does not think that he needs to be on antiepileptics.  Keppra  discontinued.  Mentation improving, mostly oriented  Norovirus/rotavirus positive: Diarrhea resolved.  Did recently travel to  [place].  Normocytic anemia: Currently hemoglobin stable  Severe hypokalemia: Thought to be the  culprit for cardiac arrest.  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 S/p autologous stem cells 2018.  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  Due to his immunocompromise status, he has been on  vericlude, acyclovir , Bactrim  MWF, entecavir .Plans for IVIG as OP on hold  Patient seen by PT/ OT, outpatient follow-up recommended  Nutrition Problem: Increased nutrient needs Etiology: acute illness    DVT prophylaxis:fondaparinux  (ARIXTRA ) injection 2.5 mg Start: 02/18/24 1000 SCDs Start: 02/10/24 2029     Code Status: Full Code  Family Communication: None at the bedside  Patient status:Inpatient  Patient is from :Home  Anticipated discharge un:Ynfz  Estimated DC date:after cardiology clearance    Consultants: cardiology,pccm  Procedures:intubation  Antimicrobials:  Anti-infectives (From admission, onward)    Start     Dose/Rate Route Frequency Ordered Stop   02/16/24 0900  sulfamethoxazole -trimethoprim  (BACTRIM  DS) 800-160 MG per tablet 1 tablet        1 tablet Oral Once per day on Monday Wednesday Friday 02/14/24 1241     02/15/24 1000  entecavir  (BARACLUDE ) tablet 0.5 mg        0.5 mg Oral Daily 02/14/24 1241     02/14/24 2200  acyclovir  (ZOVIRAX ) 200 MG/5ML suspension SUSP 800 mg  Status:  Discontinued        800 mg Oral 2 times daily 02/14/24 1241 02/14/24 1241   02/14/24 2200  acyclovir  (ZOVIRAX ) 200 MG capsule 800 mg        800 mg Oral 2 times daily 02/14/24 1242     02/13/24 1500  entecavir  (  BARACLUDE ) tablet 0.5 mg  Status:  Discontinued        0.5 mg Per Tube Daily 02/12/24 1126 02/14/24 1241   02/13/24 1000  sulfamethoxazole -trimethoprim  (BACTRIM ) 200-40 MG/5ML suspension 20 mL  Status:  Discontinued        20 mL Per Tube  Every M-W-F 02/11/24 1242 02/14/24 1241   02/11/24 1100  acyclovir  (ZOVIRAX ) 200 MG/5ML suspension SUSP 800 mg  Status:  Discontinued        800 mg Per Tube 2 times daily 02/11/24 1006 02/14/24 1241   02/11/24 1100  sulfamethoxazole -trimethoprim  (BACTRIM ) 200-40 MG/5ML suspension 20 mL  Status:  Discontinued        20 mL Per Tube Daily 02/11/24 1006 02/11/24 1242   02/11/24 1100  entecavir  (BARACLUDE ) tablet 0.5 mg  Status:  Discontinued        0.5 mg Per Tube Every 24 hours 02/11/24 1006 02/12/24 1126   02/11/24 0945  piperacillin -tazobactam (ZOSYN ) IVPB 3.375 g        3.375 g 12.5 mL/hr over 240 Minutes Intravenous Every 8 hours 02/11/24 0942 02/18/24 1059   02/10/24 2115  cefTRIAXone  (ROCEPHIN ) 2 g in sodium chloride  0.9 % 100 mL IVPB  Status:  Discontinued        2 g 200 mL/hr over 30 Minutes Intravenous Every 24 hours 02/10/24 2109 02/11/24 9057       Subjective: Patient seen and examined at bedside today.  Hemodynamically stable.  Overall comfortable.  Lying in bed.  On room air.  Denies shortness of breath or cough.  No diarrhea, abdomen pain or nausea or vomiting.  Looks euvolemic.  Objective: Vitals:   02/17/24 2016 02/18/24 0029 02/18/24 0515 02/18/24 0845  BP: 96/67 111/65 98/67 98/71   Pulse: 85 91 82 84  Resp: 17 17 17 16   Temp: 98 F (36.7 C) 98.2 F (36.8 C) 98.3 F (36.8 C) 97.8 F (36.6 C)  TempSrc: Oral Oral Oral Oral  SpO2: 99% 100% 100% 98%  Weight:   100.9 kg   Height:        Intake/Output Summary (Last 24 hours) at 02/18/2024 1240 Last data filed at 02/18/2024 1222 Gross per 24 hour  Intake 487.1 ml  Output --  Net 487.1 ml   Filed Weights   02/16/24 0405 02/17/24 0440 02/18/24 0515  Weight: 101.5 kg 101 kg 100.9 kg    Examination:  General exam: Overall comfortable, not in distress HEENT: PERRL Respiratory system:  no wheezes or crackles  Cardiovascular system: S1 & S2 heard, RRR.  Gastrointestinal system: Abdomen is nondistended, soft and  nontender. Central nervous system: Alert and oriented Extremities: No edema, no clubbing ,no cyanosis Skin: No rashes, no ulcers,no icterus     Data Reviewed: I have personally reviewed following labs and imaging studies  CBC: Recent Labs  Lab 02/13/24 0222 02/14/24 1134 02/15/24 0319 02/16/24 0807 02/17/24 1124 02/17/24 1131 02/17/24 1143 02/18/24 0433  WBC 3.5* 5.4 3.6* 3.4*  --   --   --  4.9  HGB 10.2* 12.4* 11.5* 12.7* 12.2* 13.3 12.6* 13.5  HCT 30.4* 35.9* 32.2* 36.2* 36.0* 39.0 37.0* 38.8*  MCV 96.2 94.5 91.2 91.6  --   --   --  92.8  PLT 144* 173 162 162  --   --   --  205   Basic Metabolic Panel: Recent Labs  Lab 02/11/24 1630 02/12/24 0327 02/13/24 0222 02/14/24 9276 02/15/24 0319 02/16/24 9192 02/17/24 9566 02/17/24 1124 02/17/24 1131 02/17/24 1143 02/18/24 9566  NA 146* 144 142 140 142 139 140 141 140 140 139   K 3.9 3.9 3.6 5.0 3.3* 4.0 4.0 3.4* 3.8 3.7 3.9  CL 113* 112* 112* 109 104 103 103  --   --   --  102  CO2 23 22 21* 16* 25 26 24   --   --   --  25  GLUCOSE 88 94 101* 104* 112* 106* 100*  --   --   --  104*  BUN 20 19 19 15 12 10 15   --   --   --  18  CREATININE 1.05 1.09 1.05 0.85 0.92 0.97 1.06  --   --   --  1.13  CALCIUM  8.5* 8.6* 8.8* 9.2 9.4 10.0 9.6  --   --   --  9.7  MG 2.0 2.2 2.2 2.0 1.8 2.2  --   --   --   --   --   PHOS 2.7 2.6 3.2 4.1 3.7  --   --   --   --   --   --      Recent Results (from the past 240 hours)  Resp panel by RT-PCR (RSV, Flu A&B, Covid) Anterior Nasal Swab     Status: Abnormal   Collection Time: 02/10/24  6:29 PM   Specimen: Anterior Nasal Swab  Result Value Ref Range Status   SARS Coronavirus 2 by RT PCR POSITIVE (A) NEGATIVE Final   Influenza A by PCR NEGATIVE NEGATIVE Final   Influenza B by PCR NEGATIVE NEGATIVE Final    Comment: (NOTE) The Xpert Xpress SARS-CoV-2/FLU/RSV plus assay is intended as an aid in the diagnosis of influenza from Nasopharyngeal swab specimens and should not be used as a sole  basis for treatment. Nasal washings and aspirates are unacceptable for Xpert Xpress SARS-CoV-2/FLU/RSV testing.  Fact Sheet for Patients: bloggercourse.com  Fact Sheet for Healthcare Providers: seriousbroker.it  This test is not yet approved or cleared by the [place]  FDA and has been authorized for detection and/or diagnosis of SARS-CoV-2 by FDA under an Emergency Use Authorization (EUA). This EUA will remain in effect (meaning this test can be used) for the duration of the COVID-19 declaration under Section 564(b)(1) of the Act, 21 U.S.C. section 360bbb-3(b)(1), unless the authorization is terminated or revoked.     Resp Syncytial Virus by PCR NEGATIVE NEGATIVE Final    Comment: (NOTE) Fact Sheet for Patients: bloggercourse.com  Fact Sheet for Healthcare Providers: seriousbroker.it  This test is not yet approved or cleared by the [place]  FDA and has been authorized for detection and/or diagnosis of SARS-CoV-2 by FDA under an Emergency Use Authorization (EUA). This EUA will remain in effect (meaning this test can be used) for the duration of the COVID-19 declaration under Section 564(b)(1) of the Act, 21 U.S.C. section 360bbb-3(b)(1), unless the authorization is terminated or revoked.  Performed at [facility], [address] [place], [place] [address]   Culture, Respiratory w Gram Stain     Status: None   Collection Time: 02/10/24  9:10 PM   Specimen: Tracheal Aspirate; Respiratory  Result Value Ref Range Status   Specimen Description TRACHEAL ASPIRATE  Final   Special Requests NONE  Final   Gram Stain   Final    FEW WBC PRESENT, PREDOMINANTLY PMN NO ORGANISMS SEEN Performed at [facility], [address], [place], [place] [address]    Culture FEW PSEUDOMONAS AERUGINOSA  Final   Report Status 02/14/2024 FINAL  Final   Organism ID, Bacteria  PSEUDOMONAS AERUGINOSA  Final      Susceptibility   Pseudomonas aeruginosa - MIC*    MEROPENEM 1 SENSITIVE Sensitive     CIPROFLOXACIN 0.25 SENSITIVE Sensitive     IMIPENEM 2 SENSITIVE Sensitive     PIP/TAZO Value in next row Sensitive      8 SENSITIVEThis is a modified FDA-approved test that has been validated and its performance characteristics determined by the reporting laboratory.  This laboratory is certified under the Clinical Laboratory Improvement Amendments CLIA as qualified to perform high complexity clinical laboratory testing.    CEFEPIME Value in next row Sensitive      8 SENSITIVEThis is a modified FDA-approved test that has been validated and its performance characteristics determined by the reporting laboratory.  This laboratory is certified under the Clinical Laboratory Improvement Amendments CLIA as qualified to perform high complexity clinical laboratory testing.    CEFTAZIDIME/AVIBACTAM Value in next row Sensitive      8 SENSITIVEThis is a modified FDA-approved test that has been validated and its performance characteristics determined by the reporting laboratory.  This laboratory is certified under the Clinical Laboratory Improvement Amendments CLIA as qualified to perform high complexity clinical laboratory testing.    CEFTOLOZANE/TAZOBACTAM Value in next row Sensitive      8 SENSITIVEThis is a modified FDA-approved test that has been validated and its performance characteristics determined by the reporting laboratory.  This laboratory is certified under the Clinical Laboratory Improvement Amendments CLIA as qualified to perform high complexity clinical laboratory testing.    TOBRAMYCIN Value in next row Sensitive      8 SENSITIVEThis is a modified FDA-approved test that has been validated and its performance characteristics determined by the reporting laboratory.  This laboratory is certified under the Clinical Laboratory Improvement Amendments CLIA as qualified to perform high  complexity clinical laboratory testing.    CEFTAZIDIME Value in next row Sensitive      8 SENSITIVEThis is a modified FDA-approved test that has been validated and its performance characteristics determined by the reporting laboratory.  This laboratory is certified under the Clinical Laboratory Improvement Amendments CLIA as qualified to perform high complexity clinical laboratory testing.    * FEW PSEUDOMONAS AERUGINOSA  MRSA Next Gen by PCR, Nasal     Status: None   Collection Time: 02/10/24 10:23 PM   Specimen: Nasal Mucosa; Nasal Swab  Result Value Ref Range Status   MRSA by PCR Next Gen NOT DETECTED NOT DETECTED Final    Comment: (NOTE) The GeneXpert MRSA Assay (FDA approved for NASAL specimens only), is one component of a comprehensive MRSA colonization surveillance program. It is not intended to diagnose MRSA infection nor to guide or monitor treatment for MRSA infections. Test performance is not FDA approved in patients less than 15 years old. Performed at [facility], [address] [place], [place] [address]   Culture, blood (Routine X 2) w Reflex to ID Panel     Status: None   Collection Time: 02/10/24 11:14 PM   Specimen: BLOOD LEFT ARM  Result Value Ref Range Status   Specimen Description BLOOD LEFT ARM  Final   Special Requests   Final    BOTTLES DRAWN AEROBIC AND ANAEROBIC Blood Culture adequate volume   Culture   Final    NO GROWTH 5 DAYS Performed at [facility], [address], [place], [place] [address]    Report Status 02/15/2024 FINAL  Final  Culture,  blood (Routine X 2) w Reflex to ID Panel     Status: Abnormal   Collection Time: 02/10/24 11:25 PM   Specimen: BLOOD LEFT HAND  Result Value Ref Range Status   Specimen Description BLOOD LEFT HAND  Final   Special Requests   Final    BOTTLES DRAWN AEROBIC AND ANAEROBIC Blood Culture adequate volume   Culture  Setup Time   Final    GRAM POSITIVE COCCI ANAEROBIC BOTTLE ONLY CRITICAL RESULT CALLED  TO, READ BACK BY AND VERIFIED WITH: PHARMD E. REOME [id number] @ 1939 FH    Culture (A)  Final    STAPHYLOCOCCUS EPIDERMIDIS THE SIGNIFICANCE OF ISOLATING THIS ORGANISM FROM A SINGLE SET OF BLOOD CULTURES WHEN MULTIPLE SETS ARE DRAWN IS UNCERTAIN. PLEASE NOTIFY THE MICROBIOLOGY DEPARTMENT WITHIN ONE WEEK IF SPECIATION AND SENSITIVITIES ARE REQUIRED. Performed at [facility], [address], [place], [place] [address]    Report Status 02/13/2024 FINAL  Final  Blood Culture ID Panel (Reflexed)     Status: Abnormal   Collection Time: 02/10/24 11:25 PM  Result Value Ref Range Status   Enterococcus faecalis NOT DETECTED NOT DETECTED Final   Enterococcus Faecium NOT DETECTED NOT DETECTED Final   Listeria monocytogenes NOT DETECTED NOT DETECTED Final   Staphylococcus species DETECTED (A) NOT DETECTED Final    Comment: CRITICAL RESULT CALLED TO, READ BACK BY AND VERIFIED WITH: PHARMD E. REOME [id number] @ 1939 FH    Staphylococcus aureus (BCID) NOT DETECTED NOT DETECTED Final   Staphylococcus epidermidis DETECTED (A) NOT DETECTED Final    Comment: Methicillin (oxacillin) resistant coagulase negative staphylococcus. Possible blood culture contaminant (unless isolated from more than one blood culture draw or clinical case suggests pathogenicity). No antibiotic treatment is indicated for blood  culture contaminants. CRITICAL RESULT CALLED TO, READ BACK BY AND VERIFIED WITH: PHARMD E. REOME [id number] @ 1939 FH    Staphylococcus lugdunensis NOT DETECTED NOT DETECTED Final   Streptococcus species NOT DETECTED NOT DETECTED Final   Streptococcus agalactiae NOT DETECTED NOT DETECTED Final   Streptococcus pneumoniae NOT DETECTED NOT DETECTED Final   Streptococcus pyogenes NOT DETECTED NOT DETECTED Final   A.calcoaceticus-baumannii NOT DETECTED NOT DETECTED Final   Bacteroides fragilis NOT DETECTED NOT DETECTED Final   Enterobacterales NOT DETECTED NOT DETECTED Final   Enterobacter cloacae complex NOT  DETECTED NOT DETECTED Final   Escherichia coli NOT DETECTED NOT DETECTED Final   Klebsiella aerogenes NOT DETECTED NOT DETECTED Final   Klebsiella oxytoca NOT DETECTED NOT DETECTED Final   Klebsiella pneumoniae NOT DETECTED NOT DETECTED Final   Proteus species NOT DETECTED NOT DETECTED Final   Salmonella species NOT DETECTED NOT DETECTED Final   Serratia marcescens NOT DETECTED NOT DETECTED Final   Haemophilus influenzae NOT DETECTED NOT DETECTED Final   Neisseria meningitidis NOT DETECTED NOT DETECTED Final   Pseudomonas aeruginosa NOT DETECTED NOT DETECTED Final   Stenotrophomonas maltophilia NOT DETECTED NOT DETECTED Final   Candida albicans NOT DETECTED NOT DETECTED Final   Candida auris NOT DETECTED NOT DETECTED Final   Candida glabrata NOT DETECTED NOT DETECTED Final   Candida krusei NOT DETECTED NOT DETECTED Final   Candida parapsilosis NOT DETECTED NOT DETECTED Final   Candida tropicalis NOT DETECTED NOT DETECTED Final   Cryptococcus neoformans/gattii NOT DETECTED NOT DETECTED Final   Methicillin resistance mecA/C DETECTED (A) NOT DETECTED Final    Comment: CRITICAL RESULT CALLED TO, READ BACK BY AND VERIFIED WITH: PHARMD E. REOME [id number] @ 1939 FH Performed at  [facility], [address] [place], [place] [address]      Radiology Studies: CARDIAC CATHETERIZATION Result Date: 02/17/2024   The left ventricular ejection fraction is 45-50% by visual estimate. Findings: Ao = 119/91 (105) LV = 123/3 RA = 2 RV = 21/5 PA = 26/9 (14) PCW = 3 Fick cardiac output/index = 5.8/2.6 Thermo CO/CI = 4.5/2.0 PVR = 2.6 (Fick) 2.0 (TD) Ao sat = 95% PA sat = 62%, 69% PAPi = 8.5 Assessment: 1. Normal coronaries 2. EF 45-50% by v-gram (35% by echo) 3. Low filling pressures 4. Normal outputs by Fick Plan/Discussion: Medical therapy Daniel Bensimhon, MD 11:57 AM   Scheduled Meds:  acyclovir   800 mg Oral BID   amiodarone   200 mg Oral BID   carvedilol   3.125 mg Oral BID WC   entecavir   0.5 mg  Oral Daily   fondaparinux  (ARIXTRA ) injection  2.5 mg Subcutaneous Q24H   lidocaine   1 patch Transdermal QHS   Ensure Max Protein  11 oz Oral BID   sacubitril -valsartan   1 tablet Oral BID   sodium chloride  flush  3 mL Intravenous Q12H   spironolactone   25 mg Oral Daily   sulfamethoxazole -trimethoprim   1 tablet Oral Once per day on Monday Wednesday Friday   Continuous Infusions:   LOS: 8 days   Ivonne Mustache, MD [facility] P1/21/2026, 12:40 PM  "

## 2024-02-18 NOTE — Progress Notes (Signed)
 Limited echocardiogram will be rescheduled tentatively for 02/19/24 due to scheduling issues.   Koleen Popper, RDCS

## 2024-02-18 NOTE — Plan of Care (Signed)
  Problem: Pain Managment: Goal: General experience of comfort will improve and/or be controlled Outcome: Progressing   Problem: Safety: Goal: Ability to remain free from injury will improve Outcome: Progressing

## 2024-02-18 NOTE — Progress Notes (Addendum)
 "    Advanced Heart Failure Rounding Note  Cardiologist: None  AH[facility] Cardiologist: Dr. Cherrie Chief Complaint: Cardiac Arrest Patient Profile   Bradley Hunt is a 54 y.o. 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  Significant events:   1/13: OOH cardiac arrest w/o bystander CPR. Shockable rhythm by EMS, intubated. ?Seizure 1/14: EF 20-25%, G1DD, nl RV function 1/16: Extubated  Subjective:    Cath yesterday with normal cors  and relatively well compensated hemodynamics. EF 45-50%?   RA = 2 RV = 21/5 PA = 26/9 (14) PCW = 3 Fick cardiac output/index = 5.8/2.6 Thermo CO/CI = 4.5/2.0 PVR = 2.6 (Fick) 2.0 (TD) Ao sat = 95% PA sat = 62%, 69% PAPi = 8.5  Remains pleasantly confused at times. No CP or SOB  Objective:    Weight Range: 100.9 kg Body mass index is 28.56 kg/m.   Vital Signs:   Temp:  [97.8 F (36.6 C)-98.3 F (36.8 C)] 97.8 F (36.6 C) (01/21 0845) Pulse Rate:  [0-94] 84 (01/21 0845) Resp:  [15-22] 16 (01/21 0845) BP: (96-157)/(65-103) 98/71 (01/21 0845) SpO2:  [95 %-100 %] 98 % (01/21 0845) Weight:  [100.9 kg] 100.9 kg (01/21 0515) Last BM Date : 02/15/24  Weight change: Filed Weights   02/16/24 0405 02/17/24 0440 02/18/24 0515  Weight: 101.5 kg 101 kg 100.9 kg   Intake/Output: No intake or output data in the 24 hours ending 02/18/24 0911   Physical Exam   General:  Sitting up in bed. No resp difficulty HEENT: normal Neck: supple. no JVD.  Cor: Regular rate & rhythm. No rubs, gallops or murmurs. Lungs: clear Abdomen: soft, nontender, nondistended.Good bowel sounds. Extremities: no cyanosis, clubbing, rash, edema Neuro: alert pleasantly confused at times    Telemetry   SR  80s,  occasional PVCs (personally reviewed)  Labs   CBC Recent Labs    02/16/24 0807 02/17/24 1124 02/17/24 1143 02/18/24 0433  WBC 3.4*  --   --  4.9  HGB 12.7*   < > 12.6* 13.5  HCT 36.2*   < > 37.0* 38.8*  MCV 91.6  --   --  92.8  PLT 162  --   --  205   < > = values in this interval not displayed.   Basic Metabolic Panel Recent Labs    98/80/73 0807 02/17/24 0433 02/17/24 1124 02/17/24 1143 02/18/24 0433  NA 139 140   < > 140 139  K 4.0 4.0   < > 3.7 3.9  CL 103 103  --   --  102  CO2 26 24  --   --  25  GLUCOSE 106* 100*  --   --  104*  BUN 10 15  --   --  18  CREATININE 0.97 1.06  --   --  1.13  CALCIUM  10.0 9.6  --   --  9.7  MG 2.2  --   --   --   --    < > = values in this interval not displayed.   Liver Function Tests No results for input(s): AST, ALT, ALKPHOS, BILITOT, PROT, ALBUMIN in the last 72 hours.  No results for input(s): LIPASE, AMYLASE in the last 72 hours.  ProBNP (last 3 results) Recent Labs  02/10/24 1809  PROBNP 83.3   Medications:    Scheduled Medications:  acyclovir   800 mg Oral BID   amiodarone   200 mg Oral BID   carvedilol   3.125 mg Oral BID WC   entecavir   0.5 mg Oral Daily   fondaparinux  (ARIXTRA ) injection  2.5 mg Subcutaneous Q24H   lidocaine   1 patch Transdermal QHS   Ensure Max Protein  11 oz Oral BID   sacubitril -valsartan   1 tablet Oral BID   sodium chloride  flush  3 mL Intravenous Q12H   spironolactone   25 mg Oral Daily   sulfamethoxazole -trimethoprim   1 tablet Oral Once per day on Monday Wednesday Friday    Infusions:  sodium chloride      piperacillin -tazobactam (ZOSYN )  IV 3.375 g (02/18/24 0228)    PRN Medications: sodium chloride , acetaminophen  **OR** acetaminophen  (TYLENOL ) oral liquid 160 mg/5 mL **OR** acetaminophen , acetaminophen , HYDROmorphone  (DILAUDID ) injection, melatonin, ondansetron  (ZOFRAN ) IV, ondansetron  (ZOFRAN ) IV, polyethylene glycol, senna, sodium chloride   flush  Assessment/Plan   Cardiac Arrest,  VT/VF,  Acute on chronic HFrEF: Found unresponsive by friend, no bystander CPR. Shockable rhythm on EMS arrival, shock x1. Known EF 35-40%. In the setting of severe electrolytes derangements K 2.8 on admission, due to infectious process/diarrhea. He has norovirus, COVID, and rhinovirus.  - Echo read as 20-25% (30-35% on Dr. Charley read which is his baseline) - Initial concern for severe anoxic injury but extubated 1/16 and mental status improving but not back to baseline - Cath 1/20 no CAD. EF 45-50% ? RHC numbers ok  - Titrate GDMT as tolerated - continue coreg  3.125 mg bid - continue entresto  49/51 mg bid - continue spiro 25 gm daily - hold on SGLT2i with low volume status for now - Suspect VT/VF related to CM and electrolyte abnormalities - Discussed with EP. Recommend LifeVest. Will order - Repeat limited echo   Acute hypoxic resp failure, Rhinovius, COVID infection, ?CAP - on vent; mgmt per CCM - on zosyn /entecavir  - Extubated 1/16  Multiple Myeloma with Spinal METS - s/p chemo and stem cell transplant 2018 - recent recurrence s/p recent CAR-T and fludarabine /cytoxan  lymphodepletion at [facility] - on bactrim /acyclovir  for ppx after tx  Hypokalemia, Hypomagnesemia - supp as needed  Anoxic brain injury - remains confused but improving  PVCs - continue amio  Length of Stay: 8  Toribio Fuel, MD  02/18/2024, 9:11 AM  Advanced Heart Failure Team Pager [phone] (M-F; 7a - 5p)   Please visit [email or web address]: For overnight coverage please call cardiology fellow first. If fellow not available call Shock/ECMO MD on call.  For ECMO / Mechanical Support (Impella, IABP, LVAD) issues call Shock / ECMO MD on call.     "

## 2024-02-18 NOTE — Progress Notes (Signed)
 Occupational Therapy Treatment Patient Details Name: Bradley Hunt MRN: [id number] DOB: 1970/03/01 Today's Date: 02/18/2024   History of present illness The pt is a 54 yo male presenting 1/13 after being found unresponsive by friends at work, CPR by EMS with ROSC after 8 min, unknown total downtime. Intubated on arrival, admission complicated by recurrent posturing episodes vs seizure? (EEG negative) as well as testing positive for Covid, Norovirus, and rotavirus. Extubated 1/16. PMH includes: multiple myeloma, previous bony metastasis, chemo and radiation therapy, and previous pancytopenia.   OT comments  Pt reported to have decrease in foggy however needed cues on date, location and what occurred to lead him to the hospital. PT agreeable to complete light ADLS at the sink but needed cues on what tasks this this therapist wanted to complete x3 times in session. Pt also noted cues on how to sequence task as he took washcloth from peri care then used to face. At this time if family can provide supervision to [facility] recommendation for OP therapy and Acute Occupational Therapy to follow.       If plan is discharge home, recommend the following:  A little help with walking and/or transfers;A little help with bathing/dressing/bathroom;Assistance with cooking/housework;Direct supervision/assist for medications management;Direct supervision/assist for financial management;Assist for transportation;Supervision due to cognitive status   Equipment Recommendations  Tub/shower seat    Recommendations for Other Services      Precautions / Restrictions Precautions Precautions: Fall Recall of Precautions/Restrictions: Impaired Precaution/Restrictions Comments: watch BP (HTN), contact precuation Restrictions Weight Bearing Restrictions Per Provider Order: No       Mobility Bed Mobility Overal bed mobility: Needs Assistance, Modified Independent Bed Mobility: Supine to Sit           General bed  mobility comments: increase in time    Transfers Overall transfer level: Needs assistance Equipment used: Rolling walker (2 wheels), None Transfers: Sit to/from Stand Sit to Stand: Supervision           General transfer comment: intially used RW then progressed to no devices     Balance Overall balance assessment: Needs assistance Sitting-balance support: Feet supported Sitting balance-Leahy Scale: Good     Standing balance support: Bilateral upper extremity supported, No upper extremity supported Standing balance-Leahy Scale: Good                             ADL either performed or assessed with clinical judgement   ADL Overall ADL's : Needs assistance/impaired Eating/Feeding: Independent   Grooming: Supervision/safety;Standing   Upper Body Bathing: Supervision/ safety;Standing   Lower Body Bathing: Supervison/ safety;Contact guard assist;Sit to/from stand   Upper Body Dressing : Supervision/safety;Standing   Lower Body Dressing: Supervision/safety;Contact guard assist;Sit to/from stand   Toilet Transfer: Supervision/safety   Toileting- Clothing Manipulation and Hygiene: Supervision/safety       Functional mobility during ADLs: Supervision/safety;Rolling walker (2 wheels) General ADL Comments: started with RW but then put to the side and ambulated with CGA    Extremity/Trunk Assessment Upper Extremity Assessment Upper Extremity Assessment: Overall WFL for tasks assessed   Lower Extremity Assessment Lower Extremity Assessment: Defer to PT evaluation        Vision   Vision Assessment?: No apparent visual deficits   Perception Perception Perception: Within Functional Limits   Praxis     Communication Communication Communication: No apparent difficulties   Cognition Arousal: Alert Behavior During Therapy: WFL for tasks assessed/performed Cognition: Cognition impaired   Orientation impairments: Time  Memory impairment (select all  impairments): Short-term memory, Working Biochemist, Clinical functioning impairment (select all impairments): Sequencing, Reasoning, Problem solving OT - Cognition Comments: pt reporting feeling much better but when asking about today's date needed cues and was educated on working on orientation with devices with the return to home, he also required cues with sequencing with ADLS as when clening did peri care then used same washcloth for face                 Following commands: Impaired Following commands impaired: Only follows one step commands consistently, Follows multi-step commands inconsistently      Cueing   Cueing Techniques: Verbal cues, Gestural cues  Exercises      Shoulder Instructions       General Comments      Pertinent Vitals/ Pain       Pain Assessment Pain Assessment: No/denies pain  Home Living                                          Prior Functioning/Environment              Frequency  Min 2X/week        Progress Toward Goals  OT Goals(current goals can now be found in the care plan section)  Progress towards OT goals: Progressing toward goals  Acute Rehab OT Goals Patient Stated Goal: to eat/visit with friend OT Goal Formulation: With patient Time For Goal Achievement: 02/29/24 Potential to Achieve Goals: Good ADL Goals Pt Will Perform Lower Body Bathing: with modified independence;sit to/from stand;sitting/lateral leans Pt Will Perform Lower Body Dressing: with modified independence;sit to/from stand;sitting/lateral leans Pt Will Transfer to Toilet: with modified independence;ambulating Additional ADL Goal #1: Pt to complete further functional cognitive tests (pill box, etc) Additional ADL Goal #2: Pt to complete 3 step trail making task with min verbal cues  Plan      Co-evaluation                 AM-PAC OT 6 Clicks Daily Activity     Outcome Measure   Help from another person eating meals?:  None Help from another person taking care of personal grooming?: None Help from another person toileting, which includes using toliet, bedpan, or urinal?: A Little Help from another person bathing (including washing, rinsing, drying)?: A Little Help from another person to put on and taking off regular upper body clothing?: None Help from another person to put on and taking off regular lower body clothing?: A Little 6 Click Score: 21    End of Session Equipment Utilized During Treatment: Gait belt;Rolling walker (2 wheels)  OT Visit Diagnosis: Unsteadiness on feet (R26.81);Other abnormalities of gait and mobility (R26.89);Muscle weakness (generalized) (M62.81)   Activity Tolerance Patient tolerated treatment well   Patient Left in chair;with call bell/phone within reach;with chair alarm set;with family/visitor present   Nurse Communication Mobility status        Time: 9155-9081 OT Time Calculation (min): 34 min  Charges: OT General Charges $OT Visit: 1 Visit OT Treatments $Self Care/Home Management : 23-37 mins  Warrick POUR OTR/L  Acute Rehab Services  [phone] office number   Warrick Berber 02/18/2024, 9:26 AM

## 2024-02-18 NOTE — TOC Progression Note (Addendum)
 Transition of Care [facility]) - Progression Note    Patient Details  Name: Bradley Hunt MRN: [id number] Date of Birth: Jan 04, 1971  Transition of [facility]) CM/SW Contact  Graves-Bigelow, Erminio Deems, RN Phone Number: 02/18/2024, 11:28 AM  Clinical Narrative:     ICM received notification that the patient will need a Life Vest for home.  Life Vest orders submitted to [facility] Liaison;  awaiting insurance authorization. [facility] aware that the patient will possibly discharge tomorrow. ICM will continue to follow for additional needs as the patient progresses.   Ambulatory referral submitted via EPIC for outpatient physical therapy. Information placed on the AVS. No Further needs identified at this time.   [id number] 02-18-24 Patient has been approved for Life Vest. [facility] will fit the patient around 1630. No further needs identified at this time.  Social Drivers of Health (SDOH) Interventions SDOH Screenings   Food Insecurity: No Food Insecurity (02/11/2024)  Housing: Low Risk (02/11/2024)  Transportation Needs: No Transportation Needs (02/11/2024)  Utilities: Not At Risk (02/11/2024)  Depression (PHQ2-9): Low Risk (11/04/2023)  Social Connections: Socially Integrated (02/11/2024)  Tobacco Use: Medium Risk (02/14/2024)    Readmission Risk Interventions     No data to display

## 2024-02-19 ENCOUNTER — Other Ambulatory Visit (HOSPITAL_COMMUNITY): Payer: Self-pay

## 2024-02-19 ENCOUNTER — Inpatient Hospital Stay (HOSPITAL_COMMUNITY)

## 2024-02-19 DIAGNOSIS — I472 Ventricular tachycardia, unspecified: Secondary | ICD-10-CM | POA: Diagnosis not present

## 2024-02-19 DIAGNOSIS — I5023 Acute on chronic systolic (congestive) heart failure: Secondary | ICD-10-CM

## 2024-02-19 DIAGNOSIS — I469 Cardiac arrest, cause unspecified: Secondary | ICD-10-CM | POA: Diagnosis not present

## 2024-02-19 LAB — BASIC METABOLIC PANEL WITH GFR
Anion gap: 12 (ref 5–15)
BUN: 23 mg/dL — ABNORMAL HIGH (ref 6–20)
CO2: 24 mmol/L (ref 22–32)
Calcium: 9.6 mg/dL (ref 8.9–10.3)
Chloride: 104 mmol/L (ref 98–111)
Creatinine, Ser: 1.2 mg/dL (ref 0.61–1.24)
GFR, Estimated: >60 mL/min
Glucose, Bld: 119 mg/dL — ABNORMAL HIGH (ref 70–99)
Potassium: 4.3 mmol/L (ref 3.5–5.1)
Sodium: 141 mmol/L (ref 135–145)

## 2024-02-19 LAB — ECHOCARDIOGRAM LIMITED
Area-P 1/2: 3.31 cm2
Height: 74 in
MV M vel: 3.89 m/s
MV Peak grad: 60.5 mmHg
S' Lateral: 3.6 cm
Single Plane A4C EF: 48.7 %
Weight: 3552 [oz_av]

## 2024-02-19 MED ORDER — SPIRONOLACTONE 25 MG PO TABS
25.0000 mg | ORAL_TABLET | Freq: Every day | ORAL | 0 refills | Status: AC
  Filled 2024-02-19: qty 30, 30d supply, fill #0

## 2024-02-19 MED ORDER — CARVEDILOL 3.125 MG PO TABS
3.1250 mg | ORAL_TABLET | Freq: Two times a day (BID) | ORAL | 0 refills | Status: AC
  Filled 2024-02-19: qty 60, 30d supply, fill #0

## 2024-02-19 MED ORDER — ENTECAVIR 0.5 MG PO TABS
0.5000 mg | ORAL_TABLET | Freq: Every day | ORAL | 0 refills | Status: AC
  Filled 2024-02-19: qty 30, 30d supply, fill #0

## 2024-02-19 MED ORDER — SACUBITRIL-VALSARTAN 49-51 MG PO TABS
1.0000 | ORAL_TABLET | Freq: Two times a day (BID) | ORAL | 0 refills | Status: AC
  Filled 2024-02-19: qty 60, 30d supply, fill #0

## 2024-02-19 MED ORDER — ACYCLOVIR 400 MG PO TABS
800.0000 mg | ORAL_TABLET | Freq: Two times a day (BID) | ORAL | 0 refills | Status: AC
  Filled 2024-02-19: qty 90, 23d supply, fill #0

## 2024-02-19 MED ORDER — SULFAMETHOXAZOLE-TRIMETHOPRIM 800-160 MG PO TABS
1.0000 | ORAL_TABLET | ORAL | 0 refills | Status: AC
  Filled 2024-02-19: qty 12, 28d supply, fill #0

## 2024-02-19 MED ORDER — AMIODARONE HCL 200 MG PO TABS
200.0000 mg | ORAL_TABLET | Freq: Two times a day (BID) | ORAL | 0 refills | Status: AC
  Filled 2024-02-19: qty 60, 30d supply, fill #0

## 2024-02-19 NOTE — TOC Transition Note (Signed)
 Transition of Care [facility]) - Discharge Note   Patient Details  Name: Bradley Hunt MRN: [id number] Date of Birth: October 21, 1970  Transition of Care [facility]) CM/SW Contact:  Sudie Erminio Deems, RN Phone Number: 02/19/2024, 11:04 AM   Clinical Narrative: [facility] had to reassign the patient's fit time. ICM received a call from [place] with [facility] and the patient will be fit for the Life Vest between 12:00-12:30. Staff RN and MD aware. No further home needs identified at this time. Patient will be transported home via private vehicle.  Final next level of care: Home/Self Care Barriers to Discharge: No Barriers Identified  Patient Goals and CMS Choice Patient states their goals for this hospitalization and ongoing recovery are:: plan to return home  Discharge Plan and Services Additional resources added to the After Visit Summary for   In-house Referral: NA Discharge Planning Services: CM Consult Post Acute Care Choice: Durable Medical Equipment          DME Arranged: Life vest DME Agency: [facility] Date DME Agency Contacted: 02/19/24 Time DME Agency Contacted: 1104 Representative spoke with at DME Agency: [facility] HH Arranged: NA   Social Drivers of Health (SDOH) Interventions SDOH Screenings   Food Insecurity: No Food Insecurity (02/11/2024)  Housing: Low Risk (02/11/2024)  Transportation Needs: No Transportation Needs (02/11/2024)  Utilities: Not At Risk (02/11/2024)  Depression (PHQ2-9): Low Risk (11/04/2023)  Social Connections: Socially Integrated (02/11/2024)  Tobacco Use: Medium Risk (02/14/2024)   Readmission Risk Interventions     No data to display

## 2024-02-19 NOTE — Plan of Care (Signed)
  Problem: Clinical Measurements: Goal: Diagnostic test results will improve Outcome: Progressing Goal: Respiratory complications will improve Outcome: Progressing   Problem: Activity: Goal: Risk for activity intolerance will decrease Outcome: Progressing   Problem: Safety: Goal: Ability to remain free from injury will improve Outcome: Progressing   

## 2024-02-19 NOTE — Progress Notes (Addendum)
 "    Advanced Heart Failure Rounding Note  Cardiologist: None  AHF Cardiologist: Dr. Cherrie Chief Complaint: Cardiac Arrest Patient Profile   Bradley Hunt is a 54 y.o. 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  Significant events:   1/13: OOH cardiac arrest w/o bystander CPR. Shockable rhythm by EMS, intubated. ?Seizure 1/14: EF 20-25%, G1DD, nl RV function 1/16: Extubated 1/20: normal cors, EF ?45-50%, RA 2, PA 26/9 (14), TD CO/CI 4.5/2.0, PVR 2.0  Subjective:    Mentation appears to be improving, close to baseline. No complaints. Ambulating.   Objective:    Weight Range: 100.7 kg Body mass index is 28.5 kg/m.   Vital Signs:   Temp:  [97.7 F (36.5 C)-98.7 F (37.1 C)] 98.3 F (36.8 C) (01/22 0900) Pulse Rate:  [73-82] 75 (01/22 0900) Resp:  [16-18] 16 (01/22 0900) BP: (95-118)/(56-83) 111/68 (01/22 0900) SpO2:  [99 %-100 %] 99 % (01/22 0900) Weight:  [100.7 kg] 100.7 kg (01/22 0429) Last BM Date : 02/18/24  Weight change: Filed Weights   02/17/24 0440 02/18/24 0515 02/19/24 0429  Weight: 101 kg 100.9 kg 100.7 kg   Intake/Output:  Intake/Output Summary (Last 24 hours) at 02/19/2024 0919 Last data filed at 02/18/2024 1222 Gross per 24 hour  Intake 487.1 ml  Output --  Net 487.1 ml   Physical Exam   General: Well appearing. No distress  Cardiac: JVP flat. No murmurs  Extremities: Warm and dry.  No edema.  Neuro: A&O x3. Affect pleasant.   Telemetry   SR 80s (personally reviewed)  Labs   CBC Recent Labs    02/17/24 1143 02/18/24 0433  WBC  --  4.9  HGB 12.6* 13.5  HCT 37.0* 38.8*  MCV  --  92.8  PLT  --  205   Basic Metabolic Panel Recent Labs    98/78/73 0433 02/19/24 0354   NA 139 141  K 3.9 4.3  CL 102 104  CO2 25 24  GLUCOSE 104* 119*  BUN 18 23*  CREATININE 1.13 1.20  CALCIUM  9.7 9.6   Liver Function Tests No results for input(s): AST, ALT, ALKPHOS, BILITOT, PROT, ALBUMIN in the last 72 hours.  No results for input(s): LIPASE, AMYLASE in the last 72 hours.  ProBNP (last 3 results) Recent Labs    02/10/24 1809  PROBNP 83.3   Medications:    Scheduled Medications:  acyclovir   800 mg Oral BID   amiodarone   200 mg Oral BID   carvedilol   3.125 mg Oral BID WC   entecavir   0.5 mg Oral Daily   fondaparinux  (ARIXTRA ) injection  2.5 mg Subcutaneous Q24H   lidocaine   1 patch Transdermal QHS   Ensure Max Protein  11 oz Oral BID   sacubitril -valsartan   1 tablet Oral BID   sodium chloride  flush  3 mL Intravenous Q12H   spironolactone   25 mg Oral Daily   sulfamethoxazole -trimethoprim   1 tablet Oral Once per day on Monday Wednesday Friday    Infusions:    PRN Medications: acetaminophen  **OR** acetaminophen  (TYLENOL ) oral liquid 160 mg/5 mL **OR** acetaminophen , acetaminophen , HYDROmorphone  (DILAUDID ) injection, melatonin, ondansetron  (ZOFRAN ) IV, ondansetron  (ZOFRAN ) IV, polyethylene glycol, senna, sodium chloride  flush  Assessment/Plan   Cardiac Arrest,  VT/VF,  Acute on chronic HFrEF: Found unresponsive by friend, no  bystander CPR. Shockable rhythm on EMS arrival, shock x1. Known EF 35-40%. In the setting of severe electrolytes derangements K 2.8 on admission, due to infectious process/diarrhea. He has norovirus, COVID, and rhinovirus.  - Echo read as 20-25% (30-35% on Dr. Charley read which is his baseline) - Initial concern for severe anoxic injury but extubated 1/16 and mental status improving but not back to baseline - Cath 1/20 no CAD. EF ?45-50%. RHC were ok.  - Echo today 122/26 EF 40-45% - Titrate GDMT as tolerated - continue coreg  3.125 mg bid - continue entresto  49/51 mg bid - continue spiro 25 gm daily - hold  on SGLT2i with low volume status for now - Suspect VT/VF related to CM and electrolyte abnormalities - LifeVest ordered, awaiting fitting - Repeat limited echo   Acute hypoxic resp failure, Rhinovius, COVID infection, ?CAP - on vent; mgmt per CCM - on zosyn /entecavir  - Extubated 1/16  Multiple Myeloma with Spinal METS - s/p chemo and stem cell transplant 2018 - recent recurrence s/p recent CAR-T and fludarabine /cytoxan  lymphodepletion at [facility] - on bactrim /acyclovir  for ppx after tx  Hypokalemia, Hypomagnesemia - supp as needed  Anoxic brain injury - close to baseline  PVCs - continue amio  Ok to discharge from HF standpoint once he has LifeVest and echo is complete.   Heart failure team will sign off as of 02/19/24  HF Team Medication Recommendations for Home: - amiodarone  200 mg bid - carvedilol  3.125 mg bid - entresto  49/51 mg bid - spirolactone 25 mg daily  He has follow up HF Clinic on 1/29 at 12pm.  Length of Stay: 9  [name], NP  02/19/2024, 9:19 AM  Advanced Heart Failure Team Pager [phone] (M-F; 7a - 5p)   Please visit [email or web address]: For overnight coverage please call cardiology fellow first. If fellow not available call Shock/ECMO MD on call.  For ECMO / Mechanical Support (Impella, IABP, LVAD) issues call Shock / ECMO MD on call.   Patient seen and examined with the above-signed Advanced Practice Provider and/or Housestaff. I personally reviewed laboratory data, imaging studies and relevant notes. I independently examined the patient and formulated the important aspects of the plan. I have edited the note to reflect any of my changes or salient points. I have personally discussed the plan with the patient and/or family.  Feels good. Eager to go home.  Wearing LifeVest, Echo EF 40-45%  General:  Sitting up in bed. No resp difficulty HEENT: normal Neck: supple. no JVD.  Cor: Regular rate & rhythm. No rubs, gallops or murmurs. Lungs: clear Abdomen:  soft, nontender, nondistended.Good bowel sounds. Extremities: no cyanosis, clubbing, rash, edema Neuro: alert conversant, cranial nerves grossly intact. moves all 4 extremities w/o difficulty. Affect pleasant  He is stable from cardiac perspective. EF improved. Ok for d/c home today with LifeVest. Will eventually need to f/u at [facility] for further coordinated care of his myeloma/AL amyloid and HF  Toribio Fuel, MD  10:04 PM  "

## 2024-02-19 NOTE — Progress Notes (Signed)
" °  Echocardiogram 2D Echocardiogram has been performed.  Koleen KANDICE Popper, RDCS 02/19/2024, 10:07 AM "

## 2024-02-19 NOTE — Discharge Summary (Signed)
 Physician Discharge Summary  Bradley Hunt FMW:[id number] DOB: 01/13/1971 DOA: 02/10/2024  PCP: Sim [name] CROME, MD  Admit date: 02/10/2024 Discharge date: 02/19/2024  Admitted From: Home Disposition:  Home  Discharge Condition:Stable CODE STATUS:FULL Diet recommendation: Heart Healthy   Brief/Interim Summary: Patient is a 54 year old male with history of multiple myeloma, HFrEF who was brought unresponsive to the emergency department.  EMS found him pulseless, CPR initiated.  Started  rhythm identified and he was shocked x 1.  Given CPR for 10 minutes.  Intubated in the ED for airway protection, admitted under PCCM service.  Cardiology/EP were following.  Has significantly improved clinically.  Hemodynamically stable today.  Cardiology cleared for discharge.  He will follow-up with hospital team as an outpatient  Following problems were addressed during the hospitalization:  Cardiac arrest/ventricular tachycardia: Unresponsive. EMS found him pulseless, CPR initiated.  Shockable    rhythm identified and he was shocked x 1.  Given CPR for 10 minutes.  Cardiac arrest potentially secondary to electrolyte abnormalities.  Potassium was 2.8 on admission.  Currently hemodynamically stable.   Chronic HFrEF: Echo of 20-35%.  CHF team following.  Underwent cardiac cath.  EP following. Plan to put on  LifeVest .  Currently on amiodarone , carvedilol , Entresto , spironolactone .  Holding SGLT2i due to low volume status.  Limited Echo being done today.  He has an appointment with cardiology team on 1/29   Acute hypoxic respiratory failure/Pseudomonas pneumonia: Intubated on arrival.  Currently extubated.  Currently on room air.  Completed antibiotics course.  Blood cultures showed Staph epidermidis, likely contamination. COVID-19/rhino virus incidentally came out to be positive.  Asymptomatic.  Denies shortness of breath or cough today.   Concern for seizure  activity/anoxic brain injury: underwent EEG.   Neurology does not think that he needs to be on antiepileptics.  Keppra  discontinued.  Mentation improving, mostly oriented   Norovirus/rotavirus positive: Diarrhea resolved.  Did recently travel to [place].   Normocytic anemia: Currently hemoglobin stable   Severe hypokalemia: Thought to be the  culprit for cardiac arrest.  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 S/p autologous stem cells 2018.  He follows with hematology/oncology Dr. Fernande at [facility] . He was  on  acyclovir  for HSV, Entecavir  for hepatitis B and also Bactrim  MWF.Plans for IVIG as OP on hold. Recommended him to follow-up with his oncologist as soon as possible   Patient seen by PT/ OT, outpatient follow-up recommended   Discharge Diagnoses:  Principal Problem:   Cardiac arrest [facility]) Active Problems:   On mechanically assisted ventilation (HCC)   Acute HFrEF (heart failure with reduced ejection fraction) [facility])    Discharge Instructions  Discharge Instructions     Ambulatory referral to Physical Therapy   Complete by: As directed    Physical Therapy evaluation and treatment   Diet - low sodium heart healthy   Complete by: As directed    Discharge instructions   Complete by: As directed    1)Please take your medications as instructed 2)Follow up with your PCP in a week 3)You have an appointment with cardiology on 1/29 4)Follow up with your hematologist/oncologist as soon as possible   Increase activity slowly   Complete by: As directed       Allergies as of 02/19/2024       Reactions   Daratumumab  Shortness Of Breath, Diarrhea, Itching, Hypertension   Only with IV daratumumab   Heparin  Other (See Comments)   For religious purposes, does not agree to Heparin  or other pork products   Porcine (pork) Protein-containing Drug Products    Religious reasons         Medication List     STOP taking these medications    acyclovir  400 MG tablet Commonly known as: ZOVIRAX  Replaced by: acyclovir  200 MG capsule   azithromycin  500 MG tablet Commonly known as: ZITHROMAX    empagliflozin 10 MG Tabs tablet Commonly known as: JARDIANCE   losartan  50 MG tablet Commonly known as: COZAAR        TAKE these medications    acetaminophen  500 MG tablet Commonly known as: TYLENOL  Take 500 mg by mouth at bedtime as needed for moderate pain (pain score 4-6) or mild pain (pain score 1-3).   acyclovir  200 MG capsule Commonly known as: ZOVIRAX  Take 4 capsules (800 mg total) by mouth 2 (two) times daily. Replaces: acyclovir  400 MG tablet   amiodarone  200 MG tablet Commonly known as: PACERONE  Take 1 tablet (200 mg total) by mouth 2 (two) times daily.   calcium  carbonate 500 MG chewable tablet Commonly known as: TUMS - dosed in mg elemental calcium  Chew 1 tablet by mouth 3 (three) times daily.   carvedilol  3.125 MG tablet Commonly known as: COREG  Take 1 tablet (3.125 mg total) by mouth 2 (two) times daily with a meal. What changed: when to take this   cholecalciferol 25 MCG (1000 UNIT) tablet Commonly known as: VITAMIN D3 Take 1,000 Units by mouth daily.   entecavir  0.5 MG tablet Commonly known as: BARACLUDE  Take 1 tablet (0.5 mg total) by mouth daily. Start taking on: February 20, 2024   folic acid 1 MG tablet Commonly known as: FOLVITE Take 1 mg by mouth daily.   Multi-Vitamin tablet Take 1 tablet by mouth daily.   ondansetron  8 MG tablet Commonly known as: Zofran  Take 1 tablet (8 mg total) by mouth every 8 (eight) hours as needed for nausea or vomiting.   sacubitril -valsartan  49-51 MG Commonly known as: ENTRESTO  Take 1 tablet by mouth 2 (two) times daily.   spironolactone  25 MG tablet Commonly known as: ALDACTONE  Take 1 tablet (25 mg total) by mouth daily.   sulfamethoxazole -trimethoprim  800-160 MG tablet Commonly known as:  BACTRIM  DS Take 1 tablet by mouth 3 (three) times a week. Start taking on: February 20, 2024   traMADol 50 MG tablet Commonly known as: ULTRAM Take 50 mg by mouth every 8 (eight) hours as needed for severe pain (pain score 7-10).               Durable Medical Equipment  (From admission, onward)           Start     Ordered   02/18/24 0913  For home use only DME Vest life vest  Once       Question Answer Comment  Indication: Cardiac Arrest due to VF or sustained VT   Life Vest Setting: VT Heart Rate Threshold - Default 150 BPM   Life Vest Setting: VF Heart Rate Threshold - Default 200 BPM   Life Vest Setting: Treatment Energy - Default 150 Joules, all five shocks   Length of need: 3 months   Start date: t      02/18/24 0912            Follow-up Information     [facility] Follow up.   Specialty: Rehabilitation Why: Physical Therapy evaluation and  treatment-office to call with viist times within 3-5 business days. If the office has not called; please feel free to call them. Contact information: [address] [place]  [phone]        [facility] Follow up on 02/26/2024.   Specialty: Cardiology Why: at 12:00 pm  [facility], Entrance C Free Valet Parking Available Contact information: [address] [place]  [phone]               Allergies[1]  Consultations: cardiology   Procedures/Studies: ECHOCARDIOGRAM LIMITED Result Date: 02/19/2024    ECHOCARDIOGRAM LIMITED REPORT   Patient Name:   Bradley Hunt Date of Exam: 02/19/2024 Medical Rec #:  [id number]     Height:       74.0 in Accession #:    [id number]    Weight:       222.0 lb Date of Birth:  [birth year]     BSA:          2.272 m Patient Age:    53 years      BP:           111/68 mmHg Patient Gender: M             HR:            82 bpm. Exam Location:  Inpatient Procedure: Limited Echo, Limited Color Doppler and Cardiac Doppler (Both            Spectral and Color Flow Doppler were utilized during procedure). Indications:    Ventricular Tachycardia I47.2  History:        Patient has prior history of Echocardiogram examinations, most                 recent 02/11/2024. Cardiomyopathy, Arrythmias:hx of tacycardia                 and PVC; Risk Factors:Hypertension.  Sonographer:    Koleen Popper RDCS Referring Phys: [name] IMPRESSIONS  1. Limited Echocardiogram.  2. Left ventricular ejection fraction, by estimation, is 40 to 45%. The left ventricle has mildly decreased function. The left ventricle demonstrates global hypokinesis. Left ventricular diastolic parameters are consistent with Grade I diastolic dysfunction (impaired relaxation).  3. Right ventricular systolic function is normal. There is normal pulmonary artery systolic pressure.  4. The mitral valve is degenerative. Trivial mitral valve regurgitation. No evidence of mitral stenosis.  5. The aortic valve is normal in structure. Aortic valve regurgitation is not visualized. No aortic stenosis is present.  6. The inferior vena cava is dilated in size with >50% respiratory variability, suggesting right atrial pressure of 8 mmHg. Comparison(s): A prior study was performed on 02/11/2024. LVEF 20-25%, global hypokinesis, Grade I diastolic dysfunction. FINDINGS  Left Ventricle: Left ventricular ejection fraction, by estimation, is 40 to 45%. The left ventricle has mildly decreased function. The left ventricle demonstrates global hypokinesis. The left ventricular internal cavity size was normal in size. There is  borderline left ventricular hypertrophy. Left ventricular diastolic parameters are consistent with Grade I diastolic dysfunction (impaired relaxation). Right Ventricle: Right ventricular systolic function is normal. There is normal pulmonary artery systolic pressure. The  tricuspid regurgitant velocity is 2.29 m/s, and with an assumed right atrial pressure of 3 mmHg, the estimated right ventricular systolic pressure is 24.0 mmHg. Pericardium: There is no evidence of pericardial effusion. Mitral Valve: The mitral valve is degenerative in appearance. Mild to moderate mitral annular  calcification. Trivial mitral valve regurgitation. No evidence of mitral valve stenosis. Tricuspid Valve: The tricuspid valve is grossly normal. Tricuspid valve regurgitation is mild . No evidence of tricuspid stenosis. Aortic Valve: The aortic valve is normal in structure. Aortic valve regurgitation is not visualized. No aortic stenosis is present. Pulmonic Valve: The pulmonic valve was grossly normal. Pulmonic valve regurgitation is trivial. No evidence of pulmonic stenosis. Aorta: The aortic root and ascending aorta are structurally normal, with no evidence of dilitation. Venous: The inferior vena cava is dilated in size with greater than 50% respiratory variability, suggesting right atrial pressure of 8 mmHg. Additional Comments: Spectral Doppler performed. Color Doppler performed.  LEFT VENTRICLE PLAX 2D LVIDd:         4.60 cm LVIDs:         3.60 cm LV PW:         1.20 cm LV IVS:        1.30 cm LVOT diam:     2.50 cm LV SV:         76 LV SV Index:   33 LVOT Area:     4.91 cm  LV Volumes (MOD) LV vol d, MOD A4C: 238.0 ml LV vol s, MOD A4C: 122.0 ml LV SV MOD A4C:     238.0 ml RIGHT VENTRICLE         IVC TAPSE (M-mode): 2.6 cm  IVC diam: 2.10 cm LEFT ATRIUM         Index LA diam:    2.70 cm 1.19 cm/m  AORTIC VALVE             PULMONIC VALVE LVOT Vmax:   90.60 cm/s  PR End Diast Vel: 1.57 msec LVOT Vmean:  59.400 cm/s LVOT VTI:    0.154 m  AORTA Ao Root diam: 3.90 cm Ao Asc diam:  3.70 cm MITRAL VALVE               TRICUSPID VALVE MV Area (PHT): 3.31 cm    TR Peak grad:   21.0 mmHg MV Decel Time: 229 msec    TR Vmax:        229.00 cm/s MR Peak grad: 60.5 mmHg MR Vmax:      389.00 cm/s  SHUNTS MV E  velocity: 35.10 cm/s  Systemic VTI:  0.15 m MV A velocity: 69.30 cm/s  Systemic Diam: 2.50 cm MV E/A ratio:  0.51 Sunit Tolia Electronically signed by Madonna Large Signature Date/Time: 02/19/2024/11:02:07 AM    Final    CARDIAC CATHETERIZATION Result Date: 02/17/2024   The left ventricular ejection fraction is 45-50% by visual estimate. Findings: Ao = 119/91 (105) LV = 123/3 RA = 2 RV = 21/5 PA = 26/9 (14) PCW = 3 Fick cardiac output/index = 5.8/2.6 Thermo CO/CI = 4.5/2.0 PVR = 2.6 (Fick) 2.0 (TD) Ao sat = 95% PA sat = 62%, 69% PAPi = 8.5 Assessment: 1. Normal coronaries 2. EF 45-50% by v-gram (35% by echo) 3. Low filling pressures 4. Normal outputs by Fick Plan/Discussion: Medical therapy Toribio Fuel, MD 11:57 AM  Overnight EEG with video Result Date: 02/12/2024 Shelton Arlin KIDD, MD     02/13/2024  8:53 AM Patient Name: Bradley Hunt MRN: [id number] Epilepsy Attending: Arlin KIDD Shelton Referring Physician/Provider: Khaliqdina, Salman, MD Duration: 02/11/2024 1035 to 02/12/2024 1035 Patient history: 54 year old male status post cardiac arrest. EEG to evaluate for seizure Level of alertness: comatose AEDs during EEG study: LEV, propofol  Technical aspects: This EEG study was done with scalp  electrodes positioned according to the 10-20 International system of electrode placement. Electrical activity was reviewed with band pass filter of 1-70Hz , sensitivity of 7 uV/mm, display speed of 26mm/sec with a 60Hz  notched filter applied as appropriate. EEG data were recorded continuously and digitally stored.  Video monitoring was available and reviewed as appropriate. Description: EEG showed continuous generalized 3-6hz  theta-delta slowing admixed with 12-14hz  beta activity. Hyperventilation and photic stimulation were not performed.   Event button was pressed on 02/11/2024 at 1942 for tremor like movements.  Concomitant EEG before, during and after the did not show any EEG changes to suggest seizure. ABNORMALITY -  Continuous slow, generalized IMPRESSION: This study is suggestive of generalized cerebral dysfunction (encephalopathy) nonspecific etiology but likely related to sedation. No seizures or epileptiform discharges were seen throughout the recording. Event button was pressed on 02/11/2024 at 1942 for tremor like movements without concomitant EEG change. This was most likely NOT an epileptic event. Arlin MALVA Krebs   ECHOCARDIOGRAM COMPLETE Result Date: 02/11/2024    ECHOCARDIOGRAM REPORT   Patient Name:   Bradley Hunt Date of Exam: 02/11/2024 Medical Rec #:  [id number]     Height:       74.0 in Accession #:    [id number]    Weight:       244.5 lb Date of Birth:  [birth year]     BSA:          2.367 m Patient Age:    53 years      BP:           88/66 mmHg Patient Gender: M             HR:           84 bpm. Exam Location:  Inpatient Procedure: 2D Echo (Both Spectral and Color Flow Doppler were utilized during            procedure). Indications:    cardiac arrest  History:        Patient has prior history of Echocardiogram examinations, most                 recent 05/15/2016. Cardiomyopathy, Signs/Symptoms:Fever; Risk                 Factors:Hypertension.  Sonographer:    Tinnie Barefoot RDCS Referring Phys: [id number] JARED E SEGAL IMPRESSIONS  1. Nodular thickening of aortic valve noted; no AI.  2. Left ventricular ejection fraction, by estimation, is 20 to 25%. The left ventricle has severely decreased function. The left ventricle demonstrates global hypokinesis. There is mild left ventricular hypertrophy. Left ventricular diastolic parameters  are consistent with Grade I diastolic dysfunction (impaired relaxation).  3. Right ventricular systolic function is normal. The right ventricular size is normal. There is normal pulmonary artery systolic pressure.  4. The mitral valve is normal in structure. Trivial mitral valve regurgitation. No evidence of mitral stenosis.  5. The aortic valve is tricuspid. Aortic valve  regurgitation is not visualized. Aortic valve sclerosis is present, with no evidence of aortic valve stenosis.  6. The inferior vena cava is normal in size with greater than 50% respiratory variability, suggesting right atrial pressure of 3 mmHg. Comparison(s): No prior Echocardiogram. FINDINGS  Left Ventricle: Left ventricular ejection fraction, by estimation, is 20 to 25%. The left ventricle has severely decreased function. The left ventricle demonstrates global hypokinesis. The left ventricular internal cavity size was normal in size. There is mild left ventricular hypertrophy. Left ventricular diastolic parameters are consistent with  Grade I diastolic dysfunction (impaired relaxation). Right Ventricle: The right ventricular size is normal. Right ventricular systolic function is normal. There is normal pulmonary artery systolic pressure. The tricuspid regurgitant velocity is 2.42 m/s, and with an assumed right atrial pressure of 3 mmHg,  the estimated right ventricular systolic pressure is 26.4 mmHg. Left Atrium: Left atrial size was normal in size. Right Atrium: Right atrial size was normal in size. Pericardium: There is no evidence of pericardial effusion. Mitral Valve: The mitral valve is normal in structure. Trivial mitral valve regurgitation. No evidence of mitral valve stenosis. Tricuspid Valve: The tricuspid valve is normal in structure. Tricuspid valve regurgitation is trivial. No evidence of tricuspid stenosis. Aortic Valve: The aortic valve is tricuspid. Aortic valve regurgitation is not visualized. Aortic valve sclerosis is present, with no evidence of aortic valve stenosis. Pulmonic Valve: The pulmonic valve was normal in structure. Pulmonic valve regurgitation is trivial. No evidence of pulmonic stenosis. Aorta: The aortic root is normal in size and structure. Venous: The inferior vena cava is normal in size with greater than 50% respiratory variability, suggesting right atrial pressure of 3 mmHg.  IAS/Shunts: No atrial level shunt detected by color flow Doppler. Additional Comments: Nodular thickening of aortic valve noted; no AI.  LEFT VENTRICLE PLAX 2D LVIDd:         5.10 cm      Diastology LVIDs:         4.10 cm      LV e' medial:    4.90 cm/s LV PW:         1.10 cm      LV E/e' medial:  7.7 LV IVS:        1.30 cm      LV e' lateral:   9.57 cm/s LVOT diam:     2.60 cm      LV E/e' lateral: 3.9 LV SV:         84 LV SV Index:   36 LVOT Area:     5.31 cm LV IVRT:       109 msec  LV Volumes (MOD) LV vol d, MOD A2C: 119.0 ml LV vol d, MOD A4C: 126.0 ml LV vol s, MOD A2C: 75.7 ml LV vol s, MOD A4C: 81.3 ml LV SV MOD A2C:     43.3 ml LV SV MOD A4C:     126.0 ml LV SV MOD BP:      44.2 ml RIGHT VENTRICLE             IVC RV Basal diam:  2.90 cm     IVC diam: 1.70 cm RV S prime:     14.60 cm/s TAPSE (M-mode): 1.6 cm LEFT ATRIUM             Index LA diam:        3.50 cm 1.48 cm/m LA Vol (A2C):   44.2 ml 18.67 ml/m LA Vol (A4C):   41.3 ml 17.45 ml/m LA Biplane Vol: 43.2 ml 18.25 ml/m  AORTIC VALVE LVOT Vmax:   88.60 cm/s LVOT Vmean:  59.700 cm/s LVOT VTI:    0.159 m  AORTA Ao Root diam: 3.80 cm Ao Asc diam:  3.60 cm MV E velocity: 37.70 cm/s  TRICUSPID VALVE MV A velocity: 76.00 cm/s  TR Peak grad:   23.4 mmHg MV E/A ratio:  0.50        TR Vmax:        242.00 cm/s  SHUNTS                            Systemic VTI:  0.16 m                            Systemic Diam: 2.60 cm Redell Shallow MD Electronically signed by Redell Shallow MD Signature Date/Time: 02/11/2024/4:53:13 PM    Final    DG CHEST PORT 1 VIEW Result Date: 02/10/2024 EXAM: 1 VIEW(S) XRAY OF THE CHEST 02/10/2024 10:52:00 PM COMPARISON: CT chest dated 02/10/2024 07:27 PM. CLINICAL HISTORY: [id number] History of ETT [id number] History of endotracheal tube (ETT) placement. FINDINGS: LINES, TUBES AND DEVICES: Endotracheal tube terminates 1 cm over the carina. Enteric tube courses below the hemidiaphragm with side port overlying the  gastric lumen region and tip collimated off view. LUNGS AND PLEURA: Low lung volumes. Bibasilar atelectasis. No pleural effusion. No pneumothorax. HEART AND MEDIASTINUM: No acute abnormality of the cardiac and mediastinal silhouettes. BONES AND SOFT TISSUES: No acute osseous abnormality. IMPRESSION: 1. Endotracheal tube tip terminates 1 cm above the carina and could be retracted by 1cm. 2. Enteric tube in godo position 3. Low lung volumes with bibasilar atelectasis. Electronically signed by: Morgane Naveau MD 02/10/2024 10:57 PM EST RP Workstation: [id number]   CT ANGIO HEAD NECK W WO CM Result Date: 02/10/2024 CLINICAL DATA:  Initial evaluation for acute altered mental status, prior arrest. EXAM: CT ANGIOGRAPHY HEAD AND NECK WITH AND WITHOUT CONTRAST TECHNIQUE: Multidetector CT imaging of the head and neck was performed using the standard protocol during bolus administration of intravenous contrast. Multiplanar CT image reconstructions and MIPs were obtained to evaluate the vascular anatomy. Carotid stenosis measurements (when applicable) are obtained utilizing NASCET criteria, using the distal internal carotid diameter as the denominator. RADIATION DOSE REDUCTION: This exam was performed according to the departmental dose-optimization program which includes automated exposure control, adjustment of the mA and/or kV according to patient size and/or use of iterative reconstruction technique. CONTRAST:  75mL OMNIPAQUE  IOHEXOL  350 MG/ML SOLN COMPARISON:  Prior exam from 10/01/2011. FINDINGS: CT HEAD FINDINGS Brain: Cerebral volume within normal limits for patient age. No acute intracranial hemorrhage. No acute large vessel territory infarct. No mass lesion, midline shift, or mass effect. Ventricles are normal in size without hydrocephalus. No extra-axial fluid collection. Vascular: No abnormal hyperdense vessel. Skull: Scalp soft tissues demonstrate no acute abnormality. Calvarium intact. Sinuses/Orbits: Globes and  orbital soft tissues within normal limits. Moderate mucosal thickening throughout the visualized sphenoid ethmoidal sinuses. No significant mastoid effusion. Patient is intubated. CTA NECK FINDINGS Aortic arch: Standard branching. Imaged portion shows no evidence of aneurysm or dissection. No significant stenosis of the major arch vessel origins. Right carotid system: No evidence of dissection, stenosis (50% or greater), or occlusion. Left carotid system: No evidence of dissection, stenosis (50% or greater), or occlusion. Vertebral arteries: Neither vertebral artery well seen proximally due to streak artifact. Right vertebral artery slightly dominant. Visualized vertebral arteries patent without stenosis or dissection. Skeleton: Multiple lucent lesion seen throughout the visualized spine, consistent with history of multiple myeloma. Mild chronic height loss about the T3 and T4 vertebral bodies. Other neck: Endotracheal and enteric tubes in place. The balloon for the endotracheal to appears over inflated at the supraglottic larynx (series 2, image 66). Upper chest: Right lower lobe atelectasis and/or consolidation. Mild subsegmental atelectasis at the dependent left lung. No other acute finding. Review of the MIP  images confirms the above findings CTA HEAD FINDINGS Anterior circulation: Evaluation of the intracranial circulation limited by timing of the contrast bolus. Both internal carotid arteries are patent to the siphons without stenosis or other visible abnormality. A1 segments patent bilaterally. Normal anterior communicating complex. Azygous ACA noted. ACAs patent without visible stenosis. No M1 stenosis or occlusion. No proximal MCA branch occlusion or high-grade stenosis. Distal MCA branches perfused and symmetric. Posterior circulation: Both V4 segments patent without significant stenosis. Right vertebral artery slightly dominant. Neither PICA well visualized. Basilar patent without stenosis. Superior  cerebral arteries patent at their origins. Both PCAs patent without visible stenosis. Venous sinuses: Grossly patent allowing for timing the contrast bolus. Anatomic variants: Predominant azygous ACA.  No visible aneurysm. Review of the MIP images confirms the above findings IMPRESSION: CT HEAD: No acute intracranial abnormality. CTA HEAD AND NECK: 1. Negative CTA of the head and neck. No large vessel occlusion or other emergent finding. No hemodynamically significant or correctable stenosis. 2. Multiple lucent lesions throughout the visualized osseous structures, consistent with history of multiple myeloma. 3. Endotracheal and enteric tubes in place. The balloon for the endotracheal tube appears over inflated at the supraglottic larynx. Bedside check and adjustment recommended. 4. Right lower lobe atelectasis and/or consolidation. Electronically Signed   By: Morene Hoard M.D.   On: 02/10/2024 21:31   CT Angio Chest PE W and/or Wo Contrast Result Date: 02/10/2024 EXAM: CTA of the Chest with contrast for PE 02/10/2024 07:58:00 PM TECHNIQUE: CTA of the chest was performed after the administration of 75 mL of Omnipaque  350 MG/ML injection. Multiplanar reformatted images are provided for review. MIP images are provided for review. Automated exposure control, iterative reconstruction, and/or weight based adjustment of the mA/kV was utilized to reduce the radiation dose to as low as reasonably achievable. COMPARISON: None available. CLINICAL HISTORY: Recent CPR with chest pain, initial encounter. FINDINGS: PULMONARY ARTERIES: Degree of pulmonary arterial opacification is somewhat limited, although no large central pulmonary embolus is seen. Main pulmonary artery is normal in caliber. MEDIASTINUM: Endotracheal tube and nasogastric catheter are noted in satisfactory position. The heart is not significantly enlarged in size. The pericardium demonstrates no acute abnormality. The aorta shows no aneurysmal  dilatation. The esophagus, as visualized, is unremarkable. LYMPH NODES: No mediastinal, hilar or axillary lymphadenopathy. LUNGS AND PLEURA: The lungs demonstrate bilateral lower lobe consolidation, right greater than left. Posterior consolidation in the right upper lobe is noted as well. No parenchymal nodules are noted. No sizable effusion is seen. No pneumothorax. UPPER ABDOMEN: Limited images of the upper abdomen are unremarkable. SOFT TISSUES AND BONES: Compression deformities are noted at T4 and T6, consistent with the patient's given clinical history of multiple myeloma. Additionally, changes of prior T6 laminectomy are noted. Scattered lucencies are seen, consistent with the patient's given clinical history. No acute soft tissue abnormality. IMPRESSION: 1. No large central pulmonary embolus is seen, although pulmonary arterial opacification is somewhat limited. 2. Bilateral lower lobe consolidation, right greater than left, and posterior consolidation in the right upper lobe, without sizable pleural effusion. Electronically signed by: Oneil Devonshire MD 02/10/2024 08:25 PM EST RP Workstation: [id number] [email or web address] Abd Portable 1 View Result Date: 02/10/2024 CLINICAL DATA:  Enteric catheter placement EXAM: PORTABLE ABDOMEN - 1 VIEW COMPARISON:  None Available. FINDINGS: Frontal view of the upper abdomen was obtained. Enteric catheter tip and side port project over the gastric body. Bowel gas pattern is unremarkable. IMPRESSION: 1. Enteric catheter tip projecting over the gastric body. Electronically  Signed   By: Ozell Daring M.D.   On: 02/10/2024 19:43   DG Chest Port 1 View Result Date: 02/10/2024 CLINICAL DATA:  Intubated EXAM: PORTABLE CHEST 1 VIEW COMPARISON:  06/24/2019 FINDINGS: Single frontal view of the chest demonstrates endotracheal tube overlying tracheal air column, with tip projecting 10 cm above the carina. Recommend advancing 6-7 cm. Enteric catheter passes below diaphragm, tip excluded by  collimation but side port projecting over the gastric fundus. The cardiac silhouette is unremarkable. Areas of linear consolidation are seen within the right upper and left lower lobes, favoring subsegmental atelectasis. No airspace disease, effusion, or pneumothorax. No acute bony abnormalities. IMPRESSION: 1. Endotracheal tube tip projecting 10 cm above carina. Recommend advancing 6-7 cm. 2. Bilateral hypoventilatory changes.  No acute airspace disease. Electronically Signed   By: Ozell Daring M.D.   On: 02/10/2024 19:41      Subjective: Patient seen and examined at bedside today.  Hemodynamically stable.  Very comfortable.  On room air.  Denies shortness of breath or chest pain.  No diarrhea.  Medically stable for discharge after LifeVest placement  Discharge Exam: Vitals:   02/19/24 0429 02/19/24 0900  BP: 117/70 111/68  Pulse:  75  Resp: 18 16  Temp: 98.7 F (37.1 C) 98.3 F (36.8 C)  SpO2:  99%   Vitals:   02/18/24 1936 02/19/24 0015 02/19/24 0429 02/19/24 0900  BP: 101/71 98/68 117/70 111/68  Pulse:    75  Resp:   18 16  Temp: 97.8 F (36.6 C) 97.7 F (36.5 C) 98.7 F (37.1 C) 98.3 F (36.8 C)  TempSrc: Oral Oral Oral Oral  SpO2:    99%  Weight:   100.7 kg   Height:        General: Pt is alert, awake, not in acute distress Cardiovascular: RRR, S1/S2 +, no rubs, no gallops Respiratory: CTA bilaterally, no wheezing, no rhonchi Abdominal: Soft, NT, ND, bowel sounds + Extremities: no edema, no cyanosis    The results of significant diagnostics from this hospitalization (including imaging, microbiology, ancillary and laboratory) are listed below for reference.     Microbiology: Recent Results (from the past 240 hours)  Resp panel by RT-PCR (RSV, Flu A&B, Covid) Anterior Nasal Swab     Status: Abnormal   Collection Time: 02/10/24  6:29 PM   Specimen: Anterior Nasal Swab  Result Value Ref Range Status   SARS Coronavirus 2 by RT PCR POSITIVE (A) NEGATIVE Final    Influenza A by PCR NEGATIVE NEGATIVE Final   Influenza B by PCR NEGATIVE NEGATIVE Final    Comment: (NOTE) The Xpert Xpress SARS-CoV-2/FLU/RSV plus assay is intended as an aid in the diagnosis of influenza from Nasopharyngeal swab specimens and should not be used as a sole basis for treatment. Nasal washings and aspirates are unacceptable for Xpert Xpress SARS-CoV-2/FLU/RSV testing.  Fact Sheet for Patients: bloggercourse.com  Fact Sheet for Healthcare Providers: seriousbroker.it  This test is not yet approved or cleared by the [place]  FDA and has been authorized for detection and/or diagnosis of SARS-CoV-2 by FDA under an Emergency Use Authorization (EUA). This EUA will remain in effect (meaning this test can be used) for the duration of the COVID-19 declaration under Section 564(b)(1) of the Act, 21 U.S.C. section 360bbb-3(b)(1), unless the authorization is terminated or revoked.     Resp Syncytial Virus by PCR NEGATIVE NEGATIVE Final    Comment: (NOTE) Fact Sheet for Patients: bloggercourse.com  Fact Sheet for Healthcare Providers: seriousbroker.it  This test is not yet approved or cleared by the [place]  FDA and has been authorized for detection and/or diagnosis of SARS-CoV-2 by FDA under an Emergency Use Authorization (EUA). This EUA will remain in effect (meaning this test can be used) for the duration of the COVID-19 declaration under Section 564(b)(1) of the Act, 21 U.S.C. section 360bbb-3(b)(1), unless the authorization is terminated or revoked.  Performed at [facility], [address], [place], [place] [address]   Culture, Respiratory w Gram Stain     Status: None   Collection Time: 02/10/24  9:10 PM   Specimen: Tracheal Aspirate; Respiratory  Result Value Ref Range Status   Specimen Description TRACHEAL ASPIRATE  Final   Special Requests NONE   Final   Gram Stain   Final    FEW WBC PRESENT, PREDOMINANTLY PMN NO ORGANISMS SEEN Performed at [facility], [address], [place], [place] [address]    Culture FEW PSEUDOMONAS AERUGINOSA  Final   Report Status 02/14/2024 FINAL  Final   Organism ID, Bacteria PSEUDOMONAS AERUGINOSA  Final      Susceptibility   Pseudomonas aeruginosa - MIC*    MEROPENEM 1 SENSITIVE Sensitive     CIPROFLOXACIN 0.25 SENSITIVE Sensitive     IMIPENEM 2 SENSITIVE Sensitive     PIP/TAZO Value in next row Sensitive      8 SENSITIVEThis is a modified FDA-approved test that has been validated and its performance characteristics determined by the reporting laboratory.  This laboratory is certified under the Clinical Laboratory Improvement Amendments CLIA as qualified to perform high complexity clinical laboratory testing.    CEFEPIME Value in next row Sensitive      8 SENSITIVEThis is a modified FDA-approved test that has been validated and its performance characteristics determined by the reporting laboratory.  This laboratory is certified under the Clinical Laboratory Improvement Amendments CLIA as qualified to perform high complexity clinical laboratory testing.    CEFTAZIDIME/AVIBACTAM Value in next row Sensitive      8 SENSITIVEThis is a modified FDA-approved test that has been validated and its performance characteristics determined by the reporting laboratory.  This laboratory is certified under the Clinical Laboratory Improvement Amendments CLIA as qualified to perform high complexity clinical laboratory testing.    CEFTOLOZANE/TAZOBACTAM Value in next row Sensitive      8 SENSITIVEThis is a modified FDA-approved test that has been validated and its performance characteristics determined by the reporting laboratory.  This laboratory is certified under the Clinical Laboratory Improvement Amendments CLIA as qualified to perform high complexity clinical laboratory testing.    TOBRAMYCIN Value in next row  Sensitive      8 SENSITIVEThis is a modified FDA-approved test that has been validated and its performance characteristics determined by the reporting laboratory.  This laboratory is certified under the Clinical Laboratory Improvement Amendments CLIA as qualified to perform high complexity clinical laboratory testing.    CEFTAZIDIME Value in next row Sensitive      8 SENSITIVEThis is a modified FDA-approved test that has been validated and its performance characteristics determined by the reporting laboratory.  This laboratory is certified under the Clinical Laboratory Improvement Amendments CLIA as qualified to perform high complexity clinical laboratory testing.    * FEW PSEUDOMONAS AERUGINOSA  MRSA Next Gen by PCR, Nasal     Status: None   Collection Time: 02/10/24 10:23 PM   Specimen: Nasal Mucosa; Nasal Swab  Result Value Ref Range Status   MRSA by PCR Next  Gen NOT DETECTED NOT DETECTED Final    Comment: (NOTE) The GeneXpert MRSA Assay (FDA approved for NASAL specimens only), is one component of a comprehensive MRSA colonization surveillance program. It is not intended to diagnose MRSA infection nor to guide or monitor treatment for MRSA infections. Test performance is not FDA approved in patients less than 106 years old. Performed at [facility], [address], [place], [place] [address]   Culture, blood (Routine X 2) w Reflex to ID Panel     Status: None   Collection Time: 02/10/24 11:14 PM   Specimen: BLOOD LEFT ARM  Result Value Ref Range Status   Specimen Description BLOOD LEFT ARM  Final   Special Requests   Final    BOTTLES DRAWN AEROBIC AND ANAEROBIC Blood Culture adequate volume   Culture   Final    NO GROWTH 5 DAYS Performed at [facility], [address] [place], [place] [address]    Report Status 02/15/2024 FINAL  Final  Culture, blood (Routine X 2) w Reflex to ID Panel     Status: Abnormal   Collection Time: 02/10/24 11:25 PM   Specimen: BLOOD LEFT  HAND  Result Value Ref Range Status   Specimen Description BLOOD LEFT HAND  Final   Special Requests   Final    BOTTLES DRAWN AEROBIC AND ANAEROBIC Blood Culture adequate volume   Culture  Setup Time   Final    GRAM POSITIVE COCCI ANAEROBIC BOTTLE ONLY CRITICAL RESULT CALLED TO, READ BACK BY AND VERIFIED WITH: PHARMD E. REOME [id number] @ 1939 FH    Culture (A)  Final    STAPHYLOCOCCUS EPIDERMIDIS THE SIGNIFICANCE OF ISOLATING THIS ORGANISM FROM A SINGLE SET OF BLOOD CULTURES WHEN MULTIPLE SETS ARE DRAWN IS UNCERTAIN. PLEASE NOTIFY THE MICROBIOLOGY DEPARTMENT WITHIN ONE WEEK IF SPECIATION AND SENSITIVITIES ARE REQUIRED. Performed at [facility], [address] [place], [place] [address]    Report Status 02/13/2024 FINAL  Final  Blood Culture ID Panel (Reflexed)     Status: Abnormal   Collection Time: 02/10/24 11:25 PM  Result Value Ref Range Status   Enterococcus faecalis NOT DETECTED NOT DETECTED Final   Enterococcus Faecium NOT DETECTED NOT DETECTED Final   Listeria monocytogenes NOT DETECTED NOT DETECTED Final   Staphylococcus species DETECTED (A) NOT DETECTED Final    Comment: CRITICAL RESULT CALLED TO, READ BACK BY AND VERIFIED WITH: PHARMD E. REOME [id number] @ 1939 FH    Staphylococcus aureus (BCID) NOT DETECTED NOT DETECTED Final   Staphylococcus epidermidis DETECTED (A) NOT DETECTED Final    Comment: Methicillin (oxacillin) resistant coagulase negative staphylococcus. Possible blood culture contaminant (unless isolated from more than one blood culture draw or clinical case suggests pathogenicity). No antibiotic treatment is indicated for blood  culture contaminants. CRITICAL RESULT CALLED TO, READ BACK BY AND VERIFIED WITH: PHARMD E. REOME [id number] @ 1940 FH    Staphylococcus lugdunensis NOT DETECTED NOT DETECTED Final   Streptococcus species NOT DETECTED NOT DETECTED Final   Streptococcus agalactiae NOT DETECTED NOT DETECTED Final   Streptococcus pneumoniae NOT DETECTED  NOT DETECTED Final   Streptococcus pyogenes NOT DETECTED NOT DETECTED Final   A.calcoaceticus-baumannii NOT DETECTED NOT DETECTED Final   Bacteroides fragilis NOT DETECTED NOT DETECTED Final   Enterobacterales NOT DETECTED NOT DETECTED Final   Enterobacter cloacae complex NOT DETECTED NOT DETECTED Final   Escherichia coli NOT DETECTED NOT DETECTED Final   Klebsiella aerogenes NOT DETECTED NOT DETECTED Final  Klebsiella oxytoca NOT DETECTED NOT DETECTED Final   Klebsiella pneumoniae NOT DETECTED NOT DETECTED Final   Proteus species NOT DETECTED NOT DETECTED Final   Salmonella species NOT DETECTED NOT DETECTED Final   Serratia marcescens NOT DETECTED NOT DETECTED Final   Haemophilus influenzae NOT DETECTED NOT DETECTED Final   Neisseria meningitidis NOT DETECTED NOT DETECTED Final   Pseudomonas aeruginosa NOT DETECTED NOT DETECTED Final   Stenotrophomonas maltophilia NOT DETECTED NOT DETECTED Final   Candida albicans NOT DETECTED NOT DETECTED Final   Candida auris NOT DETECTED NOT DETECTED Final   Candida glabrata NOT DETECTED NOT DETECTED Final   Candida krusei NOT DETECTED NOT DETECTED Final   Candida parapsilosis NOT DETECTED NOT DETECTED Final   Candida tropicalis NOT DETECTED NOT DETECTED Final   Cryptococcus neoformans/gattii NOT DETECTED NOT DETECTED Final   Methicillin resistance mecA/C DETECTED (A) NOT DETECTED Final    Comment: CRITICAL RESULT CALLED TO, READ BACK BY AND VERIFIED WITH: MAYA DRAFTS REOME [id number] @ 1940 FH Performed at [facility], [address] [place], [place] [address]      Labs: BNP (last 3 results) No results for input(s): BNP in the last 8760 hours. Basic Metabolic Panel: Recent Labs  Lab 02/13/24 0222 02/14/24 0723 02/15/24 0319 02/16/24 0807 02/17/24 0433 02/17/24 1124 02/17/24 1131 02/17/24 1143 02/18/24 0433 02/19/24 0354  NA 142 140 142 139 140 141 140 140 139 141   K 3.6 5.0 3.3* 4.0 4.0 3.4* 3.8 3.7 3.9 4.3  CL 112* 109 104  103 103  --   --   --  102 104  CO2 21* 16* 25 26 24   --   --   --  25 24  GLUCOSE 101* 104* 112* 106* 100*  --   --   --  104* 119*  BUN 19 15 12 10 15   --   --   --  18 23*  CREATININE 1.05 0.85 0.92 0.97 1.06  --   --   --  1.13 1.20  CALCIUM  8.8* 9.2 9.4 10.0 9.6  --   --   --  9.7 9.6  MG 2.2 2.0 1.8 2.2  --   --   --   --   --   --   PHOS 3.2 4.1 3.7  --   --   --   --   --   --   --    Liver Function Tests: Recent Labs  Lab 02/13/24 0222 02/14/24 0723 02/15/24 0319  AST 23 27 24   ALT 48* 43 41  ALKPHOS 52 57 57  BILITOT 0.5 0.6 0.6  PROT 5.2* 5.6* 5.9*  ALBUMIN 3.3* 3.4* 3.7   No results for input(s): LIPASE, AMYLASE in the last 168 hours. No results for input(s): AMMONIA in the last 168 hours. CBC: Recent Labs  Lab 02/13/24 0222 02/14/24 1134 02/15/24 0319 02/16/24 0807 02/17/24 1124 02/17/24 1131 02/17/24 1143 02/18/24 0433  WBC 3.5* 5.4 3.6* 3.4*  --   --   --  4.9  HGB 10.2* 12.4* 11.5* 12.7* 12.2* 13.3 12.6* 13.5  HCT 30.4* 35.9* 32.2* 36.2* 36.0* 39.0 37.0* 38.8*  MCV 96.2 94.5 91.2 91.6  --   --   --  92.8  PLT 144* 173 162 162  --   --   --  205   Cardiac Enzymes: No results for input(s): CKTOTAL, CKMB, CKMBINDEX, TROPONINI in the last 168 hours. BNP: Invalid input(s): POCBNP CBG: Recent Labs  Lab 02/13/24  1516 02/13/24 1927 02/13/24 2322 02/14/24 0307 02/14/24 0729  GLUCAP 80 115* 102* 114* 108*   D-Dimer No results for input(s): DDIMER in the last 72 hours. Hgb A1c No results for input(s): HGBA1C in the last 72 hours. Lipid Profile No results for input(s): CHOL, HDL, LDLCALC, TRIG, CHOLHDL, LDLDIRECT in the last 72 hours. Thyroid function studies No results for input(s): TSH, T4TOTAL, T3FREE, THYROIDAB in the last 72 hours.  Invalid input(s): FREET3 Anemia work up No results for input(s): VITAMINB12, FOLATE, FERRITIN, TIBC, IRON, RETICCTPCT in the last 72 hours. Urinalysis No  results found for: COLORURINE, APPEARANCEUR, LABSPEC, PHURINE, GLUCOSEU, HGBUR, BILIRUBINUR, KETONESUR, PROTEINUR, UROBILINOGEN, NITRITE, LEUKOCYTESUR Sepsis Labs Recent Labs  Lab 02/14/24 1134 02/15/24 0319 02/16/24 0807 02/18/24 0433  WBC 5.4 3.6* 3.4* 4.9   Microbiology Recent Results (from the past 240 hours)  Resp panel by RT-PCR (RSV, Flu A&B, Covid) Anterior Nasal Swab     Status: Abnormal   Collection Time: 02/10/24  6:29 PM   Specimen: Anterior Nasal Swab  Result Value Ref Range Status   SARS Coronavirus 2 by RT PCR POSITIVE (A) NEGATIVE Final   Influenza A by PCR NEGATIVE NEGATIVE Final   Influenza B by PCR NEGATIVE NEGATIVE Final    Comment: (NOTE) The Xpert Xpress SARS-CoV-2/FLU/RSV plus assay is intended as an aid in the diagnosis of influenza from Nasopharyngeal swab specimens and should not be used as a sole basis for treatment. Nasal washings and aspirates are unacceptable for Xpert Xpress SARS-CoV-2/FLU/RSV testing.  Fact Sheet for Patients: bloggercourse.com  Fact Sheet for Healthcare Providers: seriousbroker.it  This test is not yet approved or cleared by the [place]  FDA and has been authorized for detection and/or diagnosis of SARS-CoV-2 by FDA under an Emergency Use Authorization (EUA). This EUA will remain in effect (meaning this test can be used) for the duration of the COVID-19 declaration under Section 564(b)(1) of the Act, 21 U.S.C. section 360bbb-3(b)(1), unless the authorization is terminated or revoked.     Resp Syncytial Virus by PCR NEGATIVE NEGATIVE Final    Comment: (NOTE) Fact Sheet for Patients: bloggercourse.com  Fact Sheet for Healthcare Providers: seriousbroker.it  This test is not yet approved or cleared by the [place]  FDA and has been authorized for detection and/or diagnosis of SARS-CoV-2  by FDA under an Emergency Use Authorization (EUA). This EUA will remain in effect (meaning this test can be used) for the duration of the COVID-19 declaration under Section 564(b)(1) of the Act, 21 U.S.C. section 360bbb-3(b)(1), unless the authorization is terminated or revoked.  Performed at [facility], [address], [place], [place] [address]   Culture, Respiratory w Gram Stain     Status: None   Collection Time: 02/10/24  9:10 PM   Specimen: Tracheal Aspirate; Respiratory  Result Value Ref Range Status   Specimen Description TRACHEAL ASPIRATE  Final   Special Requests NONE  Final   Gram Stain   Final    FEW WBC PRESENT, PREDOMINANTLY PMN NO ORGANISMS SEEN Performed at [facility], [address], [place], [place] [address]    Culture FEW PSEUDOMONAS AERUGINOSA  Final   Report Status 02/14/2024 FINAL  Final   Organism ID, Bacteria PSEUDOMONAS AERUGINOSA  Final      Susceptibility   Pseudomonas aeruginosa - MIC*    MEROPENEM 1 SENSITIVE Sensitive     CIPROFLOXACIN 0.25 SENSITIVE Sensitive     IMIPENEM 2 SENSITIVE Sensitive     PIP/TAZO Value in next  row Sensitive      8 SENSITIVEThis is a modified FDA-approved test that has been validated and its performance characteristics determined by the reporting laboratory.  This laboratory is certified under the Clinical Laboratory Improvement Amendments CLIA as qualified to perform high complexity clinical laboratory testing.    CEFEPIME Value in next row Sensitive      8 SENSITIVEThis is a modified FDA-approved test that has been validated and its performance characteristics determined by the reporting laboratory.  This laboratory is certified under the Clinical Laboratory Improvement Amendments CLIA as qualified to perform high complexity clinical laboratory testing.    CEFTAZIDIME/AVIBACTAM Value in next row Sensitive      8 SENSITIVEThis is a modified FDA-approved test that has been validated and its performance  characteristics determined by the reporting laboratory.  This laboratory is certified under the Clinical Laboratory Improvement Amendments CLIA as qualified to perform high complexity clinical laboratory testing.    CEFTOLOZANE/TAZOBACTAM Value in next row Sensitive      8 SENSITIVEThis is a modified FDA-approved test that has been validated and its performance characteristics determined by the reporting laboratory.  This laboratory is certified under the Clinical Laboratory Improvement Amendments CLIA as qualified to perform high complexity clinical laboratory testing.    TOBRAMYCIN Value in next row Sensitive      8 SENSITIVEThis is a modified FDA-approved test that has been validated and its performance characteristics determined by the reporting laboratory.  This laboratory is certified under the Clinical Laboratory Improvement Amendments CLIA as qualified to perform high complexity clinical laboratory testing.    CEFTAZIDIME Value in next row Sensitive      8 SENSITIVEThis is a modified FDA-approved test that has been validated and its performance characteristics determined by the reporting laboratory.  This laboratory is certified under the Clinical Laboratory Improvement Amendments CLIA as qualified to perform high complexity clinical laboratory testing.    * FEW PSEUDOMONAS AERUGINOSA  MRSA Next Gen by PCR, Nasal     Status: None   Collection Time: 02/10/24 10:23 PM   Specimen: Nasal Mucosa; Nasal Swab  Result Value Ref Range Status   MRSA by PCR Next Gen NOT DETECTED NOT DETECTED Final    Comment: (NOTE) The GeneXpert MRSA Assay (FDA approved for NASAL specimens only), is one component of a comprehensive MRSA colonization surveillance program. It is not intended to diagnose MRSA infection nor to guide or monitor treatment for MRSA infections. Test performance is not FDA approved in patients less than 10 years old. Performed at [facility], [address] [place],  [place] [address]   Culture, blood (Routine X 2) w Reflex to ID Panel     Status: None   Collection Time: 02/10/24 11:14 PM   Specimen: BLOOD LEFT ARM  Result Value Ref Range Status   Specimen Description BLOOD LEFT ARM  Final   Special Requests   Final    BOTTLES DRAWN AEROBIC AND ANAEROBIC Blood Culture adequate volume   Culture   Final    NO GROWTH 5 DAYS Performed at [facility], [address] [place], [place] [address]    Report Status 02/15/2024 FINAL  Final  Culture, blood (Routine X 2) w Reflex to ID Panel     Status: Abnormal   Collection Time: 02/10/24 11:25 PM   Specimen: BLOOD LEFT HAND  Result Value Ref Range Status   Specimen Description BLOOD LEFT HAND  Final   Special Requests   Final  BOTTLES DRAWN AEROBIC AND ANAEROBIC Blood Culture adequate volume   Culture  Setup Time   Final    GRAM POSITIVE COCCI ANAEROBIC BOTTLE ONLY CRITICAL RESULT CALLED TO, READ BACK BY AND VERIFIED WITH: PHARMD E. REOME [id number] @ 1940 FH    Culture (A)  Final    STAPHYLOCOCCUS EPIDERMIDIS THE SIGNIFICANCE OF ISOLATING THIS ORGANISM FROM A SINGLE SET OF BLOOD CULTURES WHEN MULTIPLE SETS ARE DRAWN IS UNCERTAIN. PLEASE NOTIFY THE MICROBIOLOGY DEPARTMENT WITHIN ONE WEEK IF SPECIATION AND SENSITIVITIES ARE REQUIRED. Performed at [facility], [address] [place], [place] [address]    Report Status 02/13/2024 FINAL  Final  Blood Culture ID Panel (Reflexed)     Status: Abnormal   Collection Time: 02/10/24 11:25 PM  Result Value Ref Range Status   Enterococcus faecalis NOT DETECTED NOT DETECTED Final   Enterococcus Faecium NOT DETECTED NOT DETECTED Final   Listeria monocytogenes NOT DETECTED NOT DETECTED Final   Staphylococcus species DETECTED (A) NOT DETECTED Final    Comment: CRITICAL RESULT CALLED TO, READ BACK BY AND VERIFIED WITH: PHARMD E. REOME [id number] @ 1940 FH    Staphylococcus aureus (BCID) NOT DETECTED NOT DETECTED Final   Staphylococcus epidermidis DETECTED (A) NOT  DETECTED Final    Comment: Methicillin (oxacillin) resistant coagulase negative staphylococcus. Possible blood culture contaminant (unless isolated from more than one blood culture draw or clinical case suggests pathogenicity). No antibiotic treatment is indicated for blood  culture contaminants. CRITICAL RESULT CALLED TO, READ BACK BY AND VERIFIED WITH: PHARMD E. REOME [id number] @ 1940 FH    Staphylococcus lugdunensis NOT DETECTED NOT DETECTED Final   Streptococcus species NOT DETECTED NOT DETECTED Final   Streptococcus agalactiae NOT DETECTED NOT DETECTED Final   Streptococcus pneumoniae NOT DETECTED NOT DETECTED Final   Streptococcus pyogenes NOT DETECTED NOT DETECTED Final   A.calcoaceticus-baumannii NOT DETECTED NOT DETECTED Final   Bacteroides fragilis NOT DETECTED NOT DETECTED Final   Enterobacterales NOT DETECTED NOT DETECTED Final   Enterobacter cloacae complex NOT DETECTED NOT DETECTED Final   Escherichia coli NOT DETECTED NOT DETECTED Final   Klebsiella aerogenes NOT DETECTED NOT DETECTED Final   Klebsiella oxytoca NOT DETECTED NOT DETECTED Final   Klebsiella pneumoniae NOT DETECTED NOT DETECTED Final   Proteus species NOT DETECTED NOT DETECTED Final   Salmonella species NOT DETECTED NOT DETECTED Final   Serratia marcescens NOT DETECTED NOT DETECTED Final   Haemophilus influenzae NOT DETECTED NOT DETECTED Final   Neisseria meningitidis NOT DETECTED NOT DETECTED Final   Pseudomonas aeruginosa NOT DETECTED NOT DETECTED Final   Stenotrophomonas maltophilia NOT DETECTED NOT DETECTED Final   Candida albicans NOT DETECTED NOT DETECTED Final   Candida auris NOT DETECTED NOT DETECTED Final   Candida glabrata NOT DETECTED NOT DETECTED Final   Candida krusei NOT DETECTED NOT DETECTED Final   Candida parapsilosis NOT DETECTED NOT DETECTED Final   Candida tropicalis NOT DETECTED NOT DETECTED Final   Cryptococcus neoformans/gattii NOT DETECTED NOT DETECTED Final   Methicillin resistance  mecA/C DETECTED (A) NOT DETECTED Final    Comment: CRITICAL RESULT CALLED TO, READ BACK BY AND VERIFIED WITH: MAYA DRAFTS REOME [id number] @ 1940 FH Performed at [facility], [address] [place], [place] [address]     Please note: You were cared for by a hospitalist during your hospital stay. Once you are discharged, your primary care physician will handle any further medical issues. Please note that NO REFILLS for any discharge medications will be  authorized once you are discharged, as it is imperative that you return to your primary care physician (or establish a relationship with a primary care physician if you do not have one) for your post hospital discharge needs so that they can reassess your need for medications and monitor your lab values.    Time coordinating discharge: 40 minutes  SIGNED:   Ivonne Mustache, MD  [facility] 02/19/2024, 11:08 AM Pager [phone]  If 7PM-7AM, please contact night-coverage [email or web address] Password [email or web address]     [1]  Allergies Allergen Reactions   Daratumumab  Shortness Of Breath, Diarrhea, Itching and Hypertension    Only with IV daratumumab    Heparin  Other (See Comments)    For religious purposes, does not agree to Heparin  or other pork products   Porcine (Pork) Protein-Containing Drug Products     Religious reasons

## 2024-02-24 NOTE — Progress Notes (Incomplete)
 "  ADVANCED HF CLINIC CONSULT NOTE  Primary Care: Sim [name] CROME, MD Primary Cardiologist: None  HPI: Bradley Hunt is a 54 y.o. male with history of multiple myeloma s/p CAR-T 9/25 and stem cell transplant in 2018, chronic HFrEF, VT arrest. Follows with [facility].  Echo 10/25 at [facility] with EF 30-35%, nl RV  He presented to ED by EMS after having been found down by his friend in the restroom (no bystander CPR). EMS found him pulseless, CPR initiated and found to be in shockable rhythm. He was admitted to [facility] ICU. Initial K on arrival 2.8, he had been having diarrhea. Found to have norovirus, rotavirus, and covid. Suffered from encephalopathy, which slowly recovered over the course of admission. Echo 1/14 showed stable function EF 20-25%. R/LHC showed normal coronaries, low filling pressures and normal output by fick. Repeat echo 1/22 showed improved EF 40-45%, G1DD, nl RV. EP saw and recommended LifeVest at discharge with outpatient follow up.   He presents today to establish care with the HF Clinic. Overall feeling ***. NYHA ***. Reports {Symptoms; cardiac:12860::dyspnea,fatigue}. Denies {Symptoms; cardiac:12860::chest pain,dyspnea,fatigue,near-syncope,orthopnea,palpitations,dizziness,abnormal bleeding}. Able to perform ADLs. Appetite okay. Weight at home ***. BP at home***. Compliant with all medications.    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      acyclovir  (ZOVIRAX ) 400 MG tablet Take 2 tablets (800 mg total) by mouth 2 (two) times daily. 120 tablet 0   amiodarone  (PACERONE ) 200 MG tablet Take 1 tablet (200 mg total) by mouth 2 (two) times daily. 60 tablet 0   calcium  carbonate (TUMS - DOSED IN MG ELEMENTAL CALCIUM ) 500 MG chewable tablet Chew 1 tablet by mouth 3 (three) times daily.     carvedilol  (COREG ) 3.125 MG tablet Take 1 tablet (3.125 mg total) by mouth 2 (two) times daily with a meal. 60 tablet 0   cholecalciferol (VITAMIN D3) 25 MCG (1000 UT) tablet Take 1,000 Units by mouth daily.     entecavir  (BARACLUDE ) 0.5 MG tablet Take 1 tablet (0.5 mg total) by mouth daily. 30 tablet 0   folic acid (FOLVITE) 1 MG tablet Take 1 mg by mouth daily.     Multiple Vitamin (MULTI-VITAMIN) tablet Take 1 tablet by mouth daily.     ondansetron  (ZOFRAN ) 8 MG tablet Take 1 tablet (8 mg total) by mouth every 8 (eight) hours as needed for nausea or vomiting. 30 tablet 1   sacubitril -valsartan  (ENTRESTO ) 49-51 MG Take 1 tablet by mouth 2 (two) times daily. 60 tablet 0   spironolactone  (ALDACTONE ) 25 MG tablet Take 1 tablet (25 mg total) by mouth daily. 30 tablet 0   sulfamethoxazole -trimethoprim  (BACTRIM  DS) 800-160 MG tablet Take 1 tablet by mouth 3 (three) times a week. 12 tablet 0   traMADol (ULTRAM) 50 MG tablet Take 50 mg by mouth every 8 (eight) hours as needed for severe pain (pain score 7-10).  No current facility-administered medications for this visit.    Allergies[1]    Social History   Socioeconomic History   Marital status: Married    Spouse name: Not on file   Number of children: 0   Years of education: Not on file   Highest education level: Not on file  Occupational History   Not on file  Tobacco Use   Smoking status: Former    Current packs/day: 0.00    Average packs/day: 1 pack/day for 10.0 years (10.0 ttl pk-yrs)    Types: Cigarettes    Start date: 01/29/1987    Quit date: 01/28/1997    Years since quitting: 27.0    Smokeless tobacco: Never  Vaping Use   Vaping status: Never Used  Substance and Sexual Activity   Alcohol use: No   Drug use: No   Sexual activity: Yes    Comment: friend Amadou next of kin. 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  Other Topics Concern   Not on file  Social History Narrative   Not on file   Social Drivers of Health   Tobacco Use: Medium Risk (02/14/2024)   Patient History    Smoking Tobacco Use: Former    Smokeless Tobacco Use: Never    Passive Exposure: Not on Actuary Strain: Not on file  Food Insecurity: No Food Insecurity (02/11/2024)   Epic    Worried About Programme Researcher, Broadcasting/film/video in the Last Year: Never true    Ran Out of Food in the Last Year: Never true  Transportation Needs: No Transportation Needs (02/11/2024)   Epic    Lack of Transportation (Medical): No    Lack of Transportation (Non-Medical): No  Physical Activity: Not on file  Stress: Not on file  Social Connections: Socially Integrated (02/11/2024)   Social Connection and Isolation Panel    Frequency of Communication with Friends and Family: More than three times a week    Frequency of Social Gatherings with Friends and Family: More than three times a week    Attends Religious Services: 1 to 4 times per year    Active Member of [employer] or Organizations: Yes    Attends Banker Meetings: More than 4 times per year    Marital Status: Living with partner  Intimate Partner Violence: Not At Risk (02/11/2024)   Epic    Fear of Current or Ex-Partner: No    Emotionally Abused: No    Physically Abused: No    Sexually Abused: No  Depression (PHQ2-9): Low Risk (11/04/2023)   Depression (PHQ2-9)    PHQ-2 Score: 0  Alcohol Screen: Not on file  Housing: Low Risk (02/11/2024)   Epic    Unable to Pay for Housing in the Last Year: No    Number of Times Moved in the Last Year: 0    Homeless in the Last Year: No  Utilities: Not At Risk (02/11/2024)   Epic    Threatened with loss of  utilities: No  Health Literacy: Not on file      Family History  Problem Relation Age of Onset   Cancer Neg Hx     There were no vitals filed for this visit.  PHYSICAL EXAM: General: *** appearing. No distress  Cardiac: JVP ~*** cm. No murmurs  Resp: Lung sounds clear and equal B/L Abdomen: Soft, non-distended.  Extremities: Warm and dry.  *** edema.  Neuro: A&O x3. Affect pleasant.    ECG:   ASSESSMENT & PLAN:  Chronic HFrEF:  - Known EF 35-40% in 10/25. - Echo read as 20-25% (30-35% on Dr. Charley read which is his baseline) - Initial concern for severe anoxic injury but extubated 1/16 and mental status improving but not back to baseline - Cath 1/20 no CAD. EF ?45-50%. RHC numbers were ok.  - repeat echo 02/19/24 EF 40-45% - continue coreg  3.125 mg bid - continue entresto  49/51 mg bid - continue spiro 25 gm daily - hold on SGLT2i with low volume status for now - LifeVest  H/o VT/VF Arrest - 2/2 acute illness (norovirus, rhinovirus, and COVID) and hypokalemia 2.8 - neurologically improving - wearing LifeVest - EP follow up ***    Multiple Myeloma with Spinal METS - s/p chemo and stem cell transplant 2018 - recent recurrence s/p recent CAR-T and fludarabine /cytoxan  lymphodepletion at [facility] - on bactrim /acyclovir  for ppx after tx   Encephalopathy -    PVCs - continue amio   Follow up in *** with ***  Mcihael Hinderman, NP 02/24/24     [1]  Allergies Allergen Reactions   Daratumumab  Shortness Of Breath, Diarrhea, Itching and Hypertension    Only with IV daratumumab    Heparin  Other (See Comments)    For religious purposes, does not agree to Heparin  or other pork products   Porcine (Pork) Protein-Containing Drug Products     Religious reasons   "

## 2024-02-25 ENCOUNTER — Telehealth (HOSPITAL_COMMUNITY): Payer: Self-pay

## 2024-02-25 NOTE — Telephone Encounter (Signed)
 Called to confirm/remind patient of their appointment at the [facility] on 02/26/24.   Appointment:   [x] Confirmed  [] Left mess   [] No answer/No voice mail  [] VM Full/unable to leave message  [] Phone not in service  Patient reminded to bring all medications and/or complete list.  Confirmed patient has transportation. Gave directions, instructed to utilize valet parking.

## 2024-02-26 ENCOUNTER — Ambulatory Visit (HOSPITAL_COMMUNITY)
Admit: 2024-02-26 | Discharge: 2024-02-26 | Disposition: A | Discharge status: Home / Self Care | Attending: Cardiology | Admitting: Cardiology

## 2024-02-26 ENCOUNTER — Telehealth (HOSPITAL_COMMUNITY): Payer: Self-pay

## 2024-02-26 ENCOUNTER — Ambulatory Visit (HOSPITAL_COMMUNITY): Payer: Self-pay | Admitting: Cardiology

## 2024-02-26 ENCOUNTER — Other Ambulatory Visit (HOSPITAL_COMMUNITY): Payer: Self-pay

## 2024-02-26 ENCOUNTER — Encounter (HOSPITAL_COMMUNITY): Payer: Self-pay

## 2024-02-26 VITALS — BP 123/88 | HR 85 | Ht 74.0 in | Wt 230.0 lb

## 2024-02-26 DIAGNOSIS — C9001 Multiple myeloma in remission: Secondary | ICD-10-CM

## 2024-02-26 DIAGNOSIS — Z79899 Other long term (current) drug therapy: Secondary | ICD-10-CM | POA: Diagnosis not present

## 2024-02-26 DIAGNOSIS — I493 Ventricular premature depolarization: Secondary | ICD-10-CM | POA: Insufficient documentation

## 2024-02-26 DIAGNOSIS — Z87891 Personal history of nicotine dependence: Secondary | ICD-10-CM | POA: Insufficient documentation

## 2024-02-26 DIAGNOSIS — I469 Cardiac arrest, cause unspecified: Secondary | ICD-10-CM | POA: Diagnosis not present

## 2024-02-26 DIAGNOSIS — Z9484 Stem cells transplant status: Secondary | ICD-10-CM | POA: Diagnosis not present

## 2024-02-26 DIAGNOSIS — I472 Ventricular tachycardia, unspecified: Secondary | ICD-10-CM

## 2024-02-26 DIAGNOSIS — C9000 Multiple myeloma not having achieved remission: Secondary | ICD-10-CM | POA: Insufficient documentation

## 2024-02-26 DIAGNOSIS — Z8674 Personal history of sudden cardiac arrest: Secondary | ICD-10-CM | POA: Insufficient documentation

## 2024-02-26 DIAGNOSIS — I4901 Ventricular fibrillation: Secondary | ICD-10-CM | POA: Diagnosis not present

## 2024-02-26 DIAGNOSIS — Z9221 Personal history of antineoplastic chemotherapy: Secondary | ICD-10-CM | POA: Insufficient documentation

## 2024-02-26 DIAGNOSIS — I5022 Chronic systolic (congestive) heart failure: Secondary | ICD-10-CM | POA: Diagnosis present

## 2024-02-26 DIAGNOSIS — R0789 Other chest pain: Secondary | ICD-10-CM | POA: Insufficient documentation

## 2024-02-26 LAB — BASIC METABOLIC PANEL WITH GFR
Anion gap: 11 (ref 5–15)
BUN: 12 mg/dL (ref 6–20)
CO2: 26 mmol/L (ref 22–32)
Calcium: 9.7 mg/dL (ref 8.9–10.3)
Chloride: 102 mmol/L (ref 98–111)
Creatinine, Ser: 1.15 mg/dL (ref 0.61–1.24)
GFR, Estimated: >60 mL/min
Glucose, Bld: 89 mg/dL (ref 70–99)
Potassium: 4 mmol/L (ref 3.5–5.1)
Sodium: 139 mmol/L (ref 135–145)

## 2024-02-26 LAB — PRO BRAIN NATRIURETIC PEPTIDE: Pro Brain Natriuretic Peptide: 154 pg/mL

## 2024-02-26 MED ORDER — EMPAGLIFLOZIN 10 MG PO TABS: 10.0000 mg | ORAL_TABLET | Freq: Every day | ORAL | 5 refills | Status: AC

## 2024-02-26 NOTE — Telephone Encounter (Signed)
 Advanced Heart Failure Patient Advocate Encounter  Test billing for this patient's current coverage ([employer]) returns a $12.54 copay for 90 day supply of Jardiance .  This test claim was processed through [facility]- copay amounts may vary at other pharmacies due to pharmacy/plan contracts, or as the patient moves through the different stages of their insurance plan.  Rachel DEL, CPhT Rx Patient Advocate Phone: [phone]

## 2024-02-26 NOTE — Patient Instructions (Addendum)
 Medication Changes:  START JARDIANCE  10MG  ONCE DAILY--- PLEASE LET US  KNOW IF ANY ISSUES WITH THIS   Lab Work:  Labs done today, your results will be available in MyChart, we will contact you for abnormal readings.   Testing/Procedures:  Your physician has requested that you have an echocardiogram. Echocardiography is a painless test that uses sound waves to create images of your heart. It provides your doctor with information about the size and shape of your heart and how well your hearts chambers and valves are working. This procedure takes approximately one hour. There are no restrictions for this procedure. Please do NOT wear cologne, perfume, aftershave, or lotions (deodorant is allowed). Please arrive 15 minutes prior to your appointment time.  Please note: We ask at that you not bring children with you during ultrasound (echo/ vascular) testing. Due to room size and safety concerns, children are not allowed in the ultrasound rooms during exams. Our front office staff cannot provide observation of children in our lobby area while testing is being conducted. An adult accompanying a patient to their appointment will only be allowed in the ultrasound room at the discretion of the ultrasound technician under special circumstances. We apologize for any inconvenience.   Referrals:   no   Follow-Up in: 2 MONTHS WITH AN ECHO AS SCHEDULED   At the [facility], you and your health needs are our priority. We have a designated team specialized in the treatment of Heart Failure. This Care Team includes your primary Heart Failure Specialized Cardiologist (physician), Advanced Practice Providers (APPs- Physician Assistants and Nurse Practitioners), and Pharmacist who all work together to provide you with the care you need, when you need it.   You may see any of the following providers on your designated Care Team at your next follow up:  Dr. Toribio Fuel Dr. Ezra Shuck Dr. Odis Brownie Greig Mosses, NP Caffie Shed, [place] [facility] [place], [place] Beckey Coe, NP [name], NP Tinnie Redman, PharmD   Please be sure to bring in all your medications bottles to every appointment.   Need to Contact Us :  If you have any questions or concerns before your next appointment please send us  a message through [place] or call our office at [phone].    TO LEAVE A MESSAGE FOR THE NURSE SELECT OPTION 2, PLEASE LEAVE A MESSAGE INCLUDING: YOUR NAME DATE OF BIRTH CALL BACK NUMBER REASON FOR CALL**this is important as we prioritize the call backs  YOU WILL RECEIVE A CALL BACK THE SAME DAY AS LONG AS YOU CALL BEFORE 4:00 PM

## 2024-03-03 ENCOUNTER — Ambulatory Visit: Admitting: Physical Therapy

## 2024-03-03 VITALS — BP 135/78 | HR 85

## 2024-03-03 DIAGNOSIS — M6281 Muscle weakness (generalized): Secondary | ICD-10-CM

## 2024-03-03 DIAGNOSIS — M542 Cervicalgia: Secondary | ICD-10-CM

## 2024-03-03 DIAGNOSIS — R2681 Unsteadiness on feet: Secondary | ICD-10-CM

## 2024-03-03 DIAGNOSIS — R2689 Other abnormalities of gait and mobility: Secondary | ICD-10-CM

## 2024-03-03 DIAGNOSIS — M25511 Pain in right shoulder: Secondary | ICD-10-CM

## 2024-03-03 NOTE — Therapy (Signed)
 " OUTPATIENT PHYSICAL THERAPY NEURO EVALUATION   Patient Name: Bradley Hunt MRN: [id number] DOB:May 20, 1970, 54 y.o., male Today's Date: 03/03/2024   PCP: Sim [name] CROME, MD REFERRING PROVIDER: Jillian Buttery, MD  END OF SESSION:  PT End of Session - 03/03/24 1143     Visit Number 1    Number of Visits 1    Date for Recertification  03/03/24   eval only   Authorization Type [employer]    PT Start Time 1142    PT Stop Time 1219    PT Time Calculation (min) 37 min    Activity Tolerance Patient tolerated treatment well    Behavior During Therapy [facility] for tasks assessed/performed          Past Medical History:  Diagnosis Date   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   History of chemotherapy    History of radiation therapy    Multiple myeloma not having achieved remission (HCC) 06/25/2015   Numbness    lower extermities bilat    Past Surgical History:  Procedure Laterality Date   ANTERIOR CRUCIATE LIGAMENT REPAIR     IR FLUORO GUIDE PORT INSERTION RIGHT  05/16/2016   IR REMOVAL TUN ACCESS W/ PORT W/O FL MOD SED  07/23/2017   IR US  GUIDE VASC ACCESS RIGHT  05/16/2016   LAMINECTOMY N/A 06/25/2015   Procedure: Thoracic six LAMINECTOMY RESECTION FOR TUMOR;  Surgeon: Gerldine Maizes, MD;  Location: [facility] NEURO ORS;  Service: Neurosurgery;  Laterality: N/A;   RIGHT/LEFT HEART CATH AND CORONARY ANGIOGRAPHY N/A 02/17/2024   Procedure: RIGHT/LEFT HEART CATH AND CORONARY ANGIOGRAPHY;  Surgeon: Cherrie Toribio SAUNDERS, MD;  Location: [facility] INVASIVE CV LAB;  Service: Cardiovascular;  Laterality: N/A;   Patient Active Problem List   Diagnosis Date Noted   On mechanically assisted ventilation (HCC) 02/11/2024   Acute HFrEF (heart failure with reduced ejection fraction) (HCC) 02/11/2024   Cardiac arrest (HCC) 02/10/2024   Cardiomyopathy (HCC) 09/22/2023   Port-A-Cath in place 05/27/2023   Acneiform rash 05/27/2023   Anemia due to antineoplastic chemotherapy 04/15/2023   New onset of  headaches 12/18/2022   Thrombocytopenia 11/19/2022   Sinusitis 06/10/2022   Itching due to drug 12/05/2021   Non-compliance 07/23/2021   Herniated intervertebral disc of lumbar spine 03/27/2021   Compression fracture of body of thoracic vertebra (HCC) 03/23/2021   Essential hypertension 05/02/2020   Fever 01/24/2020   Infertility male 10/25/2019   Preventive measure 10/25/2019   Abdominal bloating 07/27/2019   Class 1 obesity due to excess calories in adult 07/27/2019   Chronic back pain greater than 3 months duration 01/19/2018   Encounter for screening for HIV 05/12/2017   Pancytopenia, acquired (HCC) 07/17/2016   Goals of care, counseling/discussion 05/10/2016   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   Constipation, chronic 11/03/2015   Drug-induced neutropenia 10/10/2015   Elevated serum creatinine 07/28/2015   Multiple myeloma in relapse (HCC) 07/18/2015   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 Chest soreness likely from when he had CPR performed and R-side pain could be from his fall at work when he became unconscious. Pt reports that occasionally he will feel mildly unsteady, not all the time. He does feel as if his energy levels are decreased  and he gets tired quicker.  He has not returned to work yet, does not have a return date set yet. He reports he initially had some impaired short-term memory but his memory has been improving.  Pt accompanied by: self, drove himself today  PERTINENT HISTORY: PMH includes: multiple myeloma, previous bony metastasis, chemo and radiation therapy, and previous pancytopenia  From acute care PT note 02/18/24: The pt is a 54 yo male presenting 1/13 after being found unresponsive by friends at work, CPR by EMS with ROSC after 8 min, unknown total downtime. Intubated on arrival, admission complicated by recurrent posturing episodes vs seizure? (EEG negative) as well as testing positive for Covid, Norovirus, and rotavirus. Extubated 1/16. PMH includes: multiple myeloma, previous bony metastasis, chemo and radiation therapy, and previous pancytopenia.  PAIN:  Are you having pain? Yes: NPRS scale: 6/10 Pain location: chest, R lower neck into R shoulder and upper arm Pain description: soreness Aggravating factors: breathing too deeply (chest) Relieving factors: N/A  PRECAUTIONS: Fall  RED FLAGS: None   WEIGHT BEARING RESTRICTIONS: No  FALLS: Has patient fallen in last 6 months? No  LIVING ENVIRONMENT: Lives with: lives with their family Lives in: House/apartment Stairs: Yes: External: 4-5 steps; on right going up Has following equipment at home: None  PLOF: Independent  PATIENT GOALS: improve strength, balance, endurance  OBJECTIVE:  Note: Objective measures were completed at Evaluation unless otherwise noted.  DIAGNOSTIC FINDINGS:   Head and Neck CT/CTA 02/10/2024 IMPRESSION: CT HEAD:   No acute intracranial abnormality.   CTA HEAD AND NECK:   1. Negative CTA of the head and neck. No large vessel occlusion or other emergent finding. No hemodynamically significant or correctable stenosis. 2. Multiple lucent lesions throughout the visualized osseous structures, consistent with  history of multiple myeloma. 3. Endotracheal and enteric tubes in place. The balloon for the endotracheal tube appears over inflated at the supraglottic larynx. Bedside check and adjustment recommended. 4. Right lower lobe atelectasis and/or consolidation.  COGNITION: Overall cognitive status: Within functional limits for tasks assessed Initial mild memory impairments, improving per patient   SENSATION: N/T from L wrist into hand from IV  COORDINATION: WFL BLE  EDEMA:  None currently  POSTURE: No Significant postural limitations  UPPER EXTREMITY ROM:  Active ROM Right eval Left eval  Shoulder flexion Pain at end range in RUT and lats [facility]  Shoulder extension    Shoulder abduction Pain at end range in R UT and lats [facility]  Shoulder adduction    Shoulder extension    Shoulder internal rotation    Shoulder external rotation    Elbow flexion    Elbow extension    Wrist flexion    Wrist extension    Wrist ulnar deviation    Wrist radial deviation    Wrist pronation    Wrist supination     (Blank rows = not tested)   UPPER EXTREMITY MMT:  MMT Right eval Left eval  Shoulder flexion 3+ (pain) 5  Shoulder extension    Shoulder abduction 3+ (pain) 5  Shoulder adduction    Shoulder extension  Shoulder internal rotation    Shoulder external rotation    Middle trapezius    Lower trapezius    Elbow flexion 5 5  Elbow extension 5 5  Wrist flexion    Wrist extension    Wrist ulnar deviation    Wrist radial deviation    Wrist pronation    Wrist supination    Grip strength WFL WFL   (Blank rows = not tested)   LOWER EXTREMITY ROM:   WFL  Active  Right Eval Left Eval  Hip flexion    Hip extension    Hip abduction    Hip adduction    Hip internal rotation    Hip external rotation    Knee flexion    Knee extension    Ankle dorsiflexion    Ankle plantarflexion    Ankle inversion    Ankle eversion     (Blank rows = not tested)  LOWER EXTREMITY MMT:     MMT Right Eval Left Eval  Hip flexion 5 4+  Hip extension    Hip abduction    Hip adduction    Hip internal rotation    Hip external rotation    Knee flexion 5 5  Knee extension 5 5  Ankle dorsiflexion 5 5  Ankle plantarflexion    Ankle inversion    Ankle eversion    (Blank rows = not tested)  BED MOBILITY:  Independent per patient report  TRANSFERS: Sit to stand: Complete Independence  Assistive device utilized: None     Stand to sit: Complete Independence  Assistive device utilized: None     Chair to chair: Complete Independence  Assistive device utilized: None       RAMP:  Not tested  CURB:  Not tested  STAIRS: STAIRS:  Level of Assistance: Modified independence  Stair Negotiation Technique: Alternating Pattern  with No Rails  Number of Stairs: 4   Height of Stairs: 6  Comments: WFL    GAIT: Findings:  Gait pattern: WFL Distance walked: various clinic distances Assistive device utilized: None Level of assistance: Complete Independence Comments: WFL  FUNCTIONAL TESTS:    [facility] PT Assessment - 03/03/24 1213       Ambulation/Gait   Gait velocity 32.8 ft over 9.59 sec = 3.42 ft/sec   no AD     6 minute walk test results    Aerobic Endurance Distance Walked 1400   no fatigue at end of test     Standardized Balance Assessment   Standardized Balance Assessment Timed Up and Go Test;Five Times Sit to Stand    Five times sit to stand comments  21.4 sec   no UE     Timed Up and Go Test   TUG Normal TUG    Normal TUG (seconds) 8.06   no AD     Functional Gait  Assessment   Gait assessed  Yes    Gait Level Surface Walks 20 ft in less than 7 sec but greater than 5.5 sec, uses assistive device, slower speed, mild gait deviations, or deviates 6-10 in outside of the 12 in walkway width.    Change in Gait Speed Able to smoothly change walking speed without loss of balance or gait deviation. Deviate no more than 6 in outside of the 12 in walkway width.     Gait with Horizontal Head Turns Performs head turns smoothly with slight change in gait velocity (eg, minor disruption to smooth gait path), deviates 6-10 in outside 12 in  walkway width, or uses an assistive device.    Gait with Vertical Head Turns Performs head turns with no change in gait. Deviates no more than 6 in outside 12 in walkway width.    Gait and Pivot Turn Pivot turns safely within 3 sec and stops quickly with no loss of balance.    Step Over Obstacle Is able to step over 2 stacked shoe boxes taped together (9 in total height) without changing gait speed. No evidence of imbalance.    Gait with Narrow Base of Support Is able to ambulate for 10 steps heel to toe with no staggering.    Gait with Eyes Closed Walks 20 ft, no assistive devices, good speed, no evidence of imbalance, normal gait pattern, deviates no more than 6 in outside 12 in walkway width. Ambulates 20 ft in less than 7 sec.    Ambulating Backwards Walks 20 ft, no assistive devices, good speed, no evidence for imbalance, normal gait    Steps Alternating feet, no rail.    Total Score 28    FGA comment: 28/30           PATIENT SURVEYS:  None assessed at eval                                                                                                                              TREATMENT DATE: PT Evaluation  Self-Care Vitals:   03/03/24 1153  BP: 135/78  Pulse: 85  Assessed in RUE while seated at rest. Vitals WNL. Reviewed normal vitals range with patient.  TherEx To address complaints of R lower neck and upper shoulder pain: Seated R UT stretch 3 x 30 sec Seated R levator scap stretch 3 x 30 sec   Added to HEP, see bolded below    PATIENT EDUCATION: Education details: Eval findings, results of OM and functional implications, stretches to address pain, see above regarding BP Person educated: Patient Education method: Explanation, Demonstration, and Handouts Education comprehension: verbalized  understanding and returned demonstration  HOME EXERCISE PROGRAM: Access Code: [email or web address] URL: [email or web address] Date: 03/03/2024 Prepared by: Waddell Southgate  Exercises - Seated Upper Trapezius Stretch  - 1 x daily - 7 x weekly - 1 sets - 3-5 reps - 30-60 sec hold - Seated Levator Scapulae Stretch  - 1 x daily - 7 x weekly - 1 sets - 3-5 reps - 30-60 sec hold  GOALS: no goals set due to one-time visit/eval   ASSESSMENT:  CLINICAL IMPRESSION: Patient is a 54 year old male referred to Neuro OPPT for impairments related to his recent cardiac arrest.   Pt's PMH is significant for: multiple myeloma, previous bony metastasis, chemo and radiation therapy, and previous pancytopenia. The following deficits were present during the exam: mildly decreased LE strength, decreased R shoulder strength due to increased muscle pain, and mildly impaired balance. Based on his gait speed of 3.42 ft/sec, TUG score of 8.06, FGA score of 28/30, and  distance of 1400 ft, pt does not need skilled PT services at this time as he is close to his baseline and his scores are WNL and WFL. Provided stretches for upper trap and levator scap as pt does have ongoing soreness in these areas likely due to a fall that occurred when he went into cardiac arrest. Educated patient that if things change he can obtain a new referral to return to OPPT services in the future if needed and also can pursue Speech Therapy services if memory issues do not continue to improve.  OBJECTIVE IMPAIRMENTS: decreased strength, impaired UE functional use, improper body mechanics, postural dysfunction, and pain.   ACTIVITY LIMITATIONS: carrying, lifting, and reach over head  PARTICIPATION LIMITATIONS: occupation  PERSONAL FACTORS: 1-2 comorbidities:   PMH includes: multiple myeloma, previous bony metastasis, chemo and radiation therapy, and previous pancytopeniaare also affecting patient's functional outcome.   CLINICAL DECISION  MAKING: Stable/uncomplicated  EVALUATION COMPLEXITY: Low  PLAN:  PT FREQUENCY: one time visit  PT DURATION: 1 sessions (eval only)    Waddell Southgate, PT Waddell Southgate, PT, DPT, CSRS  03/03/2024, 1:11 PM        "

## 2024-03-23 ENCOUNTER — Ambulatory Visit: Admitting: Cardiovascular Disease

## 2024-04-26 ENCOUNTER — Ambulatory Visit (HOSPITAL_COMMUNITY)

## 2024-04-26 ENCOUNTER — Other Ambulatory Visit (HOSPITAL_COMMUNITY)
